# Patient Record
Sex: Male | Born: 1935
Health system: Southern US, Community
[De-identification: ages and names within clinical notes are randomized; demographics above are authoritative.]

## PROBLEM LIST (undated history)

## (undated) DIAGNOSIS — N183 Chronic kidney disease, stage 3 unspecified: Secondary | ICD-10-CM

## (undated) DIAGNOSIS — C20 Malignant neoplasm of rectum: Secondary | ICD-10-CM

## (undated) DIAGNOSIS — N200 Calculus of kidney: Secondary | ICD-10-CM

## (undated) DIAGNOSIS — I219 Acute myocardial infarction, unspecified: Secondary | ICD-10-CM

## (undated) DIAGNOSIS — C649 Malignant neoplasm of unspecified kidney, except renal pelvis: Secondary | ICD-10-CM

## (undated) DIAGNOSIS — M1990 Unspecified osteoarthritis, unspecified site: Secondary | ICD-10-CM

## (undated) DIAGNOSIS — E039 Hypothyroidism, unspecified: Secondary | ICD-10-CM

## (undated) DIAGNOSIS — C61 Malignant neoplasm of prostate: Secondary | ICD-10-CM

## (undated) DIAGNOSIS — I4510 Unspecified right bundle-branch block: Secondary | ICD-10-CM

## (undated) DIAGNOSIS — D649 Anemia, unspecified: Secondary | ICD-10-CM

## (undated) DIAGNOSIS — K222 Esophageal obstruction: Secondary | ICD-10-CM

## (undated) DIAGNOSIS — I350 Nonrheumatic aortic (valve) stenosis: Secondary | ICD-10-CM

## (undated) DIAGNOSIS — I10 Essential (primary) hypertension: Secondary | ICD-10-CM

## (undated) DIAGNOSIS — R0600 Dyspnea, unspecified: Secondary | ICD-10-CM

## (undated) DIAGNOSIS — M810 Age-related osteoporosis without current pathological fracture: Secondary | ICD-10-CM

## (undated) DIAGNOSIS — I340 Nonrheumatic mitral (valve) insufficiency: Secondary | ICD-10-CM

## (undated) DIAGNOSIS — I2510 Atherosclerotic heart disease of native coronary artery without angina pectoris: Secondary | ICD-10-CM

## (undated) HISTORY — DX: Malignant neoplasm of unspecified kidney, except renal pelvis: C64.9

## (undated) HISTORY — PX: HERNIA REPAIR: SHX51

## (undated) HISTORY — DX: Acute myocardial infarction, unspecified: I21.9

## (undated) HISTORY — PX: PROSTATECTOMY: SHX69

## (undated) HISTORY — DX: Unspecified osteoarthritis, unspecified site: M19.90

## (undated) HISTORY — PX: PERCUTANEOUS CORONARY INTERVENTION-BALLOON ONLY: SHX6014

## (undated) HISTORY — DX: Atherosclerotic heart disease of native coronary artery without angina pectoris: I25.10

## (undated) HISTORY — DX: Calculus of kidney: N20.0

## (undated) HISTORY — DX: Esophageal obstruction: K22.2

## (undated) HISTORY — DX: Age-related osteoporosis without current pathological fracture: M81.0

---

## 1967-10-22 DIAGNOSIS — N200 Calculus of kidney: Secondary | ICD-10-CM

## 1967-10-22 HISTORY — DX: Calculus of kidney: N20.0

## 1984-10-21 DIAGNOSIS — C20 Malignant neoplasm of rectum: Secondary | ICD-10-CM

## 1984-10-21 HISTORY — DX: Malignant neoplasm of rectum: C20

## 1984-10-21 HISTORY — PX: COLOSTOMY: SHX63

## 1985-10-21 DIAGNOSIS — C61 Malignant neoplasm of prostate: Secondary | ICD-10-CM

## 1985-10-21 HISTORY — DX: Malignant neoplasm of prostate: C61

## 1998-04-28 ENCOUNTER — Ambulatory Visit (HOSPITAL_COMMUNITY): Admission: RE | Admit: 1998-04-28 | Discharge: 1998-04-28 | Payer: Self-pay | Admitting: Gastroenterology

## 1998-08-02 ENCOUNTER — Encounter: Admission: RE | Admit: 1998-08-02 | Discharge: 1998-10-31 | Payer: Self-pay | Admitting: *Deleted

## 2002-08-18 ENCOUNTER — Encounter: Payer: Self-pay | Admitting: Urology

## 2002-08-25 ENCOUNTER — Observation Stay (HOSPITAL_COMMUNITY): Admission: RE | Admit: 2002-08-25 | Discharge: 2002-08-26 | Payer: Self-pay | Admitting: Urology

## 2002-10-21 HISTORY — PX: KIDNEY SURGERY: SHX687

## 2003-04-04 ENCOUNTER — Encounter: Payer: Self-pay | Admitting: Urology

## 2003-04-04 ENCOUNTER — Observation Stay (HOSPITAL_COMMUNITY): Admission: EM | Admit: 2003-04-04 | Discharge: 2003-04-05 | Payer: Self-pay | Admitting: Emergency Medicine

## 2003-04-04 ENCOUNTER — Encounter: Payer: Self-pay | Admitting: Emergency Medicine

## 2003-04-21 ENCOUNTER — Ambulatory Visit (HOSPITAL_BASED_OUTPATIENT_CLINIC_OR_DEPARTMENT_OTHER): Admission: RE | Admit: 2003-04-21 | Discharge: 2003-04-21 | Payer: Self-pay | Admitting: Urology

## 2003-04-21 ENCOUNTER — Encounter: Payer: Self-pay | Admitting: Urology

## 2003-05-23 ENCOUNTER — Inpatient Hospital Stay (HOSPITAL_COMMUNITY): Admission: RE | Admit: 2003-05-23 | Discharge: 2003-05-27 | Payer: Self-pay | Admitting: Urology

## 2003-05-23 ENCOUNTER — Encounter (INDEPENDENT_AMBULATORY_CARE_PROVIDER_SITE_OTHER): Payer: Self-pay | Admitting: Specialist

## 2003-10-22 DIAGNOSIS — C649 Malignant neoplasm of unspecified kidney, except renal pelvis: Secondary | ICD-10-CM

## 2003-10-22 HISTORY — DX: Malignant neoplasm of unspecified kidney, except renal pelvis: C64.9

## 2004-10-21 HISTORY — PX: RADIOFREQUENCY ABLATION KIDNEY: SHX2292

## 2005-10-21 HISTORY — PX: OTHER SURGICAL HISTORY: SHX169

## 2008-09-14 ENCOUNTER — Ambulatory Visit: Payer: Self-pay | Admitting: Cardiology

## 2008-09-22 ENCOUNTER — Ambulatory Visit: Payer: Self-pay

## 2011-03-05 NOTE — Assessment & Plan Note (Signed)
[facility]                            CARDIOLOGY OFFICE NOTE   Frederick Davis, Frederick Davis                    MRN:          [id number]  DATE:09/14/2008                            DOB:          26-Aug-1923    PRIMARY CARE PHYSICIAN:  Ernestina Penna, MD   REASON FOR PRESENTATION:  Evaluate the patient with cardiovascular risk  factors.   HISTORY OF PRESENT ILLNESS:  The patient is a pleasant 77 year old  gentleman without prior cardiac history.  He does have significant  cardiovascular risk factors.  He is not particularly active.  He does  have some limitations with joint complaints.  However, he does walk when  he golfs.  He will do his chores of daily living and keep up with his  wife when they are out.  With this level of activity, he does not get  any chest pressure, neck or arm discomfort.  He does not have any  palpitations, presyncope, or syncope.  He has no PND or orthopnea.  His  predominant health problems have been cancer.   PAST MEDICAL HISTORY:  Hypertension x10 years, renal cell cancer, rectal  cancer, skin cancer, prostate cancer, nephrolithiasis, renal  insufficiency, and osteoporosis.   PAST SURGICAL HISTORY:  Renal cell cancer status post right nephrectomy,  prostatectomy, rectal cancer status post ostomy, penile implant, hernia  repair.   ALLERGIES:  None.   MEDICATIONS:  1. Norvasc 10 mg daily.  2. Levoxyl 50 mcg daily.  3. Tylenol.  4. Osteo Bi-Flex.  5. Multivitamin.   SOCIAL HISTORY:  The patient is retired.  He is married.  He has four  children.  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   FAMILY HISTORY:  Contributory for his father dying of an aortic aneurysm  at age 83.  There is a long history of hypertension.   REVIEW OF SYSTEMS:  As stated in the HPI, positive for reflux.  Negative  for all other systems.   PHYSICAL EXAMINATION:  GENERAL:  The patient is pleasant and in no  distress.  VITAL SIGNS:  Blood pressure  112/70, heart rate 67 and regular, weight  200 pounds, body mass index 29.  HEENT:  Eyes unremarkable.  Pupils equal, round, and reactive to light.  Fundi within normal limits.  Oral mucosa unremarkable.  NECK:  No  jugular venous distention at 45 degrees.  Carotid upstroke brisk and  symmetrical.  No bruits, no thyromegaly.  LYMPHATICS:  No cervical, axillary, or inguinal adenopathy.  LUNGS:  Clear to auscultation bilaterally.  BACK:  No costovertebral angle tenderness.  CHEST:  Unremarkable.  HEART:  PMI not displaced or sustained.  S1, S2 within normal limits.  No S3, no S4.  No clicks, no rubs, no murmurs.  ABDOMEN:  Flat, positive  bowel sounds normal in frequency and pitch.  No midline pulsatile mass.  Positive midline bruit.  No splenomegaly, no hepatomegaly.  SKIN:  No rashes, no nodules.  EXTREMITIES:  2+ pulses throughout.  No edema, no cyanosis, no clubbing.  NEUROLOGIC:  Oriented to person, place, and time.  Cranial nerves II  through XII grossly intact.  Motor grossly intact.   EKG, sinus rhythm, rate 67, axis within normal limits, intervals within  normal limits, no acute ST-T wave changes.   ASSESSMENT AND PLAN:  1. Hypertension.  The patient's blood pressure is well controlled.  At      this point, no further cardiovascular testing or no further change      to medications is required.  He will continue with the regimen as      listed.  2. Dyslipidemia.  We took a long time to discuss a recent lipid      profile.  I actually gave him an extra copy of his lipids.  He did      have an LDL of 127, though his HDL is 47.  We discussed the risks      and benefits of taking a statin and he will further discuss this      with Dr. Christell Constant.  3. Abdominal bruit.  The patient has this as well as a family history.      He will get an abdominal ultrasound to rule out aneurysm.  4. Hypothyroidism per Dr. Christell Constant.  He recently had his dose adjusted as      his TSH was slightly  elevated.  5. Followup.  I do not think the patient needs any further      cardiovascular testing at this point.  He needs aggressive primary      risk reduction and I will be happy to see him back at any time.     Rollene Rotunda, MD, [facility]  Electronically Signed    JH/MedQ  DD: 09/14/2008  DT: 09/15/2008  Job #: [id number]   cc:   Ernestina Penna, [name]

## 2011-03-08 NOTE — Discharge Summary (Signed)
   NAME:  Frederick Davis, Frederick Davis NO.:  [id number]   MEDICAL RECORD NO.:  [id number]                   PATIENT TYPE:  INP   LOCATION:  0354                                 FACILITY:  [facility]   PHYSICIAN:  Bertram Millard. Dahlstedt, [name]          DATE OF BIRTH:  01/23/1923   DATE OF ADMISSION:  05/23/2003  DATE OF DISCHARGE:  05/27/2003                                 DISCHARGE SUMMARY   PRINCIPAL DIAGNOSES:  1. Renal cell carcinoma.  2. History of rectal carcinoma.  3. History of prostate carcinoma.  4. ED status post IPP placement in 1990.  5. Arthritis.   PRINCIPAL PROCEDURE:  Right nephrectomy, laparoscopic, hand-assisted.   BRIEF HISTORY:  A 77 year old gentleman who is admitted for radical  nephrectomy.  He has a right renal mass which was incidentally found on  evaluation for left-sided pain.  He does have a history of a left renal  calculus which has been treated with lithotripsy and ureteroscopy by Maretta Bees. Vonita Moss, [name]  He presents at this time for laparoscopic hand-assisted  nephrectomy.   HOSPITAL COURSE:  The patient was admitted to the operating suite where he  underwent right radical nephrectomy performed laparoscopically with hand  assist.  He tolerated this procedure well.  He was seen postoperatively for  hypertension and mild renal insufficiency by the hospitalists.  He was  placed on appropriate antihypertensives.  By postoperative day #5 he was  doing well.  Creatinine had stabilized at 1.9 and he was relatively pain-  free.  He was discharged home on Norvasc 2 mg daily, clonidine 0.1 mg b.i.d.  and he will stop this for blood pressure less than 120/60.  He will follow  up for staple removal in approximately one week.  He was discharged on  unrestricted diet except for low salt.  He was also discharged on Vicodin  and Colace.   Pathologic review of the specimen revealed renal cell carcinoma pathologic  stage T1A.  He actually had two  lesions.  The maximum lesion was 3 cm.  He  had a nuclear grade 3/4.                                               Bertram Millard. Dahlstedt, [name]    SMD/MEDQ  D:  07/05/2003  T:  07/05/2003  Job:  [id number]   cc:   Mohammed S. Linna Darner, [name]  [address] [place], [place] [address]

## 2011-03-08 NOTE — Op Note (Signed)
NAME:  Frederick Davis, Frederick Davis NO.:  [id number]   MEDICAL RECORD NO.:  [id number]                   PATIENT TYPE:  AMB   LOCATION:  DAY                                  FACILITY:  [facility]   PHYSICIAN:  Maretta Bees. Vonita Moss, [name]             DATE OF BIRTH:  1923-04-16   DATE OF PROCEDURE:  08/25/2002  DATE OF DISCHARGE:                                 OPERATIVE REPORT   PREOPERATIVE DIAGNOSES:  Impotency and failed penile implant.   POSTOPERATIVE DIAGNOSES:  Impotency and failed penile implant.   PROCEDURE:  Replacement and insertion of new penile prosthesis.   SURGEON:  Maretta Bees. Vonita Moss, [name]   ASSISTANT:  Melvyn Novas, [name]   ANESTHESIA:  General.   INDICATIONS FOR PROCEDURE:  This 77 year old gentleman underwent a radical  retropubic prostatectomy in approximately 1988. He had a penile prosthesis  inserted in 1990. He said it worked for a while but then failed to function  any longer and was disappointed with the loss of length. Preoperative  discussion lead to the conclusion that we would replace this penile  prosthesis with a two piece as opposed to a self contained device.   DESCRIPTION OF PROCEDURE:  The patient was brought to the operating room,  placed in supine position and the external genitalia and lower abdomen given  a 10 minute surgical scrub. A Foley catheter was inserted. A midline  incision was made along the penoscrotal junction and the urethra and both  corpora exposed. The previous self contained implants were removed after  making corporotomies bilaterally and tagging the edges with 2-0 Vicryl. He  then measured 16 cm in length compared to the old prosthesis which was  measured with rear tip extenders at 15 cm. We then implanted an AMS Ambicor  prosthesis with 14 cm in length and 11 mm diameter with a 2 cm rear tip  extended on each side. I should add that the corporal incisions were  irrigated with antibiotic solution. The  prostheses were then placed in with  good position coming all the way out to the glans penis. The tubing  junctions were at the back part of our corporotomies with no pressure or  compression. The corporotomies were closed with interrupted 2-0 Vicryl. A  right scrotal compartment was made to place the pump and it was sutured in  place with 3-0 Chromic. The incision was then closed in two layers with  running 3-0 Chromic catgut and wound cleaned, dressed with dry sterile gauze  dressing after putting on a collodion topical dressing. The Foley catheter  was connected to close drainage. He tolerated the procedure well. Estimated  blood loss was less than 25 cc.  Maretta Bees. Vonita Moss, [name]    LJP/MEDQ  D:  08/25/2002  T:  08/25/2002  Job:  [id number]

## 2011-03-08 NOTE — Consult Note (Signed)
NAME:  Frederick Davis, Frederick Davis NO.:  [id number]   MEDICAL RECORD NO.:  [id number]                   PATIENT TYPE:  OBV   LOCATION:  0344                                 FACILITY:  [facility]   PHYSICIAN:  Maretta Bees. Vonita Moss, [name]             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  Also there was CT and ultrasound evidence of  a 3 cm right renal mass that was either a complex cyst or a solid tumor.  He  has made a couple of other trips to [facility], and now for a fourth  ER visit with this recurrent left flank pain.  He was evaluated, a KUB was  done, and there was suspicion of a stone at the level of the iliac crest.  Because of his severe episodic intermittent pains, he was admitted today for  pain control and cystoscopy and ureteroscopy with the possibility of holmium  laser therapy for stone manipulation.  He and his wife were also told about  double J catheter placement.   PAST HISTORY:  Radical prostatectomy performed at [facility]  several years ago with prostatic carcinoma.  He also has had insertion and  revision of a penile implant for postoperative impotency.  He has also had  AP resection for rectal carcinoma and has a colostomy.   He recently was found to have some hypertension.  He also has a history of  kidney stones in the past.   MEDICATIONS:  1. Tenormin which was recently started, but may be causing him some side     effects.  2. He has also been on some Naprosyn.   ALLERGIES:  Allergies to drugs are denied.   SOCIAL HISTORY:  Does not drink alcohol, and has never used tobacco  products.   PHYSICAL EXAMINATION:  VITAL SIGNS:  Blood pressure 155/77, pulse 56,  respiratory rate 20, temperature 98.3.  GENERAL:  He is alert and oriented.  No respiratory distress noted.  SKIN:  Warm and dry.  ABDOMEN:  Soft and nontender with a colostomy in the right lower quadrant.  GENITALIA:  A penile implant which is deflated at this time.  Testicles are  unremarkable.  Scrotum and urethral meatus are unremarkable.  RECTAL:  Not done because of his AP resection.   IMPRESSION:  1. Left ureteral calculus requiring admission therapy.  2. Worrisome right renal mass.  3. History of prostatic carcinoma.  4.     History of rectal carcinoma.  5. Impotency.  6. Hypertension.  Maretta Bees. Vonita Moss, [name]    LJP/MEDQ  D:  04/04/2003  T:  04/04/2003  Job:  [id number]   cc:   Markham Jordan L. Effie Shy, [name]  [address]  [place] [address]  Fax: [phone]

## 2011-03-08 NOTE — Op Note (Signed)
NAME:  RAMSEY, GUADAMUZ NO.:  [id number]   MEDICAL RECORD NO.:  [id number]                   PATIENT TYPE:  INP   LOCATION:  X004                                 FACILITY:  [facility]   PHYSICIAN:  Bertram Millard. Dahlstedt, [name]          DATE OF BIRTH:  1923/01/16   DATE OF PROCEDURE:  05/23/2003  DATE OF DISCHARGE:                                 OPERATIVE REPORT   PREOPERATIVE DIAGNOSIS:  Right renal mass.   POSTOPERATIVE DIAGNOSIS:  Right renal mass.   PRINCIPAL PROCEDURE:  Hand assisted right laparoscopic radical nephrectomy.   SURGEON:  Bertram Millard. Dahlstedt, [name]   FIRST ASSISTANT:  Susanne Borders, MD   ANESTHESIA:  General endotracheal.   COMPLICATIONS:  None.   BRIEF HISTORY:  This 77 year old gentleman is a patient of Dr. Enos Fling.  He has been treated for left renal calculus over the past few months. He  underwent a lithotripsy of a left ureteral stone over two months ago. This  was followed up with a left ureteroscopy of non-passed fragments. On  evaluation, the patient was found to have an incidental 3 cm right renal  mass. This was solid and enhancing. It was deep within the right renal  parenchyma.   The patient was counselled by Dr. Vonita Moss. It was felt that resection of  this mass with the kidney is the most appropriate treatment. The patient is  interested in minimally invasive surgery. He was referred to me for  consideration of a laparoscopic nephrectomy. The patient does have an  extensive GU history including prostate cancer. Status post radical  prostatectomy and has an implant in place. Additionally, he has had a left  colectomy with colostomy for a rectal cancer. He presents at this time for  his surgery. He is aware of the risks and complications including but not  limited to need to open the procedure because of scarring or bleeding, loss  of blood, infection, injury to surrounding bowel, liver, vessels. He desires  to  proceed.   DESCRIPTION OF PROCEDURE:  The patient was administered preoperative IV  antibiotics and taken to the operating room where general anesthetic was  administered. The patient was placed on a beanbag in the supine position  with a bump under his right flank. His entire abdomen was prepped and  draped. His bladder had been drained with an indwelling Foley catheter.  Ioban draped was placed over top of the patient's abdomen due to the  colostomy presence in the left lower quadrant. An incision was made  approximately 3 cm below and 2 cm above the umbilicus in the midline. The  peritoneum was then entered. There were very few adhesions found within the  abdomen. Three separate ports were placed using 12 mm trocars. One was in  the right upper quadrant, two just to the left of midline in the upper  abdomen. The lap disk was then placed in the lower abdominal incision.  The  right colon was then mobilized, including the hepatic flexure. The  gallbladder was decompressed. The liver was somewhat low in the abdomen. It  was retracted superiorly. Dissection was carried around the superior and  lateral aspect of the right colon, thus reflecting it nicely. The duodenum  was kocherized. There were quite a few adhesions between the peritoneum and  the lower edge of the liver. Dissection was then carried out inferior to the  kidney. The fatty tissue was then transected. The harmonic scalpel was used  for this. The gonadal vein and ureter were identified, doubly clipped and  ligated. Dissection was then carried superiorly along the medial aspect of  the kidney. First the right renal vein and then the renal artery were  encountered. The small anterior segment of the artery was clipped and  divided. There was a small amount of venous bleeding just superior to the  small arterial branch. Because of this, I felt it would be worthwhile to  staple the artery and vein together. The endoGIA stapler was  used for this.  Good hemostasis was achieved. Dissection was then carried medially up to the  inferior edge of the adrenal which was kept in place. I then mobilized the  posterior, lateral and superior aspects of the kidney outside of Gerota's  fascia using the harmonic scalpel. Small bleeders were controlled with the  harmonic scalpel. The kidney was then freed up and removed from the patient.  The bed of the kidney was identified. There was no active bleeding. The  lower edge of the liver was identified, no bleeding was seen. The  gallbladder was intact. The renal hilum where the clips had been placed were  identified and was hemostatic. At this point, the bed was irrigated, a lap  pad was used to dry it up and the colon was replaced in its anatomic  position. All trocars were removed under direct vision. No bleeding was  seen. Trocar sites were closed with skin staples. The midline incision was  closed with a running #1 PDS. The wound in this area was then also closed  with staples. Dry sterile dressings were placed.   The patient tolerated the procedure well. He was taken to PACU in stable  condition. Sponge, needle and instrument counts were correct x2.                                               Bertram Millard. Dahlstedt, [name]    SMD/MEDQ  D:  05/23/2003  T:  05/23/2003  Job:  [id number]   cc:   Dr. _____________________  [address]  [place], [place] [address]

## 2011-03-08 NOTE — Op Note (Signed)
NAME:  Frederick Davis, GOLLA NO.:  [id number]   MEDICAL RECORD NO.:  [id number]                   PATIENT TYPE:  OBV   LOCATION:  0344                                 FACILITY:  [facility]   PHYSICIAN:  Maretta Bees. Vonita Moss, [name]             DATE OF BIRTH:  October 13, 1923   DATE OF PROCEDURE:  04/04/2003  DATE OF DISCHARGE:                                 OPERATIVE REPORT   PREOPERATIVE DIAGNOSIS:  Left ureteral calculus.   POSTOPERATIVE DIAGNOSIS:  Left ureteral calculus.   OPERATION/PROCEDURE:  1. Cystoscopy.  2. Left retrograde pyelogram with interpretation.  3. Left ureteroscopy.  4. Left double J catheter insertion.   SURGEON:  Maretta Bees. Vonita Moss, [name]   ANESTHESIA:  General.   INDICATIONS:  This 77 year old gentleman has had past history of stones and  has been into the emergency room at [place], [place] at [facility] three times in the last couple of weeks and presented to [facility] Emergency Room today.  His outside CT scan was worrisome for a right  renal mass versus a complex cyst but also had hydronephrosis due to a 5 mm  stone in the left ureter.  He also has history of radical prostatectomy for  prostate carcinoma and AP resection for rectal carcinoma.  He has a penile  implant placed.   The patient wished to relief of these recurrent episodes of severe pain.   DESCRIPTION OF PROCEDURE:  The patient was brought to the operating room and  placed in the lithotomy position.  External genitalia were prepped and  draped in the usual fashion.  He was cystoscoped and the anterior urethra  was unremarkable except for the presence of penile implants, but there were  no mucosal lesions.  The prostate was absent.  At first I could not  visualize the ureteral orifices very well and injected some indigo carmine,  but then they were both identified and a guide wire was placed up the left  ureteral orifice.  It curved medially so I put in an  open-ended ureteral  catheter, injected contrast and the distal ureter was sharply angulated  medially which I feel was definitely due to his AP resection.  At this point  the distal ureter was not obstructed appearing, but there was some  pyelocaliectasis.  I then was able to put up a glide wire and advanced the  open-ended ureteral catheter with that and then put up the metal guide wire  and with the metal guide wire in place, I inserted the 6-French rigid  ureteroscope and negotiated up to the point where the ureter angulated  medially.  I felt it was inappropriate at this point, since it still did not  advance very easily, to proceed further and risk any ureteral injury.  Therefore, I removed the ureteroscope and backloaded the guide wire into an  open-ended catheter and inserted a 6-French 28 cm double  J catheter which  cured nicely in the renal pelvis and a full coil in the bladder.  There was  no string attached.  It was felt that this double J catheter in place may  help soften and straighten out the distal ureter and even allow him to  pass the stone.  If not, hopefully typically make a repeat ureteroscopic  procedure easier.  At this point the bladder was emptied and cystoscope  removed and the patient sent to the recovery room in good condition having  tolerated the procedure well.                                                Maretta Bees. Vonita Moss, [name]    LJP/MEDQ  D:  04/04/2003  T:  04/04/2003  Job:  [id number]

## 2011-03-08 NOTE — H&P (Signed)
NAME:  Frederick Davis, Frederick Davis NO.:  [id number]   MEDICAL RECORD NO.:  [id number]                   PATIENT TYPE:  INP   LOCATION:  X004                                 FACILITY:  [facility]   PHYSICIAN:  Bertram Millard. Dahlstedt, [name]          DATE OF BIRTH:  November 08, 1922   DATE OF ADMISSION:  05/23/2003  DATE OF DISCHARGE:                                HISTORY & PHYSICAL   REASON FOR ADMISSION:  Right renal mass.   HISTORY OF PRESENT ILLNESS:  This 77 year old gentleman was admitted for a  right radical nephrectomy. This is for a right renal mass, which was found  incidentally on evaluation for left flank pain. The patient does have a  history of a left renal calculus, which was treated with lithotripsy and  ureteroscopy. He now presents for surgery. He has no evidence of metastatic  disease. He presented to my [facility] for [facility] of a hand-assisted laparoscopic nephrectomy. The  patient prefers minimally invasive surgery. He is aware of surgical risks  and desires to proceed.   PAST MEDICAL HISTORY:  1. Status post AP resection for rectal carcinoma.  2. Status post radical prostatectomy for prostate cancer without evidence of     recurrence.  3. Status post placement of an inflatable penile prosthesis in 1990 for     treatment of erectile dysfunction.  4. Arthritis.   PAST SURGICAL HISTORY:  1. He has had both eyes operated on for cataracts.  2. He has had a stone removal in 1969.   MEDICATIONS:  Include only occasional Tylenol.   SOCIAL HISTORY:  This patient is married and has children. He quit smoking  34 years ago. He rarely drinks alcohol. He is retired.   ALLERGIES:  NO KNOWN DRUG ALLERGIES.   FAMILY HISTORY:  Noncontributory.   REVIEW OF SYSTEMS:  Noncontributory.   PHYSICAL EXAMINATION:  GENERAL: A very pleasant, healthy appearing, elderly  male.  VITAL SIGNS: Blood pressure 178/98, pulse  58, respiratory rate 16,  temperature 97.8.  HEENT: Normal.  NECK: Supple without thyromegaly or adenopathy.  CHEST: Clear bilaterally.  HEART: Regular rate and rhythm.  ABDOMEN: Colostomy present in left lower quadrant. He had a well healed  midline scar without hernia.  GU: External genitalia consistent with penile prosthesis.  RECTAL: Cannot be performed.  EXTREMITIES: Without clubbing, cyanosis, or edema.   LABORATORY DATA:  Admission laboratories were normal.   Chest x-ray was normal.   EKG first degree AV block.   IMPRESSION:  1. Right renal mass for radical nephrectomy.  2. History of rectal cancer, status post abdominal perineal resection. No     evidence of recurrence.  3. Prostate cancer, status post radical prostatectomy. No evidence of     recurrence.   PLAN:  Admit for laparoscopic radical nephrectomy.  Bertram Millard. Dahlstedt, [name]    SMD/MEDQ  D:  05/23/2003  T:  05/23/2003  Job:  [id number]   cc:   Sharlene Dory. Linna Darner, [name]  [address]  [name], [place]. [address]

## 2011-10-08 ENCOUNTER — Encounter (INDEPENDENT_AMBULATORY_CARE_PROVIDER_SITE_OTHER): Payer: Self-pay | Admitting: *Deleted

## 2011-10-23 ENCOUNTER — Other Ambulatory Visit: Payer: Self-pay | Admitting: Dermatology

## 2011-10-23 DIAGNOSIS — C44621 Squamous cell carcinoma of skin of unspecified upper limb, including shoulder: Secondary | ICD-10-CM | POA: Diagnosis not present

## 2011-10-24 DIAGNOSIS — R6889 Other general symptoms and signs: Secondary | ICD-10-CM | POA: Diagnosis not present

## 2011-10-28 DIAGNOSIS — I10 Essential (primary) hypertension: Secondary | ICD-10-CM | POA: Diagnosis not present

## 2011-10-28 DIAGNOSIS — E039 Hypothyroidism, unspecified: Secondary | ICD-10-CM | POA: Diagnosis not present

## 2011-10-28 DIAGNOSIS — N289 Disorder of kidney and ureter, unspecified: Secondary | ICD-10-CM | POA: Diagnosis not present

## 2011-10-30 DIAGNOSIS — N390 Urinary tract infection, site not specified: Secondary | ICD-10-CM | POA: Diagnosis not present

## 2011-11-14 DIAGNOSIS — N390 Urinary tract infection, site not specified: Secondary | ICD-10-CM | POA: Diagnosis not present

## 2011-11-21 ENCOUNTER — Telehealth (INDEPENDENT_AMBULATORY_CARE_PROVIDER_SITE_OTHER): Payer: Self-pay | Admitting: *Deleted

## 2011-11-21 ENCOUNTER — Other Ambulatory Visit (INDEPENDENT_AMBULATORY_CARE_PROVIDER_SITE_OTHER): Payer: Self-pay | Admitting: *Deleted

## 2011-11-21 DIAGNOSIS — Z86010 Personal history of colonic polyps: Secondary | ICD-10-CM

## 2011-11-21 DIAGNOSIS — Z85038 Personal history of other malignant neoplasm of large intestine: Secondary | ICD-10-CM

## 2011-11-21 NOTE — Telephone Encounter (Signed)
Patient need movi prep 

## 2011-11-22 MED ORDER — PEG-KCL-NACL-NASULF-NA ASC-C 100 G PO SOLR: 1.0000 | Freq: Once | ORAL | Status: DC

## 2011-12-18 ENCOUNTER — Telehealth (INDEPENDENT_AMBULATORY_CARE_PROVIDER_SITE_OTHER): Payer: Self-pay | Admitting: *Deleted

## 2011-12-18 NOTE — Telephone Encounter (Signed)
agree

## 2011-12-18 NOTE — Telephone Encounter (Signed)
Requesting MD/PCP:  moore     Name & DOB: Frederick Davis 01/01/1923     Procedure: TCS  Reason/Indication:  Hx colon ca, hx polyps  Has patient had this procedure before?  yes  If so, when, by whom and where?  12/09 (in paper chart)  Is there a family history of colon cancer?  no  Who?  What age when diagnosed?    Is patient diabetic?   no      Does patient have prosthetic heart valve?  no  Do you have a pacemaker?  no  Has patient had joint replacement within last 12 months?  no  Is patient on Coumadin, Plavix and/or Aspirin? no  Medications: norvasc 10 mg daily, levothyroxine 75 mcg daily, vit d 2000 mg daily, calcium, glucosamine, multi vit  Allergies: nkda  Pharmacy: [facility]  Medication Adjustment: none  Procedure date & time: 01/08/12 at 1030

## 2012-01-03 ENCOUNTER — Encounter (HOSPITAL_COMMUNITY): Payer: Self-pay | Admitting: Pharmacy Technician

## 2012-01-06 ENCOUNTER — Telehealth (INDEPENDENT_AMBULATORY_CARE_PROVIDER_SITE_OTHER): Payer: Self-pay | Admitting: *Deleted

## 2012-01-06 MED ORDER — PEG-KCL-NACL-NASULF-NA ASC-C 100 G PO SOLR: 1.0000 | Freq: Once | ORAL | Status: DC

## 2012-01-06 NOTE — Telephone Encounter (Signed)
Patient states pharmacy didn't receive prep kit rx, please resend. His TCS is sch'd 3/20

## 2012-01-07 MED ORDER — SODIUM CHLORIDE 0.45 % IV SOLN
Freq: Once | INTRAVENOUS | Status: AC
  Administered 2012-01-08: 10:00:00 via INTRAVENOUS

## 2012-01-08 ENCOUNTER — Encounter (HOSPITAL_COMMUNITY): Payer: Self-pay

## 2012-01-08 ENCOUNTER — Ambulatory Visit (HOSPITAL_COMMUNITY)
Admission: RE | Admit: 2012-01-08 | Discharge: 2012-01-08 | Disposition: A | Discharge status: Home / Self Care | Payer: Medicare Other | Source: Ambulatory Visit | Attending: Internal Medicine | Admitting: Internal Medicine

## 2012-01-08 ENCOUNTER — Encounter (HOSPITAL_COMMUNITY)
Admission: RE | Disposition: A | Discharge status: Home / Self Care | Payer: Self-pay | Source: Ambulatory Visit | Attending: Internal Medicine

## 2012-01-08 DIAGNOSIS — Z09 Encounter for follow-up examination after completed treatment for conditions other than malignant neoplasm: Secondary | ICD-10-CM | POA: Insufficient documentation

## 2012-01-08 DIAGNOSIS — I10 Essential (primary) hypertension: Secondary | ICD-10-CM | POA: Insufficient documentation

## 2012-01-08 DIAGNOSIS — Z933 Colostomy status: Secondary | ICD-10-CM | POA: Diagnosis not present

## 2012-01-08 DIAGNOSIS — Z8546 Personal history of malignant neoplasm of prostate: Secondary | ICD-10-CM | POA: Diagnosis not present

## 2012-01-08 DIAGNOSIS — D126 Benign neoplasm of colon, unspecified: Secondary | ICD-10-CM | POA: Insufficient documentation

## 2012-01-08 DIAGNOSIS — Z85038 Personal history of other malignant neoplasm of large intestine: Secondary | ICD-10-CM

## 2012-01-08 DIAGNOSIS — K5730 Diverticulosis of large intestine without perforation or abscess without bleeding: Secondary | ICD-10-CM

## 2012-01-08 DIAGNOSIS — Z85048 Personal history of other malignant neoplasm of rectum, rectosigmoid junction, and anus: Secondary | ICD-10-CM | POA: Insufficient documentation

## 2012-01-08 DIAGNOSIS — Z86010 Personal history of colonic polyps: Secondary | ICD-10-CM

## 2012-01-08 HISTORY — DX: Essential (primary) hypertension: I10

## 2012-01-08 HISTORY — PX: COLONOSCOPY: SHX5424

## 2012-01-08 HISTORY — DX: Hypothyroidism, unspecified: E03.9

## 2012-01-08 HISTORY — DX: Malignant neoplasm of rectum: C20

## 2012-01-08 HISTORY — DX: Malignant neoplasm of prostate: C61

## 2012-01-08 SURGERY — COLONOSCOPY
Anesthesia: Moderate Sedation

## 2012-01-08 MED ORDER — MIDAZOLAM HCL 5 MG/5ML IJ SOLN
INTRAMUSCULAR | Status: AC
  Filled 2012-01-08: qty 10

## 2012-01-08 MED ORDER — MEPERIDINE HCL 50 MG/ML IJ SOLN
INTRAMUSCULAR | Status: AC
  Filled 2012-01-08: qty 1

## 2012-01-08 MED ORDER — MEPERIDINE HCL 50 MG/ML IJ SOLN
INTRAMUSCULAR | Status: DC | PRN
  Administered 2012-01-08: 25 mg via INTRAVENOUS

## 2012-01-08 MED ORDER — MIDAZOLAM HCL 5 MG/5ML IJ SOLN
INTRAMUSCULAR | Status: DC | PRN
  Administered 2012-01-08 (×2): 1 mg via INTRAVENOUS

## 2012-01-08 NOTE — Op Note (Signed)
COLONOSCOPY PROCEDURE REPORT  PATIENT:  Frederick Davis  MR#:  [id number] Birthdate:  [birth year], 78 y.o., male Endoscopist:  Dr. Malissa Hippo, MD Referred By:  Dr. Ernestina Penna, [name]  Procedure Date: 01/08/2012  Procedure:   Colonoscopy  Indications:  Patient is an 78 year old Caucasian male who is here for surveillance colonoscopy. He had APR in 1986 and has done well. He is in excellent health. Last exam was in 2009.  Informed Consent:  The procedure and risks were reviewed with the patient and informed consent was obtained.  Medications:  Demerol 25 mg IV Versed 2 mg IV  Description of procedure:  After  digital colostomy exam was performed, that colonoscope was advanced via colostomy colon to the area of the cecum, ileocecal valve and appendiceal orifice. The cecum was deeply intubated. These structures were well-seen and photographed for the record. From the level of the cecum and ileocecal valve, the scope was slowly and cautiously withdrawn. The mucosal surfaces were carefully surveyed utilizing scope tip to flexion to facilitate fold flattening as needed.   Findings:   Prep satisfactory. Two small cecal polyps ablated via cold biopsy and submitted in one container. Few small diverticula appeared within 20 cm of colostomy.   Therapeutic/Diagnostic Maneuvers Performed:  See above  Complications:  None  Cecal Withdrawal Time:  12 minutes  Impression:  Examination performed via colostomy to cecum. Two small left-sided diverticula. Two small cecal polyps ablated via cold biopsy and submitted in one container.  Recommendations:  Standard instructions given. I will be contacting patient with biopsy results.  Ashliegh Parekh U  01/08/2012 10:53 AM  CC: Dr. [name], MD, MD & Dr. Bonnetta Barry ref. provider found

## 2012-01-08 NOTE — H&P (Signed)
Frederick Davis is an 78 y.o. male.   Chief Complaint: Patient is here for colonoscopy. HPI: This is an 78 year old Caucasian male who is here for surveillance colonoscopy. Had APR in 1986 and has done well. He denies abdominal pain melena or bleeding into the colostomy. He has good appetite.  Past Medical History  Diagnosis Date  . Hypothyroidism   . Hypertension   . Rectum cancer 1986  . PC (prostate cancer) 1987    Past Surgical History  Procedure Date  . Kidney surgery 2004  . Radiofrequency ablation kidney 2006  . Hernia repair     2007  . Renal sphincter appliance 2007    History reviewed. No pertinent family history. Social History:  reports that he has never smoked. He does not have any smokeless tobacco history on file. He reports that he does not drink alcohol or use illicit drugs.  Allergies: No Known Allergies  Medications Prior to Admission  Medication Dose Route Frequency Provider Last Rate Last Dose  . 0.45 % sodium chloride infusion   Intravenous Once Malissa Hippo, MD 20 mL/hr at 01/08/12 1001    . meperidine (DEMEROL) 50 MG/ML injection           . midazolam (VERSED) 5 MG/5ML injection            Medications Prior to Admission  Medication Sig Dispense Refill  . amLODipine (NORVASC) 10 MG tablet Take 10 mg by mouth daily.      Marland Kitchen Bioflavonoid Products (BIOFLEX) TABS Take 1 tablet by mouth daily.      . cholecalciferol (VITAMIN D) 1000 UNITS tablet Take 2,000 Units by mouth daily.      Marland Kitchen levothyroxine (SYNTHROID, LEVOTHROID) 75 MCG tablet Take 75 mcg by mouth daily.      . Multiple Vitamin (MULITIVITAMIN WITH MINERALS) TABS Take 1 tablet by mouth daily.        No results found for this or any previous visit (from the past 48 hour(s)). No results found.  Review of Systems  Constitutional: Weight loss: Voluntary weight loss of 10 pounds this year.  Gastrointestinal: Negative for abdominal pain, diarrhea, constipation, blood in stool and melena.     Blood pressure 133/78, pulse 73, temperature 97.7 F (36.5 C), temperature source Oral, resp. rate 17, height 5' 7.5" (1.715 m), weight 172 lb (78.019 kg), SpO2 99.00%. Physical Exam  Constitutional: He appears well-developed and well-nourished.  HENT:  Mouth/Throat: Oropharynx is clear and moist.  Eyes: Conjunctivae are normal. No scleral icterus.  Neck: No thyromegaly present.  GI: Soft. He exhibits no mass. There is no tenderness.       Colostomy at [facility]  Musculoskeletal: He exhibits no edema.  Lymphadenopathy:    He has no cervical adenopathy.  Neurological: He is alert.  Skin: Skin is warm and dry.     Assessment/Plan History of rectal CA. Status post APR in 1986. Surveillance colonoscopy  Frederick Davis 01/08/2012, 10:28 AM

## 2012-01-08 NOTE — Discharge Instructions (Signed)
Resume usual medications and diet. No driving for [id number]. Physician will contact you with biopsy results.   Colonoscopy Care After Read the instructions outlined below and refer to this sheet in the next few weeks. These discharge instructions provide you with general information on caring for yourself after you leave the hospital. Your doctor may also give you specific instructions. While your treatment has been planned according to the most current medical practices available, unavoidable complications occasionally occur. If you have any problems or questions after discharge, call your doctor. HOME CARE INSTRUCTIONS ACTIVITY:  You may resume your regular activity, but move at a slower pace for the next 24 hours.   Take frequent rest periods for the next 24 hours.   Walking will help get rid of the air and reduce the bloated feeling in your belly (abdomen).   No driving for 24 hours (because of the medicine (anesthesia) used during the test).   You may shower.   Do not sign any important legal documents or operate any machinery for 24 hours (because of the anesthesia used during the test).  NUTRITION:  Drink plenty of fluids.   You may resume your normal diet as instructed by your doctor.   Begin with a light meal and progress to your normal diet. Heavy or fried foods are harder to digest and may make you feel sick to your stomach (nauseated).   Avoid alcoholic beverages for 24 hours or as instructed.  MEDICATIONS:  You may resume your normal medications unless your doctor tells you otherwise.  WHAT TO EXPECT TODAY:  Some feelings of bloating in the abdomen.   Passage of more gas than usual.   Spotting of blood in your stool or on the toilet paper.  IF YOU HAD POLYPS REMOVED DURING THE COLONOSCOPY:  No aspirin products for 7 days or as instructed.   No alcohol for 7 days or as instructed.   Eat a soft diet for the next 24 hours.  FINDING OUT THE RESULTS OF YOUR  TEST Not all test results are available during your visit. If your test results are not back during the visit, make an appointment with your caregiver to find out the results. Do not assume everything is normal if you have not heard from your caregiver or the medical facility. It is important for you to follow up on all of your test results.  SEEK IMMEDIATE MEDICAL CARE IF:  You have more than a spotting of blood in your stool.   Your belly is swollen (abdominal distention).   You are nauseated or vomiting.   You have a fever.   You have abdominal pain or discomfort that is severe or gets worse throughout the day.  Document Released: 05/21/2004 Document Revised: 09/26/2011 Document Reviewed: 05/19/2008 [facility] Patient Information 2012 [place], [place].Diverticulosis Diverticulosis is a common condition that develops when small pouches (diverticula) form in the wall of the colon. The risk of diverticulosis increases with age. It happens more often in people who eat a low-fiber diet. Most individuals with diverticulosis have no symptoms. Those individuals with symptoms usually experience abdominal pain, constipation, or loose stools (diarrhea). HOME CARE INSTRUCTIONS   Increase the amount of fiber in your diet as directed by your caregiver or dietician. This may reduce symptoms of diverticulosis.   Your caregiver may recommend taking a dietary fiber supplement.   Drink at least 6 to 8 glasses of water each day to prevent constipation.   Try not to strain when you have  a bowel movement.   Your caregiver may recommend avoiding nuts and seeds to prevent complications, although this is still an uncertain benefit.   Only take over-the-counter or prescription medicines for pain, discomfort, or fever as directed by your caregiver.  FOODS WITH HIGH FIBER CONTENT INCLUDE:  Fruits. Apple, peach, pear, tangerine, raisins, prunes.   Vegetables. Brussels sprouts, asparagus, broccoli, cabbage,  carrot, cauliflower, romaine lettuce, spinach, summer squash, tomato, winter squash, zucchini.   Starchy Vegetables. Baked beans, kidney beans, lima beans, split peas, lentils, potatoes (with skin).   Grains. Whole wheat bread, brown rice, bran flake cereal, plain oatmeal, white rice, shredded wheat, bran muffins.  SEEK IMMEDIATE MEDICAL CARE IF:   You develop increasing pain or severe bloating.   You have an oral temperature above 102 F (38.9 C), not controlled by medicine.   You develop vomiting or bowel movements that are bloody or black.  Document Released: 07/04/2004 Document Revised: 09/26/2011 Document Reviewed: 03/07/2010 [facility] Patient Information 2012 [place], [place].

## 2012-01-10 ENCOUNTER — Encounter (HOSPITAL_COMMUNITY): Payer: Self-pay | Admitting: Internal Medicine

## 2012-01-27 ENCOUNTER — Encounter (INDEPENDENT_AMBULATORY_CARE_PROVIDER_SITE_OTHER): Payer: Self-pay | Admitting: *Deleted

## 2012-02-20 DIAGNOSIS — E785 Hyperlipidemia, unspecified: Secondary | ICD-10-CM | POA: Diagnosis not present

## 2012-02-20 DIAGNOSIS — I10 Essential (primary) hypertension: Secondary | ICD-10-CM | POA: Diagnosis not present

## 2012-02-20 DIAGNOSIS — E559 Vitamin D deficiency, unspecified: Secondary | ICD-10-CM | POA: Diagnosis not present

## 2012-02-25 DIAGNOSIS — L570 Actinic keratosis: Secondary | ICD-10-CM | POA: Diagnosis not present

## 2012-02-25 DIAGNOSIS — Z85828 Personal history of other malignant neoplasm of skin: Secondary | ICD-10-CM | POA: Diagnosis not present

## 2012-02-25 DIAGNOSIS — L821 Other seborrheic keratosis: Secondary | ICD-10-CM | POA: Diagnosis not present

## 2012-02-26 DIAGNOSIS — C649 Malignant neoplasm of unspecified kidney, except renal pelvis: Secondary | ICD-10-CM | POA: Diagnosis not present

## 2012-02-26 DIAGNOSIS — Z1289 Encounter for screening for malignant neoplasm of other sites: Secondary | ICD-10-CM | POA: Diagnosis not present

## 2012-03-02 DIAGNOSIS — D649 Anemia, unspecified: Secondary | ICD-10-CM | POA: Diagnosis not present

## 2012-03-02 DIAGNOSIS — R7989 Other specified abnormal findings of blood chemistry: Secondary | ICD-10-CM | POA: Diagnosis not present

## 2012-03-02 DIAGNOSIS — Z79899 Other long term (current) drug therapy: Secondary | ICD-10-CM | POA: Diagnosis not present

## 2012-03-04 DIAGNOSIS — Z1212 Encounter for screening for malignant neoplasm of rectum: Secondary | ICD-10-CM | POA: Diagnosis not present

## 2012-03-27 ENCOUNTER — Encounter (INDEPENDENT_AMBULATORY_CARE_PROVIDER_SITE_OTHER): Payer: Self-pay

## 2012-04-16 DIAGNOSIS — R7989 Other specified abnormal findings of blood chemistry: Secondary | ICD-10-CM | POA: Diagnosis not present

## 2012-06-16 DIAGNOSIS — E039 Hypothyroidism, unspecified: Secondary | ICD-10-CM | POA: Diagnosis not present

## 2012-06-16 DIAGNOSIS — I10 Essential (primary) hypertension: Secondary | ICD-10-CM | POA: Diagnosis not present

## 2012-06-18 ENCOUNTER — Ambulatory Visit: Payer: Medicare Other | Admitting: Physical Therapy

## 2012-06-18 DIAGNOSIS — I10 Essential (primary) hypertension: Secondary | ICD-10-CM | POA: Diagnosis not present

## 2012-06-18 DIAGNOSIS — E785 Hyperlipidemia, unspecified: Secondary | ICD-10-CM | POA: Diagnosis not present

## 2012-06-18 DIAGNOSIS — E039 Hypothyroidism, unspecified: Secondary | ICD-10-CM | POA: Diagnosis not present

## 2012-06-18 DIAGNOSIS — E559 Vitamin D deficiency, unspecified: Secondary | ICD-10-CM | POA: Diagnosis not present

## 2012-06-18 DIAGNOSIS — D649 Anemia, unspecified: Secondary | ICD-10-CM | POA: Diagnosis not present

## 2012-06-23 ENCOUNTER — Ambulatory Visit: Payer: Medicare Other | Attending: Family Medicine | Admitting: Physical Therapy

## 2012-06-23 DIAGNOSIS — R5381 Other malaise: Secondary | ICD-10-CM | POA: Insufficient documentation

## 2012-06-23 DIAGNOSIS — M25569 Pain in unspecified knee: Secondary | ICD-10-CM | POA: Diagnosis not present

## 2012-06-23 DIAGNOSIS — IMO0001 Reserved for inherently not codable concepts without codable children: Secondary | ICD-10-CM | POA: Diagnosis not present

## 2012-08-11 DIAGNOSIS — S8990XA Unspecified injury of unspecified lower leg, initial encounter: Secondary | ICD-10-CM | POA: Diagnosis not present

## 2012-08-13 DIAGNOSIS — S8990XA Unspecified injury of unspecified lower leg, initial encounter: Secondary | ICD-10-CM | POA: Diagnosis not present

## 2012-08-13 DIAGNOSIS — S99929A Unspecified injury of unspecified foot, initial encounter: Secondary | ICD-10-CM | POA: Diagnosis not present

## 2012-08-13 DIAGNOSIS — Z23 Encounter for immunization: Secondary | ICD-10-CM | POA: Diagnosis not present

## 2012-08-25 DIAGNOSIS — L570 Actinic keratosis: Secondary | ICD-10-CM | POA: Diagnosis not present

## 2012-08-25 DIAGNOSIS — L821 Other seborrheic keratosis: Secondary | ICD-10-CM | POA: Diagnosis not present

## 2012-09-23 DIAGNOSIS — N189 Chronic kidney disease, unspecified: Secondary | ICD-10-CM | POA: Diagnosis not present

## 2012-09-23 DIAGNOSIS — D649 Anemia, unspecified: Secondary | ICD-10-CM | POA: Diagnosis not present

## 2012-10-02 DIAGNOSIS — I10 Essential (primary) hypertension: Secondary | ICD-10-CM | POA: Diagnosis not present

## 2012-10-02 DIAGNOSIS — E785 Hyperlipidemia, unspecified: Secondary | ICD-10-CM | POA: Diagnosis not present

## 2012-10-02 DIAGNOSIS — E559 Vitamin D deficiency, unspecified: Secondary | ICD-10-CM | POA: Diagnosis not present

## 2012-10-02 LAB — CBC AND DIFFERENTIAL
HCT: 36 % — AB (ref 41–53)
Hemoglobin: 12 g/dL — AB (ref 13.5–17.5)

## 2012-10-02 LAB — LIPID PANEL
Cholesterol: 149 mg/dL (ref 0–200)
HDL: 56 mg/dL (ref 35–70)
LDL Cholesterol: 81 mg/dL
Triglycerides: 62 mg/dL (ref 40–160)

## 2012-10-02 LAB — BASIC METABOLIC PANEL: Sodium: 139 mmol/L (ref 137–147)

## 2012-11-25 ENCOUNTER — Other Ambulatory Visit: Payer: Self-pay | Admitting: Dermatology

## 2012-11-25 DIAGNOSIS — C44621 Squamous cell carcinoma of skin of unspecified upper limb, including shoulder: Secondary | ICD-10-CM | POA: Diagnosis not present

## 2012-11-25 DIAGNOSIS — L570 Actinic keratosis: Secondary | ICD-10-CM | POA: Diagnosis not present

## 2012-11-25 DIAGNOSIS — D485 Neoplasm of uncertain behavior of skin: Secondary | ICD-10-CM | POA: Diagnosis not present

## 2012-12-01 ENCOUNTER — Other Ambulatory Visit: Payer: Self-pay | Admitting: Dermatology

## 2012-12-01 DIAGNOSIS — Z85828 Personal history of other malignant neoplasm of skin: Secondary | ICD-10-CM | POA: Diagnosis not present

## 2012-12-01 DIAGNOSIS — D485 Neoplasm of uncertain behavior of skin: Secondary | ICD-10-CM | POA: Diagnosis not present

## 2012-12-01 DIAGNOSIS — C44320 Squamous cell carcinoma of skin of unspecified parts of face: Secondary | ICD-10-CM | POA: Diagnosis not present

## 2013-01-14 ENCOUNTER — Encounter: Payer: Self-pay | Admitting: Family Medicine

## 2013-01-14 ENCOUNTER — Ambulatory Visit (INDEPENDENT_AMBULATORY_CARE_PROVIDER_SITE_OTHER): Payer: Medicare Other | Admitting: Family Medicine

## 2013-01-14 ENCOUNTER — Ambulatory Visit (INDEPENDENT_AMBULATORY_CARE_PROVIDER_SITE_OTHER): Payer: Medicare Other

## 2013-01-14 VITALS — BP 126/77 | HR 58 | Temp 97.0°F | Ht 66.0 in | Wt 186.0 lb

## 2013-01-14 DIAGNOSIS — C649 Malignant neoplasm of unspecified kidney, except renal pelvis: Secondary | ICD-10-CM | POA: Insufficient documentation

## 2013-01-14 DIAGNOSIS — C61 Malignant neoplasm of prostate: Secondary | ICD-10-CM | POA: Diagnosis not present

## 2013-01-14 DIAGNOSIS — G5690 Unspecified mononeuropathy of unspecified upper limb: Secondary | ICD-10-CM

## 2013-01-14 DIAGNOSIS — G5692 Unspecified mononeuropathy of left upper limb: Secondary | ICD-10-CM

## 2013-01-14 DIAGNOSIS — C641 Malignant neoplasm of right kidney, except renal pelvis: Secondary | ICD-10-CM

## 2013-01-14 DIAGNOSIS — E785 Hyperlipidemia, unspecified: Secondary | ICD-10-CM

## 2013-01-14 DIAGNOSIS — N184 Chronic kidney disease, stage 4 (severe): Secondary | ICD-10-CM | POA: Diagnosis not present

## 2013-01-14 DIAGNOSIS — Z933 Colostomy status: Secondary | ICD-10-CM | POA: Insufficient documentation

## 2013-01-14 DIAGNOSIS — R5383 Other fatigue: Secondary | ICD-10-CM | POA: Diagnosis not present

## 2013-01-14 DIAGNOSIS — M810 Age-related osteoporosis without current pathological fracture: Secondary | ICD-10-CM | POA: Insufficient documentation

## 2013-01-14 DIAGNOSIS — R5381 Other malaise: Secondary | ICD-10-CM | POA: Diagnosis not present

## 2013-01-14 DIAGNOSIS — I10 Essential (primary) hypertension: Secondary | ICD-10-CM | POA: Diagnosis not present

## 2013-01-14 DIAGNOSIS — E538 Deficiency of other specified B group vitamins: Secondary | ICD-10-CM | POA: Diagnosis not present

## 2013-01-14 LAB — POCT CBC
Granulocyte percent: 70.6 %G (ref 37–80)
HCT, POC: 39.7 % — AB (ref 43.5–53.7)
Hemoglobin: 12.7 g/dL — AB (ref 14.1–18.1)
POC Granulocyte: 5.6 (ref 2–6.9)
RDW, POC: 14.2 %

## 2013-01-14 LAB — HEPATIC FUNCTION PANEL
AST: 14 U/L (ref 0–37)
Bilirubin, Direct: 0.1 mg/dL (ref 0.0–0.3)
Total Bilirubin: 0.5 mg/dL (ref 0.3–1.2)

## 2013-01-14 LAB — BASIC METABOLIC PANEL WITH GFR
BUN: 32 mg/dL — ABNORMAL HIGH (ref 6–23)
Chloride: 105 mEq/L (ref 96–112)
Creat: 1.63 mg/dL — ABNORMAL HIGH (ref 0.50–1.35)
GFR, Est Non African American: 37 mL/min — ABNORMAL LOW

## 2013-01-14 LAB — LIPID PANEL
Cholesterol: 174 mg/dL (ref 0–200)
Triglycerides: 103 mg/dL (ref <150)
VLDL: 21 mg/dL (ref 0–40)

## 2013-01-14 LAB — THYROID PANEL WITH TSH: TSH: 2.379 u[IU]/mL (ref 0.350–4.500)

## 2013-01-14 LAB — VITAMIN B12: Vitamin B-12: 699 pg/mL (ref 211–911)

## 2013-01-14 NOTE — Progress Notes (Signed)
  Subjective:    Patient ID: Frederick Davis, male    DOB: 03-Oct-1923, 79 y.o.   MRN: [id number]  HPI This patient presents for recheck of multiple medical problems.   Patient Active Problem List  Diagnosis  . Essential hypertension, benign  . Colostomy in place  . Prostate cancer  . Renal cell carcinoma  . Osteoporosis, unspecified    In addition,neuropathy mof left arm  The allergies, current medications, past medical history, surgical history, family and social history are reviewed.  Immunizations reviewed.  Health maintenance reviewed.        Review of Systems  Constitutional: Positive for fatigue.  HENT: Negative.   Eyes: Negative.   Respiratory: Negative.   Cardiovascular: Negative.   Gastrointestinal: Negative.   Genitourinary: Negative.   Musculoskeletal: Positive for back pain (LBP) and arthralgias (L arm, R hip "comes and goes").  Neurological: Positive for numbness (L arm and hand while sleeping).  Psychiatric/Behavioral: Positive for sleep disturbance (due to L arm numbness and tingling).       Objective:   Physical Exam  BP 126/77  Pulse 58  Temp(Src) 97 F (36.1 C) (Oral)  Ht 5\' 6"  (1.676 m)  Wt 186 lb (84.369 kg)  BMI 30.04 kg/m2  The patient appeared well nourished and normally developed, alert and oriented to time and place. Speech, behavior and judgement appear normal. Vital signs as documented.  Head exam is unremarkable. No scleral icterus or pallor noted.  Neck is without jugular venous distension, thyromegally, or carotid bruits. Carotid upstrokes are brisk bilaterally. No cervical adenopathy. Lungs are clear anteriorly and posteriorly to auscultation. Normal respiratory effort. Cardiac exam reveals regular rate and rhythm. First and second heart sounds normal. No murmurs, rubs or gallops.  Abdominal exam reveals normal bowl sounds, no masses, no organomegaly and no aortic enlargement. No inguinal adenopathy. Colostomy in  place. Extremities are nonedematous and both femoral  pulses are normal. Skin without pallor or jaundice.  Warm and dry, without rash. Neurologic exam reveals normal slightly diminished  deep tendon reflexes and normal sensation.  [facility] reading (PRIMARY) by  Dr. [name]   CXR reveals enlarged heart and atherosclerotic descending  aorta                                     Assessment & Plan:  1. Essential hypertension, benign   2. Colostomy in place   3. Prostate cancer  - DG Chest 2 View; Future  4. Renal cell carcinoma, right  - DG Chest 2 View; Future  5. Osteoporosis, unspecified   6. Other malaise and fatigue  - Thyroid Panel With TSH - Vitamin B12 - Vitamin D 25 hydroxy  7. Chronic kidney disease (CKD), stage IV (severe)  - POCT CBC - BASIC METABOLIC PANEL WITH GFR  8. Other and unspecified hyperlipidemia  - Lipid panel - Hepatic function panel  9. Neuropathy, arm, left

## 2013-01-14 NOTE — Patient Instructions (Addendum)
Fall precautions discussed. Continue current meds and therapeutic lifestyle changes Moist heat to the neck as directed for neuropathy When weather warms get out and tried to walk Will evaluate labs and hopefully find a reason for fatigue and weakness

## 2013-01-15 LAB — VITAMIN D 25 HYDROXY (VIT D DEFICIENCY, FRACTURES): Vit D, 25-Hydroxy: 49 ng/mL (ref 30–89)

## 2013-01-22 ENCOUNTER — Other Ambulatory Visit (INDEPENDENT_AMBULATORY_CARE_PROVIDER_SITE_OTHER): Payer: Medicare Other

## 2013-01-22 DIAGNOSIS — Z1212 Encounter for screening for malignant neoplasm of rectum: Secondary | ICD-10-CM

## 2013-01-22 NOTE — Progress Notes (Signed)
Patient dropped off fobt 

## 2013-01-23 LAB — FECAL OCCULT BLOOD, IMMUNOCHEMICAL: Fecal Occult Blood: NEGATIVE

## 2013-01-25 DIAGNOSIS — M531 Cervicobrachial syndrome: Secondary | ICD-10-CM | POA: Diagnosis not present

## 2013-01-25 DIAGNOSIS — M4712 Other spondylosis with myelopathy, cervical region: Secondary | ICD-10-CM | POA: Diagnosis not present

## 2013-01-25 DIAGNOSIS — M9981 Other biomechanical lesions of cervical region: Secondary | ICD-10-CM | POA: Diagnosis not present

## 2013-01-28 DIAGNOSIS — M4712 Other spondylosis with myelopathy, cervical region: Secondary | ICD-10-CM | POA: Diagnosis not present

## 2013-01-28 DIAGNOSIS — M531 Cervicobrachial syndrome: Secondary | ICD-10-CM | POA: Diagnosis not present

## 2013-01-28 DIAGNOSIS — M9981 Other biomechanical lesions of cervical region: Secondary | ICD-10-CM | POA: Diagnosis not present

## 2013-02-15 DIAGNOSIS — M4712 Other spondylosis with myelopathy, cervical region: Secondary | ICD-10-CM | POA: Diagnosis not present

## 2013-02-15 DIAGNOSIS — M531 Cervicobrachial syndrome: Secondary | ICD-10-CM | POA: Diagnosis not present

## 2013-02-15 DIAGNOSIS — M9981 Other biomechanical lesions of cervical region: Secondary | ICD-10-CM | POA: Diagnosis not present

## 2013-02-17 DIAGNOSIS — Z8546 Personal history of malignant neoplasm of prostate: Secondary | ICD-10-CM | POA: Diagnosis not present

## 2013-02-17 DIAGNOSIS — Z905 Acquired absence of kidney: Secondary | ICD-10-CM | POA: Diagnosis not present

## 2013-02-17 DIAGNOSIS — Z85528 Personal history of other malignant neoplasm of kidney: Secondary | ICD-10-CM | POA: Diagnosis not present

## 2013-02-17 DIAGNOSIS — C61 Malignant neoplasm of prostate: Secondary | ICD-10-CM | POA: Diagnosis not present

## 2013-02-17 DIAGNOSIS — N289 Disorder of kidney and ureter, unspecified: Secondary | ICD-10-CM | POA: Diagnosis not present

## 2013-02-22 DIAGNOSIS — M4712 Other spondylosis with myelopathy, cervical region: Secondary | ICD-10-CM | POA: Diagnosis not present

## 2013-02-22 DIAGNOSIS — M531 Cervicobrachial syndrome: Secondary | ICD-10-CM | POA: Diagnosis not present

## 2013-02-22 DIAGNOSIS — M9981 Other biomechanical lesions of cervical region: Secondary | ICD-10-CM | POA: Diagnosis not present

## 2013-02-23 DIAGNOSIS — D239 Other benign neoplasm of skin, unspecified: Secondary | ICD-10-CM | POA: Diagnosis not present

## 2013-02-23 DIAGNOSIS — Z85828 Personal history of other malignant neoplasm of skin: Secondary | ICD-10-CM | POA: Diagnosis not present

## 2013-02-23 DIAGNOSIS — L570 Actinic keratosis: Secondary | ICD-10-CM | POA: Diagnosis not present

## 2013-02-23 DIAGNOSIS — L821 Other seborrheic keratosis: Secondary | ICD-10-CM | POA: Diagnosis not present

## 2013-03-01 DIAGNOSIS — M531 Cervicobrachial syndrome: Secondary | ICD-10-CM | POA: Diagnosis not present

## 2013-03-01 DIAGNOSIS — M9981 Other biomechanical lesions of cervical region: Secondary | ICD-10-CM | POA: Diagnosis not present

## 2013-03-01 DIAGNOSIS — M4712 Other spondylosis with myelopathy, cervical region: Secondary | ICD-10-CM | POA: Diagnosis not present

## 2013-03-12 ENCOUNTER — Inpatient Hospital Stay (HOSPITAL_COMMUNITY): Payer: Medicare Other

## 2013-03-12 ENCOUNTER — Encounter (HOSPITAL_COMMUNITY): Payer: Self-pay | Admitting: Emergency Medicine

## 2013-03-12 ENCOUNTER — Ambulatory Visit (HOSPITAL_COMMUNITY): Admit: 2013-03-12 | Payer: Self-pay | Admitting: Cardiovascular Disease

## 2013-03-12 ENCOUNTER — Inpatient Hospital Stay (HOSPITAL_COMMUNITY)
Admission: EM | Admit: 2013-03-12 | Discharge: 2013-03-18 | DRG: 250 | Disposition: A | Discharge status: Home / Self Care | Payer: Medicare Other | Attending: Cardiovascular Disease | Admitting: Cardiovascular Disease

## 2013-03-12 ENCOUNTER — Encounter (HOSPITAL_COMMUNITY)
Admission: EM | Disposition: A | Discharge status: Home / Self Care | Payer: Self-pay | Source: Home / Self Care | Attending: Cardiovascular Disease

## 2013-03-12 ENCOUNTER — Encounter (HOSPITAL_COMMUNITY): Payer: Self-pay | Admitting: Anesthesiology

## 2013-03-12 ENCOUNTER — Emergency Department (HOSPITAL_COMMUNITY): Payer: Medicare Other | Admitting: Anesthesiology

## 2013-03-12 DIAGNOSIS — Z87442 Personal history of urinary calculi: Secondary | ICD-10-CM | POA: Insufficient documentation

## 2013-03-12 DIAGNOSIS — I2119 ST elevation (STEMI) myocardial infarction involving other coronary artery of inferior wall: Secondary | ICD-10-CM | POA: Diagnosis not present

## 2013-03-12 DIAGNOSIS — R570 Cardiogenic shock: Secondary | ICD-10-CM | POA: Diagnosis not present

## 2013-03-12 DIAGNOSIS — Z823 Family history of stroke: Secondary | ICD-10-CM

## 2013-03-12 DIAGNOSIS — G5690 Unspecified mononeuropathy of unspecified upper limb: Secondary | ICD-10-CM | POA: Diagnosis present

## 2013-03-12 DIAGNOSIS — J9819 Other pulmonary collapse: Secondary | ICD-10-CM | POA: Diagnosis not present

## 2013-03-12 DIAGNOSIS — N183 Chronic kidney disease, stage 3 unspecified: Secondary | ICD-10-CM

## 2013-03-12 DIAGNOSIS — Z7982 Long term (current) use of aspirin: Secondary | ICD-10-CM

## 2013-03-12 DIAGNOSIS — Z9079 Acquired absence of other genital organ(s): Secondary | ICD-10-CM | POA: Insufficient documentation

## 2013-03-12 DIAGNOSIS — E039 Hypothyroidism, unspecified: Secondary | ICD-10-CM | POA: Diagnosis present

## 2013-03-12 DIAGNOSIS — E785 Hyperlipidemia, unspecified: Secondary | ICD-10-CM | POA: Diagnosis present

## 2013-03-12 DIAGNOSIS — Z85828 Personal history of other malignant neoplasm of skin: Secondary | ICD-10-CM | POA: Diagnosis not present

## 2013-03-12 DIAGNOSIS — Z87891 Personal history of nicotine dependence: Secondary | ICD-10-CM

## 2013-03-12 DIAGNOSIS — N2889 Other specified disorders of kidney and ureter: Secondary | ICD-10-CM | POA: Insufficient documentation

## 2013-03-12 DIAGNOSIS — R4182 Altered mental status, unspecified: Secondary | ICD-10-CM | POA: Diagnosis not present

## 2013-03-12 DIAGNOSIS — I498 Other specified cardiac arrhythmias: Secondary | ICD-10-CM | POA: Diagnosis present

## 2013-03-12 DIAGNOSIS — Z9861 Coronary angioplasty status: Secondary | ICD-10-CM

## 2013-03-12 DIAGNOSIS — Z933 Colostomy status: Secondary | ICD-10-CM | POA: Diagnosis not present

## 2013-03-12 DIAGNOSIS — K449 Diaphragmatic hernia without obstruction or gangrene: Secondary | ICD-10-CM | POA: Diagnosis present

## 2013-03-12 DIAGNOSIS — D649 Anemia, unspecified: Secondary | ICD-10-CM | POA: Diagnosis present

## 2013-03-12 DIAGNOSIS — I469 Cardiac arrest, cause unspecified: Secondary | ICD-10-CM | POA: Diagnosis not present

## 2013-03-12 DIAGNOSIS — M810 Age-related osteoporosis without current pathological fracture: Secondary | ICD-10-CM | POA: Diagnosis present

## 2013-03-12 DIAGNOSIS — N390 Urinary tract infection, site not specified: Secondary | ICD-10-CM | POA: Diagnosis present

## 2013-03-12 DIAGNOSIS — I129 Hypertensive chronic kidney disease with stage 1 through stage 4 chronic kidney disease, or unspecified chronic kidney disease: Secondary | ICD-10-CM | POA: Diagnosis present

## 2013-03-12 DIAGNOSIS — R918 Other nonspecific abnormal finding of lung field: Secondary | ICD-10-CM | POA: Diagnosis not present

## 2013-03-12 DIAGNOSIS — I959 Hypotension, unspecified: Secondary | ICD-10-CM | POA: Diagnosis present

## 2013-03-12 DIAGNOSIS — Z85048 Personal history of other malignant neoplasm of rectum, rectosigmoid junction, and anus: Secondary | ICD-10-CM | POA: Diagnosis not present

## 2013-03-12 DIAGNOSIS — Z7902 Long term (current) use of antithrombotics/antiplatelets: Secondary | ICD-10-CM

## 2013-03-12 DIAGNOSIS — J9600 Acute respiratory failure, unspecified whether with hypoxia or hypercapnia: Secondary | ICD-10-CM | POA: Diagnosis present

## 2013-03-12 DIAGNOSIS — Z79899 Other long term (current) drug therapy: Secondary | ICD-10-CM | POA: Diagnosis not present

## 2013-03-12 DIAGNOSIS — R1310 Dysphagia, unspecified: Secondary | ICD-10-CM | POA: Diagnosis not present

## 2013-03-12 DIAGNOSIS — I10 Essential (primary) hypertension: Secondary | ICD-10-CM

## 2013-03-12 DIAGNOSIS — Z8546 Personal history of malignant neoplasm of prostate: Secondary | ICD-10-CM | POA: Diagnosis not present

## 2013-03-12 DIAGNOSIS — G9349 Other encephalopathy: Secondary | ICD-10-CM | POA: Diagnosis not present

## 2013-03-12 DIAGNOSIS — I517 Cardiomegaly: Secondary | ICD-10-CM | POA: Diagnosis not present

## 2013-03-12 DIAGNOSIS — I2510 Atherosclerotic heart disease of native coronary artery without angina pectoris: Secondary | ICD-10-CM

## 2013-03-12 DIAGNOSIS — I219 Acute myocardial infarction, unspecified: Secondary | ICD-10-CM

## 2013-03-12 DIAGNOSIS — Z9889 Other specified postprocedural states: Secondary | ICD-10-CM | POA: Insufficient documentation

## 2013-03-12 DIAGNOSIS — C4492 Squamous cell carcinoma of skin, unspecified: Secondary | ICD-10-CM | POA: Insufficient documentation

## 2013-03-12 DIAGNOSIS — R079 Chest pain, unspecified: Secondary | ICD-10-CM | POA: Diagnosis not present

## 2013-03-12 DIAGNOSIS — N529 Male erectile dysfunction, unspecified: Secondary | ICD-10-CM | POA: Insufficient documentation

## 2013-03-12 DIAGNOSIS — Z85528 Personal history of other malignant neoplasm of kidney: Secondary | ICD-10-CM | POA: Diagnosis not present

## 2013-03-12 DIAGNOSIS — M1990 Unspecified osteoarthritis, unspecified site: Secondary | ICD-10-CM | POA: Insufficient documentation

## 2013-03-12 DIAGNOSIS — Z4682 Encounter for fitting and adjustment of non-vascular catheter: Secondary | ICD-10-CM | POA: Diagnosis not present

## 2013-03-12 DIAGNOSIS — I252 Old myocardial infarction: Secondary | ICD-10-CM | POA: Diagnosis present

## 2013-03-12 DIAGNOSIS — I442 Atrioventricular block, complete: Secondary | ICD-10-CM | POA: Diagnosis present

## 2013-03-12 HISTORY — DX: Anemia, unspecified: D64.9

## 2013-03-12 HISTORY — DX: Acute myocardial infarction, unspecified: I21.9

## 2013-03-12 HISTORY — PX: LEFT HEART CATHETERIZATION WITH CORONARY ANGIOGRAM: SHX5451

## 2013-03-12 LAB — LACTIC ACID, PLASMA: Lactic Acid, Venous: 2.4 mmol/L — ABNORMAL HIGH (ref 0.5–2.2)

## 2013-03-12 LAB — COMPREHENSIVE METABOLIC PANEL
ALT: 190 U/L — ABNORMAL HIGH (ref 0–53)
AST: 204 U/L — ABNORMAL HIGH (ref 0–37)
Albumin: 3.8 g/dL (ref 3.5–5.2)
Alkaline Phosphatase: 110 U/L (ref 39–117)
BUN: 35 mg/dL — ABNORMAL HIGH (ref 6–23)
CO2: 18 mEq/L — ABNORMAL LOW (ref 19–32)
Calcium: 9.3 mg/dL (ref 8.4–10.5)
Chloride: 104 mEq/L (ref 96–112)
Creatinine, Ser: 1.58 mg/dL — ABNORMAL HIGH (ref 0.50–1.35)
GFR calc Af Amer: 43 mL/min — ABNORMAL LOW (ref >90)
GFR calc non Af Amer: 37 mL/min — ABNORMAL LOW (ref >90)
Glucose, Bld: 253 mg/dL — ABNORMAL HIGH (ref 70–99)
Potassium: 4.4 mEq/L (ref 3.5–5.1)
Sodium: 138 mEq/L (ref 135–145)
Total Bilirubin: 0.4 mg/dL (ref 0.3–1.2)
Total Protein: 6.5 g/dL (ref 6.0–8.3)

## 2013-03-12 LAB — CBC WITH DIFFERENTIAL/PLATELET
Basophils Absolute: 0 10*3/uL (ref 0.0–0.1)
Basophils Relative: 0 % (ref 0–1)
Eosinophils Absolute: 0 10*3/uL (ref 0.0–0.7)
Eosinophils Relative: 0 % (ref 0–5)
HCT: 39.7 % (ref 39.0–52.0)
Hemoglobin: 12.8 g/dL — ABNORMAL LOW (ref 13.0–17.0)
Lymphocytes Relative: 6 % — ABNORMAL LOW (ref 12–46)
Lymphs Abs: 1.1 10*3/uL (ref 0.7–4.0)
MCH: 27.3 pg (ref 26.0–34.0)
MCHC: 32.2 g/dL (ref 30.0–36.0)
MCV: 84.6 fL (ref 78.0–100.0)
Monocytes Absolute: 1.2 10*3/uL — ABNORMAL HIGH (ref 0.1–1.0)
Monocytes Relative: 7 % (ref 3–12)
Neutro Abs: 14.9 10*3/uL — ABNORMAL HIGH (ref 1.7–7.7)
Neutrophils Relative %: 87 % — ABNORMAL HIGH (ref 43–77)
Platelets: 272 10*3/uL (ref 150–400)
RBC: 4.69 MIL/uL (ref 4.22–5.81)
RDW: 14.2 % (ref 11.5–15.5)
WBC: 17.3 10*3/uL — ABNORMAL HIGH (ref 4.0–10.5)

## 2013-03-12 LAB — APTT: aPTT: 77 seconds — ABNORMAL HIGH (ref 24–37)

## 2013-03-12 LAB — POCT I-STAT 3, VENOUS BLOOD GAS (G3P V)
Bicarbonate: 20.1 mEq/L (ref 20.0–24.0)
pCO2, Ven: 41.3 mmHg — ABNORMAL LOW (ref 45.0–50.0)
pH, Ven: 7.297 (ref 7.250–7.300)

## 2013-03-12 LAB — URINALYSIS, ROUTINE W REFLEX MICROSCOPIC
Glucose, UA: NEGATIVE mg/dL
Hgb urine dipstick: NEGATIVE
Leukocytes, UA: NEGATIVE
Protein, ur: >300 mg/dL — AB
Specific Gravity, Urine: 1.02 (ref 1.005–1.030)
pH: 5 (ref 5.0–8.0)

## 2013-03-12 LAB — POCT I-STAT, CHEM 8
BUN: 31 mg/dL — ABNORMAL HIGH (ref 6–23)
Calcium, Ion: 1.24 mmol/L (ref 1.13–1.30)
Chloride: 113 mEq/L — ABNORMAL HIGH (ref 96–112)
HCT: 30 % — ABNORMAL LOW (ref 39.0–52.0)
Potassium: 4.2 mEq/L (ref 3.5–5.1)
Sodium: 143 mEq/L (ref 135–145)

## 2013-03-12 LAB — URINE MICROSCOPIC-ADD ON

## 2013-03-12 LAB — GLUCOSE, CAPILLARY

## 2013-03-12 LAB — TROPONIN I: Troponin I: 9.25 ng/mL (ref <0.30)

## 2013-03-12 LAB — CORTISOL: Cortisol, Plasma: 35.4 ug/dL

## 2013-03-12 LAB — MAGNESIUM: Magnesium: 2.1 mg/dL (ref 1.5–2.5)

## 2013-03-12 LAB — MRSA PCR SCREENING: MRSA by PCR: NEGATIVE

## 2013-03-12 SURGERY — LEFT HEART CATHETERIZATION WITH CORONARY ANGIOGRAM
Anesthesia: LOCAL

## 2013-03-12 MED ORDER — ASPIRIN 81 MG PO CHEW
324.0000 mg | CHEWABLE_TABLET | ORAL | Status: AC
  Administered 2013-03-12: 324 mg via ORAL

## 2013-03-12 MED ORDER — SODIUM CHLORIDE 0.9 % IV SOLN
INTRAVENOUS | Status: AC
  Administered 2013-03-12: 1000 mL via INTRAVENOUS

## 2013-03-12 MED ORDER — DOPAMINE-DEXTROSE 3.2-5 MG/ML-% IV SOLN
2.0000 ug/kg/min | INTRAVENOUS | Status: DC
  Administered 2013-03-12: 20 ug/kg/min via INTRAVENOUS
  Administered 2013-03-13: 3 ug/kg/min via INTRAVENOUS
  Filled 2013-03-12: qty 250

## 2013-03-12 MED ORDER — NITROGLYCERIN 1 MG/10 ML FOR IR/CATH LAB
INTRA_ARTERIAL | Status: AC
  Filled 2013-03-12: qty 10

## 2013-03-12 MED ORDER — SODIUM CHLORIDE 0.9 % IV SOLN
25.0000 ug/h | INTRAVENOUS | Status: DC
  Administered 2013-03-12: 75 ug/h via INTRAVENOUS
  Administered 2013-03-14: 50 ug/h via INTRAVENOUS
  Filled 2013-03-12 (×2): qty 50

## 2013-03-12 MED ORDER — BIVALIRUDIN 250 MG IV SOLR
INTRAVENOUS | Status: AC
  Filled 2013-03-12: qty 250

## 2013-03-12 MED ORDER — NOREPINEPHRINE BITARTRATE 1 MG/ML IJ SOLN
INTRAMUSCULAR | Status: AC
  Filled 2013-03-12: qty 8

## 2013-03-12 MED ORDER — ACETAMINOPHEN 325 MG PO TABS: 650.0000 mg | ORAL_TABLET | ORAL | Status: DC | PRN

## 2013-03-12 MED ORDER — NITROGLYCERIN 0.4 MG SL SUBL: 0.4000 mg | SUBLINGUAL_TABLET | SUBLINGUAL | Status: DC | PRN

## 2013-03-12 MED ORDER — TICAGRELOR 90 MG PO TABS
180.0000 mg | ORAL_TABLET | Freq: Once | ORAL | Status: AC
  Administered 2013-03-12: 180 mg via NASOGASTRIC
  Filled 2013-03-12: qty 2

## 2013-03-12 MED ORDER — LIDOCAINE HCL (PF) 1 % IJ SOLN
INTRAMUSCULAR | Status: AC
  Filled 2013-03-12: qty 30

## 2013-03-12 MED ORDER — MIDAZOLAM BOLUS VIA INFUSION
1.0000 mg | INTRAVENOUS | Status: DC | PRN
Start: 2013-03-12 — End: 2013-03-13
  Administered 2013-03-12: 2 mg via INTRAVENOUS
  Filled 2013-03-12: qty 2

## 2013-03-12 MED ORDER — ATORVASTATIN CALCIUM 80 MG PO TABS
80.0000 mg | ORAL_TABLET | Freq: Every day | ORAL | Status: DC
  Administered 2013-03-13 – 2013-03-17 (×2): 80 mg via NASOGASTRIC
  Filled 2013-03-12 (×7): qty 1

## 2013-03-12 MED ORDER — ASPIRIN 300 MG RE SUPP
300.0000 mg | RECTAL | Status: AC
  Filled 2013-03-12: qty 1

## 2013-03-12 MED ORDER — ONDANSETRON HCL 4 MG/2ML IJ SOLN: 4.0000 mg | Freq: Four times a day (QID) | INTRAMUSCULAR | Status: DC | PRN

## 2013-03-12 MED ORDER — NOREPINEPHRINE BITARTRATE 1 MG/ML IJ SOLN
2.0000 ug/min | INTRAVENOUS | Status: DC
  Filled 2013-03-12: qty 4

## 2013-03-12 MED ORDER — SODIUM CHLORIDE 0.9 % IV SOLN
0.2500 mg/kg/h | INTRAVENOUS | Status: DC
  Administered 2013-03-12: 0.25 mg/kg/h via INTRAVENOUS
  Filled 2013-03-12: qty 250

## 2013-03-12 MED ORDER — ONDANSETRON HCL 4 MG/2ML IJ SOLN
4.0000 mg | Freq: Four times a day (QID) | INTRAMUSCULAR | Status: DC | PRN
  Administered 2013-03-14: 4 mg via INTRAVENOUS
  Filled 2013-03-12: qty 2

## 2013-03-12 MED ORDER — ETOMIDATE 2 MG/ML IV SOLN
INTRAVENOUS | Status: AC | PRN
  Administered 2013-03-12: 10 mg via INTRAVENOUS

## 2013-03-12 MED ORDER — HEPARIN (PORCINE) IN NACL 2-0.9 UNIT/ML-% IJ SOLN
INTRAMUSCULAR | Status: AC
  Filled 2013-03-12: qty 1000

## 2013-03-12 MED ORDER — ASPIRIN EC 81 MG PO TBEC: 81.0000 mg | DELAYED_RELEASE_TABLET | Freq: Every day | ORAL | Status: DC

## 2013-03-12 MED ORDER — LEVOTHYROXINE SODIUM 75 MCG PO TABS
75.0000 ug | ORAL_TABLET | Freq: Every day | ORAL | Status: DC
  Administered 2013-03-13 – 2013-03-18 (×6): 75 ug via ORAL
  Filled 2013-03-12 (×7): qty 1

## 2013-03-12 MED ORDER — ASPIRIN 81 MG PO CHEW
81.0000 mg | CHEWABLE_TABLET | Freq: Every day | ORAL | Status: DC
  Administered 2013-03-13 – 2013-03-18 (×6): 81 mg via NASOGASTRIC
  Filled 2013-03-12: qty 4
  Filled 2013-03-12 (×7): qty 1

## 2013-03-12 MED ORDER — DOPAMINE-DEXTROSE 3.2-5 MG/ML-% IV SOLN
INTRAVENOUS | Status: AC
  Filled 2013-03-12: qty 250

## 2013-03-12 MED ORDER — SODIUM CHLORIDE 0.9 % IV SOLN
0.2500 mg/kg/h | INTRAVENOUS | Status: AC
  Administered 2013-03-12: 0.25 mg/kg/h via INTRAVENOUS
  Filled 2013-03-12: qty 250

## 2013-03-12 MED ORDER — SODIUM CHLORIDE 0.9 % IV SOLN
1.0000 mg/h | INTRAVENOUS | Status: DC
  Administered 2013-03-12: 4 mg/h via INTRAVENOUS
  Administered 2013-03-12: 6 mg/h via INTRAVENOUS
  Filled 2013-03-12 (×2): qty 10

## 2013-03-12 MED ORDER — LIDOCAINE HCL (CARDIAC) 20 MG/ML IV SOLN
INTRAVENOUS | Status: AC | PRN
  Administered 2013-03-12: 80 mg via INTRAVENOUS
  Administered 2015-12-08: 50 mg via INTRAVENOUS

## 2013-03-12 MED ORDER — TICAGRELOR 90 MG PO TABS
90.0000 mg | ORAL_TABLET | Freq: Two times a day (BID) | ORAL | Status: DC
  Administered 2013-03-13 – 2013-03-17 (×9): 90 mg via ORAL
  Filled 2013-03-12 (×10): qty 1

## 2013-03-12 MED ORDER — NOREPINEPHRINE BITARTRATE 1 MG/ML IJ SOLN
2.0000 ug/min | INTRAVENOUS | Status: DC
  Administered 2013-03-12 (×2): 30 ug/min via INTRAVENOUS
  Administered 2013-03-13: 4 ug/min via INTRAVENOUS
  Administered 2013-03-13: 19 ug/min via INTRAVENOUS
  Administered 2013-03-13: 11 ug/min via INTRAVENOUS
  Filled 2013-03-12 (×4): qty 8

## 2013-03-12 MED ORDER — FENTANYL BOLUS VIA INFUSION
25.0000 ug | Freq: Four times a day (QID) | INTRAVENOUS | Status: DC | PRN
  Administered 2013-03-12: 50 ug via INTRAVENOUS
  Filled 2013-03-12: qty 100

## 2013-03-12 MED ORDER — SUCCINYLCHOLINE CHLORIDE 20 MG/ML IJ SOLN
INTRAMUSCULAR | Status: AC | PRN
  Administered 2013-03-12: 100 mg via INTRAVENOUS

## 2013-03-12 MED ORDER — PANTOPRAZOLE SODIUM 40 MG IV SOLR
40.0000 mg | INTRAVENOUS | Status: DC
  Administered 2013-03-13: 40 mg via INTRAVENOUS
  Filled 2013-03-12 (×2): qty 40

## 2013-03-12 MED ORDER — EPTIFIBATIDE 75 MG/100ML IV SOLN
1.0000 ug/kg/min | INTRAVENOUS | Status: AC
  Administered 2013-03-12 (×2): 1 ug/kg/min via INTRAVENOUS
  Filled 2013-03-12: qty 100

## 2013-03-12 NOTE — Consult Note (Signed)
PULMONARY  / CRITICAL CARE MEDICINE  Name: Frederick Davis MRN: [id number] DOB: December 19, 1922    ADMISSION DATE:  03/12/2013 CONSULTATION DATE:  03/12/13  REFERRING MD :  Dr. Clifton James PRIMARY SERVICE: Cardiology   CHIEF COMPLAINT:  Cardiac Arrest  BRIEF PATIENT DESCRIPTION: 79 y/o M with PMH of hypothyroidism, HTN, Remote Rectal / Prostate CA, Renal Cell CA, esophageal strictures, CKD 3 admitted 5/23 with complaints of chest pain.  In route became pale, diaphoretic and bradycardic with rate in 40's.  Emergently taken to cath lab with findings of acute inferior STEMI.    SIGNIFICANT EVENTS / STUDIES:  5/23 - Admit with acute inferior STEMI / cardiogenic shock / acute resp fx  LINES / TUBES: OETT 5/23>>> L Fem TLC 5/23>>> R Fem Venous Sheath>>>  CULTURES:   ANTIBIOTICS:   HISTORY OF PRESENT ILLNESS:  79 y/o M with PMH of hypothyroidism, HTN, Remote Rectal / Prostate CA, Renal Cell CA, esophageal strictures, CKD 3 admitted 5/23 with complaints of chest pain.  In route became pale, diaphoretic and bradycardic with rate in 40's.  Emergently taken to cath lab with findings of acute inferior STEMI.  Patient was hemodynamically unstable in cath lab secondary to cardiogenic shock / bradycardia in setting of inferior MI and required levophed / dopamine.  Patient intubated in cath lab per anesthesia.   PCCM consulted for post STEMI respiratory failure.     PAST MEDICAL HISTORY :  Past Medical History  Diagnosis Date  . Hypothyroidism   . Hypertension   . Rectum cancer 1986  . PC (prostate cancer) 1987  . Renal cell cancer   . Osteoporosis   . Esophageal stricture    Past Surgical History  Procedure Laterality Date  . Kidney surgery  2004  . Radiofrequency ablation kidney  2006  . Hernia repair      2007  . Renal sphincter appliance  2007  . Colonoscopy  01/08/2012    Procedure: COLONOSCOPY;  Surgeon: Malissa Hippo, MD;  Location: [facility];  Service: Endoscopy;  Laterality:  N/A;  1030  . Colostomy Left 1986    colon cancer   Prior to Admission medications   Medication Sig Start Date End Date Taking? Authorizing Provider  amLODipine (NORVASC) 10 MG tablet Take 10 mg by mouth daily.    Historical Provider, MD  Bioflavonoid Products (BIOFLEX) TABS Take 1 tablet by mouth daily.    Historical Provider, MD  Calcium Citrate-Vitamin D (CITRACAL + D PO) Take by mouth daily.    Historical Provider, MD  cholecalciferol (VITAMIN D) 1000 UNITS tablet Take 2,000 Units by mouth daily.    Historical Provider, MD  glucosamine-chondroitin 500-400 MG tablet Take 1 tablet by mouth daily.     Historical Provider, MD  levothyroxine (SYNTHROID, LEVOTHROID) 75 MCG tablet Take 75 mcg by mouth daily.    Historical Provider, MD  Multiple Vitamin (MULITIVITAMIN WITH MINERALS) TABS Take 1 tablet by mouth daily.    Historical Provider, MD   No Known Allergies  FAMILY HISTORY:  Family History  Problem Relation Age of Onset  . Stroke Mother 69  . AAA (abdominal aortic aneurysm) Father 58    died from rupture at 69 yo   SOCIAL HISTORY:  reports that he quit smoking about 44 years ago. His smoking use included Cigarettes. He started smoking about 66 years ago. He smoked 0.50 packs per day. He does not have any smokeless tobacco history on file. He reports that he drinks about 0.5 ounces  of alcohol per week. He reports that he does not use illicit drugs.  REVIEW OF SYSTEMS:  Unable to complete.   SUBJECTIVE:   VITAL SIGNS: Pulse Rate:  [24] 24 (05/23 1446) Weight:  [186 lb 1.1 oz (84.4 kg)] 186 lb 1.1 oz (84.4 kg) (05/23 1540)  HEMODYNAMICS:    VENTILATOR SETTINGS:   INTAKE / OUTPUT: Intake/Output   None     PHYSICAL EXAMINATION: General:  Elderly male on vent Neuro:  Sedate, no distress HEENT:  OETT, mm pink/moist Cardiovascular:  s1s2 rrr, 2/6 SEM Lungs:  resp's even/non-labored on vent, lungs bilaterally clear Abdomen:  Round/soft, bsx4 hypoactive, LLQ  colostomy Musculoskeletal:  No acute deformities Skin:  Warm/dry, no edema  LABS: No results found for this basename: HGB, WBC, PLT, NA, K, CL, CO2, GLUCOSE, BUN, CREATININE, CALCIUM, MG, PHOS, AST, ALT, ALKPHOS, BILITOT, PROT, ALBUMIN, APTT, INR, LATICACIDVEN, TROPONINI, PROCALCITON, PROBNP, O2SATVEN, PHART, PCO2ART, PO2ART,  in the last 168 hours No results found for this basename: GLUCAP,  in the last 168 hours  CXR: 5/23 >>>  ASSESSMENT / PLAN:  PULMONARY A: Acute Respiratory Failure - in setting of cardiogenic shock  P:   -full vent support -consider SBT in am 5/24 -f/u abg / CXR now  CARDIOVASCULAR A:  Cardiogenic Shock -  In setting of inferior MI STEMI - occluded proximal RCA s/p thombectomy, PTCA with balloon angioplasty of RCA CAD - severe disease noted on LHC to mid LAD  P:  -recommendations per Cardiology -pressors to maintain MAP >65 & HR >50 -assess lactate, cortisol  RENAL A:   At Risk ATN - in setting of hypotension / cardiogenic shock CKD Stage 3  P:   -f/u labs now  GASTROINTESTINAL A:   SUP  P:   -PPI -NPO -Consider early feeds if not extubated in next 24 hours  HEMATOLOGIC A:   Anemia   P:  -follow H/H -no evidence of acute bleeding, monitor closely while on integrilin etc  INFECTIOUS A:   No evidence of acute infectious process.   P:   -monitor fever curve / leukocytosis  ENDOCRINE A:   Hypothyroidism  P:   -check TSH, synthroid 75 mcg daily (home dose) -monitor glucose, not elevated on admit but may require SSI with stress response  NEUROLOGIC A:   Acute Encephalopathy - in setting of NSTEMI, resp fx  P:   -monitor neuro status -versed / fentanyl for sedation   Canary Brim, NP-C [facility] Pgr: [phone] or [phone]  I have personally obtained a history, examined the patient, evaluated laboratory and imaging results, formulated the assessment and plan and placed orders.  CRITICAL  CARE: The patient is critically ill with multiple organ systems failure and requires high complexity decision making for assessment and support, frequent evaluation and titration of therapies, application of advanced monitoring technologies and extensive interpretation of multiple databases. Critical Care Time devoted to patient care services described in this note is 45 minutes.   Alyson Reedy, [name] Pulmonary and Critical Care Medicine [facility] Pager: [phone]  03/12/2013, 4:51 PM

## 2013-03-12 NOTE — Procedures (Signed)
Arterial Catheter Insertion Procedure Note Frederick Davis [id number] Sep 22, 1923  Procedure: Insertion of Arterial Catheter  Indications: Blood pressure monitoring and Frequent blood sampling  Procedure Details Consent: Unable to obtain consent because of emergent medical necessity. Time Out: Verified patient identification, verified procedure, site/side was marked, verified correct patient position, special equipment/implants available, medications/allergies/relevent history reviewed, required imaging and test results available.  Performed  Maximum sterile technique was used including antiseptics, cap, gloves, gown, hand hygiene, mask and sheet. Skin prep: Chlorhexidine; local anesthetic administered 20 gauge catheter was inserted into right radial artery using the Seldinger technique.  Evaluation Blood flow good; BP tracing good. Complications: No apparent complications.   Frederick Davis 03/12/2013

## 2013-03-12 NOTE — Anesthesia Preprocedure Evaluation (Signed)
Anesthesia Evaluation  Patient identified by MRN, date of birth, ID band Patient awake    Reviewed: Allergy & Precautions, H&P , NPO status , Patient's Chart, lab work & pertinent test results, reviewed documented beta blocker date and time   Airway Mallampati: III TM Distance: >3 FB Neck ROM: Full    Dental  (+) Caps and Teeth Intact   Pulmonary          Cardiovascular hypertension, + Past MI     Neuro/Psych    GI/Hepatic   Endo/Other  Hypothyroidism   Renal/GU      Musculoskeletal   Abdominal   Peds  Hematology   Anesthesia Other Findings   Reproductive/Obstetrics                           Anesthesia Physical Anesthesia Plan  ASA: III and emergent  Anesthesia Plan:    Post-op Pain Management:    Induction: Intravenous and Rapid sequence  Airway Management Planned: Oral ETT  Additional Equipment:   Intra-op Plan:   Post-operative Plan: Post-operative intubation/ventilation  Informed Consent: I have reviewed the patients History and Physical, chart, labs and discussed the procedure including the risks, benefits and alternatives for the proposed anesthesia with the patient or authorized representative who has indicated his/her understanding and acceptance.   Only emergency history available  Plan Discussed with: CRNA, Anesthesiologist and Surgeon  Anesthesia Plan Comments:         Anesthesia Quick Evaluation

## 2013-03-12 NOTE — CV Procedure (Signed)
   Cardiac Catheterization Operative Report  Frederick Davis [id number] 5/23/20144:26 PM [name], MD  Procedure Performed:  1. Left Heart Catheterization 2. Selective Coronary Angiography 3. Left ventricular angiogram 4. Thrombectomy RCA 5. PCI of the RCA with balloon angioplasty only 6. Angioseal RFA  Operator: Verne Carrow, MD  Indication:   79 yo male with history of prostate cancer, colon cancer admitted with inferior STEMI in cardiogenic shock with bradycardia, hypotension.                    Procedure Details: Emergency consent obtained. The patient was brought to the cath lab by EMS. The patient was not sedated secondary to hypotension/bradycardia. The right groin was prepped and draped in the usual manner. Using the modified Seldinger access technique, a 6 French sheath was placed in the right femoral artery. A 6 French sheath was placed in the right femoral vein but I could not pass a wire or temporary pacemaker into the inferior vena cava. Access was obtained in the left groin and a temporary pacemaker was placed in the left femoral vein. A temporary pacemaker was placed into the RV from the left femoral vein. Standard diagnostic catheters were used to perform selective coronary angiography. He was found to have complete occlusion of the proximal RCA.   Complex PCI. BMW wire then Whisper wire. 2.5 x 12 mm balloon in proximal vessel. Flow restored to mid vessel. Same balloon used in distal vessel x 1. Aspiration thrombectomy x 2. Flow restored to distal vessel. Double bolus Integrilin and drip. Angiomax bolus and drip. Pt arrived with BP 45-50/30. Dopamine and Levophed drips started. Temporary pacemaker for backup rate. Pt intubated for respiratory distress. Versed and Fentanyl drips for sedation. No stents placed. At the conclusion of the case, there was brisk flow down the RCA into the distal branches.   A pigtail catheter was used to perform a left ventricular  angiogram.  There were no immediate complications. The patient was taken to the CCU in critical but stable condition.   Hemodynamic Findings: Central aortic pressure: 47/30 (beginning of case), 100/50 (closing) Left ventricular pressure: 100/8/13  Angiographic Findings:  Left main:  No obstructive disease.   Left Anterior Descending Artery:  Large caliber vessel that courses to the apex. 90% mid stenosis.   Circumflex Artery: Moderate caliber vessel with early intermediate branch, patent with mild plaque.   Right Coronary Artery: 100% proximal occlusion. At the conclusion of the case, this was a large dominant vessel.   Left Ventricular Angiogram: LVEF=65%.   Impression: 1. Acute inferior STEMI secondary to occluded proximal RCA complicated by cardiogenic shock/bradycardia. 2. Successful thrombectomy, PTCA with balloon angioplasty only of the RCA 3. Severe disease in the mid LAD 4. Preserved LV systolic function  Recommendations: Will attempt to load with ASA and Brilinta tonight. Will start Brilinta 90 mg per NG tube BID in the am. To CCU, intubated. PCCM on board. Will continue Integrilin drip for 18 hours. Angiomax for two hours then stop. No beta blocker with hypotension. Will start statin.        Complications:  None. The patient tolerated the procedure well.

## 2013-03-12 NOTE — ED Notes (Signed)
Per EMS: pt from home c/o CP; pt pale diaphoretic and bradycardic; pt with colostomy bag; pt alert but lethargic; pt sts pain started after eating lunch IV L AC

## 2013-03-12 NOTE — H&P (Signed)
History and Physical  Patient ID: Frederick Davis MRN: [id number], SOB: [id number] 79 y.o. Date of Encounter: 03/12/2013, 4:15 PM  Primary Physician: [name], MD Primary Cardiologist: Rollene Rotunda. MD  Chief Complaint: chest pain  HPI: Frederick Davis is a 79 y.o. male w/ PMHx significant for hypertension, hyperlipidemia, hypothyroidism, mild cardiomegaly and atherosclerotic descending aorta on prior CXR per PCP notes, chronic kidney disease (Stage 3), h/o esophageal stricture, several remote cancers including rectal carcinoma s/p colostomy (1986),  prostate cancer (1987), renal cell carcinoma (2004) and skin cancer. He presented to [facility] via EMS on 03/12/2013 with complaints of chest pain which started after eating lunch less than an hour prior to presentation at [facility] ED. Per EMS and ED pt was noted to be pale, diaphoretic and bradycardic with EKG 40 bpm and ST segment elevations indicating inferior-anterior infarction.  He presents emergently for cardiac catheterization and temporary pacer insertion.  Of note, he is a former smoker (quit 1970), no prior known coronary disease or interventions. Family hx positive for Abdominal Aortic Aneurysm.   Past Medical History  Diagnosis Date  . Hypothyroidism   . Hypertension   . Rectum cancer 1986  . PC (prostate cancer) 1987  . Renal cell cancer   . Osteoporosis   . Esophageal stricture      Surgical History:  Past Surgical History  Procedure Laterality Date  . Kidney surgery  2004  . Radiofrequency ablation kidney  2006  . Hernia repair      2007  . Renal sphincter appliance  2007  . Colonoscopy  01/08/2012    Procedure: COLONOSCOPY;  Surgeon: Malissa Hippo, MD;  Location: [facility];  Service: Endoscopy;  Laterality: N/A;  1030  . Colostomy Left 1986    colon cancer     Home Meds: Prior to Admission medications   Medication Sig Start Date End Date Taking? Authorizing Provider  amLODipine (NORVASC)  10 MG tablet Take 10 mg by mouth daily.    Historical Provider, MD  Bioflavonoid Products (BIOFLEX) TABS Take 1 tablet by mouth daily.    Historical Provider, MD  Calcium Citrate-Vitamin D (CITRACAL + D PO) Take by mouth daily.    Historical Provider, MD  cholecalciferol (VITAMIN D) 1000 UNITS tablet Take 2,000 Units by mouth daily.    Historical Provider, MD  glucosamine-chondroitin 500-400 MG tablet Take 1 tablet by mouth daily.     Historical Provider, MD  levothyroxine (SYNTHROID, LEVOTHROID) 75 MCG tablet Take 75 mcg by mouth daily.    Historical Provider, MD  Multiple Vitamin (MULITIVITAMIN WITH MINERALS) TABS Take 1 tablet by mouth daily.    Historical Provider, MD    Allergies: No Known Allergies  History   Social History  . Marital Status: Married    Spouse Name: Alvira Philips    Number of Children: N/A  . Years of Education: N/A   Occupational History  . pharmacist-retired   . preacher-retired    Social History Main Topics  . Smoking status: Former Smoker -- 0.50 packs/day    Types: Cigarettes    Start date: 10/21/1946    Quit date: 10/21/1968  . Smokeless tobacco: Not on file  . Alcohol Use: 0.5 oz/week    1 drink(s) per week     Comment: rare, wine  . Drug Use: No  . Sexually Active: Not on file   Other Topics Concern  . Not on file   Social History Narrative  . No narrative on file  Family History  Problem Relation Age of Onset  . Stroke Mother 54  . AAA (abdominal aortic aneurysm) Father 61    died from rupture at 47 yo    Review of Systems: redacted from medical records General: ++ fatigue and malaise ENT: negative rhinorrhea Cardiovascular: negative for chest pain prior to this admission Respiratory: negative for cough or wheezing GI: ++ colostomy,no  melena GU: no hematuria, urgency, or frequency Neurologic: ++ Left arm and hand numbness while sleeping Heme: no easy bruising or bleeding Endo: negative for excessive thirst, thyroid disorder, or  flushing Musculoskeletal: ++ back pain, ++ Left arm and right hip arthralgias Psych: ++ sleep disturbance All other systems reviewed and are otherwise negative except as noted above.  Physical Exam Bp 65/45 mmHg HR 39 bpm  O2 93-100% General: Well developed, well nourished, elderly White male, alert and oriented, in  acute distress before intubation HEENT: Normocephalic, atraumatic  Neck: unable to assess Lungs:unable to assess Heart: unable to assess Abdomen: ruptured colostomy bag Extremities: unable to assess Neuro: moves all extremities spontaneously. Psych:  Responds to questions appropriately with a normal affect.   Labs:   Lab Results  Component Value Date   WBC 7.9 01/14/2013   HGB 12.7* 01/14/2013   HCT 39.7* 01/14/2013   MCV 83.4 01/14/2013   PLT 236 10/02/2012   No results found for this basename: NA, K, CL, CO2, BUN, CREATININE, CALCIUM, LABALBU, PROT, BILITOT, ALKPHOS, ALT, AST, GLUCOSE,  in the last 168 hours No results found for this basename: CKTOTAL, CKMB, TROPONINI,  in the last 72 hours Lab Results  Component Value Date   CHOL 174 01/14/2013   HDL 59 01/14/2013   LDLCALC 94 01/14/2013   TRIG 103 01/14/2013   No results found for this basename: DDIMER    Radiology/Studies:  No results found.   EKG: Rhythm strips from EMS with RAD, RBBB, ST elevations leads II, III, AVF, and V3-6, Q-wave V1  ASSESSMENT AND PLAN:  79 yo male with PMHx significant for hypertension, diet controlled hyperlipidemia, hypothyroidism, numerous cancers but no known coronary artery disease admitted with chest pain,  bradycardia, hypotension and EKG changes indicating inferior STEMI.  # Acute Myocardial Infarction, inferior: emergent cardiac catheterization with temporary pacer insertion. # Cardiogenic Shock: secondary to acute MI and bradycardia # Hypertension: hold home regimen of amlodipine # Chronic Kidney Disease, Stage 3: in setting of previous Renal Cell Carcinoma, baseline  creatine 1.5-1.6 (last GFR 37 mL/min), check creatine level for signs of AKI # Hypothyroidism: stable on levothyroxine # Hyperlipidemia: previously diet controlled, consider initiation of statin if no contraindications # Left Arm Neuropathy: normal work-up per PCP including Vit B12, Vit D check and TSH check # Anemia: Hgb 12-12.7, likely secondary to chronic disease, FOBT negative at PCP office (01/23/2013) # H/o Rectal Cancer: s/p colostomy, last colonoscopy 01/08/2012 demonstrating few diverticula and 2 small cecal polyps     Signed, Kristie Cowman , MD  Internal Medicine PGY2 03/12/2013, 4:15 PM   I have personally seen and examined this patient with Kristie Cowman, MD. I agree with the assessment and plan as outlined above. Pt presenting with acute inferior STEMI with bradycardia and hypotension due to cardiogenic shock. Plan for emergency cath with PCI. Further plans to follow.   MCALHANY,CHRISTOPHER 5:14 PM 03/12/2013

## 2013-03-12 NOTE — Progress Notes (Signed)
ANTICOAGULATION CONSULT NOTE - Initial Consult  Pharmacy Consult for Integrilin Indication: s/p PCI  No Known Allergies  Patient Measurements: Weight: 186 lb 1.1 oz (84.4 kg)   Vital Signs: BP: 89/68 mmHg (05/23 1800) Pulse Rate: 48 (05/23 1800)  Labs: No results found for this basename: HGB, HCT, PLT, APTT, LABPROT, INR, HEPARINUNFRC, CREATININE, CKTOTAL, CKMB, TROPONINI,  in the last 72 hours  The CrCl is unknown because both a height and weight (above a minimum accepted value) are required for this calculation.   Medical History: Past Medical History  Diagnosis Date  . Hypothyroidism   . Hypertension   . Rectum cancer 1986  . PC (prostate cancer) 1987  . Renal cell cancer   . Osteoporosis   . Esophageal stricture     Medications:  Prescriptions prior to admission  Medication Sig Dispense Refill  . amLODipine (NORVASC) 10 MG tablet Take 10 mg by mouth daily.      Marland Kitchen Bioflavonoid Products (BIOFLEX) TABS Take 1 tablet by mouth daily.      . Calcium Citrate-Vitamin D (CITRACAL + D PO) Take by mouth daily.      . cholecalciferol (VITAMIN D) 1000 UNITS tablet Take 2,000 Units by mouth daily.      Marland Kitchen glucosamine-chondroitin 500-400 MG tablet Take 1 tablet by mouth daily.       Marland Kitchen levothyroxine (SYNTHROID, LEVOTHROID) 75 MCG tablet Take 75 mcg by mouth daily.      . Multiple Vitamin (MULITIVITAMIN WITH MINERALS) TABS Take 1 tablet by mouth daily.        Assessment: 79 y.o. male presented earlier today with STEMI. Taken emergently to cath lab where her underwent PTCA with balloon angioplasty of RCA. To continue Integrilin for 18 hours post procedure - started at 1520 per RN.  Noted pt with h/o CKD, stage 3, baseline Creat 1.5-1.6 (GFR ~37 ml/min). SCr pending.   Plan:  1. Continue integrilin at 71mcg/kg/min for 18 hrs. 2. Will f/u platelets 8 hrs post start (~2300) 3. Will also f/u BMET being done right now and adjust dosing if needed  Christoper Fabian, PharmD, BCPS Clinical  pharmacist, pager [phone] 03/12/2013,6:22 PM

## 2013-03-12 NOTE — Procedures (Signed)
Central Venous Catheter Insertion Procedure Note JERELL DEMERY [id number] [birth year]  Procedure: Insertion of Central Venous Catheter Indications: Assessment of intravascular volume, Drug and/or fluid administration and Frequent blood sampling  Procedure Details Consent: Unable to obtain consent because of emergent medical necessity. Time Out: Verified patient identification, verified procedure, site/side was marked, verified correct patient position, special equipment/implants available, medications/allergies/relevent history reviewed, required imaging and test results available.  Performed  Maximum sterile technique was used including antiseptics, cap, gloves, gown, hand hygiene, mask and sheet. Skin prep: Chlorhexidine; local anesthetic administered A antimicrobial bonded/coated triple lumen catheter was placed in the right femoral vein due to emergent situation using the Seldinger technique.  Evaluation Blood flow good Complications: No apparent complications Patient did tolerate procedure well. Chest X-ray ordered to verify placement.  CXR: Not necessary.  YACOUB,WESAM 03/12/2013, 5:24 PM

## 2013-03-12 NOTE — Progress Notes (Signed)
Responded to code stemi; pt was in cath lab when I arrived. I updated family that dr was still working w/pt. A few minutes later dr updated family. I moved family to 2900 waiting area and notified pt's nurse. Had prayer w/pt's wife, daughter, son-in-law and grandson. Family was grateful for visit. Marjory Lies Chaplain  03/12/13 1600  Clinical Encounter Type  Visited With Family

## 2013-03-12 NOTE — ED Provider Notes (Signed)
History     CSN: [id number]  Arrival date & time 03/12/13  1428   None     No chief complaint on file.   (Consider location/radiation/quality/duration/timing/severity/associated sxs/prior treatment) HPI  Patient presents from home with chest pain. Symptoms began approximately 1.5 hours prior to calling EMS. Since onset symptoms have become severe, anterior, nonradiating  without relief from anything, including nitroglycerin en route via EMS. There is minimal associated nausea, and no emesis. Patient denies a cardiac history.  States symptoms began without clear precipitant. Patient states that he does have a history of multiple malignancies, though all are thought to be in remission. Per EMS, in route the patient's blood pressure dropped to 60/palpable, and his heart rate went into the 30s. He remained awake, alert, appropriately interactive during transport. Blood pressure improved marginally with fluids, heart rate remained similar. I discussed the patient's case with our EMS correlating team in transport. Prior to arrival and assess patient is cardiology team, and we evaluated the patient concurrently. On my exam the patient is cool, diaphoretic.   Past Medical History  Diagnosis Date  . Hypothyroidism   . Hypertension   . Rectum cancer 1986  . PC (prostate cancer) 1987  . Renal cell cancer   . Osteoporosis   . Esophageal stricture     Past Surgical History  Procedure Laterality Date  . Kidney surgery  2004  . Radiofrequency ablation kidney  2006  . Hernia repair      2007  . Renal sphincter appliance  2007  . Colonoscopy  01/08/2012    Procedure: COLONOSCOPY;  Surgeon: Malissa Hippo, MD;  Location: [facility];  Service: Endoscopy;  Laterality: N/A;  1030  . Colostomy Left 1986    colon cancer    Family History  Problem Relation Age of Onset  . Stroke Mother 19  . AAA (abdominal aortic aneurysm) Father     History  Substance Use Topics  . Smoking  status: Former Smoker -- 0.50 packs/day    Types: Cigarettes    Start date: 10/21/1946    Quit date: 10/21/1968  . Smokeless tobacco: Not on file  . Alcohol Use: 0.5 oz/week    1 drink(s) per week     Comment: rare, wine      Review of Systems  All other systems reviewed and are negative.    Allergies  Review of patient's allergies indicates no known allergies.  Home Medications   Current Outpatient Rx  Name  Route  Sig  Dispense  Refill  . amLODipine (NORVASC) 10 MG tablet   Oral   Take 10 mg by mouth daily.         Marland Kitchen Bioflavonoid Products (BIOFLEX) TABS   Oral   Take 1 tablet by mouth daily.         . Calcium Citrate-Vitamin D (CITRACAL + D PO)   Oral   Take by mouth daily.         . cholecalciferol (VITAMIN D) 1000 UNITS tablet   Oral   Take 2,000 Units by mouth daily.         Marland Kitchen glucosamine-chondroitin 500-400 MG tablet   Oral   Take 1 tablet by mouth daily.          Marland Kitchen levothyroxine (SYNTHROID, LEVOTHROID) 75 MCG tablet   Oral   Take 75 mcg by mouth daily.         . Multiple Vitamin (MULITIVITAMIN WITH MINERALS) TABS   Oral   Take  1 tablet by mouth daily.           There were no vitals taken for this visit.  Physical Exam  Nursing note and vitals reviewed. Constitutional:  Ill-appearing elderly male, receiving supplemental oxygen via nasal cannula, with colostomy bag in place, leaking.  Patient is diaphoretic  HENT:  Head: Normocephalic and atraumatic.  Cardiovascular: Bradycardia present.   Pulmonary/Chest: He has no decreased breath sounds.  Abdominal:    Psychiatric: He has a normal mood and affect. His speech is normal. Cognition and memory are normal.    ED Course  Procedures (including critical care time)  Labs Reviewed - No data to display No results found.   No diagnosis found. I evaluated the EMS rhythm strip as the patient was brought into our emergency department.  There are diffuse ST elevations inferiorly.   There is a RBBB.    Cardiac 41 sinus bradycardia abnormal Pulse ox 99% nasal cannula abnormal   During the initial evaluation the patient's blood pressure continued to decline.  Blood pressure was 60/40 prior to transfer to the catheterization lab.  MDM  This ill-appearing elderly male presents with new chest pain.  On my exam the patient is diaphoretic, uncomfortable appearing, bradycardic, hypotensive.  He also has a leaking colostomy bag. Given the description of new chest pain, his abnormal vital signs, his diaphoresis, concern for coronary occlusion given his abnormal ECG done in route via EMS, which I examined prior to the patient's evaluation, I worked with our cardiology colleagues for expeditious cath lab treatment. The patient was admitted for further evaluation and management.  CRITICAL CARE Performed by: Gerhard Munch Total critical care time: 35 Critical care time was exclusive of separately billable procedures and treating other patients. Critical care was necessary to treat or prevent imminent or life-threatening deterioration. Critical care was time spent personally by me on the following activities: development of treatment plan with patient and/or surrogate as well as nursing, discussions with consultants, evaluation of patient's response to treatment, examination of patient, obtaining history from patient or surrogate, ordering and performing treatments and interventions, ordering and review of laboratory studies, ordering and review of radiographic studies, pulse oximetry and re-evaluation of patient's condition.       Gerhard Munch, MD 03/12/13 [phone]

## 2013-03-12 NOTE — Transfer of Care (Signed)
Immediate Anesthesia Transfer of Care Note  Patient: [facility]  Procedure(s) Performed: Procedure(s): STEMI (N/A)  Patient Location: PACU and Cath Lab  Anesthesia Type:General  Level of Consciousness: sedated  Airway & Oxygen Therapy: Patient remains intubated per anesthesia plan and Patient placed on Ventilator (see vital sign flow sheet for setting)  Post-op Assessment: Report given to PACU RN and Post -op Vital signs reviewed and stable  Post vital signs: Reviewed  Complications: No apparent anesthesia complications

## 2013-03-13 ENCOUNTER — Inpatient Hospital Stay (HOSPITAL_COMMUNITY): Payer: Medicare Other

## 2013-03-13 DIAGNOSIS — I2119 ST elevation (STEMI) myocardial infarction involving other coronary artery of inferior wall: Secondary | ICD-10-CM | POA: Diagnosis not present

## 2013-03-13 DIAGNOSIS — J9819 Other pulmonary collapse: Secondary | ICD-10-CM | POA: Diagnosis not present

## 2013-03-13 DIAGNOSIS — R570 Cardiogenic shock: Secondary | ICD-10-CM | POA: Diagnosis not present

## 2013-03-13 DIAGNOSIS — G9349 Other encephalopathy: Secondary | ICD-10-CM | POA: Diagnosis not present

## 2013-03-13 DIAGNOSIS — J9600 Acute respiratory failure, unspecified whether with hypoxia or hypercapnia: Secondary | ICD-10-CM | POA: Diagnosis not present

## 2013-03-13 LAB — BASIC METABOLIC PANEL
BUN: 40 mg/dL — ABNORMAL HIGH (ref 6–23)
Calcium: 9.1 mg/dL (ref 8.4–10.5)
Creatinine, Ser: 1.88 mg/dL — ABNORMAL HIGH (ref 0.50–1.35)
GFR calc Af Amer: 35 mL/min — ABNORMAL LOW (ref >90)

## 2013-03-13 LAB — POCT I-STAT 3, ART BLOOD GAS (G3+)
Acid-base deficit: 5 mmol/L — ABNORMAL HIGH (ref 0.0–2.0)
O2 Saturation: 99 %
pCO2 arterial: 38.2 mmHg (ref 35.0–45.0)

## 2013-03-13 LAB — PROTIME-INR
INR: 1.08 (ref 0.00–1.49)
Prothrombin Time: 13.9 seconds (ref 11.6–15.2)

## 2013-03-13 LAB — CBC
HCT: 36.1 % — ABNORMAL LOW (ref 39.0–52.0)
MCH: 27.4 pg (ref 26.0–34.0)
MCHC: 32.7 g/dL (ref 30.0–36.0)
MCV: 83.8 fL (ref 78.0–100.0)
Platelets: 256 10*3/uL (ref 150–400)
RDW: 14.1 % (ref 11.5–15.5)

## 2013-03-13 LAB — LIPID PANEL
Cholesterol: 128 mg/dL (ref 0–200)
HDL: 63 mg/dL (ref >39)
Triglycerides: 101 mg/dL (ref <150)
VLDL: 20 mg/dL (ref 0–40)

## 2013-03-13 LAB — PLATELET COUNT: Platelets: 248 10*3/uL (ref 150–400)

## 2013-03-13 MED ORDER — SODIUM CHLORIDE 0.9 % IV SOLN
INTRAVENOUS | Status: DC
  Administered 2013-03-13 (×2): via INTRAVENOUS

## 2013-03-13 MED ORDER — CHLORHEXIDINE GLUCONATE 0.12 % MT SOLN
15.0000 mL | Freq: Two times a day (BID) | OROMUCOSAL | Status: DC
  Administered 2013-03-13 (×3): 15 mL via OROMUCOSAL
  Filled 2013-03-13 (×3): qty 15

## 2013-03-13 MED ORDER — BIOTENE DRY MOUTH MT LIQD
15.0000 mL | Freq: Four times a day (QID) | OROMUCOSAL | Status: DC
  Administered 2013-03-13 – 2013-03-14 (×5): 15 mL via OROMUCOSAL

## 2013-03-13 NOTE — Progress Notes (Signed)
PULMONARY  / CRITICAL CARE MEDICINE  Name: Frederick Davis MRN: [id number] DOB: 03-Feb-1923    ADMISSION DATE:  03/12/2013 CONSULTATION DATE:  03/12/13  REFERRING MD :  Dr. Clifton James PRIMARY SERVICE: Cardiology   CHIEF COMPLAINT:  Cardiac Arrest  BRIEF PATIENT DESCRIPTION: 79 y/o M with PMH of hypothyroidism, HTN, Remote Rectal / Prostate CA, Renal Cell CA, esophageal strictures, CKD 3 admitted 5/23 with complaints of chest pain.  In route became pale, diaphoretic and bradycardic with rate in 40's.  Emergently taken to cath lab with findings of acute inferior STEMI.    SIGNIFICANT EVENTS / STUDIES:  5/23 - Admit with acute inferior STEMI / cardiogenic shock / acute resp fx  LINES / TUBES: OETT 5/23>>> L Fem TLC 5/23>>> R Fem Venous Sheath>>>  CULTURES:   ANTIBIOTICS:   HISTORY OF PRESENT ILLNESS:  79 y/o M with PMH of hypothyroidism, HTN, Remote Rectal / Prostate CA, Renal Cell CA, esophageal strictures, CKD 3 admitted 5/23 with complaints of chest pain.  In route became pale, diaphoretic and bradycardic with rate in 40's.  Emergently taken to cath lab with findings of acute inferior STEMI.  Patient was hemodynamically unstable in cath lab secondary to cardiogenic shock / bradycardia in setting of inferior MI and required levophed / dopamine.  Patient intubated in cath lab per anesthesia.   PCCM consulted for post STEMI respiratory failure.    SUBJECTIVE: arousable on versed/f ent gtt Afebrile On high dose levo gtt  VITAL SIGNS: Temp:  [94.5 F (34.7 C)-99 F (37.2 C)] 99 F (37.2 C) (05/24 0830) Pulse Rate:  [24-124] 69 (05/24 0830) Resp:  [0-27] 17 (05/24 0830) BP: (77-161)/(49-94) 124/70 mmHg (05/24 0828) SpO2:  [97 %-100 %] 99 % (05/24 0830) Arterial Line BP: (74-162)/(54-79) 127/68 mmHg (05/24 0830) FiO2 (%):  [40 %-70 %] 40 % (05/24 0800) Weight:  [84.4 kg (186 lb 1.1 oz)-87.7 kg (193 lb 5.5 oz)] 87.7 kg (193 lb 5.5 oz) (05/24 0412)  HEMODYNAMICS:    VENTILATOR  SETTINGS: Vent Mode:  [-] PRVC FiO2 (%):  [40 %-70 %] 40 % Set Rate:  [16 bmp] 16 bmp Vt Set:  [520 mL] 520 mL PEEP:  [5 cmH20] 5 cmH20 Plateau Pressure:  [13 cmH20-16 cmH20] 15 cmH20 INTAKE / OUTPUT: Intake/Output     05/23 0701 - 05/24 0700 05/24 0701 - 05/25 0700   P.O.  830   I.V. (mL/kg) 1699.4 (19.4) 53 (0.6)   Total Intake(mL/kg) 1699.4 (19.4) 883 (10.1)   Urine (mL/kg/hr) 400    Total Output 400     Net +1299.4 +883          PHYSICAL EXAMINATION: General:  Elderly male on vent Neuro:  Sedate, no distress, follows commands HEENT:  OETT, mm pink/moist Cardiovascular:  s1s2 rrr, 2/6 SEM Lungs:  resp's even/non-labored on vent, lungs bilaterally clear Abdomen:  Round/soft, bsx4 hypoactive, LLQ colostomy Musculoskeletal:  No acute deformities Skin:  Warm/dry, no edema  LABS:  Recent Labs Lab 03/12/13 1458 03/12/13 1900 03/12/13 2300 03/12/13 2301 03/13/13 0334 03/13/13 0500 03/13/13 0501  HGB 10.2* 12.8*  --   --   --  11.8*  --   WBC  --  17.3*  --   --   --  15.9*  --   PLT  --  272 248  --   --  256  --   NA 143 138  --   --   --  137  --   K 4.2 4.4  --   --   --  4.7  --   CL 113* 104  --   --   --  107  --   CO2  --  18*  --   --   --  16*  --   GLUCOSE 137* 253*  --   --   --  150*  --   BUN 31* 35*  --   --   --  40*  --   CREATININE 1.60* 1.58*  --   --   --  1.88*  --   CALCIUM  --  9.3  --   --   --  9.1  --   MG  --  2.1  --   --   --   --   --   AST  --  204*  --   --   --   --   --   ALT  --  190*  --   --   --   --   --   ALKPHOS  --  110  --   --   --   --   --   BILITOT  --  0.4  --   --   --   --   --   PROT  --  6.5  --   --   --   --   --   ALBUMIN  --  3.8  --   --   --   --   --   APTT  --  77*  --   --   --   --   --   INR  --   --   --   --   --  1.08  --   LATICACIDVEN  --  2.4*  --   --   --   --   --   TROPONINI  --  9.25*  --  >20.00*  --   --  >20.00*  PHART  --   --   --   --  7.330*  --   --   PCO2ART  --   --   --   --   38.2  --   --   PO2ART  --   --   --   --  128.0*  --   --     Recent Labs Lab 03/12/13 1928  GLUCAP 210*    CXR: 5/24 >>>no edema  ASSESSMENT / PLAN:  PULMONARY A: Acute Respiratory Failure - in setting of cardiogenic shock  P:   -full vent support -consider SBT once off pressors -f/u abg / CXR in am  CARDIOVASCULAR A:  Cardiogenic Shock -  In setting of inferior MI, EF 65% ? Component of hypovolemia STEMI - occluded proximal RCA s/p thombectomy, PTCA with balloon angioplasty of RCA CAD - severe disease noted on LHC to mid LAD  P:  -recommendations per Cardiology, has underlying rhythm, poor capture - can dc pacer? Would like HR 70 range -pressors to maintain SBP > 100 & HR >50, NS @ 50/h   RENAL A:   At Risk ATN - in setting of hypotension / cardiogenic shock, contrast CKD Stage 3  P:   Hydrate gently  GASTROINTESTINAL A:   SUP  P:   -PPI -NPO -Consider early feeds if not extubated in next 24 hours  HEMATOLOGIC A:   Anemia   P:  -follow H/H -no evidence of acute bleeding, monitor closely while on integrilin etc  INFECTIOUS A:   No evidence of acute infectious process.   P:   -monitor fever curve / leukocytosis  ENDOCRINE A:   Hypothyroidism  P:   -check TSH, synthroid 75 mcg daily (home dose) -monitor glucose, not elevated on admit , add SSI with stress response  NEUROLOGIC A:   Acute Encephalopathy - in setting of NSTEMI, resp fx  P:   -monitor neuro status Fentanyl gtt for sedation , minimise versed    I have personally obtained a history, examined the patient, evaluated laboratory and imaging results, formulated the assessment and plan and placed orders.  CRITICAL CARE: The patient is critically ill with multiple organ systems failure and requires high complexity decision making for assessment and support, frequent evaluation and titration of therapies, application of advanced monitoring technologies and extensive  interpretation of multiple databases. Critical Care Time devoted to patient care services described in this note is 45 minutes.   Cyril Mourning MD. Tonny Bollman. [facility] Pager [phone] If no response call [phone]    03/13/2013, 9:00 AM

## 2013-03-13 NOTE — Progress Notes (Signed)
Attempted to place og ,  Ng tube.  Unable to place.  Meeting obstruction.  Called and notified cardiology on call dr. Gala Romney,  Pt in need of po meds .  Dr. Gala Romney states he would come and place ng tube.

## 2013-03-13 NOTE — Progress Notes (Signed)
INITIAL NUTRITION ASSESSMENT  DOCUMENTATION CODES Per approved criteria  -Obesity Unspecified   INTERVENTION: If pt remain intubated >48 hours recommend initiating tube feeds: Initiate Jevity 1.2 @ 20 ml/hr via NG tube and increase by 10 ml every 4 hours to goal rate of 20 ml/hr. 30 ml Prostat 6 times daily.  At goal rate, tube feeding regimen will provide 1176 kcal, 117 grams of protein, and 394 ml of H2O.  Provide Liquid Multivitamin daily  NUTRITION DIAGNOSIS: Inadequate oral intake related to inability to eat as evidenced by NPO status.   Goal: Enteral nutrition to provide 60-70% of estimated calorie needs (22-25 kcals/kg ideal body weight) and 100% of estimated protein needs, based on ASPEN guidelines for permissive underfeeding in critically ill obese individuals.   Monitor:  Vent Status TF initiation Weight Labs  Reason for Assessment: New Vent  79 y.o. male  Admitting Dx: Acute MI, inferior wall, initial episode of care  ASSESSMENT: 79 y.o. male w/ PMHx significant for hypertension, hyperlipidemia, hypothyroidism, mild cardiomegaly and atherosclerotic descending aorta on prior CXR per PCP notes, chronic kidney disease (Stage 3), h/o esophageal stricture, several remote cancers including rectal carcinoma s/p colostomy (1986), prostate cancer (1987), renal cell carcinoma (2004) and skin cancer. He presented to [facility] via EMS on 03/12/2013 with complaints of chest pain which started after eating lunch less than an hour prior to presentation at [facility] ED. Pt had respiratory failure s/p STEMI and required intubation. Per RN, MD is hopeful that pt will be weaned off vent tomorrow. Pt's wife present in room reports that pt was eating well PTA with no recent wt loss or decreased appetite. Per wife pt has been trying to eat a lower fat diet to help him loose wt; pt has no problems chewing or swallowing food. Wife reports that broccoli and beans cause pt to have ostomy  problems.   Height: Ht Readings from Last 1 Encounters:  03/12/13 5\' 6"  (1.676 m)    Weight: Wt Readings from Last 1 Encounters:  03/13/13 193 lb 5.5 oz (87.7 kg)    Ideal Body Weight: 142 lbs  % Ideal Body Weight: 136 %  Wt Readings from Last 10 Encounters:  03/13/13 193 lb 5.5 oz (87.7 kg)  03/13/13 193 lb 5.5 oz (87.7 kg)  01/14/13 186 lb (84.369 kg)  09/23/12 184 lb (83.462 kg)  01/08/12 172 lb (78.019 kg)  01/08/12 172 lb (78.019 kg)    Usual Body Weight: 185 lbs  % Usual Body Weight: 104%  BMI:  Body mass index is 31.22 kg/(m^2).  Patient is currently intubated on ventilator support.  MV: 8.4 Temp:Temp (24hrs), Avg:97.6 F (36.4 C), Min:94.5 F (34.7 C), Max:99 F (37.2 C)   Estimated Nutritional Needs: Kcal: 1696 Protein: 129 grams Fluid: 2.5 L  Skin: intact  Nutrition Focused Physical Exam:  Subcutaneous Fat:  Orbital Region: wnl Upper Arm Region: wnl Thoracic and Lumbar Region: NA  Muscle:  Temple Region: wnl Clavicle Bone Region: wnl Clavicle and Acromion Bone Region: wnl Scapular Bone Region: NA Dorsal Hand: wnl Patellar Region: wnl Anterior Thigh Region: wnl Posterior Calf Region: NA  Edema: none   Diet Order: NPO  EDUCATION NEEDS: -No education needs identified at this time   Intake/Output Summary (Last 24 hours) at 03/13/13 0927 Last data filed at 03/13/13 0800  Gross per 24 hour  Intake 2582.37 ml  Output    400 ml  Net 2182.37 ml    Last BM: PTA  Labs:  Recent Labs Lab 03/12/13 1458 03/12/13 1900 03/13/13 0500  NA 143 138 137  K 4.2 4.4 4.7  CL 113* 104 107  CO2  --  18* 16*  BUN 31* 35* 40*  CREATININE 1.60* 1.58* 1.88*  CALCIUM  --  9.3 9.1  MG  --  2.1  --   GLUCOSE 137* 253* 150*    CBG (last 3)   Recent Labs  03/12/13 1928  GLUCAP 210*    Scheduled Meds: . antiseptic oral rinse  15 mL Mouth Rinse QID  . aspirin  81 mg Per NG tube Daily  . atorvastatin  80 mg Per NG tube q1800  .  chlorhexidine  15 mL Mouth Rinse BID  . levothyroxine  75 mcg Oral QAC breakfast  . pantoprazole (PROTONIX) IV  40 mg Intravenous Q24H  . Ticagrelor  90 mg Oral BID    Continuous Infusions: . sodium chloride    . DOPamine 2.97 mcg/kg/min (03/13/13 0600)  . eptifibatide 1.007 mcg/kg/min (03/13/13 0600)  . fentaNYL infusion INTRAVENOUS 50 mcg/hr (03/13/13 0600)  . norepinephrine (LEVOPHED) Adult infusion 24 mcg/min (03/13/13 0800)    Past Medical History  Diagnosis Date  . Hypothyroidism   . Hypertension   . Rectum cancer 1986  . PC (prostate cancer) 1987  . Renal cell cancer   . Osteoporosis   . Esophageal stricture     Past Surgical History  Procedure Laterality Date  . Kidney surgery  2004  . Radiofrequency ablation kidney  2006  . Hernia repair      2007  . Renal sphincter appliance  2007  . Colonoscopy  01/08/2012    Procedure: COLONOSCOPY;  Surgeon: Malissa Hippo, MD;  Location: [facility];  Service: Endoscopy;  Laterality: N/A;  1030  . Colostomy Left 1986    colon cancer    Ian Malkin RD, LDN Inpatient Clinical Dietitian Pager: [phone] After Hours Pager: [phone]

## 2013-03-13 NOTE — Progress Notes (Signed)
SUBJECTIVE:  Intubated.  However, wide awake and following commands.  No distress.  Denies chest pain   PHYSICAL EXAM Filed Vitals:   03/13/13 0700 03/13/13 0800 03/13/13 0828 03/13/13 0830  BP: 103/54 124/69 124/70   Pulse: 67 69 64 69  Temp:    99 F (37.2 C)  TempSrc:    Axillary  Resp: 16 16 16 17   Height:      Weight:      SpO2: 98% 99% 99% 99%   General:  No distress Lungs:  Few basilar crackles Heart:  RRR Abdomen:  .bowel Extremities:  No edema Neuro:  Nonfocal  LABS: Lab Results  Component Value Date   TROPONINI >20.00* 03/13/2013   Results for orders placed during the hospital encounter of 03/12/13 (from the past 24 hour(s))  POCT I-STAT, CHEM 8     Status: Abnormal   Collection Time    03/12/13  2:58 PM      Result Value Range   Sodium 143  135 - 145 mEq/L   Potassium 4.2  3.5 - 5.1 mEq/L   Chloride 113 (*) 96 - 112 mEq/L   BUN 31 (*) 6 - 23 mg/dL   Creatinine, Ser 1.61 (*) 0.50 - 1.35 mg/dL   Glucose, Bld 096 (*) 70 - 99 mg/dL   Calcium, Ion 0.45  4.09 - 1.30 mmol/L   TCO2 22  0 - 100 mmol/L   Hemoglobin 10.2 (*) 13.0 - 17.0 g/dL   HCT 81.1 (*) 91.4 - 78.2 %  POCT I-STAT 3, BLOOD GAS (G3P V)     Status: Abnormal   Collection Time    03/12/13  2:59 PM      Result Value Range   pH, Ven 7.297  7.250 - 7.300   pCO2, Ven 41.3 (*) 45.0 - 50.0 mmHg   pO2, Ven 22.0 (*) 30.0 - 45.0 mmHg   Bicarbonate 20.1  20.0 - 24.0 mEq/L   TCO2 21  0 - 100 mmol/L   O2 Saturation 32.0     Acid-base deficit 6.0 (*) 0.0 - 2.0 mmol/L   Sample type VENOUS     Comment NOTIFIED PHYSICIAN    POCT ACTIVATED CLOTTING TIME     Status: None   Collection Time    03/12/13  3:26 PM      Result Value Range   Activated Clotting Time 442    MRSA PCR SCREENING     Status: None   Collection Time    03/12/13  5:00 PM      Result Value Range   MRSA by PCR NEGATIVE  NEGATIVE  URINALYSIS, ROUTINE W REFLEX MICROSCOPIC     Status: Abnormal   Collection Time    03/12/13  6:12 PM    Result Value Range   Color, Urine YELLOW  YELLOW   APPearance TURBID (*) CLEAR   Specific Gravity, Urine 1.020  1.005 - 1.030   pH 5.0  5.0 - 8.0   Glucose, UA NEGATIVE  NEGATIVE mg/dL   Hgb urine dipstick NEGATIVE  NEGATIVE   Bilirubin Urine SMALL (*) NEGATIVE   Ketones, ur 15 (*) NEGATIVE mg/dL   Protein, ur >956 (*) NEGATIVE mg/dL   Urobilinogen, UA 1.0  0.0 - 1.0 mg/dL   Nitrite NEGATIVE  NEGATIVE   Leukocytes, UA NEGATIVE  NEGATIVE  URINE MICROSCOPIC-ADD ON     Status: None   Collection Time    03/12/13  6:12 PM      Result Value Range  Squamous Epithelial / LPF RARE  RARE   WBC, UA 0-2  <3 WBC/hpf   RBC / HPF 0-2  <3 RBC/hpf   Bacteria, UA RARE  RARE   Urine-Other AMORPHOUS URATES/PHOSPHATES    TROPONIN I     Status: Abnormal   Collection Time    03/12/13  7:00 PM      Result Value Range   Troponin I 9.25 (*) <0.30 ng/mL  APTT     Status: Abnormal   Collection Time    03/12/13  7:00 PM      Result Value Range   aPTT 77 (*) 24 - 37 seconds  CBC WITH DIFFERENTIAL     Status: Abnormal   Collection Time    03/12/13  7:00 PM      Result Value Range   WBC 17.3 (*) 4.0 - 10.5 K/uL   RBC 4.69  4.22 - 5.81 MIL/uL   Hemoglobin 12.8 (*) 13.0 - 17.0 g/dL   HCT 41.3  24.4 - 01.0 %   MCV 84.6  78.0 - 100.0 fL   MCH 27.3  26.0 - 34.0 pg   MCHC 32.2  30.0 - 36.0 g/dL   RDW 27.2  53.6 - 64.4 %   Platelets 272  150 - 400 K/uL   Neutrophils Relative % 87 (*) 43 - 77 %   Neutro Abs 14.9 (*) 1.7 - 7.7 K/uL   Lymphocytes Relative 6 (*) 12 - 46 %   Lymphs Abs 1.1  0.7 - 4.0 K/uL   Monocytes Relative 7  3 - 12 %   Monocytes Absolute 1.2 (*) 0.1 - 1.0 K/uL   Eosinophils Relative 0  0 - 5 %   Eosinophils Absolute 0.0  0.0 - 0.7 K/uL   Basophils Relative 0  0 - 1 %   Basophils Absolute 0.0  0.0 - 0.1 K/uL  COMPREHENSIVE METABOLIC PANEL     Status: Abnormal   Collection Time    03/12/13  7:00 PM      Result Value Range   Sodium 138  135 - 145 mEq/L   Potassium 4.4  3.5 - 5.1  mEq/L   Chloride 104  96 - 112 mEq/L   CO2 18 (*) 19 - 32 mEq/L   Glucose, Bld 253 (*) 70 - 99 mg/dL   BUN 35 (*) 6 - 23 mg/dL   Creatinine, Ser 0.34 (*) 0.50 - 1.35 mg/dL   Calcium 9.3  8.4 - 74.2 mg/dL   Total Protein 6.5  6.0 - 8.3 g/dL   Albumin 3.8  3.5 - 5.2 g/dL   AST 595 (*) 0 - 37 U/L   ALT 190 (*) 0 - 53 U/L   Alkaline Phosphatase 110  39 - 117 U/L   Total Bilirubin 0.4  0.3 - 1.2 mg/dL   GFR calc non Af Amer 37 (*) >90 mL/min   GFR calc Af Amer 43 (*) >90 mL/min  MAGNESIUM     Status: None   Collection Time    03/12/13  7:00 PM      Result Value Range   Magnesium 2.1  1.5 - 2.5 mg/dL  TSH     Status: None   Collection Time    03/12/13  7:00 PM      Result Value Range   TSH 2.235  0.350 - 4.500 uIU/mL  CORTISOL     Status: None   Collection Time    03/12/13  7:00 PM      Result  Value Range   Cortisol, Plasma 35.4    LACTIC ACID, PLASMA     Status: Abnormal   Collection Time    03/12/13  7:00 PM      Result Value Range   Lactic Acid, Venous 2.4 (*) 0.5 - 2.2 mmol/L  GLUCOSE, CAPILLARY     Status: Abnormal   Collection Time    03/12/13  7:28 PM      Result Value Range   Glucose-Capillary 210 (*) 70 - 99 mg/dL   Comment 1 Documented in Chart     Comment 2 Notify RN    PLATELET COUNT     Status: None   Collection Time    03/12/13 11:00 PM      Result Value Range   Platelets 248  150 - 400 K/uL  TROPONIN I     Status: Abnormal   Collection Time    03/12/13 11:01 PM      Result Value Range   Troponin I >20.00 (*) <0.30 ng/mL  POCT I-STAT 3, BLOOD GAS (G3+)     Status: Abnormal   Collection Time    03/13/13  3:34 AM      Result Value Range   pH, Arterial 7.330 (*) 7.350 - 7.450   pCO2 arterial 38.2  35.0 - 45.0 mmHg   pO2, Arterial 128.0 (*) 80.0 - 100.0 mmHg   Bicarbonate 20.1  20.0 - 24.0 mEq/L   TCO2 21  0 - 100 mmol/L   O2 Saturation 99.0     Acid-base deficit 5.0 (*) 0.0 - 2.0 mmol/L   Patient temperature 98.6 F     Collection site RADIAL,  ALLEN'S TEST ACCEPTABLE     Drawn by RT     Sample type ARTERIAL    CBC     Status: Abnormal   Collection Time    03/13/13  5:00 AM      Result Value Range   WBC 15.9 (*) 4.0 - 10.5 K/uL   RBC 4.31  4.22 - 5.81 MIL/uL   Hemoglobin 11.8 (*) 13.0 - 17.0 g/dL   HCT 40.9 (*) 81.1 - 91.4 %   MCV 83.8  78.0 - 100.0 fL   MCH 27.4  26.0 - 34.0 pg   MCHC 32.7  30.0 - 36.0 g/dL   RDW 78.2  95.6 - 21.3 %   Platelets 256  150 - 400 K/uL  BASIC METABOLIC PANEL     Status: Abnormal   Collection Time    03/13/13  5:00 AM      Result Value Range   Sodium 137  135 - 145 mEq/L   Potassium 4.7  3.5 - 5.1 mEq/L   Chloride 107  96 - 112 mEq/L   CO2 16 (*) 19 - 32 mEq/L   Glucose, Bld 150 (*) 70 - 99 mg/dL   BUN 40 (*) 6 - 23 mg/dL   Creatinine, Ser 0.86 (*) 0.50 - 1.35 mg/dL   Calcium 9.1  8.4 - 57.8 mg/dL   GFR calc non Af Amer 30 (*) >90 mL/min   GFR calc Af Amer 35 (*) >90 mL/min  PROTIME-INR     Status: None   Collection Time    03/13/13  5:00 AM      Result Value Range   Prothrombin Time 13.9  11.6 - 15.2 seconds   INR 1.08  0.00 - 1.49  LIPID PANEL     Status: None   Collection Time    03/13/13  5:00 AM  Result Value Range   Cholesterol 128  0 - 200 mg/dL   Triglycerides 161  <096 mg/dL   HDL 63  >04 mg/dL   Total CHOL/HDL Ratio 2.0     VLDL 20  0 - 40 mg/dL   LDL Cholesterol 45  0 - 99 mg/dL  TROPONIN I     Status: Abnormal   Collection Time    03/13/13  5:01 AM      Result Value Range   Troponin I >20.00 (*) <0.30 ng/mL    Intake/Output Summary (Last 24 hours) at 03/13/13 1006 Last data filed at 03/13/13 0800  Gross per 24 hour  Intake 2582.37 ml  Output    400 ml  Net 2182.37 ml    EKG:  No acute ST T wave changes.  NSR.  Rate 77.  03/13/2013  CXR:  Support apparatus satisfactory. Suboptimal inspiration which accounts for atelectasis in the lung bases, left greater than right, though aeration has improved since yesterday. No new abnormalities.   ASSESSMENT  AND PLAN:   Acute MI, inferior wall, initial episode of care:  Status post emergent PCI stent.  Temp wire.  I will pull this today as he has had no significant bradycardia.   Weaning vasopressors.    Colostomy in place/Prostate cancer/Renal cell carcinoma, Right/SCCA (squamous cell carcinoma) of skin  CKD (chronic kidney disease) stage 3, GFR 30-59 ml/min:  Creat up slightly.  Follow.    Respiratory failure:  Plans per CCM.  On vent.    Fayrene Fearing [facility] 03/13/2013 10:06 AM

## 2013-03-14 ENCOUNTER — Encounter (HOSPITAL_COMMUNITY): Payer: Self-pay | Admitting: *Deleted

## 2013-03-14 ENCOUNTER — Inpatient Hospital Stay (HOSPITAL_COMMUNITY): Payer: Medicare Other

## 2013-03-14 DIAGNOSIS — I2119 ST elevation (STEMI) myocardial infarction involving other coronary artery of inferior wall: Secondary | ICD-10-CM | POA: Diagnosis not present

## 2013-03-14 DIAGNOSIS — R918 Other nonspecific abnormal finding of lung field: Secondary | ICD-10-CM | POA: Diagnosis not present

## 2013-03-14 DIAGNOSIS — R570 Cardiogenic shock: Secondary | ICD-10-CM | POA: Diagnosis not present

## 2013-03-14 DIAGNOSIS — G9349 Other encephalopathy: Secondary | ICD-10-CM | POA: Diagnosis not present

## 2013-03-14 DIAGNOSIS — J9600 Acute respiratory failure, unspecified whether with hypoxia or hypercapnia: Secondary | ICD-10-CM | POA: Diagnosis not present

## 2013-03-14 LAB — URINE CULTURE
Colony Count: NO GROWTH
Culture: NO GROWTH

## 2013-03-14 LAB — BLOOD GAS, ARTERIAL
Acid-base deficit: 5.2 mmol/L — ABNORMAL HIGH (ref 0.0–2.0)
FIO2: 0.4 %
MECHVT: 520 mL
TCO2: 18.3 mmol/L (ref 0–100)
pCO2 arterial: 23.1 mmHg — ABNORMAL LOW (ref 35.0–45.0)
pH, Arterial: 7.493 — ABNORMAL HIGH (ref 7.350–7.450)
pO2, Arterial: 131 mmHg — ABNORMAL HIGH (ref 80.0–100.0)

## 2013-03-14 LAB — CBC
MCH: 27.4 pg (ref 26.0–34.0)
MCHC: 32.7 g/dL (ref 30.0–36.0)
MCV: 83.8 fL (ref 78.0–100.0)
Platelets: 182 10*3/uL (ref 150–400)
RDW: 14.4 % (ref 11.5–15.5)

## 2013-03-14 LAB — BASIC METABOLIC PANEL
BUN: 32 mg/dL — ABNORMAL HIGH (ref 6–23)
CO2: 17 mEq/L — ABNORMAL LOW (ref 19–32)
Calcium: 8.9 mg/dL (ref 8.4–10.5)
Creatinine, Ser: 1.62 mg/dL — ABNORMAL HIGH (ref 0.50–1.35)
GFR calc non Af Amer: 36 mL/min — ABNORMAL LOW (ref >90)
Glucose, Bld: 92 mg/dL (ref 70–99)
Sodium: 138 mEq/L (ref 135–145)

## 2013-03-14 LAB — TROPONIN I: Troponin I: 15.01 ng/mL (ref <0.30)

## 2013-03-14 MED ORDER — PANTOPRAZOLE SODIUM 40 MG PO TBEC
40.0000 mg | DELAYED_RELEASE_TABLET | Freq: Every day | ORAL | Status: DC
  Administered 2013-03-14 – 2013-03-18 (×5): 40 mg via ORAL
  Filled 2013-03-14 (×5): qty 1

## 2013-03-14 MED ORDER — MENTHOL 3 MG MT LOZG
1.0000 | LOZENGE | OROMUCOSAL | Status: DC | PRN
  Administered 2013-03-14: 3 mg via ORAL
  Filled 2013-03-14: qty 9

## 2013-03-14 MED ORDER — ALPRAZOLAM 0.25 MG PO TABS
0.2500 mg | ORAL_TABLET | Freq: Once | ORAL | Status: AC
  Administered 2013-03-14: 0.25 mg via ORAL
  Filled 2013-03-14: qty 1

## 2013-03-14 NOTE — Progress Notes (Addendum)
79 yo male with PMHx significant for hypertension, diet controlled hyperlipidemia, hypothyroidism, numerous cancers but no known coronary artery disease admitted with cardiogenic shock secondary to inferior STEMI now s/p cardiac catheterization with PCI of RCA with balloon angioplasty only, thrombectomy of RCA and Angioseal RFA  SUBJECTIVE:  Extubated this morning. No complaints on exam, denies chest pain or sob; no acute events overnight per nursing.   PHYSICAL EXAM Filed Vitals:   03/14/13 0500 03/14/13 0600 03/14/13 0800 03/14/13 0820  BP: 110/66 95/60  123/60  Pulse: 59 61  69  Temp:      TempSrc:   Oral   Resp: 16 15  14   Height:      Weight:      SpO2: 100% 100%  100%   Physical Exam  General: Well developed, well nourished, pleasant, elderly White male, alert and oriented,NAD HEENT: Normocephalic, atraumatic  Neck: no JVD appreciated Lungs: scattered crackles anterior fields Heart: RRR no M/R/G appreciated Abdomen: colostomy bag intact Extremities: warm, no edema, SCDs in place  Neuro: moves all extremities spontaneously.  Psych: Responds to questions appropriately with a normal affect.   LABS: Lab Results  Component Value Date   TROPONINI >20.00* 03/13/2013   Results for orders placed during the hospital encounter of 03/12/13 (from the past 24 hour(s))  BLOOD GAS, ARTERIAL     Status: Abnormal   Collection Time    03/14/13  4:15 AM      Result Value Range   FIO2 0.40     Delivery systems VENTILATOR     Mode PRESSURE REGULATED VOLUME CONTROL     VT 520     Rate 16     Peep/cpap 5.0     pH, Arterial 7.493 (*) 7.350 - 7.450   pCO2 arterial 23.1 (*) 35.0 - 45.0 mmHg   pO2, Arterial 131.0 (*) 80.0 - 100.0 mmHg   Bicarbonate 17.6 (*) 20.0 - 24.0 mEq/L   TCO2 18.3  0 - 100 mmol/L   Acid-base deficit 5.2 (*) 0.0 - 2.0 mmol/L   O2 Saturation 100.0     Patient temperature 98.6     Collection site A-LINE     Drawn by [id number]     Sample type ARTERIAL DRAW    BASIC  METABOLIC PANEL     Status: Abnormal   Collection Time    03/14/13  5:00 AM      Result Value Range   Sodium 138  135 - 145 mEq/L   Potassium 3.7  3.5 - 5.1 mEq/L   Chloride 110  96 - 112 mEq/L   CO2 17 (*) 19 - 32 mEq/L   Glucose, Bld 92  70 - 99 mg/dL   BUN 32 (*) 6 - 23 mg/dL   Creatinine, Ser 0.45 (*) 0.50 - 1.35 mg/dL   Calcium 8.9  8.4 - 40.9 mg/dL   GFR calc non Af Amer 36 (*) >90 mL/min   GFR calc Af Amer 42 (*) >90 mL/min  CBC     Status: Abnormal   Collection Time    03/14/13  5:00 AM      Result Value Range   WBC 10.4  4.0 - 10.5 K/uL   RBC 3.51 (*) 4.22 - 5.81 MIL/uL   Hemoglobin 9.6 (*) 13.0 - 17.0 g/dL   HCT 81.1 (*) 91.4 - 78.2 %   MCV 83.8  78.0 - 100.0 fL   MCH 27.4  26.0 - 34.0 pg   MCHC 32.7  30.0 - 36.0  g/dL   RDW 16.1  09.6 - 04.5 %   Platelets 182  150 - 400 K/uL    Intake/Output Summary (Last 24 hours) at 03/14/13 0900 Last data filed at 03/14/13 0800  Gross per 24 hour  Intake 2281.49 ml  Output   1145 ml  Net 1136.49 ml    EKG:  No acute ST T wave changes.  NSR.  Rate 77.  03/14/2013  CXR:  Support apparatus satisfactory. Suboptimal inspiration which accounts for atelectasis in the lung bases, left greater than right, though aeration has improved since yesterday. No new abnormalities.  03/12/2013 LHC Hemodynamic Findings:  Central aortic pressure: 47/30 (beginning of case), 100/50 (closing)  Left ventricular pressure: 100/8/13  Angiographic Findings:  Left main: No obstructive disease.  Left Anterior Descending Artery: Large caliber vessel that courses to the apex. 90% mid stenosis.  Circumflex Artery: Moderate caliber vessel with early intermediate branch, patent with mild plaque.  Right Coronary Artery: 100% proximal occlusion. At the conclusion of the case, this was a large dominant vessel.  Left Ventricular Angiogram: LVEF=65%.   Impression:  1. Acute inferior STEMI secondary to occluded proximal RCA complicated by cardiogenic  shock/bradycardia.  2. Successful thrombectomy, PTCA with balloon angioplasty only of the RCA  3. Severe disease in the mid LAD  4. Preserved LV systolic function  ASSESSMENT AND PLAN: 79 yo male admitted with cardiogenic shock secondary to inferior STEMI.  # Acute MI, inferior wall, initial episode of care:  Status post emergent PCI stent.  Temp wire pulled yesterday. No significant bradycardia with HR 59-73 bpm.   Off levophed and weaning dopamine. S/p Integrilin and Angiomax. On Statin (atorvastatin 80 mg qd), ASA (81 mg qd)  and Brilinta (90 mg bid). Beta-blockers held secondary to hypotension. Bp this am 123/60 mmHg, pulse 60 bpm. -consider starting low-dose metoprolol as bp permits   Recent Labs Lab 03/12/13 1900 03/12/13 2301 03/13/13 0501  TROPONINI 9.25* >20.00* >20.00*   # Cardiogenic Shock: secondary to acute MI and bradycardia   # Respiratory failure:  Secondary to cardiogenic shock, now extubated  # Hypertension: home regimen of amlodipine held, BB deferred in setting of hypotension now improved Filed Vitals:   03/14/13 0500 03/14/13 0600 03/14/13 0800 03/14/13 0820  BP: 110/66 95/60  123/60  Pulse: 59 61  69  Temp:      TempSrc:   Oral   Resp: 16 15  14   Height:      Weight:      SpO2: 100% 100%  100%    # Chronic Kidney Disease, Stage 3: in setting of previous Renal Cell Carcinoma, baseline creatine 1.5-1.6 (last GFR 37 mL/min), creatine today at baseline  Recent Labs Lab 03/12/13 1458 03/12/13 1900 03/13/13 0500 03/14/13 0500  CREATININE 1.60* 1.58* 1.88* 1.62*   # Hypothyroidism: stable on levothyroxine   # Hyperlipidemia: previously diet controlled, high dose statin initiated  # H/o Rectal Cancer: s/p colostomy, last colonoscopy 01/08/2012 demonstrating few diverticula and 2 small cecal polyps  # H/o Prostate cancer: s/p prostatectomy   SCHOOLER, KAREN 03/14/2013 9:00 AM  History and all data above reviewed.  Patient examined.  I agree with  the findings as above.  He is extubated and feeling OK.  He denies any chest pain.  The patient exam reveals COR:RRR  ,  Lungs: Diffuse coarse crackles  ,  Abd: Positive bowel sounds, no rebound no guarding, Ext No edema  .  All available labs, radiology testing, previous records reviewed. Agree  with documented assessment and plan. He is doing well.  We will remove the arterial line (I think his BP is higher than the art line suggests).  Wean dopamine based on cuff BP.  We will order a swallow study to see if the NG can be removed.  We will hold off on titrating beta blocker but begin if his HR remains OK.  Pacer wire was removed yesterday.  CXR with some vascular congestion.  We will KVO the fluid but not diurese as he had some RV infarct physiology.  We will need to watch the volume closely.  Fayrene Fearing [facility]  10:48 AM  03/14/2013

## 2013-03-14 NOTE — Progress Notes (Signed)
PULMONARY  / CRITICAL CARE MEDICINE  Name: Frederick Davis MRN: [id number] DOB: Sep 15, 1923    ADMISSION DATE:  03/12/2013 CONSULTATION DATE:  03/12/13  REFERRING MD :  Dr. Clifton James PRIMARY SERVICE: Cardiology   CHIEF COMPLAINT:  Cardiac Arrest  BRIEF PATIENT DESCRIPTION: 79 y/o M with PMH of hypothyroidism, HTN, Remote Rectal / Prostate CA, Renal Cell CA, esophageal strictures, CKD 3 admitted 5/23 with complaints of chest pain.  In route became pale, diaphoretic and bradycardic with rate in 40's.  Emergently taken to cath lab with findings of acute inferior STEMI.    SIGNIFICANT EVENTS / STUDIES:  5/23 - Admit with acute inferior STEMI / cardiogenic shock / acute resp fx  LINES / TUBES: OETT 5/23>>>5/25 L Fem TLC 5/23>>> R Fem Venous Sheath>>>  CULTURES:   ANTIBIOTICS:   HISTORY OF PRESENT ILLNESS:  79 y/o M with PMH of hypothyroidism, HTN, Remote Rectal / Prostate CA, Renal Cell CA, esophageal strictures, CKD 3 admitted 5/23 with complaints of chest pain.  In route became pale, diaphoretic and bradycardic with rate in 40's.  Emergently taken to cath lab with findings of acute inferior STEMI.  Patient was hemodynamically unstable in cath lab secondary to cardiogenic shock / bradycardia in setting of inferior MI and required levophed / dopamine.  Patient intubated in cath lab per anesthesia.   PCCM consulted for post STEMI respiratory failure.    SUBJECTIVE: arousable off f ent gtt Afebrile Off levo gtt denis CP, dyspnea Tolerates PS 5/5  VITAL SIGNS: Temp:  [98.9 F (37.2 C)-99.8 F (37.7 C)] 99.4 F (37.4 C) (05/25 0000) Pulse Rate:  [59-73] 69 (05/25 0820) Resp:  [0-25] 14 (05/25 0820) BP: (84-127)/(51-85) 123/60 mmHg (05/25 0820) SpO2:  [98 %-100 %] 100 % (05/25 0820) Arterial Line BP: (91-143)/(48-69) 97/60 mmHg (05/25 0600) FiO2 (%):  [40 %] 40 % (05/25 0820) Weight:  [87.7 kg (193 lb 5.5 oz)] 87.7 kg (193 lb 5.5 oz) (05/25 0400)  HEMODYNAMICS:    VENTILATOR  SETTINGS: Vent Mode:  [-] PSV;CPAP FiO2 (%):  [40 %] 40 % Set Rate:  [16 bmp] 16 bmp Vt Set:  [520 mL] 520 mL PEEP:  [5 cmH20] 5 cmH20 Pressure Support:  [5 cmH20] 5 cmH20 Plateau Pressure:  [13 cmH20-14 cmH20] 14 cmH20 INTAKE / OUTPUT: Intake/Output     05/24 0701 - 05/25 0700 05/25 0701 - 05/26 0700   P.O. 830    I.V. (mL/kg) 2266.7 (25.8) 82.2 (0.9)   Total Intake(mL/kg) 3096.7 (35.3) 82.2 (0.9)   Urine (mL/kg/hr) 1350 (0.6) 75 (0.5)   Total Output 1350 75   Net +1746.7 +7.2          PHYSICAL EXAMINATION: General:  Elderly male on vent Neuro:   no distress, follows commands HEENT:  OETT, mm pink/moist Cardiovascular:  s1s2 rrr, 2/6 SEM Lungs:  resp's even/non-labored on vent, lungs bilaterally clear Abdomen:  Round/soft, bsx4 hypoactive, LLQ colostomy Musculoskeletal:  No acute deformities Skin:  Warm/dry, no edema  LABS:  Recent Labs Lab 03/12/13 1458  03/12/13 1900 03/12/13 2300 03/12/13 2301 03/13/13 0334 03/13/13 0500 03/13/13 0501 03/14/13 0415 03/14/13 0500  HGB 10.2*  --  12.8*  --   --   --  11.8*  --   --  9.6*  WBC  --   --  17.3*  --   --   --  15.9*  --   --  10.4  PLT  --   < > 272 248  --   --  256  --   --  182  NA 143  --  138  --   --   --  137  --   --  138  K 4.2  --  4.4  --   --   --  4.7  --   --  3.7  CL 113*  --  104  --   --   --  107  --   --  110  CO2  --   --  18*  --   --   --  16*  --   --  17*  GLUCOSE 137*  --  253*  --   --   --  150*  --   --  92  BUN 31*  --  35*  --   --   --  40*  --   --  32*  CREATININE 1.60*  --  1.58*  --   --   --  1.88*  --   --  1.62*  CALCIUM  --   --  9.3  --   --   --  9.1  --   --  8.9  MG  --   --  2.1  --   --   --   --   --   --   --   AST  --   --  204*  --   --   --   --   --   --   --   ALT  --   --  190*  --   --   --   --   --   --   --   ALKPHOS  --   --  110  --   --   --   --   --   --   --   BILITOT  --   --  0.4  --   --   --   --   --   --   --   PROT  --   --  6.5  --   --    --   --   --   --   --   ALBUMIN  --   --  3.8  --   --   --   --   --   --   --   APTT  --   --  77*  --   --   --   --   --   --   --   INR  --   --   --   --   --   --  1.08  --   --   --   LATICACIDVEN  --   --  2.4*  --   --   --   --   --   --   --   TROPONINI  --   --  9.25*  --  >20.00*  --   --  >20.00*  --   --   PHART  --   --   --   --   --  7.330*  --   --  7.493*  --   PCO2ART  --   --   --   --   --  38.2  --   --  23.1*  --   PO2ART  --   --   --   --   --  128.0*  --   --  131.0*  --   < > = values in this interval not displayed.  Recent Labs Lab 03/12/13 1928  GLUCAP 210*    CXR: 5/25 >>>no edema  ASSESSMENT / PLAN:  PULMONARY A: Acute Respiratory Failure - in setting of cardiogenic shock  P:   -extubate  CARDIOVASCULAR A:  Cardiogenic Shock -  In setting of inferior MI, EF 65% ? Component of hypovolemia STEMI - occluded proximal RCA s/p thombectomy, PTCA with balloon angioplasty of RCA CAD - severe disease noted on LHC to mid LAD Transient pacer - out P:   - Off pressors, NS @ 50/h   RENAL A:   At Risk ATN - in setting of hypotension / cardiogenic shock, contrast CKD Stage 3  P:   Good UO  GASTROINTESTINAL A:   SUP  P:   -PPI -sips & chips post extubation  HEMATOLOGIC A:   Anemia   P:  -follow H/H -no evidence of acute bleeding, monitor closely while on integrilin etc  INFECTIOUS A:   No evidence of acute infectious process.   P:   -monitor fever curve / leukocytosis  ENDOCRINE A:   Hypothyroidism, TSH ok  P:   - synthroid 75 mcg daily (home dose) - add SSI with stress response  NEUROLOGIC A:   Acute Encephalopathy - in setting of NSTEMI, resp fx, resolved  P:   -dcsedation     I have personally obtained a history, examined the patient, evaluated laboratory and imaging results, formulated the assessment and plan and placed orders.  CRITICAL CARE: The patient is critically ill with multiple organ systems  failure and requires high complexity decision making for assessment and support, frequent evaluation and titration of therapies, application of advanced monitoring technologies and extensive interpretation of multiple databases. Critical Care Time devoted to patient care services described in this note is 35 minutes.   Cyril Mourning MD. Tonny Bollman. [place] [facility] Pager [phone] If no response call [phone]    03/14/2013, 8:42 AM

## 2013-03-14 NOTE — Progress Notes (Signed)
Chaplain visited pt and family in response to a referral by fellow chaplain. Pt was intubated and limited in communicating verbally. Chaplain provided ministry of presence and shared words of hope with son. Chaplain assured them of his support and will follow up as needed. Son thanked chaplain for the visit and presence. Kelle Darting [phone]  03/13/13 1405  Clinical Encounter Type  Visited With Patient and family together  Visit Type Initial;Spiritual support;Critical Care  Referral From Chaplain  Spiritual Encounters  Spiritual Needs Emotional  Stress Factors  Patient Stress Factors Health changes  Family Stress Factors Major life changes

## 2013-03-14 NOTE — Progress Notes (Signed)
Venous sheath d/c'd per protocol from Lt groin site after Dr removed transvenous PM .Site well wnl .gauze drsg applied to site after pressure held x 15 min .

## 2013-03-14 NOTE — Procedures (Signed)
Extubation Procedure Note  Patient Details:   Name: Frederick Davis DOB: 1923-08-21 MRN: [id number]   Airway Documentation:     Evaluation  O2 sats: stable throughout Complications: No apparent complications Patient did tolerate procedure well. Bilateral Breath Sounds: Clear Suctioning: Airway Yes  extuation per MD order, suctioned mouth out throughly, deflated cuff with positive cuff leak placed on 2LNC. Vitals WNL HR-81 100% 142/74, clear bilateral breathsounds, no stridor present. RT will continue to monitor  Adolm Joseph 03/14/2013, 8:57 AM

## 2013-03-15 DIAGNOSIS — R570 Cardiogenic shock: Secondary | ICD-10-CM | POA: Diagnosis not present

## 2013-03-15 DIAGNOSIS — I10 Essential (primary) hypertension: Secondary | ICD-10-CM | POA: Diagnosis not present

## 2013-03-15 DIAGNOSIS — J9600 Acute respiratory failure, unspecified whether with hypoxia or hypercapnia: Secondary | ICD-10-CM | POA: Diagnosis not present

## 2013-03-15 MED ORDER — RESOURCE THICKENUP CLEAR PO POWD
ORAL | Status: DC | PRN
  Filled 2013-03-15 (×3): qty 125

## 2013-03-15 MED ORDER — LORAZEPAM 2 MG/ML IJ SOLN
1.0000 mg | Freq: Once | INTRAMUSCULAR | Status: AC
  Administered 2013-03-16: 1 mg via INTRAVENOUS
  Filled 2013-03-15: qty 1

## 2013-03-15 MED ORDER — STARCH (THICKENING) PO POWD: ORAL | Status: DC | PRN

## 2013-03-15 MED ORDER — STARCH (THICKENING) PO POWD
ORAL | Status: DC | PRN
Start: 2013-03-15 — End: 2013-03-15

## 2013-03-15 NOTE — Progress Notes (Signed)
PULMONARY  / CRITICAL CARE MEDICINE  Name: Frederick Davis MRN: [id number] DOB: [birth year]    ADMISSION DATE:  03/12/2013 CONSULTATION DATE:  03/12/13  REFERRING MD :  Dr. Clifton James PRIMARY SERVICE: Cardiology   CHIEF COMPLAINT:  Cardiac Arrest  BRIEF PATIENT DESCRIPTION: 79 y/o M with PMH of hypothyroidism, HTN, Remote Rectal / Prostate CA, Renal Cell CA, esophageal strictures, CKD 3 admitted 5/23 with complaints of chest pain.  In route became pale, diaphoretic and bradycardic with rate in 40's.  Emergently taken to cath lab with findings of acute inferior STEMI.    SIGNIFICANT EVENTS / STUDIES:  5/23 - Admit with acute inferior STEMI / cardiogenic shock / acute resp fx  LINES / TUBES: OETT 5/23>>>5/25 L Fem TLC 5/23>>> R Fem Venous Sheath>>>  CULTURES:   ANTIBIOTICS:   HISTORY OF PRESENT ILLNESS:  79 y/o M with PMH of hypothyroidism, HTN, Remote Rectal / Prostate CA, Renal Cell CA, esophageal strictures, CKD 3 admitted 5/23 with complaints of chest pain.  In route became pale, diaphoretic and bradycardic with rate in 40's.  Emergently taken to cath lab with findings of acute inferior STEMI.  Patient was hemodynamically unstable in cath lab secondary to cardiogenic shock / bradycardia in setting of inferior MI and required levophed / dopamine.  Patient intubated in cath lab per anesthesia.   PCCM consulted for post STEMI respiratory failure.    SUBJECTIVE:  Afebrile denies CP, dyspnea   VITAL SIGNS: Temp:  [98.6 F (37 C)-99.1 F (37.3 C)] 99.1 F (37.3 C) (05/26 0800) Pulse Rate:  [68-93] 72 (05/26 0800) Resp:  [3-32] 17 (05/26 0800) BP: (117-148)/(65-95) 144/82 mmHg (05/26 0800) SpO2:  [92 %-100 %] 100 % (05/26 0800) Weight:  [87.6 kg (193 lb 2 oz)] 87.6 kg (193 lb 2 oz) (05/26 0500)  HEMODYNAMICS:    VENTILATOR SETTINGS:   INTAKE / OUTPUT: Intake/Output     05/25 0701 - 05/26 0700 05/26 0701 - 05/27 0700   P.O. 50    I.V. (mL/kg) 343.2 (3.9)    NG/GT 60    Total Intake(mL/kg) 453.2 (5.2)    Urine (mL/kg/hr) 1425 (0.7)    Total Output 1425     Net -971.8            PHYSICAL EXAMINATION: General:  Elderly male on vent Neuro:   interactive, non focal HEENT:  OETT, mm pink/moist Cardiovascular:  s1s2 rrr, 2/6 SEM Lungs:  resp's even/non-labored on vent, lungs bilaterally clear Abdomen:  Round/soft, bsx4 hypoactive, LLQ colostomy Musculoskeletal:  No acute deformities Skin:  Warm/dry, no edema  LABS:  Recent Labs Lab 03/12/13 1458  03/12/13 1900 03/12/13 2300 03/12/13 2301 03/13/13 0334 03/13/13 0500 03/13/13 0501 03/14/13 0415 03/14/13 0500 03/14/13 1219  HGB 10.2*  --  12.8*  --   --   --  11.8*  --   --  9.6*  --   WBC  --   --  17.3*  --   --   --  15.9*  --   --  10.4  --   PLT  --   < > 272 248  --   --  256  --   --  182  --   NA 143  --  138  --   --   --  137  --   --  138  --   K 4.2  --  4.4  --   --   --  4.7  --   --  3.7  --   CL 113*  --  104  --   --   --  107  --   --  110  --   CO2  --   --  18*  --   --   --  16*  --   --  17*  --   GLUCOSE 137*  --  253*  --   --   --  150*  --   --  92  --   BUN 31*  --  35*  --   --   --  40*  --   --  32*  --   CREATININE 1.60*  --  1.58*  --   --   --  1.88*  --   --  1.62*  --   CALCIUM  --   --  9.3  --   --   --  9.1  --   --  8.9  --   MG  --   --  2.1  --   --   --   --   --   --   --   --   AST  --   --  204*  --   --   --   --   --   --   --   --   ALT  --   --  190*  --   --   --   --   --   --   --   --   ALKPHOS  --   --  110  --   --   --   --   --   --   --   --   BILITOT  --   --  0.4  --   --   --   --   --   --   --   --   PROT  --   --  6.5  --   --   --   --   --   --   --   --   ALBUMIN  --   --  3.8  --   --   --   --   --   --   --   --   APTT  --   --  77*  --   --   --   --   --   --   --   --   INR  --   --   --   --   --   --  1.08  --   --   --   --   LATICACIDVEN  --   --  2.4*  --   --   --   --   --   --   --   --   TROPONINI  --   < >  9.25*  --  >20.00*  --   --  >20.00*  --   --  15.01*  PHART  --   --   --   --   --  7.330*  --   --  7.493*  --   --   PCO2ART  --   --   --   --   --  38.2  --   --  23.1*  --   --   PO2ART  --   --   --   --   --  128.0*  --   --  131.0*  --   --   < > = values in this interval not displayed.  Recent Labs Lab 03/12/13 1928  GLUCAP 210*    CXR: 5/25 >>>mild edema  ASSESSMENT / PLAN:  PULMONARY A: Acute Respiratory Failure - in setting of cardiogenic shock  P:   -extubated  CARDIOVASCULAR A:  Cardiogenic Shock -  In setting of inferior MI, EF 65% ? Component of hypovolemia STEMI - occluded proximal RCA s/p thombectomy, PTCA with balloon angioplasty of RCA CAD - severe disease noted on LHC to mid LAD Transient pacer - out P:   - Off pressors -per cards   RENAL A:   At Risk ATN - in setting of hypotension / cardiogenic shock, contrast CKD Stage 3  P:   Good UO  GASTROINTESTINAL A:   SUP  P:   -PPI -advance diet  HEMATOLOGIC A:   Anemia   P:  -follow H/H -no evidence of acute bleeding, off integrilin   INFECTIOUS A:   No evidence of acute infectious process.   P:   -monitor fever curve / leukocytosis  ENDOCRINE A:   Hypothyroidism, TSH ok  P:   - synthroid 75 mcg daily (home dose) - add SSI with stress response  NEUROLOGIC A:   Acute Encephalopathy - in setting of NSTEMI, resp fx, resolved  PCCM to sign off , can transfer to SDU & tele in 24h  OK to mobilise with PT    I have personally obtained a history, examined the patient, evaluated laboratory and imaging results, formulated the assessment and plan and placed orders.    Cyril Mourning MD. Tonny Bollman. [place] [facility] Pager [phone] If no response call [phone]    03/15/2013, 9:21 AM

## 2013-03-15 NOTE — Evaluation (Addendum)
Clinical/Bedside Swallow Evaluation Patient Details  Name: Frederick Davis MRN: [id number] Date of Birth: 08-25-1923  Today's Date: 03/15/2013 Time: 0920-0943 SLP Time Calculation (min): 23 min  Past Medical History:  Past Medical History  Diagnosis Date  . Hypothyroidism   . Hypertension   . Rectum cancer 1986  . PC (prostate cancer) 1987  . Renal cell cancer   . Osteoporosis   . Esophageal stricture    Past Surgical History:  Past Surgical History  Procedure Laterality Date  . Kidney surgery  2004  . Radiofrequency ablation kidney  2006  . Hernia repair      2007  . Renal sphincter appliance  2007  . Colonoscopy  01/08/2012    Procedure: COLONOSCOPY;  Surgeon: Malissa Hippo, MD;  Location: [facility];  Service: Endoscopy;  Laterality: N/A;  1030  . Colostomy Left 1986    colon cancer   HPI:  79 y.o. male w/ PMHx significant for hypertension, hperlipidemia, hypothyroidism, mild cardiomegaly and atherosclerotic descending aorta on prior CXR per PCP notes, chronic kidney disease (Stage 3), h/o esophageal stricture, several remote cancers including rectal carcinoma s/p colostomy (1986), prostate cancer (1987), renal cell carcinoma (2004) and skin cancer. He presented to [facility] via EMS on 03/12/2013 with complaints of chest pain which started after eating lunch less than an hour prior to presentation at [facility] ED. Per EMS and ED pt was noted to be pale, diaphoretic and bradycardic with EKG 40 bpm and ST segment elevations indicating inferior-anterior infarction. He presents emergently for cardiac catheterization and temporary pacer insertion.  Found to have Acute inferior STEMI secondary to occluded proximal RCA complicated by cardiogenic shock/bradycardia.  CXR revealed suspected mild worsening of asymmetric pulmonary edema, worse within the right lung.  Grossly unchanged bibasilar opacities, atelectasis versus infiltrate.   Assessment / Plan /  Recommendation Clinical Impression  Pt. with suspected acute reversable dysphagia post extubation yesterday as evidenced by immediate and delayed cough/throat clears that diminished with nectar thick liquids.  Pt. noted to cough prior to po's. Suspect possible edema impacting swallow function. Recommend Dys 2 (conserve energy) and nectar thick liquids.  SLP will follow up tomorrow for possible return to thin and upgrade texture.     Aspiration Risk  Moderate    Diet Recommendation Dysphagia 2 (Fine chop);Nectar-thick liquid   Liquid Administration via: Cup;No straw Medication Administration: Whole meds with puree Supervision: Intermittent supervision to cue for compensatory strategies;Patient able to self feed Compensations: Slow rate;Small sips/bites Postural Changes and/or Swallow Maneuvers: Seated upright 90 degrees;Upright 30-60 min after meal    Other  Recommendations Oral Care Recommendations: Oral care BID   Follow Up Recommendations  None    Frequency and Duration min 2x/week  2 weeks   Pertinent Vitals/Pain none    SLP Swallow Goals Patient will consume recommended diet without observed clinical signs of aspiration with: Modified independent assistance Patient will utilize recommended strategies during swallow to increase swallowing safety with: Modified independent assistance   Swallow Study       Oral/Motor/Sensory Function Overall Oral Motor/Sensory Function: Appears within functional limits for tasks assessed   Ice Chips Ice chips: Not tested   Thin Liquid Thin Liquid: Impaired Presentation: Cup;Straw Pharyngeal  Phase Impairments: Multiple swallows;Throat Clearing - Immediate;Throat Clearing - Delayed    Nectar Thick Nectar Thick Liquid: Impaired Pharyngeal Phase Impairments: Throat Clearing - Delayed (x 1)   Honey Thick Honey Thick Liquid: Not tested   Puree Puree: Within functional limits  Solid   GO    Solid: Within functional limits      Breck Coons [employer].Ed CCC-SLP Pager [phone]  03/15/2013  Royce Macadamia 03/15/2013,9:56 AM

## 2013-03-15 NOTE — Evaluation (Signed)
Physical Therapy Evaluation Patient Details Name: Frederick Davis MRN: [id number] DOB: Aug 18, 1923 Today's Date: 03/15/2013 Time: 7829-5621 PT Time Calculation (min): 22 min  PT Assessment / Plan / Recommendation Clinical Impression  Pt is a very pleasant 79 yo male admitted with cardiac arrest. Pt tolerated OOB mobility for the first time in 4 days very well. Pt with generalized weakness and mild balance impairement. Anticipate pt  to con't to progress to be able to return home with assist of spouse and possibly with RW. Pt very motivated and demo's excellent potential to return home when medically ready.    PT Assessment  Patient needs continued PT services    Follow Up Recommendations  Supervision/Assistance - 24 hour (may need HHPT if supervision level of function not achieved)    Does the patient have the potential to tolerate intense rehabilitation      Barriers to Discharge None      Equipment Recommendations   (may need RW)    Recommendations for Other Services     Frequency Min 3X/week    Precautions / Restrictions Precautions Precautions: Fall Restrictions Weight Bearing Restrictions: No   Pertinent Vitals/Pain Denies pain      Mobility  Bed Mobility Bed Mobility: Supine to Sit;Sitting - Scoot to Edge of Bed Supine to Sit: 4: Min guard;HOB flat Sitting - Scoot to [place] of Bed: 4: Min assist Details for Bed Mobility Assistance: incraesed time, labored effort Transfers Transfers: Sit to Stand;Stand to Sit Sit to Stand: 4: Min assist;With upper extremity assist;From bed Stand to Sit: 4: Min guard;With upper extremity assist;To chair/3-in-1 Details for Transfer Assistance: minA to initiate anterior weight-shift due to low surface height and seat deflate Ambulation/Gait Ambulation/Gait Assistance: 4: Min assist Ambulation Distance (Feet): 150 Feet Assistive device: None Ambulation/Gait Assistance Details: pt with swaying pattern and occasional reaching for rail  in hallways. without RW pt required constant minA via gailt belt to achieve safe amb. Pt would benefit from RW however also anticpate pt to progress well. Gait Pattern: Step-through pattern;Decreased stride length Gait velocity: slow General Gait Details: occasional crossover gait batter, pt with increased RR and mild SOB however SpO2 >95% on room air. Stairs: No    Exercises     PT Diagnosis: Difficulty walking;Generalized weakness  PT Problem List: Decreased strength;Decreased activity tolerance;Decreased balance;Decreased mobility PT Treatment Interventions: DME instruction;Gait training;Stair training;Functional mobility training;Therapeutic activities;Therapeutic exercise   PT Goals Acute Rehab PT Goals PT Goal Formulation: With patient Time For Goal Achievement: 03/22/13 Potential to Achieve Goals: Good Pt will go Supine/Side to Sit: with modified independence;with HOB 0 degrees PT Goal: Supine/Side to Sit - Progress: Goal set today Pt will go Sit to Stand: with modified independence;with upper extremity assist (up to RW) PT Goal: Sit to Stand - Progress: Goal set today Pt will Ambulate: >150 feet;with modified independence;with least restrictive assistive device PT Goal: Ambulate - Progress: Goal set today Pt will Go Up / Down Stairs: 3-5 stairs;with supervision;with rail(s) PT Goal: Up/Down Stairs - Progress: Goal set today  Visit Information  Last PT Received On: 03/15/13 Assistance Needed: +1    Subjective Data  Subjective: Pt received supine in bed agreeable to PT. Patient Stated Goal: home   Prior Functioning  Home Living Lives With: Spouse Available Help at Discharge: Family;Available 24 hours/day Type of Home: House Home Access: Stairs to enter [employer] of Steps: 4 Entrance Stairs-Rails: Left Home Layout: One level Bathroom Shower/Tub: Tub/shower unit;Walk-in shower Bathroom Toilet: Handicapped height Bathroom Accessibility: Yes  How  Accessible: Accessible via walker Home Adaptive Equipment: Walker - rolling;Straight cane Prior Function Level of Independence: Independent Able to Take Stairs?: Yes Driving: Yes Vocation: Retired Musician: No difficulties Dominant Hand: Right    Cognition  Cognition Arousal/Alertness: Awake/alert Behavior During Therapy: WFL for tasks assessed/performed Overall Cognitive Status: Within Functional Limits for tasks assessed    Extremity/Trunk Assessment Right Upper Extremity Assessment RUE ROM/Strength/Tone: WFL for tasks assessed RUE Sensation: WFL - Light Touch Left Upper Extremity Assessment LUE ROM/Strength/Tone: WFL for tasks assessed LUE Sensation: WFL - Light Touch Right Lower Extremity Assessment RLE ROM/Strength/Tone: WFL for tasks assessed RLE Sensation: WFL - Light Touch Left Lower Extremity Assessment LLE ROM/Strength/Tone: WFL for tasks assessed LLE Sensation: WFL - Light Touch Trunk Assessment Trunk Assessment: Kyphotic (forward head)   Balance Balance Balance Assessed: Yes Static Sitting Balance Static Sitting - Balance Support: No upper extremity supported Static Sitting - Level of Assistance: 5: Stand by assistance Static Sitting - Comment/# of Minutes: 5 min Static Standing Balance Static Standing - Balance Support: No upper extremity supported;During functional activity (urinating at commode) Static Standing - Level of Assistance: 5: Stand by assistance Static Standing - Comment/# of Minutes: 2 min  End of Session PT - End of Session Equipment Utilized During Treatment: Gait belt Activity Tolerance: Patient tolerated treatment well Patient left: in chair;with call bell/phone within reach Nurse Communication: Mobility status  GP     Marcene Brawn 03/15/2013, 12:18 PM  Agree with above assessment  Lewis Shock, PT, DPT Pager #: [phone] Office #: [phone]

## 2013-03-15 NOTE — Progress Notes (Signed)
79 yo male with PMHx significant for hypertension, diet controlled hyperlipidemia, hypothyroidism, numerous cancers but no known coronary artery disease admitted with cardiogenic shock secondary to inferior STEMI now s/p cardiac catheterization with PCI of RCA with balloon angioplasty only, thrombectomy of RCA and Angioseal RFA  SUBJECTIVE: Aggressive pulmonary toilet initiated yesterday, Feels short of breath this morning, incentive spirometry performed multiple times, started on 3L [place] O2 with improvement in symptoms. Good UOP w/o Lasix for net negative ~947ml on KVO IVF. Off dopamine. Bp this am 124/71 mmHg pulse 69 bpm. Troponin trending down. Leukocytosis improving.   PHYSICAL EXAM Filed Vitals:   03/15/13 0300 03/15/13 0400 03/15/13 0500 03/15/13 0600  BP: 136/72 148/84  124/71  Pulse: 73 68  69  Temp:  98.6 F (37 C)    TempSrc:  Oral    Resp: 10 21  14   Height:      Weight:   193 lb 2 oz (87.6 kg)   SpO2: 97% 100%  100%   Physical Exam  General: Well developed, well nourished, pleasant, elderly White male, alert and oriented,NAD HEENT: Normocephalic, atraumatic , still has the NG tube in place Neck: no JVD appreciated Lungs: scattered rhonchi bilaterally , some clearing with cough Heart: RRR no M/R/G appreciated Abdomen/GU: colostomy bag intact, Foley in place with clear yellow urine Extremities: warm, no edema, SCDs in place  Neuro: moves all extremities spontaneously.  Psych: Responds to questions appropriately with a normal affect.   LABS: Lab Results  Component Value Date   TROPONINI 15.01* 03/14/2013   Results for orders placed during the hospital encounter of 03/12/13 (from the past 24 hour(s))  TROPONIN I     Status: Abnormal   Collection Time    03/14/13 12:19 PM      Result Value Range   Troponin I 15.01 (*) <0.30 ng/mL    Intake/Output Summary (Last 24 hours) at 03/15/13 0700 Last data filed at 03/15/13 0100  Gross per 24 hour  Intake  453.2 ml  Output    1425 ml  Net -971.8 ml    EKG:  No acute ST T wave changes.  NSR.  Rate 77.  03/15/2013   03/12/2013 [facility] Hemodynamic Findings:  Central aortic pressure: 47/30 (beginning of case), 100/50 (closing)  Left ventricular pressure: 100/8/13  Angiographic Findings:  Left main: No obstructive disease.  Left Anterior Descending Artery: Large caliber vessel that courses to the apex. 90% mid stenosis.  Circumflex Artery: Moderate caliber vessel with early intermediate branch, patent with mild plaque.  Right Coronary Artery: 100% proximal occlusion. At the conclusion of the case, this was a large dominant vessel.  Left Ventricular Angiogram: LVEF=65%.   Impression:  1. Acute inferior STEMI secondary to occluded proximal RCA complicated by cardiogenic shock/bradycardia.  2. Successful thrombectomy, PTCA with balloon angioplasty only of the RCA  3. Severe disease in the mid LAD  4. Preserved LV systolic function  ASSESSMENT AND PLAN: HD# 4 for 79 yo male admitted with cardiogenic shock secondary to inferior STEMI.  # Acute MI, inferior wall, initial episode of care:  Status post emergent PCI stent.  Temp wire pulled. Off pressors. S/p Integrilin and Angiomax. On Statin (atorvastatin 80 mg qd), ASA (81 mg qd)  and Brilinta (90 mg bid). Beta-blockers held secondary to hypotension. Bp this am 124/71 mmHg, pulse 69 bpm. -consider starting low-dose metoprolol as bp permits    Recent Labs Lab 03/12/13 1900 03/12/13 2301 03/13/13 0501 03/14/13 1219  TROPONINI 9.25* >20.00* >20.00* 15.01*   #  Cardiogenic Shock: secondary to acute MI and bradycardia   # Respiratory failure:  Secondary to cardiogenic shock, now extubated  # Hypertension: home regimen of amlodipine held, BB deferred in setting of hypotension now improved -consider starting low-dose beta-blocker tomorrow ie carvedilol 3.125 mg bid   Filed Vitals:   03/15/13 0300 03/15/13 0400 03/15/13 0500 03/15/13 0600  BP: 136/72 148/84  124/71   Pulse: 73 68  69  Temp:  98.6 F (37 C)    TempSrc:  Oral    Resp: 10 21  14   Height:      Weight:   193 lb 2 oz (87.6 kg)   SpO2: 97% 100%  100%    # Chronic Kidney Disease, Stage 3: in setting of previous Renal Cell Carcinoma, baseline creatine 1.5-1.6 (last GFR 37 mL/min), creatine now at baseline  Recent Labs Lab 03/12/13 1458 03/12/13 1900 03/13/13 0500 03/14/13 0500  CREATININE 1.60* 1.58* 1.88* 1.62*   # Hypothyroidism: stable on levothyroxine   # Hyperlipidemia: previously diet controlled, high dose statin initiated  # H/o Rectal Cancer: s/p colostomy, last colonoscopy 01/08/2012 demonstrating few diverticula and 2 small cecal polyps  # H/O prostate cancer and impotence: s/p penile prosthesis -pull Foley and asees for incontinence, consult Urology if indicated  # H/o Prostate cancer: s/p prostatectomy  # Nutrition: d/c NG tube, advance diet  # disposition: transfer to SDU  SCHOOLER, KAREN 03/15/2013 7:00 AM  Attending Note:   The patient was seen and examined.  Agree with assessment and plan as noted above.  Changes made to the above note as needed.  Will DC NG today and start regular diet.  Discussed with Maryelizabeth Kaufmann - she will watch to make sure he does not have difficulty swallowing.   Transfer to step down.     Vesta Mixer, Montez Hageman., MD, [facility] 03/15/2013, 8:47 AM

## 2013-03-15 NOTE — Progress Notes (Signed)
Pt called nurse to report he felt he wasn't getting "good breaths." Incentive Spirometer done with pt x 10. Pt reached 1700 all 10x. Pt continued to feel he wasn't getting a good breath in, O2 [place] restarted on pt at 3L. Pt now states "feels much better, I can actually breath now." will continue to monitor and wean

## 2013-03-15 NOTE — Progress Notes (Signed)
Pt "unsure" re  taking lipitor . Med literature printed out and given to Pt and spouse . Med re-timed for 2100 tonoc .

## 2013-03-16 DIAGNOSIS — I2119 ST elevation (STEMI) myocardial infarction involving other coronary artery of inferior wall: Secondary | ICD-10-CM | POA: Diagnosis not present

## 2013-03-16 LAB — URINALYSIS, ROUTINE W REFLEX MICROSCOPIC
Glucose, UA: NEGATIVE mg/dL
Ketones, ur: NEGATIVE mg/dL
Nitrite: NEGATIVE
Protein, ur: NEGATIVE mg/dL
Urobilinogen, UA: 0.2 mg/dL (ref 0.0–1.0)

## 2013-03-16 LAB — URINE MICROSCOPIC-ADD ON

## 2013-03-16 MED ORDER — PHENAZOPYRIDINE HCL 100 MG PO TABS
100.0000 mg | ORAL_TABLET | Freq: Three times a day (TID) | ORAL | Status: DC
  Administered 2013-03-16 – 2013-03-18 (×7): 100 mg via ORAL
  Filled 2013-03-16 (×11): qty 1

## 2013-03-16 MED ORDER — CIPROFLOXACIN HCL 250 MG PO TABS
250.0000 mg | ORAL_TABLET | Freq: Two times a day (BID) | ORAL | Status: DC
  Administered 2013-03-16 – 2013-03-18 (×5): 250 mg via ORAL
  Filled 2013-03-16 (×7): qty 1

## 2013-03-16 MED ORDER — ENSURE PUDDING PO PUDG
1.0000 | ORAL | Status: DC
  Administered 2013-03-16 – 2013-03-17 (×2): 1 via ORAL

## 2013-03-16 MED ORDER — LORAZEPAM 0.5 MG PO TABS
0.5000 mg | ORAL_TABLET | Freq: Once | ORAL | Status: AC
  Administered 2013-03-16: 0.5 mg via ORAL
  Filled 2013-03-16: qty 1

## 2013-03-16 MED FILL — Sodium Chloride IV Soln 0.9%: INTRAVENOUS | Qty: 50 | Status: AC

## 2013-03-16 NOTE — Progress Notes (Signed)
Physical Therapy Treatment Patient Details Name: Frederick Davis MRN: [id number] DOB: 11-29-1922 Today's Date: 03/16/2013 Time: 4098-1191 PT Time Calculation (min): 17 min  PT Assessment / Plan / Recommendation Comments on Treatment Session  Pt progressing very well. Pt to benefit from use of RW to improve safety and stabiltiy with ambulation. Pt safe to d/c home when medically stable with use of RW and assist from spouse. PT to practice stairs 5/28.    Follow Up Recommendations  Supervision/Assistance - 24 hour     Does the patient have the potential to tolerate intense rehabilitation     Barriers to Discharge        Equipment Recommendations  None recommended by PT (pt with RW avail at home)    Recommendations for Other Services    Frequency Min 3X/week   Plan Discharge plan remains appropriate;Frequency remains appropriate    Precautions / Restrictions Precautions Precautions: Fall Restrictions Weight Bearing Restrictions: No   Pertinent Vitals/Pain Denies pain, HR to 106 during amb    Mobility  Bed Mobility Bed Mobility: Not assessed Transfers Transfers: Sit to Stand;Stand to Sit Sit to Stand: 5: Supervision;With upper extremity assist;From chair/3-in-1 (increased time for low surface height) Stand to Sit: 6: Modified independent (Device/Increase time);With upper extremity assist;To chair/3-in-1 Ambulation/Gait Ambulation/Gait Assistance: 5: Supervision;4: Min guard Ambulation Distance (Feet): 300 Feet Assistive device: Rolling walker;None Ambulation/Gait Assistance Details: initially amb without RW, pt with noted instabily, pt agrees. Trialed RW pt reports he felt more steady and walking was less labored. pt to benefit from RW for longer distance ambulation. pt at decreased falls risk with RW. minimal v/c's for walker management Gait Pattern: Step-through pattern;Decreased stride length Gait velocity: improved with RW General Gait Details: pt with staggering steps  without RW Stairs: No    Exercises     PT Diagnosis:    PT Problem List:   PT Treatment Interventions:     PT Goals Acute Rehab PT Goals PT Goal: Sit to Stand - Progress: Progressing toward goal PT Goal: Ambulate - Progress: Progressing toward goal  Visit Information  Last PT Received On: 03/16/13 Assistance Needed: +1    Subjective Data  Subjective: Pt received standing at bedside with RN tech.   Cognition  Cognition Arousal/Alertness: Awake/alert Behavior During Therapy: WFL for tasks assessed/performed Overall Cognitive Status: Within Functional Limits for tasks assessed    Balance     End of Session PT - End of Session Equipment Utilized During Treatment: Gait belt Activity Tolerance: Patient tolerated treatment well Patient left: in chair;with call bell/phone within reach;with family/visitor present Nurse Communication: Mobility status (ambulate with patient 2-3x/day in prep for d/c)   GP     Marcene Brawn 03/16/2013, 9:19 AM  Lewis Shock, PT, DPT Pager #: [phone] Office #: [phone]

## 2013-03-16 NOTE — Progress Notes (Signed)
   SUBJECTIVE:  Feels stronger.  No acute pain or dyspnea.   PHYSICAL EXAM Filed Vitals:   03/15/13 1938 03/15/13 2338 03/16/13 0020 03/16/13 0400  BP: 143/74 136/103 150/86 167/85  Pulse: 73 73 75 85  Temp: 98.2 F (36.8 C) 98.7 F (37.1 C)  98.4 F (36.9 C)  TempSrc: Oral Oral  Oral  Resp: 24 17 18 23   Height:      Weight:      SpO2: 97% 96% 97% 100%   General:  No distress Lungs:  Few basilar crackles Heart:  RRR, no rub Abdomen:  Positive bowel sounds, no rebound no guarding Extremities:  No edema  LABS:  Results for orders placed during the hospital encounter of 03/12/13 (from the past 24 hour(s))  URINALYSIS, ROUTINE W REFLEX MICROSCOPIC     Status: Abnormal   Collection Time    03/16/13  4:43 AM      Result Value Range   Color, Urine YELLOW  YELLOW   APPearance CLOUDY (*) CLEAR   Specific Gravity, Urine 1.010  1.005 - 1.030   pH 8.0  5.0 - 8.0   Glucose, UA NEGATIVE  NEGATIVE mg/dL   Hgb urine dipstick MODERATE (*) NEGATIVE   Bilirubin Urine NEGATIVE  NEGATIVE   Ketones, ur NEGATIVE  NEGATIVE mg/dL   Protein, ur NEGATIVE  NEGATIVE mg/dL   Urobilinogen, UA 0.2  0.0 - 1.0 mg/dL   Nitrite NEGATIVE  NEGATIVE   Leukocytes, UA LARGE (*) NEGATIVE  URINE MICROSCOPIC-ADD ON     Status: Abnormal   Collection Time    03/16/13  4:43 AM      Result Value Range   WBC, UA TOO NUMEROUS TO COUNT  <3 WBC/hpf   RBC / HPF 7-10  <3 RBC/hpf   Bacteria, UA MANY (*) RARE    Intake/Output Summary (Last 24 hours) at 03/16/13 0732 Last data filed at 03/16/13 0600  Gross per 24 hour  Intake    840 ml  Output   3250 ml  Net  -2410 ml    ASSESSMENT AND PLAN:  UTI:  Start Cipro  CAD:  PCI/STENT RCA.  He has residual proximal LAD disease.  I reviewed the films today.  I will consider out patient stress imaging after he has recovered from this event.   Continue ASA and Brilinta.  Ambulated today.  Possibly home in the AM if ambulating.    03/16/2013 7:32 AM

## 2013-03-16 NOTE — Progress Notes (Signed)
Pt c/o of pain and discomfort with urination. Theodore Demark PA paged.

## 2013-03-16 NOTE — Progress Notes (Signed)
Pt just returned from walking with wife. Looked fairly steady. Began ed with wife present. Will f/u in am with more education. Good reception. [id number] Ethelda Chick CES, ACSM 1:37 PM 03/16/2013

## 2013-03-16 NOTE — Progress Notes (Signed)
Utilization Review Completed.Verenice Westrich T5/27/2014  

## 2013-03-16 NOTE — Progress Notes (Signed)
Transferred to 2030 by wheelchair, stable. Report given to RN. Belongings  with wife.

## 2013-03-16 NOTE — Progress Notes (Addendum)
Speech Language Pathology Dysphagia Treatment Patient Details Name: Frederick Davis MRN: [id number] DOB: 05-Jan-1923 Today's Date: 03/16/2013 Time: 1205-1220 SLP Time Calculation (min): 15 min  Assessment / Plan / Recommendation Clinical Impression  Follow up with pt. today to determine ability to upgrade diet texture and liquids at bedside.  SLP arrived during lunch meal and coughing intermittently.  He and wife were uncertain if coughing has increased while eating/drinking.  Pt. trialed with thin water via cup followed by mildly delayed cough indicative of decreased airway protection.  Oral prep and mastication appeared prolonged however, pt. unaware when questioned.  Recommend continue Dys 2 texture and nectar thick and recommend MBS to further evaluate swallow function due to apparent mild decline since yesterday's initial assessment.  MD, please write order for MBS if agree and can be completed 5/28 in a.m.    Diet Recommendation  Continue with Current Diet: Dysphagia 2 (fine chop);Nectar-thick liquid    SLP Plan MBS   Pertinent Vitals/Pain none   Swallowing Goals  SLP Swallowing Goals Patient will consume recommended diet without observed clinical signs of aspiration with: Modified independent assistance Swallow Study Goal #1 - Progress: Progressing toward goal Patient will utilize recommended strategies during swallow to increase swallowing safety with: Modified independent assistance Swallow Study Goal #2 - Progress: Progressing toward goal  General Temperature Spikes Noted: No Respiratory Status: Room air Behavior/Cognition: Alert;Cooperative;Pleasant mood Oral Cavity - Dentition: Adequate natural dentition (partials) Patient Positioning: Upright in bed  Oral Cavity - Oral Hygiene Does patient have any of the following "at risk" factors?: Diet - patient on thickened liquids;Other - dysphagia Brush patient's teeth BID with toothbrush (using toothpaste with fluoride):  Yes Patient is AT RISK - Oral Care Protocol followed (see row info): Yes   Dysphagia Treatment Treatment focused on: Skilled observation of diet tolerance;Upgraded PO texture trials Treatment Methods/Modalities: Skilled observation;Differential diagnosis Patient observed directly with PO's: Yes Type of PO's observed: Thin liquids;Dysphagia 2 (chopped);Nectar-thick liquids Feeding: Able to feed self Liquids provided via: Cup;No straw Oral Phase Signs & Symptoms: Prolonged bolus formation;Prolonged oral phase;Prolonged mastication Pharyngeal Phase Signs & Symptoms: Immediate cough;Delayed throat clear Type of cueing: Verbal Amount of cueing: Minimal   GO     Royce Macadamia M.Ed [employer] [phone]  03/16/2013

## 2013-03-16 NOTE — Progress Notes (Addendum)
Pt c/o " constant bladder pressure". Pt voided 100 ml of cloudy yellow urine. Bladder scan done post void and resulted 40 ml.  Pt denies pain with urination at this time. Notified card fellow on call. Urine sent for UA.

## 2013-03-16 NOTE — Progress Notes (Signed)
Pt with difficulty urinating s/p dc Frederick Davis yesterday.  Bladder scan shows minimal urine.  Will send UA/cx.

## 2013-03-16 NOTE — Progress Notes (Signed)
NUTRITION FOLLOW UP  Intervention:   1. Ensure Pudding po BID, each supplement provides 170 kcal and 4 grams of protein.    Nutrition Dx:   Inadequate oral intake related to inability to eat as evidenced by NPO status. Resolving   Goal:   EN goal no longer applicable  New Goal: PO intake to meet >/=9)% estimated nutrition needs.  Monitor:   Po intake, weight trends, labs, I/O's  Assessment:   Pt admitted with STEMI, and cardiogenic shock. S/p cardiac catheterization with PCI of RCA with balloon angioplasty only, thrombectomy of RCA and Angioseal RFA Pt extubated on 5/25. OG tube out with extubation. Renal function at baseline.  Seen by SLP for diet, recommending D2 with nectar thick liquids.  Pt states appetite is "a little less then fair" and c/o some pain/irritation from being intubated. Willing to try Ensure Pudding.   Height: Ht Readings from Last 1 Encounters:  03/12/13 5\' 6"  (1.676 m)    Weight Status:   Wt Readings from Last 1 Encounters:  03/15/13 193 lb 2 oz (87.6 kg)    Re-estimated needs:  Kcal: 1800-2000 Protein: 60-70 gm  Fluid: 1.8-2 L   Skin: intact   Diet Order: Dysphagia 2, nectar thick liquids    Intake/Output Summary (Last 24 hours) at 03/16/13 1420 Last data filed at 03/16/13 0900  Gross per 24 hour  Intake    500 ml  Output   2025 ml  Net  -1525 ml    Last BM: none documented, pt with colostomy    Labs:   Recent Labs Lab 03/12/13 1458 03/12/13 1900 03/13/13 0500 03/14/13 0500  NA 143 138 137 138  K 4.2 4.4 4.7 3.7  CL 113* 104 107 110  CO2  --  18* 16* 17*  BUN 31* 35* 40* 32*  CREATININE 1.60* 1.58* 1.88* 1.62*  CALCIUM  --  9.3 9.1 8.9  MG  --  2.1  --   --   GLUCOSE 137* 253* 150* 92    CBG (last 3)  No results found for this basename: GLUCAP,  in the last 72 hours  Scheduled Meds: . aspirin  81 mg Per NG tube Daily  . atorvastatin  80 mg Per NG tube q1800  . ciprofloxacin  250 mg Oral BID  . levothyroxine  75 mcg  Oral QAC breakfast  . pantoprazole  40 mg Oral Daily  . phenazopyridine  100 mg Oral TID WC  . Ticagrelor  90 mg Oral BID    Continuous Infusions: . sodium chloride Stopped (03/14/13 2345)    Clarene Duke RD, LDN Pager [phone] After Hours pager [phone]

## 2013-03-17 ENCOUNTER — Inpatient Hospital Stay (HOSPITAL_COMMUNITY): Payer: Medicare Other

## 2013-03-17 DIAGNOSIS — R570 Cardiogenic shock: Secondary | ICD-10-CM | POA: Diagnosis not present

## 2013-03-17 DIAGNOSIS — I2119 ST elevation (STEMI) myocardial infarction involving other coronary artery of inferior wall: Secondary | ICD-10-CM | POA: Diagnosis not present

## 2013-03-17 LAB — URINE CULTURE: Colony Count: >=100000

## 2013-03-17 MED ORDER — CLOPIDOGREL BISULFATE 75 MG PO TABS
75.0000 mg | ORAL_TABLET | Freq: Every day | ORAL | Status: DC
  Administered 2013-03-18: 75 mg via ORAL
  Filled 2013-03-17 (×2): qty 1

## 2013-03-17 MED ORDER — LORAZEPAM 0.5 MG PO TABS
0.5000 mg | ORAL_TABLET | Freq: Once | ORAL | Status: AC
  Administered 2013-03-17: 0.5 mg via ORAL
  Filled 2013-03-17: qty 1

## 2013-03-17 NOTE — Progress Notes (Signed)
Patient expresses that he feels short of breath and that his respirations and work of breathing increase after he receives his Brilinta at night.  He said he experienced this feeling last night on 2900 and that the nurse gave him ativan and put him on oxygen and he slept well.  I notified Dr. Mayford Knife for a one time dose of Ativan 0.5mg  po and administered it and put him on 2L  this evening. He is resting well. Will continue to monitor.

## 2013-03-17 NOTE — Progress Notes (Signed)
CARDIAC REHAB PHASE I   PRE:  Rate/Rhythm: 79 SR with PVC    BP: sitting 98/66    SaO2:   MODE:  Ambulation: 550 ft   POST:  Rate/Rhythm: 99 with PVCs    BP: sitting 130/80     SaO2:   Tolerated well. Has bad knee with slight limp. Pt stated he felt winded toward end of walk. Pt with seemingly increased PVCs today.  To recliner. Ed completed and wife present. Interested in [facility] and will send referral to [place] [facility].  [id number]  Harriet Masson CES, ACSM 03/17/2013 2:26 PM

## 2013-03-17 NOTE — Progress Notes (Signed)
Speech Language Pathology  Patient Details Name: Frederick Davis MRN: [id number] DOB: [birth year] Today's Date: 03/17/2013 Time:  -    Full documentation will be completed on pt.'s MBS today.  Laryngeal penetration radiologist confirmed small Zenker's diverticulum (MD did not dictate) Recommend: regular diet texture and thin liquid with SMALL sips, avoid straws.    03/17/2013, 11:18 AM

## 2013-03-17 NOTE — Progress Notes (Signed)
Notified that patient had seven beats of VTach.  Went to assess patient and he was walking back from the restroom and alert. Will continue to monitor.

## 2013-03-17 NOTE — Progress Notes (Signed)
Patient and his wife ambulated around the unit.  Patient had a slow, steady gait. Patient's HR remained stable and no notificatons received about VTach. Will continue to monitor.

## 2013-03-17 NOTE — Procedures (Signed)
Objective Swallowing Evaluation: Modified Barium Swallowing Study  Patient Details  Name: Frederick Davis MRN: [id number] Date of Birth: April 29, 1923  Today's Date: 03/17/2013 Time: 0950-1020 SLP Time Calculation (min): 30 min  Past Medical History:  Past Medical History  Diagnosis Date  . Hypothyroidism   . Hypertension   . Rectum cancer 1986  . PC (prostate cancer) 1987  . Renal cell cancer   . Osteoporosis   . Esophageal stricture    Past Surgical History:  Past Surgical History  Procedure Laterality Date  . Kidney surgery  2004  . Radiofrequency ablation kidney  2006  . Hernia repair      2007  . Renal sphincter appliance  2007  . Colonoscopy  01/08/2012    Procedure: COLONOSCOPY;  Surgeon: Malissa Hippo, MD;  Location: [facility];  Service: Endoscopy;  Laterality: N/A;  1030  . Colostomy Left 1986    colon cancer   HPI:  79 y.o. male w/ PMHx significant for hypertension, hperlipidemia, hypothyroidism, mild cardiomegaly and atherosclerotic descending aorta on prior CXR per PCP notes, chronic kidney disease (Stage 3), h/o esophageal stricture, several remote cancers including rectal carcinoma s/p colostomy (1986), prostate cancer (1987), renal cell carcinoma (2004) and skin cancer. He presented to [facility] via EMS on 03/12/2013 with complaints of chest pain which started after eating lunch less than an hour prior to presentation at [facility] ED. Per EMS and ED pt was noted to be pale, diaphoretic and bradycardic with EKG 40 bpm and ST segment elevations indicating inferior-anterior infarction. He presents emergently for cardiac catheterization and temporary pacer insertion.  Found to have Acute inferior STEMI secondary to occluded proximal RCA complicated by cardiogenic shock/bradycardia.  CXR revealed suspected mild worsening of asymmetric pulmonary edema, worse within the right lung.  Grossly unchanged bibasilar opacities, atelectasis versus infiltrate.  Pt. seen  5/27 with continued s/s aspiration with MBS recommended for today.     Assessment / Plan / Recommendation Clinical Impression  Dysphagia Diagnosis: Mild pharyngeal phase dysphagia;Suspected primary esophageal dysphagia Clinical impression: Pt. exhibited suspected esophageal dysphagia that contributed to pharyngeal impairments.  Esophagus was briefly scanned revealing possible abnormalities, therefore radiologist called to room.  He stated pt. appears to have a small Zenker's diverticulum and confirmed a large hiatal hernia (MD did not dictate a report).  Tongue base retraction was decreased leading to maximum vallecular residual after puree.  Thin and nectar consistencies were both penetrated without a significant between the two in regards to amount or depth in laryngeal vestibule.  A chin tuck posture was ineffictive in preventing penetration.  Oral prep, mastication and transit with cracker was functional.  SLP recommends diet texture upgrade to regular and thin liquids.  SLP discussed the importance of consuming small cup sips, multiple swallows, throat clear/cough after every 2-3 sips and general esophageal precautions.  Pt. reports having esophageal dilation 20 yrs ago.  Advised him to discuss with MD if esophageal difficutlies worsen.  SLP will follow while in hospital.       Treatment Recommendation  Therapy as outlined in treatment plan below    Diet Recommendation Regular;Dysphagia 1 (Puree)   Liquid Administration via: Cup;No straw Medication Administration: Whole meds with puree Supervision: Intermittent supervision to cue for compensatory strategies;Patient able to self feed Compensations: Slow rate;Small sips/bites;Multiple dry swallows after each bite/sip;Clear throat intermittently Postural Changes and/or Swallow Maneuvers: Seated upright 90 degrees;Upright 30-60 min after meal    Other  Recommendations Oral Care Recommendations: Oral care  BID   Follow Up Recommendations  None     Frequency and Duration min 2x/week  2 weeks   Pertinent Vitals/Pain none    SLP Swallow Goals Patient will consume recommended diet without observed clinical signs of aspiration with: Modified independent assistance Swallow Study Goal #1 - Progress: Progressing toward goal Patient will utilize recommended strategies during swallow to increase swallowing safety with: Modified independent assistance Swallow Study Goal #2 - Progress: Progressing toward goal      Reason for Referral Objectively evaluate swallowing function   Oral Phase Oral Preparation/Oral Phase Oral Phase: WFL   Pharyngeal Phase Pharyngeal Phase Pharyngeal Phase: Impaired Pharyngeal - Thin Pharyngeal - Thin Teaspoon: Penetration/Aspiration during swallow;Reduced laryngeal elevation;Reduced airway/laryngeal closure;Reduced tongue base retraction;Pharyngeal residue - valleculae (min) Penetration/Aspiration details (thin teaspoon): Material enters airway, remains ABOVE vocal cords then ejected out Pharyngeal - Thin Cup: Penetration/Aspiration during swallow;Reduced laryngeal elevation;Reduced airway/laryngeal closure;Reduced tongue base retraction;Pharyngeal residue - valleculae Penetration/Aspiration details (thin cup): Material enters airway, remains ABOVE vocal cords and not ejected out;Material enters airway, remains ABOVE vocal cords then ejected out Pharyngeal - Thin Straw: Penetration/Aspiration during swallow;Reduced laryngeal elevation;Reduced airway/laryngeal closure;Reduced tongue base retraction;Pharyngeal residue - valleculae Penetration/Aspiration details (thin straw): Material enters airway, remains ABOVE vocal cords and not ejected out Pharyngeal - Solids Pharyngeal - Puree: Pharyngeal residue - valleculae;Reduced tongue base retraction Pharyngeal - Regular: Pharyngeal residue - valleculae;Reduced tongue base retraction  Cervical Esophageal Phase    GO    Cervical Esophageal Phase Cervical  Esophageal Phase: Impaired (Radiologist confirmed small Zenker's diverticulum)         Frederick Davis Frederick Davis M.Ed [employer] [phone]  03/17/2013

## 2013-03-17 NOTE — Progress Notes (Signed)
   SUBJECTIVE:  Feels stronger.  He was going for a swallowing study today to see if we can advance his diet. Of note he did have some dyspnea after taking Brlinita last night.   PHYSICAL EXAM Filed Vitals:   03/16/13 0745 03/16/13 1330 03/16/13 2041 03/17/13 0421  BP: 153/96 136/83 134/79 132/95  Pulse: 96 75 79 83  Temp: 98.4 F (36.9 C) 98 F (36.7 C) 98.5 F (36.9 C) 98.1 F (36.7 C)  TempSrc: Oral Oral Oral Oral  Resp: 18 18 20 20   Height:      Weight:    181 lb 7 oz (82.3 kg)  SpO2: 94% 97% 97% 97%   General:  No distress Lungs:  Few basilar crackles Heart:  RRR, no rub Abdomen:  Positive bowel sounds, no rebound no guarding Extremities:  No edema  LABS:  No results found for this or any previous visit (from the past 24 hour(s)).  Intake/Output Summary (Last 24 hours) at 03/17/13 0836 Last data filed at 03/17/13 0730  Gross per 24 hour  Intake    920 ml  Output   1400 ml  Net   -480 ml    ASSESSMENT AND PLAN:  UTI:  Started Cipro day 2/7  CAD:  PCI/STENT RCA.  He has residual proximal LAD disease.  I reviewed the films today.  I will consider out patient stress imaging after he has recovered from this event.  I discussed this at length with the patient and his wife today.   Continue ASA.  However, given the dyspnea with Brlinita I will switch this to Plavix.   03/17/2013 8:36 AM

## 2013-03-18 ENCOUNTER — Other Ambulatory Visit: Payer: Self-pay | Admitting: Physician Assistant

## 2013-03-18 ENCOUNTER — Encounter (HOSPITAL_COMMUNITY): Payer: Self-pay | Admitting: Physician Assistant

## 2013-03-18 DIAGNOSIS — I959 Hypotension, unspecified: Secondary | ICD-10-CM | POA: Diagnosis present

## 2013-03-18 DIAGNOSIS — I2119 ST elevation (STEMI) myocardial infarction involving other coronary artery of inferior wall: Secondary | ICD-10-CM | POA: Diagnosis not present

## 2013-03-18 DIAGNOSIS — I442 Atrioventricular block, complete: Secondary | ICD-10-CM | POA: Diagnosis present

## 2013-03-18 DIAGNOSIS — D649 Anemia, unspecified: Secondary | ICD-10-CM | POA: Diagnosis present

## 2013-03-18 DIAGNOSIS — I200 Unstable angina: Secondary | ICD-10-CM

## 2013-03-18 LAB — BASIC METABOLIC PANEL
Calcium: 9.4 mg/dL (ref 8.4–10.5)
GFR calc Af Amer: 41 mL/min — ABNORMAL LOW (ref >90)
GFR calc non Af Amer: 35 mL/min — ABNORMAL LOW (ref >90)
Potassium: 3.8 mEq/L (ref 3.5–5.1)
Sodium: 140 mEq/L (ref 135–145)

## 2013-03-18 LAB — MAGNESIUM: Magnesium: 1.8 mg/dL (ref 1.5–2.5)

## 2013-03-18 MED ORDER — ATORVASTATIN CALCIUM 80 MG PO TABS: 80.0000 mg | ORAL_TABLET | Freq: Every day | ORAL | Status: DC

## 2013-03-18 MED ORDER — CIPROFLOXACIN HCL 250 MG PO TABS: 250.0000 mg | ORAL_TABLET | Freq: Two times a day (BID) | ORAL | Status: DC

## 2013-03-18 MED ORDER — ASPIRIN 81 MG PO TABS: 81.0000 mg | ORAL_TABLET | Freq: Every day | ORAL | Status: DC

## 2013-03-18 MED ORDER — NITROGLYCERIN 0.4 MG SL SUBL: 0.4000 mg | SUBLINGUAL_TABLET | SUBLINGUAL | Status: DC | PRN

## 2013-03-18 MED ORDER — CLOPIDOGREL BISULFATE 75 MG PO TABS: 75.0000 mg | ORAL_TABLET | Freq: Every day | ORAL | Status: DC

## 2013-03-18 MED ORDER — ENSURE PUDDING PO PUDG: 1.0000 | ORAL | Status: DC

## 2013-03-18 NOTE — Care Management Note (Signed)
    Page 1 of 1   03/18/2013     4:07:27 PM   CARE MANAGEMENT NOTE 03/18/2013  Patient:  Frederick Davis, Frederick Davis   Account Number:  [id number]  Date Initiated:  03/16/2013  Documentation initiated by:  Ardella Chhim  Subjective/Objective Assessment:   PT ADM WITH STEMI, CP, S/P PCI.  PTA, PT INDEPENDENT, LIVES WITH SPOUSE.     Action/Plan:   WILL FOLLOW FOR HOME NEEDS AS PT PROGRESSES.   Anticipated DC Date:  03/18/2013   Anticipated DC Plan:  HOME/SELF CARE      DC Planning Services  CM consult      Choice offered to / List presented to:             Status of service:  Completed, signed off Medicare Important Message given?   (If response is "NO", the following Medicare IM given date fields will be blank) Date Medicare IM given:   Date Additional Medicare IM given:    Discharge Disposition:  HOME/SELF CARE  Per UR Regulation:    If discussed at Long Length of Stay Meetings, dates discussed:    Comments:  03/17/13 Rosalita Chessman [phone] Checked on copay information:  this is a tier 2 medication, no auth required, co-pay at retail pharmacy will have to pay a 30% co-insurance, approx $214 for 90 days and  approx $77 for 30 days  03/16/13 Murle Hellstrom,RN,BSN [phone] PT GIVEN 30 DAY FREE TRIAL CARD FOR BRILINTA.  AWAITING COPAY INFO.

## 2013-03-18 NOTE — Progress Notes (Signed)
Chaplain Note:  Chaplain visited with pt and pt's wife.  Pt was sitting in bed, awake, and alert.  Pt's wife was at bedside.  Chaplain provided spiritual comfort, support, and prayer for pt and pt's wife.  Both expressed appreciation for chaplain support.  Pt will be discharged today.  No follow up required.  03/18/13 1000  Clinical Encounter Type  Visited With Patient and family together  Visit Type Spiritual support  Referral From Family  Spiritual Encounters  Spiritual Needs Prayer;Emotional  Stress Factors  Patient Stress Factors Health changes;Major life changes  Family Stress Factors Major life changes  Verdie Shire, [place] [phone]

## 2013-03-18 NOTE — Discharge Summary (Signed)
CARDIOLOGY DISCHARGE SUMMARY   Patient ID: Frederick Davis MRN: [id number] DOB/AGE: 1923-02-13 79 y.o.  Admit date: 03/12/2013 Discharge date: 03/18/2013  Primary Discharge Diagnosis:    Acute MI, inferior wall, initial episode of care Secondary Discharge Diagnosis:    Essential hypertension, benign   Colostomy in place   CKD (chronic kidney disease) stage 3, GFR 30-59 ml/min   Hiatal hernia   ST elevation myocardial infarction (STEMI) of inferior wall   Coronary atherosclerosis of native coronary artery   Cardiogenic shock   Acute respiratory failure   Altered mental status   Complete heart block   Hypotension   Anemia   UTI   Dysphagia  Consults: Anesthesia, critical care medicine, nutritional management, speech therapy, spiritual care, physical therapy, cardiac rehabilitation  Procedure Performed:  1. Left Heart Catheterization 2. Selective Coronary Angiography 3. Left ventricular angiogram 4. Thrombectomy RCA 5. PCI of the RCA with balloon angioplasty only 6. Angioseal RFA 7. Endotracheal intubation 8. Swallowing function study 9. Insertion of arterial catheter 10. Insertion of central venous catheter  Hospital Course: Frederick Davis is a 79 y.o. male with no previous history of CAD. He developed chest pain on the day of admission that was severe. EMS was called and he was significantly bradycardic with a heart rate in the 40s. His ECG was consistent with an inferior STEMI. He was taken emergently to the cath lab.    Acute MI, inferior wall, initial episode of care - he had thrombectomy and PTCA of the RCA or proximal total occlusion. It was a dominant vessel. His EF was preserved. The LAD has a 90% mid stenosis for which medical therapy was recommended. He was initially started on Brilinta but had some shortness of breath, so this was changed to Plavix. He is tolerating the Plavix well.  Acute respiratory failure/cardiogenic shock -He required intubation prior to  cardiac catheterization for airway protection. He also required a temporary wire because he was in complete heart block. Critical care medicine was called to manage the ventilator. He had a long history of tobacco use and was managed carefully. Arterial and venous catheters were inserted to help manage his multiple medical problems. He required dopamine for pressure support but this was discontinued by 03/14/2013. On 03/14/2013, he was stable for extubation. After that he did well. Smoking cessation is strongly advised.  Chronic kidney disease stage III - on admission, his BUN was 31 and creatinine 1.6. After the catheterization, it went as high as 40/1.88. By discharge it had improved to 30/1.67 which is baseline.  Colostomy in place - medical ostomy bag in place upon arrival. This was managed carefully by the nursing staff. No problems were noted with it during his stay.  Hiatal hernia -after extubation, he had a swallowing study, results below. His diet was able to progress and at discharge she is tolerating by mouth intake well.  Hypotension - on admission, he was hypotensive and in complete heart block, therefore beta blocker was not initiated. His EF was preserved so he is not on a ACE inhibitor. He received pressors and he improved. He has a history of hypertension and his blood pressure had normalized by discharge.  Complete heart block - see on initial EMS ECGs. The temporary wire was used and he was pacing when necessary. His ECG improved in the temporary wire was discontinued. He had no further problems with heart block during his stay. Because of the heart block and hypotension, he was not started on  a beta blocker.  Anemia - he has a history of anemia prior to admission. His MCV was within normal limits. His hemoglobin by discharge was 9.6 and the decrease is felt secondary to blood draws and procedures. He is to followup with primary care for this.  UTI - Frederick Davis had a urinalysis and  culture done. The urinalysis was consistent with UTI and he was started on Cipro. The culture was positive for Proteus parabola's, sensitive to Cipro. He is to complete a seven-day course as an outpatient.  Dysphagia - after his extubation, he had some problems with dysphagia. He was seen by speech therapy and a swallowing study was performed, results below. He has a history of esophageal dilatation and is to followup with his primary physician for this.  As his condition improved, he was seen by physical therapy, and cardiac rehabilitation. His ambulation improved steadily. He is to follow up with cardiac rehabilitation as an outpatient. By 03/18/2013, Frederick Davis was ambulating without chest pain or shortness of breath. He was evaluated by Dr. Antoine Poche and considered stable for discharge, to follow up as an outpatient.  Labs:   Lab Results  Component Value Date   WBC 10.4 03/14/2013   HGB 9.6* 03/14/2013   HCT 29.4* 03/14/2013   MCV 83.8 03/14/2013   PLT 182 03/14/2013    Recent Labs Lab 03/12/13 1900  03/18/13 0900  NA 138  < > 140  K 4.4  < > 3.8  CL 104  < > 106  CO2 18*  < > 20  BUN 35*  < > 30*  CREATININE 1.58*  < > 1.67*  CALCIUM 9.3  < > 9.4  PROT 6.5  --   --   BILITOT 0.4  --   --   ALKPHOS 110  --   --   ALT 190*  --   --   AST 204*  --   --   GLUCOSE 253*  < > 139*  < > = values in this interval not displayed. Lab Results  Component Value Date   TROPONINI 15.01* 03/14/2013   Lipid Panel     Component Value Date/Time   CHOL 128 03/13/2013 0500   TRIG 101 03/13/2013 0500   HDL 63 03/13/2013 0500   CHOLHDL 2.0 03/13/2013 0500   VLDL 20 03/13/2013 0500   LDLCALC 45 03/13/2013 0500     Radiology: Dg Chest Port 1 View 03/14/2013   *RADIOLOGY REPORT*  Clinical Data: Evaluate pulmonary edema  PORTABLE CHEST - 1 VIEW  Comparison: 03/13/2013; 03/12/2013  Findings: Grossly unchanged enlarged cardiac silhouette and mediastinal contours given persistently reduced lung volumes.  Stable positioning of support apparatus.  No pneumothorax. Pulmonary vasculature is slightly less distinct on the present examination, in particular, within the right lung.  Grossly unchanged perihilar and medial basilar opacities.  Trace bilateral effusions are suspected.  Unchanged bones.  IMPRESSION: 1.  Stable positioning of support apparatus.  No pneumothorax. 2.  Suspected mild worsening of asymmetric pulmonary edema, worse within the right lung. 3.  Grossly unchanged bibasilar opacities, atelectasis versus infiltrate.   Original Report Authenticated By: Tacey Ruiz, MD   Dg Chest Port 1 View 03/13/2013   *RADIOLOGY REPORT*  Clinical Data: Ventilator dependent respiratory failure.  Follow up basilar atelectasis and/or pneumonia.  PORTABLE CHEST - 1 VIEW 03/13/2013 0441 hours:  Comparison: Portable chest x-rays yesterday.  Findings: Endotracheal tube tip in satisfactory position projecting approximately 5 cm above the carina.  Nasogastric tube  tip in the proximal stomach.  Suboptimal inspiration with atelectasis in the lower lobes, left greater than right, though aeration has improved since yesterday.  No new pulmonary parenchymal abnormalities. Stable cardiomegaly without pulmonary edema.  Temporary transvenous pacer lead tip at the expected location of the RV apex.  IMPRESSION: Support apparatus satisfactory.  Suboptimal inspiration which accounts for atelectasis in the lung bases, left greater than right, though aeration has improved since yesterday.  No new abnormalities.   Original Report Authenticated By: Hulan Saas, [name]   Dg Chest Port 1 View 03/12/2013   *RADIOLOGY REPORT*  Clinical Data: Nasogastric tube placement  PORTABLE CHEST - 1 VIEW  Comparison: 03/12/2013  Findings: Nasogastric tube terminates below the level of the diaphragms but the tip is not included on the film.  Endotracheal tube is appropriately positioned.  Cardiomegaly persist with retrocardiac airspace opacity and right  basilar presumed atelectasis.  IMPRESSION: Nasogastric tube terminates below the level of the diaphragms but the tip is not included on the film.   Original Report Authenticated By: Christiana Pellant, [name]   Dg Chest Port 1 View 03/12/2013   *RADIOLOGY REPORT*  Clinical Data: NG tube  PORTABLE CHEST - 1 VIEW  Comparison: 1740 hours  Findings: The tip of the NG tube is in the right lower lobe. Increased bibasilar atelectasis.  Otherwise stable study.  IMPRESSION: NG tube tip is in the right lower lobe.  Bibasilar atelectasis increased.   Original Report Authenticated By: Jolaine Click, [name]   Dg Chest Port 1 View 03/12/2013   *RADIOLOGY REPORT*  Clinical Data: Endotracheal tube placement, post thoracentesis  PORTABLE CHEST - 1 VIEW  Comparison: None.  Findings: Endotracheal tube is appropriately positioned. Lung volumes are low with crowding of the bronchovascular markings.  No pneumothorax.  Left costophrenic angle incompletely imaged. Cardiac leads obscure detail.  Mild enlargement of cardiac silhouette noted.  IMPRESSION: Endotracheal tube appropriately positioned.  No pneumothorax after thoracentesis.   Original Report Authenticated By: Christiana Pellant, [name]   Dg Swallowing Func-speech Pathology 03/17/2013   Royce Macadamia, CCC-SLP   Assessment / Plan / Recommendation Clinical Impression  Dysphagia Diagnosis: Mild pharyngeal phase dysphagia;Suspected  primary esophageal dysphagia Clinical impression: Pt. exhibited suspected esophageal dysphagia  that contributed to pharyngeal impairments.Marland KitchenMarland KitchenRadiologist stated pt. appears to have a  small Zenker's diverticulum and confirmed a large hiatal hernia  Diet Recommendation Regular;Dysphagia 1 (Puree)   Liquid Administration via: Cup;No straw Medication Administration: Whole meds with puree Supervision: Intermittent supervision to cue for compensatory  strategies;Patient able to self feed  Other  Recommendations Oral Care Recommendations: Oral care BID   Breck Coons  [place].Ed CCC-SLP Pager [phone]  03/17/2013   Cardiac Cath: 03/12/2013 Left main: No obstructive disease.  Left Anterior Descending Artery: Large caliber vessel that courses to the apex. 90% mid stenosis.  Circumflex Artery: Moderate caliber vessel with early intermediate branch, patent with mild plaque.  Right Coronary Artery: 100% proximal occlusion. At the conclusion of the case, this was a large dominant vessel.  Left Ventricular Angiogram: LVEF=65%.  Impression:  1. Acute inferior STEMI secondary to occluded proximal RCA complicated by cardiogenic shock/bradycardia.  2. Successful thrombectomy, PTCA with balloon angioplasty only of the RCA  3. Severe disease in the mid LAD  4. Preserved LV systolic function  Recommendations: Will attempt to load with ASA and Brilinta tonight. Will start Brilinta 90 mg per NG tube BID in the am. To CCU, intubated. PCCM on board. Will continue Integrilin drip for 18 hours. Angiomax  for two hours then stop. No beta blocker with hypotension. Will start statin  EKG: 14-Mar-2013 06:57:44   Sinus rhythm with 1st degree A-V block Left axis deviation Low voltage QRS T wave abnormality, consider inferior ischemia Vent. rate 63 BPM PR interval 246 ms QRS duration 96 ms QT/QTc 428/437 ms P-R-T axes 54 -37 -47  FOLLOW UP PLANS AND APPOINTMENTS No Known Allergies   Medication List    STOP taking these medications       amLODipine 10 MG tablet  Commonly known as:  NORVASC      TAKE these medications       aspirin 81 MG tablet  Take 1 tablet (81 mg total) by mouth daily.     atorvastatin 80 MG tablet  Commonly known as:  LIPITOR  Take 1 tablet (80 mg total) by mouth daily.     BIOFLEX Tabs  Take 1 tablet by mouth daily.     CALCIUM + D PO  Take 1 tablet by mouth daily.     ciprofloxacin 250 MG tablet  Commonly known as:  CIPRO  Take 1 tablet (250 mg total) by mouth 2 (two) times daily.     clopidogrel 75 MG tablet  Commonly known as:   PLAVIX  Take 1 tablet (75 mg total) by mouth daily.     feeding supplement Pudg  Take 1 Container by mouth daily.     glucosamine-chondroitin 500-400 MG tablet  Take 1 tablet by mouth daily.     levothyroxine 75 MCG tablet  Commonly known as:  SYNTHROID, LEVOTHROID  Take 75 mcg by mouth daily.     meclizine 12.5 MG tablet  Commonly known as:  ANTIVERT  Take 12.5-25 mg by mouth 3 (three) times daily as needed for dizziness.     multivitamin with minerals Tabs  Take 1 tablet by mouth daily.     nitroGLYCERIN 0.4 MG SL tablet  Commonly known as:  NITROSTAT  Place 1 tablet (0.4 mg total) under the tongue every 5 (five) minutes as needed for chest pain.     Vitamin D3 2000 UNITS Tabs  Take 2,000 Units by mouth daily.        Discharge Orders   Future Appointments Provider Department Dept Phone   05/10/2013 3:00 PM Ernestina Penna, MD [facility] [phone]   07/19/2013 9:00 AM Ernestina Penna, MD [facility] [phone]   Future Orders Complete By Expires     Amb Referral to Cardiac Rehabilitation  As directed     Diet - low sodium heart healthy  As directed     Increase activity slowly  As directed       Follow-up Information   Follow up with  [facility] (The office will call for followup appointment and echocardiogram)    Contact information:   [address] [place] [address]       BRING ALL MEDICATIONS WITH YOU TO FOLLOW UP APPOINTMENTS  Time spent with patient to include physician time: 41 min Signed: Theodore Demark, PA-C 03/18/2013, 2:40 PM Co-Sign MD  Patient seen and examined.  Plan as discussed in my rounding note for today and outlined above. Rollene Rotunda  03/18/2013  9:26 PM

## 2013-03-18 NOTE — Progress Notes (Signed)
   SUBJECTIVE:  Feels better.  Walked yesterday and even better this morning. He is anxious to go home.    PHYSICAL EXAM Filed Vitals:   03/17/13 2038 03/18/13 0300 03/18/13 0421 03/18/13 0502  BP: 126/81   118/80  Pulse: 73   73  Temp: 98.9 F (37.2 C)   98.2 F (36.8 C)  TempSrc: Oral   Oral  Resp: 18   18  Height:      Weight:  181 lb 14.4 oz (82.509 kg) 181 lb 14.4 oz (82.509 kg)   SpO2: 96%   96%   General:  No distress Lungs:  Few basilar crackles Heart:  RRR, no rub Abdomen:  Positive bowel sounds, no rebound no guarding Extremities:  No edema  LABS:  No results found for this or any previous visit (from the past 24 hour(s)).  Intake/Output Summary (Last 24 hours) at 03/18/13 0753 Last data filed at 03/18/13 8469  Gross per 24 hour  Intake    720 ml  Output   1275 ml  Net   -555 ml    ASSESSMENT AND PLAN:  UTI:  Started Cipro day 3/7.  Proteus mirabilis sensitive to Cipro.   CAD:  PCI/STENT RCA.  He has residual proximal LAD disease.  I reviewed the films.   I discussed this at length with the patient and his wife today. I will consider out patient stress imaging after he has recovered from this event.   Continue ASA.   I will switched this to Plavix yesterday because of dyspnea with Brilinta.  I will order an outpatient echochardiogram which can happen in 7 - 10 days when he presents for a transition of care visit.     03/18/2013 7:53 AM

## 2013-03-18 NOTE — Progress Notes (Signed)
Speech Language Pathology Dysphagia Treatment Patient Details Name: Frederick Davis MRN: [id number] DOB: 06-Mar-1923 Today's Date: 03/18/2013 Time: 9147-8295 SLP Time Calculation (min): 12 min  Assessment / Plan / Recommendation Clinical Impression  Pt. seen after MBS  yesterday.  Pt. asked to recall what he remembered of MBS for wife at bedside.  SLP reviewed results with verbal explanations and diaghram (Zenker's). He required mild-moderate cues to state all strategies.  SLP reviewed esophageal precautions and food/liquid consistencies.  Continue regular texture and thin liquids. He is being discharged home today.     Diet Recommendation  Continue with Current Diet: Regular;Thin liquid    SLP Plan     Pertinent Vitals/Pain none   Swallowing Goals  SLP Swallowing Goals Patient will consume recommended diet without observed clinical signs of aspiration with: Modified independent assistance Swallow Study Goal #1 - Progress: Partly Met Patient will utilize recommended strategies during swallow to increase swallowing safety with: Modified independent assistance Swallow Study Goal #2 - Progress: Partly met  General Temperature Spikes Noted: No Respiratory Status: Room air Behavior/Cognition: Alert;Cooperative;Pleasant mood Oral Cavity - Dentition: Adequate natural dentition Patient Positioning: Upright in bed  Oral Cavity - Oral Hygiene Does patient have any of the following "at risk" factors?: None of the above Brush patient's teeth BID with toothbrush (using toothpaste with fluoride): Yes Patient is AT RISK - Oral Care Protocol followed (see row info): Yes   Dysphagia Treatment Treatment focused on: Patient/family/caregiver education Family/Caregiver Educated: wife Treatment Methods/Modalities: Other (comment) (education) Patient observed directly with PO's: No Reason PO's not observed:  (education/ PA-C arrived)   GO    Royce Macadamia M.Ed CCC-SLP Pager  [phone]  03/18/2013  Royce Macadamia 03/18/2013, 1:57 PM

## 2013-03-18 NOTE — Progress Notes (Signed)
CARDIAC REHAB PHASE I   PRE:  Rate/Rhythm: 79 SR    BP: sitting 92/50    SaO2:   MODE:  Ambulation: 540 ft   POST:  Rate/Rhythm: 88 SR with 2 PVC (per strip length)    BP: sitting 142/70     SaO2:   Tolerated well, no c/o. BP low before walk, much increased after. All questions answered and plan to do CRPII in [place] after GXT. [id number]   Harriet Masson CES, ACSM 03/18/2013 9:49 AM

## 2013-03-25 ENCOUNTER — Ambulatory Visit (HOSPITAL_COMMUNITY): Payer: Medicare Other | Attending: Internal Medicine | Admitting: Radiology

## 2013-03-25 DIAGNOSIS — R072 Precordial pain: Secondary | ICD-10-CM | POA: Diagnosis not present

## 2013-03-25 DIAGNOSIS — Z87891 Personal history of nicotine dependence: Secondary | ICD-10-CM | POA: Diagnosis not present

## 2013-03-25 DIAGNOSIS — R079 Chest pain, unspecified: Secondary | ICD-10-CM | POA: Insufficient documentation

## 2013-03-25 DIAGNOSIS — I10 Essential (primary) hypertension: Secondary | ICD-10-CM | POA: Diagnosis not present

## 2013-03-25 DIAGNOSIS — I200 Unstable angina: Secondary | ICD-10-CM

## 2013-03-25 NOTE — Progress Notes (Signed)
Echocardiogram performed.  

## 2013-03-26 ENCOUNTER — Encounter: Payer: Self-pay | Admitting: Cardiology

## 2013-03-26 ENCOUNTER — Ambulatory Visit (INDEPENDENT_AMBULATORY_CARE_PROVIDER_SITE_OTHER): Payer: Medicare Other | Admitting: Cardiology

## 2013-03-26 VITALS — BP 124/76 | HR 64 | Ht 66.0 in | Wt 176.1 lb

## 2013-03-26 DIAGNOSIS — I2119 ST elevation (STEMI) myocardial infarction involving other coronary artery of inferior wall: Secondary | ICD-10-CM | POA: Diagnosis not present

## 2013-03-26 NOTE — Patient Instructions (Addendum)
The current medical regimen is effective;  continue present plan and medications.  Your physician has requested that you have a lexiscan myoview. For further information please visit [email or web address] Please follow instruction sheet, as given.  You have been referred to cardiac rehab at [facility] and will be contacted by them on when to start.  Follow up with Dr Antoine Poche in 3 months.

## 2013-03-26 NOTE — Progress Notes (Signed)
HPI The patient presents for followup after a recent acute inferior wall infarct. He had thrombectomy of a right coronary artery occlusion. He was initially acute respiratory failure and was cooled. However, he responded very well to therapy.  He presents for transition of care. We spoke with him yesterday when he came back for his echocardiogram which demonstrated a well preserved ejection fraction. Since he got out of the hospital he has done remarkably well. He's doing a little bit of activity he denies any chest pressure, neck or arm discomfort. He has no significant shortness of breath, PND or orthopnea. He has had no palpitations, presyncope or syncope. He's had a little bit of a decreased appetite and some fatigue as would be expected.  No Known Allergies  Current Outpatient Prescriptions  Medication Sig Dispense Refill  . aspirin 81 MG tablet Take 1 tablet (81 mg total) by mouth daily.  30 tablet    . atorvastatin (LIPITOR) 80 MG tablet Take 1 tablet (80 mg total) by mouth daily.  30 tablet  11  . Bioflavonoid Products (BIOFLEX) TABS Take 1 tablet by mouth daily.      . Calcium Carbonate-Vitamin D (CALCIUM + D PO) Take 1 tablet by mouth daily.      . Cholecalciferol (VITAMIN D3) 2000 UNITS TABS Take 2,000 Units by mouth daily.      . clopidogrel (PLAVIX) 75 MG tablet Take 1 tablet (75 mg total) by mouth daily.  30 tablet  11  . levothyroxine (SYNTHROID, LEVOTHROID) 75 MCG tablet Take 75 mcg by mouth daily.      . meclizine (ANTIVERT) 12.5 MG tablet Take 12.5-25 mg by mouth 3 (three) times daily as needed for dizziness.      . Multiple Vitamin (MULITIVITAMIN WITH MINERALS) TABS Take 1 tablet by mouth daily.      . nitroGLYCERIN (NITROSTAT) 0.4 MG SL tablet Place 1 tablet (0.4 mg total) under the tongue every 5 (five) minutes as needed for chest pain.  25 tablet  3  . feeding supplement (ENSURE) PUDG Take 1 Container by mouth daily.  30 Container  0   No current facility-administered  medications for this visit.   Facility-Administered Medications Ordered in Other Visits  Medication Dose Route Frequency Provider Last Rate Last Dose  . etomidate (AMIDATE) injection    PRN [name], CRNA   10 mg at 03/12/13 1541  . lidocaine (cardiac) 100 mg/90ml (XYLOCAINE) 20 MG/ML injection 2%    PRN [name], CRNA   80 mg at 03/12/13 1541  . succinylcholine (ANECTINE) injection    PRN [name], CRNA   100 mg at 03/12/13 1541    Past Medical History  Diagnosis Date  . Hypothyroidism   . Hypertension   . Rectum cancer 1986  . PC (prostate cancer) 1987  . Renal cell cancer   . Osteoporosis   . Esophageal stricture   . Anemia   . CAD (coronary artery disease)     Inferior MI.  LAD mid 80% stenosis, circ mild plaque, RCA 100% with thrombectomy PTCA.  EF 65%  . CKD (chronic kidney disease)     Past Surgical History  Procedure Laterality Date  . Kidney surgery  2004  . Radiofrequency ablation kidney  2006  . Hernia repair      2007  . Renal sphincter appliance  2007  . Colonoscopy  01/08/2012    Procedure: COLONOSCOPY;  Surgeon: Malissa Hippo, MD;  Location: [facility];  Service: Endoscopy;  Laterality: N/A;  1030  . Colostomy Left 1986    colon cancer    ROS:  As stated in the HPI and negative for all other systems.  PHYSICAL EXAM BP 124/76  Pulse 64  Ht 5\' 6"  (1.676 m)  Wt 176 lb 1.9 oz (79.888 kg)  BMI 28.44 kg/m2 GENERAL:  Well appearing HEENT:  Pupils equal round and reactive, fundi not visualized, oral mucosa unremarkable NECK:  No jugular venous distention, waveform within normal limits, carotid upstroke brisk and symmetric, no bruits, no thyromegaly LYMPHATICS:  No cervical, inguinal adenopathy LUNGS:  Clear to auscultation bilaterally BACK:  No CVA tenderness, mild lordosis CHEST:  Unremarkable HEART:  PMI not displaced or sustained,S1 and S2 within normal limits, no S3, no S4, no clicks, no rubs, no murmurs ABD:  Flat, positive bowel  sounds normal in frequency in pitch, no bruits, no rebound, no guarding, no midline pulsatile mass, no hepatomegaly, no splenomegaly, , colostomy bag in place EXT:  2 plus pulses throughout, no edema, no cyanosis no clubbing SKIN:  No rashes no nodules NEURO:  Cranial nerves II through XII grossly intact, motor grossly intact throughout PSYCH:  Cognitively intact, oriented to person place and time  ASSESSMENT AND PLAN  CAD:  The patient has no ongoing symptoms. He does have residual LAD disease and the plan will be to perform stress testing. He would not be able to walk on a treadmill so he will have a [employer].  I would like him to participate in cardiac.  CKD:  I reviewed his labs prior to discharge and his creatinine was back to baseline. I will defer to Dr. Christell Constant.  HTN:  He is off of his Norvasc. I might titrate low-dose beta blocker over time. For now his blood pressure is well controlled.  DYSLIPIDEMIA:  He will continue current Lipitor and couldn't have this followed by Dr. Christell Constant.

## 2013-03-26 NOTE — Progress Notes (Deleted)
   SUBJECTIVE:  ***   PHYSICAL EXAM There were no vitals filed for this visit. General:  *** Lungs:  *** Heart:  *** Abdomen:  *** Extremities:  ***  LABS: Lab Results  Component Value Date   TROPONINI 15.01* 03/14/2013   No results found for this or any previous visit (from the past 24 hour(s)). @iobrief @  EKG:    ASSESSMENT AND PLAN:  Active Problems:   * No active hospital problems. Fayrene Fearing [facility] 03/26/2013 8:55 AM

## 2013-04-06 ENCOUNTER — Ambulatory Visit (HOSPITAL_COMMUNITY): Payer: Medicare Other | Attending: Cardiology | Admitting: Radiology

## 2013-04-06 VITALS — BP 146/83 | Ht 66.0 in | Wt 174.0 lb

## 2013-04-06 DIAGNOSIS — I10 Essential (primary) hypertension: Secondary | ICD-10-CM | POA: Insufficient documentation

## 2013-04-06 DIAGNOSIS — I252 Old myocardial infarction: Secondary | ICD-10-CM | POA: Diagnosis not present

## 2013-04-06 DIAGNOSIS — I4510 Unspecified right bundle-branch block: Secondary | ICD-10-CM | POA: Diagnosis not present

## 2013-04-06 DIAGNOSIS — Z87891 Personal history of nicotine dependence: Secondary | ICD-10-CM | POA: Diagnosis not present

## 2013-04-06 DIAGNOSIS — R002 Palpitations: Secondary | ICD-10-CM | POA: Insufficient documentation

## 2013-04-06 DIAGNOSIS — R5381 Other malaise: Secondary | ICD-10-CM | POA: Diagnosis not present

## 2013-04-06 DIAGNOSIS — I2510 Atherosclerotic heart disease of native coronary artery without angina pectoris: Secondary | ICD-10-CM

## 2013-04-06 DIAGNOSIS — R079 Chest pain, unspecified: Secondary | ICD-10-CM

## 2013-04-06 DIAGNOSIS — Z9861 Coronary angioplasty status: Secondary | ICD-10-CM | POA: Diagnosis not present

## 2013-04-06 DIAGNOSIS — I2119 ST elevation (STEMI) myocardial infarction involving other coronary artery of inferior wall: Secondary | ICD-10-CM

## 2013-04-06 DIAGNOSIS — I4949 Other premature depolarization: Secondary | ICD-10-CM | POA: Diagnosis not present

## 2013-04-06 MED ORDER — REGADENOSON 0.4 MG/5ML IV SOLN
0.4000 mg | Freq: Once | INTRAVENOUS | Status: AC
  Administered 2013-04-06: 0.4 mg via INTRAVENOUS

## 2013-04-06 MED ORDER — TECHNETIUM TC 99M SESTAMIBI GENERIC - CARDIOLITE
33.0000 | Freq: Once | INTRAVENOUS | Status: AC | PRN
  Administered 2013-04-06: 33 via INTRAVENOUS

## 2013-04-06 MED ORDER — TECHNETIUM TC 99M SESTAMIBI GENERIC - CARDIOLITE
11.0000 | Freq: Once | INTRAVENOUS | Status: AC | PRN
  Administered 2013-04-06: 11 via INTRAVENOUS

## 2013-04-06 MED ORDER — AMINOPHYLLINE 25 MG/ML IV SOLN
75.0000 mg | Freq: Once | INTRAVENOUS | Status: AC
  Administered 2013-04-06: 75 mg via INTRAVENOUS

## 2013-04-06 NOTE — Progress Notes (Signed)
[facility] SITE 3 NUCLEAR MED [address] [place], [place] [address] [phone]    Cardiology Nuclear Med Study  Frederick Davis is a 79 y.o. male     MRN : [id number]     DOB: October 07, 1923  Procedure Date: 04/06/2013  Nuclear Med Background Indication for Stress Test:  Evaluation for Ischemia and PTCA Patency History:  5/14 MI-STEMI Acute Res.Failure with CHB-Heart Cath: EF: 65% Angioplasty RCA 03/25/13 ECHO: EF:55-60%  Cardiac Risk Factors: History of Smoking, Hypertension and RBBB  Symptoms:  Fatigue and Palpitations   Nuclear Pre-Procedure Caffeine/Decaff Intake:  None NPO After: 7:00pm   Lungs:  clear O2 Sat: 97% on room air. IV 0.9% NS with Angio Cath:  22g  IV Site: R Antecubital  IV Started by:  Stanton Kidney, EMT-P  Chest Size (in):  42 Cup Size: n/a  Height: 5\' 6"  (1.676 m)  Weight:  174 lb (78.926 kg)  BMI:  Body mass index is 28.1 kg/(m^2). Tech Comments:  Meds were taken as directed, per patient. This patient had sob, a strange feeling in his chest, and chest heaviness with the Lexiscan injection. Aminophylline IV was given to reverse all symptoms.    Nuclear Med Study 1 or 2 day study: 1 day  Stress Test Type:  Eugenie Birks  Reading MD: Charlton Haws, MD  Order Authorizing Provider:  J.Hochrein MD  Resting Radionuclide: Technetium 67m Sestamibi  Resting Radionuclide Dose: 11.0 mCi   Stress Radionuclide:  Technetium 79m Sestamibi  Stress Radionuclide Dose: 33.0 mCi           Stress Protocol Rest HR: 64 Stress HR: 90  Rest BP: 146/83 Stress BP: 138/87  Exercise Time (min): n/a METS: n/a   Predicted Max HR: 132 bpm % Max HR: 68.18 bpm Rate Pressure Product: 69629   Dose of Adenosine (mg):  n/a Dose of Lexiscan: 0.4 mg  Dose of Atropine (mg): n/a Dose of Dobutamine: n/a mcg/kg/min (at max HR)  Stress Test Technologist: Milana Na, EMT-P  Nuclear Technologist:  Doyne Keel, CNMT     Rest Procedure:  Myocardial perfusion imaging was performed at  rest 45 minutes following the intravenous administration of Technetium 66m Sestamibi. Rest ECG: NSR - Normal EKG  Stress Procedure:  The patient received IV Lexiscan 0.4 mg over 15-seconds.  Technetium 100m Sestamibi injected at 30-seconds. This patient had sob, a strange feeling in his chest, and chest heaviness with the Lexiscan injection. Quantitative spect images were obtained after a 45 minute delay. Stress ECG: No significant change from baseline ECG  QPS Raw Data Images:  Patient motion noted. Stress Images:  Decreased uptake in the mid and basal inferolateral wall Rest Images:  Inferolateral wall thinning Subtraction (SDS):  Mild mixed scar and ischemia Transient Ischemic Dilatation (Normal <1.22):  1.08 Lung/Heart Ratio (Normal <0.45):  0.31  Quantitative Gated Spect Images QGS EDV:  99 ml QGS ESV:  43 ml  Impression Exercise Capacity:  Lexiscan with no exercise. BP Response:  Normal blood pressure response. Clinical Symptoms:  There is dyspnea. ECG Impression:  No significant ST segment change suggestive of ischemia. Comparison with Prior Nuclear Study: No images to compare  Overall Impression:  Low risk stress nuclear study Small inferolateral wall infarct with mild periinfarct ischemia involving the mid and basal inferolateral wall.  LV Ejection Fraction: 57%.  LV Wall Motion:  NL LV Function; NL Wall Motion or Normal Wall Motion  Charlton Haws

## 2013-04-09 ENCOUNTER — Telehealth: Payer: Self-pay | Admitting: Cardiology

## 2013-04-09 NOTE — Telephone Encounter (Signed)
New Problem:    Patient called in wanting to know the results of his latest Stress Test.  Please call back.

## 2013-04-09 NOTE — Telephone Encounter (Signed)
Pt notified of stress test results.

## 2013-04-29 ENCOUNTER — Encounter (HOSPITAL_COMMUNITY)
Admission: RE | Admit: 2013-04-29 | Discharge: 2013-04-29 | Disposition: A | Discharge status: Home / Self Care | Payer: Medicare Other | Source: Ambulatory Visit | Attending: Cardiology | Admitting: Cardiology

## 2013-04-29 ENCOUNTER — Encounter (HOSPITAL_COMMUNITY): Payer: Self-pay

## 2013-04-29 VITALS — BP 140/88 | HR 54 | Ht 66.0 in | Wt 178.4 lb

## 2013-04-29 DIAGNOSIS — Z9861 Coronary angioplasty status: Secondary | ICD-10-CM

## 2013-04-29 DIAGNOSIS — I219 Acute myocardial infarction, unspecified: Secondary | ICD-10-CM

## 2013-04-29 DIAGNOSIS — I252 Old myocardial infarction: Secondary | ICD-10-CM | POA: Insufficient documentation

## 2013-04-29 DIAGNOSIS — Z5189 Encounter for other specified aftercare: Secondary | ICD-10-CM | POA: Insufficient documentation

## 2013-04-29 NOTE — Patient Instructions (Signed)
Pt has finished orientation and is scheduled to start CR on 04/29/13 at 8:15 am. Pt has been instructed to arrive to class 15 minutes early for scheduled class. Pt has been instructed to wear comfortable clothing and shoes with rubber soles. Pt has been told to take their medications 1 hour prior to coming to class.  If the patient is not going to attend class, he/she has been instructed to call.

## 2013-04-29 NOTE — Progress Notes (Signed)
Patient was referred to Cardiac REhab by Dr. Antoine Poche due to MI and PTCA w/baloon angioplasty. During orientation advised patient on arrival and appointment times what to wear, what to do before, during and after exercise. Reviewed attendance and class policy. Talked about inclement weather and class consultation policy. Pt is scheduled to start Cardiac Rehab on _7/14/14 at 8:15. Pt was advised to come to class 5 minutes before class starts. He was also given instructions on meeting with the dietician and attending the Family Structure classes. Pt is eager to get started.

## 2013-05-03 ENCOUNTER — Encounter (HOSPITAL_COMMUNITY)
Admission: RE | Admit: 2013-05-03 | Discharge: 2013-05-03 | Disposition: A | Discharge status: Home / Self Care | Payer: Medicare Other | Source: Ambulatory Visit | Attending: Cardiology | Admitting: Cardiology

## 2013-05-03 DIAGNOSIS — I252 Old myocardial infarction: Secondary | ICD-10-CM | POA: Diagnosis not present

## 2013-05-03 DIAGNOSIS — Z5189 Encounter for other specified aftercare: Secondary | ICD-10-CM | POA: Diagnosis not present

## 2013-05-05 ENCOUNTER — Encounter (HOSPITAL_COMMUNITY)
Admission: RE | Admit: 2013-05-05 | Discharge: 2013-05-05 | Disposition: A | Discharge status: Home / Self Care | Payer: Medicare Other | Source: Ambulatory Visit | Attending: Cardiology | Admitting: Cardiology

## 2013-05-05 DIAGNOSIS — I252 Old myocardial infarction: Secondary | ICD-10-CM | POA: Diagnosis not present

## 2013-05-05 DIAGNOSIS — Z5189 Encounter for other specified aftercare: Secondary | ICD-10-CM | POA: Diagnosis not present

## 2013-05-06 ENCOUNTER — Encounter (HOSPITAL_COMMUNITY): Payer: Medicare Other

## 2013-05-07 ENCOUNTER — Encounter (HOSPITAL_COMMUNITY)
Admission: RE | Admit: 2013-05-07 | Discharge: 2013-05-07 | Disposition: A | Discharge status: Home / Self Care | Payer: Medicare Other | Source: Ambulatory Visit | Attending: Cardiology | Admitting: Cardiology

## 2013-05-07 DIAGNOSIS — I252 Old myocardial infarction: Secondary | ICD-10-CM | POA: Diagnosis not present

## 2013-05-07 DIAGNOSIS — Z5189 Encounter for other specified aftercare: Secondary | ICD-10-CM | POA: Diagnosis not present

## 2013-05-10 ENCOUNTER — Encounter (HOSPITAL_COMMUNITY)
Admission: RE | Admit: 2013-05-10 | Discharge: 2013-05-10 | Disposition: A | Discharge status: Home / Self Care | Payer: Medicare Other | Source: Ambulatory Visit | Attending: Cardiology | Admitting: Cardiology

## 2013-05-10 ENCOUNTER — Ambulatory Visit (INDEPENDENT_AMBULATORY_CARE_PROVIDER_SITE_OTHER): Payer: Medicare Other | Admitting: Family Medicine

## 2013-05-10 ENCOUNTER — Encounter: Payer: Self-pay | Admitting: Family Medicine

## 2013-05-10 VITALS — BP 117/71 | HR 54 | Temp 97.5°F | Ht 65.5 in | Wt 177.0 lb

## 2013-05-10 DIAGNOSIS — I252 Old myocardial infarction: Secondary | ICD-10-CM | POA: Diagnosis not present

## 2013-05-10 DIAGNOSIS — N289 Disorder of kidney and ureter, unspecified: Secondary | ICD-10-CM

## 2013-05-10 DIAGNOSIS — I10 Essential (primary) hypertension: Secondary | ICD-10-CM | POA: Diagnosis not present

## 2013-05-10 DIAGNOSIS — Z5189 Encounter for other specified aftercare: Secondary | ICD-10-CM | POA: Diagnosis not present

## 2013-05-10 DIAGNOSIS — I2510 Atherosclerotic heart disease of native coronary artery without angina pectoris: Secondary | ICD-10-CM | POA: Diagnosis not present

## 2013-05-10 DIAGNOSIS — N2889 Other specified disorders of kidney and ureter: Secondary | ICD-10-CM

## 2013-05-10 DIAGNOSIS — K449 Diaphragmatic hernia without obstruction or gangrene: Secondary | ICD-10-CM

## 2013-05-10 DIAGNOSIS — I2119 ST elevation (STEMI) myocardial infarction involving other coronary artery of inferior wall: Secondary | ICD-10-CM

## 2013-05-10 DIAGNOSIS — C61 Malignant neoplasm of prostate: Secondary | ICD-10-CM

## 2013-05-10 NOTE — Progress Notes (Signed)
  Subjective:    Patient ID: Frederick Davis, male    DOB: [birth year], 79 y.o.   MRN: [id number]  HPI Patient returns to clinic for followup of chronic medical problems. In May he sustained an inferior wall MI. He apparently arrested on the way to the hospital as his recovery he has done well. He is currently getting rehabilitation therapy at the hospital. In addition he has hypertension, hyperlipidemia, coronary artery disease, and chronic kidney disease. He has a history of rectal cancer prostate cancer and renal cell carcinoma. He has a left renal mass cysts on continued followup from him [facility]. The results of the last ultrasound done sometime since April of 2014 is not available.   Review of Systems  Constitutional: Positive for fatigue.  HENT: Negative.   Eyes: Negative.   Respiratory: Negative.   Cardiovascular: Negative.   Gastrointestinal: Negative.   Endocrine: Negative.   Genitourinary: Negative.   Musculoskeletal: Positive for arthralgias.  Skin: Negative.   Allergic/Immunologic: Negative.   Neurological: Negative.   Hematological: Bruises/bleeds easily (on plavix and asa).  Psychiatric/Behavioral: Negative.        Objective:   Physical Exam BP 117/71  Pulse 54  Temp(Src) 97.5 F (36.4 C) (Oral)  Ht 5' 5.5" (1.664 m)  Wt 177 lb (80.287 kg)  BMI 29 kg/m2  The patient appeared well nourished and normally developed for his age and medical conditions, alert and oriented to time and place. Speech, behavior and judgement appear normal. Vital signs as documented.  Head exam is unremarkable. No scleral icterus or pallor noted. HEENT within normal limits Neck is without jugular venous distension, thyromegally, or carotid bruits. Carotid upstrokes are brisk bilaterally. No cervical adenopathy. Lungs are clear anteriorly and posteriorly to auscultation. Normal respiratory effort. Cardiac exam reveals regular rate and rhythm at 72 per minute. First and second  heart sounds normal.  No murmurs, rubs or gallops.  Abdominal exam reveals normal bowl sounds, no masses, no organomegaly and no aortic enlargement. No inguinal adenopathy. His colostomy bag is in place and appears to be functioning normally Extremities are nonedematous and both femoral  pulses are normal. Skin without pallor or jaundice.  Warm and dry, without rash. There are a few bruises scattered over his body but nothing significant. Neurologic exam reveals normal deep tendon reflexes and normal sensation.          Assessment & Plan:  1. Acute MI, inferior wall, subsequent episodes of care -Continue cardiac rehabilitation as prescribed by cardiologist  2. CKD (chronic kidney disease) stage 3, GFR 30-59 ml/min -Lab work will be ordered. BMP  3. Coronary atherosclerosis of native coronary artery -Continue cholesterol medication and cardiac rehabilitation  4. Essential hypertension, benig  5. Hiatal hernia  6. Prostate cancer  7. Left renal mass -Try to get copy of ultrasound report from [facility] -See note scan in 2 media  Labs are planned  Patient Instructions  Fall prevention discussed at visit Continue cardiac rehabilitation Continue current treatment   Nyra Capes MD

## 2013-05-10 NOTE — Patient Instructions (Addendum)
Fall prevention discussed at visit Continue cardiac rehabilitation Continue current treatment

## 2013-05-12 ENCOUNTER — Encounter (HOSPITAL_COMMUNITY)
Admission: RE | Admit: 2013-05-12 | Discharge: 2013-05-12 | Disposition: A | Discharge status: Home / Self Care | Payer: Medicare Other | Source: Ambulatory Visit | Attending: Cardiology | Admitting: Cardiology

## 2013-05-12 DIAGNOSIS — Z5189 Encounter for other specified aftercare: Secondary | ICD-10-CM | POA: Diagnosis not present

## 2013-05-12 DIAGNOSIS — I252 Old myocardial infarction: Secondary | ICD-10-CM | POA: Diagnosis not present

## 2013-05-14 ENCOUNTER — Encounter (HOSPITAL_COMMUNITY)
Admission: RE | Admit: 2013-05-14 | Discharge: 2013-05-14 | Disposition: A | Discharge status: Home / Self Care | Payer: Medicare Other | Source: Ambulatory Visit | Attending: Cardiology | Admitting: Cardiology

## 2013-05-14 DIAGNOSIS — I252 Old myocardial infarction: Secondary | ICD-10-CM | POA: Diagnosis not present

## 2013-05-14 DIAGNOSIS — Z5189 Encounter for other specified aftercare: Secondary | ICD-10-CM | POA: Diagnosis not present

## 2013-05-17 ENCOUNTER — Encounter (HOSPITAL_COMMUNITY)
Admission: RE | Admit: 2013-05-17 | Discharge: 2013-05-17 | Disposition: A | Discharge status: Home / Self Care | Payer: Medicare Other | Source: Ambulatory Visit | Attending: Cardiology | Admitting: Cardiology

## 2013-05-17 DIAGNOSIS — Z5189 Encounter for other specified aftercare: Secondary | ICD-10-CM | POA: Diagnosis not present

## 2013-05-17 DIAGNOSIS — I252 Old myocardial infarction: Secondary | ICD-10-CM | POA: Diagnosis not present

## 2013-05-19 ENCOUNTER — Encounter (HOSPITAL_COMMUNITY)
Admission: RE | Admit: 2013-05-19 | Discharge: 2013-05-19 | Disposition: A | Discharge status: Home / Self Care | Payer: Medicare Other | Source: Ambulatory Visit | Attending: Cardiology | Admitting: Cardiology

## 2013-05-19 DIAGNOSIS — Z5189 Encounter for other specified aftercare: Secondary | ICD-10-CM | POA: Diagnosis not present

## 2013-05-19 DIAGNOSIS — I252 Old myocardial infarction: Secondary | ICD-10-CM | POA: Diagnosis not present

## 2013-05-21 ENCOUNTER — Encounter (HOSPITAL_COMMUNITY)
Admission: RE | Admit: 2013-05-21 | Discharge: 2013-05-21 | Disposition: A | Discharge status: Home / Self Care | Payer: Medicare Other | Source: Ambulatory Visit | Attending: Cardiology | Admitting: Cardiology

## 2013-05-21 DIAGNOSIS — Z5189 Encounter for other specified aftercare: Secondary | ICD-10-CM | POA: Insufficient documentation

## 2013-05-21 DIAGNOSIS — I252 Old myocardial infarction: Secondary | ICD-10-CM | POA: Insufficient documentation

## 2013-05-24 ENCOUNTER — Encounter (HOSPITAL_COMMUNITY)
Admission: RE | Admit: 2013-05-24 | Discharge: 2013-05-24 | Disposition: A | Discharge status: Home / Self Care | Payer: Medicare Other | Source: Ambulatory Visit | Attending: Cardiology | Admitting: Cardiology

## 2013-05-26 ENCOUNTER — Encounter (HOSPITAL_COMMUNITY)
Admission: RE | Admit: 2013-05-26 | Discharge: 2013-05-26 | Disposition: A | Discharge status: Home / Self Care | Payer: Medicare Other | Source: Ambulatory Visit | Attending: Cardiology | Admitting: Cardiology

## 2013-05-27 ENCOUNTER — Other Ambulatory Visit (INDEPENDENT_AMBULATORY_CARE_PROVIDER_SITE_OTHER): Payer: Medicare Other

## 2013-05-27 DIAGNOSIS — N289 Disorder of kidney and ureter, unspecified: Secondary | ICD-10-CM | POA: Diagnosis not present

## 2013-05-27 DIAGNOSIS — C61 Malignant neoplasm of prostate: Secondary | ICD-10-CM | POA: Diagnosis not present

## 2013-05-27 DIAGNOSIS — I2119 ST elevation (STEMI) myocardial infarction involving other coronary artery of inferior wall: Secondary | ICD-10-CM

## 2013-05-27 DIAGNOSIS — I2510 Atherosclerotic heart disease of native coronary artery without angina pectoris: Secondary | ICD-10-CM

## 2013-05-27 DIAGNOSIS — I10 Essential (primary) hypertension: Secondary | ICD-10-CM

## 2013-05-27 DIAGNOSIS — K449 Diaphragmatic hernia without obstruction or gangrene: Secondary | ICD-10-CM | POA: Diagnosis not present

## 2013-05-27 DIAGNOSIS — N2889 Other specified disorders of kidney and ureter: Secondary | ICD-10-CM

## 2013-05-27 LAB — HEPATIC FUNCTION PANEL
ALT: 34 U/L (ref 0–53)
AST: 26 U/L (ref 0–37)
Albumin: 4.3 g/dL (ref 3.5–5.2)
Alkaline Phosphatase: 91 U/L (ref 39–117)
Bilirubin, Direct: 0.2 mg/dL (ref 0.0–0.3)
Indirect Bilirubin: 0.6 mg/dL (ref 0.0–0.9)
Total Bilirubin: 0.8 mg/dL (ref 0.3–1.2)
Total Protein: 6.8 g/dL (ref 6.0–8.3)

## 2013-05-27 LAB — POCT CBC
Granulocyte percent: 77.8 %G (ref 37–80)
Lymph, poc: 1.3 (ref 0.6–3.4)
MCV: 82.4 fL (ref 80–97)
Platelet Count, POC: 231 10*3/uL (ref 142–424)
RDW, POC: 14.1 %
WBC: 6.7 10*3/uL (ref 4.6–10.2)

## 2013-05-28 ENCOUNTER — Encounter (HOSPITAL_COMMUNITY)
Admission: RE | Admit: 2013-05-28 | Discharge: 2013-05-28 | Disposition: A | Discharge status: Home / Self Care | Payer: Medicare Other | Source: Ambulatory Visit | Attending: Cardiology | Admitting: Cardiology

## 2013-05-28 LAB — NMR LIPOPROFILE WITH LIPIDS
HDL Size: 9.5 nm (ref >=9.2)
Large HDL-P: 10.1 umol/L (ref >=4.8)
Large VLDL-P: 1.1 nmol/L (ref <=2.7)
Small LDL Particle Number: 109 nmol/L (ref <=527)

## 2013-05-28 LAB — BASIC METABOLIC PANEL WITH GFR
BUN: 31 mg/dL — ABNORMAL HIGH (ref 6–23)
Calcium: 10.6 mg/dL — ABNORMAL HIGH (ref 8.4–10.5)
Creat: 1.4 mg/dL — ABNORMAL HIGH (ref 0.50–1.35)
GFR, Est African American: 51 mL/min — ABNORMAL LOW
GFR, Est Non African American: 45 mL/min — ABNORMAL LOW
Potassium: 5.1 mEq/L (ref 3.5–5.3)

## 2013-05-28 NOTE — Progress Notes (Signed)
Patient came in for labs only.

## 2013-05-31 ENCOUNTER — Encounter (HOSPITAL_COMMUNITY)
Admission: RE | Admit: 2013-05-31 | Discharge: 2013-05-31 | Disposition: A | Discharge status: Home / Self Care | Payer: Medicare Other | Source: Ambulatory Visit | Attending: Cardiology | Admitting: Cardiology

## 2013-06-02 ENCOUNTER — Encounter (HOSPITAL_COMMUNITY)
Admission: RE | Admit: 2013-06-02 | Discharge: 2013-06-02 | Disposition: A | Discharge status: Home / Self Care | Payer: Medicare Other | Source: Ambulatory Visit | Attending: Cardiology | Admitting: Cardiology

## 2013-06-04 ENCOUNTER — Encounter (HOSPITAL_COMMUNITY)
Admission: RE | Admit: 2013-06-04 | Discharge: 2013-06-04 | Disposition: A | Discharge status: Home / Self Care | Payer: Medicare Other | Source: Ambulatory Visit | Attending: Cardiology | Admitting: Cardiology

## 2013-06-07 ENCOUNTER — Encounter (HOSPITAL_COMMUNITY)
Admission: RE | Admit: 2013-06-07 | Discharge: 2013-06-07 | Disposition: A | Discharge status: Home / Self Care | Payer: Medicare Other | Source: Ambulatory Visit | Attending: Cardiology | Admitting: Cardiology

## 2013-06-09 ENCOUNTER — Encounter (HOSPITAL_COMMUNITY)
Admission: RE | Admit: 2013-06-09 | Discharge: 2013-06-09 | Disposition: A | Discharge status: Home / Self Care | Payer: Medicare Other | Source: Ambulatory Visit | Attending: Cardiology | Admitting: Cardiology

## 2013-06-11 ENCOUNTER — Encounter (HOSPITAL_COMMUNITY): Payer: Medicare Other

## 2013-06-14 ENCOUNTER — Encounter (HOSPITAL_COMMUNITY)
Admission: RE | Admit: 2013-06-14 | Discharge: 2013-06-14 | Disposition: A | Discharge status: Home / Self Care | Payer: Medicare Other | Source: Ambulatory Visit | Attending: Cardiology | Admitting: Cardiology

## 2013-06-16 ENCOUNTER — Encounter (HOSPITAL_COMMUNITY): Payer: Medicare Other

## 2013-06-16 ENCOUNTER — Telehealth: Payer: Self-pay | Admitting: Cardiology

## 2013-06-18 ENCOUNTER — Encounter (HOSPITAL_COMMUNITY)
Admission: RE | Admit: 2013-06-18 | Discharge: 2013-06-18 | Disposition: A | Discharge status: Home / Self Care | Payer: Medicare Other | Source: Ambulatory Visit | Attending: Cardiology | Admitting: Cardiology

## 2013-06-21 ENCOUNTER — Encounter (HOSPITAL_COMMUNITY): Payer: Medicare Other

## 2013-06-23 ENCOUNTER — Encounter (HOSPITAL_COMMUNITY)
Admission: RE | Admit: 2013-06-23 | Discharge: 2013-06-23 | Disposition: A | Discharge status: Home / Self Care | Payer: Medicare Other | Source: Ambulatory Visit | Attending: Cardiology | Admitting: Cardiology

## 2013-06-23 DIAGNOSIS — I252 Old myocardial infarction: Secondary | ICD-10-CM | POA: Diagnosis not present

## 2013-06-23 DIAGNOSIS — Z5189 Encounter for other specified aftercare: Secondary | ICD-10-CM | POA: Insufficient documentation

## 2013-06-25 ENCOUNTER — Encounter (HOSPITAL_COMMUNITY)
Admission: RE | Admit: 2013-06-25 | Discharge: 2013-06-25 | Disposition: A | Discharge status: Home / Self Care | Payer: Medicare Other | Source: Ambulatory Visit | Attending: Cardiology | Admitting: Cardiology

## 2013-06-28 ENCOUNTER — Encounter (HOSPITAL_COMMUNITY)
Admission: RE | Admit: 2013-06-28 | Discharge: 2013-06-28 | Disposition: A | Discharge status: Home / Self Care | Payer: Medicare Other | Source: Ambulatory Visit | Attending: Cardiology | Admitting: Cardiology

## 2013-06-30 ENCOUNTER — Encounter (HOSPITAL_COMMUNITY): Payer: Medicare Other

## 2013-07-02 ENCOUNTER — Encounter (HOSPITAL_COMMUNITY): Payer: Medicare Other

## 2013-07-05 ENCOUNTER — Encounter (HOSPITAL_COMMUNITY)
Admission: RE | Admit: 2013-07-05 | Discharge: 2013-07-05 | Disposition: A | Discharge status: Home / Self Care | Payer: Medicare Other | Source: Ambulatory Visit | Attending: Cardiology | Admitting: Cardiology

## 2013-07-07 ENCOUNTER — Encounter (HOSPITAL_COMMUNITY)
Admission: RE | Admit: 2013-07-07 | Discharge: 2013-07-07 | Disposition: A | Discharge status: Home / Self Care | Payer: Medicare Other | Source: Ambulatory Visit | Attending: Cardiology | Admitting: Cardiology

## 2013-07-09 ENCOUNTER — Encounter (HOSPITAL_COMMUNITY)
Admission: RE | Admit: 2013-07-09 | Discharge: 2013-07-09 | Disposition: A | Discharge status: Home / Self Care | Payer: Medicare Other | Source: Ambulatory Visit | Attending: Cardiology | Admitting: Cardiology

## 2013-07-12 ENCOUNTER — Encounter (HOSPITAL_COMMUNITY)
Admission: RE | Admit: 2013-07-12 | Discharge: 2013-07-12 | Disposition: A | Discharge status: Home / Self Care | Payer: Medicare Other | Source: Ambulatory Visit | Attending: Cardiology | Admitting: Cardiology

## 2013-07-14 ENCOUNTER — Encounter (HOSPITAL_COMMUNITY): Payer: Medicare Other

## 2013-07-14 ENCOUNTER — Encounter: Payer: Self-pay | Admitting: Cardiology

## 2013-07-14 ENCOUNTER — Ambulatory Visit (INDEPENDENT_AMBULATORY_CARE_PROVIDER_SITE_OTHER): Payer: Medicare Other | Admitting: Cardiology

## 2013-07-14 VITALS — BP 129/77 | HR 62 | Ht 66.0 in | Wt 182.0 lb

## 2013-07-14 DIAGNOSIS — I2510 Atherosclerotic heart disease of native coronary artery without angina pectoris: Secondary | ICD-10-CM

## 2013-07-14 DIAGNOSIS — I10 Essential (primary) hypertension: Secondary | ICD-10-CM | POA: Diagnosis not present

## 2013-07-14 DIAGNOSIS — I2119 ST elevation (STEMI) myocardial infarction involving other coronary artery of inferior wall: Secondary | ICD-10-CM | POA: Diagnosis not present

## 2013-07-14 NOTE — Progress Notes (Signed)
HPI The patient presents for followup after a acute inferior wall infarct in May. He had thrombectomy of a right coronary artery occlusion. He was initially acute respiratory failure and was cooled. However, he responded very well to therapy.  Since I last saw him he has done well. The patient denies any new symptoms such as chest discomfort, neck or arm discomfort. There has been no new shortness of breath, PND or orthopnea. There have been no reported palpitations, presyncope or syncope.  He has been doing cardiac rehabilitation at [facility].  He did have a followup echo which demonstrated some mild/moderate aortic stenosis. Stress perfusion imaging demonstrated no ischemia in the distribution of nonobstructive LAD lesion.   No Known Allergies  Current Outpatient Prescriptions  Medication Sig Dispense Refill  . aspirin 81 MG tablet Take 1 tablet (81 mg total) by mouth daily.  30 tablet    . atorvastatin (LIPITOR) 80 MG tablet Take 1 tablet (80 mg total) by mouth daily.  30 tablet  11  . Bioflavonoid Products (BIOFLEX) TABS Take 1 tablet by mouth daily.      . Calcium Carbonate-Vitamin D (CALCIUM + D PO) Take 1 tablet by mouth daily.      . Cholecalciferol (VITAMIN D3) 2000 UNITS TABS Take 2,000 Units by mouth daily.      . clopidogrel (PLAVIX) 75 MG tablet Take 1 tablet (75 mg total) by mouth daily.  30 tablet  11  . levothyroxine (SYNTHROID, LEVOTHROID) 75 MCG tablet Take 75 mcg by mouth daily.      . meclizine (ANTIVERT) 12.5 MG tablet Take 12.5-25 mg by mouth 3 (three) times daily as needed for dizziness.      . Multiple Vitamin (MULITIVITAMIN WITH MINERALS) TABS Take 1 tablet by mouth daily.      . nitroGLYCERIN (NITROSTAT) 0.4 MG SL tablet Place 1 tablet (0.4 mg total) under the tongue every 5 (five) minutes as needed for chest pain.  25 tablet  3    Past Medical History  Diagnosis Date  . Hypothyroidism   . Hypertension   . Rectum cancer 1986  . PC (prostate cancer) 1987  . Renal  cell cancer   . Osteoporosis   . Esophageal stricture   . Anemia   . CAD (coronary artery disease)     Inferior MI.  LAD mid 80% stenosis, circ mild plaque, RCA 100% with thrombectomy PTCA.  EF 65%  . CKD (chronic kidney disease)   . Myocardial infarction 03/12/13    Danett Palazzo-cardiologist    Past Surgical History  Procedure Laterality Date  . Kidney surgery  2004  . Radiofrequency ablation kidney  2006  . Hernia repair      2007  . Renal sphincter appliance  2007  . Colonoscopy  01/08/2012    Procedure: COLONOSCOPY;  Surgeon: Malissa Hippo, MD;  [facility];  Service: Endoscopy;  Laterality: N/A;  1030  . Colostomy Left 1986    colon cancer  . Percutaneous coronary intervention-balloon only      ROS:  As stated in the HPI and negative for all other systems.  PHYSICAL EXAM BP 129/77  Pulse 62  Ht 5\' 6"  (1.676 m)  Wt 182 lb (82.555 kg)  BMI 29.39 kg/m2 GENERAL:  Well appearing NECK:  No jugular venous distention, waveform within normal limits, carotid upstroke brisk and symmetric, no bruits, no thyromegaly LYMPHATICS:  No cervical, inguinal adenopathy LUNGS:  Clear to auscultation bilaterally BACK:  No CVA tenderness, mild  lordosis CHEST:  Unremarkable HEART:  PMI not displaced or sustained,S1 and S2 within normal limits, no S3, no S4, no clicks, no rubs, no murmurs ABD:  Flat, positive bowel sounds normal in frequency in pitch, no bruits, no rebound, no guarding, no midline pulsatile mass, no hepatomegaly, no splenomegaly, , colostomy bag in place EXT:  2 plus pulses throughout, no edema, no cyanosis no clubbing  EKG:  Sinus rhythm, rate 52, left axis deviation, first degree AV block, no acute ST-T wave changes. 07/14/2013  ASSESSMENT AND PLAN  CAD: The patient has no new sypmtoms.  No further cardiovascular testing is indicated.  We will continue with aggressive risk reduction and meds as listed.  I did review the perfusion study.  HTN:  He is off of his  Norvasc. I might titrate low-dose beta blocker over time. For now his blood pressure is well controlled.  DYSLIPIDEMIA:  He will continue current Lipitor and couldn't have this followed by Dr. Christell Constant.  AORTIC STENOSIS:  This is moderate and he will be followed clinically. I did review the echo.

## 2013-07-14 NOTE — Patient Instructions (Addendum)
The current medical regimen is effective;  continue present plan and medications.  Follow up with Dr Antoine Poche in May 2015.

## 2013-07-16 ENCOUNTER — Encounter (HOSPITAL_COMMUNITY)
Admission: RE | Admit: 2013-07-16 | Discharge: 2013-07-16 | Disposition: A | Discharge status: Home / Self Care | Payer: Medicare Other | Source: Ambulatory Visit | Attending: Cardiology | Admitting: Cardiology

## 2013-07-19 ENCOUNTER — Ambulatory Visit (INDEPENDENT_AMBULATORY_CARE_PROVIDER_SITE_OTHER): Payer: Medicare Other | Admitting: Family Medicine

## 2013-07-19 ENCOUNTER — Encounter (HOSPITAL_COMMUNITY): Payer: Medicare Other

## 2013-07-19 ENCOUNTER — Encounter: Payer: Self-pay | Admitting: Family Medicine

## 2013-07-19 VITALS — BP 128/81 | HR 60 | Temp 97.5°F | Ht 65.75 in | Wt 178.0 lb

## 2013-07-19 DIAGNOSIS — Z87442 Personal history of urinary calculi: Secondary | ICD-10-CM

## 2013-07-19 DIAGNOSIS — C20 Malignant neoplasm of rectum: Secondary | ICD-10-CM

## 2013-07-19 DIAGNOSIS — E785 Hyperlipidemia, unspecified: Secondary | ICD-10-CM

## 2013-07-19 DIAGNOSIS — C649 Malignant neoplasm of unspecified kidney, except renal pelvis: Secondary | ICD-10-CM

## 2013-07-19 DIAGNOSIS — N183 Chronic kidney disease, stage 3 unspecified: Secondary | ICD-10-CM

## 2013-07-19 DIAGNOSIS — C61 Malignant neoplasm of prostate: Secondary | ICD-10-CM

## 2013-07-19 DIAGNOSIS — M129 Arthropathy, unspecified: Secondary | ICD-10-CM | POA: Diagnosis not present

## 2013-07-19 DIAGNOSIS — I10 Essential (primary) hypertension: Secondary | ICD-10-CM

## 2013-07-19 DIAGNOSIS — M1990 Unspecified osteoarthritis, unspecified site: Secondary | ICD-10-CM

## 2013-07-19 DIAGNOSIS — M810 Age-related osteoporosis without current pathological fracture: Secondary | ICD-10-CM | POA: Diagnosis not present

## 2013-07-19 DIAGNOSIS — Z85528 Personal history of other malignant neoplasm of kidney: Secondary | ICD-10-CM | POA: Insufficient documentation

## 2013-07-19 DIAGNOSIS — C641 Malignant neoplasm of right kidney, except renal pelvis: Secondary | ICD-10-CM

## 2013-07-19 NOTE — Progress Notes (Signed)
Subjective:    Patient ID: Frederick Davis, male    DOB: December 19, 1922, 79 y.o.   MRN: [id number]  HPI Pt here for follow up and management of chronic medical problems. Patient returns to clinic today for followup and evaluation. He is doing remarkably well considering the multitude of problems that he has had. Most recently this was thoroughly myocardial infarction. He seems to be back to his old self and comes today with his wife. He is followed by the cardiologist at [facility]  and the kidney specialist at [facility]. Patient Active Problem List   Diagnosis Date Noted  . Hypotension arterial 03/18/2013  . Complete heart block 03/18/2013  . Anemia   . CKD (chronic kidney disease) stage 3, GFR 30-59 ml/min 03/12/2013  . Acute MI, inferior wall, initial episode of care 03/12/2013  . Impotence 03/12/2013  . History of renal stone 03/12/2013  . Hiatal hernia 03/12/2013  . H/O lithotripsy 03/12/2013  . SCCA (squamous cell carcinoma) of skin 03/12/2013  . S/P prostatectomy, radical 03/12/2013  . Left renal mass 03/12/2013  . Arthritis 03/12/2013  . ST elevation myocardial infarction (STEMI) of inferior wall 03/12/2013  . Coronary atherosclerosis of native coronary artery 03/12/2013  . Cardiogenic shock 03/12/2013  . Acute respiratory failure 03/12/2013  . Altered mental status 03/12/2013  . Essential hypertension, benign 01/14/2013  . Colostomy in place 01/14/2013  . Prostate cancer 01/14/2013  . Renal cell carcinoma, Right 01/14/2013  . Osteoporosis, unspecified 01/14/2013   Outpatient Encounter Prescriptions as of 07/19/2013  Medication Sig Dispense Refill  . aspirin 81 MG tablet Take 1 tablet (81 mg total) by mouth daily.  30 tablet    . atorvastatin (LIPITOR) 80 MG tablet Take 1 tablet (80 mg total) by mouth daily.  30 tablet  11  . Bioflavonoid Products (BIOFLEX) TABS Take 1 tablet by mouth daily.      . Calcium Carbonate-Vitamin D (CALCIUM + D PO) Take 1 tablet by mouth  daily.      . Cholecalciferol (VITAMIN D3) 2000 UNITS TABS Take 2,000 Units by mouth daily.      . clopidogrel (PLAVIX) 75 MG tablet Take 1 tablet (75 mg total) by mouth daily.  30 tablet  11  . levothyroxine (SYNTHROID, LEVOTHROID) 75 MCG tablet Take 75 mcg by mouth daily.      . meclizine (ANTIVERT) 12.5 MG tablet Take 12.5-25 mg by mouth 3 (three) times daily as needed for dizziness.      . Multiple Vitamin (MULITIVITAMIN WITH MINERALS) TABS Take 1 tablet by mouth daily.      . nitroGLYCERIN (NITROSTAT) 0.4 MG SL tablet Place 1 tablet (0.4 mg total) under the tongue every 5 (five) minutes as needed for chest pain.  25 tablet  3   Facility-Administered Encounter Medications as of 07/19/2013  Medication Dose Route Frequency Provider Last Rate Last Dose  . etomidate (AMIDATE) injection    PRN [name], CRNA   10 mg at 03/12/13 1541  . lidocaine (cardiac) 100 mg/34ml (XYLOCAINE) 20 MG/ML injection 2%    PRN [name], CRNA   80 mg at 03/12/13 1541  . succinylcholine (ANECTINE) injection    PRN [name], CRNA   100 mg at 03/12/13 1541        Review of Systems  Constitutional: Negative.   HENT: Negative.   Eyes: Negative.   Respiratory: Negative.   Cardiovascular: Negative.   Gastrointestinal: Negative.   Endocrine: Negative.   Genitourinary: Negative.  Musculoskeletal: Negative.   Skin: Negative.   Allergic/Immunologic: Negative.   Neurological: Negative.   Hematological: Negative.   Psychiatric/Behavioral: Negative.        Objective:   Physical Exam  Nursing note and vitals reviewed. Constitutional: He is oriented to person, place, and time. He appears well-developed and well-nourished. No distress.  HENT:  Head: Normocephalic and atraumatic.  Right Ear: External ear normal.  Left Ear: External ear normal.  Nose: Nose normal.  Mouth/Throat: Oropharynx is clear and moist. No oropharyngeal exudate.  Eyes: Conjunctivae and EOM are normal. Pupils are equal,  round, and reactive to light. Right eye exhibits no discharge. Left eye exhibits no discharge. No scleral icterus.  Neck: Normal range of motion. Neck supple. No tracheal deviation present. No thyromegaly present.  Cardiovascular: Normal rate, regular rhythm and intact distal pulses.  Exam reveals no gallop and no friction rub.   Murmur (there is a gradeover 2/ 6 systolic ejection murmur) heard. At 72 per minute  Pulmonary/Chest: Effort normal and breath sounds normal. No respiratory distress. He has no wheezes. He has no rales. He exhibits no tenderness.  Abdominal: Soft. Bowel sounds are normal. He exhibits no mass. There is no tenderness. There is no rebound and no guarding.  He has an ostomy bag, no sign of irritation. Very active bowel sounds, no bruits  Musculoskeletal: Normal range of motion. He exhibits no edema and no tenderness.  Lymphadenopathy:    He has no cervical adenopathy.  Neurological: He is alert and oriented to person, place, and time. He has normal reflexes. No cranial nerve deficit.  Skin: Skin is warm and dry. No rash noted. No erythema. No pallor.  Psychiatric: He has a normal mood and affect. His behavior is normal. Judgment and thought content normal.   BP 128/81  Pulse 60  Temp(Src) 97.5 F (36.4 C) (Oral)  Ht 5' 5.75" (1.67 m)  Wt 178 lb (80.74 kg)  BMI 28.95 kg/m2        Assessment & Plan:   1. Essential hypertension, benign   2. Osteoporosis, unspecified   3. Arthritis   4. Rectal cancer   5. Prostate cancer, and prostatectomy   6. Renal cell carcinoma, right   7. History of renal stone   8. CKD (chronic kidney disease) stage 3, GFR 30-59 ml/min   9. Hyperlipidemia    Patient Instructions  Continue current medications. Continue good therapeutic lifestyle changes.  Fall precautions discussed with patient. Schedule your flu vaccine and additional pneumonia vaccince the first of October. Follow up as planned and earlier as needed.     Nyra Capes MD

## 2013-07-19 NOTE — Addendum Note (Signed)
Addended by: Magdalene River on: 07/19/2013 10:11 AM   Modules accepted: Orders

## 2013-07-19 NOTE — Patient Instructions (Addendum)
Continue current medications. Continue good therapeutic lifestyle changes.  Fall precautions discussed with patient. Schedule your flu vaccine and additional pneumonia vaccince the first of October. Follow up as planned and earlier as needed.

## 2013-07-21 ENCOUNTER — Encounter (HOSPITAL_COMMUNITY)
Admission: RE | Admit: 2013-07-21 | Discharge: 2013-07-21 | Disposition: A | Discharge status: Home / Self Care | Payer: Medicare Other | Source: Ambulatory Visit | Attending: Cardiology | Admitting: Cardiology

## 2013-07-21 DIAGNOSIS — Z5189 Encounter for other specified aftercare: Secondary | ICD-10-CM | POA: Diagnosis not present

## 2013-07-21 DIAGNOSIS — I252 Old myocardial infarction: Secondary | ICD-10-CM | POA: Insufficient documentation

## 2013-07-23 ENCOUNTER — Encounter (HOSPITAL_COMMUNITY)
Admission: RE | Admit: 2013-07-23 | Discharge: 2013-07-23 | Disposition: A | Discharge status: Home / Self Care | Payer: Medicare Other | Source: Ambulatory Visit | Attending: Cardiology | Admitting: Cardiology

## 2013-07-23 DIAGNOSIS — Z5189 Encounter for other specified aftercare: Secondary | ICD-10-CM | POA: Diagnosis not present

## 2013-07-23 DIAGNOSIS — I252 Old myocardial infarction: Secondary | ICD-10-CM | POA: Diagnosis not present

## 2013-07-26 ENCOUNTER — Encounter: Payer: Self-pay | Admitting: Cardiology

## 2013-07-26 ENCOUNTER — Encounter (HOSPITAL_COMMUNITY)
Admission: RE | Admit: 2013-07-26 | Discharge: 2013-07-26 | Disposition: A | Discharge status: Home / Self Care | Payer: Medicare Other | Source: Ambulatory Visit | Attending: Cardiology | Admitting: Cardiology

## 2013-07-26 DIAGNOSIS — I252 Old myocardial infarction: Secondary | ICD-10-CM | POA: Diagnosis not present

## 2013-07-26 DIAGNOSIS — Z5189 Encounter for other specified aftercare: Secondary | ICD-10-CM | POA: Diagnosis not present

## 2013-07-28 ENCOUNTER — Encounter (HOSPITAL_COMMUNITY)
Admission: RE | Admit: 2013-07-28 | Discharge: 2013-07-28 | Disposition: A | Discharge status: Home / Self Care | Payer: Medicare Other | Source: Ambulatory Visit | Attending: Cardiology | Admitting: Cardiology

## 2013-07-28 DIAGNOSIS — Z5189 Encounter for other specified aftercare: Secondary | ICD-10-CM | POA: Diagnosis not present

## 2013-07-28 DIAGNOSIS — I252 Old myocardial infarction: Secondary | ICD-10-CM | POA: Diagnosis not present

## 2013-07-30 ENCOUNTER — Encounter (HOSPITAL_COMMUNITY)
Admission: RE | Admit: 2013-07-30 | Discharge: 2013-07-30 | Disposition: A | Discharge status: Home / Self Care | Payer: Medicare Other | Source: Ambulatory Visit | Attending: Cardiology | Admitting: Cardiology

## 2013-07-30 DIAGNOSIS — I252 Old myocardial infarction: Secondary | ICD-10-CM | POA: Diagnosis not present

## 2013-07-30 DIAGNOSIS — Z5189 Encounter for other specified aftercare: Secondary | ICD-10-CM | POA: Diagnosis not present

## 2013-08-02 ENCOUNTER — Encounter (HOSPITAL_COMMUNITY)
Admission: RE | Admit: 2013-08-02 | Discharge: 2013-08-02 | Disposition: A | Discharge status: Home / Self Care | Payer: Medicare Other | Source: Ambulatory Visit | Attending: Cardiology | Admitting: Cardiology

## 2013-08-02 DIAGNOSIS — Z5189 Encounter for other specified aftercare: Secondary | ICD-10-CM | POA: Diagnosis not present

## 2013-08-02 DIAGNOSIS — I252 Old myocardial infarction: Secondary | ICD-10-CM | POA: Diagnosis not present

## 2013-08-04 ENCOUNTER — Encounter (HOSPITAL_COMMUNITY)
Admission: RE | Admit: 2013-08-04 | Discharge: 2013-08-04 | Disposition: A | Discharge status: Home / Self Care | Payer: Medicare Other | Source: Ambulatory Visit | Attending: Cardiology | Admitting: Cardiology

## 2013-08-04 DIAGNOSIS — Z5189 Encounter for other specified aftercare: Secondary | ICD-10-CM | POA: Diagnosis not present

## 2013-08-04 DIAGNOSIS — I252 Old myocardial infarction: Secondary | ICD-10-CM | POA: Diagnosis not present

## 2013-08-05 DIAGNOSIS — D485 Neoplasm of uncertain behavior of skin: Secondary | ICD-10-CM | POA: Diagnosis not present

## 2013-08-05 DIAGNOSIS — L570 Actinic keratosis: Secondary | ICD-10-CM | POA: Diagnosis not present

## 2013-08-05 DIAGNOSIS — Z85828 Personal history of other malignant neoplasm of skin: Secondary | ICD-10-CM | POA: Diagnosis not present

## 2013-08-06 ENCOUNTER — Encounter (HOSPITAL_COMMUNITY)
Admission: RE | Admit: 2013-08-06 | Discharge: 2013-08-06 | Disposition: A | Discharge status: Home / Self Care | Payer: Medicare Other | Source: Ambulatory Visit | Attending: Cardiology | Admitting: Cardiology

## 2013-08-06 DIAGNOSIS — Z5189 Encounter for other specified aftercare: Secondary | ICD-10-CM | POA: Diagnosis not present

## 2013-08-06 DIAGNOSIS — I252 Old myocardial infarction: Secondary | ICD-10-CM | POA: Diagnosis not present

## 2013-08-09 ENCOUNTER — Encounter (HOSPITAL_COMMUNITY)
Admission: RE | Admit: 2013-08-09 | Discharge: 2013-08-09 | Disposition: A | Discharge status: Home / Self Care | Payer: Medicare Other | Source: Ambulatory Visit | Attending: Cardiology | Admitting: Cardiology

## 2013-08-09 DIAGNOSIS — I252 Old myocardial infarction: Secondary | ICD-10-CM | POA: Diagnosis not present

## 2013-08-09 DIAGNOSIS — Z5189 Encounter for other specified aftercare: Secondary | ICD-10-CM | POA: Diagnosis not present

## 2013-08-11 ENCOUNTER — Encounter (HOSPITAL_COMMUNITY): Payer: Medicare Other

## 2013-08-12 ENCOUNTER — Ambulatory Visit (INDEPENDENT_AMBULATORY_CARE_PROVIDER_SITE_OTHER): Payer: Medicare Other | Admitting: *Deleted

## 2013-08-12 DIAGNOSIS — Z23 Encounter for immunization: Secondary | ICD-10-CM

## 2013-08-13 ENCOUNTER — Encounter (HOSPITAL_COMMUNITY): Payer: Medicare Other

## 2013-08-16 ENCOUNTER — Encounter (HOSPITAL_COMMUNITY): Payer: Medicare Other

## 2013-08-17 ENCOUNTER — Ambulatory Visit: Payer: Medicare Other

## 2013-08-26 DIAGNOSIS — L570 Actinic keratosis: Secondary | ICD-10-CM | POA: Diagnosis not present

## 2013-08-26 DIAGNOSIS — Z85828 Personal history of other malignant neoplasm of skin: Secondary | ICD-10-CM | POA: Diagnosis not present

## 2013-08-26 DIAGNOSIS — D239 Other benign neoplasm of skin, unspecified: Secondary | ICD-10-CM | POA: Diagnosis not present

## 2013-08-26 DIAGNOSIS — L821 Other seborrheic keratosis: Secondary | ICD-10-CM | POA: Diagnosis not present

## 2013-08-31 ENCOUNTER — Other Ambulatory Visit (INDEPENDENT_AMBULATORY_CARE_PROVIDER_SITE_OTHER): Payer: Medicare Other

## 2013-08-31 DIAGNOSIS — C649 Malignant neoplasm of unspecified kidney, except renal pelvis: Secondary | ICD-10-CM

## 2013-08-31 DIAGNOSIS — Z87442 Personal history of urinary calculi: Secondary | ICD-10-CM

## 2013-08-31 DIAGNOSIS — Z1212 Encounter for screening for malignant neoplasm of rectum: Secondary | ICD-10-CM

## 2013-08-31 DIAGNOSIS — C641 Malignant neoplasm of right kidney, except renal pelvis: Secondary | ICD-10-CM

## 2013-08-31 DIAGNOSIS — M810 Age-related osteoporosis without current pathological fracture: Secondary | ICD-10-CM | POA: Diagnosis not present

## 2013-08-31 DIAGNOSIS — I10 Essential (primary) hypertension: Secondary | ICD-10-CM

## 2013-08-31 DIAGNOSIS — C61 Malignant neoplasm of prostate: Secondary | ICD-10-CM

## 2013-08-31 DIAGNOSIS — C20 Malignant neoplasm of rectum: Secondary | ICD-10-CM

## 2013-08-31 DIAGNOSIS — E785 Hyperlipidemia, unspecified: Secondary | ICD-10-CM

## 2013-08-31 DIAGNOSIS — M129 Arthropathy, unspecified: Secondary | ICD-10-CM

## 2013-08-31 DIAGNOSIS — M1990 Unspecified osteoarthritis, unspecified site: Secondary | ICD-10-CM

## 2013-08-31 LAB — POCT CBC
Granulocyte percent: 73.5 %G (ref 37–80)
Hemoglobin: 11.7 g/dL — AB (ref 14.1–18.1)
MCH, POC: 26.7 pg — AB (ref 27–31.2)
MCV: 81.6 fL (ref 80–97)
MPV: 7.2 fL (ref 0–99.8)
RBC: 4.4 M/uL — AB (ref 4.69–6.13)
WBC: 6.9 10*3/uL (ref 4.6–10.2)

## 2013-08-31 NOTE — Progress Notes (Signed)
Pt came in for labs only 

## 2013-09-02 LAB — NMR, LIPOPROFILE
HDL Cholesterol by NMR: 61 mg/dL (ref >=40)
HDL Particle Number: 40.7 umol/L (ref >=30.5)
LDL Size: 21.2 nm (ref >20.5)
Small LDL Particle Number: 221 nmol/L (ref <=527)
Triglycerides by NMR: 67 mg/dL (ref <150)

## 2013-09-02 LAB — BMP8+EGFR
BUN/Creatinine Ratio: 18 (ref 10–22)
Calcium: 9.8 mg/dL (ref 8.6–10.2)
GFR calc non Af Amer: 37 mL/min/{1.73_m2} — ABNORMAL LOW (ref >59)
Potassium: 4.9 mmol/L (ref 3.5–5.2)
Sodium: 142 mmol/L (ref 134–144)

## 2013-09-02 LAB — FECAL OCCULT BLOOD, IMMUNOCHEMICAL: Fecal Occult Bld: NEGATIVE

## 2013-09-02 LAB — HEPATIC FUNCTION PANEL
ALT: 41 IU/L (ref 0–44)
AST: 28 IU/L (ref 0–40)
Albumin: 4.2 g/dL (ref 3.5–4.7)
Bilirubin, Direct: 0.19 mg/dL (ref 0.00–0.40)
Total Bilirubin: 0.6 mg/dL (ref 0.0–1.2)

## 2013-09-08 ENCOUNTER — Encounter: Payer: Self-pay | Admitting: Pharmacist

## 2013-09-08 ENCOUNTER — Ambulatory Visit (INDEPENDENT_AMBULATORY_CARE_PROVIDER_SITE_OTHER): Payer: Medicare Other | Admitting: Pharmacist

## 2013-09-08 ENCOUNTER — Ambulatory Visit (INDEPENDENT_AMBULATORY_CARE_PROVIDER_SITE_OTHER): Payer: Medicare Other

## 2013-09-08 VITALS — Ht 66.0 in | Wt 175.0 lb

## 2013-09-08 DIAGNOSIS — M810 Age-related osteoporosis without current pathological fracture: Secondary | ICD-10-CM

## 2013-09-08 DIAGNOSIS — M1990 Unspecified osteoarthritis, unspecified site: Secondary | ICD-10-CM

## 2013-09-08 DIAGNOSIS — M129 Arthropathy, unspecified: Secondary | ICD-10-CM

## 2013-09-08 NOTE — Patient Instructions (Signed)

## 2013-09-08 NOTE — Progress Notes (Signed)
Patient ID: Frederick Davis, male   DOB: 11/20/1922, 79 y.o.   MRN: [id number]   Osteoporosis Clinic Current Height: Height: 5\' 6"  (167.6 cm)      Max Lifetime Height:  5\' 10"  Current Weight: Weight: 175 lb (79.379 kg)       Ethnicity:Caucasian    HPI: Does pt already have a diagnosis of:  Osteopenia?  No Osteoporosis?  Yes  Back Pain?  Yes       Kyphosis?  Yes Prior fracture? No Med(s) for Osteoporosis/Osteopenia:  none Med(s) previously tried for Osteoporosis/Osteopenia:  none                                                             PMH: Steroid Use?  No Thyroid med?  Yes History of cancer?  Yes - prostate CA, renal CA and rectal CA History of digestive disorders (ie Crohn's)?  Yes Current or previous eating disorders?  No Last Vitamin D Result:  46 (08/31/2013) Last GFR Result:  37 (08/31/2013)   FH/SH: Family history of osteoporosis?  No Parent with history of hip fracture?  No Family history of breast cancer?  No Exercise?  Yes  - exercises 3 times per week Smoking?  No Alcohol?  No    Calcium Assessment Calcium Intake  # of servings/day  Calcium mg  Milk (8 oz) 1  x  300  = 300mg   Yogurt (4 oz) 0 x  200 = 0  Cheese (1 oz) 0 x  200 = 0  Other Calcium sources   250mg   Ca supplement MVI and calcium = 1000mg    Estimated calcium intake per day 1550mg     DEXA Results Date of Test T-Score for AP Spine L1-L4 T-Score for Total Left Hip T-Score for Total Right Hip  09/08/2013 0.7 -2.2 -2.3  09/14/2008 1.2 -1.5 -1.5            TScore for neck of left femur was -2.9 TScore for neck of right femur was -2.8  Assessment: Osteoporosis with history of low testosterone and multiple cancers   Recommendations: 1.  Discussed treatment options - not a candidate for testosterone replacement due to history of prostate cancer, also not a good candidate for bisphosphonate due to decreased renal function and history of renal cancer.  Also prolia and forteo are not ideal for  patients with as extensive a history cancer as patient and he refuses treatment.  2.  continue calcium 1200mg  daily through supplementation or diet.  3.  continue weight bearing exercise - 30 minutes at least 4 days  per week.   4.  Counseled and educated about fall risk and prevention.  Recheck DEXA:  2 years  Time spent counseling patient:  15 minutes

## 2013-09-13 ENCOUNTER — Other Ambulatory Visit: Payer: Self-pay | Admitting: Family Medicine

## 2013-10-19 ENCOUNTER — Encounter: Payer: Self-pay | Admitting: Family Medicine

## 2013-10-19 ENCOUNTER — Ambulatory Visit (INDEPENDENT_AMBULATORY_CARE_PROVIDER_SITE_OTHER): Payer: Medicare Other

## 2013-10-19 ENCOUNTER — Ambulatory Visit (INDEPENDENT_AMBULATORY_CARE_PROVIDER_SITE_OTHER): Payer: Medicare Other | Admitting: Family Medicine

## 2013-10-19 VITALS — BP 130/74 | HR 80 | Temp 99.5°F | Ht 66.0 in | Wt 174.0 lb

## 2013-10-19 DIAGNOSIS — J209 Acute bronchitis, unspecified: Secondary | ICD-10-CM

## 2013-10-19 DIAGNOSIS — R05 Cough: Secondary | ICD-10-CM

## 2013-10-19 DIAGNOSIS — R6889 Other general symptoms and signs: Secondary | ICD-10-CM | POA: Diagnosis not present

## 2013-10-19 MED ORDER — OSELTAMIVIR PHOSPHATE 75 MG PO CAPS: 75.0000 mg | ORAL_CAPSULE | Freq: Two times a day (BID) | ORAL | Status: DC

## 2013-10-19 MED ORDER — AZITHROMYCIN 250 MG PO TABS: ORAL_TABLET | ORAL | Status: DC

## 2013-10-19 NOTE — Patient Instructions (Signed)
Take Mucinex maximum strength blue and white in color over-the-counter one twice daily with a large glass of water Use a cool mist humidifier Drink plenty of fluid  Take Tylenol for aches pains and fever Take antibiotic and flu medication as directed

## 2013-10-19 NOTE — Progress Notes (Signed)
Subjective:    Patient ID: Frederick Davis, male    DOB: 03-30-1923, 79 y.o.   MRN: [id number]  HPI Patient here today for cough and fever that started yesterday. Patient is accompanied today by his wife. Patient comes in today for cough and congestion of 24-hour duration. The sputum has been scant. He has had chills at home. He is a heart patient.     Patient Active Problem List   Diagnosis Date Noted  . Rectal cancer 07/19/2013  . Hyperlipidemia 07/19/2013  . Hypotension arterial 03/18/2013  . Complete heart block 03/18/2013  . Anemia   . CKD (chronic kidney disease) stage 3, GFR 30-59 ml/min 03/12/2013  . Acute MI, inferior wall, initial episode of care 03/12/2013  . Impotence 03/12/2013  . History of renal stone 03/12/2013  . Hiatal hernia 03/12/2013  . H/O lithotripsy 03/12/2013  . SCCA (squamous cell carcinoma) of skin 03/12/2013  . S/P prostatectomy, radical 03/12/2013  . Left renal mass 03/12/2013  . Arthritis 03/12/2013  . ST elevation myocardial infarction (STEMI) of inferior wall 03/12/2013  . Coronary atherosclerosis of native coronary artery 03/12/2013  . Cardiogenic shock 03/12/2013  . Acute respiratory failure 03/12/2013  . Altered mental status 03/12/2013  . Essential hypertension, benign 01/14/2013  . Colostomy in place 01/14/2013  . Prostate cancer 01/14/2013  . Renal cell carcinoma, Right 01/14/2013  . Osteoporosis, unspecified 01/14/2013   Outpatient Encounter Prescriptions as of 10/19/2013  Medication Sig  . aspirin 81 MG tablet Take 1 tablet (81 mg total) by mouth daily.  Marland Kitchen atorvastatin (LIPITOR) 80 MG tablet Take 1 tablet (80 mg total) by mouth daily.  Marland Kitchen Bioflavonoid Products (BIOFLEX) TABS Take 1 tablet by mouth daily.  . Calcium Carbonate-Vitamin D (CALCIUM + D PO) Take 1 tablet by mouth daily.  . Cholecalciferol (VITAMIN D3) 2000 UNITS TABS Take 2,000 Units by mouth daily.  . clopidogrel (PLAVIX) 75 MG tablet Take 1 tablet (75 mg total) by  mouth daily.  Marland Kitchen levothyroxine (SYNTHROID, LEVOTHROID) 75 MCG tablet TAKE 1 TABLET DAILY  . meclizine (ANTIVERT) 12.5 MG tablet Take 12.5-25 mg by mouth 3 (three) times daily as needed for dizziness.  . Multiple Vitamin (MULITIVITAMIN WITH MINERALS) TABS Take 1 tablet by mouth daily.  . nitroGLYCERIN (NITROSTAT) 0.4 MG SL tablet Place 1 tablet (0.4 mg total) under the tongue every 5 (five) minutes as needed for chest pain.    Review of Systems  Constitutional: Positive for fever.  HENT: Positive for congestion.   Eyes: Negative.   Respiratory: Positive for cough.   Cardiovascular: Negative.   Gastrointestinal: Negative.   Endocrine: Negative.   Genitourinary: Negative.   Musculoskeletal: Negative.   Skin: Negative.   Allergic/Immunologic: Negative.   Neurological: Negative.   Hematological: Negative.   Psychiatric/Behavioral: Negative.        Objective:   Physical Exam  Nursing note and vitals reviewed. Constitutional: He is oriented to person, place, and time. He appears well-developed and well-nourished. No distress.  HENT:  Head: Normocephalic and atraumatic.  Right Ear: External ear normal.  Left Ear: External ear normal.  Mouth/Throat: Oropharynx is clear and moist. No oropharyngeal exudate.  Some nasal congestion  Eyes: Conjunctivae and EOM are normal. Pupils are equal, round, and reactive to light. Right eye exhibits no discharge. Left eye exhibits no discharge. No scleral icterus.  Neck: Normal range of motion. Neck supple. No thyromegaly present.  Cardiovascular: Normal rate and normal heart sounds.  Exam reveals no gallop and no friction  rub.   No murmur heard. Slightly irregular at 72 per minute  Pulmonary/Chest: Effort normal and breath sounds normal. No respiratory distress. He has no wheezes. He has no rales.  Dry cough  Abdominal: Soft. Bowel sounds are normal. He exhibits no mass. There is no tenderness. There is no rebound and no guarding.  Musculoskeletal:  Normal range of motion.  Lymphadenopathy:    He has no cervical adenopathy.  Neurological: He is alert and oriented to person, place, and time.  Skin: Skin is warm and dry. No rash noted. No erythema. No pallor.  Psychiatric: He has a normal mood and affect. His behavior is normal. Judgment and thought content normal.   BP 130/74  Pulse 80  Temp(Src) 99.5 F (37.5 C) (Oral)  Ht 5\' 6"  (1.676 m)  Wt 174 lb (78.926 kg)  BMI 28.10 kg/m2  [facility] reading (PRIMARY) by  Dr Christell Constant--- chest x-ray no acute lung findings. Heart slightly enlarged. Large hiatal hernia.                                      Assessment & Plan:  1. Cough - DG Chest 2 View; Future - CBC With differential/Platelet  2. Acute bronchitis  3. Flu-like symptoms  Meds ordered this encounter  Medications  . azithromycin (ZITHROMAX) 250 MG tablet    Sig: As directed    Dispense:  6 tablet    Refill:  0  . oseltamivir (TAMIFLU) 75 MG capsule    Sig: Take 1 capsule (75 mg total) by mouth 2 (two) times daily.    Dispense:  10 capsule    Refill:  0   Patient Instructions  Take Mucinex maximum strength blue and white in color over-the-counter one twice daily with a large glass of water Use a cool mist humidifier Drink plenty of fluid  Take Tylenol for aches pains and fever Take antibiotic and flu medication as directed   Nyra Capes MD

## 2013-10-20 LAB — CBC WITH DIFFERENTIAL
Basophils Absolute: 0.1 10*3/uL (ref 0.0–0.2)
Basos: 1 %
Eosinophils Absolute: 0.1 10*3/uL (ref 0.0–0.4)
HCT: 37 % — ABNORMAL LOW (ref 37.5–51.0)
Hemoglobin: 11.9 g/dL — ABNORMAL LOW (ref 12.6–17.7)
Lymphocytes Absolute: 1.2 10*3/uL (ref 0.7–3.1)
MCH: 26.9 pg (ref 26.6–33.0)
MCHC: 32.2 g/dL (ref 31.5–35.7)
MCV: 84 fL (ref 79–97)
Monocytes Absolute: 0.9 10*3/uL (ref 0.1–0.9)
Neutrophils Absolute: 5.9 10*3/uL (ref 1.4–7.0)
RDW: 15.1 % (ref 12.3–15.4)

## 2013-11-18 DIAGNOSIS — H26499 Other secondary cataract, unspecified eye: Secondary | ICD-10-CM | POA: Diagnosis not present

## 2013-11-18 DIAGNOSIS — H02409 Unspecified ptosis of unspecified eyelid: Secondary | ICD-10-CM | POA: Diagnosis not present

## 2013-11-23 NOTE — Addendum Note (Signed)
Encounter addended by: Norlene Duel, RN on: 11/23/2013 10:31 AM<BR>     Documentation filed: Notes Section

## 2013-11-23 NOTE — Progress Notes (Signed)
Cardiac Rehabilitation Program Outcomes Report   Orientation:  04/29/2013 1st week Report:05/07/2013 Graduate Date:  tbd Discharge Date:  tbd # of sessions completed: 3  Cardiologist: Hochrein Family MD:  Morrie Sheldon Class Time:  08:15  A.  Exercise Program:  Tolerates exercise @ 3.79 METS for 15 minutes and Walk Test Results:  Pre: Pre walk Test: Resting HR 54, BP 140/88, O2 99%, RPE 7 and RPD 7, 6 minuteHR 80, BP 162/100, O2 87% RPE 11 and RPD 8,, Post HR 54, BP 142/82 O2 100% RPE 7 and RPD 8 . Walked 1100 feet at 2.0 mph at 2.6 METS.  B.  Mental Health:  Good mental attitude  C.  Education/Instruction/Skills  Accurately checks own pulse.  Rest: 83  Exercise:  100  Uses Perceived Exertion Scale and/or Dyspnea Scale  D.  Nutrition/Weight Control/Body Composition:  Adherence to prescribed nutrition program: good    E.  Blood Lipids    Lab Results  Component Value Date   CHOL 104 08/31/2013   HDL 63 03/13/2013   LDLCALC 29 05/27/2013   TRIG 76 05/27/2013   CHOLHDL 2.0 03/13/2013    F.  Lifestyle Changes:  Making positive lifestyle changes and Not smoking:  Quit 1970  G.  Symptoms noted with exercise:  Asymptomatic  Report Completed By:  Oletta Lamas. Geran Haithcock RN   Comments:  This is patients 1 st week report. He has done well his first week. He achieved a peak METS of 3.79. His resting HR was 83 and resting BP was 120/68 and peak HR was 100 and peak BP was 136/70. A report will follow upon his 18 th session, his halfway point

## 2013-11-23 NOTE — Progress Notes (Signed)
Cardiac Rehabilitation Program Outcomes Report   Orientation:  04/29/2013 Halfway report: 06/14/2013 Graduate Date:  tbd Discharge Date:  tbd # of sessions completed: 18 DX: Acute MI and balloon Angioplasty  Cardiologist: Percival Spanish Family MD:  Morrie Sheldon Class Time:  08:15  A.  Exercise Program:  Tolerates exercise @ 2.60 METS for 15 minutes  B.  Mental Health:  Good mental attitude  C.  Education/Instruction/Skills  Accurately checks own pulse.  Rest:  65  Exercise: 100  Uses Perceived Exertion Scale and/or Dyspnea Scale  D.  Nutrition/Weight Control/Body Composition:  Adherence to prescribed nutrition program: good    E.  Blood Lipids    Lab Results  Component Value Date   CHOL 104 08/31/2013   HDL 63 03/13/2013   LDLCALC 29 05/27/2013   TRIG 76 05/27/2013   CHOLHDL 2.0 03/13/2013    F.  Lifestyle Changes:  Making positive lifestyle changes and Not smoking:  Quit 1970  G.  Symptoms noted with exercise:  Asymptomatic  Report Completed By:  Oletta Lamas. Korbin Notaro RN   Comments:  This is patients halfway report. He has done well so far in Rehab He achieved a peak METS of 2.60. His resting HR was 65 and his resting BP was 130/70. His peak Hr was 100 and peak BP was 138/80. A graduation report will follow upon the patients 33 th visit.

## 2013-11-23 NOTE — Addendum Note (Signed)
Encounter addended by: Norlene Duel, RN on: 11/23/2013 11:04 AM<BR>     Documentation filed: Clinical Notes

## 2013-11-23 NOTE — Addendum Note (Signed)
Encounter addended by: Norlene Duel, RN on: 11/23/2013 10:40 AM<BR>     Documentation filed: Clinical Notes

## 2013-11-23 NOTE — Addendum Note (Signed)
Encounter addended by: Norlene Duel, RN on: 11/23/2013 10:40 AM<BR>     Documentation filed: Notes Section

## 2013-11-23 NOTE — Addendum Note (Signed)
Encounter addended by: Norlene Duel, RN on: 11/23/2013 10:32 AM<BR>     Documentation filed: Clinical Notes

## 2013-11-23 NOTE — Addendum Note (Signed)
Encounter addended by: Norlene Duel, RN on: 11/23/2013 11:03 AM<BR>     Documentation filed: Notes Section

## 2013-11-23 NOTE — Progress Notes (Signed)
Cardiac Rehabilitation Program Outcomes Report   Orientation: 07/10/ 2014 Graduate Date:  08/09/2013 Discharge Date:  08/09/2013 # of sessions completed: 36 DX: Acute MI and balloon Angioplasty   Cardiologist: Percival Spanish Family MD:  Morrie Sheldon Class Time:  08:15  A.  Exercise Program:  Tolerates exercise @ 3.79 METS for 15 minutes, Walk Test Results:  Post: Post Walk Test: Resting Hr 81, BP 122/78, O2 97% RPE 6 and RPD 6. 6 minute HR 104, BP 170.90, O2 97% RPE 10, and RPD 11, Post HR 71. BP 128/70, O2 98%, RPE6 and RPD 6. Walked 1300 feet at 2.46 MPH at 2.89 METS and Discharged to home exercise program.  Anticipated compliance:  excellent  B.  Mental Health:  Good mental attitude  C.  Education/Instruction/Skills  Accurately checks own pulse.  Rest:  68  Exercise:  121, Knows THR for exercise, Uses Perceived Exertion Scale and/or Dyspnea Scale and Attended 13 education classes  Uses Perceived Exertion Scale and/or Dyspnea Scale  D.  Nutrition/Weight Control/Body Composition:  Adherence to prescribed nutrition program: good    E.  Blood Lipids    Lab Results  Component Value Date   CHOL 104 08/31/2013   HDL 63 03/13/2013   LDLCALC 29 05/27/2013   TRIG 76 05/27/2013   CHOLHDL 2.0 03/13/2013    F.  Lifestyle Changes:  Making positive lifestyle changes and Not smoking:  Quit 1970  G.  Symptoms noted with exercise:  Asymptomatic  Report Completed By:  Oletta Lamas. Makinlee Awwad RN   Comments:  This is patients graduation report. He has done very well while in Rehab. He achieved a peak METS of 3.79. His resting HR was 68 and resting BP was 120/70, His peak Hr was 121 and peak BP was 138/60. He completed all 36 sessions. A F/U call will be made to patient at 1 month, 6 month and 1 year post graduation.

## 2013-12-06 ENCOUNTER — Ambulatory Visit (INDEPENDENT_AMBULATORY_CARE_PROVIDER_SITE_OTHER): Payer: Medicare Other | Admitting: Family Medicine

## 2013-12-06 ENCOUNTER — Encounter: Payer: Self-pay | Admitting: Family Medicine

## 2013-12-06 VITALS — BP 129/78 | HR 75 | Temp 96.3°F | Ht 65.75 in | Wt 171.2 lb

## 2013-12-06 DIAGNOSIS — I10 Essential (primary) hypertension: Secondary | ICD-10-CM | POA: Diagnosis not present

## 2013-12-06 DIAGNOSIS — N1832 Chronic kidney disease, stage 3b: Secondary | ICD-10-CM

## 2013-12-06 DIAGNOSIS — E785 Hyperlipidemia, unspecified: Secondary | ICD-10-CM

## 2013-12-06 DIAGNOSIS — I2510 Atherosclerotic heart disease of native coronary artery without angina pectoris: Secondary | ICD-10-CM

## 2013-12-06 DIAGNOSIS — M1990 Unspecified osteoarthritis, unspecified site: Secondary | ICD-10-CM

## 2013-12-06 DIAGNOSIS — K449 Diaphragmatic hernia without obstruction or gangrene: Secondary | ICD-10-CM

## 2013-12-06 DIAGNOSIS — N183 Chronic kidney disease, stage 3 unspecified: Secondary | ICD-10-CM | POA: Diagnosis not present

## 2013-12-06 DIAGNOSIS — E559 Vitamin D deficiency, unspecified: Secondary | ICD-10-CM

## 2013-12-06 DIAGNOSIS — M129 Arthropathy, unspecified: Secondary | ICD-10-CM

## 2013-12-06 LAB — POCT CBC
Granulocyte percent: 71.2 %G (ref 37–80)
HCT, POC: 12 % — AB (ref 43.5–53.7)
HEMOGLOBIN: 12 g/dL — AB (ref 14.1–18.1)
LYMPH, POC: 1.4 (ref 0.6–3.4)
MCH: 26.3 pg — AB (ref 27–31.2)
MCHC: 31 g/dL — AB (ref 31.8–35.4)
MCV: 84.8 fL (ref 80–97)
MPV: 7.5 fL (ref 0–99.8)
PLATELET COUNT, POC: 223 10*3/uL (ref 142–424)
POC Granulocyte: 4.4 (ref 2–6.9)
POC LYMPH %: 22.2 % (ref 10–50)
RBC: 4.6 M/uL — AB (ref 4.69–6.13)
RDW, POC: 15.3 %
WBC: 6.2 10*3/uL (ref 4.6–10.2)

## 2013-12-06 NOTE — Patient Instructions (Signed)
Continue current medications Do not climb and do not do it yourself at risk for falling Keep appointment with cardiologist in April Continue to keep followup at [facility] Use saline nose spray    A[place]-- Debrox eardrops over-the-counter will help and softening the ear wax. Use these drops in each ear for 2-3 nights in a row, wait one  week and repeat this sequence

## 2013-12-06 NOTE — Progress Notes (Signed)
Subjective:    Patient ID: Frederick Davis, male    DOB: 1923-02-07, 80 y.o.   MRN: [id number]  HPI The patient comes in today for followup and management of chronic medical problems. His main complaints are arthralgias in his hands. He comes to the visit today with his wife. Patient is followed at [facility] for his renal mass and history of prostate cancer. He does have a colostomy in place. He is in good spirits and no particular problems.    Patient Active Problem List   Diagnosis Date Noted  . Rectal cancer 07/19/2013  . Hyperlipidemia 07/19/2013  . Hypotension arterial 03/18/2013  . Complete heart block 03/18/2013  . Anemia   . CKD (chronic kidney disease) stage 3, GFR 30-59 ml/min 03/12/2013  . Acute MI, inferior wall, initial episode of care 03/12/2013  . Impotence 03/12/2013  . History of renal stone 03/12/2013  . Hiatal hernia 03/12/2013  . H/O lithotripsy 03/12/2013  . SCCA (squamous cell carcinoma) of skin 03/12/2013  . S/P prostatectomy, radical 03/12/2013  . Left renal mass 03/12/2013  . Arthritis 03/12/2013  . ST elevation myocardial infarction (STEMI) of inferior wall 03/12/2013  . Coronary atherosclerosis of native coronary artery 03/12/2013  . Cardiogenic shock 03/12/2013  . Acute respiratory failure 03/12/2013  . Altered mental status 03/12/2013  . Essential hypertension, benign 01/14/2013  . Colostomy in place 01/14/2013  . Prostate cancer 01/14/2013  . Renal cell carcinoma, Right 01/14/2013  . Osteoporosis, unspecified 01/14/2013   Outpatient Encounter Prescriptions as of 12/06/2013  Medication Sig  . aspirin 81 MG tablet Take 1 tablet (81 mg total) by mouth daily.  Marland Kitchen atorvastatin (LIPITOR) 80 MG tablet Take 1 tablet (80 mg total) by mouth daily.  Marland Kitchen Bioflavonoid Products (BIOFLEX) TABS Take 1 tablet by mouth daily.  . Calcium Carbonate-Vitamin D (CALCIUM + D PO) Take 1 tablet by mouth daily.  . Cholecalciferol (VITAMIN D3) 2000 UNITS TABS  Take 2,000 Units by mouth daily.  . clopidogrel (PLAVIX) 75 MG tablet Take 1 tablet (75 mg total) by mouth daily.  Marland Kitchen levothyroxine (SYNTHROID, LEVOTHROID) 75 MCG tablet TAKE 1 TABLET DAILY  . Multiple Vitamin (MULITIVITAMIN WITH MINERALS) TABS Take 1 tablet by mouth daily.  . nitroGLYCERIN (NITROSTAT) 0.4 MG SL tablet Place 1 tablet (0.4 mg total) under the tongue every 5 (five) minutes as needed for chest pain.  . [DISCONTINUED] azithromycin (ZITHROMAX) 250 MG tablet As directed  . [DISCONTINUED] meclizine (ANTIVERT) 12.5 MG tablet Take 12.5-25 mg by mouth 3 (three) times daily as needed for dizziness.  . [DISCONTINUED] oseltamivir (TAMIFLU) 75 MG capsule Take 1 capsule (75 mg total) by mouth 2 (two) times daily.     Review of Systems  Constitutional: Negative.  Negative for appetite change and fatigue.  HENT: Negative.  Negative for rhinorrhea, sore throat and tinnitus.   Eyes: Negative.  Negative for redness and itching.  Respiratory: Negative for cough and shortness of breath.   Cardiovascular: Negative.   Gastrointestinal: Negative.  Negative for abdominal pain.  Endocrine: Negative.   Musculoskeletal: Positive for arthralgias (bilateral hands).  Neurological: Negative for dizziness and headaches.  Psychiatric/Behavioral: Negative for sleep disturbance. The patient is not nervous/anxious.        Objective:   Physical Exam  Nursing note and vitals reviewed. Constitutional: He is oriented to person, place, and time. He appears well-developed and well-nourished. No distress.  Pleasant and smiling and appears younger than his stated age  HENT:  Head: Normocephalic and  atraumatic.  Right Ear: External ear normal.  Left Ear: External ear normal.  Nose: Nose normal.  Mouth/Throat: Oropharynx is clear and moist. No oropharyngeal exudate.  Nasal congestion bilaterally  Eyes: Conjunctivae and EOM are normal. Pupils are equal, round, and reactive to light. Right eye exhibits no  discharge. Left eye exhibits no discharge. No scleral icterus.  Neck: Normal range of motion. Neck supple. No tracheal deviation present. No thyromegaly present.  No carotid bruits  Cardiovascular: Normal rate, regular rhythm and intact distal pulses.  Exam reveals no gallop and no friction rub.   Murmur (grade 2/6 systolic ejection murmur) heard. Pulmonary/Chest: Effort normal and breath sounds normal. No respiratory distress. He has no wheezes. He has no rales. He exhibits no tenderness.  No axillary adenopathy  Abdominal: Soft. Bowel sounds are normal. He exhibits no mass. There is no tenderness. There is no rebound and no guarding.  Colostomy bag in place secondary to rectal carcinoma  Musculoskeletal: Normal range of motion. He exhibits no edema.  Lymphadenopathy:    He has no cervical adenopathy.  Neurological: He is alert and oriented to person, place, and time. He has normal reflexes. No cranial nerve deficit.  Skin: Skin is warm and dry. No rash noted. No erythema. No pallor.  Psychiatric: He has a normal mood and affect. His behavior is normal. Judgment and thought content normal.   BP 129/78  Pulse 75  Temp(Src) 96.3 F (35.7 C) (Oral)  Ht 5' 5.75" (1.67 m)  Wt 171 lb 3.2 oz (77.656 kg)  BMI 27.84 kg/m2        Assessment & Plan:  1. Hyperlipemia - NMR, lipoprofile - Hepatic function panel  2. Hypertension - POCT CBC  3. CKD stage G3b/A1, GFR 30 - 44 and albumin creatinine ratio <30 mg/g - BMP8+EGFR  4. Vitamin D deficiency - Vit D  25 hydroxy (rtn osteoporosis monitoring)  5. Essential hypertension, benign  6. CKD (chronic kidney disease) stage 3, GFR 30-59 ml/min  7. Hiatal hernia  8. Coronary atherosclerosis of native coronary artery  9. Arthritis  10. Hyperlipidemia Patient Instructions  Continue current medications Do not climb and do not do it yourself at risk for falling Keep appointment with cardiologist in April Continue to keep followup at  [facility] Use saline nose spray    Ayr-- Debrox eardrops over-the-counter will help and softening the ear wax. Use these drops in each ear for 2-3 nights in a row, wait one  week and repeat this sequence   Arrie Senate MD

## 2013-12-10 LAB — BMP8+EGFR
BUN/Creatinine Ratio: 16 (ref 10–22)
BUN: 27 mg/dL (ref 8–27)
CALCIUM: 10.4 mg/dL — AB (ref 8.6–10.2)
CO2: 22 mmol/L (ref 18–29)
Chloride: 103 mmol/L (ref 97–108)
Creatinine, Ser: 1.67 mg/dL — ABNORMAL HIGH (ref 0.76–1.27)
GFR calc Af Amer: 41 mL/min/{1.73_m2} — ABNORMAL LOW (ref >59)
GFR, EST NON AFRICAN AMERICAN: 36 mL/min/{1.73_m2} — AB (ref >59)
Glucose: 90 mg/dL (ref 65–99)
POTASSIUM: 5.1 mmol/L (ref 3.5–5.2)
SODIUM: 142 mmol/L (ref 134–144)

## 2013-12-10 LAB — NMR, LIPOPROFILE
Cholesterol: 110 mg/dL (ref <200)
HDL Cholesterol by NMR: 61 mg/dL (ref >=40)
HDL PARTICLE NUMBER: 40 umol/L (ref >=30.5)
LDL Particle Number: <300 nmol/L (ref <1000)
LDLC SERPL CALC-MCNC: 35 mg/dL (ref <100)
LP-IR Score: <25 (ref <=45)
TRIGLYCERIDES BY NMR: 72 mg/dL (ref <150)

## 2013-12-10 LAB — HEPATIC FUNCTION PANEL
ALK PHOS: 81 IU/L (ref 39–117)
ALT: 26 IU/L (ref 0–44)
AST: 28 IU/L (ref 0–40)
Albumin: 4.3 g/dL (ref 3.5–4.7)
Bilirubin, Direct: 0.2 mg/dL (ref 0.00–0.40)
TOTAL PROTEIN: 6.5 g/dL (ref 6.0–8.5)
Total Bilirubin: 0.5 mg/dL (ref 0.0–1.2)

## 2013-12-10 LAB — VITAMIN D 25 HYDROXY (VIT D DEFICIENCY, FRACTURES): Vit D, 25-Hydroxy: 49.7 ng/mL (ref 30.0–100.0)

## 2013-12-13 ENCOUNTER — Telehealth: Payer: Self-pay | Admitting: Family Medicine

## 2013-12-13 NOTE — Telephone Encounter (Signed)
Message copied by Waverly Ferrari on Mon Dec 13, 2013 11:36 AM ------      Message from: Chipper Herb      Created: Sat Dec 11, 2013  8:47 AM       The blood sugar is good at 90. The creatinine remains elevated at 1.67 this is consistent with past readings. The electrolytes are good except the calcium level is slightly elevated at 10.4 and this has been elevated in the past.      All cholesterol numbers by advanced lipid testing are excellent and within normal limits----continue current treatment      The vitamin D level is excellent and within normal limits, continue current treatment      All liver function tests are within normal limit ------

## 2013-12-24 ENCOUNTER — Ambulatory Visit: Payer: Medicare Other

## 2013-12-27 ENCOUNTER — Ambulatory Visit: Payer: Medicare Other

## 2013-12-28 ENCOUNTER — Ambulatory Visit (INDEPENDENT_AMBULATORY_CARE_PROVIDER_SITE_OTHER): Payer: Medicare Other | Admitting: *Deleted

## 2013-12-28 DIAGNOSIS — Z23 Encounter for immunization: Secondary | ICD-10-CM

## 2013-12-28 NOTE — Progress Notes (Signed)
Patient tolerated well.

## 2013-12-28 NOTE — Patient Instructions (Signed)
Pneumococcal Conjugate Vaccine  What You Need to Know  Your doctor recommends that you, or your child, get a dose of PCV13 vaccine today.  WHY GET VACCINATED?  Pneumococcal conjugate vaccine (called PCV13 or Prevnar 13) is recommended to protect infants and toddlers, and some older children and adults with certain health conditions, from pneumococcal disease.  Pneumococcal disease is caused by infection with Streptococcus pneumoniae bacteria. These bacteria can spread from person to person through close contact.  Pneumococcal disease can lead to severe health problems, including pneumonia, blood infections, and meningitis.  Meningitis is an infection of the covering of the brain. Pneumococcal meningitis is fairly rare (less than 1 case per 100,000 people each year), but it leads to other health problems, including deafness and brain damage. In children, it is fatal in about 1 case out of 10.  Children younger than two are at higher risk for serious disease than older children.  People with certain medical conditions, people over age 65, and cigarette smokers are also at higher risk.  Before vaccine, pneumococcal infections caused many problems each year in the [place] in children younger than 5, including:  · more than 700 cases of meningitis,  · 13,000 blood infections,  · about 5 million ear infections, and  · about 200 deaths.  About 4,000 adults still die each year because of pneumococcal infections.  Pneumococcal infections can be hard to treat because some strains are resistant to antibiotics. This makes prevention through vaccination even more important.  PCV13 VACCINE  There are more than 90 types of pneumococcal bacteria. PCV13 protects against 13 of them. These 13 strains cause most severe infections in children and about half of infections in adults.   PCV13 is routinely given to children at 2, 4, 6, and 12 15 months of age. Children in this age range are at greatest risk for serious diseases caused  by pneumococcal infection.  PCV13 vaccine may also be recommended for some older children or adults. Your doctor can give you details.  A second type of pneumococcal vaccine, called PPSV23, may also be given to some children and adults, including anyone over age 65. There is a separate Vaccine Information Statement for this vaccine.  PRECAUTIONS   Anyone who has ever had a life-threatening allergic reaction to a dose of this vaccine, to an earlier pneumococcal vaccine called PCV7 (or Prevnar), or to any vaccine containing diphtheria toxoid (for example, DTaP), should not get PCV13.  Anyone with a severe allergy to any component of PCV13 should not get the vaccine. Tell your doctor if the person being vaccinated has any severe allergies.  If the person scheduled for vaccination is sick, your doctor might decide to reschedule the shot on another day.  Your doctor can give you more information about any of these precautions.  RISKS   With any medicine, including vaccines, there is a chance of side effects. These are usually mild and go away on their own, but serious reactions are also possible.  Reported problems associated with PCV13 vary by dose and age, but generally:  · About half of children became drowsy after the shot, had a temporary loss of appetite, or had redness or tenderness where the shot was given.  · About 1 out of 3 had swelling where the shot was given.  · About 1 out of 3 had a mild fever, and about 1 in 20 had a higher fever (over 102.2° F or 39° C).  · Up to about   8 out of 10 became fussy or irritable.  Adults receiving the vaccine have reported redness, pain, and swelling where the shot was given. Mild fever, fatigue, headache, chills, or muscle pain have also been reported.  Life-threatening allergic reactions from any vaccine are very rare.  WHAT IF THERE IS A SERIOUS REACTION?  What should I look for?  Look for anything that concerns you, such as signs of a severe allergic reaction, very high  fever, or behavior changes.  Signs of a severe allergic reaction can include hives, swelling of the face and throat, difficulty breathing, a fast heartbeat, dizziness, and weakness. These would start a few minutes to a few hours after the vaccination.  What should I do?  · If you think it is a severe allergic reaction or other emergency that can't wait, get the person to the nearest hospital or call 9-1-1. Otherwise, call your doctor.  · Afterward, the reaction should be reported to the "Vaccine Adverse Event Reporting System" (VAERS). Your doctor might file this report, or you can do it yourself through the VAERS web site at www.vaers.hhs.gov, or by calling 1-800-822-7967.  VAERS is only for reporting reactions. They do not give medical advice.  THE NATIONAL VACCINE INJURY COMPENSATION PROGRAM  The National Vaccine Injury Compensation Program (VICP) was created in 1986.  Persons who believe they may have been injured by a vaccine can learn about the program and about filing a claim by calling 1-800-338-2382 or visiting the VICP website at www.hrsa.gov/vaccinecompensation.  HOW CAN I LEARN MORE?  · Ask your doctor.  · Call your local or state health department.  · Contact the Centers for Disease Control and Prevention (CDC):  · Call 1-800-232-4636 (1-800-CDC-INFO) or  · Visit CDC's website at www.cdc.gov/vaccines  CDC PCV13 Vaccine VIS (Interim) (12/18/11)  Document Released: 08/04/2006 Document Revised: 02/01/2013 Document Reviewed: 01/27/2013  ExitCare® Patient Information ©2014 [employer].

## 2014-01-14 ENCOUNTER — Ambulatory Visit: Payer: Medicare Other | Admitting: Cardiology

## 2014-02-01 NOTE — Telephone Encounter (Signed)
error 

## 2014-02-02 ENCOUNTER — Ambulatory Visit (INDEPENDENT_AMBULATORY_CARE_PROVIDER_SITE_OTHER): Payer: Medicare Other | Admitting: Cardiology

## 2014-02-02 ENCOUNTER — Encounter: Payer: Self-pay | Admitting: Cardiology

## 2014-02-02 VITALS — BP 154/91 | HR 58 | Ht 66.0 in | Wt 174.0 lb

## 2014-02-02 DIAGNOSIS — I2119 ST elevation (STEMI) myocardial infarction involving other coronary artery of inferior wall: Secondary | ICD-10-CM | POA: Diagnosis not present

## 2014-02-02 DIAGNOSIS — I2510 Atherosclerotic heart disease of native coronary artery without angina pectoris: Secondary | ICD-10-CM | POA: Diagnosis not present

## 2014-02-02 NOTE — Progress Notes (Signed)
HPI The patient presents for followup after a acute inferior wall infarct in May of last year. He had thrombectomy of a right coronary artery occlusion. He was initially acute respiratory failure and was cooled. However, he responded very well to therapy.  Since I last saw him he has done well. The patient denies any new symptoms such as chest discomfort, neck or arm discomfort. There has been no new shortness of breath, PND or orthopnea. There have been no reported palpitations, presyncope or syncope.  He does have fatigue.    No Known Allergies  Current Outpatient Prescriptions  Medication Sig Dispense Refill  . aspirin 81 MG tablet Take 1 tablet (81 mg total) by mouth daily.  30 tablet    . atorvastatin (LIPITOR) 80 MG tablet Take 1 tablet (80 mg total) by mouth daily.  30 tablet  11  . Bioflavonoid Products (BIOFLEX) TABS Take 1 tablet by mouth daily.      . Calcium Carbonate-Vitamin D (CALCIUM + D PO) Take 1 tablet by mouth daily.      . Cholecalciferol (VITAMIN D3) 2000 UNITS TABS Take 2,000 Units by mouth daily.      . clopidogrel (PLAVIX) 75 MG tablet Take 1 tablet (75 mg total) by mouth daily.  30 tablet  11  . levothyroxine (SYNTHROID, LEVOTHROID) 75 MCG tablet Take 75 mcg by mouth daily.      . meclizine (ANTIVERT) 12.5 MG tablet Take 12.5-25 mg by mouth 3 (three) times daily as needed for dizziness.      . Multiple Vitamin (MULITIVITAMIN WITH MINERALS) TABS Take 1 tablet by mouth daily.      . nitroGLYCERIN (NITROSTAT) 0.4 MG SL tablet Place 1 tablet (0.4 mg total) under the tongue every 5 (five) minutes as needed for chest pain.  25 tablet  3    Past Medical History  Diagnosis Date  . Hypothyroidism   . Hypertension   . Rectum cancer 1986  . PC (prostate cancer) 1987  . Renal cell cancer   . Osteoporosis   . Esophageal stricture   . Anemia   . CAD (coronary artery disease)     Inferior MI.  LAD mid 80% stenosis, circ mild plaque, RCA 100% with thrombectomy PTCA.  EF 65%   . CKD (chronic kidney disease)   . Myocardial infarction 03/12/13    Mahnoor Mathisen-cardiologist    Past Surgical History  Procedure Laterality Date  . Kidney surgery  2004  . Radiofrequency ablation kidney  2006  . Hernia repair      2007  . Renal sphincter appliance  2007  . Colonoscopy  01/08/2012    Procedure: COLONOSCOPY;  Surgeon: Rogene Houston, MD;  Location: [facility];  Service: Endoscopy;  Laterality: N/A;  1030  . Colostomy Left 1986    colon cancer  . Percutaneous coronary intervention-balloon only      ROS:  As stated in the HPI and negative for all other systems.  PHYSICAL EXAM BP 154/91  Pulse 58  Ht 5\' 6"  (1.676 m)  Wt 174 lb (78.926 kg)  BMI 28.10 kg/m2 GENERAL:  Well appearing NECK:  No jugular venous distention, waveform within normal limits, carotid upstroke brisk and symmetric, no bruits, no thyromegaly LYMPHATICS:  No cervical, inguinal adenopathy LUNGS:  Clear to auscultation bilaterally BACK:  No CVA tenderness, mild lordosis CHEST:  Unremarkable HEART:  PMI not displaced or sustained,S1 and S2 within normal limits, no S3, no S4, no clicks, no rubs, no murmurs ABD:  Flat, positive bowel sounds normal in frequency in pitch, no bruits, no rebound, no guarding, no midline pulsatile mass, no hepatomegaly, no splenomegaly, , colostomy bag in place EXT:  2 plus pulses throughout, no edema, no cyanosis no clubbing  EKG:  Sinus rhythm, rate 62, left axis deviation, first degree AV block, no acute ST-T wave changes. 02/02/2014  ASSESSMENT AND PLAN  CAD: The patient has no new sypmtoms.  No further cardiovascular testing is indicated.  We will continue with aggressive risk reduction and meds as listed.   HTN:  His BP is slightly elevated today but this is unusual.  I will continue the meds as listed.   DYSLIPIDEMIA:  He had this checked in Feb and he was at target per Dr. Laurance Flatten.   AORTIC STENOSIS:  This is moderate and he will be followed clinically.

## 2014-02-02 NOTE — Patient Instructions (Signed)
The current medical regimen is effective;  continue present plan and medications.  Follow up in 1 year with Dr Hochrein.  You will receive a letter in the mail 2 months before you are due.  Please call us when you receive this letter to schedule your follow up appointment.  

## 2014-02-17 DIAGNOSIS — M1710 Unilateral primary osteoarthritis, unspecified knee: Secondary | ICD-10-CM | POA: Diagnosis not present

## 2014-02-24 ENCOUNTER — Other Ambulatory Visit: Payer: Self-pay | Admitting: Dermatology

## 2014-02-24 DIAGNOSIS — C44621 Squamous cell carcinoma of skin of unspecified upper limb, including shoulder: Secondary | ICD-10-CM | POA: Diagnosis not present

## 2014-02-24 DIAGNOSIS — D485 Neoplasm of uncertain behavior of skin: Secondary | ICD-10-CM | POA: Diagnosis not present

## 2014-02-24 DIAGNOSIS — L570 Actinic keratosis: Secondary | ICD-10-CM | POA: Diagnosis not present

## 2014-02-24 DIAGNOSIS — L821 Other seborrheic keratosis: Secondary | ICD-10-CM | POA: Diagnosis not present

## 2014-02-24 DIAGNOSIS — D239 Other benign neoplasm of skin, unspecified: Secondary | ICD-10-CM | POA: Diagnosis not present

## 2014-02-24 DIAGNOSIS — D1801 Hemangioma of skin and subcutaneous tissue: Secondary | ICD-10-CM | POA: Diagnosis not present

## 2014-02-24 DIAGNOSIS — Z85828 Personal history of other malignant neoplasm of skin: Secondary | ICD-10-CM | POA: Diagnosis not present

## 2014-03-02 DIAGNOSIS — Z8546 Personal history of malignant neoplasm of prostate: Secondary | ICD-10-CM | POA: Diagnosis not present

## 2014-03-02 DIAGNOSIS — N289 Disorder of kidney and ureter, unspecified: Secondary | ICD-10-CM | POA: Diagnosis not present

## 2014-03-02 DIAGNOSIS — Z85528 Personal history of other malignant neoplasm of kidney: Secondary | ICD-10-CM | POA: Diagnosis not present

## 2014-03-10 ENCOUNTER — Other Ambulatory Visit (HOSPITAL_COMMUNITY): Payer: Self-pay | Admitting: Physician Assistant

## 2014-03-10 ENCOUNTER — Other Ambulatory Visit: Payer: Self-pay | Admitting: Family Medicine

## 2014-03-10 NOTE — Telephone Encounter (Signed)
Should this go to pcp, since it looks like they manage his lipids? Please advise. Thanks, MI

## 2014-03-11 NOTE — Telephone Encounter (Signed)
Last seen 12/06/13 DWM  Last lipid 12/06/13  Last thyroid 03/12/13

## 2014-03-13 DIAGNOSIS — Z87891 Personal history of nicotine dependence: Secondary | ICD-10-CM | POA: Diagnosis not present

## 2014-03-13 DIAGNOSIS — S40269A Insect bite (nonvenomous) of unspecified shoulder, initial encounter: Secondary | ICD-10-CM | POA: Diagnosis not present

## 2014-03-13 DIAGNOSIS — W57XXXA Bitten or stung by nonvenomous insect and other nonvenomous arthropods, initial encounter: Secondary | ICD-10-CM | POA: Diagnosis not present

## 2014-03-13 DIAGNOSIS — IMO0001 Reserved for inherently not codable concepts without codable children: Secondary | ICD-10-CM | POA: Diagnosis not present

## 2014-03-15 ENCOUNTER — Ambulatory Visit (INDEPENDENT_AMBULATORY_CARE_PROVIDER_SITE_OTHER): Payer: Medicare Other | Admitting: Nurse Practitioner

## 2014-03-15 ENCOUNTER — Telehealth: Payer: Self-pay | Admitting: Family Medicine

## 2014-03-15 ENCOUNTER — Encounter: Payer: Self-pay | Admitting: Nurse Practitioner

## 2014-03-15 VITALS — BP 115/67 | HR 62 | Temp 99.1°F

## 2014-03-15 DIAGNOSIS — I2510 Atherosclerotic heart disease of native coronary artery without angina pectoris: Secondary | ICD-10-CM

## 2014-03-15 DIAGNOSIS — W57XXXA Bitten or stung by nonvenomous insect and other nonvenomous arthropods, initial encounter: Secondary | ICD-10-CM

## 2014-03-15 DIAGNOSIS — T148 Other injury of unspecified body region: Secondary | ICD-10-CM

## 2014-03-15 DIAGNOSIS — Z09 Encounter for follow-up examination after completed treatment for conditions other than malignant neoplasm: Secondary | ICD-10-CM | POA: Diagnosis not present

## 2014-03-15 MED ORDER — DOXYCYCLINE HYCLATE 100 MG PO TABS: 100.0000 mg | ORAL_TABLET | Freq: Two times a day (BID) | ORAL | Status: DC

## 2014-03-15 NOTE — Progress Notes (Signed)
   Subjective:    Patient ID: Frederick Davis, male    DOB: July 09, 1923, 80 y.o.   MRN: [id number]  HPI Patient went to ER Sunday night with a tick bite- They gave him 2 doses of doxycycline and told him to follow up with his PCP. Wife said that daughter died from a tick bite in 10-Feb-1981 and they are very nervous.    Review of Systems  Constitutional: Negative.   HENT: Negative.   Respiratory: Negative.   Cardiovascular: Negative.   Genitourinary: Negative.   Psychiatric/Behavioral: Negative.   All other systems reviewed and are negative.      Objective:   Physical Exam  Constitutional: He is oriented to person, place, and time. He appears well-developed and well-nourished.  Cardiovascular: Normal rate, regular rhythm and normal heart sounds.   Pulmonary/Chest: Effort normal and breath sounds normal.  Neurological: He is alert and oriented to person, place, and time.  Skin: Skin is warm and dry.  Small 1cm erythematous spot under right axilla where tick was.  Psychiatric: He has a normal mood and affect. His behavior is normal. Judgment and thought content normal.    BP 115/67  Pulse 62  Temp(Src) 99.1 F (37.3 C) (Oral)       Assessment & Plan:   1. Hospital discharge follow-up   2. Tick bite    Meds ordered this encounter  Medications  . doxycycline (VIBRA-TABS) 100 MG tablet    Sig: Take 1 tablet (100 mg total) by mouth 2 (two) times daily.    Dispense:  28 tablet    Refill:  0    Order Specific Question:  Supervising Provider    Answer:  Chipper Herb [facility] records reviewed Even though not sure how long tick was there to cause problems will prophylactically  treat with doxy. RTO prn  Mary-Margaret Hassell Done, FNP

## 2014-03-15 NOTE — Patient Instructions (Signed)
Tick Bite Information Ticks are insects that attach themselves to the skin and draw blood for food. There are various types of ticks. Common types include wood ticks and deer ticks. Most ticks live in shrubs and grassy areas. Ticks can climb onto your body when you make contact with leaves or grass where the tick is waiting. The most common places on the body for ticks to attach themselves are the scalp, neck, armpits, waist, and groin. Most tick bites are harmless, but sometimes ticks carry germs that cause diseases. These germs can be spread to a person during the tick's feeding process. The chance of a disease spreading through a tick bite depends on:   The type of tick.  Time of year.   How long the tick is attached.   Geographic location.  HOW CAN YOU PREVENT TICK BITES? Take these steps to help prevent tick bites when you are outdoors:  Wear protective clothing. Long sleeves and long pants are best.   Wear white clothes so you can see ticks more easily.  Tuck your pant legs into your socks.   If walking on a trail, stay in the middle of the trail to avoid brushing against bushes.  Avoid walking through areas with long grass.  Put insect repellent on all exposed skin and along boot tops, pant legs, and sleeve cuffs.   Check clothing, hair, and skin repeatedly and before going inside.   Brush off any ticks that are not attached.  Take a shower or bath as soon as possible after being outdoors.  WHAT IS THE PROPER WAY TO REMOVE A TICK? Ticks should be removed as soon as possible to help prevent diseases caused by tick bites. 1. If latex gloves are available, put them on before trying to remove a tick.  2. Using fine-point tweezers, grasp the tick as close to the skin as possible. You may also use curved forceps or a tick removal tool. Grasp the tick as close to its head as possible. Avoid grasping the tick on its body. 3. Pull gently with steady upward pressure until  the tick lets go. Do not twist the tick or jerk it suddenly. This may break off the tick's head or mouth parts. 4. Do not squeeze or crush the tick's body. This could force disease-carrying fluids from the tick into your body.  5. After the tick is removed, wash the bite area and your hands with soap and water or other disinfectant such as alcohol. 6. Apply a small amount of antiseptic cream or ointment to the bite site.  7. Wash and disinfect any instruments that were used.  Do not try to remove a tick by applying a hot match, petroleum jelly, or fingernail polish to the tick. These methods do not work and may increase the chances of disease being spread from the tick bite.  WHEN SHOULD YOU SEEK MEDICAL CARE? Contact your health care provider if you are unable to remove a tick from your skin or if a part of the tick breaks off and is stuck in the skin.  After a tick bite, you need to be aware of signs and symptoms that could be related to diseases spread by ticks. Contact your health care provider if you develop any of the following in the days or weeks after the tick bite:  Unexplained fever.  Rash. A circular rash that appears days or weeks after the tick bite may indicate the possibility of Lyme disease. The rash may resemble   a target with a bull's-eye and may occur at a different part of your body than the tick bite.  Redness and swelling in the area of the tick bite.   Tender, swollen lymph glands.   Diarrhea.   Weight loss.   Cough.   Fatigue.   Muscle, joint, or bone pain.   Abdominal pain.   Headache.   Lethargy or a change in your level of consciousness.  Difficulty walking or moving your legs.   Numbness in the legs.   Paralysis.  Shortness of breath.   Confusion.   Repeated vomiting.  Document Released: 10/04/2000 Document Revised: 07/28/2013 Document Reviewed: 03/17/2013 [employer] Patient Information 2014 [employer].  

## 2014-03-15 NOTE — Telephone Encounter (Signed)
appt made

## 2014-03-16 DIAGNOSIS — K449 Diaphragmatic hernia without obstruction or gangrene: Secondary | ICD-10-CM | POA: Diagnosis not present

## 2014-03-16 DIAGNOSIS — I517 Cardiomegaly: Secondary | ICD-10-CM | POA: Diagnosis not present

## 2014-03-16 DIAGNOSIS — Z9079 Acquired absence of other genital organ(s): Secondary | ICD-10-CM | POA: Diagnosis not present

## 2014-03-16 DIAGNOSIS — Z0389 Encounter for observation for other suspected diseases and conditions ruled out: Secondary | ICD-10-CM | POA: Diagnosis not present

## 2014-03-16 DIAGNOSIS — N289 Disorder of kidney and ureter, unspecified: Secondary | ICD-10-CM | POA: Diagnosis not present

## 2014-03-16 DIAGNOSIS — Z8546 Personal history of malignant neoplasm of prostate: Secondary | ICD-10-CM | POA: Diagnosis not present

## 2014-03-16 DIAGNOSIS — Z85528 Personal history of other malignant neoplasm of kidney: Secondary | ICD-10-CM | POA: Diagnosis not present

## 2014-03-16 DIAGNOSIS — Z905 Acquired absence of kidney: Secondary | ICD-10-CM | POA: Diagnosis not present

## 2014-03-16 DIAGNOSIS — N281 Cyst of kidney, acquired: Secondary | ICD-10-CM | POA: Diagnosis not present

## 2014-03-16 DIAGNOSIS — C649 Malignant neoplasm of unspecified kidney, except renal pelvis: Secondary | ICD-10-CM | POA: Diagnosis not present

## 2014-03-22 DIAGNOSIS — M1710 Unilateral primary osteoarthritis, unspecified knee: Secondary | ICD-10-CM | POA: Diagnosis not present

## 2014-03-29 DIAGNOSIS — M1710 Unilateral primary osteoarthritis, unspecified knee: Secondary | ICD-10-CM | POA: Diagnosis not present

## 2014-04-01 ENCOUNTER — Other Ambulatory Visit (HOSPITAL_COMMUNITY): Payer: Self-pay | Admitting: Physician Assistant

## 2014-04-05 ENCOUNTER — Encounter: Payer: Self-pay | Admitting: Family Medicine

## 2014-04-05 ENCOUNTER — Ambulatory Visit (INDEPENDENT_AMBULATORY_CARE_PROVIDER_SITE_OTHER): Payer: Medicare Other | Admitting: Family Medicine

## 2014-04-05 VITALS — BP 117/73 | HR 56 | Temp 97.1°F | Ht 66.0 in | Wt 167.0 lb

## 2014-04-05 DIAGNOSIS — I2510 Atherosclerotic heart disease of native coronary artery without angina pectoris: Secondary | ICD-10-CM | POA: Diagnosis not present

## 2014-04-05 DIAGNOSIS — E559 Vitamin D deficiency, unspecified: Secondary | ICD-10-CM

## 2014-04-05 DIAGNOSIS — E039 Hypothyroidism, unspecified: Secondary | ICD-10-CM

## 2014-04-05 DIAGNOSIS — E785 Hyperlipidemia, unspecified: Secondary | ICD-10-CM | POA: Diagnosis not present

## 2014-04-05 DIAGNOSIS — Z8719 Personal history of other diseases of the digestive system: Secondary | ICD-10-CM

## 2014-04-05 DIAGNOSIS — M1712 Unilateral primary osteoarthritis, left knee: Secondary | ICD-10-CM

## 2014-04-05 DIAGNOSIS — IMO0002 Reserved for concepts with insufficient information to code with codable children: Secondary | ICD-10-CM

## 2014-04-05 DIAGNOSIS — Z85048 Personal history of other malignant neoplasm of rectum, rectosigmoid junction, and anus: Secondary | ICD-10-CM

## 2014-04-05 DIAGNOSIS — Z85528 Personal history of other malignant neoplasm of kidney: Secondary | ICD-10-CM

## 2014-04-05 DIAGNOSIS — Z8546 Personal history of malignant neoplasm of prostate: Secondary | ICD-10-CM

## 2014-04-05 DIAGNOSIS — N183 Chronic kidney disease, stage 3 unspecified: Secondary | ICD-10-CM | POA: Diagnosis not present

## 2014-04-05 DIAGNOSIS — M1710 Unilateral primary osteoarthritis, unspecified knee: Secondary | ICD-10-CM

## 2014-04-05 DIAGNOSIS — I7090 Unspecified atherosclerosis: Secondary | ICD-10-CM

## 2014-04-05 DIAGNOSIS — I10 Essential (primary) hypertension: Secondary | ICD-10-CM | POA: Diagnosis not present

## 2014-04-05 NOTE — Progress Notes (Signed)
Subjective:    Patient ID: Frederick Davis, male    DOB: 29-Oct-1922, 80 y.o.   MRN: [id number]  HPI Pt here for follow up and management of chronic medical problems. The patient is doing well. He has no complaints. He has a history of a major heart attack about 1year ago. And has been doing well with this. He also has a history of rectal cancer with a colostomy followed by Dr. Laural Golden in [place], prostate cancer,and renal cell carcinoma followed by Dr. Vito Berger. in [place]. He sees Dr. Susy Manor for his cardiology followup. He also sees the orthopedic surgeon for his severe left knee osteoarthritis. He is is worse today and is perky as usual.         Patient Active Problem List   Diagnosis Date Noted  . Rectal cancer 07/19/2013  . Hyperlipidemia 07/19/2013  . Hypotension arterial 03/18/2013  . Complete heart block 03/18/2013  . Anemia   . CKD (chronic kidney disease) stage 3, GFR 30-59 ml/min 03/12/2013  . Acute MI, inferior wall, initial episode of care 03/12/2013  . Impotence 03/12/2013  . History of renal stone 03/12/2013  . Hiatal hernia 03/12/2013  . H/O lithotripsy 03/12/2013  . SCCA (squamous cell carcinoma) of skin 03/12/2013  . S/P prostatectomy, radical 03/12/2013  . Left renal mass 03/12/2013  . Arthritis 03/12/2013  . ST elevation myocardial infarction (STEMI) of inferior wall 03/12/2013  . Coronary atherosclerosis of native coronary artery 03/12/2013  . Cardiogenic shock 03/12/2013  . Acute respiratory failure 03/12/2013  . Altered mental status 03/12/2013  . Essential hypertension, benign 01/14/2013  . Colostomy in place 01/14/2013  . Prostate cancer 01/14/2013  . Renal cell carcinoma, Right 01/14/2013  . Osteoporosis, unspecified 01/14/2013   Outpatient Encounter Prescriptions as of 04/05/2014  Medication Sig  . aspirin 81 MG tablet Take 1 tablet (81 mg total) by mouth daily.  Marland Kitchen atorvastatin (LIPITOR) 80 MG tablet TAKE 1 TABLET DAILY  . Bioflavonoid  Products (BIOFLEX) TABS Take 1 tablet by mouth daily.  . Calcium Carbonate-Vitamin D (CALCIUM + D PO) Take 1 tablet by mouth daily.  . Cholecalciferol (VITAMIN D3) 2000 UNITS TABS Take 2,000 Units by mouth daily.  . clopidogrel (PLAVIX) 75 MG tablet TAKE 1 TABLET DAILY  . levothyroxine (SYNTHROID, LEVOTHROID) 75 MCG tablet TAKE 1 TABLET DAILY  . Multiple Vitamin (MULITIVITAMIN WITH MINERALS) TABS Take 1 tablet by mouth daily.  . nitroGLYCERIN (NITROSTAT) 0.4 MG SL tablet Place 1 tablet (0.4 mg total) under the tongue every 5 (five) minutes as needed for chest pain.  . [DISCONTINUED] doxycycline (VIBRA-TABS) 100 MG tablet Take 1 tablet (100 mg total) by mouth 2 (two) times daily.    Review of Systems  Constitutional: Negative.   HENT: Negative.   Eyes: Negative.   Respiratory: Negative.   Cardiovascular: Negative.   Gastrointestinal: Negative.   Endocrine: Negative.   Genitourinary: Negative.   Musculoskeletal: Negative.   Skin: Negative.   Allergic/Immunologic: Negative.   Neurological: Negative.   Hematological: Negative.   Psychiatric/Behavioral: Negative.        Objective:   Physical Exam  Nursing note and vitals reviewed. Constitutional: He is oriented to person, place, and time. He appears well-developed and well-nourished. No distress.  Patient is positive despite all of his medical situations and alert and smiling at usual  HENT:  Head: Normocephalic and atraumatic.  Right Ear: External ear normal.  Left Ear: External ear normal.  Nose: Nose normal.  Mouth/Throat: Oropharynx is clear and moist.  No oropharyngeal exudate.  Eyes: Conjunctivae and EOM are normal. Pupils are equal, round, and reactive to light. Right eye exhibits no discharge. Left eye exhibits no discharge. No scleral icterus.  Neck: Normal range of motion. Neck supple. No thyromegaly present.  Carotid bruits not present  Cardiovascular: Normal rate, regular rhythm, normal heart sounds and intact distal  pulses.  Exam reveals no gallop and no friction rub.   No murmur heard. At 60 per minute  Pulmonary/Chest: Effort normal and breath sounds normal. No respiratory distress. He has no wheezes. He has no rales. He exhibits no tenderness.  Abdominal: Soft. Bowel sounds are normal. He exhibits no mass. There is no tenderness. There is no rebound and no guarding.  Musculoskeletal: Normal range of motion. He exhibits edema and tenderness.  Patient has limited range of motion left knee and this is much larger than the right knee deep to hold the degenerative changes are present.  Lymphadenopathy:    He has no cervical adenopathy.  Neurological: He is alert and oriented to person, place, and time. He has normal reflexes.  Skin: Skin is warm and dry. No rash noted. No erythema. No pallor.  Psychiatric: He has a normal mood and affect. His behavior is normal. Judgment and thought content normal.   BP 117/73  Pulse 56  Temp(Src) 97.1 F (36.2 C) (Oral)  Ht 5' 6" (1.676 m)  Wt 167 lb (75.751 kg)  BMI 26.97 kg/m2        Assessment & Plan:  1. CKD (chronic kidney disease) stage 3, GFR 30-59 ml/min - POCT CBC; Future - BMP8+EGFR; Future  2. Hyperlipidemia - POCT CBC; Future - NMR, lipoprofile; Future  3. Essential hypertension, benign - POCT CBC; Future - BMP8+EGFR; Future - Hepatic function panel; Future  4. Hypothyroid - Thyroid Panel With TSH; Future  5. Vitamin D deficiency - Vit D  25 hydroxy (rtn osteoporosis monitoring); Future  6. History of rectal cancer -Followup with Dr. [name]  7. History of colostomy  8. History of prostate cancer  9. History of renal cell carcinoma -Followup with Dr. Balaji   10. ASCVD (arteriosclerotic cardiovascular disease) -Thyroid with Dr. Hochrein  11. Osteoarthritis of left knee -Followup with orthopedist, Dr. Alucio   Patient Instructions                       Medicare Annual Wellness Visit  [place] and the medical providers  at [facility] strive to bring you the best medical care.  In doing so we not only want to address your current medical conditions and concerns but also to detect new conditions early and prevent illness, disease and health-related problems.    Medicare offers a yearly Wellness Visit which allows our clinical staff to assess your need for preventative services including immunizations, lifestyle education, counseling to decrease risk of preventable diseases and screening for fall risk and other medical concerns.    This visit is provided free of charge (no copay) for all Medicare recipients. The clinical pharmacists at [facility] have begun to conduct these Wellness Visits which will also include a thorough review of all your medications.    As you primary medical provider recommend that you make an appointment for your Annual Wellness Visit if you have not done so already this year.  You may set up this appointment before you leave today or you may call back ([phone]) and schedule an appointment.  Please make sure   when you call that you mention that you are scheduling your Annual Wellness Visit with the clinical pharmacist so that the appointment may be made for the proper length of time.     Continue current medications. Continue good therapeutic lifestyle changes which include good diet and exercise. Fall precautions discussed with patient. If an FOBT was given today- please return it to our front desk. If you are over 50 years old - you may need Prevnar 13 or the adult Pneumonia vaccine.  Continue followup with the orthopedic surgeon, the gastroenterologist, and the nephrologist/urologist in [place]. Return to clinic for fasting lab   Don W. Moore MD  

## 2014-04-05 NOTE — Patient Instructions (Addendum)
Medicare Annual Wellness Visit  [facility] and the medical providers at [facility] strive to bring you the best medical care.  In doing so we not only want to address your current medical conditions and concerns but also to detect new conditions early and prevent illness, disease and health-related problems.    Medicare offers a yearly Wellness Visit which allows our clinical staff to assess your need for preventative services including immunizations, lifestyle education, counseling to decrease risk of preventable diseases and screening for fall risk and other medical concerns.    This visit is provided free of charge (no copay) for all Medicare recipients. The clinical pharmacists at [facility] have begun to conduct these Wellness Visits which will also include a thorough review of all your medications.    As you primary medical provider recommend that you make an appointment for your Annual Wellness Visit if you have not done so already this year.  You may set up this appointment before you leave today or you may call back [phone]) and schedule an appointment.  Please make sure when you call that you mention that you are scheduling your Annual Wellness Visit with the clinical pharmacist so that the appointment may be made for the proper length of time.     Continue current medications. Continue good therapeutic lifestyle changes which include good diet and exercise. Fall precautions discussed with patient. If an FOBT was given today- please return it to our front desk. If you are over 63 years old - you may need Prevnar 110 or the adult Pneumonia vaccine.  Continue followup with the orthopedic surgeon, the gastroenterologist, and the nephrologist/urologist in [place]. Return to clinic for fasting lab

## 2014-04-07 DIAGNOSIS — M1710 Unilateral primary osteoarthritis, unspecified knee: Secondary | ICD-10-CM | POA: Diagnosis not present

## 2014-04-12 ENCOUNTER — Other Ambulatory Visit (INDEPENDENT_AMBULATORY_CARE_PROVIDER_SITE_OTHER): Payer: Medicare Other

## 2014-04-12 DIAGNOSIS — E039 Hypothyroidism, unspecified: Secondary | ICD-10-CM

## 2014-04-12 DIAGNOSIS — E785 Hyperlipidemia, unspecified: Secondary | ICD-10-CM

## 2014-04-12 DIAGNOSIS — E559 Vitamin D deficiency, unspecified: Secondary | ICD-10-CM

## 2014-04-12 DIAGNOSIS — N183 Chronic kidney disease, stage 3 unspecified: Secondary | ICD-10-CM | POA: Diagnosis not present

## 2014-04-12 DIAGNOSIS — I10 Essential (primary) hypertension: Secondary | ICD-10-CM | POA: Diagnosis not present

## 2014-04-12 LAB — POCT CBC
Granulocyte percent: 80.1 %G — AB (ref 37–80)
HCT, POC: 35.6 % — AB (ref 43.5–53.7)
Hemoglobin: 11.5 g/dL — AB (ref 14.1–18.1)
Lymph, poc: 1.4 (ref 0.6–3.4)
MCH: 27 pg (ref 27–31.2)
MCHC: 32.2 g/dL (ref 31.8–35.4)
MCV: 83.7 fL (ref 80–97)
MPV: 7 fL (ref 0–99.8)
POC GRANULOCYTE: 6.7 (ref 2–6.9)
POC LYMPH %: 16.8 % (ref 10–50)
Platelet Count, POC: 233 10*3/uL (ref 142–424)
RBC: 4.3 M/uL — AB (ref 4.69–6.13)
RDW, POC: 14.7 %
WBC: 8.4 10*3/uL (ref 4.6–10.2)

## 2014-04-12 NOTE — Progress Notes (Signed)
Pt came in for lab  only 

## 2014-04-13 LAB — HEPATIC FUNCTION PANEL
ALBUMIN: 4 g/dL (ref 3.5–4.7)
ALT: 28 IU/L (ref 0–44)
AST: 27 IU/L (ref 0–40)
Alkaline Phosphatase: 86 IU/L (ref 39–117)
BILIRUBIN DIRECT: 0.19 mg/dL (ref 0.00–0.40)
TOTAL PROTEIN: 6.1 g/dL (ref 6.0–8.5)
Total Bilirubin: 0.5 mg/dL (ref 0.0–1.2)

## 2014-04-13 LAB — BMP8+EGFR
BUN/Creatinine Ratio: 19 (ref 10–22)
BUN: 30 mg/dL — ABNORMAL HIGH (ref 8–27)
CALCIUM: 10.4 mg/dL — AB (ref 8.6–10.2)
CO2: 23 mmol/L (ref 18–29)
CREATININE: 1.57 mg/dL — AB (ref 0.76–1.27)
Chloride: 105 mmol/L (ref 97–108)
GFR calc Af Amer: 45 mL/min/{1.73_m2} — ABNORMAL LOW (ref >59)
GFR calc non Af Amer: 39 mL/min/{1.73_m2} — ABNORMAL LOW (ref >59)
GLUCOSE: 85 mg/dL (ref 65–99)
Potassium: 5 mmol/L (ref 3.5–5.2)
Sodium: 143 mmol/L (ref 134–144)

## 2014-04-13 LAB — NMR, LIPOPROFILE
CHOLESTEROL: 119 mg/dL (ref 100–199)
HDL Cholesterol by NMR: 68 mg/dL (ref >39)
HDL Particle Number: 44.9 umol/L (ref >=30.5)
LDL Particle Number: 412 nmol/L (ref <1000)
LDL SIZE: 20.1 nm (ref >20.5)
LDLC SERPL CALC-MCNC: 38 mg/dL (ref 0–99)
LP-IR Score: 29 (ref <=45)
Small LDL Particle Number: 295 nmol/L (ref <=527)
Triglycerides by NMR: 67 mg/dL (ref 0–149)

## 2014-04-13 LAB — THYROID PANEL WITH TSH
Free Thyroxine Index: 2 (ref 1.2–4.9)
T3 Uptake Ratio: 30 % (ref 24–39)
T4, Total: 6.5 ug/dL (ref 4.5–12.0)
TSH: 1.87 u[IU]/mL (ref 0.450–4.500)

## 2014-04-13 LAB — VITAMIN D 25 HYDROXY (VIT D DEFICIENCY, FRACTURES): VIT D 25 HYDROXY: 42.5 ng/mL (ref 30.0–100.0)

## 2014-04-21 ENCOUNTER — Telehealth: Payer: Self-pay | Admitting: Family Medicine

## 2014-04-21 NOTE — Telephone Encounter (Signed)
Pt aware of lab results. Pt scheduled 66mo rck of hgb for 05/24/14.  Has 55mo appt October with [facility]

## 2014-04-21 NOTE — Telephone Encounter (Signed)
Message copied by Waverly Ferrari on Thu Apr 21, 2014 12:17 PM ------      Message from: Chevis Pretty      Created: Wed Apr 13, 2014  8:09 AM       hgb low- OTC iron supplements daily- need to rechcek in a month      Kidney and liver function stable- creatine is stable- avoid all NSAIDS      Cholesterol looks great      Vitamin d in normal range      Thyroid panel normal      Continue current meds- low fat diet and exercise and recheck in 3 months       ------

## 2014-05-12 DIAGNOSIS — M1710 Unilateral primary osteoarthritis, unspecified knee: Secondary | ICD-10-CM | POA: Diagnosis not present

## 2014-05-24 ENCOUNTER — Other Ambulatory Visit (INDEPENDENT_AMBULATORY_CARE_PROVIDER_SITE_OTHER): Payer: Medicare Other

## 2014-05-24 DIAGNOSIS — D649 Anemia, unspecified: Secondary | ICD-10-CM

## 2014-05-24 LAB — POCT CBC
Granulocyte percent: 71.4 %G (ref 37–80)
HCT, POC: 35.6 % — AB (ref 43.5–53.7)
Hemoglobin: 11.6 g/dL — AB (ref 14.1–18.1)
LYMPH, POC: 1.7 (ref 0.6–3.4)
MCH, POC: 27.9 pg (ref 27–31.2)
MCHC: 32.7 g/dL (ref 31.8–35.4)
MCV: 85.4 fL (ref 80–97)
MPV: 7.3 fL (ref 0–99.8)
PLATELET COUNT, POC: 235 10*3/uL (ref 142–424)
POC GRANULOCYTE: 5.2 (ref 2–6.9)
POC LYMPH PERCENT: 23.6 %L (ref 10–50)
RBC: 4.2 M/uL — AB (ref 4.69–6.13)
RDW, POC: 14.8 %
WBC: 7.3 10*3/uL (ref 4.6–10.2)

## 2014-05-24 NOTE — Progress Notes (Signed)
Pt came in for labs only 

## 2014-06-08 ENCOUNTER — Other Ambulatory Visit: Payer: Self-pay | Admitting: Family Medicine

## 2014-08-09 ENCOUNTER — Ambulatory Visit: Payer: Medicare Other

## 2014-08-09 ENCOUNTER — Other Ambulatory Visit (INDEPENDENT_AMBULATORY_CARE_PROVIDER_SITE_OTHER): Payer: Medicare Other

## 2014-08-09 DIAGNOSIS — E785 Hyperlipidemia, unspecified: Secondary | ICD-10-CM

## 2014-08-09 DIAGNOSIS — I10 Essential (primary) hypertension: Secondary | ICD-10-CM

## 2014-08-09 DIAGNOSIS — E559 Vitamin D deficiency, unspecified: Secondary | ICD-10-CM

## 2014-08-09 DIAGNOSIS — Z23 Encounter for immunization: Secondary | ICD-10-CM | POA: Diagnosis not present

## 2014-08-09 LAB — POCT CBC
Granulocyte percent: 72.1 %G (ref 37–80)
HCT, POC: 37 % — AB (ref 43.5–53.7)
Hemoglobin: 12.1 g/dL — AB (ref 14.1–18.1)
Lymph, poc: 1.7 (ref 0.6–3.4)
MCH: 27.5 pg (ref 27–31.2)
MCHC: 32.7 g/dL (ref 31.8–35.4)
MCV: 84.2 fL (ref 80–97)
MPV: 6.8 fL (ref 0–99.8)
POC Granulocyte: 5 (ref 2–6.9)
POC LYMPH %: 24.3 % (ref 10–50)
Platelet Count, POC: 233 10*3/uL (ref 142–424)
RBC: 4.4 M/uL — AB (ref 4.69–6.13)
RDW, POC: 13.9 %
WBC: 6.9 10*3/uL (ref 4.6–10.2)

## 2014-08-10 LAB — HEPATIC FUNCTION PANEL
ALT: 19 IU/L (ref 0–44)
AST: 25 IU/L (ref 0–40)
Albumin: 4.3 g/dL (ref 3.5–4.7)
Alkaline Phosphatase: 86 IU/L (ref 39–117)
BILIRUBIN DIRECT: 0.19 mg/dL (ref 0.00–0.40)
Total Bilirubin: 0.6 mg/dL (ref 0.0–1.2)
Total Protein: 6.3 g/dL (ref 6.0–8.5)

## 2014-08-10 LAB — NMR, LIPOPROFILE
Cholesterol: 115 mg/dL (ref 100–199)
HDL Cholesterol by NMR: 73 mg/dL (ref >39)
HDL Particle Number: 49.4 umol/L (ref >=30.5)
LDL PARTICLE NUMBER: 420 nmol/L (ref <1000)
LDL SIZE: 19.7 nm (ref >20.5)
LDLC SERPL CALC-MCNC: 29 mg/dL (ref 0–99)
LP-IR Score: 33 (ref <=45)
Small LDL Particle Number: 328 nmol/L (ref <=527)
Triglycerides by NMR: 67 mg/dL (ref 0–149)

## 2014-08-10 LAB — VITAMIN D 25 HYDROXY (VIT D DEFICIENCY, FRACTURES): Vit D, 25-Hydroxy: 43.4 ng/mL (ref 30.0–100.0)

## 2014-08-10 LAB — BMP8+EGFR
BUN/Creatinine Ratio: 20 (ref 10–22)
BUN: 29 mg/dL — ABNORMAL HIGH (ref 8–27)
CO2: 21 mmol/L (ref 18–29)
Calcium: 10.1 mg/dL (ref 8.6–10.2)
Chloride: 104 mmol/L (ref 97–108)
Creatinine, Ser: 1.43 mg/dL — ABNORMAL HIGH (ref 0.76–1.27)
GFR calc Af Amer: 50 mL/min/{1.73_m2} — ABNORMAL LOW (ref >59)
GFR, EST NON AFRICAN AMERICAN: 43 mL/min/{1.73_m2} — AB (ref >59)
Glucose: 84 mg/dL (ref 65–99)
POTASSIUM: 4.8 mmol/L (ref 3.5–5.2)
SODIUM: 142 mmol/L (ref 134–144)

## 2014-08-16 ENCOUNTER — Encounter: Payer: Self-pay | Admitting: Family Medicine

## 2014-08-16 ENCOUNTER — Ambulatory Visit (INDEPENDENT_AMBULATORY_CARE_PROVIDER_SITE_OTHER): Payer: Medicare Other | Admitting: Family Medicine

## 2014-08-16 VITALS — BP 126/82 | HR 52 | Temp 98.7°F | Ht 66.0 in | Wt 169.0 lb

## 2014-08-16 DIAGNOSIS — N183 Chronic kidney disease, stage 3 unspecified: Secondary | ICD-10-CM

## 2014-08-16 DIAGNOSIS — E785 Hyperlipidemia, unspecified: Secondary | ICD-10-CM | POA: Diagnosis not present

## 2014-08-16 DIAGNOSIS — C61 Malignant neoplasm of prostate: Secondary | ICD-10-CM | POA: Diagnosis not present

## 2014-08-16 DIAGNOSIS — M1712 Unilateral primary osteoarthritis, left knee: Secondary | ICD-10-CM

## 2014-08-16 DIAGNOSIS — I2510 Atherosclerotic heart disease of native coronary artery without angina pectoris: Secondary | ICD-10-CM

## 2014-08-16 DIAGNOSIS — I10 Essential (primary) hypertension: Secondary | ICD-10-CM

## 2014-08-16 DIAGNOSIS — IMO0002 Reserved for concepts with insufficient information to code with codable children: Secondary | ICD-10-CM

## 2014-08-16 DIAGNOSIS — Z85528 Personal history of other malignant neoplasm of kidney: Secondary | ICD-10-CM

## 2014-08-16 DIAGNOSIS — Z8719 Personal history of other diseases of the digestive system: Secondary | ICD-10-CM

## 2014-08-16 DIAGNOSIS — Z8546 Personal history of malignant neoplasm of prostate: Secondary | ICD-10-CM

## 2014-08-16 NOTE — Progress Notes (Signed)
Subjective:    Patient ID: Frederick Davis, male    DOB: 03-17-1923, 80 y.o.   MRN: [id number]  HPI Pt here for follow up and management of chronic medical problems. The patient today complains of left knee pain and memory deficits. His most recent lab work will be reviewed with him during the visit today. The patient continues to have his knee pain and will decide if he wants to see the orthopedist again. He goes to [facility] about his renal mass and he sees the gastroenterologist about every 3 years for his colonoscopy. He does complain of some muscle aches and is concerned that Lipitor may be contributing to this. His cholesterol numbers were excellent today so we will ask him to hold the Lipitor until his next visit he will be given an FOBT to return..        Patient Active Problem List   Diagnosis Date Noted  . Rectal cancer 07/19/2013  . Hyperlipidemia 07/19/2013  . Hypotension arterial 03/18/2013  . Complete heart block 03/18/2013  . Anemia   . CKD (chronic kidney disease) stage 3, GFR 30-59 ml/min 03/12/2013  . Acute MI, inferior wall, initial episode of care 03/12/2013  . Impotence 03/12/2013  . History of renal stone 03/12/2013  . Hiatal hernia 03/12/2013  . H/O lithotripsy 03/12/2013  . SCCA (squamous cell carcinoma) of skin 03/12/2013  . S/P prostatectomy, radical 03/12/2013  . Left renal mass 03/12/2013  . Arthritis 03/12/2013  . ST elevation myocardial infarction (STEMI) of inferior wall 03/12/2013  . Coronary atherosclerosis of native coronary artery 03/12/2013  . Cardiogenic shock 03/12/2013  . Acute respiratory failure 03/12/2013  . Altered mental status 03/12/2013  . Essential hypertension, benign 01/14/2013  . Colostomy in place 01/14/2013  . Prostate cancer 01/14/2013  . Renal cell carcinoma, Right 01/14/2013  . Osteoporosis, unspecified 01/14/2013   Outpatient Encounter Prescriptions as of 08/16/2014  Medication Sig  .  aspirin 81 MG tablet Take 1 tablet (81 mg total) by mouth daily.  Marland Kitchen atorvastatin (LIPITOR) 80 MG tablet TAKE 1 TABLET DAILY  . Bioflavonoid Products (BIOFLEX) TABS Take 1 tablet by mouth daily.  . Calcium Carbonate-Vitamin D (CALCIUM + D PO) Take 1 tablet by mouth daily.  . Cholecalciferol (VITAMIN D3) 2000 UNITS TABS Take 2,000 Units by mouth daily.  . clopidogrel (PLAVIX) 75 MG tablet TAKE 1 TABLET DAILY  . levothyroxine (SYNTHROID, LEVOTHROID) 75 MCG tablet TAKE 1 TABLET DAILY  . Multiple Vitamin (MULITIVITAMIN WITH MINERALS) TABS Take 1 tablet by mouth daily.  . nitroGLYCERIN (NITROSTAT) 0.4 MG SL tablet Place 1 tablet (0.4 mg total) under the tongue every 5 (five) minutes as needed for chest pain.    Review of Systems  Constitutional: Negative.   HENT: Negative.   Eyes: Negative.   Respiratory: Negative.   Cardiovascular: Negative.   Gastrointestinal: Negative.   Endocrine: Negative.   Genitourinary: Negative.   Musculoskeletal: Positive for arthralgias (left knee pain ).  Skin: Negative.   Allergic/Immunologic: Negative.   Neurological: Negative.   Hematological: Negative.   Psychiatric/Behavioral: Negative.        Forgets info easy        Objective:   Physical Exam  Nursing note and vitals reviewed. Constitutional: He is oriented to person, place, and time. He appears well-developed and well-nourished. No distress.  The patient is alert and cooperative especially for being almost being 80 years old.  HENT:  Head: Normocephalic and atraumatic.  Right Ear: External  ear normal.  Left Ear: External ear normal.  Nose: Nose normal.  Mouth/Throat: Oropharynx is clear and moist. No oropharyngeal exudate.  Eyes: Conjunctivae and EOM are normal. Pupils are equal, round, and reactive to light. Right eye exhibits no discharge. Left eye exhibits no discharge. No scleral icterus.  Neck: Normal range of motion. Neck supple. No thyromegaly present.  There are no carotid bruits or  cervical adenopathy  Cardiovascular: Normal rate, regular rhythm, normal heart sounds and intact distal pulses.  Exam reveals no gallop and no friction rub.   No murmur heard. 72/m with a grade 2/6 systolic ejection murmur  Pulmonary/Chest: Effort normal and breath sounds normal. No respiratory distress. He has no wheezes. He has no rales. He exhibits no tenderness.  Abdominal: Soft. Bowel sounds are normal. He exhibits no mass. There is no tenderness. There is no rebound and no guarding.  The patient has no masses or tenderness and his colostomy bag is present.  Musculoskeletal: Normal range of motion. He exhibits no edema and no tenderness.  Lymphadenopathy:    He has no cervical adenopathy.  Neurological: He is alert and oriented to person, place, and time. He has normal reflexes. No cranial nerve deficit.  Skin: Skin is warm and dry. No rash noted. No erythema. No pallor.  Psychiatric: He has a normal mood and affect. His behavior is normal. Judgment and thought content normal.  His memory appears pretty good and I would say that if it is diminished that all is more age-related than anything else.   BP 126/82  Pulse 52  Temp(Src) 98.7 F (37.1 C) (Oral)  Ht 5\' 6"  (1.676 m)  Wt 169 lb (76.658 kg)  BMI 27.29 kg/m2        Assessment & Plan:  1. CKD (chronic kidney disease) stage 3, GFR 30-59 ml/min  2. Essential hypertension, benign  3. Hyperlipidemia  4. Prostate cancer  5. History of colostomy  6. History of prostate cancer  7. History of renal cell carcinoma -See gastroenterology every 3 years  8. ASCVD (arteriosclerotic cardiovascular disease) -See cardiology yearly  9. Primary osteoarthritis of left knee -Orthopedist as needed   Patient Instructions                       Medicare Annual Wellness Visit  [facility] and the medical providers at [facility] strive to bring you the best medical care.  In doing so we not only want to  address your current medical conditions and concerns but also to detect new conditions early and prevent illness, disease and health-related problems.    Medicare offers a yearly Wellness Visit which allows our clinical staff to assess your need for preventative services including immunizations, lifestyle education, counseling to decrease risk of preventable diseases and screening for fall risk and other medical concerns.    This visit is provided free of charge (no copay) for all Medicare recipients. The clinical pharmacists at [facility] have begun to conduct these Wellness Visits which will also include a thorough review of all your medications.    As you primary medical provider recommend that you make an appointment for your Annual Wellness Visit if you have not done so already this year.  You may set up this appointment before you leave today or you may call back [phone]) and schedule an appointment.  Please make sure when you call that you mention that you are scheduling your Annual Wellness Visit with  the clinical pharmacist so that the appointment may be made for the proper length of time.      Continue current medications. Continue good therapeutic lifestyle changes which include good diet and exercise. Fall precautions discussed with patient. If an FOBT was given today- please return it to our front desk. If you are over 31 years old - you may need Prevnar 52 or the adult Pneumonia vaccine.  Flu Shots will be available at our office starting mid- September. Please call and schedule a FLU CLINIC APPOINTMENT.   Keep follow-up appointments with him see [facility], gastroenterology, and cardiology. See the orthopedist on an as needed basis and feel free to call and make an appointment with him when he comes to this office. Return the FOBT. Hold Lipitor until the next visit and see if this makes any difference with your memory or your knee pain.   Arrie Senate MD

## 2014-08-16 NOTE — Patient Instructions (Addendum)
Medicare Annual Wellness Visit  [facility] and the medical providers at [facility] strive to bring you the best medical care.  In doing so we not only want to address your current medical conditions and concerns but also to detect new conditions early and prevent illness, disease and health-related problems.    Medicare offers a yearly Wellness Visit which allows our clinical staff to assess your need for preventative services including immunizations, lifestyle education, counseling to decrease risk of preventable diseases and screening for fall risk and other medical concerns.    This visit is provided free of charge (no copay) for all Medicare recipients. The clinical pharmacists at [facility] have begun to conduct these Wellness Visits which will also include a thorough review of all your medications.    As you primary medical provider recommend that you make an appointment for your Annual Wellness Visit if you have not done so already this year.  You may set up this appointment before you leave today or you may call back [phone]) and schedule an appointment.  Please make sure when you call that you mention that you are scheduling your Annual Wellness Visit with the clinical pharmacist so that the appointment may be made for the proper length of time.      Continue current medications. Continue good therapeutic lifestyle changes which include good diet and exercise. Fall precautions discussed with patient. If an FOBT was given today- please return it to our front desk. If you are over 73 years old - you may need Prevnar 24 or the adult Pneumonia vaccine.  Flu Shots will be available at our office starting mid- September. Please call and schedule a FLU CLINIC APPOINTMENT.   Keep follow-up appointments with him see [facility], gastroenterology, and cardiology. See the orthopedist on an as needed basis and feel free  to call and make an appointment with him when he comes to this office. Return the FOBT. Hold Lipitor until the next visit and see if this makes any difference with your memory or your knee pain.

## 2014-08-17 ENCOUNTER — Encounter: Payer: Self-pay | Admitting: Family Medicine

## 2014-09-01 DIAGNOSIS — D485 Neoplasm of uncertain behavior of skin: Secondary | ICD-10-CM | POA: Diagnosis not present

## 2014-09-01 DIAGNOSIS — D225 Melanocytic nevi of trunk: Secondary | ICD-10-CM | POA: Diagnosis not present

## 2014-09-01 DIAGNOSIS — Z85828 Personal history of other malignant neoplasm of skin: Secondary | ICD-10-CM | POA: Diagnosis not present

## 2014-09-01 DIAGNOSIS — L570 Actinic keratosis: Secondary | ICD-10-CM | POA: Diagnosis not present

## 2014-09-08 ENCOUNTER — Other Ambulatory Visit: Payer: Medicare Other

## 2014-09-08 DIAGNOSIS — Z1212 Encounter for screening for malignant neoplasm of rectum: Secondary | ICD-10-CM

## 2014-09-08 NOTE — Progress Notes (Signed)
Lab only 

## 2014-09-10 LAB — FECAL OCCULT BLOOD, IMMUNOCHEMICAL: Fecal Occult Bld: POSITIVE — AB

## 2014-09-21 DIAGNOSIS — Z85828 Personal history of other malignant neoplasm of skin: Secondary | ICD-10-CM | POA: Diagnosis not present

## 2014-09-21 DIAGNOSIS — D481 Neoplasm of uncertain behavior of connective and other soft tissue: Secondary | ICD-10-CM | POA: Diagnosis not present

## 2014-09-29 ENCOUNTER — Encounter (HOSPITAL_COMMUNITY): Payer: Self-pay | Admitting: Cardiovascular Disease

## 2014-10-01 ENCOUNTER — Other Ambulatory Visit: Payer: Self-pay | Admitting: Cardiology

## 2014-10-04 DIAGNOSIS — M67442 Ganglion, left hand: Secondary | ICD-10-CM | POA: Diagnosis not present

## 2014-12-26 ENCOUNTER — Ambulatory Visit (INDEPENDENT_AMBULATORY_CARE_PROVIDER_SITE_OTHER): Payer: Medicare Other | Admitting: Family Medicine

## 2014-12-26 ENCOUNTER — Encounter: Payer: Self-pay | Admitting: Family Medicine

## 2014-12-26 VITALS — BP 137/80 | HR 50 | Temp 97.7°F | Ht 66.0 in | Wt 171.0 lb

## 2014-12-26 DIAGNOSIS — I2510 Atherosclerotic heart disease of native coronary artery without angina pectoris: Secondary | ICD-10-CM | POA: Diagnosis not present

## 2014-12-26 DIAGNOSIS — N183 Chronic kidney disease, stage 3 unspecified: Secondary | ICD-10-CM

## 2014-12-26 DIAGNOSIS — C61 Malignant neoplasm of prostate: Secondary | ICD-10-CM | POA: Diagnosis not present

## 2014-12-26 DIAGNOSIS — I10 Essential (primary) hypertension: Secondary | ICD-10-CM | POA: Diagnosis not present

## 2014-12-26 DIAGNOSIS — E039 Hypothyroidism, unspecified: Secondary | ICD-10-CM | POA: Diagnosis not present

## 2014-12-26 DIAGNOSIS — I2119 ST elevation (STEMI) myocardial infarction involving other coronary artery of inferior wall: Secondary | ICD-10-CM | POA: Diagnosis not present

## 2014-12-26 DIAGNOSIS — E785 Hyperlipidemia, unspecified: Secondary | ICD-10-CM

## 2014-12-26 DIAGNOSIS — E559 Vitamin D deficiency, unspecified: Secondary | ICD-10-CM | POA: Diagnosis not present

## 2014-12-26 NOTE — Patient Instructions (Addendum)
Medicare Annual Wellness Visit  [facility] and the medical providers at [facility] strive to bring you the best medical care.  In doing so we not only want to address your current medical conditions and concerns but also to detect new conditions early and prevent illness, disease and health-related problems.    Medicare offers a yearly Wellness Visit which allows our clinical staff to assess your need for preventative services including immunizations, lifestyle education, counseling to decrease risk of preventable diseases and screening for fall risk and other medical concerns.    This visit is provided free of charge (no copay) for all Medicare recipients. The clinical pharmacists at [facility] have begun to conduct these Wellness Visits which will also include a thorough review of all your medications.    As you primary medical provider recommend that you make an appointment for your Annual Wellness Visit if you have not done so already this year.  You may set up this appointment before you leave today or you may call back [phone]) and schedule an appointment.  Please make sure when you call that you mention that you are scheduling your Annual Wellness Visit with the clinical pharmacist so that the appointment may be made for the proper length of time.     Continue current medications. Continue good therapeutic lifestyle changes which include good diet and exercise. Fall precautions discussed with patient. If an FOBT was given today- please return it to our front desk. If you are over 84 years old - you may need Prevnar 75 or the adult Pneumonia vaccine.  Flu Shots are still available at our office. If you still haven't had one please call to set up a nurse visit to get one.   After your visit with [place] today you will receive a survey in the mail or online from [employer] regarding your care with [place]. Please take a moment to  fill this out. Your feedback is very important to [place] as you can help [place] better understand your patient needs as well as improve your experience and satisfaction. WE CARE ABOUT YOU!!!   Stay active and avoid falls as much as possible Drink plenty of fluids Follow-up with urology and cardiology appointments We will arrange for you to see the orthopedic surgeon regarding your left knee and see if an injection may help in this knee. Return to the office for fasting lab work

## 2014-12-26 NOTE — Progress Notes (Signed)
Subjective:    Patient ID: Frederick Davis, male    DOB: Feb 20, 1923, 81 y.o.   MRN: [id number]  HPI Pt here for follow up and management of chronic medical problems which includes hyperlipidemia, hypertension, and hypothyroid. He is taking medications regularly. The patient is alert and doing well with no specific complaints. He is being followed for his left knee pain by the orthopedic surgeon. He is due for lab work will have to return fasting. Urology does a rectal exam every 6 months. He also sees a cardiologist regularly. He recently had trouble with his garage door and tried to raise it and it fell back and hit his back and irritated his lower thoracic spine posteriorly. It did not make him fall. He follows up regularly with his urologist regarding masses in his kidney. He also sees a cardiologist regularly. He is still having problems with his left knee and we will arrange for him to see the orthopedic surgeon again when he is here the next time.        Patient Active Problem List   Diagnosis Date Noted  . Rectal cancer 07/19/2013  . Hyperlipidemia 07/19/2013  . Hypotension arterial 03/18/2013  . Complete heart block 03/18/2013  . Anemia   . CKD (chronic kidney disease) stage 3, GFR 30-59 ml/min 03/12/2013  . Acute MI, inferior wall, initial episode of care 03/12/2013  . Impotence 03/12/2013  . History of renal stone 03/12/2013  . Hiatal hernia 03/12/2013  . H/O lithotripsy 03/12/2013  . SCCA (squamous cell carcinoma) of skin 03/12/2013  . S/P prostatectomy, radical 03/12/2013  . Left renal mass 03/12/2013  . Arthritis 03/12/2013  . ST elevation myocardial infarction (STEMI) of inferior wall 03/12/2013  . Coronary atherosclerosis of native coronary artery 03/12/2013  . Cardiogenic shock 03/12/2013  . Acute respiratory failure 03/12/2013  . Altered mental status 03/12/2013  . Essential hypertension, benign 01/14/2013  . Colostomy in place 01/14/2013  . Prostate cancer  01/14/2013  . Renal cell carcinoma, Right 01/14/2013  . Osteoporosis, unspecified 01/14/2013   Outpatient Encounter Prescriptions as of 12/26/2014  Medication Sig  . aspirin 81 MG tablet Take 1 tablet (81 mg total) by mouth daily.  Marland Kitchen Bioflavonoid Products (BIOFLEX) TABS Take 1 tablet by mouth daily.  . Calcium Carbonate-Vitamin D (CALCIUM + D PO) Take 1 tablet by mouth daily.  . Cholecalciferol (VITAMIN D3) 2000 UNITS TABS Take 2,000 Units by mouth daily.  . clopidogrel (PLAVIX) 75 MG tablet TAKE 1 TABLET DAILY  . levothyroxine (SYNTHROID, LEVOTHROID) 75 MCG tablet TAKE 1 TABLET DAILY  . Multiple Vitamin (MULITIVITAMIN WITH MINERALS) TABS Take 1 tablet by mouth daily.  . nitroGLYCERIN (NITROSTAT) 0.4 MG SL tablet Place 1 tablet (0.4 mg total) under the tongue every 5 (five) minutes as needed for chest pain.  . [DISCONTINUED] atorvastatin (LIPITOR) 80 MG tablet TAKE 1 TABLET DAILY    Review of Systems  Constitutional: Negative.   HENT: Negative.   Eyes: Negative.   Respiratory: Negative.   Cardiovascular: Negative.   Gastrointestinal: Negative.   Endocrine: Negative.   Genitourinary: Negative.   Musculoskeletal: Positive for arthralgias (some left knee pain- seen Dr Elmyra Ricks).  Skin: Negative.   Allergic/Immunologic: Negative.   Neurological: Negative.   Hematological: Negative.   Psychiatric/Behavioral: Negative.        Objective:   Physical Exam  Constitutional: He is oriented to person, place, and time. He appears well-developed and well-nourished. No distress.  The patient is pleasant and alert specific for  his age of 81 years.  HENT:  Head: Normocephalic and atraumatic.  Right Ear: External ear normal.  Left Ear: External ear normal.  Nose: Nose normal.  Mouth/Throat: Oropharynx is clear and moist. No oropharyngeal exudate.  Eyes: Conjunctivae and EOM are normal. Pupils are equal, round, and reactive to light. Right eye exhibits no discharge. Left eye exhibits no  discharge. No scleral icterus.  Neck: Normal range of motion. Neck supple. No thyromegaly present.  There are no carotid bruits.  Cardiovascular: Normal rate, regular rhythm, normal heart sounds and intact distal pulses.   No murmur heard. Pulmonary/Chest: Effort normal and breath sounds normal. No respiratory distress. He has no wheezes. He has no rales. He exhibits no tenderness.  Abdominal: Soft. Bowel sounds are normal. He exhibits no mass. There is no tenderness. There is no rebound and no guarding.  Colostomy site appears to be draining well. There is no sign of any redness or irritation.  Musculoskeletal: Normal range of motion. He exhibits no edema or tenderness.  There is stiffness in both knees and swelling. There is a contusion over his lower thoracic spine secondary to the garage door incident and there is bruising on the left super iliac area. Somewhat kyphotic and posture  Lymphadenopathy:    He has no cervical adenopathy.  Neurological: He is alert and oriented to person, place, and time. He has normal reflexes. No cranial nerve deficit.  Skin: Skin is warm and dry. No rash noted.  Psychiatric: He has a normal mood and affect. His behavior is normal. Judgment and thought content normal.  Nursing note and vitals reviewed.  BP 137/80 mmHg  Pulse 50  Temp(Src) 97.7 F (36.5 C) (Oral)  Ht '5\' 6"'  (1.676 m)  Wt 171 lb (77.565 kg)  BMI 27.61 kg/m2        Assessment & Plan:  1. CKD (chronic kidney disease) stage 3, GFR 30-59 ml/min -The patient should continue with his current treatment and is reminded to to exercise regularly, watch his sodium intake and avoid NSAIDs. - POCT CBC; Future - BMP8+EGFR; Future  2. Essential hypertension, benign -Blood pressure is good today, continue current treatment - POCT CBC; Future - BMP8+EGFR; Future - Hepatic function panel; Future  3. Hyperlipidemia -Because of his heart disease, he should continue with his atorvastatin and is  much exercise as possible - POCT CBC; Future - NMR, lipoprofile; Future  4. Prostate cancer -Continue follow-up with urologist - POCT CBC; Future  5. ASCVD (arteriosclerotic cardiovascular disease) -Continue to follow-up with cardiology - POCT CBC; Future - NMR, lipoprofile; Future  6. Hypothyroidism, unspecified hypothyroidism type -Until labs are returned the patient should continue with current treatment and he is having no symptoms with his thyroid. - POCT CBC; Future - Thyroid Panel With TSH; Future  7. Vitamin D deficiency -This is been stable in the past and until lab work is returned he should continue current treatment - POCT CBC; Future - Vit D  25 hydroxy (rtn osteoporosis monitoring); Future  8. ST elevation myocardial infarction (STEMI) of inferior wall -He is having no chest pain and he should keep his regular follow-up appointments with the cardiologist.  Patient Instructions                       Medicare Annual Wellness Visit  [facility] and the medical providers at [facility] strive to bring you the best medical care.  In doing so we not only want to  address your current medical conditions and concerns but also to detect new conditions early and prevent illness, disease and health-related problems.    Medicare offers a yearly Wellness Visit which allows our clinical staff to assess your need for preventative services including immunizations, lifestyle education, counseling to decrease risk of preventable diseases and screening for fall risk and other medical concerns.    This visit is provided free of charge (no copay) for all Medicare recipients. The clinical pharmacists at [facility] have begun to conduct these Wellness Visits which will also include a thorough review of all your medications.    As you primary medical provider recommend that you make an appointment for your Annual Wellness Visit if you have not  done so already this year.  You may set up this appointment before you leave today or you may call back ([phone]) and schedule an appointment.  Please make sure when you call that you mention that you are scheduling your Annual Wellness Visit with the clinical pharmacist so that the appointment may be made for the proper length of time.     Continue current medications. Continue good therapeutic lifestyle changes which include good diet and exercise. Fall precautions discussed with patient. If an FOBT was given today- please return it to our front desk. If you are over 75 years old - you may need Prevnar 72 or the adult Pneumonia vaccine.  Flu Shots are still available at our office. If you still haven't had one please call to set up a nurse visit to get one.   After your visit with [place] today you will receive a survey in the mail or online from [employer] regarding your care with [place]. Please take a moment to fill this out. Your feedback is very important to [place] as you can help [place] better understand your patient needs as well as improve your experience and satisfaction. WE CARE ABOUT YOU!!!   Stay active and avoid falls as much as possible Drink plenty of fluids Follow-up with urology and cardiology appointments We will arrange for you to see the orthopedic surgeon regarding your left knee and see if an injection may help in this knee. Return to the office for fasting lab work   Arrie Senate MD

## 2014-12-29 ENCOUNTER — Other Ambulatory Visit (INDEPENDENT_AMBULATORY_CARE_PROVIDER_SITE_OTHER): Payer: Medicare Other

## 2014-12-29 DIAGNOSIS — N183 Chronic kidney disease, stage 3 unspecified: Secondary | ICD-10-CM

## 2014-12-29 DIAGNOSIS — E785 Hyperlipidemia, unspecified: Secondary | ICD-10-CM

## 2014-12-29 DIAGNOSIS — I2510 Atherosclerotic heart disease of native coronary artery without angina pectoris: Secondary | ICD-10-CM

## 2014-12-29 DIAGNOSIS — C61 Malignant neoplasm of prostate: Secondary | ICD-10-CM | POA: Diagnosis not present

## 2014-12-29 DIAGNOSIS — E559 Vitamin D deficiency, unspecified: Secondary | ICD-10-CM

## 2014-12-29 DIAGNOSIS — I10 Essential (primary) hypertension: Secondary | ICD-10-CM | POA: Diagnosis not present

## 2014-12-29 DIAGNOSIS — E039 Hypothyroidism, unspecified: Secondary | ICD-10-CM

## 2014-12-29 LAB — POCT CBC
GRANULOCYTE PERCENT: 27.5 % — AB (ref 37–80)
HCT, POC: 39 % — AB (ref 43.5–53.7)
Hemoglobin: 11.9 g/dL — AB (ref 14.1–18.1)
LYMPH, POC: 2 (ref 0.6–3.4)
MCH, POC: 26 pg — AB (ref 27–31.2)
MCHC: 30.5 g/dL — AB (ref 31.8–35.4)
MCV: 85.3 fL (ref 80–97)
MPV: 6.4 fL (ref 0–99.8)
PLATELET COUNT, POC: 284 10*3/uL (ref 142–424)
POC GRANULOCYTE: 4.7 (ref 2–6.9)
POC LYMPH %: 27.5 % (ref 10–50)
RBC: 4.57 M/uL — AB (ref 4.69–6.13)
RDW, POC: 14.5 %
WBC: 7.3 10*3/uL (ref 4.6–10.2)

## 2014-12-29 NOTE — Progress Notes (Signed)
Lab only 

## 2014-12-30 LAB — NMR, LIPOPROFILE
CHOLESTEROL: 160 mg/dL (ref 100–199)
HDL Cholesterol by NMR: 72 mg/dL (ref >39)
HDL Particle Number: 43.3 umol/L (ref >=30.5)
LDL Particle Number: 812 nmol/L (ref <1000)
LDL Size: 20.8 nm (ref >20.5)
LDL-C: 73 mg/dL (ref 0–99)
LP-IR Score: <25 (ref <=45)
SMALL LDL PARTICLE NUMBER: 318 nmol/L (ref <=527)
TRIGLYCERIDES BY NMR: 73 mg/dL (ref 0–149)

## 2014-12-30 LAB — HEPATIC FUNCTION PANEL
ALT: 12 IU/L (ref 0–44)
AST: 20 IU/L (ref 0–40)
Albumin: 4.2 g/dL (ref 3.2–4.6)
Alkaline Phosphatase: 93 IU/L (ref 39–117)
Bilirubin Total: 0.7 mg/dL (ref 0.0–1.2)
Bilirubin, Direct: 0.2 mg/dL (ref 0.00–0.40)
Total Protein: 6.3 g/dL (ref 6.0–8.5)

## 2014-12-30 LAB — BMP8+EGFR
BUN/Creatinine Ratio: 21 (ref 10–22)
BUN: 28 mg/dL (ref 10–36)
CO2: 20 mmol/L (ref 18–29)
CREATININE: 1.33 mg/dL — AB (ref 0.76–1.27)
Calcium: 10.2 mg/dL (ref 8.6–10.2)
Chloride: 105 mmol/L (ref 97–108)
GFR calc Af Amer: 54 mL/min/{1.73_m2} — ABNORMAL LOW (ref >59)
GFR, EST NON AFRICAN AMERICAN: 47 mL/min/{1.73_m2} — AB (ref >59)
GLUCOSE: 86 mg/dL (ref 65–99)
Potassium: 4.9 mmol/L (ref 3.5–5.2)
SODIUM: 141 mmol/L (ref 134–144)

## 2014-12-30 LAB — VITAMIN D 25 HYDROXY (VIT D DEFICIENCY, FRACTURES): VIT D 25 HYDROXY: 39.9 ng/mL (ref 30.0–100.0)

## 2014-12-30 LAB — THYROID PANEL WITH TSH
FREE THYROXINE INDEX: 3 (ref 1.2–4.9)
T3 UPTAKE RATIO: 33 % (ref 24–39)
T4 TOTAL: 9.1 ug/dL (ref 4.5–12.0)
TSH: 2.33 u[IU]/mL (ref 0.450–4.500)

## 2015-01-06 NOTE — Progress Notes (Signed)
Lmtcb/ww 3/18

## 2015-01-16 ENCOUNTER — Encounter (INDEPENDENT_AMBULATORY_CARE_PROVIDER_SITE_OTHER): Payer: Self-pay | Admitting: *Deleted

## 2015-01-28 IMAGING — CR DG CHEST 2V
2 series · 2 of 2 positions shown · non-contrast
Comparison: None.

CLINICAL DATA: Personal history of renal cell carcinoma., fatigue

CHEST - 2 VIEW

[view not recorded (1 of 2)]
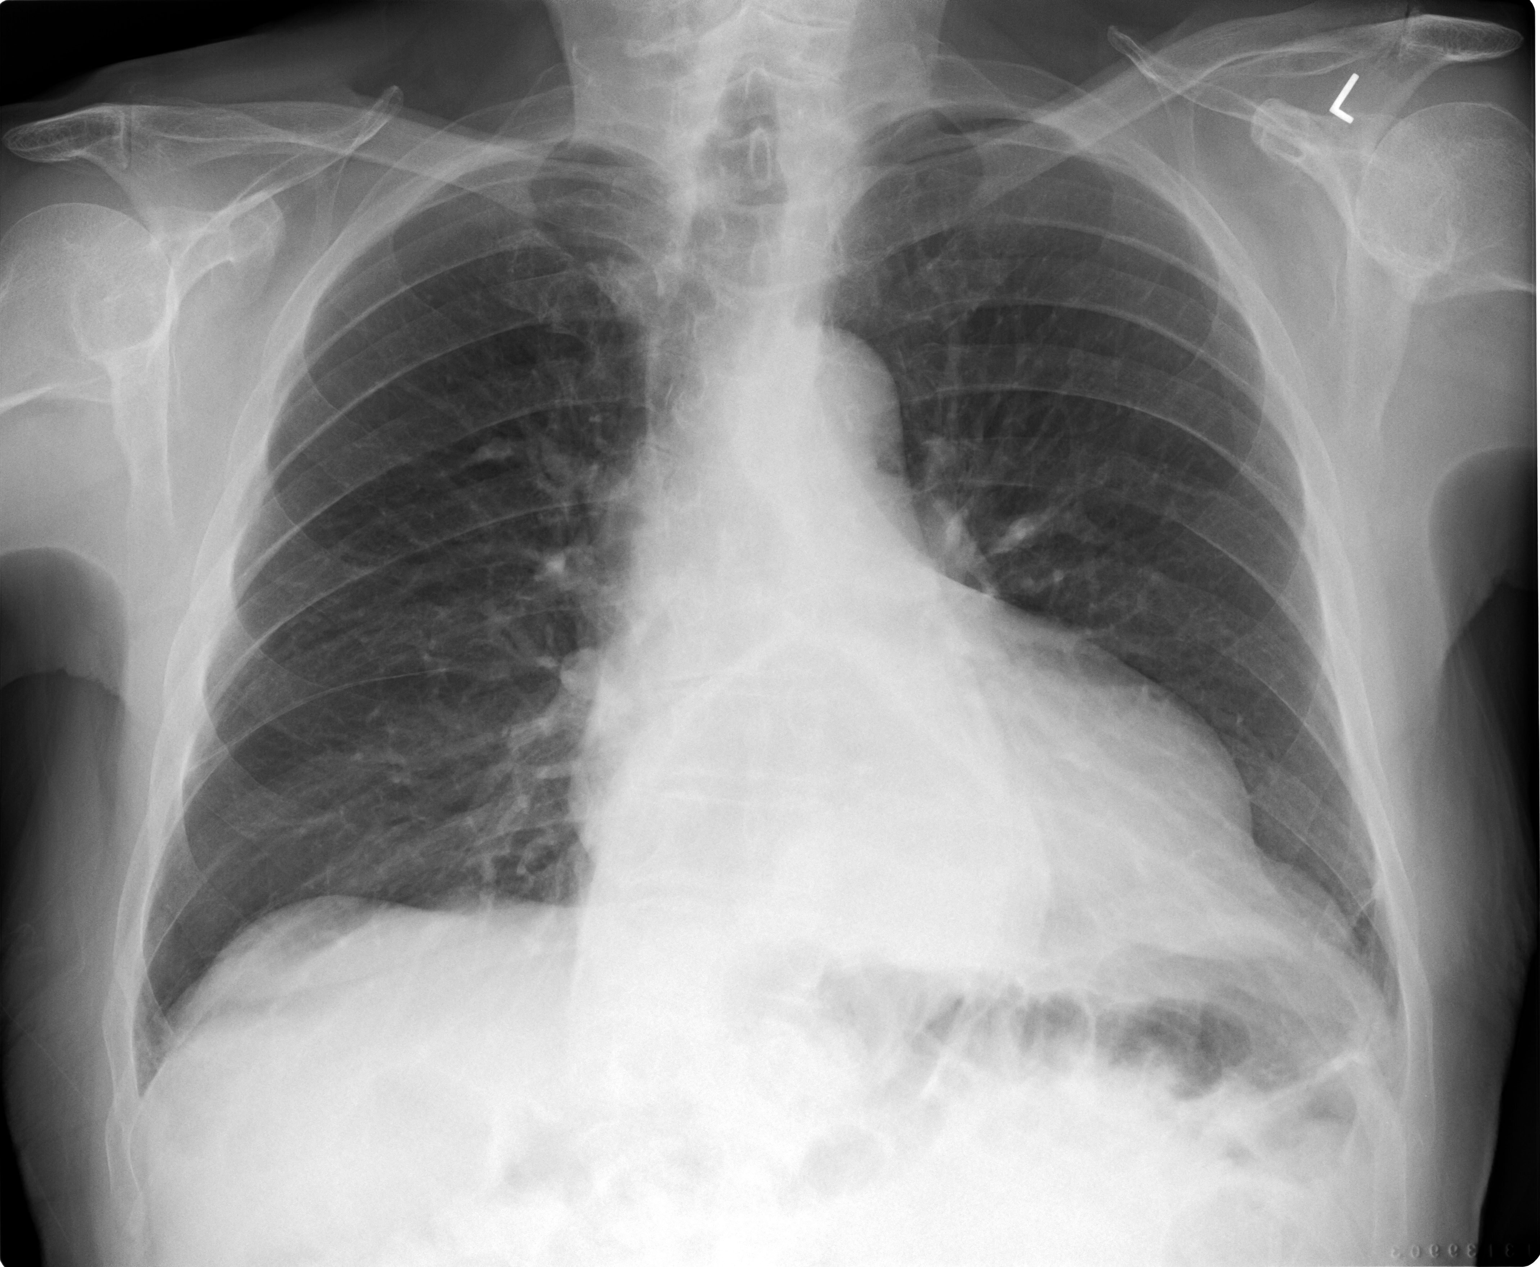

[view not recorded (2 of 2)]
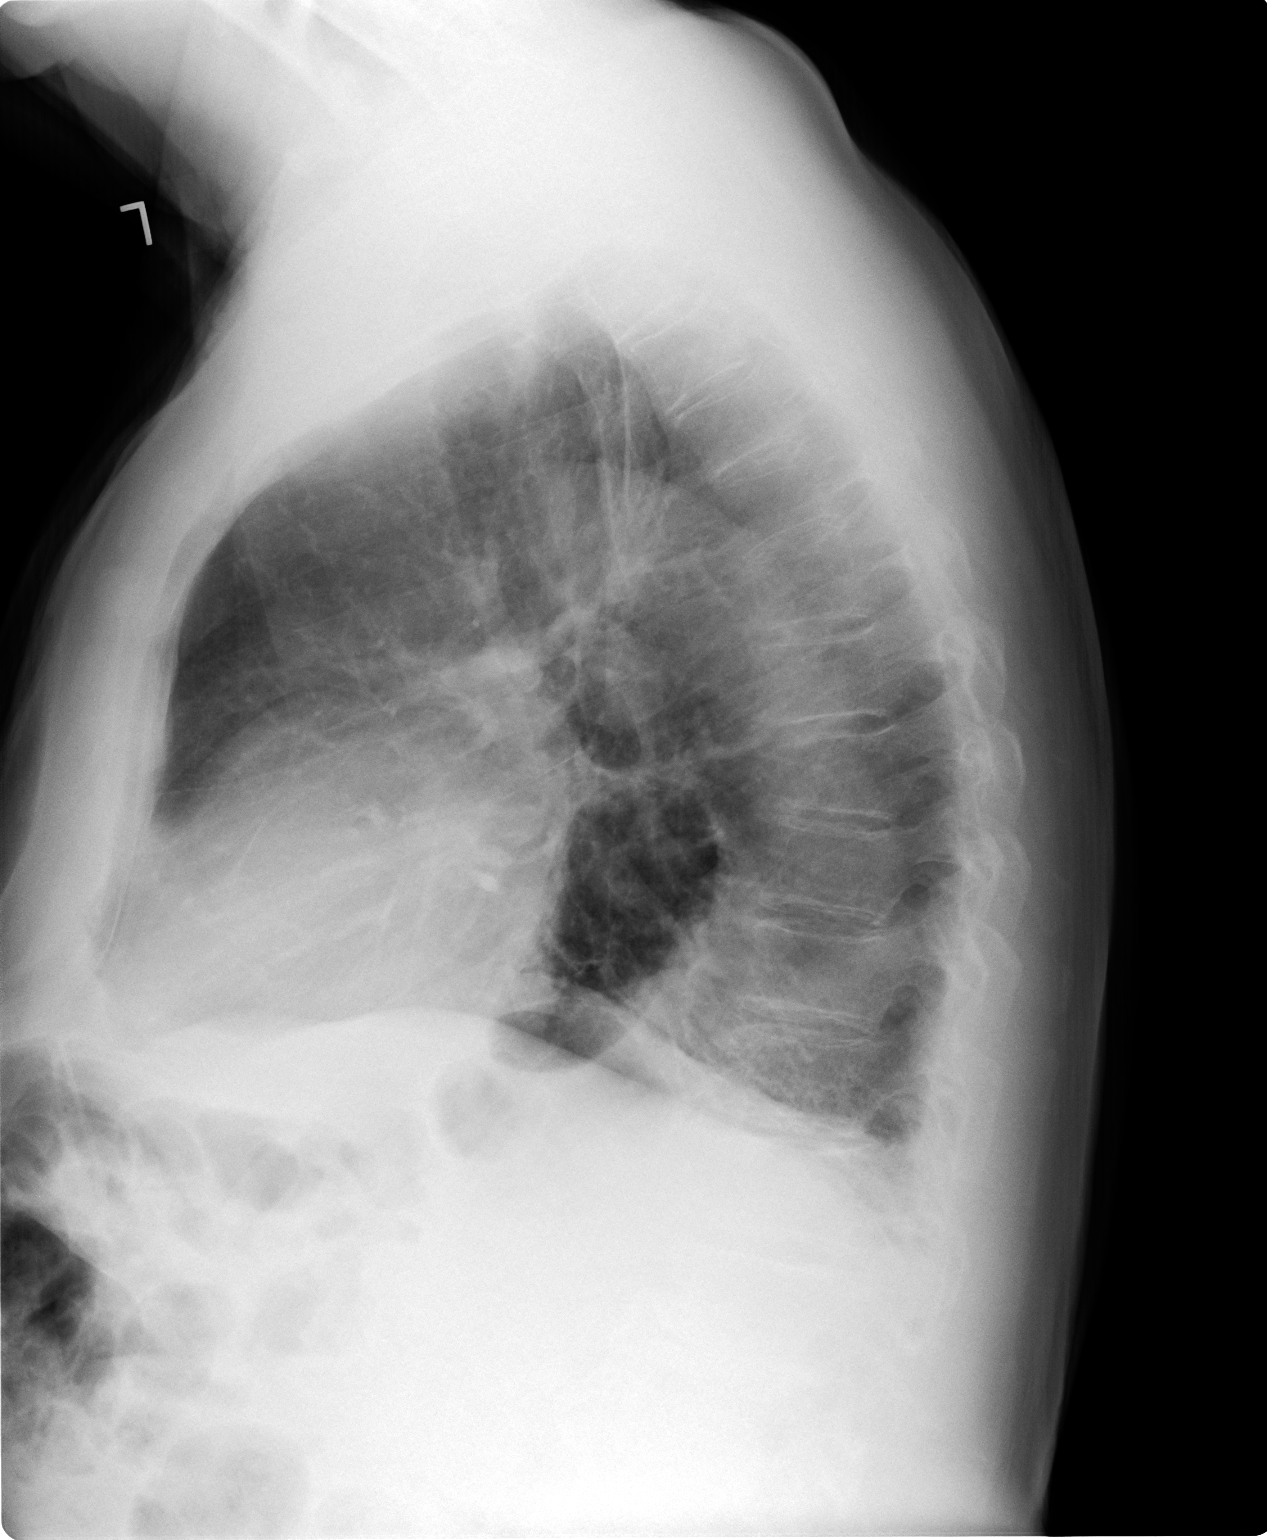

[2 of 2 positions shown; findings below may reference images not displayed]

FINDINGS: The heart is mildly enlarged in size.  A large hiatal
hernia is noted.  Tortuosity of the thoracic aorta is seen although
no definitive aneurysmal dilatation is noted.  The lungs are clear
bilaterally.
IMPRESSION: Large hiatal hernia.

Mild cardiomegaly.

## 2015-02-06 ENCOUNTER — Other Ambulatory Visit: Payer: Self-pay | Admitting: Cardiology

## 2015-02-10 ENCOUNTER — Ambulatory Visit (INDEPENDENT_AMBULATORY_CARE_PROVIDER_SITE_OTHER): Payer: Medicare Other | Admitting: Physician Assistant

## 2015-02-10 ENCOUNTER — Encounter: Payer: Self-pay | Admitting: Physician Assistant

## 2015-02-10 ENCOUNTER — Other Ambulatory Visit: Payer: Self-pay | Admitting: Physician Assistant

## 2015-02-10 VITALS — BP 158/90 | HR 61 | Temp 97.9°F | Ht 66.0 in | Wt 171.0 lb

## 2015-02-10 DIAGNOSIS — I2510 Atherosclerotic heart disease of native coronary artery without angina pectoris: Secondary | ICD-10-CM | POA: Diagnosis not present

## 2015-02-10 DIAGNOSIS — S239XXA Sprain of unspecified parts of thorax, initial encounter: Secondary | ICD-10-CM | POA: Diagnosis not present

## 2015-02-10 MED ORDER — MELOXICAM 7.5 MG PO TABS: 7.5000 mg | ORAL_TABLET | Freq: Every day | ORAL | Status: DC

## 2015-02-10 MED ORDER — CYCLOBENZAPRINE HCL 10 MG PO TABS: 10.0000 mg | ORAL_TABLET | Freq: Three times a day (TID) | ORAL | Status: DC | PRN

## 2015-02-10 NOTE — Patient Instructions (Signed)

## 2015-02-10 NOTE — Progress Notes (Signed)
Subjective:    Patient ID: Frederick Davis, male    DOB: Mar 16, 1923, 81 y.o.   MRN: [id number]  HPI 81 year old male presents with lower back pain x 2 days when he was playing golf. He has tried expired Flexeril with no relief. Pain is constant. Achy. Worse with walking and sitting. Some relief with lying on left side.     Review of Systems  Musculoskeletal: Positive for myalgias, back pain and arthralgias.       Objective:   Physical Exam  Constitutional: He is oriented to person, place, and time. He appears well-developed and well-nourished.  Very active for his age.  Pleasant   Musculoskeletal: He exhibits no edema or tenderness.  TTP on right sacroiliac joint.  Decreased ROM d/t pain Pain when sitting, very uncomfortable on exam table Negative for weakness in plantar flexion or extension    Neurological: He is alert and oriented to person, place, and time.  Psychiatric: He has a normal mood and affect.  Nursing note and vitals reviewed.         Assessment & Plan:  1. Back sprain, initial encounter  - meloxicam (MOBIC) 7.5 MG tablet; Take 1 tablet (7.5 mg total) by mouth daily.  Dispense: 60 tablet; Refill: 1 - cyclobenzaprine (FLEXERIL) 10 MG tablet; Take 1 tablet (10 mg total) by mouth 3 (three) times daily as needed for muscle spasms.  Dispense: 60 tablet; Refill: 1   - Alternate heat and ice 20 min.  - Rest - No golf - F/U in 1 week for recheck and referral to ortho if needed.  - Declined xray d/t patients history of excessive history of radiation  Oneil Behney A. Chauncey Reading PA-C

## 2015-02-13 ENCOUNTER — Ambulatory Visit (INDEPENDENT_AMBULATORY_CARE_PROVIDER_SITE_OTHER): Payer: Medicare Other | Admitting: Family Medicine

## 2015-02-13 ENCOUNTER — Encounter: Payer: Self-pay | Admitting: Family Medicine

## 2015-02-13 ENCOUNTER — Ambulatory Visit (INDEPENDENT_AMBULATORY_CARE_PROVIDER_SITE_OTHER): Payer: Medicare Other

## 2015-02-13 VITALS — BP 145/83 | HR 63 | Temp 96.9°F | Ht 66.0 in | Wt 170.0 lb

## 2015-02-13 DIAGNOSIS — M545 Low back pain: Secondary | ICD-10-CM

## 2015-02-13 DIAGNOSIS — M5136 Other intervertebral disc degeneration, lumbar region: Secondary | ICD-10-CM

## 2015-02-13 DIAGNOSIS — M4310 Spondylolisthesis, site unspecified: Secondary | ICD-10-CM

## 2015-02-13 DIAGNOSIS — M419 Scoliosis, unspecified: Secondary | ICD-10-CM

## 2015-02-13 DIAGNOSIS — M4120 Other idiopathic scoliosis, site unspecified: Secondary | ICD-10-CM | POA: Diagnosis not present

## 2015-02-13 DIAGNOSIS — M810 Age-related osteoporosis without current pathological fracture: Secondary | ICD-10-CM | POA: Diagnosis not present

## 2015-02-13 DIAGNOSIS — M79651 Pain in right thigh: Secondary | ICD-10-CM

## 2015-02-13 DIAGNOSIS — S32000A Wedge compression fracture of unspecified lumbar vertebra, initial encounter for closed fracture: Secondary | ICD-10-CM | POA: Diagnosis not present

## 2015-02-13 DIAGNOSIS — Q762 Congenital spondylolisthesis: Secondary | ICD-10-CM

## 2015-02-13 MED ORDER — TRAMADOL HCL 50 MG PO TABS: ORAL_TABLET | ORAL | Status: DC

## 2015-02-13 MED ORDER — NITROGLYCERIN 0.4 MG SL SUBL: SUBLINGUAL_TABLET | SUBLINGUAL | Status: DC

## 2015-02-13 NOTE — Patient Instructions (Signed)
Use your walker Please be careful and do not put yourself at risk for falling We will call you with the results of the x-ray report Take the pain medicine only if needed Do not take any NSAIDs

## 2015-02-13 NOTE — Progress Notes (Signed)
Subjective:    Patient ID: Frederick Davis, male    DOB: Aug 19, 1923, 81 y.o.   MRN: [id number]  HPI Patient here today for low back pain that started on Wednesday. He is accompanied today by his wife. This started 5 days ago.      Patient Active Problem List   Diagnosis Date Noted  . Rectal cancer 07/19/2013  . Hyperlipidemia 07/19/2013  . Hypotension arterial 03/18/2013  . Complete heart block 03/18/2013  . Anemia   . CKD (chronic kidney disease) stage 3, GFR 30-59 ml/min 03/12/2013  . Acute MI, inferior wall, initial episode of care 03/12/2013  . Impotence 03/12/2013  . History of renal stone 03/12/2013  . Hiatal hernia 03/12/2013  . H/O lithotripsy 03/12/2013  . SCCA (squamous cell carcinoma) of skin 03/12/2013  . S/P prostatectomy, radical 03/12/2013  . Left renal mass 03/12/2013  . Arthritis 03/12/2013  . ST elevation myocardial infarction (STEMI) of inferior wall 03/12/2013  . Coronary atherosclerosis of native coronary artery 03/12/2013  . Cardiogenic shock 03/12/2013  . Acute respiratory failure 03/12/2013  . Altered mental status 03/12/2013  . Essential hypertension, benign 01/14/2013  . Colostomy in place 01/14/2013  . Prostate cancer 01/14/2013  . Renal cell carcinoma, Right 01/14/2013  . Osteoporosis, unspecified 01/14/2013   Outpatient Encounter Prescriptions as of 02/13/2015  Medication Sig  . aspirin 81 MG tablet Take 1 tablet (81 mg total) by mouth daily.  Marland Kitchen Bioflavonoid Products (BIOFLEX) TABS Take 1 tablet by mouth daily.  . Calcium Carbonate-Vitamin D (CALCIUM + D PO) Take 1 tablet by mouth daily.  . Cholecalciferol (VITAMIN D3) 2000 UNITS TABS Take 2,000 Units by mouth daily.  . clopidogrel (PLAVIX) 75 MG tablet TAKE 1 TABLET DAILY  . cyclobenzaprine (FLEXERIL) 10 MG tablet Take 1 tablet (10 mg total) by mouth 3 (three) times daily as needed for muscle spasms.  . ferrous gluconate (FERGON) 324 MG tablet Take 324 mg by mouth daily with breakfast.    . levothyroxine (SYNTHROID, LEVOTHROID) 75 MCG tablet TAKE 1 TABLET DAILY  . Multiple Vitamin (MULITIVITAMIN WITH MINERALS) TABS Take 1 tablet by mouth daily.  Marland Kitchen NITROSTAT 0.4 MG SL tablet PLACE 1 TABLET UNDER THE TONGUE AT ONSET OF CHEST PAIN EVERY 5 MINTUES UP TO 3 TIMES AS NEEDED  . meloxicam (MOBIC) 7.5 MG tablet Take 1 tablet (7.5 mg total) by mouth daily. (Patient not taking: Reported on 02/13/2015)     Review of Systems  Constitutional: Negative.   HENT: Negative.   Eyes: Negative.   Respiratory: Negative.   Cardiovascular: Negative.   Gastrointestinal: Negative.   Endocrine: Negative.   Genitourinary: Negative.   Musculoskeletal: Positive for back pain (right -lower back pain).  Skin: Negative.   Allergic/Immunologic: Negative.   Neurological: Negative.   Hematological: Negative.   Psychiatric/Behavioral: Negative.        Objective:   Physical Exam  Constitutional: He is oriented to person, place, and time. He appears well-developed and well-nourished. No distress.  Kyphotic, alert  HENT:  Head: Normocephalic and atraumatic.  Eyes: Conjunctivae and EOM are normal. Pupils are equal, round, and reactive to light. Right eye exhibits no discharge. Left eye exhibits no discharge. No scleral icterus.  Pulmonary/Chest: Effort normal.  Abdominal: Soft. Bowel sounds are normal. There is no tenderness.  Musculoskeletal: He exhibits no edema or tenderness.  The patient has a hesitant range of motion. The reflexes in the lower extremity with diminished on the right more than the left. Leg raising and  hip abduction were done without problems on this patient. The patient was tender in the right lumbosacral spine area. He had a significant amount of pain not as much with laying down but with sitting up in the same area with some radiation of pain to the right lateral thigh  Neurological: He is alert and oriented to person, place, and time.  Skin: Skin is warm and dry. No rash noted.   Psychiatric: He has a normal mood and affect. His behavior is normal. Judgment and thought content normal.  Nursing note and vitals reviewed.  BP 145/83 mmHg  Pulse 63  Temp(Src) 96.9 F (36.1 C) (Oral)  Ht 5\' 6"  (1.676 m)  Wt 170 lb (77.111 kg)  BMI 27.45 kg/m2   [facility] reading (PRIMARY) by  Dr. Bobbe Medico spine--scoliosis, degenerative disc changes, appearing to be anterolisthesis of L5 on S1                                      Assessment & Plan:  1. Right low back pain, with sciatica presence unspecified - DG Lumbar Spine 2-3 Views; Future  2. Right thigh pain -Patient feels this thigh pain with certain movements. - DG Lumbar Spine 2-3 Views; Future  3. Degenerative disc disease, lumbar -This was apparent from the x-ray  4. Anterolisthesis -This is also apparent from the x-ray  5. Idiopathic scoliosis  6. Osteoporosis -All vertebrae visualize had decreased density  Meds ordered this encounter  Medications  . nitroGLYCERIN (NITROSTAT) 0.4 MG SL tablet    Sig: PLACE 1 TABLET UNDER THE TONGUE AT ONSET OF CHEST PAIN EVERY 5 MINTUES UP TO 3 TIMES AS NEEDED    Dispense:  25 tablet    Refill:  11  . traMADol (ULTRAM) 50 MG tablet    Sig: 1/2 tab QID PRN.    Dispense:  20 tablet    Refill:  0   Patient Instructions  Use your walker Please be careful and do not put yourself at risk for falling We will call you with the results of the x-ray report Take the pain medicine only if needed Do not take any NSAIDs   Arrie Senate MD

## 2015-02-14 NOTE — Addendum Note (Signed)
Addended by: Zannie Cove on: 02/14/2015 04:44 PM   Modules accepted: Orders

## 2015-02-15 ENCOUNTER — Ambulatory Visit (HOSPITAL_COMMUNITY)
Admission: RE | Admit: 2015-02-15 | Discharge: 2015-02-15 | Disposition: A | Discharge status: Home / Self Care | Payer: Medicare Other | Source: Ambulatory Visit | Attending: Family Medicine | Admitting: Family Medicine

## 2015-02-15 ENCOUNTER — Ambulatory Visit (INDEPENDENT_AMBULATORY_CARE_PROVIDER_SITE_OTHER): Payer: Medicare Other | Admitting: Cardiology

## 2015-02-15 ENCOUNTER — Encounter: Payer: Self-pay | Admitting: Cardiology

## 2015-02-15 ENCOUNTER — Telehealth: Payer: Self-pay | Admitting: Family Medicine

## 2015-02-15 VITALS — BP 145/89 | HR 59 | Ht 66.0 in | Wt 174.0 lb

## 2015-02-15 DIAGNOSIS — M79651 Pain in right thigh: Secondary | ICD-10-CM

## 2015-02-15 DIAGNOSIS — M4186 Other forms of scoliosis, lumbar region: Secondary | ICD-10-CM | POA: Diagnosis not present

## 2015-02-15 DIAGNOSIS — S32000A Wedge compression fracture of unspecified lumbar vertebra, initial encounter for closed fracture: Secondary | ICD-10-CM

## 2015-02-15 DIAGNOSIS — M419 Scoliosis, unspecified: Secondary | ICD-10-CM

## 2015-02-15 DIAGNOSIS — I2510 Atherosclerotic heart disease of native coronary artery without angina pectoris: Secondary | ICD-10-CM

## 2015-02-15 DIAGNOSIS — M5136 Other intervertebral disc degeneration, lumbar region: Secondary | ICD-10-CM

## 2015-02-15 DIAGNOSIS — M545 Low back pain: Secondary | ICD-10-CM | POA: Diagnosis not present

## 2015-02-15 DIAGNOSIS — M47896 Other spondylosis, lumbar region: Secondary | ICD-10-CM | POA: Insufficient documentation

## 2015-02-15 DIAGNOSIS — M810 Age-related osteoporosis without current pathological fracture: Secondary | ICD-10-CM

## 2015-02-15 DIAGNOSIS — M5126 Other intervertebral disc displacement, lumbar region: Secondary | ICD-10-CM | POA: Diagnosis not present

## 2015-02-15 NOTE — Progress Notes (Signed)
HPI The patient presents for followup after a acute inferior wall infarct in May of 2014. He had thrombectomy of a right coronary artery occlusion. He was initially acute respiratory failure and was cooled. However, he responded very well to therapy.  Since I last saw him he has done well . The patient denies any new symptoms such as chest discomfort, neck or arm discomfort. There has been no new shortness of breath, PND or orthopnea. There have been no reported palpitations, presyncope or syncope. Unfortunately just recently [place] his back. He is in severe pain now and is to have an MRI today.  Prior to this he had been golfing and actually had his best round.   Allergies  Allergen Reactions  . Lipitor [Atorvastatin] Other (See Comments)    Muscle aches    Current Outpatient Prescriptions  Medication Sig Dispense Refill  . aspirin 81 MG tablet Take 1 tablet (81 mg total) by mouth daily.  30 tablet    . atorvastatin (LIPITOR) 80 MG tablet Take 1 tablet (80 mg total) by mouth daily.  30 tablet  11  . Bioflavonoid Products (BIOFLEX) TABS Take 1 tablet by mouth daily.      . Calcium Carbonate-Vitamin D (CALCIUM + D PO) Take 1 tablet by mouth daily.      . Cholecalciferol (VITAMIN D3) 2000 UNITS TABS Take 2,000 Units by mouth daily.      . clopidogrel (PLAVIX) 75 MG tablet Take 1 tablet (75 mg total) by mouth daily.  30 tablet  11  . levothyroxine (SYNTHROID, LEVOTHROID) 75 MCG tablet Take 75 mcg by mouth daily.      . meclizine (ANTIVERT) 12.5 MG tablet Take 12.5-25 mg by mouth 3 (three) times daily as needed for dizziness.      . Multiple Vitamin (MULITIVITAMIN WITH MINERALS) TABS Take 1 tablet by mouth daily.      . nitroGLYCERIN (NITROSTAT) 0.4 MG SL tablet Place 1 tablet (0.4 mg total) under the tongue every 5 (five) minutes as needed for chest pain.  25 tablet  3    Past Medical History  Diagnosis Date  . Hypothyroidism   . Hypertension   . Rectum cancer 1986  . PC (prostate  cancer) 1987  . Renal cell cancer   . Osteoporosis   . Esophageal stricture   . Anemia   . CAD (coronary artery disease)     Inferior MI.  LAD mid 80% stenosis, circ mild plaque, RCA 100% with thrombectomy PTCA.  EF 65%  . CKD (chronic kidney disease)   . Myocardial infarction 03/12/13    Masoud Nyce-cardiologist    Past Surgical History  Procedure Laterality Date  . Kidney surgery  2004  . Radiofrequency ablation kidney  2006  . Hernia repair      2007  . Renal sphincter appliance  2007  . Colonoscopy  01/08/2012    Procedure: COLONOSCOPY;  Surgeon: Rogene Houston, MD;  Location: [facility];  Service: Endoscopy;  Laterality: N/A;  1030  . Colostomy Left 1986    colon cancer  . Percutaneous coronary intervention-balloon only    . Left heart catheterization with coronary angiogram N/A 03/12/2013    Procedure: STEMI;  Surgeon: Burnell Blanks, MD;  [facility] CATH LAB;  Service: Cardiovascular;  Laterality: N/A;    ROS:  As stated in the HPI and negative for all other systems.  PHYSICAL EXAM BP 145/89 mmHg  Pulse 59  Ht 5\' 6"  (1.676 m)  Wt 174  lb (78.926 kg)  BMI 28.10 kg/m2 GENERAL:  Well appearing, uncomfortable from his back NECK:  No jugular venous distention, waveform within normal limits, carotid upstroke brisk and symmetric, no bruits, no thyromegaly LYMPHATICS:  No cervical, inguinal adenopathy LUNGS:  Clear to auscultation bilaterally BACK:  No CVA tenderness, mild lordosis CHEST:  Unremarkable HEART:  PMI not displaced or sustained,S1 and S2 within normal limits, no S3, no S4, no clicks, no rubs, no murmurs ABD:  Flat, positive bowel sounds normal in frequency in pitch, no bruits, no rebound, no guarding, no midline pulsatile mass, no hepatomegaly, no splenomegaly, , colostomy bag in place EXT:  2 plus pulses throughout, no edema, no cyanosis no clubbing  EKG:  Sinus rhythm, rate 61,, left axis deviation, first degree AV block, no acute ST-T wave changes,  PVCs. 02/15/2015  ASSESSMENT AND PLAN  CAD: The patient has no new sypmtoms.  No further cardiovascular testing is indicated.  He is now 2 years from his acute MI. He is almost definitely going to need to have something done with his back disease in such severe pain. At the least to be taking pain medications possibly to include some nonsteroidals. I'm going to stop Plavix but new aspirin.  HTN:  His BP is slightly elevated today but he is in acute pain.  I will continue the meds as listed.   DYSLIPIDEMIA:  He had this checked in Feb and he was at [place] per Dr. Laurance Flatten.   AORTIC STENOSIS:  This is moderate and he will be followed clinically.   I would not suspect by exam or history that this is worse than previous.

## 2015-02-15 NOTE — Patient Instructions (Signed)
Medication Instructions:  Please stop your Plavix.  Continue all other medications as listed.  Labwork: None  Testing/Procedures: None  Follow-Up: Follow up in 1 year with Dr. Percival Spanish in [place].  You will receive a letter in the mail 2 months before you are due.  Please call us when you receive this letter to schedule your follow up appointment.  Thank you for choosing [facility]!!

## 2015-02-15 NOTE — Telephone Encounter (Signed)
I spoke with susan at [facility].

## 2015-02-16 DIAGNOSIS — M5136 Other intervertebral disc degeneration, lumbar region: Secondary | ICD-10-CM | POA: Diagnosis not present

## 2015-02-16 DIAGNOSIS — M5441 Lumbago with sciatica, right side: Secondary | ICD-10-CM | POA: Diagnosis not present

## 2015-02-24 DIAGNOSIS — M4316 Spondylolisthesis, lumbar region: Secondary | ICD-10-CM | POA: Diagnosis not present

## 2015-02-24 DIAGNOSIS — M5116 Intervertebral disc disorders with radiculopathy, lumbar region: Secondary | ICD-10-CM | POA: Diagnosis not present

## 2015-02-24 DIAGNOSIS — M5136 Other intervertebral disc degeneration, lumbar region: Secondary | ICD-10-CM | POA: Diagnosis not present

## 2015-03-02 DIAGNOSIS — D225 Melanocytic nevi of trunk: Secondary | ICD-10-CM | POA: Diagnosis not present

## 2015-03-02 DIAGNOSIS — L821 Other seborrheic keratosis: Secondary | ICD-10-CM | POA: Diagnosis not present

## 2015-03-02 DIAGNOSIS — Z85828 Personal history of other malignant neoplasm of skin: Secondary | ICD-10-CM | POA: Diagnosis not present

## 2015-03-02 DIAGNOSIS — L570 Actinic keratosis: Secondary | ICD-10-CM | POA: Diagnosis not present

## 2015-03-13 ENCOUNTER — Other Ambulatory Visit: Payer: Self-pay | Admitting: Family Medicine

## 2015-03-13 DIAGNOSIS — M5136 Other intervertebral disc degeneration, lumbar region: Secondary | ICD-10-CM | POA: Diagnosis not present

## 2015-03-15 ENCOUNTER — Other Ambulatory Visit (INDEPENDENT_AMBULATORY_CARE_PROVIDER_SITE_OTHER): Payer: Self-pay | Admitting: *Deleted

## 2015-03-15 DIAGNOSIS — Z860100 Personal history of colon polyps, unspecified: Secondary | ICD-10-CM

## 2015-03-15 DIAGNOSIS — Z85038 Personal history of other malignant neoplasm of large intestine: Secondary | ICD-10-CM

## 2015-04-12 DIAGNOSIS — Z8546 Personal history of malignant neoplasm of prostate: Secondary | ICD-10-CM | POA: Diagnosis not present

## 2015-04-12 DIAGNOSIS — Z87448 Personal history of other diseases of urinary system: Secondary | ICD-10-CM | POA: Diagnosis not present

## 2015-04-12 DIAGNOSIS — N390 Urinary tract infection, site not specified: Secondary | ICD-10-CM | POA: Diagnosis not present

## 2015-04-12 DIAGNOSIS — N289 Disorder of kidney and ureter, unspecified: Secondary | ICD-10-CM | POA: Diagnosis not present

## 2015-04-12 DIAGNOSIS — R319 Hematuria, unspecified: Secondary | ICD-10-CM | POA: Diagnosis not present

## 2015-04-12 DIAGNOSIS — N2889 Other specified disorders of kidney and ureter: Secondary | ICD-10-CM | POA: Diagnosis not present

## 2015-04-12 DIAGNOSIS — Z85528 Personal history of other malignant neoplasm of kidney: Secondary | ICD-10-CM | POA: Diagnosis not present

## 2015-04-18 ENCOUNTER — Telehealth (INDEPENDENT_AMBULATORY_CARE_PROVIDER_SITE_OTHER): Payer: Self-pay | Admitting: *Deleted

## 2015-04-18 MED ORDER — PEG 3350-KCL-NA BICARB-NACL 420 G PO SOLR: 4000.0000 mL | Freq: Once | ORAL | Status: DC

## 2015-04-18 NOTE — Telephone Encounter (Signed)
Patient needs trilyte 

## 2015-05-05 ENCOUNTER — Encounter: Payer: Self-pay | Admitting: Family Medicine

## 2015-05-05 ENCOUNTER — Ambulatory Visit (INDEPENDENT_AMBULATORY_CARE_PROVIDER_SITE_OTHER): Payer: Medicare Other | Admitting: Family Medicine

## 2015-05-05 VITALS — BP 147/89 | HR 57 | Temp 97.8°F | Ht 66.0 in | Wt 175.2 lb

## 2015-05-05 DIAGNOSIS — E785 Hyperlipidemia, unspecified: Secondary | ICD-10-CM | POA: Diagnosis not present

## 2015-05-05 DIAGNOSIS — I10 Essential (primary) hypertension: Secondary | ICD-10-CM

## 2015-05-05 DIAGNOSIS — I442 Atrioventricular block, complete: Secondary | ICD-10-CM

## 2015-05-05 DIAGNOSIS — E559 Vitamin D deficiency, unspecified: Secondary | ICD-10-CM | POA: Diagnosis not present

## 2015-05-05 DIAGNOSIS — N183 Chronic kidney disease, stage 3 unspecified: Secondary | ICD-10-CM

## 2015-05-05 DIAGNOSIS — Z933 Colostomy status: Secondary | ICD-10-CM

## 2015-05-05 DIAGNOSIS — M1990 Unspecified osteoarthritis, unspecified site: Secondary | ICD-10-CM | POA: Diagnosis not present

## 2015-05-05 DIAGNOSIS — I2510 Atherosclerotic heart disease of native coronary artery without angina pectoris: Secondary | ICD-10-CM

## 2015-05-05 DIAGNOSIS — D509 Iron deficiency anemia, unspecified: Secondary | ICD-10-CM | POA: Diagnosis not present

## 2015-05-05 LAB — POCT CBC
Granulocyte percent: 73.7 %G (ref 37–80)
HCT, POC: 38 % — AB (ref 43.5–53.7)
Hemoglobin: 12.2 g/dL — AB (ref 14.1–18.1)
LYMPH, POC: 2 (ref 0.6–3.4)
MCH, POC: 27.1 pg (ref 27–31.2)
MCHC: 32.1 g/dL (ref 31.8–35.4)
MCV: 84.3 fL (ref 80–97)
MPV: 6.8 fL (ref 0–99.8)
POC Granulocyte: 6.2 (ref 2–6.9)
POC LYMPH PERCENT: 24.4 %L (ref 10–50)
Platelet Count, POC: 265 10*3/uL (ref 142–424)
RBC: 4.51 M/uL — AB (ref 4.69–6.13)
RDW, POC: 14.1 %
WBC: 8.4 10*3/uL (ref 4.6–10.2)

## 2015-05-05 NOTE — Progress Notes (Signed)
Subjective:    Patient ID: Frederick Davis, male    DOB: 1922-11-16, 81 y.o.   MRN: [id number]  HPI Patient is here today for a follow up on chronic medical problems. Patient is also complaining with back pain and left knee pain. This patient has a history of prostate cancer rectal cancer and renal cell carcinoma. He is also had a significant heart attack and has recovered well from that. He does complain of arthralgias se his back and knee today. On his last BMP his creatinine was 1.33 and that was improved from the time before.  The patient denies chest pain shortness of breath trouble swallowing blood in the stool or black tarry bowel movements. He is also not having any problems passing his water. Once again his biggest issue is his severe pain in his left knee which is there is severe at times and almost unbearable. He has arrangements to see the orthopedist soon and orthopedist does not want to do any surgery on the knee.    Review of Systems  Constitutional: Negative.   HENT: Negative.   Eyes: Negative.   Respiratory: Negative.   Cardiovascular: Negative.   Gastrointestinal: Negative.   Endocrine: Negative.   Genitourinary: Negative.   Musculoskeletal: Positive for back pain.       Left knee pain   Skin: Negative.   Allergic/Immunologic: Negative.   Neurological: Negative.   Hematological: Negative.   Psychiatric/Behavioral: Negative.              Patient Active Problem List   Diagnosis Date Noted  . Rectal cancer 07/19/2013  . Hyperlipidemia 07/19/2013  . Hypotension arterial 03/18/2013  . Complete heart block 03/18/2013  . Anemia   . CKD (chronic kidney disease) stage 3, GFR 30-59 ml/min 03/12/2013  . Acute MI, inferior wall, initial episode of care 03/12/2013  . Impotence 03/12/2013  . History of renal stone 03/12/2013  . Hiatal hernia 03/12/2013  . H/O lithotripsy 03/12/2013  . SCCA (squamous cell carcinoma) of skin 03/12/2013  . S/P prostatectomy, radical  03/12/2013  . Left renal mass 03/12/2013  . Arthritis 03/12/2013  . ST elevation myocardial infarction (STEMI) of inferior wall 03/12/2013  . Coronary atherosclerosis of native coronary artery 03/12/2013  . Cardiogenic shock 03/12/2013  . Acute respiratory failure 03/12/2013  . Altered mental status 03/12/2013  . Essential hypertension, benign 01/14/2013  . Colostomy in place 01/14/2013  . Prostate cancer 01/14/2013  . Renal cell carcinoma, Right 01/14/2013  . Osteoporosis, unspecified 01/14/2013   Outpatient Encounter Prescriptions as of 05/05/2015  Medication Sig  . aspirin 81 MG tablet Take 1 tablet (81 mg total) by mouth daily.  Marland Kitchen Bioflavonoid Products (BIOFLEX) TABS Take 1 tablet by mouth daily.  . Calcium Carbonate-Vitamin D (CALCIUM + D PO) Take 1 tablet by mouth daily.  . Cholecalciferol (VITAMIN D3) 2000 UNITS TABS Take 2,000 Units by mouth daily.  . cyclobenzaprine (FLEXERIL) 10 MG tablet Take 1 tablet (10 mg total) by mouth 3 (three) times daily as needed for muscle spasms.  . ferrous gluconate (FERGON) 324 MG tablet Take 324 mg by mouth daily with breakfast.  . levothyroxine (SYNTHROID, LEVOTHROID) 75 MCG tablet TAKE 1 TABLET DAILY  . Multiple Vitamin (MULITIVITAMIN WITH MINERALS) TABS Take 1 tablet by mouth daily.  . nitroGLYCERIN (NITROSTAT) 0.4 MG SL tablet PLACE 1 TABLET UNDER THE TONGUE AT ONSET OF CHEST PAIN EVERY 5 MINTUES UP TO 3 TIMES AS NEEDED  . polyethylene glycol-electrolytes (NULYTELY/GOLYTELY) 420 G solution Take  4,000 mLs by mouth once.  . [DISCONTINUED] meloxicam (MOBIC) 7.5 MG tablet Take 1 tablet (7.5 mg total) by mouth daily. (Patient not taking: Reported on 02/15/2015)  . [DISCONTINUED] traMADol (ULTRAM) 50 MG tablet 1/2 tab QID PRN. (Patient not taking: Reported on 05/05/2015)   Facility-Administered Encounter Medications as of 05/05/2015  Medication  . etomidate (AMIDATE) injection  . lidocaine (cardiac) 100 mg/12m (XYLOCAINE) 20 MG/ML injection 2%  .  succinylcholine (ANECTINE) injection    Objective:   Physical Exam  Constitutional: He is oriented to person, place, and time. He appears well-developed and well-nourished.  The patient is doing well and looks much younger than his stated age of 89 His biggest issue is his left knee as the pain is almost incapacitating.  HENT:  Head: Normocephalic and atraumatic.  Right Ear: External ear normal.  Left Ear: External ear normal.  Nose: Nose normal.  Mouth/Throat: Oropharynx is clear and moist. No oropharyngeal exudate.  Eyes: Conjunctivae and EOM are normal. Pupils are equal, round, and reactive to light. Right eye exhibits no discharge. Left eye exhibits no discharge. No scleral icterus.  Neck: Normal range of motion. Neck supple. No thyromegaly present.  No bruits or thyromegaly  Cardiovascular: Normal rate, regular rhythm and intact distal pulses.  Exam reveals no gallop and no friction rub.   Murmur heard. Heart has a regular rate and rhythm at 60/m with a grade 2/6 systolic ejection murmur  Pulmonary/Chest: Effort normal and breath sounds normal. No respiratory distress. He has no wheezes. He has no rales. He exhibits no tenderness.  Clear anteriorly and posteriorly  Abdominal: Soft. Bowel sounds are normal. He exhibits no mass. There is no tenderness. There is no rebound and no guarding.  Colostomy in place. No abdominal tenderness or bruits  Musculoskeletal: Normal range of motion. He exhibits no edema or tenderness.  Lymphadenopathy:    He has no cervical adenopathy.  Neurological: He is alert and oriented to person, place, and time. He has normal reflexes. No cranial nerve deficit.  Skin: Skin is warm and dry. No rash noted. No erythema. No pallor.  Psychiatric: He has a normal mood and affect. His behavior is normal. Judgment and thought content normal.  Nursing note and vitals reviewed.  BP 147/89 mmHg  Pulse 57  Temp(Src) 97.8 F (36.6 C) (Oral)  Ht 5' 6" (1.676 m)  Wt  175 lb 3.2 oz (79.47 kg)  BMI 28.29 kg/m2        Assessment & Plan:  1. Essential hypertension, benign -The blood pressure is good today the patient should continue with current treatment - BMP8+EGFR  2. Hyperlipidemia -The patient is intolerant to statins and should continue with aggressive therapeutic lifestyle changes - NMR, lipoprofile  3. CKD (chronic kidney disease) stage 3, GFR 30-59 ml/min -His last creatinine was good although still elevated. No change in treatment today his blood pressure is stable. - POCT CBC - Hepatic function panel  4. Vitamin D deficiency -Continue with current vitamin D replacement pending results of lab work  5. Iron deficiency anemia -The patient should continue with his current iron replacement pending results of CBC  6. Arthritis -He has an appointment for follow-up with orthopedic surgeon regarding his left knee pain   7. Colostomy in place -The patient has an appointment for colonoscopy in August he should follow through with this.  8. Complete heart block -The patient continues to see the cardiologist regularly.  9. Atherosclerosis of native coronary artery of native heart without  angina pectoris -He is having no chest pain and he will continue seeing the cardiologist regularly.  No orders of the defined types were placed in this encounter.   Patient Instructions  Continue current medications. Continue good therapeutic lifestyle changes which include good diet and exercise. Fall precautions discussed with patient. If an FOBT was given today- please return it to our front desk. If you are over 35 years old - you may need Prevnar 33 or the adult Pneumonia vaccine.  Flu Shots will be available at our office starting mid- September. Please call and schedule a FLU CLINIC APPOINTMENT.                        Medicare Annual Wellness Visit  [facility] and the medical providers at [facility] strive to bring  you the best medical care.  In doing so we not only want to address your current medical conditions and concerns but also to detect new conditions early and prevent illness, disease and health-related problems.    Medicare offers a yearly Wellness Visit which allows our clinical staff to assess your need for preventative services including immunizations, lifestyle education, counseling to decrease risk of preventable diseases and screening for fall risk and other medical concerns.    This visit is provided free of charge (no copay) for all Medicare recipients. The clinical pharmacists at [facility] have begun to conduct these Wellness Visits which will also include a thorough review of all your medications.    As you primary medical provider recommend that you make an appointment for your Annual Wellness Visit if you have not done so already this year.  You may set up this appointment before you leave today or you may call back ([phone]) and schedule an appointment.  Please make sure when you call that you mention that you are scheduling your Annual Wellness Visit with the clinical pharmacist so that the appointment may be made for the proper length of time.     get appointment with orthopedist as soon as possible  Consider using a walker if necessary to keep herself from falling  Move slowly  Keep follow-up appointment for colonoscopy   Arrie Senate MD

## 2015-05-05 NOTE — Patient Instructions (Addendum)
Continue current medications. Continue good therapeutic lifestyle changes which include good diet and exercise. Fall precautions discussed with patient. If an FOBT was given today- please return it to our front desk. If you are over 79 years old - you may need Prevnar 37 or the adult Pneumonia vaccine.  Flu Shots will be available at our office starting mid- September. Please call and schedule a FLU CLINIC APPOINTMENT.                        Medicare Annual Wellness Visit  [facility] and the medical providers at [facility] strive to bring you the best medical care.  In doing so we not only want to address your current medical conditions and concerns but also to detect new conditions early and prevent illness, disease and health-related problems.    Medicare offers a yearly Wellness Visit which allows our clinical staff to assess your need for preventative services including immunizations, lifestyle education, counseling to decrease risk of preventable diseases and screening for fall risk and other medical concerns.    This visit is provided free of charge (no copay) for all Medicare recipients. The clinical pharmacists at [facility] have begun to conduct these Wellness Visits which will also include a thorough review of all your medications.    As you primary medical provider recommend that you make an appointment for your Annual Wellness Visit if you have not done so already this year.  You may set up this appointment before you leave today or you may call back ([phone]) and schedule an appointment.  Please make sure when you call that you mention that you are scheduling your Annual Wellness Visit with the clinical pharmacist so that the appointment may be made for the proper length of time.     get appointment with orthopedist as soon as possible  Consider using a walker if necessary to keep herself from falling  Move slowly  Keep follow-up  appointment for colonoscopy

## 2015-05-05 NOTE — Addendum Note (Signed)
Addended by: Selmer Dominion on: 05/05/2015 09:04 AM   Modules accepted: Orders

## 2015-05-08 ENCOUNTER — Telehealth (INDEPENDENT_AMBULATORY_CARE_PROVIDER_SITE_OTHER): Payer: Self-pay | Admitting: *Deleted

## 2015-05-08 LAB — BMP8+EGFR
BUN/Creatinine Ratio: 20 (ref 10–22)
BUN: 32 mg/dL (ref 10–36)
CALCIUM: 9.7 mg/dL (ref 8.6–10.2)
CHLORIDE: 102 mmol/L (ref 97–108)
CO2: 19 mmol/L (ref 18–29)
CREATININE: 1.58 mg/dL — AB (ref 0.76–1.27)
GFR calc Af Amer: 44 mL/min/{1.73_m2} — ABNORMAL LOW (ref >59)
GFR calc non Af Amer: 38 mL/min/{1.73_m2} — ABNORMAL LOW (ref >59)
Glucose: 85 mg/dL (ref 65–99)
Potassium: 5.1 mmol/L (ref 3.5–5.2)
SODIUM: 137 mmol/L (ref 134–144)

## 2015-05-08 LAB — NMR, LIPOPROFILE

## 2015-05-08 LAB — HEPATIC FUNCTION PANEL
ALT: 14 IU/L (ref 0–44)
AST: 22 IU/L (ref 0–40)
Albumin: 3.9 g/dL (ref 3.2–4.6)
Alkaline Phosphatase: 86 IU/L (ref 39–117)
BILIRUBIN, DIRECT: 0.09 mg/dL (ref 0.00–0.40)
Bilirubin Total: 0.3 mg/dL (ref 0.0–1.2)
TOTAL PROTEIN: 6.1 g/dL (ref 6.0–8.5)

## 2015-05-08 LAB — LIPID PANEL
CHOL/HDL RATIO: 2.5 ratio (ref 0.0–5.0)
CHOLESTEROL TOTAL: 173 mg/dL (ref 100–199)
HDL: 70 mg/dL (ref >39)
LDL Calculated: 80 mg/dL (ref 0–99)
Triglycerides: 114 mg/dL (ref 0–149)
VLDL CHOLESTEROL CAL: 23 mg/dL (ref 5–40)

## 2015-05-08 LAB — SPECIMEN STATUS REPORT

## 2015-05-08 NOTE — Telephone Encounter (Signed)
Referring MD/PCP: moore   Procedure: tcs  Reason/Indication:  Hx polyps, hx colon ca  Has patient had this procedure before?  Yes, 2013 -- epic  If so, when, by whom and where?    Is there a family history of colon cancer?  no  Who?  What age when diagnosed?    Is patient diabetic?   no      Does patient have prosthetic heart valve?  no  Do you have a pacemaker?  no  Has patient ever had endocarditis? no  Has patient had joint replacement within last 12 months?  no  Does patient tend to be constipated or take laxatives? no  Is patient on Coumadin, Plavix and/or Aspirin? yes  Medications: multi vit, osteo bioflex, calcium, iron vit d, levothyroxine 75 mg daily, asa 81 mg daily, tylenol prn, meclizine prn  Allergies: nkda  Medication Adjustment: iron 10 days & asa 2 days  Procedure date & time: 06/07/15 at 930

## 2015-05-09 LAB — LIPID PANEL
CHOL/HDL RATIO: 2.7 ratio (ref 0.0–5.0)
CHOLESTEROL TOTAL: 183 mg/dL (ref 100–199)
HDL: 69 mg/dL (ref >39)
LDL Calculated: 91 mg/dL (ref 0–99)
Triglycerides: 117 mg/dL (ref 0–149)
VLDL Cholesterol Cal: 23 mg/dL (ref 5–40)

## 2015-05-09 LAB — SPECIMEN STATUS REPORT

## 2015-05-10 NOTE — Telephone Encounter (Signed)
agree

## 2015-05-18 ENCOUNTER — Encounter: Payer: Self-pay | Admitting: *Deleted

## 2015-05-31 DIAGNOSIS — M1712 Unilateral primary osteoarthritis, left knee: Secondary | ICD-10-CM | POA: Diagnosis not present

## 2015-06-20 ENCOUNTER — Telehealth (INDEPENDENT_AMBULATORY_CARE_PROVIDER_SITE_OTHER): Payer: Self-pay | Admitting: *Deleted

## 2015-06-20 NOTE — Telephone Encounter (Signed)
Referring MD/PCP: moore   Procedure: tcs  Reason/Indication:  Hx polyps, hx colon ca  Has patient had this procedure before?  Yes, 2013 -- epic  If so, when, by whom and where?    Is there a family history of colon cancer?  no  Who?  What age when diagnosed?    Is patient diabetic?   no      Does patient have prosthetic heart valve?  no  Do you have a pacemaker?  no  Has patient ever had endocarditis? no  Has patient had joint replacement within last 12 months?  no  Does patient tend to be constipated or take laxatives? no  Does patient have a history of alcohol/drug use?   Is patient on Coumadin, Plavix and/or Aspirin? yes  Medications: asa 81 mg daily,, multi vit, osteo bioflex, calcium, iron, vit d, levothyroxine 75 mg daily, tylenol prn, meclizine prn  Allergies: nkda  Medication Adjustment: asa 2 days, iron 10 days  Procedure date & time: 07/20/15 at 200

## 2015-06-22 NOTE — Telephone Encounter (Signed)
agree

## 2015-07-20 ENCOUNTER — Encounter (HOSPITAL_COMMUNITY): Payer: Self-pay | Admitting: *Deleted

## 2015-07-20 ENCOUNTER — Encounter (HOSPITAL_COMMUNITY)
Admission: RE | Disposition: A | Discharge status: Home / Self Care | Payer: Self-pay | Source: Ambulatory Visit | Attending: Internal Medicine

## 2015-07-20 ENCOUNTER — Ambulatory Visit (HOSPITAL_COMMUNITY)
Admission: RE | Admit: 2015-07-20 | Discharge: 2015-07-20 | Disposition: A | Discharge status: Home / Self Care | Payer: Medicare Other | Source: Ambulatory Visit | Attending: Internal Medicine | Admitting: Internal Medicine

## 2015-07-20 DIAGNOSIS — Z933 Colostomy status: Secondary | ICD-10-CM | POA: Diagnosis not present

## 2015-07-20 DIAGNOSIS — Z1211 Encounter for screening for malignant neoplasm of colon: Secondary | ICD-10-CM | POA: Diagnosis not present

## 2015-07-20 DIAGNOSIS — K5730 Diverticulosis of large intestine without perforation or abscess without bleeding: Secondary | ICD-10-CM | POA: Insufficient documentation

## 2015-07-20 DIAGNOSIS — I2510 Atherosclerotic heart disease of native coronary artery without angina pectoris: Secondary | ICD-10-CM | POA: Diagnosis not present

## 2015-07-20 DIAGNOSIS — D120 Benign neoplasm of cecum: Secondary | ICD-10-CM | POA: Diagnosis not present

## 2015-07-20 DIAGNOSIS — Z85048 Personal history of other malignant neoplasm of rectum, rectosigmoid junction, and anus: Secondary | ICD-10-CM | POA: Diagnosis not present

## 2015-07-20 DIAGNOSIS — Z87891 Personal history of nicotine dependence: Secondary | ICD-10-CM | POA: Insufficient documentation

## 2015-07-20 DIAGNOSIS — Z8546 Personal history of malignant neoplasm of prostate: Secondary | ICD-10-CM | POA: Diagnosis not present

## 2015-07-20 DIAGNOSIS — N189 Chronic kidney disease, unspecified: Secondary | ICD-10-CM | POA: Diagnosis not present

## 2015-07-20 DIAGNOSIS — Z85528 Personal history of other malignant neoplasm of kidney: Secondary | ICD-10-CM | POA: Insufficient documentation

## 2015-07-20 DIAGNOSIS — Z9049 Acquired absence of other specified parts of digestive tract: Secondary | ICD-10-CM | POA: Diagnosis not present

## 2015-07-20 DIAGNOSIS — Z85038 Personal history of other malignant neoplasm of large intestine: Secondary | ICD-10-CM | POA: Insufficient documentation

## 2015-07-20 DIAGNOSIS — E039 Hypothyroidism, unspecified: Secondary | ICD-10-CM | POA: Insufficient documentation

## 2015-07-20 DIAGNOSIS — Z86010 Personal history of colonic polyps: Secondary | ICD-10-CM | POA: Diagnosis not present

## 2015-07-20 DIAGNOSIS — I129 Hypertensive chronic kidney disease with stage 1 through stage 4 chronic kidney disease, or unspecified chronic kidney disease: Secondary | ICD-10-CM | POA: Diagnosis not present

## 2015-07-20 HISTORY — PX: COLONOSCOPY: SHX5424

## 2015-07-20 SURGERY — COLONOSCOPY
Anesthesia: Moderate Sedation

## 2015-07-20 MED ORDER — MIDAZOLAM HCL 5 MG/5ML IJ SOLN
INTRAMUSCULAR | Status: DC
Start: 2015-07-20 — End: 2015-07-20
  Filled 2015-07-20: qty 10

## 2015-07-20 MED ORDER — MIDAZOLAM HCL 5 MG/5ML IJ SOLN
INTRAMUSCULAR | Status: DC | PRN
Start: 2015-07-20 — End: 2015-07-20
  Administered 2015-07-20 (×2): 1 mg via INTRAVENOUS

## 2015-07-20 MED ORDER — SODIUM CHLORIDE 0.9 % IV SOLN
INTRAVENOUS | Status: DC
  Administered 2015-07-20: 13:00:00 via INTRAVENOUS

## 2015-07-20 MED ORDER — SIMETHICONE 40 MG/0.6ML PO SUSP
ORAL | Status: DC | PRN
  Administered 2015-07-20: 14:00:00

## 2015-07-20 MED ORDER — MEPERIDINE HCL 50 MG/ML IJ SOLN
INTRAMUSCULAR | Status: AC
  Filled 2015-07-20: qty 1

## 2015-07-20 NOTE — Op Note (Signed)
COLONOSCOPY PROCEDURE REPORT  PATIENT:  Frederick Davis  MR#:  [id number] Birthdate:  16-Nov-1922, 81 y.o., male Endoscopist:  Dr. Rogene [place], MD Referred By:  Dr. Redge Gainer, MD Procedure Date: 07/20/2015  Procedure:   Colonoscopy  Indications: Patient is 80 year old Caucasian male who has history of colorectal carcinoma and underwent a when necessary 63. He continues to enjoy good health and is here for surveillance colonoscopy. His last exam was in March 2013 with removal of 2 small polyps. One was tubular adenoma and the other one was sessile serrated polyp.  Informed Consent:  The procedure and risks were reviewed with the patient and informed consent was obtained.  Medications:  Versed 2 mg IV  Description of procedure:  Patient was placed in supine position and digital examination of colostomy was performed. Scope was placed into colostomy advanced through: Into cecum.. The cecum was deeply intubated. These structures were well-seen and photographed for the record. From the level of the cecum and ileocecal valve, the scope was slowly and cautiously withdrawn. The mucosal surfaces were carefully surveyed utilizing scope tip to flexion to facilitate fold flattening as needed. The scope was was removed and colostomy connected to colostomy bag.  Findings:   Prep satisfactory. Few small diverticula at cecum and also scattered at descending and sigmoid colon. Three small polyps were removed from cecum. One was cold snared and the other two later via cold biopsy. One of these polyps was lost.   Therapeutic/Diagnostic Maneuvers Performed:  See above  Complications:  None  EBL: Minimal  Cecal Withdrawal Time:  15 minutes  Impression:  Examination completed to cecum via sigmoid colostomy. Three small cecal polyps removed as above. One was lost and the other two were submitted for histology. Cecal and left-sided colonic diverticulosis.  Recommendations:  Standard  instructions given. I will contact patient with biopsy results and further recommendations.  [name],[name]  07/20/2015 2:55 PM  CC: Dr. Redge Gainer, MD & Dr. Rayne Du ref. provider found

## 2015-07-20 NOTE — Discharge Instructions (Signed)
Resume usual medications including aspirin. High fiber diet. No driving for 24 hours. Physician will call with biopsy results.  Colonoscopy, Care After These instructions give you information on caring for yourself after your procedure. Your doctor may also give you more specific instructions. Call your doctor if you have any problems or questions after your procedure. HOME CARE  Do not drive for 24 hours.  Do not sign important papers or use machinery for 24 hours.  You may shower.  You may go back to your usual activities, but go slower for the first 24 hours.  Take rest breaks often during the first 24 hours.  Walk around or use warm packs on your belly (abdomen) if you have belly cramping or gas.  Drink enough fluids to keep your pee (urine) clear or pale yellow.  Resume your normal diet. Avoid heavy or fried foods.  Avoid drinking alcohol for 24 hours or as told by your doctor.  Only take medicines as told by your doctor. If a tissue sample (biopsy) was taken during the procedure:   Do not take aspirin or blood thinners for 7 days, or as told by your doctor.  Do not drink alcohol for 7 days, or as told by your doctor.  Eat soft foods for the first 24 hours. GET HELP IF: You still have a small amount of blood in your poop (stool) 2-3 days after the procedure. GET HELP RIGHT AWAY IF:  You have more than a small amount of blood in your poop.  You see clumps of tissue (blood clots) in your poop.  Your belly is puffy (swollen).  You feel sick to your stomach (nauseous) or throw up (vomit).  You have a fever.  You have belly pain that gets worse and medicine does not help. MAKE SURE YOU:  Understand these instructions.  Will watch your condition.  Will get help right away if you are not doing well or get worse. Document Released: 11/09/2010 Document Revised: 10/12/2013 Document Reviewed: 06/14/2013 [facility] Patient Information 2015 [place], [place]. This  information is not intended to replace advice given to you by your health care provider. Make sure you discuss any questions you have with your health care provider.  Colon Polyps Polyps are lumps of extra tissue growing inside the body. Polyps can grow in the large intestine (colon). Most colon polyps are noncancerous (benign). However, some colon polyps can become cancerous over time. Polyps that are larger than a pea may be harmful. To be safe, caregivers remove and test all polyps. CAUSES  Polyps form when mutations in the genes cause your cells to grow and divide even though no more tissue is needed. RISK FACTORS There are a number of risk factors that can increase your chances of getting colon polyps. They include:  Being older than 50 years.  Family history of colon polyps or colon cancer.  Long-term colon diseases, such as colitis or Crohn disease.  Being overweight.  Smoking.  Being inactive.  Drinking too much alcohol. SYMPTOMS  Most small polyps do not cause symptoms. If symptoms are present, they may include:  Blood in the stool. The stool may look dark red or black.  Constipation or diarrhea that lasts longer than 1 week. DIAGNOSIS People often do not know they have polyps until their caregiver finds them during a regular checkup. Your caregiver can use 4 tests to check for polyps:  Digital rectal exam. The caregiver wears gloves and feels inside the rectum. This test would find  polyps only in the rectum.  Barium enema. The caregiver puts a liquid called barium into your rectum before taking X-rays of your colon. Barium makes your colon look white. Polyps are dark, so they are easy to see in the X-ray pictures.  Sigmoidoscopy. A thin, flexible tube (sigmoidoscope) is placed into your rectum. The sigmoidoscope has a light and tiny camera in it. The caregiver uses the sigmoidoscope to look at the last third of your colon.  Colonoscopy. This test is like sigmoidoscopy,  but the caregiver looks at the entire colon. This is the most common method for finding and removing polyps. TREATMENT  Any polyps will be removed during a sigmoidoscopy or colonoscopy. The polyps are then tested for cancer. PREVENTION  To help lower your risk of getting more colon polyps:  Eat plenty of fruits and vegetables. Avoid eating fatty foods.  Do not smoke.  Avoid drinking alcohol.  Exercise every day.  Lose weight if recommended by your caregiver.  Eat plenty of calcium and folate. Foods that are rich in calcium include milk, cheese, and broccoli. Foods that are rich in folate include chickpeas, kidney beans, and spinach. HOME CARE INSTRUCTIONS Keep all follow-up appointments as directed by your caregiver. You may need periodic exams to check for polyps. SEEK MEDICAL CARE IF: You notice bleeding during a bowel movement. Document Released: 07/03/2004 Document Revised: 12/30/2011 Document Reviewed: 12/17/2011 [facility] Patient Information 2015 [place], [place]. This information is not intended to replace advice given to you by your health care provider. Make sure you discuss any questions you have with your health care provider.   High-Fiber Diet Fiber is found in fruits, vegetables, and grains. A high-fiber diet encourages the addition of more whole grains, legumes, fruits, and vegetables in your diet. The recommended amount of fiber for adult males is 38 g per day. For adult females, it is 25 g per day. Pregnant and lactating women should get 28 g of fiber per day. If you have a digestive or bowel problem, ask your caregiver for advice before adding high-fiber foods to your diet. Eat a variety of high-fiber foods instead of only a select few type of foods.  PURPOSE  To increase stool bulk.  To make bowel movements more regular to prevent constipation.  To lower cholesterol.  To prevent overeating. WHEN IS THIS DIET USED?  It may be used if you have constipation and  hemorrhoids.  It may be used if you have uncomplicated diverticulosis (intestine condition) and irritable bowel syndrome.  It may be used if you need help with weight management.  It may be used if you want to add it to your diet as a protective measure against atherosclerosis, diabetes, and cancer. SOURCES OF FIBER  Whole-grain breads and cereals.  Fruits, such as apples, oranges, bananas, berries, prunes, and pears.  Vegetables, such as green peas, carrots, sweet potatoes, beets, broccoli, cabbage, spinach, and artichokes.  Legumes, such split peas, soy, lentils.  Almonds. FIBER CONTENT IN FOODS Starches and Grains / Dietary Fiber (g)  Cheerios, 1 cup / 3 g  Corn Flakes cereal, 1 cup / 0.7 g  Rice crispy treat cereal, 1 cup / 0.3 g  Instant oatmeal (cooked),  cup / 2 g  Frosted wheat cereal, 1 cup / 5.1 g  Brown, long-grain rice (cooked), 1 cup / 3.5 g  White, long-grain rice (cooked), 1 cup / 0.6 g  Enriched macaroni (cooked), 1 cup / 2.5 g Legumes / Dietary Fiber (g)  Baked beans (  canned, plain, or vegetarian),  cup / 5.2 g  Kidney beans (canned),  cup / 6.8 g  Pinto beans (cooked),  cup / 5.5 g Breads and Crackers / Dietary Fiber (g)  Plain or honey graham crackers, 2 squares / 0.7 g  Saltine crackers, 3 squares / 0.3 g  Plain, salted pretzels, 10 pieces / 1.8 g  Whole-wheat bread, 1 slice / 1.9 g  White bread, 1 slice / 0.7 g  Raisin bread, 1 slice / 1.2 g  Plain bagel, 3 oz / 2 g  Flour tortilla, 1 oz / 0.9 g  Corn tortilla, 1 small / 1.5 g  Hamburger or hotdog bun, 1 small / 0.9 g Fruits / Dietary Fiber (g)  Apple with skin, 1 medium / 4.4 g  Sweetened applesauce,  cup / 1.5 g  Banana,  medium / 1.5 g  Grapes, 10 grapes / 0.4 g  Orange, 1 small / 2.3 g  Raisin, 1.5 oz / 1.6 g  Melon, 1 cup / 1.4 g Vegetables / Dietary Fiber (g)  Green beans (canned),  cup / 1.3 g  Carrots (cooked),  cup / 2.3 g  Broccoli (cooked),   cup / 2.8 g  Peas (cooked),  cup / 4.4 g  Mashed potatoes,  cup / 1.6 g  Lettuce, 1 cup / 0.5 g  Corn (canned),  cup / 1.6 g  Tomato,  cup / 1.1 g Document Released: 10/07/2005 Document Revised: 04/07/2012 Document Reviewed: 01/09/2012 [employer] Patient Information 2015 [place], [place]. This information is not intended to replace advice given to you by your health care provider. Make sure you discuss any questions you have with your health care provider.

## 2015-07-20 NOTE — H&P (Signed)
Frederick Davis is an 81 y.o. male.   Chief Complaint: Patient is here for colonoscopy. HPI: Patient is 81 year-old Caucasian male who has history of colon as well as prostate and renal cell carcinoma was here for surveillance colonoscopy. Last colonoscopy was in March 2013 with removal of 2 cecal polyps. One was tubular adenoma and the other one was sessile serrated polyp. Patient denies abdominal pain melena or bleeding into colostomy. Family history is negative for CRC.  Past Medical History  Diagnosis Date  . Hypothyroidism   . Hypertension   . Rectum cancer 1986  . PC (prostate cancer) 1987  . Renal cell cancer   . Osteoporosis   . Esophageal stricture   . Anemia   . CAD (coronary artery disease)     Inferior MI.  LAD mid 80% stenosis, circ mild plaque, RCA 100% with thrombectomy PTCA.  EF 65%  . CKD (chronic kidney disease)   . Myocardial infarction 03/12/13    hochrein-cardiologist    Past Surgical History  Procedure Laterality Date  . Kidney surgery  2004  . Radiofrequency ablation kidney  2006  . Hernia repair      2007  . Renal sphincter appliance  2007  . Colonoscopy  01/08/2012    Procedure: COLONOSCOPY;  Surgeon: Rogene Houston, MD;  Location: [facility];  Service: Endoscopy;  Laterality: N/A;  1030  . Colostomy Left 1986    colon cancer  . Percutaneous coronary intervention-balloon only    . Left heart catheterization with coronary angiogram N/A 03/12/2013    Procedure: STEMI;  Surgeon: Burnell Blanks, MD;  Location: [facility] CATH LAB;  Service: Cardiovascular;  Laterality: N/A;  . Prostatectomy    . Right inguinal hernia repair      Family History  Problem Relation Age of Onset  . Stroke Mother 64  . AAA (abdominal aortic aneurysm) Father 62    died from rupture at 79 yo   Social History:  reports that he quit smoking about 46 years ago. His smoking use included Cigarettes. He started smoking about 68 years ago. He smoked 0.50 packs per day. He does  not have any smokeless tobacco history on file. He reports that he drinks about 0.5 oz of alcohol per week. He reports that he does not use illicit drugs.  Allergies:  Allergies  Allergen Reactions  . Lipitor [Atorvastatin] Other (See Comments)    Muscle aches    Medications Prior to Admission  Medication Sig Dispense Refill  . aspirin 81 MG tablet Take 1 tablet (81 mg total) by mouth daily. 30 tablet   . Bioflavonoid Products (BIOFLEX) TABS Take 1 tablet by mouth daily.    . Calcium Carbonate-Vitamin D (CALCIUM + D PO) Take 1 tablet by mouth daily.    . Cholecalciferol (VITAMIN D3) 2000 UNITS TABS Take 2,000 Units by mouth daily.    . ferrous gluconate (FERGON) 324 MG tablet Take 324 mg by mouth daily with breakfast.    . levothyroxine (SYNTHROID, LEVOTHROID) 75 MCG tablet TAKE 1 TABLET DAILY 90 tablet 2  . Multiple Vitamin (MULITIVITAMIN WITH MINERALS) TABS Take 1 tablet by mouth daily.    . polyethylene glycol-electrolytes (NULYTELY/GOLYTELY) 420 G solution Take 4,000 mLs by mouth once. 4000 mL 0  . cyclobenzaprine (FLEXERIL) 10 MG tablet Take 1 tablet (10 mg total) by mouth 3 (three) times daily as needed for muscle spasms. 60 tablet 1  . nitroGLYCERIN (NITROSTAT) 0.4 MG SL tablet PLACE 1 TABLET UNDER  THE TONGUE AT ONSET OF CHEST PAIN EVERY 5 MINTUES UP TO 3 TIMES AS NEEDED 25 tablet 11    No results found for this or any previous visit (from the past 48 hour(s)). No results found.  ROS  Blood pressure 152/97, pulse 81, temperature 98 F (36.7 C), temperature source Oral, resp. rate 10, height 5' 6.5" (1.689 m), weight 170 lb (77.111 kg), SpO2 98 %. Physical Exam  Constitutional: He appears well-developed and well-nourished.  HENT:  Mouth/Throat: Oropharynx is clear and moist.  Eyes: Conjunctivae are normal. No scleral icterus.  Neck: No thyromegaly present.  Cardiovascular: Normal rate, regular rhythm and normal heart sounds.   No murmur heard. Respiratory: Effort normal  and breath sounds normal.  GI:  Patient has colostomy in left lower quadrant. His abdomen. Abdomen is soft and nontender without organomegaly or masses.  Musculoskeletal: He exhibits no edema.  Lymphadenopathy:    He has no cervical adenopathy.  Neurological: He is alert.  Skin: Skin is warm and dry.     Assessment/Plan History of colorectal carcinoma and colonic polyps. Surveillance colonoscopy.  [name],[name] 07/20/2015, 2:17 PM

## 2015-07-24 ENCOUNTER — Encounter (HOSPITAL_COMMUNITY): Payer: Self-pay | Admitting: Internal Medicine

## 2015-08-30 DIAGNOSIS — L570 Actinic keratosis: Secondary | ICD-10-CM | POA: Diagnosis not present

## 2015-08-30 DIAGNOSIS — L821 Other seborrheic keratosis: Secondary | ICD-10-CM | POA: Diagnosis not present

## 2015-08-30 DIAGNOSIS — B0089 Other herpesviral infection: Secondary | ICD-10-CM | POA: Diagnosis not present

## 2015-08-30 DIAGNOSIS — D225 Melanocytic nevi of trunk: Secondary | ICD-10-CM | POA: Diagnosis not present

## 2015-08-30 DIAGNOSIS — Z85828 Personal history of other malignant neoplasm of skin: Secondary | ICD-10-CM | POA: Diagnosis not present

## 2015-09-01 DIAGNOSIS — M1712 Unilateral primary osteoarthritis, left knee: Secondary | ICD-10-CM | POA: Diagnosis not present

## 2015-09-20 ENCOUNTER — Other Ambulatory Visit (INDEPENDENT_AMBULATORY_CARE_PROVIDER_SITE_OTHER): Payer: Medicare Other

## 2015-09-20 DIAGNOSIS — E559 Vitamin D deficiency, unspecified: Secondary | ICD-10-CM

## 2015-09-20 DIAGNOSIS — I10 Essential (primary) hypertension: Secondary | ICD-10-CM | POA: Diagnosis not present

## 2015-09-20 DIAGNOSIS — E785 Hyperlipidemia, unspecified: Secondary | ICD-10-CM

## 2015-09-20 DIAGNOSIS — D509 Iron deficiency anemia, unspecified: Secondary | ICD-10-CM | POA: Diagnosis not present

## 2015-09-21 LAB — BMP8+EGFR
BUN / CREAT RATIO: 19 (ref 10–22)
BUN: 28 mg/dL (ref 10–36)
CO2: 22 mmol/L (ref 18–29)
CREATININE: 1.5 mg/dL — AB (ref 0.76–1.27)
Calcium: 10 mg/dL (ref 8.6–10.2)
Chloride: 107 mmol/L — ABNORMAL HIGH (ref 97–106)
GFR calc Af Amer: 47 mL/min/{1.73_m2} — ABNORMAL LOW (ref >59)
GFR, EST NON AFRICAN AMERICAN: 40 mL/min/{1.73_m2} — AB (ref >59)
GLUCOSE: 88 mg/dL (ref 65–99)
POTASSIUM: 4.9 mmol/L (ref 3.5–5.2)
SODIUM: 143 mmol/L (ref 136–144)

## 2015-09-21 LAB — CBC WITH DIFFERENTIAL/PLATELET
BASOS: 1 %
Basophils Absolute: 0.1 10*3/uL (ref 0.0–0.2)
EOS (ABSOLUTE): 0.4 10*3/uL (ref 0.0–0.4)
EOS: 5 %
HEMATOCRIT: 36.8 % — AB (ref 37.5–51.0)
Hemoglobin: 11.8 g/dL — ABNORMAL LOW (ref 12.6–17.7)
Immature Grans (Abs): 0 10*3/uL (ref 0.0–0.1)
Immature Granulocytes: 0 %
LYMPHS ABS: 1.7 10*3/uL (ref 0.7–3.1)
Lymphs: 22 %
MCH: 27.4 pg (ref 26.6–33.0)
MCHC: 32.1 g/dL (ref 31.5–35.7)
MCV: 86 fL (ref 79–97)
MONOS ABS: 0.6 10*3/uL (ref 0.1–0.9)
Monocytes: 8 %
Neutrophils Absolute: 4.7 10*3/uL (ref 1.4–7.0)
Neutrophils: 64 %
Platelets: 249 10*3/uL (ref 150–379)
RBC: 4.3 x10E6/uL (ref 4.14–5.80)
RDW: 14.4 % (ref 12.3–15.4)
WBC: 7.4 10*3/uL (ref 3.4–10.8)

## 2015-09-21 LAB — LIPID PANEL
CHOL/HDL RATIO: 2.2 ratio (ref 0.0–5.0)
CHOLESTEROL TOTAL: 149 mg/dL (ref 100–199)
HDL: 67 mg/dL (ref >39)
LDL Calculated: 64 mg/dL (ref 0–99)
TRIGLYCERIDES: 88 mg/dL (ref 0–149)
VLDL Cholesterol Cal: 18 mg/dL (ref 5–40)

## 2015-09-21 LAB — HEPATIC FUNCTION PANEL
ALT: 14 IU/L (ref 0–44)
AST: 18 IU/L (ref 0–40)
Albumin: 4 g/dL (ref 3.2–4.6)
Alkaline Phosphatase: 78 IU/L (ref 39–117)
Bilirubin Total: 0.4 mg/dL (ref 0.0–1.2)
Bilirubin, Direct: 0.14 mg/dL (ref 0.00–0.40)
Total Protein: 6 g/dL (ref 6.0–8.5)

## 2015-09-21 LAB — VITAMIN D 25 HYDROXY (VIT D DEFICIENCY, FRACTURES): Vit D, 25-Hydroxy: 47.7 ng/mL (ref 30.0–100.0)

## 2015-09-27 ENCOUNTER — Ambulatory Visit (INDEPENDENT_AMBULATORY_CARE_PROVIDER_SITE_OTHER): Payer: Medicare Other

## 2015-09-27 ENCOUNTER — Ambulatory Visit (INDEPENDENT_AMBULATORY_CARE_PROVIDER_SITE_OTHER): Payer: Medicare Other | Admitting: Family Medicine

## 2015-09-27 ENCOUNTER — Encounter: Payer: Self-pay | Admitting: Family Medicine

## 2015-09-27 VITALS — BP 163/91 | HR 55 | Temp 96.9°F | Ht 66.0 in | Wt 176.0 lb

## 2015-09-27 DIAGNOSIS — Z23 Encounter for immunization: Secondary | ICD-10-CM | POA: Diagnosis not present

## 2015-09-27 DIAGNOSIS — C61 Malignant neoplasm of prostate: Secondary | ICD-10-CM | POA: Diagnosis not present

## 2015-09-27 DIAGNOSIS — I2510 Atherosclerotic heart disease of native coronary artery without angina pectoris: Secondary | ICD-10-CM

## 2015-09-27 DIAGNOSIS — I10 Essential (primary) hypertension: Secondary | ICD-10-CM

## 2015-09-27 DIAGNOSIS — E559 Vitamin D deficiency, unspecified: Secondary | ICD-10-CM

## 2015-09-27 DIAGNOSIS — D509 Iron deficiency anemia, unspecified: Secondary | ICD-10-CM | POA: Diagnosis not present

## 2015-09-27 DIAGNOSIS — I2119 ST elevation (STEMI) myocardial infarction involving other coronary artery of inferior wall: Secondary | ICD-10-CM | POA: Diagnosis not present

## 2015-09-27 DIAGNOSIS — N183 Chronic kidney disease, stage 3 unspecified: Secondary | ICD-10-CM

## 2015-09-27 DIAGNOSIS — C641 Malignant neoplasm of right kidney, except renal pelvis: Secondary | ICD-10-CM

## 2015-09-27 DIAGNOSIS — E785 Hyperlipidemia, unspecified: Secondary | ICD-10-CM | POA: Diagnosis not present

## 2015-09-27 DIAGNOSIS — N2889 Other specified disorders of kidney and ureter: Secondary | ICD-10-CM

## 2015-09-27 NOTE — Patient Instructions (Addendum)
Medicare Annual Wellness Visit  [facility] and the medical providers at [facility] strive to bring you the best medical care.  In doing so we not only want to address your current medical conditions and concerns but also to detect new conditions early and prevent illness, disease and health-related problems.    Medicare offers a yearly Wellness Visit which allows our clinical staff to assess your need for preventative services including immunizations, lifestyle education, counseling to decrease risk of preventable diseases and screening for fall risk and other medical concerns.    This visit is provided free of charge (no copay) for all Medicare recipients. The clinical pharmacists at [facility] have begun to conduct these Wellness Visits which will also include a thorough review of all your medications.    As you primary medical provider recommend that you make an appointment for your Annual Wellness Visit if you have not done so already this year.  You may set up this appointment before you leave today or you may call back [phone] and schedule an appointment.  Please make sure when you call that you mention that you are scheduling your Annual Wellness Visit with the clinical pharmacist so that the appointment may be made for the proper length of time.     Continue current medications. Continue good therapeutic lifestyle changes which include good diet and exercise. Fall precautions discussed with patient. If an FOBT was given today- please return it to our front desk. If you are over 64 years old - you may need Prevnar 60 or the adult Pneumonia vaccine.  **Flu shots are available--- please call and schedule a FLU-CLINIC appointment**  After your visit with [place] today you will receive a survey in the mail or online from [employer] regarding your care with [place]. Please take a moment to fill this out. Your feedback is very  important to [place] as you can help [place] better understand your patient needs as well as improve your experience and satisfaction. WE CARE ABOUT YOU!!!   This winter drink plenty of fluids and stay well hydrated Use the Debrox eardrops 3 or 4 drops to each ear canal nightly for 3 nights wait 1 week and repeat and return to clinic after that weren't ear irrigation Try Zantac 150 twice daily before breakfast and supper for one month to see if this helps the swallowing issues. If the swallowing issues get worse we will need to make an appointment with the gastroenterologist for further evaluation Return the FOBT Continue to follow up with him see [facility] regarding the renal mass and your renal cell carcinoma Continue to follow up with the cardiologist because of your heart disease

## 2015-09-27 NOTE — Progress Notes (Signed)
Subjective:    Patient ID: Frederick Davis, male    DOB: 02/12/1923, 81 y.o.   MRN: [id number]  HPI Pt here for follow up and management of chronic medical problems which includes hypertension and hyperlipidemia. He is taking medications regularly. The patient has had multiple health problems but is doing extremely well considering all of these. These include a heart disease renal cell carcinoma prostate cancer and chronic kidney disease. He also has a history of hypertension. The patient is doing well overall. His recent lab work was reviewed with him and everything was good other than his creatinine being elevated but was not as high as it was the last check. He goes to [facility] routinely for his left renal mass and history of right renal cell carcinoma. He sees the cardiologist yearly because of his heart disease. He denies any chest pain shortness of breath heartburn and indigestion nausea vomiting diarrhea blood in the stool or black tarry bowel movements. He is passing his water without problems. He does note trouble swallowing and says this is only on occasion and he thinks is mostly with solid food. He does have some trouble hearing. He is also actively involved with watching his diet and plans to enroll in a local exercise therapy program with a patient assisted therapist. He will get his flu shot today.      Patient Active Problem List   Diagnosis Date Noted  . Rectal cancer ([place]) 07/19/2013  . Hyperlipidemia 07/19/2013  . Hypotension arterial 03/18/2013  . Complete heart block ([place]) 03/18/2013  . Anemia   . CKD (chronic kidney disease) stage 3, GFR 30-59 ml/min 03/12/2013  . Acute MI, inferior wall, initial episode of care ([place]) 03/12/2013  . Impotence 03/12/2013  . History of renal stone 03/12/2013  . Hiatal hernia 03/12/2013  . H/O lithotripsy 03/12/2013  . SCCA (squamous cell carcinoma) of skin 03/12/2013  . S/P prostatectomy, radical 03/12/2013  .  Left renal mass 03/12/2013  . Arthritis 03/12/2013  . ST elevation myocardial infarction (STEMI) of inferior wall ([place]) 03/12/2013  . Coronary atherosclerosis of native coronary artery 03/12/2013  . Cardiogenic shock ([place]) 03/12/2013  . Acute respiratory failure ([place]) 03/12/2013  . Altered mental status 03/12/2013  . Essential hypertension, benign 01/14/2013  . Colostomy in place [facility]) 01/14/2013  . Prostate cancer ([place]) 01/14/2013  . Renal cell carcinoma, Right 01/14/2013  . Osteoporosis, unspecified 01/14/2013   Outpatient Encounter Prescriptions as of 09/27/2015  Medication Sig  . aspirin 81 MG tablet Take 1 tablet (81 mg total) by mouth daily.  [place] Bioflavonoid Products (BIOFLEX) TABS Take 1 tablet by mouth daily.  . Cholecalciferol (VITAMIN D3) 2000 UNITS TABS Take 2,000 Units by mouth daily.  . ferrous gluconate (FERGON) 324 MG tablet Take 324 mg by mouth daily with breakfast.  . levothyroxine (SYNTHROID, LEVOTHROID) 75 MCG tablet TAKE 1 TABLET DAILY  . Multiple Vitamin (MULITIVITAMIN WITH MINERALS) TABS Take 1 tablet by mouth daily.  . [DISCONTINUED] Calcium Carbonate-Vitamin D (CALCIUM + D PO) Take 1 tablet by mouth daily.  . cyclobenzaprine (FLEXERIL) 10 MG tablet Take 1 tablet (10 mg total) by mouth 3 (three) times daily as needed for muscle spasms. (Patient not taking: Reported on 09/27/2015)  . nitroGLYCERIN (NITROSTAT) 0.4 MG SL tablet PLACE 1 TABLET UNDER THE TONGUE AT ONSET OF CHEST PAIN EVERY 5 MINTUES UP TO 3 TIMES AS NEEDED (Patient not taking: Reported on 09/27/2015)   Facility-Administered Encounter Medications as of 09/27/2015  Medication  .  etomidate (AMIDATE) injection  . lidocaine (cardiac) 100 mg/10ml (XYLOCAINE) 20 MG/ML injection 2%  . succinylcholine (ANECTINE) injection      Review of Systems  Constitutional: Negative.   HENT: Negative.   Eyes: Negative.   Respiratory: Negative.   Cardiovascular: Negative.   Gastrointestinal: Negative.        Trouble  swallowing at times  Endocrine: Negative.   Genitourinary: Negative.   Musculoskeletal: Negative.   Skin: Negative.   Allergic/Immunologic: Negative.   Neurological: Negative.   Hematological: Negative.   Psychiatric/Behavioral: Negative.        Objective:   Physical Exam  Constitutional: He is oriented to person, place, and time. He appears well-developed and well-nourished. No distress.  The patien is alert and looks good for his age of 81 years  HENT:  Head: Normocephalic and atraumatic.  Right Ear: External ear normal.  Mouth/Throat: Oropharynx is clear and moist. No oropharyngeal exudate.  He has some mild nasal congestion left greater than right. He has bilateral ears cerumen left greater than right.  Eyes: Conjunctivae and EOM are normal. Pupils are equal, round, and reactive to light. Right eye exhibits no discharge. Left eye exhibits no discharge. No scleral icterus.  Neck: Normal range of motion. Neck supple. No thyromegaly present.  No thyromegaly or anterior cervical adenopathy  Cardiovascular: Normal rate, regular rhythm and intact distal pulses.  Exam reveals no gallop and no friction rub.   Murmur heard. The heart is regular at 60/m with a grade 3/6 systolic ejection murmur  Pulmonary/Chest: Effort normal and breath sounds normal. No respiratory distress. He has no wheezes. He has no rales. He exhibits no tenderness.  Clear anteriorly and posteriorly  Abdominal: Soft. Bowel sounds are normal. He exhibits no mass. There is no tenderness. There is no rebound and no guarding.  The patient has his colostomy in place and this is doing well there is no liver or spleen enlargement and no inguinal adenopathy  Musculoskeletal: Normal range of motion. He exhibits tenderness. He exhibits no edema.  Patient has continued stiffness because of severe arthritis in both knees and understands he needs to be very careful to prevent falls and future fractures.  Lymphadenopathy:    He  has no cervical adenopathy.  Neurological: He is alert and oriented to person, place, and time. He has normal reflexes. No cranial nerve deficit.  Skin: Skin is warm and dry. No rash noted.  Psychiatric: He has a normal mood and affect. His behavior is normal. Judgment and thought content normal.  Nursing note and vitals reviewed.  BP 153/98 mmHg  Pulse 55  Temp(Src) 96.9 F (36.1 C) (Oral)  Ht 5\' 6"  (1.676 m)  Wt 176 lb (79.833 kg)  BMI 28.42 kg/m2  The blood pressure was rechecked and lower numbers were obtained but not numbers that were at goal and he will bring readings back from home in a couple weeks when he gets his ears irrigated and we will recheck the blood pressure at that time      Assessment & Plan:  1. Essential hypertension, benign -The blood pressure is elevated today and on recheck it was somewhat better. Because of his age we will not change the treatment but he will do some more readings at home after watching his sodium intake and will return to the office with these home readings in 2 weeks and the nurse will recheck the blood pressure will review the readings at that time. - DG Chest 2 View; Future  2. Hyperlipidemia -All his cholesterol numbers on his lab work were good and he will continue with aggressive therapeutic lifestyle changes  3. CKD (chronic kidney disease) stage 3, GFR 30-59 ml/min -His renal function is stable he knows to continue to avoid NSAIDs and watch sodium intake  4. Vitamin D deficiency -The vitamin D level was good he will continue with current treatment  5. Iron deficiency anemia -His hemoglobin was still slightly decreased but stable and consistent with past readings.  6. ST elevation myocardial infarction (STEMI) of inferior wall ([place]) -He has had no problems with chest pain or tightness and we'll continue to follow with the cardiologist on a yearly basis  7. Renal cell carcinoma, right [facility]) -He will continue to follow-up with  his oncologist/nephrologist at [facility]  8. Prostate cancer [facility]) -Continue to follow-up with urology  9. Left renal mass -Continue to follow-up with [facility]  Patient Instructions                       Medicare Annual Wellness Visit  [facility] and the medical providers at [facility] strive to bring you the best medical care.  In doing so we not only want to address your current medical conditions and concerns but also to detect new conditions early and prevent illness, disease and health-related problems.    Medicare offers a yearly Wellness Visit which allows our clinical staff to assess your need for preventative services including immunizations, lifestyle education, counseling to decrease risk of preventable diseases and screening for fall risk and other medical concerns.    This visit is provided free of charge (no copay) for all Medicare recipients. The clinical pharmacists at [facility] have begun to conduct these Wellness Visits which will also include a thorough review of all your medications.    As you primary medical provider recommend that you make an appointment for your Annual Wellness Visit if you have not done so already this year.  You may set up this appointment before you leave today or you may call back [phone]) and schedule an appointment.  Please make sure when you call that you mention that you are scheduling your Annual Wellness Visit with the clinical pharmacist so that the appointment may be made for the proper length of time.     Continue current medications. Continue good therapeutic lifestyle changes which include good diet and exercise. Fall precautions discussed with patient. If an FOBT was given today- please return it to our front desk. If you are over 58 years old - you may need Prevnar 39 or the adult Pneumonia vaccine.  **Flu shots are available--- please call and schedule  a FLU-CLINIC appointment**  After your visit with [place] today you will receive a survey in the mail or online from [employer] regarding your care with [place]. Please take a moment to fill this out. Your feedback is very important to [place] as you can help [place] better understand your patient needs as well as improve your experience and satisfaction. WE CARE ABOUT YOU!!!   This winter drink plenty of fluids and stay well hydrated Use the Debrox eardrops 3 or 4 drops to each ear canal nightly for 3 nights wait 1 week and repeat and return to clinic after that weren't ear irrigation Try Zantac 150 twice daily before breakfast and supper for one month to see if this helps the swallowing issues. If the swallowing issues get  worse we will need to make an appointment with the gastroenterologist for further evaluation Return the FOBT Continue to follow up with him see [facility] regarding the renal mass and your renal cell carcinoma Continue to follow up with the cardiologist because of your heart disease   Arrie Senate MD

## 2015-09-28 ENCOUNTER — Ambulatory Visit: Payer: Medicare Other | Admitting: Family Medicine

## 2015-10-17 ENCOUNTER — Ambulatory Visit (INDEPENDENT_AMBULATORY_CARE_PROVIDER_SITE_OTHER): Payer: Medicare Other | Admitting: Family Medicine

## 2015-10-17 ENCOUNTER — Observation Stay (HOSPITAL_COMMUNITY)
Admission: EM | Admit: 2015-10-17 | Discharge: 2015-10-18 | Disposition: A | Discharge status: Home / Self Care | Payer: Medicare Other | Attending: Cardiology | Admitting: Cardiology

## 2015-10-17 ENCOUNTER — Emergency Department (HOSPITAL_COMMUNITY): Payer: Medicare Other

## 2015-10-17 ENCOUNTER — Encounter (HOSPITAL_COMMUNITY): Payer: Self-pay | Admitting: Emergency Medicine

## 2015-10-17 VITALS — BP 126/74 | HR 73 | Temp 96.6°F | Ht 66.0 in | Wt 176.0 lb

## 2015-10-17 DIAGNOSIS — Z9861 Coronary angioplasty status: Secondary | ICD-10-CM | POA: Insufficient documentation

## 2015-10-17 DIAGNOSIS — N189 Chronic kidney disease, unspecified: Secondary | ICD-10-CM | POA: Diagnosis not present

## 2015-10-17 DIAGNOSIS — R079 Chest pain, unspecified: Principal | ICD-10-CM | POA: Insufficient documentation

## 2015-10-17 DIAGNOSIS — Z85048 Personal history of other malignant neoplasm of rectum, rectosigmoid junction, and anus: Secondary | ICD-10-CM | POA: Diagnosis not present

## 2015-10-17 DIAGNOSIS — C61 Malignant neoplasm of prostate: Secondary | ICD-10-CM | POA: Diagnosis not present

## 2015-10-17 DIAGNOSIS — K222 Esophageal obstruction: Secondary | ICD-10-CM | POA: Diagnosis not present

## 2015-10-17 DIAGNOSIS — E875 Hyperkalemia: Secondary | ICD-10-CM | POA: Diagnosis not present

## 2015-10-17 DIAGNOSIS — I2510 Atherosclerotic heart disease of native coronary artery without angina pectoris: Secondary | ICD-10-CM

## 2015-10-17 DIAGNOSIS — Z8546 Personal history of malignant neoplasm of prostate: Secondary | ICD-10-CM | POA: Insufficient documentation

## 2015-10-17 DIAGNOSIS — D649 Anemia, unspecified: Secondary | ICD-10-CM | POA: Insufficient documentation

## 2015-10-17 DIAGNOSIS — M810 Age-related osteoporosis without current pathological fracture: Secondary | ICD-10-CM | POA: Diagnosis not present

## 2015-10-17 DIAGNOSIS — E039 Hypothyroidism, unspecified: Secondary | ICD-10-CM | POA: Insufficient documentation

## 2015-10-17 DIAGNOSIS — I440 Atrioventricular block, first degree: Secondary | ICD-10-CM | POA: Diagnosis not present

## 2015-10-17 DIAGNOSIS — I252 Old myocardial infarction: Secondary | ICD-10-CM | POA: Insufficient documentation

## 2015-10-17 DIAGNOSIS — IMO0002 Reserved for concepts with insufficient information to code with codable children: Secondary | ICD-10-CM

## 2015-10-17 DIAGNOSIS — Z9889 Other specified postprocedural states: Secondary | ICD-10-CM | POA: Diagnosis not present

## 2015-10-17 DIAGNOSIS — I129 Hypertensive chronic kidney disease with stage 1 through stage 4 chronic kidney disease, or unspecified chronic kidney disease: Secondary | ICD-10-CM | POA: Diagnosis not present

## 2015-10-17 DIAGNOSIS — I209 Angina pectoris, unspecified: Secondary | ICD-10-CM | POA: Diagnosis present

## 2015-10-17 DIAGNOSIS — R0789 Other chest pain: Secondary | ICD-10-CM | POA: Diagnosis not present

## 2015-10-17 DIAGNOSIS — R0602 Shortness of breath: Secondary | ICD-10-CM | POA: Diagnosis not present

## 2015-10-17 LAB — CBC
HCT: 34 % — ABNORMAL LOW (ref 39.0–52.0)
HEMATOCRIT: 36.4 % — AB (ref 39.0–52.0)
HEMOGLOBIN: 10.8 g/dL — AB (ref 13.0–17.0)
HEMOGLOBIN: 11.7 g/dL — AB (ref 13.0–17.0)
MCH: 27.9 pg (ref 26.0–34.0)
MCH: 28.1 pg (ref 26.0–34.0)
MCHC: 31.8 g/dL (ref 30.0–36.0)
MCHC: 32.1 g/dL (ref 30.0–36.0)
MCV: 87.9 fL (ref 78.0–100.0)
MCV: 87.3 fL (ref 78.0–100.0)
Platelets: 221 10*3/uL (ref 150–400)
Platelets: 203 10*3/uL (ref 150–400)
RBC: 3.87 MIL/uL — ABNORMAL LOW (ref 4.22–5.81)
RBC: 4.17 MIL/uL — ABNORMAL LOW (ref 4.22–5.81)
RDW: 14.1 % (ref 11.5–15.5)
RDW: 13.9 % (ref 11.5–15.5)
WBC: 6.7 10*3/uL (ref 4.0–10.5)
WBC: 7.1 10*3/uL (ref 4.0–10.5)

## 2015-10-17 LAB — TROPONIN I

## 2015-10-17 LAB — BASIC METABOLIC PANEL
ANION GAP: 7 (ref 5–15)
BUN: 34 mg/dL — ABNORMAL HIGH (ref 6–20)
CALCIUM: 9.4 mg/dL (ref 8.9–10.3)
CO2: 23 mmol/L (ref 22–32)
Chloride: 110 mmol/L (ref 101–111)
Creatinine, Ser: 1.58 mg/dL — ABNORMAL HIGH (ref 0.61–1.24)
GFR, EST AFRICAN AMERICAN: 43 mL/min — AB (ref >60)
GFR, EST NON AFRICAN AMERICAN: 37 mL/min — AB (ref >60)
GLUCOSE: 98 mg/dL (ref 65–99)
Potassium: 5.4 mmol/L — ABNORMAL HIGH (ref 3.5–5.1)
SODIUM: 140 mmol/L (ref 135–145)

## 2015-10-17 LAB — I-STAT TROPONIN, ED: TROPONIN I, POC: 0.02 ng/mL (ref 0.00–0.08)

## 2015-10-17 LAB — CREATININE, SERUM
Creatinine, Ser: 1.5 mg/dL — ABNORMAL HIGH (ref 0.61–1.24)
GFR calc Af Amer: 45 mL/min — ABNORMAL LOW (ref >60)
GFR, EST NON AFRICAN AMERICAN: 39 mL/min — AB (ref >60)

## 2015-10-17 LAB — TSH: TSH: 1.844 u[IU]/mL (ref 0.350–4.500)

## 2015-10-17 MED ORDER — HEPARIN SODIUM (PORCINE) 5000 UNIT/ML IJ SOLN
5000.0000 [IU] | Freq: Three times a day (TID) | INTRAMUSCULAR | Status: DC
Start: 2015-10-17 — End: 2015-10-18
  Filled 2015-10-17: qty 1

## 2015-10-17 MED ORDER — NITROGLYCERIN 0.4 MG SL SUBL: 0.4000 mg | SUBLINGUAL_TABLET | SUBLINGUAL | Status: DC | PRN

## 2015-10-17 MED ORDER — ACETAMINOPHEN 325 MG PO TABS
650.0000 mg | ORAL_TABLET | ORAL | Status: DC | PRN
  Administered 2015-10-18: 650 mg via ORAL
  Filled 2015-10-17: qty 2

## 2015-10-17 MED ORDER — ONDANSETRON HCL 4 MG/2ML IJ SOLN: 4.0000 mg | Freq: Four times a day (QID) | INTRAMUSCULAR | Status: DC | PRN

## 2015-10-17 MED ORDER — SODIUM CHLORIDE 0.9 % IV SOLN
INTRAVENOUS | Status: DC
  Administered 2015-10-17: 50 mL/h via INTRAVENOUS

## 2015-10-17 MED ORDER — ISOSORBIDE MONONITRATE ER 30 MG PO TB24
30.0000 mg | ORAL_TABLET | Freq: Every day | ORAL | Status: DC
  Administered 2015-10-17 – 2015-10-18 (×2): 30 mg via ORAL
  Filled 2015-10-17 (×2): qty 1

## 2015-10-17 MED ORDER — ASPIRIN EC 81 MG PO TBEC
81.0000 mg | DELAYED_RELEASE_TABLET | Freq: Every day | ORAL | Status: DC
  Administered 2015-10-18: 81 mg via ORAL
  Filled 2015-10-17: qty 1

## 2015-10-17 MED ORDER — FERROUS GLUCONATE 324 (38 FE) MG PO TABS
324.0000 mg | ORAL_TABLET | Freq: Every day | ORAL | Status: DC
  Administered 2015-10-18: 324 mg via ORAL
  Filled 2015-10-17 (×2): qty 1

## 2015-10-17 MED ORDER — LEVOTHYROXINE SODIUM 75 MCG PO TABS
75.0000 ug | ORAL_TABLET | Freq: Every day | ORAL | Status: DC
  Administered 2015-10-18: 75 ug via ORAL
  Filled 2015-10-17 (×2): qty 1

## 2015-10-17 MED ORDER — ADULT MULTIVITAMIN W/MINERALS CH
1.0000 | ORAL_TABLET | Freq: Every day | ORAL | Status: DC
  Administered 2015-10-18: 1 via ORAL
  Filled 2015-10-17: qty 1

## 2015-10-17 NOTE — Patient Instructions (Signed)
The cardiologist will be called The patient will be transported to the hospital by ambulance Oxygen has been started and adult aspirin was given

## 2015-10-17 NOTE — ED Provider Notes (Signed)
CSN: [id number]     Arrival date & time 10/17/15  1123 History   First MD Initiated Contact with Patient 10/17/15 1126     Chief Complaint  Patient presents with  . Chest Pain     (Consider location/radiation/quality/duration/timing/severity/associated sxs/prior Treatment) HPI   81 year old male with history of hypertension, CAD, prior MI, renal cell cancer and prostate cancer, brought here via EMS for evaluation of chest pain. Patient report early this morning before breakfast he developed left-sided chest pressure. He continues eating his breakfast but states that the pain persist. Pressure sensation is located in the left chest, nonradiating without associated nausea, lightheadedness, dizziness or shortness of breath. He did not break out in sweats. Pain has been intermittent for the past 4 hours. He took 1 SL nitro without relief.  EMS arrived and gave pt 3 nitro and 325mg  ASA.  Pain has since resolved. Patient is back to his normal baseline. He denies having similar pain like this in the past. Had a heart attack 3 years ago, was cared by cardiologist Dr. Percival Spanish.  Patient denies having fever, chills, headache, vision changes, URI symptoms, back pain, abdominal pain, focal numbness or weakness. EMS mentioned a new first-degree heart block on initial EKG  Past Medical History  Diagnosis Date  . Hypothyroidism   . Hypertension   . Rectum cancer ([place]) 1986  . PC (prostate cancer) ([place]) 1987  . Renal cell cancer ([place])   . Osteoporosis   . Esophageal stricture   . Anemia   . CAD (coronary artery disease)     Inferior MI.  LAD mid 80% stenosis, circ mild plaque, RCA 100% with thrombectomy PTCA.  EF 65%  . CKD (chronic kidney disease)   . Myocardial infarction [facility]) 03/12/13    hochrein-cardiologist   Past Surgical History  Procedure Laterality Date  . Kidney surgery  2004  . Radiofrequency ablation kidney  2006  . Hernia repair      2007  . Renal sphincter appliance  2007  .  Colonoscopy  01/08/2012    Procedure: COLONOSCOPY;  Surgeon: Rogene Houston, MD;  [facility];  Service: Endoscopy;  Laterality: N/A;  1030  . Colostomy Left 1986    colon cancer  . Percutaneous coronary intervention-balloon only    . Left heart catheterization with coronary angiogram N/A 03/12/2013    Procedure: STEMI;  Surgeon: Burnell Blanks, MD;  Location: [facility] CATH LAB;  Service: Cardiovascular;  Laterality: N/A;  . Prostatectomy    . Right inguinal hernia repair    . Colonoscopy N/A 07/20/2015    Procedure: COLONOSCOPY;  Surgeon: Rogene Houston, MD;  Location: [facility];  Service: Endoscopy;  Laterality: N/A;  930 - moved to 9/29 @ 2:00   Family History  Problem Relation Age of Onset  . Stroke Mother 75  . AAA (abdominal aortic aneurysm) Father 26    died from rupture at 57 yo   Social History  Substance Use Topics  . Smoking status: Former Smoker -- 0.50 packs/day    Types: Cigarettes    Start date: 10/21/1946    Quit date: 10/21/1968  . Smokeless tobacco: None  . Alcohol Use: 0.5 oz/week    1 Standard drinks or equivalent per week     Comment: rare, wine    Review of Systems  All other systems reviewed and are negative.     Allergies  Lipitor  Home Medications   Prior to Admission medications   Medication Sig  Start Date End Date Taking? Authorizing Provider  aspirin 81 MG tablet Take 1 tablet (81 mg total) by mouth daily. 03/18/13   Rhonda G Barrett, PA-C  Bioflavonoid Products (BIOFLEX) TABS Take 1 tablet by mouth daily.    Historical Provider, MD  Cholecalciferol (VITAMIN D3) 2000 UNITS TABS Take 2,000 Units by mouth daily.    Historical Provider, MD  ferrous gluconate (FERGON) 324 MG tablet Take 324 mg by mouth daily with breakfast.    Historical Provider, MD  levothyroxine (SYNTHROID, LEVOTHROID) 75 MCG tablet TAKE 1 TABLET DAILY 03/13/15   Chipper Herb, MD  Multiple Vitamin (MULITIVITAMIN WITH MINERALS) TABS Take 1 tablet by mouth  daily.    Historical Provider, MD  nitroGLYCERIN (NITROSTAT) 0.4 MG SL tablet PLACE 1 TABLET UNDER THE TONGUE AT ONSET OF CHEST PAIN EVERY 5 MINTUES UP TO 3 TIMES AS NEEDED 02/13/15   Chipper Herb, MD   There were no vitals taken for this visit. Physical Exam  Constitutional: He appears well-developed and well-nourished. No distress.  HENT:  Head: Atraumatic.  Eyes: Conjunctivae are normal.  Neck: Neck supple.  Cardiovascular: Normal rate and regular rhythm.   Pulmonary/Chest: Effort normal and breath sounds normal.  Abdominal: Soft. There is no tenderness.  Musculoskeletal: He exhibits no edema.  Neurological: He is alert.  Skin: No rash noted.  Psychiatric: He has a normal mood and affect.  Nursing note and vitals reviewed.   ED Course  Procedures (including critical care time) Labs Review Labs Reviewed  BASIC METABOLIC PANEL - Abnormal; Notable for the following:    Potassium 5.4 (*)    BUN 34 (*)    Creatinine, Ser 1.58 (*)    GFR calc non Af Amer 37 (*)    GFR calc Af Amer 43 (*)    All other components within normal limits  CBC - Abnormal; Notable for the following:    RBC 3.87 (*)    Hemoglobin 10.8 (*)    HCT 34.0 (*)    All other components within normal limits  I-STAT TROPOININ, ED    Imaging Review Dg Chest 2 View  10/17/2015  CLINICAL DATA:  Chest pressure and shortness of breath for 1 day, history hypertension, coronary artery disease post MI, rectal cancer, prostate cancer, renal cell cancer, former smoker EXAM: CHEST  2 VIEW COMPARISON:  09/27/2015 FINDINGS: Enlargement of cardiac silhouette with LEFT ventricular prominence. Large hiatal hernia. Mild elongation of aorta. Mediastinal contours and pulmonary vascularity otherwise normal. Minimal bibasilar atelectasis. Lungs otherwise clear. No pleural effusion or pneumothorax. Bones demineralized. IMPRESSION: Enlargement of cardiac silhouette with LEFT ventricular prominence. Large hiatal hernia. Minimal  bibasilar atelectasis. Electronically Signed   By: Lavonia Dana [name]   On: 10/17/2015 12:33   I have personally reviewed and evaluated these images and lab results as part of my medical decision-making.   EKG Interpretation   Date/Time:  Tuesday October 17 2015 11:43:18 EST Ventricular Rate:  83 PR Interval:  227 QRS Duration: 100 QT Interval:  413 QTC Calculation: 485 R Axis:   -36 Text Interpretation:  Sinus rhythm Ventricular bigeminy , new since last  tracing Prolonged PR interval Incomplete RBBB and LAFB Low voltage,  precordial leads Confirmed by KNAPP  MD-J, JON ([id number]) on 10/17/2015  11:45:46 AM      MDM   Final diagnoses:  Left sided chest pain    BP 147/84 mmHg  Pulse 60  Resp 14  SpO2 98%   12:38 PM Elderly male with  significant history of cardiac disease here with complaints of left-sided chest pressure which has since resolved. EKG showed no acute ischemic changes but does show ventricular bigeminy which is new. Workup has been unremarkable. Care discussed with Dr. Tomi Bamberger  1:19 PM  Appreciate consultation from cardiology (spoke with Abigail Butts).  Cardiologist will see pt in the ER and will determine disposition. Pt is aware of plan.    4:01 PM Cardiology has seen and evaluated pt and felt he will need to be admitted for further management.    Domenic Moras, PA-C 10/17/15 1601  Dorie Rank, MD 10/18/15 [phone]

## 2015-10-17 NOTE — H&P (Signed)
Patient ID: SEVAG DIEBEL MRN: [id number], DOB/AGE: 79-Jun-1924   Admit date: 10/17/2015   Primary Physician: Redge Gainer, MD Primary Cardiologist: Dr. Percival Spanish  Pt. Profile:  Frederick Davis is a 81 y.o. male with a history of CAD, HTN, HL, AS, hiatal hernia, CKD stage III, AS, hypothyroidism, prostate cancer, renal cell cancer, colon cancer and anemia who presented 10/17/2015  to [facility] ED for evaluation of chest pain from his PCP office.   HPI: As above. The patient inferior STEMI 02/2013  in cardiogenic shock with bradycardia, hypotension. S/p thrombectomy and PTCA of the RCA or proximal total occlusion. It was a dominant vessel. His EF was preserved. The LAD has a 90% mid stenosis for which medical therapy was recommended. He was initially started on Brilinta but had some shortness of breath, so this was changed to Plavix. At that time he was also noted to have  Complete heart block  on initial EMS ECGs. The temporary wire was used and he was pacing when necessary. His ECG improved in the temporary wire was discontinued. He had no further problems with heart block.  The patient was in usual state of health up until this morning developed a substernal chest pressure after break past. He rates the pain as 3/10 which persisted for one to 2 hours. No improvement after sublingual nitroglycerin x 1. Patient denies associated shortness of breath, nausea, vomiting, radiation of pain. The patient went to see his primary care provider where EKG shows new changes - first degree AV block and sent to ED for further evaluation. He said was received sublingual nitroglycerin x 3 and aspirin 324 mg by EMS with resolution of his chest pain.   He is fairly active at home and plays golf 2-3 hours per week. He watches his diet and also enrolled in  local exercise program.The patient denies nausea, vomiting, fever, palpitations, shortness of breath, orthopnea, PND, dizziness, syncope, cough, congestion,  abdominal pain, hematochezia, melena, lower extremity edema.  In ED, POC troponin 0.02. K of 5.4. BUN of 34. Creatinine of 1.58. Hemoglobin of 10.8. EKG shows normal sinus rhythm with prolonged PR interval, ventricular bigeminy, incomplete right bundle branch block.  Problem List  Past Medical History  Diagnosis Date  . Hypothyroidism   . Hypertension   . Rectum cancer ([place]) 1986  . PC (prostate cancer) ([place]) 1987  . Renal cell cancer ([place])   . Osteoporosis   . Esophageal stricture   . Anemia   . CAD (coronary artery disease)     Inferior MI.  LAD mid 80% stenosis, circ mild plaque, RCA 100% with thrombectomy PTCA.  EF 65%  . CKD (chronic kidney disease)   . Myocardial infarction [facility]) 03/12/13    hochrein-cardiologist    Past Surgical History  Procedure Laterality Date  . Kidney surgery  2004  . Radiofrequency ablation kidney  2006  . Hernia repair      2007  . Renal sphincter appliance  2007  . Colonoscopy  01/08/2012    Procedure: COLONOSCOPY;  Surgeon: Rogene Houston, MD;  [facility];  Service: Endoscopy;  Laterality: N/A;  1030  . Colostomy Left 1986    colon cancer  . Percutaneous coronary intervention-balloon only    . Left heart catheterization with coronary angiogram N/A 03/12/2013    Procedure: STEMI;  Surgeon: Burnell Blanks, MD;  Location[facility] CATH LAB;  Service: Cardiovascular;  Laterality: N/A;  . Prostatectomy    . Right inguinal  hernia repair    . Colonoscopy N/A 07/20/2015    Procedure: COLONOSCOPY;  Surgeon: Rogene Houston, MD;  Location: [facility];  Service: Endoscopy;  Laterality: N/A;  930 - moved to 9/29 @ 2:00     Allergies  Allergies  Allergen Reactions  . Lipitor [Atorvastatin] Other (See Comments)    Muscle aches     Home Medications  Prior to Admission medications   Medication Sig Start Date End Date Taking? Authorizing Provider  aspirin 81 MG tablet Take 1 tablet (81 mg total) by mouth daily. 03/18/13   Rhonda G  Barrett, PA-C  Bioflavonoid Products (BIOFLEX) TABS Take 1 tablet by mouth daily.    Historical Provider, MD  Cholecalciferol (VITAMIN D3) 2000 UNITS TABS Take 2,000 Units by mouth daily.    Historical Provider, MD  ferrous gluconate (FERGON) 324 MG tablet Take 324 mg by mouth daily with breakfast.    Historical Provider, MD  levothyroxine (SYNTHROID, LEVOTHROID) 75 MCG tablet TAKE 1 TABLET DAILY 03/13/15   Chipper Herb, MD  Multiple Vitamin (MULITIVITAMIN WITH MINERALS) TABS Take 1 tablet by mouth daily.    Historical Provider, MD  nitroGLYCERIN (NITROSTAT) 0.4 MG SL tablet PLACE 1 TABLET UNDER THE TONGUE AT ONSET OF CHEST PAIN EVERY 5 MINTUES UP TO 3 TIMES AS NEEDED 02/13/15   Chipper Herb, MD    Family History  Family History  Problem Relation Age of Onset  . Stroke Mother 79  . AAA (abdominal aortic aneurysm) Father 58    died from rupture at 68 yo   Family Status  Relation Status Death Age  . Mother Deceased   . Father Deceased     Social History  Social History   Social History  . Marital Status: Married    Spouse Name: Mikle Bosworth  . Number of Children: N/A  . Years of Education: N/A   Occupational History  . pharmacist-retired   . preacher-retired    Social History Main Topics  . Smoking status: Former Smoker -- 0.50 packs/day    Types: Cigarettes    Start date: 10/21/1946    Quit date: 10/21/1968  . Smokeless tobacco: Not on file  . Alcohol Use: 0.5 oz/week    1 Standard drinks or equivalent per week     Comment: rare, wine  . Drug Use: No  . Sexual Activity: Not on file   Other Topics Concern  . Not on file   Social History Narrative     Review of Systems General:  No chills, fever, night sweats or weight changes.  Cardiovascular:  No  dyspnea on exertion, edema, orthopnea, palpitations, paroxysmal nocturnal dyspnea. Dermatological: No rash, lesions/masses Respiratory: No cough, dyspnea Urologic: No hematuria, dysuria Abdominal:   No nausea,  vomiting, diarrhea, bright red blood per rectum, melena, or hematemesis Neurologic:  No visual changes, wkns, changes in mental status. All other systems reviewed and are otherwise negative except as noted above.  Physical Exam  Blood pressure 147/84, pulse 60, resp. rate 14, SpO2 98 %.  General: Pleasant, NAD Psych: Normal affect. Neuro: Alert and oriented X 3. Moves all extremities spontaneously. HEENT: Normal  Neck: Supple without bruits or JVD. Lungs:  Resp regular and unlabored, CTA. Heart: RRR no s3, s4, 3/6 systolic murmurs. Abdomen: Soft, non-tender, non-distended, BS + x 4.  Extremities: No clubbing, cyanosis or edema. DP/PT/Radials 2+ and equal bilaterally.  Labs  No results for input(s): CKTOTAL, CKMB, TROPONINI in the last 72 hours. Lab Results  Component  Value Date   WBC 6.7 10/17/2015   HGB 10.8* 10/17/2015   HCT 34.0* 10/17/2015   MCV 87.9 10/17/2015   PLT 221 10/17/2015    Recent Labs Lab 10/17/15 1200  NA 140  K 5.4*  CL 110  CO2 23  BUN 34*  CREATININE 1.58*  CALCIUM 9.4  GLUCOSE 98   Lab Results  Component Value Date   CHOL 149 09/20/2015   HDL 67 09/20/2015   LDLCALC 64 09/20/2015   TRIG 88 09/20/2015    Radiology/Studies  Dg Chest 2 View  10/17/2015  CLINICAL DATA:  Chest pressure and shortness of breath for 1 day, history hypertension, coronary artery disease post MI, rectal cancer, prostate cancer, renal cell cancer, former smoker EXAM: CHEST  2 VIEW COMPARISON:  09/27/2015 FINDINGS: Enlargement of cardiac silhouette with LEFT ventricular prominence. Large hiatal hernia. Mild elongation of aorta. Mediastinal contours and pulmonary vascularity otherwise normal. Minimal bibasilar atelectasis. Lungs otherwise clear. No pleural effusion or pneumothorax. Bones demineralized. IMPRESSION: Enlargement of cardiac silhouette with LEFT ventricular prominence. Large hiatal hernia. Minimal bibasilar atelectasis. Electronically Signed   By: Lavonia Dana [name]    On: 10/17/2015 12:33   Dg Chest 2 View  09/27/2015  CLINICAL DATA:  Hypertension. EXAM: CHEST  2 VIEW COMPARISON:  10/19/2013. FINDINGS: Mediastinum and hilar structures normal. Left base subsegmental atelectasis and/or scar. Sliding hiatal hernia. Degenerative change thoracic spine. IMPRESSION: 1. Left base subsegmental atelectasis and/or scarring. 2. Sliding hiatal hernia.  Chest is stable prior exam. Electronically Signed   By: Marcello Moores  Register   On: 09/27/2015 10:22   CATH: 03/12/2013 Hemodynamic Findings: Central aortic pressure: 47/30 (beginning of case), 100/50 (closing) Left ventricular pressure: 100/8/13  Angiographic Findings:  Left main: No obstructive disease.   Left Anterior Descending Artery: Large caliber vessel that courses to the apex. 90% mid stenosis.   Circumflex Artery: Moderate caliber vessel with early intermediate branch, patent with mild plaque.   Right Coronary Artery: 100% proximal occlusion. At the conclusion of the case, this was a large dominant vessel.   Left Ventricular Angiogram: LVEF=65%.   Impression: 1. Acute inferior STEMI secondary to occluded proximal RCA complicated by cardiogenic shock/bradycardia. 2. Successful thrombectomy, PTCA with balloon angioplasty only of the RCA 3. Severe disease in the mid LAD 4. Preserved LV systolic function  Echo [id number] LV EF: 55% -  60%  ------------------------------------------------------------ Indications:   Chest pain 786.51.  ------------------------------------------------------------ History:  PMH: Acquired from the patient and from the patient's chart. PMH: Complete Heart Block. STEMI. Cardiogenic Shock. Risk factors: Former tobacco use. Hypertension.  ------------------------------------------------------------ Study Conclusions  - Left ventricle: The cavity size was normal. Wall thickness was increased in a pattern of mild LVH. Systolic function was normal. The  estimated ejection fraction was in the range of 55% to 60%. Doppler parameters are consistent with abnormal left ventricular relaxation (grade 1 diastolic dysfunction). - Aortic valve: AV is thickened, calcified with mildly restricted motion. Peak and mean gradients through the valve are 23 and 14 mm Hg respectively.  Myocardial Perfusion Imaging 04/06/2013  No evidence of ischemia. NL LV function. No further work up. Call [facility] with the results and send results to Redge Gainer, MD   ECG  Vent. rate 83 BPM PR interval 227 ms QRS duration 100 ms QT/QTc 413/485 ms P-R-T axes 31 -36 21  ASSESSMENT AND PLAN  1. Chest puressure - His pain is concerning for cardiac etiology. Was similar to prior STEMI. EKG with new changes. Troponin x  1 negative. Currently chest pain-free. - Will admit. Start Imdur 30mg . Cycle enzyme and serial EKG. Differential could include GI etiology of pain given large hiatal hernia on cxr.   2. CAD - STEMI 03/12/2013 s/p thrombectomy and PTCA of the RCA or proximal total occlusion. It was a dominant vessel. His EF was preserved. The LAD has a 90% mid stenosis for which medical therapy was recommended. - Normal myoview post cath as above 04/06/2013.  3. AS - mild on echo 03/2013. No syncope.   4. HTN - Stable and well controlled.   5. 1st AV block - New from prior ekg. Hx oc CHF in setting of STEMI 2014. He is not on BB.  - Serial EKG  6. CDK, stage III - Baseline creatinine 1.3-1.5. Today 1.58.   7. Anemia - Baseline hemoglobin of 11-12.   8. Hyperkalemia - K of 5.4. Avoid medicine that can increase K. Will recheck in AM.   Signed, Bhagat,Bhavinkumar, PA-C 10/17/2015, 1:57 PM Pager [phone]  Personally seen and examined. Agree with above. Chest discomfort, relieved with 3 NTG. We'll observe overnight. Gentle hydration with chronic  Mild elevation in potassium. Avoid beta blockers because of prior first-degree heart block. Very  pleasant.  Candee Furbish, MD

## 2015-10-17 NOTE — ED Notes (Signed)
EKG not completed with 68min due to no portable available room needed to be bleach

## 2015-10-17 NOTE — ED Notes (Signed)
Patient comes PCP with complaints of Chest pain. Patent states pain was substernal non-radiating. Per EMS EKG done at PCP showed 1st degree Heart block which is new for the patient. Patient give a total of 4 nitro and 324ASA with EMS and PCP.

## 2015-10-17 NOTE — Progress Notes (Addendum)
Subjective:    Patient ID: Frederick Davis, male    DOB: 11/09/1922, 81 y.o.   MRN: [id number]  HPI Patient is here today with chest discomfort. Patient states the chest discomfort started around 7am. He states that he did take a nitroglycerin around 9:15am. He states that he did not notice any change in the discomfort with the Nitroglycerin. Patient states he has not experienced any SOB. He has not had any nausea or vomiting. He is taking 1 nitroglycerin and he has not had any aspirin this morning. The initial EKG did not show any acute changes. He indicates that when he had his first heart attack that he felt similar to this.    Review of Systems  Constitutional: Negative.   HENT: Negative.   Respiratory: Negative.   Cardiovascular: Positive for chest pain.  Gastrointestinal: Negative.   Endocrine: Negative.   Genitourinary: Negative.   Musculoskeletal: Negative.   Skin: Negative.   Allergic/Immunologic: Negative.   Neurological: Negative.   Hematological: Negative.   Psychiatric/Behavioral: Negative.      Patient Active Problem List   Diagnosis Date Noted  . Rectal cancer ([place]) 07/19/2013  . Hyperlipidemia 07/19/2013  . Hypotension arterial 03/18/2013  . Complete heart block ([place]) 03/18/2013  . Anemia   . CKD (chronic kidney disease) stage 3, GFR 30-59 ml/min 03/12/2013  . Acute MI, inferior wall, initial episode of care ([place]) 03/12/2013  . Impotence 03/12/2013  . History of renal stone 03/12/2013  . Hiatal hernia 03/12/2013  . H/O lithotripsy 03/12/2013  . SCCA (squamous cell carcinoma) of skin 03/12/2013  . S/P prostatectomy, radical 03/12/2013  . Left renal mass 03/12/2013  . Arthritis 03/12/2013  . ST elevation myocardial infarction (STEMI) of inferior wall ([place]) 03/12/2013  . Coronary atherosclerosis of native coronary artery 03/12/2013  . Cardiogenic shock ([place]) 03/12/2013  . Acute respiratory failure ([place]) 03/12/2013  . Altered mental status 03/12/2013  .  Essential hypertension, benign 01/14/2013  . Colostomy in place [facility]) 01/14/2013  . Prostate cancer ([place]) 01/14/2013  . Renal cell carcinoma, Right 01/14/2013  . Osteoporosis, unspecified 01/14/2013   Outpatient Encounter Prescriptions as of 10/17/2015  Medication Sig  . aspirin 81 MG tablet Take 1 tablet (81 mg total) by mouth daily.  Marland Kitchen Bioflavonoid Products (BIOFLEX) TABS Take 1 tablet by mouth daily.  . Cholecalciferol (VITAMIN D3) 2000 UNITS TABS Take 2,000 Units by mouth daily.  . ferrous gluconate (FERGON) 324 MG tablet Take 324 mg by mouth daily with breakfast.  . levothyroxine (SYNTHROID, LEVOTHROID) 75 MCG tablet TAKE 1 TABLET DAILY  . Multiple Vitamin (MULITIVITAMIN WITH MINERALS) TABS Take 1 tablet by mouth daily.  . nitroGLYCERIN (NITROSTAT) 0.4 MG SL tablet PLACE 1 TABLET UNDER THE TONGUE AT ONSET OF CHEST PAIN EVERY 5 MINTUES UP TO 3 TIMES AS NEEDED  . [DISCONTINUED] cyclobenzaprine (FLEXERIL) 10 MG tablet Take 1 tablet (10 mg total) by mouth 3 (three) times daily as needed for muscle spasms. (Patient not taking: Reported on 09/27/2015)   Facility-Administered Encounter Medications as of 10/17/2015  Medication  . etomidate (AMIDATE) injection  . lidocaine (cardiac) 100 mg/30ml (XYLOCAINE) 20 MG/ML injection 2%  . succinylcholine (ANECTINE) injection       Objective:   Physical Exam  Constitutional: He is oriented to person, place, and time. He appears well-developed and well-nourished. No distress.  The patient is calm and alert. He is not noticed any difference with the dull chest discomfort since he took the nitroglycerin and he recently belts and  the pressure is still there.  HENT:  Head: Normocephalic and atraumatic.  Eyes: Conjunctivae and EOM are normal. Pupils are equal, round, and reactive to light. Right eye exhibits no discharge. Left eye exhibits no discharge. No scleral icterus.  Neck: Normal range of motion. Neck supple. No thyromegaly present.    Cardiovascular: Normal rate and regular rhythm.   Murmur heard. The heart has a regular rate and rhythm at 60/m. There is a grade 2/6 systolic ejection murmur.  Pulmonary/Chest: Effort normal and breath sounds normal. No respiratory distress. He has no wheezes. He has no rales.  Abdominal: Soft. Bowel sounds are normal. He exhibits no mass. There is no tenderness. There is no rebound and no guarding.  Genitourinary: Prostate normal.  Musculoskeletal: Normal range of motion. He exhibits no edema.  Lymphadenopathy:    He has no cervical adenopathy.  Neurological: He is alert and oriented to person, place, and time. No cranial nerve deficit.  Skin: Skin is warm and dry. No rash noted.  Psychiatric: He has a normal mood and affect. His behavior is normal. Judgment and thought content normal.  Nursing note and vitals reviewed.  BP 128/74 mmHg  Pulse 75  Temp(Src) 96.6 F (35.9 C) (Oral)  Ht 5\' 6"  (1.676 m)  Wt 176 lb (79.833 kg)  BMI 28.42 kg/m2  The wife and stepson were present and everything was explained to them and transportation is being arranged for the patient to be carried to the hospital.  EKG: No acute changes. First degree AV block  O2 was started on the patient at 3 L/m and he was given an adult aspirin  He remained alert and calm during this time and indicated that the pressure sensation was continuing. I spoke with Dr. Stanford Breed, an office-based cardiologist and he said the patient should be transported and he would notify the hospital cardiologist that the patient would be coming.  All of these results were reviewed with the EMS attendants when they arrived and the patient was safely transported from the office to the EMS vehicle to be taken in [facility] for further evaluation     Assessment & Plan:  1. Chest pain, unspecified chest pain type -The cardiologist has been contacted and he is recommended that EMS bring the patient to the hospital for further evaluation for  possible MI - EKG 12-Lead  2. ASCVD (arteriosclerotic cardiovascular disease) -Transported to Hospital for further evaluation for chest pain  3. First degree AV block -This is new when compared to an EKG that was done back in the spring.  4. History of colostomy -He's had no problems with his colostomy  5. Prostate cancer [facility]) -He is being followed regularly for this by the urologist.  Patient Instructions  The cardiologist will be called The patient will be transported to the hospital by ambulance Oxygen has been started and adult aspirin was given   Arrie Senate MD

## 2015-10-17 NOTE — Progress Notes (Deleted)
   Subjective:    Patient ID: Frederick Davis, male    DOB: 07/23/1923, 81 y.o.   MRN: [id number]  HPI Patient here today for chest discomfort that started about 2-3 hours ago.     Patient Active Problem List   Diagnosis Date Noted  . Rectal cancer ([place]) 07/19/2013  . Hyperlipidemia 07/19/2013  . Hypotension arterial 03/18/2013  . Complete heart block ([place]) 03/18/2013  . Anemia   . CKD (chronic kidney disease) stage 3, GFR 30-59 ml/min 03/12/2013  . Acute MI, inferior wall, initial episode of care ([place]) 03/12/2013  . Impotence 03/12/2013  . History of renal stone 03/12/2013  . Hiatal hernia 03/12/2013  . H/O lithotripsy 03/12/2013  . SCCA (squamous cell carcinoma) of skin 03/12/2013  . S/P prostatectomy, radical 03/12/2013  . Left renal mass 03/12/2013  . Arthritis 03/12/2013  . ST elevation myocardial infarction (STEMI) of inferior wall ([place]) 03/12/2013  . Coronary atherosclerosis of native coronary artery 03/12/2013  . Cardiogenic shock ([place]) 03/12/2013  . Acute respiratory failure ([place]) 03/12/2013  . Altered mental status 03/12/2013  . Essential hypertension, benign 01/14/2013  . Colostomy in place [facility]) 01/14/2013  . Prostate cancer ([place]) 01/14/2013  . Renal cell carcinoma, Right 01/14/2013  . Osteoporosis, unspecified 01/14/2013   Outpatient Encounter Prescriptions as of 10/17/2015  Medication Sig  . aspirin 81 MG tablet Take 1 tablet (81 mg total) by mouth daily.  Marland Kitchen Bioflavonoid Products (BIOFLEX) TABS Take 1 tablet by mouth daily.  . Cholecalciferol (VITAMIN D3) 2000 UNITS TABS Take 2,000 Units by mouth daily.  . ferrous gluconate (FERGON) 324 MG tablet Take 324 mg by mouth daily with breakfast.  . levothyroxine (SYNTHROID, LEVOTHROID) 75 MCG tablet TAKE 1 TABLET DAILY  . Multiple Vitamin (MULITIVITAMIN WITH MINERALS) TABS Take 1 tablet by mouth daily.  . nitroGLYCERIN (NITROSTAT) 0.4 MG SL tablet PLACE 1 TABLET UNDER THE TONGUE AT ONSET OF CHEST PAIN EVERY 5  MINTUES UP TO 3 TIMES AS NEEDED  . [DISCONTINUED] cyclobenzaprine (FLEXERIL) 10 MG tablet Take 1 tablet (10 mg total) by mouth 3 (three) times daily as needed for muscle spasms. (Patient not taking: Reported on 09/27/2015)   Facility-Administered Encounter Medications as of 10/17/2015  Medication  . etomidate (AMIDATE) injection  . lidocaine (cardiac) 100 mg/51ml (XYLOCAINE) 20 MG/ML injection 2%  . succinylcholine (ANECTINE) injection       Review of Systems  Constitutional: Negative.   HENT: Negative.   Eyes: Negative.   Respiratory: Negative.   Cardiovascular: Positive for chest pain (discomfort in chest x 2-3 hours).  Gastrointestinal: Negative.   Endocrine: Negative.   Genitourinary: Negative.   Musculoskeletal: Negative.   Skin: Negative.   Allergic/Immunologic: Negative.   Neurological: Negative.   Hematological: Negative.   Psychiatric/Behavioral: Negative.        Objective:   Physical Exam BP 128/74 mmHg  Pulse 75  Temp(Src) 96.6 F (35.9 C) (Oral)  Ht 5\' 6"  (1.676 m)  Wt 176 lb (79.833 kg)  BMI 28.42 kg/m2        Assessment & Plan:

## 2015-10-17 NOTE — ED Notes (Signed)
Patient wife Orlando Penner) contact Cell number given to RN [phone]

## 2015-10-18 ENCOUNTER — Ambulatory Visit (HOSPITAL_COMMUNITY): Payer: Medicare Other

## 2015-10-18 DIAGNOSIS — I209 Angina pectoris, unspecified: Secondary | ICD-10-CM

## 2015-10-18 LAB — BASIC METABOLIC PANEL
ANION GAP: 8 (ref 5–15)
BUN: 29 mg/dL — ABNORMAL HIGH (ref 6–20)
CALCIUM: 9.4 mg/dL (ref 8.9–10.3)
CO2: 22 mmol/L (ref 22–32)
Chloride: 112 mmol/L — ABNORMAL HIGH (ref 101–111)
Creatinine, Ser: 1.47 mg/dL — ABNORMAL HIGH (ref 0.61–1.24)
GFR, EST AFRICAN AMERICAN: 47 mL/min — AB (ref >60)
GFR, EST NON AFRICAN AMERICAN: 40 mL/min — AB (ref >60)
Glucose, Bld: 88 mg/dL (ref 65–99)
Potassium: 4.3 mmol/L (ref 3.5–5.1)
SODIUM: 142 mmol/L (ref 135–145)

## 2015-10-18 LAB — LIPID PANEL
CHOL/HDL RATIO: 2.8 ratio
CHOLESTEROL: 153 mg/dL (ref 0–200)
HDL: 55 mg/dL (ref >40)
LDL Cholesterol: 79 mg/dL (ref 0–99)
TRIGLYCERIDES: 96 mg/dL (ref <150)
VLDL: 19 mg/dL (ref 0–40)

## 2015-10-18 LAB — CBC
HCT: 33.5 % — ABNORMAL LOW (ref 39.0–52.0)
HEMOGLOBIN: 10.5 g/dL — AB (ref 13.0–17.0)
MCH: 27.6 pg (ref 26.0–34.0)
MCHC: 31.3 g/dL (ref 30.0–36.0)
MCV: 87.9 fL (ref 78.0–100.0)
Platelets: 202 10*3/uL (ref 150–400)
RBC: 3.81 MIL/uL — AB (ref 4.22–5.81)
RDW: 14.1 % (ref 11.5–15.5)
WBC: 6.5 10*3/uL (ref 4.0–10.5)

## 2015-10-18 LAB — TROPONIN I: Troponin I: <0.03 ng/mL (ref <0.031)

## 2015-10-18 MED ORDER — PANTOPRAZOLE SODIUM 40 MG PO TBEC: 40.0000 mg | DELAYED_RELEASE_TABLET | Freq: Every day | ORAL | Status: DC

## 2015-10-18 MED ORDER — ISOSORBIDE MONONITRATE ER 30 MG PO TB24: 30.0000 mg | ORAL_TABLET | Freq: Every day | ORAL | Status: DC

## 2015-10-18 NOTE — Plan of Care (Signed)
Problem: Pain Managment: Goal: General experience of comfort will improve Outcome: Completed/Met Date Met:  10/18/15 Pt educated on pain scale and interventions. Pt verbalized understanding.

## 2015-10-18 NOTE — Discharge Instructions (Signed)
Angina Pectoris °Angina pectoris is a very bad feeling in the chest, neck, or arm. Your doctor may call it angina. There are four types of angina. Angina is caused by a lack of blood in the middle and thickest layer of the heart wall (myocardium). Angina may feel like a crushing or squeezing pain in the chest. It may feel like tightness or heavy pressure in the chest. Some people say it feels like gas, heartburn, or indigestion. Some people have symptoms other than pain. These include: °· Shortness of breath. °· Cold sweats. °· Feeling sick to your stomach (nausea). °· Feeling light-headed. °Many women have chest discomfort and some of the other symptoms. However, women often have different symptoms, such as: °· Feeling tired (fatigue). °· Feeling nervous for no reason. °· Feeling weak for no reason. °· Dizziness or fainting. °Women may have angina without any symptoms. °HOME CARE °· Take medicines only as told by your doctor. °· Take care of other health issues as told by your doctor. These include: °¨ High blood pressure (hypertension). °¨ Diabetes. °· Follow a heart-healthy diet. Your doctor can help you to choose healthy food options and make changes. °· Talk to your doctor to learn more about healthy cooking methods and use them. These include: °¨ Roasting. °¨ Grilling. °¨ Broiling. °¨ Baking. °¨ Poaching. °¨ Steaming. °¨ Stir-frying. °· Follow an exercise program approved by your doctor. °· Keep a healthy weight. Lose weight as told by your doctor. °· Rest when you are tired. °· Learn to manage stress. °· Do not use any tobacco, such as cigarettes, chewing tobacco, or electronic cigarettes. If you need help quitting, ask your doctor. °· If you drink alcohol, and your doctor says it is okay, limit yourself to no more than 1 drink per day. One drink equals 12 ounces of beer, 5 ounces of wine, or 1½ ounces of hard liquor. °· Stop illegal drug use. °· Keep all follow-up visits as told by your doctor. This is  important. °Do not take these medicines unless your doctor says that you can: °· Nonsteroidal anti-inflammatory drugs (NSAIDs). These include: °¨ Ibuprofen. °¨ Naproxen. °¨ Celecoxib. °· Vitamin supplements that have vitamin A, vitamin E, or both. °· Hormone therapy that contains estrogen with or without progestin. °GET HELP RIGHT AWAY IF: °· You have pain in your chest, neck, arm, jaw, stomach, or back that: °¨ Lasts more than a few minutes. °¨ Comes back. °¨ Does not get better after you take medicine under your tongue (sublingual nitroglycerin). °· You have any of these symptoms for no reason: °¨ Gas, heartburn, or indigestion. °¨ Sweating a lot. °¨ Shortness of breath or trouble breathing. °¨ Feeling sick to your stomach or throwing up. °¨ Feeling more tired than usual. °¨ Feeling nervous or worrying more than usual. °¨ Feeling weak. °¨ Diarrhea. °· You are suddenly dizzy or light-headed. °· You faint or pass out. °These symptoms may be an emergency. Do not wait to see if the symptoms will go away. Get medical help right away. Call your local emergency services (911 in the [place] Do not drive yourself to the hospital. °  °This information is not intended to replace advice given to you by your health care provider. Make sure you discuss any questions you have with your health care provider. °  °Document Released: 03/25/2008 Document Revised: 02/21/2015 Document Reviewed: 02/08/2014 °Elsevier Interactive Patient Education ©2016 [employer] ° °

## 2015-10-18 NOTE — Progress Notes (Signed)
Patient Name: Frederick Davis Date of Encounter: 10/18/2015  Primary Physician: Redge Gainer, MD Primary Cardiologist: Dr. Percival Spanish  Pt. Profile:  Frederick Davis is a 81 y.o. male with a history of CAD, HTN, HL, AS, hiatal hernia, CKD stage III, AS, hypothyroidism, prostate cancer, renal cell cancer, colon cancer and anemia who presented 10/17/2015 to [facility] ED for evaluation of chest pain from his PCP office.   SUBJECTIVE  Feeling well. No chest pain, sob or palpitations.   CURRENT MEDS . aspirin EC  81 mg Oral Daily  . ferrous gluconate  324 mg Oral Q breakfast  . heparin  5,000 Units Subcutaneous 3 times per day  . isosorbide mononitrate  30 mg Oral Daily  . levothyroxine  75 mcg Oral QAC breakfast  . multivitamin with minerals  1 tablet Oral Daily    OBJECTIVE  Filed Vitals:   10/17/15 1645 10/17/15 1733 10/17/15 2032 10/18/15 0540  BP: 165/108 175/95 114/63 134/73  Pulse: 70 55 68 58  Temp:  98 F (36.7 C) 97.7 F (36.5 C) 98.1 F (36.7 C)  TempSrc:  Oral Oral Oral  Resp: 17 16 18    Height:  5\' 6"  (1.676 m)    Weight:  177 lb 9.6 oz (80.559 kg)  174 lb 12.8 oz (79.289 kg)  SpO2: 98% 99% 96% 95%    Intake/Output Summary (Last 24 hours) at 10/18/15 0933 Last data filed at 10/18/15 0835  Gross per 24 hour  Intake    240 ml  Output    300 ml  Net    -60 ml   Filed Weights   10/17/15 1733 10/18/15 0540  Weight: 177 lb 9.6 oz (80.559 kg) 174 lb 12.8 oz (79.289 kg)    PHYSICAL EXAM  General: Pleasant, NAD. Neuro: Alert and oriented X 3. Moves all extremities spontaneously. Psych: Normal affect. HEENT:  Normal  Neck: Supple without bruits or JVD. Lungs:  Resp regular and unlabored, CTA. Heart: RR with bradycardia no s3, s4, Systolic  murmurs. Abdomen: Soft, non-tender, non-distended, BS + x 4.  Extremities: No clubbing, cyanosis or edema. DP/PT/Radials 2+ and equal bilaterally.  Accessory Clinical Findings  CBC  Recent Labs  10/17/15 1928  10/18/15 0530  WBC 7.1 6.5  HGB 11.7* 10.5*  HCT 36.4* 33.5*  MCV 87.3 87.9  PLT 203 [id number]   Basic Metabolic Panel  Recent Labs  10/17/15 1200 10/17/15 1928 10/18/15 0530  NA 140  --  142  K 5.4*  --  4.3  CL 110  --  112*  CO2 23  --  22  GLUCOSE 98  --  88  BUN 34*  --  29*  CREATININE 1.58* 1.50* 1.47*  CALCIUM 9.4  --  9.4   Cardiac Enzymes  Recent Labs  10/17/15 1928 10/18/15 0006 10/18/15 0530  TROPONINI <0.03 <0.03 <0.03   Fasting Lipid Panel  Recent Labs  10/18/15 0530  CHOL 153  HDL 55  LDLCALC 79  TRIG 96  CHOLHDL 2.8   Thyroid Function Tests  Recent Labs  10/17/15 1928  TSH 1.844    TELE  Sinus rhythm with rate in mid 50s.   Radiology/Studies  Dg Chest 2 View  10/17/2015  CLINICAL DATA:  Chest pressure and shortness of breath for 1 day, history hypertension, coronary artery disease post MI, rectal cancer, prostate cancer, renal cell cancer, former smoker EXAM: CHEST  2 VIEW COMPARISON:  09/27/2015 FINDINGS: Enlargement of cardiac silhouette with LEFT ventricular prominence. Large hiatal  hernia. Mild elongation of aorta. Mediastinal contours and pulmonary vascularity otherwise normal. Minimal bibasilar atelectasis. Lungs otherwise clear. No pleural effusion or pneumothorax. Bones demineralized. IMPRESSION: Enlargement of cardiac silhouette with LEFT ventricular prominence. Large hiatal hernia. Minimal bibasilar atelectasis. Electronically Signed   By: Lavonia Dana [name]   On: 10/17/2015 12:33   Dg Chest 2 View  09/27/2015  CLINICAL DATA:  Hypertension. EXAM: CHEST  2 VIEW COMPARISON:  10/19/2013. FINDINGS: Mediastinum and hilar structures normal. Left base subsegmental atelectasis and/or scar. Sliding hiatal hernia. Degenerative change thoracic spine. IMPRESSION: 1. Left base subsegmental atelectasis and/or scarring. 2. Sliding hiatal hernia.  Chest is stable prior exam. Electronically Signed   By: Marcello Moores  Register   On: 09/27/2015 10:22     ASSESSMENT AND PLAN 1. Chest puressure - Was similar to prior STEMI. Troponin x 3 negative. No further episode.  -  Started Imdur 30mg . Cycle enzyme and serial EKG. Differential could include GI etiology of pain given large hiatal hernia on cxr. Will add protonix.   2. CAD - STEMI 03/12/2013 s/p thrombectomy and PTCA of the RCA or proximal total occlusion. It was a dominant vessel. His EF was preserved. The LAD has a 90% mid stenosis for which medical therapy was recommended. - Normal myoview post cath as above 04/06/2013.  3. AS - mild on echo 03/2013. No syncope.  - Pending echo  4. HTN - Stable and well controlled.   5. 1st AV block - New from prior ekg. Hx of CHF in setting of STEMI 2014. Avoid BB.     6. CDK, stage III - Baseline creatinine 1.3-1.5.  - Improved with hydration. Creatinine of 1.47 today.   7. Anemia - Baseline hemoglobin of 11-12.  - Hgb of 10.5.   8. Hyperkalemia - Resolved with gentle hydration.   Signed, Bhagat,Bhavinkumar PA-C Pager [phone]  History and all data above reviewed.  Patient examined.  I agree with the findings as above.  Long discussion with the patient.  He had chest pain yesterday but this has not been a recurrent pattern and he has had none since admission.  No objective evidence of ischemia.  Enzymes negative.  EKG OK.  The patient exam reveals COR:RRR  ,  Lungs: Clear  ,  Abd: Positive bowel sounds, no rebound no guarding, Ext No edema  .  All available labs, radiology testing, previous records reviewed. Agree with documented assessment and plan. Chest pain:  OK for discharge.  Add Imdur 30 mg daily.  Further evaluation if he continues to have discomfort.  Follow up with me the next time I am in [place].    [name]  10:09 AM  10/18/2015

## 2015-10-18 NOTE — Plan of Care (Signed)
Problem: Safety: Goal: Ability to remain free from injury will improve Outcome: Completed/Met Date Met:  10/18/15 Pt educated on safety measures put in to place. Pt verbalized understanding. Call bell within reach.

## 2015-10-18 NOTE — Discharge Summary (Signed)
Discharge Summary   Patient ID: Frederick Davis,  MRN: [id number], DOB/AGE: 06/13/1923 81 y.o.  Admit date: 10/17/2015 Discharge date: 10/18/2015  Primary Care Provider: [name] Primary Cardiologist: Dr. Antoine Poche @ [facility]  Discharge Diagnoses Active Problems:   Anginal pain (HCC)  CAD  Hyperkalemia  HTN  HL  AS  hiatal hernia  CKD stage III  hypothyroidism,  prostate cancer  renal cell cancer  colon cancer    anemia   Allergies Allergies  Allergen Reactions  . Lipitor [Atorvastatin] Other (See Comments)    Muscle aches    Consultant: None  Procedures None  History of Present Illness  Frederick Davis is a 81 y.o. male with a history of CAD, HTN, HL, AS, hiatal hernia, CKD stage III, AS, hypothyroidism, prostate cancer, renal cell cancer, colon cancer and anemia who presented 10/17/2015 to [facility] ED for evaluation of chest pain from his PCP office.   The patient inferior STEMI 02/2013 in cardiogenic shock with bradycardia, hypotension. S/p thrombectomy and PTCA of the RCA or proximal total occlusion. It was a dominant vessel. His EF was preserved. The LAD has a 90% mid stenosis for which medical therapy was recommended. He was initially started on Brilinta but had some shortness of breath, so this was changed to Plavix. At that time he was also noted to have Complete heart block on initial EMS ECGs. The temporary wire was used and he was pacing when necessary. His ECG improved in the temporary wire was discontinued. He had no further problems with heart block.  The patient was in usual state of health up until this morning developed a substernal chest pressure after break past. He rates the pain as 3/10 which persisted for one to 2 hours. No improvement after sublingual nitroglycerin x 1. Patient denies associated shortness of breath, nausea, vomiting, radiation of pain. The patient went to see his primary care provider where EKG shows new changes - first  degree AV block and sent to ED for further evaluation. He said was received sublingual nitroglycerin x 3 and aspirin 324 mg by EMS with resolution of his chest pain.   He is fairly active at home and plays golf 2-3 hours per week. He watches his diet and also enrolled in local exercise program.The patient denies nausea, vomiting, fever, palpitations, shortness of breath, orthopnea, PND, dizziness, syncope, cough, congestion, abdominal pain, hematochezia, melena, lower extremity edema.  In ED, POC troponin 0.02. K of 5.4. BUN of 34. Creatinine of 1.58. Hemoglobin of 10.8. EKG shows normal sinus rhythm with prolonged PR interval, ventricular bigeminy, incomplete right bundle branch block.  Hospital Course  The patient was admitted for observation and started imdur 30mg . Troponin x 3 negative. No further episode. TSH normal. Hyperkalemia and elevated creatinine improved on gentle hydration overnight.   She has been seen by Dr. Antoine Poche today and deemed ready for discharge home. All follow-up appointments have been scheduled. Discharge medications are listed below.   He will discharged on Imdur 30mg  daily and Protonix added for possible Gi etiology and large hiatal hernia.   Discharge Vitals Blood pressure 134/73, pulse 58, temperature 98.1 F (36.7 C), temperature source Oral, resp. rate 18, height 5\' 6"  (1.676 m), weight 174 lb 12.8 oz (79.289 kg), SpO2 95 %.  Filed Weights   10/17/15 1733 10/18/15 0540  Weight: 177 lb 9.6 oz (80.559 kg) 174 lb 12.8 oz (79.289 kg)    Labs  CBC  Recent Labs  10/17/15 1928 10/18/15 0530  WBC 7.1 6.5  HGB 11.7* 10.5*  HCT 36.4* 33.5*  MCV 87.3 87.9  PLT 203 202   Basic Metabolic Panel  Recent Labs  10/17/15 1200 10/17/15 1928 10/18/15 0530  NA 140  --  142  K 5.4*  --  4.3  CL 110  --  112*  CO2 23  --  22  GLUCOSE 98  --  88  BUN 34*  --  29*  CREATININE 1.58* 1.50* 1.47*  CALCIUM 9.4  --  9.4   Liver Function Tests No results for  input(s): AST, ALT, ALKPHOS, BILITOT, PROT, ALBUMIN in the last 72 hours. No results for input(s): LIPASE, AMYLASE in the last 72 hours. Cardiac Enzymes  Recent Labs  10/17/15 1928 10/18/15 0006 10/18/15 0530  TROPONINI <0.03 <0.03 <0.03  Fasting Lipid Panel  Recent Labs  10/18/15 0530  CHOL 153  HDL 55  LDLCALC 79  TRIG 96  CHOLHDL 2.8   Thyroid Function Tests  Recent Labs  10/17/15 1928  TSH 1.844    Disposition  Pt is being discharged home today in good condition.  Follow-up Plans & Appointments  Follow-up Information    Follow up with Rollene Rotunda, MD. Go on 11/29/2015.   Specialty:  Cardiology   Why:  @3 :45 post hospital   Contact information:   [address] [place] [address] [phone]           Discharge Instructions    Diet - low sodium heart healthy    Complete by:  As directed      Increase activity slowly    Complete by:  As directed            F/u Labs/Studies: None  Discharge Medications    Medication List    TAKE these medications        aspirin 81 MG tablet  Take 1 tablet (81 mg total) by mouth daily.     BIOFLEX Tabs  Take 1 tablet by mouth daily.     ferrous gluconate 324 MG tablet  Commonly known as:  FERGON  Take 324 mg by mouth daily with breakfast.     isosorbide mononitrate 30 MG 24 hr tablet  Commonly known as:  IMDUR  Take 1 tablet (30 mg total) by mouth daily.     levothyroxine 75 MCG tablet  Commonly known as:  SYNTHROID, LEVOTHROID  TAKE 1 TABLET DAILY     multivitamin with minerals Tabs tablet  Take 1 tablet by mouth daily.     nitroGLYCERIN 0.4 MG SL tablet  Commonly known as:  NITROSTAT  PLACE 1 TABLET UNDER THE TONGUE AT ONSET OF CHEST PAIN EVERY 5 MINTUES UP TO 3 TIMES AS NEEDED     pantoprazole 40 MG tablet  Commonly known as:  PROTONIX  Take 1 tablet (40 mg total) by mouth daily.     Vitamin D3 2000 units Tabs  Take 2,000 Units by mouth daily.        Duration of Discharge  Encounter   Greater than 30 minutes including physician time.  Signed, Estee Yohe PA-C 10/18/2015, 10:36 AM

## 2015-10-18 NOTE — Plan of Care (Signed)
Problem: Health Behavior/Discharge Planning: Goal: Ability to manage health-related needs will improve Outcome: Completed/Met Date Met:  10/18/15 Pt educated on compliance of treatment plan and medications. Pt verbalized understanding.

## 2015-10-18 NOTE — Care Management Obs Status (Signed)
[facility] [facility] NOTIFICATION   Patient Details  Name: Frederick Davis MRN: [id number] Date of Birth: 05/28/1923   Medicare Observation Status Notification Given:  Yes    Bethena Roys, RN 10/18/2015, 11:22 AM

## 2015-10-20 ENCOUNTER — Telehealth: Payer: Self-pay | Admitting: *Deleted

## 2015-10-20 NOTE — Telephone Encounter (Signed)
Call Completed and Appointment Scheduled: Yes, Date: 10/26/15 Dr Anabel Halon INFORMATION Date of Discharge:10/18/15  Discharge Facility: [facility]  Principal Discharge Diagnosis: Chest Pain  Patient and/or caregiver is knowledgeable of his/her condition(s) and treatment: Yes   MEDICATION RECONCILIATION Medication list reviewed with patient: Yes  Patient is able to obtain needed medications: Yes   ACTIVITIES OF DAILY LIVING  Is the patient able to perform his/her own ADLs: Yes  Patient is receiving home health services: no   PATIENT EDUCATION Questions/Concerns Discussed: No questions or concerns at this time. Wife states that he is doing well since discharge. They will follow up sooner if necessary.

## 2015-10-22 DIAGNOSIS — I219 Acute myocardial infarction, unspecified: Secondary | ICD-10-CM

## 2015-10-22 HISTORY — DX: Acute myocardial infarction, unspecified: I21.9

## 2015-10-26 ENCOUNTER — Encounter: Payer: Self-pay | Admitting: Family Medicine

## 2015-10-26 ENCOUNTER — Ambulatory Visit (INDEPENDENT_AMBULATORY_CARE_PROVIDER_SITE_OTHER): Payer: Medicare Other | Admitting: Family Medicine

## 2015-10-26 VITALS — BP 128/80 | HR 71 | Temp 96.8°F | Ht 66.0 in | Wt 177.0 lb

## 2015-10-26 DIAGNOSIS — I440 Atrioventricular block, first degree: Secondary | ICD-10-CM

## 2015-10-26 DIAGNOSIS — I10 Essential (primary) hypertension: Secondary | ICD-10-CM | POA: Diagnosis not present

## 2015-10-26 DIAGNOSIS — I2510 Atherosclerotic heart disease of native coronary artery without angina pectoris: Secondary | ICD-10-CM

## 2015-10-26 DIAGNOSIS — N183 Chronic kidney disease, stage 3 unspecified: Secondary | ICD-10-CM

## 2015-10-26 NOTE — Progress Notes (Signed)
Subjective:    Patient ID: Frederick Davis, male    DOB: 20-Feb-1923, 82 y.o.   MRN: [id number]  HPI Patient here today for hospital follow up/ transitional care. He was admitted to [facility] for one night, he was discharged on 10/18/15. He has been doing well since his discharge from the hospital and his episode of angina. He is taking his into her and his proton ask. He has had no chest pain or no further episodes of shortness of breath. He's had no chest tightness which is what he was experiencing the day he was sent to the hospital. His blood pressure readings at home have been slightly increased according to the patient and his wife but they have not been checking them regularly. His initial blood pressure here was high at 148/90 but a repeat by me was 128/80 in the left arm sitting with a regular cuff. He has a follow-up appointment with the cardiologist in February. He is aware that he was in the hospital and saw him in the hospital. On review of systems the patient is having no problems with his GI tract or passing his water.    Patient Active Problem List   Diagnosis Date Noted  . Anginal pain ([place]) 10/17/2015  . Rectal cancer ([place]) 07/19/2013  . Hyperlipidemia 07/19/2013  . Hypotension arterial 03/18/2013  . Complete heart block ([place]) 03/18/2013  . Anemia   . CKD (chronic kidney disease) stage 3, GFR 30-59 ml/min 03/12/2013  . Acute MI, inferior wall, initial episode of care ([place]) 03/12/2013  . Impotence 03/12/2013  . History of renal stone 03/12/2013  . Hiatal hernia 03/12/2013  . H/O lithotripsy 03/12/2013  . SCCA (squamous cell carcinoma) of skin 03/12/2013  . S/P prostatectomy, radical 03/12/2013  . Left renal mass 03/12/2013  . Arthritis 03/12/2013  . ST elevation myocardial infarction (STEMI) of inferior wall ([place]) 03/12/2013  . Coronary atherosclerosis of native coronary artery 03/12/2013  . Cardiogenic shock ([place]) 03/12/2013  . Acute respiratory failure ([place])  03/12/2013  . Altered mental status 03/12/2013  . Essential hypertension, benign 01/14/2013  . Colostomy in place [facility]) 01/14/2013  . Prostate cancer ([place]) 01/14/2013  . Renal cell carcinoma, Right 01/14/2013  . Osteoporosis, unspecified 01/14/2013   Outpatient Encounter Prescriptions as of 10/26/2015  Medication Sig  . aspirin 81 MG tablet Take 1 tablet (81 mg total) by mouth daily.  Marland Kitchen Bioflavonoid Products (BIOFLEX) TABS Take 1 tablet by mouth daily.  . Cholecalciferol (VITAMIN D3) 2000 UNITS TABS Take 2,000 Units by mouth daily.  . ferrous gluconate (FERGON) 324 MG tablet Take 324 mg by mouth daily with breakfast.  . isosorbide mononitrate (IMDUR) 30 MG 24 hr tablet Take 1 tablet (30 mg total) by mouth daily.  Marland Kitchen levothyroxine (SYNTHROID, LEVOTHROID) 75 MCG tablet TAKE 1 TABLET DAILY  . Multiple Vitamin (MULITIVITAMIN WITH MINERALS) TABS Take 1 tablet by mouth daily.  . nitroGLYCERIN (NITROSTAT) 0.4 MG SL tablet PLACE 1 TABLET UNDER THE TONGUE AT ONSET OF CHEST PAIN EVERY 5 MINTUES UP TO 3 TIMES AS NEEDED  . pantoprazole (PROTONIX) 40 MG tablet Take 1 tablet (40 mg total) by mouth daily.   Facility-Administered Encounter Medications as of 10/26/2015  Medication  . etomidate (AMIDATE) injection  . lidocaine (cardiac) 100 mg/19ml (XYLOCAINE) 20 MG/ML injection 2%  . succinylcholine (ANECTINE) injection      Review of Systems  Constitutional: Negative.   HENT: Negative.   Eyes: Negative.   Respiratory: Negative.   Cardiovascular: Negative.  Gastrointestinal: Negative.   Endocrine: Negative.   Genitourinary: Negative.   Musculoskeletal: Negative.   Skin: Negative.   Allergic/Immunologic: Negative.   Neurological: Negative.   Hematological: Negative.   Psychiatric/Behavioral: Negative.        Objective:   Physical Exam  Constitutional: He is oriented to person, place, and time. He appears well-developed and well-nourished. No distress.  Alert and in good spirits  HENT:    Head: Normocephalic and atraumatic.  Eyes: Conjunctivae and EOM are normal. Pupils are equal, round, and reactive to light. Right eye exhibits no discharge. Left eye exhibits no discharge. No scleral icterus.  Neck: Normal range of motion. Neck supple. No thyromegaly present.  Cardiovascular: Normal rate, regular rhythm and intact distal pulses.   Murmur heard. The heart was only slightly irregular at 72/m. There was one short pause. There is a grade 2/6 systolic ejection murmur.  Pulmonary/Chest: Effort normal and breath sounds normal. No respiratory distress. He has no wheezes. He has no rales. He exhibits no tenderness.  Abdominal: Soft. Bowel sounds are normal. There is no tenderness. There is no rebound and no guarding.  Musculoskeletal: Normal range of motion. He exhibits no edema.  Lymphadenopathy:    He has no cervical adenopathy.  Neurological: He is alert and oriented to person, place, and time.  Skin: Skin is warm and dry. No rash noted.  Psychiatric: He has a normal mood and affect. His behavior is normal. Judgment and thought content normal.  Nursing note and vitals reviewed.  BP 148/90 mmHg  Pulse 71  Temp(Src) 96.8 F (36 C) (Oral)  Ht 5\' 6"  (1.676 m)  Wt 177 lb (80.287 kg)  BMI 28.58 kg/m2        Assessment & Plan:  1. ASCVD (arteriosclerotic cardiovascular disease) -No further episodes of chest tightness with the addition of imdur -Follow-up with cardiology as planned  2. First degree AV block  3. Essential hypertension, benign -Repeat blood pressure was good at 128/80 and we will have the patient monitor blood pressures more closely at home and bring these readings in for review since the initial blood pressure was elevated.  4. CKD (chronic kidney disease) stage 3, GFR 30-59 ml/min -Continue to watch sodium intake and monitor blood pressure  Patient Instructions  The patient should continue with his current medications He should monitor his blood  pressure at home and bring readings back for review and 7-10 days He should keep his appointment with the cardiologist and also take blood pressure readings to see him He should in creases activity gradually and continue to watch his sodium intake   Arrie Senate MD

## 2015-10-26 NOTE — Patient Instructions (Signed)
The patient should continue with his current medications He should monitor his blood pressure at home and bring readings back for review and 7-10 days He should keep his appointment with the cardiologist and also take blood pressure readings to see him He should in creases activity gradually and continue to watch his sodium intake

## 2015-11-01 ENCOUNTER — Encounter: Payer: Self-pay | Admitting: Family Medicine

## 2015-11-01 ENCOUNTER — Ambulatory Visit (INDEPENDENT_AMBULATORY_CARE_PROVIDER_SITE_OTHER): Payer: Medicare Other | Admitting: Family Medicine

## 2015-11-01 VITALS — BP 131/78 | HR 94 | Temp 98.4°F | Ht 66.0 in | Wt 178.6 lb

## 2015-11-01 DIAGNOSIS — I2510 Atherosclerotic heart disease of native coronary artery without angina pectoris: Secondary | ICD-10-CM

## 2015-11-01 DIAGNOSIS — J209 Acute bronchitis, unspecified: Secondary | ICD-10-CM | POA: Diagnosis not present

## 2015-11-01 MED ORDER — AZITHROMYCIN 250 MG PO TABS: ORAL_TABLET | ORAL | Status: DC

## 2015-11-01 MED ORDER — ALBUTEROL SULFATE HFA 108 (90 BASE) MCG/ACT IN AERS: 2.0000 | INHALATION_SPRAY | Freq: Four times a day (QID) | RESPIRATORY_TRACT | Status: DC | PRN

## 2015-11-01 MED ORDER — BENZONATATE 100 MG PO CAPS: 100.0000 mg | ORAL_CAPSULE | Freq: Two times a day (BID) | ORAL | Status: DC | PRN

## 2015-11-01 NOTE — Progress Notes (Signed)
BP 131/78 mmHg  Pulse 94  Temp(Src) 98.4 F (36.9 C) (Oral)  Ht 5\' 6"  (1.676 m)  Wt 178 lb 9.6 oz (81.012 kg)  BMI 28.84 kg/m2   Subjective:    Patient ID: Frederick Davis, male    DOB: 05-09-1923, 82 y.o.   MRN: [id number]  HPI: Frederick Davis is a 82 y.o. male presenting on 11/01/2015 for Bronchitis; Cough; and Sinusitis   HPI Cough and chest congestion  Patient has been having cough and chest congestion and sinus drainage and postnasal drainage for the past 8 or 9 days. He denies any fevers or chills. His cough is nonproductive at this point. He has had continuous nasal drainage which is clear to white. He denies any shortness of breath or wheezing but does have congestion that starting to go down into his chest and causing him to have more coughing spells.  Relevant past medical, surgical, family and social history reviewed and updated as indicated. Interim medical history since our last visit reviewed. Allergies and medications reviewed and updated.  Review of Systems  Constitutional: Negative for fever and chills.  HENT: Positive for congestion, postnasal drip, rhinorrhea, sinus pressure and sore throat. Negative for ear discharge, ear pain, sneezing and voice change.   Eyes: Negative for pain, discharge, redness and visual disturbance.  Respiratory: Positive for cough. Negative for chest tightness, shortness of breath and wheezing.   Cardiovascular: Negative for chest pain, palpitations and leg swelling.  Gastrointestinal: Negative for abdominal pain, diarrhea and constipation.  Genitourinary: Negative for difficulty urinating.  Musculoskeletal: Negative for back pain and gait problem.  Skin: Negative for rash.  Neurological: Negative for syncope, light-headedness and headaches.  All other systems reviewed and are negative.   Per HPI unless specifically indicated above     Medication List       This list is accurate as of: 11/01/15  8:57 AM.  Always use your  most recent med list.               albuterol 108 (90 Base) MCG/ACT inhaler  Commonly known as:  PROVENTIL HFA;VENTOLIN HFA  Inhale 2 puffs into the lungs every 6 (six) hours as needed for wheezing or shortness of breath.     aspirin 81 MG tablet  Take 1 tablet (81 mg total) by mouth daily.     azithromycin 250 MG tablet  Commonly known as:  ZITHROMAX  Take 2 the first day and then one each day after.     BIOFLEX Tabs  Take 1 tablet by mouth daily.     ferrous gluconate 324 MG tablet  Commonly known as:  FERGON  Take 324 mg by mouth daily with breakfast.     isosorbide mononitrate 30 MG 24 hr tablet  Commonly known as:  IMDUR  Take 1 tablet (30 mg total) by mouth daily.     levothyroxine 75 MCG tablet  Commonly known as:  SYNTHROID, LEVOTHROID  TAKE 1 TABLET DAILY     multivitamin with minerals Tabs tablet  Take 1 tablet by mouth daily.     nitroGLYCERIN 0.4 MG SL tablet  Commonly known as:  NITROSTAT  PLACE 1 TABLET UNDER THE TONGUE AT ONSET OF CHEST PAIN EVERY 5 MINTUES UP TO 3 TIMES AS NEEDED     pantoprazole 40 MG tablet  Commonly known as:  PROTONIX  Take 1 tablet (40 mg total) by mouth daily.     Vitamin D3 2000 units Tabs  Take 2,000 Units  by mouth daily.           Objective:    BP 131/78 mmHg  Pulse 94  Temp(Src) 98.4 F (36.9 C) (Oral)  Ht 5\' 6"  (1.676 m)  Wt 178 lb 9.6 oz (81.012 kg)  BMI 28.84 kg/m2  Wt Readings from Last 3 Encounters:  11/01/15 178 lb 9.6 oz (81.012 kg)  10/26/15 177 lb (80.287 kg)  10/18/15 174 lb 12.8 oz (79.289 kg)    Physical Exam  Constitutional: He is oriented to person, place, and time. He appears well-developed and well-nourished. No distress.  HENT:  Right Ear: Tympanic membrane, external ear and ear canal normal.  Left Ear: Tympanic membrane, external ear and ear canal normal.  Nose: Mucosal edema and rhinorrhea present. No sinus tenderness. No epistaxis. Right sinus exhibits no maxillary sinus tenderness and  no frontal sinus tenderness. Left sinus exhibits no maxillary sinus tenderness and no frontal sinus tenderness.  Mouth/Throat: Uvula is midline and mucous membranes are normal. Posterior oropharyngeal edema and posterior oropharyngeal erythema present. No oropharyngeal exudate or tonsillar abscesses.  Eyes: Conjunctivae and EOM are normal. Pupils are equal, round, and reactive to light. Right eye exhibits no discharge. No scleral icterus.  Neck: Neck supple. No thyromegaly present.  Cardiovascular: Normal rate, regular rhythm, normal heart sounds and intact distal pulses.   No murmur heard. Pulmonary/Chest: Effort normal. No respiratory distress. He has no decreased breath sounds. He has no wheezes. He has rhonchi in the right upper field and the left upper field.  Musculoskeletal: Normal range of motion. He exhibits no edema.  Lymphadenopathy:    He has no cervical adenopathy.  Neurological: He is alert and oriented to person, place, and time. Coordination normal.  Skin: Skin is warm and dry. No rash noted. He is not diaphoretic.  Psychiatric: He has a normal mood and affect. His behavior is normal.  Nursing note and vitals reviewed.      Assessment & Plan:   Problem List Items Addressed This Visit    None    Visit Diagnoses    Acute bronchitis, unspecified organism    -  Primary    Relevant Medications    albuterol (PROVENTIL HFA;VENTOLIN HFA) 108 (90 Base) MCG/ACT inhaler    azithromycin (ZITHROMAX) 250 MG tablet    benzonatate (TESSALON) 100 MG capsule        Follow up plan: Return if symptoms worsen or fail to improve.  Counseling provided for all of the vaccine components No orders of the defined types were placed in this encounter.    Caryl Pina, MD [facility] 11/01/2015, 8:57 AM

## 2015-11-07 ENCOUNTER — Encounter: Payer: Self-pay | Admitting: Family Medicine

## 2015-11-07 ENCOUNTER — Ambulatory Visit (INDEPENDENT_AMBULATORY_CARE_PROVIDER_SITE_OTHER): Payer: Medicare Other | Admitting: Family Medicine

## 2015-11-07 ENCOUNTER — Ambulatory Visit (INDEPENDENT_AMBULATORY_CARE_PROVIDER_SITE_OTHER): Payer: Medicare Other

## 2015-11-07 VITALS — BP 119/76 | HR 69 | Temp 97.5°F | Ht 66.0 in | Wt 173.0 lb

## 2015-11-07 DIAGNOSIS — R059 Cough, unspecified: Secondary | ICD-10-CM

## 2015-11-07 DIAGNOSIS — J069 Acute upper respiratory infection, unspecified: Secondary | ICD-10-CM

## 2015-11-07 DIAGNOSIS — R05 Cough: Secondary | ICD-10-CM | POA: Diagnosis not present

## 2015-11-07 DIAGNOSIS — R053 Chronic cough: Secondary | ICD-10-CM

## 2015-11-07 DIAGNOSIS — I2510 Atherosclerotic heart disease of native coronary artery without angina pectoris: Secondary | ICD-10-CM | POA: Diagnosis not present

## 2015-11-07 NOTE — Progress Notes (Signed)
Subjective:    Patient ID: Frederick Davis, male    DOB: [birth year], 82 y.o.   MRN: [id number]  HPI Patient here today for lingering symptoms of URI. He is still having cough and congestion. This patient continues to have cough and congestion. He is finished his Z-Pak. He was recently discharged from the hospital because of a severe episode of angina. The patient says he may be doing a little better you know he is not 10 days out from his Z-Pak. He has not been running a fever or coughing up any purulent sputum. He says the congestion seems to be back up in his head more than in his chest currently.     Patient Active Problem List   Diagnosis Date Noted  . Anginal pain ([place]) 10/17/2015  . Rectal cancer ([place]) 07/19/2013  . Hyperlipidemia 07/19/2013  . Hypotension arterial 03/18/2013  . Complete heart block ([place]) 03/18/2013  . Anemia   . CKD (chronic kidney disease) stage 3, GFR 30-59 ml/min 03/12/2013  . Acute MI, inferior wall, initial episode of care ([place]) 03/12/2013  . Impotence 03/12/2013  . History of renal stone 03/12/2013  . Hiatal hernia 03/12/2013  . H/O lithotripsy 03/12/2013  . SCCA (squamous cell carcinoma) of skin 03/12/2013  . S/P prostatectomy, radical 03/12/2013  . Left renal mass 03/12/2013  . Arthritis 03/12/2013  . ST elevation myocardial infarction (STEMI) of inferior wall ([place]) 03/12/2013  . Coronary atherosclerosis of native coronary artery 03/12/2013  . Cardiogenic shock ([place]) 03/12/2013  . Acute respiratory failure ([place]) 03/12/2013  . Altered mental status 03/12/2013  . Essential hypertension, benign 01/14/2013  . Colostomy in place [facility]) 01/14/2013  . Prostate cancer ([place]) 01/14/2013  . Renal cell carcinoma, Right 01/14/2013  . Osteoporosis, unspecified 01/14/2013   Outpatient Encounter Prescriptions as of 11/07/2015  Medication Sig  . albuterol (PROVENTIL HFA;VENTOLIN HFA) 108 (90 Base) MCG/ACT inhaler Inhale 2 puffs into the lungs every 6 (six)  hours as needed for wheezing or shortness of breath.  Marland Kitchen aspirin 81 MG tablet Take 1 tablet (81 mg total) by mouth daily.  . benzonatate (TESSALON) 100 MG capsule Take 1 capsule (100 mg total) by mouth 2 (two) times daily as needed for cough.  Marland Kitchen Bioflavonoid Products (BIOFLEX) TABS Take 1 tablet by mouth daily.  . Cholecalciferol (VITAMIN D3) 2000 UNITS TABS Take 2,000 Units by mouth daily.  . ferrous gluconate (FERGON) 324 MG tablet Take 324 mg by mouth daily with breakfast.  . isosorbide mononitrate (IMDUR) 30 MG 24 hr tablet Take 1 tablet (30 mg total) by mouth daily.  Marland Kitchen levothyroxine (SYNTHROID, LEVOTHROID) 75 MCG tablet TAKE 1 TABLET DAILY  . Multiple Vitamin (MULITIVITAMIN WITH MINERALS) TABS Take 1 tablet by mouth daily.  . nitroGLYCERIN (NITROSTAT) 0.4 MG SL tablet PLACE 1 TABLET UNDER THE TONGUE AT ONSET OF CHEST PAIN EVERY 5 MINTUES UP TO 3 TIMES AS NEEDED  . pantoprazole (PROTONIX) 40 MG tablet Take 1 tablet (40 mg total) by mouth daily.  . [DISCONTINUED] azithromycin (ZITHROMAX) 250 MG tablet Take 2 the first day and then one each day after.   Facility-Administered Encounter Medications as of 11/07/2015  Medication  . etomidate (AMIDATE) injection  . lidocaine (cardiac) 100 mg/66m (XYLOCAINE) 20 MG/ML injection 2%  . succinylcholine (ANECTINE) injection      Review of Systems  Constitutional: Negative.   HENT: Positive for congestion.   Eyes: Negative.   Respiratory: Positive for cough.   Cardiovascular: Negative.   Gastrointestinal: Negative.  Endocrine: Negative.   Genitourinary: Negative.   Musculoskeletal: Negative.   Skin: Negative.   Allergic/Immunologic: Negative.   Neurological: Negative.   Hematological: Negative.   Psychiatric/Behavioral: Negative.        Objective:   Physical Exam  Constitutional: He is oriented to person, place, and time. He appears well-developed and well-nourished. No distress.  HENT:  Head: Normocephalic and atraumatic.    Mouth/Throat: Oropharynx is clear and moist. No oropharyngeal exudate.  Bilateral ears cerumen  Eyes: Conjunctivae and EOM are normal. Pupils are equal, round, and reactive to light. Right eye exhibits no discharge. Left eye exhibits no discharge. No scleral icterus.  Neck: Normal range of motion. Neck supple. No thyromegaly present.  Cardiovascular: Normal rate, regular rhythm and normal heart sounds.   No murmur heard. Pulmonary/Chest: Effort normal and breath sounds normal. No respiratory distress. He has no wheezes. He has no rales. He exhibits no tenderness.  Mild upper airway congestion with coughing No rales or rhonchi  Abdominal: He exhibits no mass.  Musculoskeletal: Normal range of motion. He exhibits no edema.  Lymphadenopathy:    He has no cervical adenopathy.  Neurological: He is alert and oriented to person, place, and time.  Skin: Skin is warm and dry. No rash noted. No pallor.  Psychiatric: He has a normal mood and affect. His behavior is normal. Judgment and thought content normal.  Nursing note and vitals reviewed.  BP 119/76 mmHg  Pulse 69  Temp(Src) 97.5 F (36.4 C) (Oral)  Ht '5\' 6"'  (1.676 m)  Wt 173 lb (78.472 kg)  BMI 27.94 kg/m2        Assessment & Plan:  1. Cough -Continue with Mucinex maximum strength and use nasal saline frequently - BMP8+EGFR - CBC with Differential/Platelet - DG Chest 2 View; Future  2. Persistent cough -Await results of lab work and chest x-ray  3. URI (upper respiratory infection) -If CBC has an elevated white count or any congestion on the chest x-ray different from the past consider Augmentin  Patient Instructions  Continue to drink plenty of fluids Use a cool mist humidifier in the bedroom at nighttime Take Mucinex, maximum strength, blue and white in color, 1 twice daily with a large glass of water Use nasal saline frequently through the day in each nostril and use nasal saline gel at nighttime If condition worsens  get back in touch with [place] immediately We will call you the results of the chest x-ray and CBC results as it becomes available   Arrie Senate MD

## 2015-11-07 NOTE — Patient Instructions (Signed)
Continue to drink plenty of fluids Use a cool mist humidifier in the bedroom at nighttime Take Mucinex, maximum strength, blue and white in color, 1 twice daily with a large glass of water Use nasal saline frequently through the day in each nostril and use nasal saline gel at nighttime If condition worsens get back in touch with [place] immediately We will call you the results of the chest x-ray and CBC results as it becomes available

## 2015-11-08 LAB — BMP8+EGFR
BUN/Creatinine Ratio: 21 (ref 10–22)
BUN: 30 mg/dL (ref 10–36)
CALCIUM: 9.2 mg/dL (ref 8.6–10.2)
CHLORIDE: 105 mmol/L (ref 96–106)
CO2: 17 mmol/L — AB (ref 18–29)
Creatinine, Ser: 1.44 mg/dL — ABNORMAL HIGH (ref 0.76–1.27)
GFR calc Af Amer: 49 mL/min/{1.73_m2} — ABNORMAL LOW (ref >59)
GFR calc non Af Amer: 42 mL/min/{1.73_m2} — ABNORMAL LOW (ref >59)
GLUCOSE: 118 mg/dL — AB (ref 65–99)
POTASSIUM: 4.8 mmol/L (ref 3.5–5.2)
Sodium: 137 mmol/L (ref 134–144)

## 2015-11-08 LAB — CBC WITH DIFFERENTIAL/PLATELET
BASOS: 0 %
Basophils Absolute: 0 10*3/uL (ref 0.0–0.2)
EOS (ABSOLUTE): 0.1 10*3/uL (ref 0.0–0.4)
EOS: 2 %
HEMATOCRIT: 33.8 % — AB (ref 37.5–51.0)
HEMOGLOBIN: 11.4 g/dL — AB (ref 12.6–17.7)
IMMATURE GRANULOCYTES: 0 %
Immature Grans (Abs): 0 10*3/uL (ref 0.0–0.1)
Lymphocytes Absolute: 1.4 10*3/uL (ref 0.7–3.1)
Lymphs: 19 %
MCH: 28 pg (ref 26.6–33.0)
MCHC: 33.7 g/dL (ref 31.5–35.7)
MCV: 83 fL (ref 79–97)
MONOS ABS: 0.5 10*3/uL (ref 0.1–0.9)
Monocytes: 7 %
NEUTROS PCT: 72 %
Neutrophils Absolute: 5.4 10*3/uL (ref 1.4–7.0)
Platelets: 241 10*3/uL (ref 150–379)
RBC: 4.07 x10E6/uL — ABNORMAL LOW (ref 4.14–5.80)
RDW: 13.8 % (ref 12.3–15.4)
WBC: 7.5 10*3/uL (ref 3.4–10.8)

## 2015-11-29 ENCOUNTER — Encounter: Payer: Self-pay | Admitting: Cardiology

## 2015-11-29 ENCOUNTER — Ambulatory Visit (INDEPENDENT_AMBULATORY_CARE_PROVIDER_SITE_OTHER): Payer: Medicare Other | Admitting: Cardiology

## 2015-11-29 VITALS — BP 129/86 | HR 73 | Ht 66.0 in | Wt 177.0 lb

## 2015-11-29 DIAGNOSIS — I2510 Atherosclerotic heart disease of native coronary artery without angina pectoris: Secondary | ICD-10-CM

## 2015-11-29 DIAGNOSIS — I200 Unstable angina: Secondary | ICD-10-CM | POA: Diagnosis not present

## 2015-11-29 NOTE — Progress Notes (Signed)
HPI The patient presents for followup after a acute inferior wall infarct in May of 2014. He had thrombectomy of a right coronary artery occlusion. He was initially acute respiratory failure and was cooled. However, he responded very well to therapy.   He was in the hospital in January with chest discomfort. Cardiac enzymes were negative. He was managed medically. He returns for follow-up. He says he did have chest pain again severely on the morning of January 28. This was at rest. This lasted for a few hours. He did not seek transport to the emergency room. He did take one nitroglycerin and his symptoms resolved spontaneously. He's been able to be out doing some activities such as a little golfing. However, he said he said more fatigue than he did previously. He's not having any PND or orthopnea. He's not having any palpitations, presyncope or syncope.   Allergies  Allergen Reactions  . Lipitor [Atorvastatin] Other (See Comments)    Muscle aches    Prior to Admission medications   Medication Sig Start Date End Date Taking? Authorizing Provider  aspirin 81 MG tablet Take 1 tablet (81 mg total) by mouth daily. 03/18/13  Yes Rhonda G Barrett, PA-C  Bioflavonoid Products (BIOFLEX) TABS Take 1 tablet by mouth daily.   Yes Historical Provider, MD  Cholecalciferol (VITAMIN D3) 2000 UNITS TABS Take 2,000 Units by mouth daily.   Yes Historical Provider, MD  ferrous gluconate (FERGON) 324 MG tablet Take 324 mg by mouth daily with breakfast.   Yes Historical Provider, MD  isosorbide mononitrate (IMDUR) 30 MG 24 hr tablet Take 1 tablet (30 mg total) by mouth daily. 10/18/15  Yes Bhavinkumar Bhagat, PA  levothyroxine (SYNTHROID, LEVOTHROID) 75 MCG tablet TAKE 1 TABLET DAILY 03/13/15  Yes Chipper Herb, MD  Multiple Vitamin (MULITIVITAMIN WITH MINERALS) TABS Take 1 tablet by mouth daily.   Yes Historical Provider, MD  nitroGLYCERIN (NITROSTAT) 0.4 MG SL tablet PLACE 1 TABLET UNDER THE TONGUE AT ONSET OF  CHEST PAIN EVERY 5 MINTUES UP TO 3 TIMES AS NEEDED 02/13/15  Yes Chipper Herb, MD  pantoprazole (PROTONIX) 40 MG tablet Take 1 tablet (40 mg total) by mouth daily. 10/18/15  Yes Leanor Kail, PA     Past Medical History  Diagnosis Date  . Hypothyroidism   . Hypertension   . Rectum cancer ([place]) 1986  . PC (prostate cancer) ([place]) 1987  . Renal cell cancer ([place])   . Osteoporosis   . Esophageal stricture   . Anemia   . CAD (coronary artery disease)     Inferior MI.  LAD mid 80% stenosis, circ mild plaque, RCA 100% with thrombectomy PTCA.  EF 65%  . CKD (chronic kidney disease)   . Myocardial infarction [facility]) 03/12/13    Itzia Cunliffe-cardiologist    Past Surgical History  Procedure Laterality Date  . Kidney surgery  2004  . Radiofrequency ablation kidney  2006  . Hernia repair      2007  . Renal sphincter appliance  2007  . Colonoscopy  01/08/2012    Procedure: COLONOSCOPY;  Surgeon: Rogene Houston, MD;  Location[facility] ENDO [facility];  Service: Endoscopy;  Laterality: N/A;  1030  . Colostomy Left 1986    colon cancer  . Percutaneous coronary intervention-balloon only    . Left heart catheterization with coronary angiogram N/A 03/12/2013    Procedure: STEMI;  Surgeon: Burnell Blanks, MD;  Location: [facility] CATH LAB;  Service: Cardiovascular;  Laterality: N/A;  . Prostatectomy    .  Right inguinal hernia repair    . Colonoscopy N/A 07/20/2015    Procedure: COLONOSCOPY;  Surgeon: Rogene Houston, MD;  Location: [facility] ENDO SU[facility];  Service: Endoscopy;  Laterality: N/A;  930 - moved to 9/29 @ 2:00    ROS:  As stated in the HPI and negative for all other systems.  PHYSICAL EXAM BP 129/86 mmHg  Pulse 73  Ht 5\' 6"  (1.676 m)  Wt 177 lb (80.287 kg)  BMI 28.58 kg/m2  SpO2 96% GENERAL:  Well appearing, acts younger than his stated age. NECK:  No jugular venous distention, waveform within normal limits, carotid upstroke brisk and symmetric, no bruits, no thyromegaly LYMPHATICS:  No  cervical, inguinal adenopathy LUNGS:  Clear to auscultation bilaterally BACK:  No CVA tenderness, mild lordosis CHEST:  Unremarkable HEART:  PMI not displaced or sustained,S1 and S2 within normal limits, no S3, no S4, no clicks, no rubs, no murmurs ABD:  Flat, positive bowel sounds normal in frequency in pitch, no bruits, no rebound, no guarding, no midline pulsatile mass, no hepatomegaly, no splenomegaly, , colostomy bag in place EXT:  2 plus pulses throughout, no edema, no cyanosis no clubbing NEURO:  Non focal   ASSESSMENT AND PLAN  Chest puressure:   Today I reviewed his films. He's had another episode of significant chest discomfort at rest. This clearly could be related to the severe LAD stenosis that was seen in 2014. I think cardiac catheterization is indicated. The patient understands that risks included but are not limited to stroke (1 in 1000), death (1 in 27), kidney failure [usually temporary] (1 in 500), bleeding (1 in 200), allergic reaction [possibly serious] (1 in 200).  The patient understands and agrees to proceed.   He does have a mildly elevated creatinine and a single kidney. I would like to admit him for hydration the night before the procedure.  CAD As above.  AS This was mild to moderate on echo in 2014. I like to repeat this on the day of admission prior to his catheterization. If there's been any significant progression with consider right heart catheterization.  HTN Stable and well controlled.  Continue current meds.  CDK, stage III This will be followed closely.  Anemia He has had chronic anemia with no recent history of active bleeding.  Cath films reviewed today personally.

## 2015-11-29 NOTE — Patient Instructions (Signed)
Medications: The current medical regimen is effective;  continue present plan and medications.  Testing/Procedures: Your physician has requested that you have a cardiac catheterization. Cardiac catheterization is used to diagnose and/or treat various heart conditions. Doctors may recommend this procedure for a number of different reasons. The most common reason is to evaluate chest pain. Chest pain can be a symptom of coronary artery disease (CAD), and cardiac catheterization can show whether plaque is narrowing or blocking your heart's arteries. This procedure is also used to evaluate the valves, as well as measure the blood flow and oxygen levels in different parts of your heart. For further information please visit [email or web address]. Please follow instruction sheet, as given. You will be admitted 12/05/15 for the procedure on 12/06/15.    Follow-Up: Follow up after your cardiac cath.  If you need a refill on your cardiac medications before your next appointment, please call your pharmacy.  Thank you for choosing [facility]!!

## 2015-11-30 ENCOUNTER — Encounter: Payer: Self-pay | Admitting: Cardiology

## 2015-12-05 ENCOUNTER — Inpatient Hospital Stay (HOSPITAL_COMMUNITY)
Admission: RE | Admit: 2015-12-05 | Discharge: 2015-12-14 | DRG: 234 | Disposition: A | Discharge status: Home Health | Payer: Medicare Other | Source: Ambulatory Visit | Attending: Thoracic Surgery (Cardiothoracic Vascular Surgery) | Admitting: Thoracic Surgery (Cardiothoracic Vascular Surgery)

## 2015-12-05 ENCOUNTER — Encounter (HOSPITAL_COMMUNITY): Payer: Self-pay | Admitting: Cardiology

## 2015-12-05 DIAGNOSIS — Z933 Colostomy status: Secondary | ICD-10-CM

## 2015-12-05 DIAGNOSIS — D696 Thrombocytopenia, unspecified: Secondary | ICD-10-CM | POA: Diagnosis not present

## 2015-12-05 DIAGNOSIS — Z7982 Long term (current) use of aspirin: Secondary | ICD-10-CM

## 2015-12-05 DIAGNOSIS — E039 Hypothyroidism, unspecified: Secondary | ICD-10-CM | POA: Diagnosis present

## 2015-12-05 DIAGNOSIS — J9811 Atelectasis: Secondary | ICD-10-CM | POA: Diagnosis not present

## 2015-12-05 DIAGNOSIS — Z951 Presence of aortocoronary bypass graft: Secondary | ICD-10-CM

## 2015-12-05 DIAGNOSIS — Z85528 Personal history of other malignant neoplasm of kidney: Secondary | ICD-10-CM

## 2015-12-05 DIAGNOSIS — Z8546 Personal history of malignant neoplasm of prostate: Secondary | ICD-10-CM | POA: Diagnosis not present

## 2015-12-05 DIAGNOSIS — D62 Acute posthemorrhagic anemia: Secondary | ICD-10-CM | POA: Diagnosis not present

## 2015-12-05 DIAGNOSIS — E785 Hyperlipidemia, unspecified: Secondary | ICD-10-CM | POA: Diagnosis present

## 2015-12-05 DIAGNOSIS — R0602 Shortness of breath: Secondary | ICD-10-CM

## 2015-12-05 DIAGNOSIS — Z9861 Coronary angioplasty status: Secondary | ICD-10-CM

## 2015-12-05 DIAGNOSIS — I440 Atrioventricular block, first degree: Secondary | ICD-10-CM | POA: Diagnosis not present

## 2015-12-05 DIAGNOSIS — K222 Esophageal obstruction: Secondary | ICD-10-CM | POA: Diagnosis present

## 2015-12-05 DIAGNOSIS — N183 Chronic kidney disease, stage 3 unspecified: Secondary | ICD-10-CM | POA: Diagnosis present

## 2015-12-05 DIAGNOSIS — J90 Pleural effusion, not elsewhere classified: Secondary | ICD-10-CM | POA: Diagnosis not present

## 2015-12-05 DIAGNOSIS — Z79899 Other long term (current) drug therapy: Secondary | ICD-10-CM | POA: Diagnosis not present

## 2015-12-05 DIAGNOSIS — I350 Nonrheumatic aortic (valve) stenosis: Secondary | ICD-10-CM | POA: Diagnosis present

## 2015-12-05 DIAGNOSIS — M810 Age-related osteoporosis without current pathological fracture: Secondary | ICD-10-CM | POA: Diagnosis present

## 2015-12-05 DIAGNOSIS — R079 Chest pain, unspecified: Secondary | ICD-10-CM | POA: Diagnosis present

## 2015-12-05 DIAGNOSIS — D649 Anemia, unspecified: Secondary | ICD-10-CM | POA: Diagnosis not present

## 2015-12-05 DIAGNOSIS — I200 Unstable angina: Secondary | ICD-10-CM | POA: Insufficient documentation

## 2015-12-05 DIAGNOSIS — I252 Old myocardial infarction: Secondary | ICD-10-CM | POA: Diagnosis not present

## 2015-12-05 DIAGNOSIS — Z905 Acquired absence of kidney: Secondary | ICD-10-CM

## 2015-12-05 DIAGNOSIS — R0789 Other chest pain: Secondary | ICD-10-CM | POA: Diagnosis not present

## 2015-12-05 DIAGNOSIS — Z888 Allergy status to other drugs, medicaments and biological substances status: Secondary | ICD-10-CM | POA: Diagnosis not present

## 2015-12-05 DIAGNOSIS — I10 Essential (primary) hypertension: Secondary | ICD-10-CM | POA: Diagnosis present

## 2015-12-05 DIAGNOSIS — I080 Rheumatic disorders of both mitral and aortic valves: Secondary | ICD-10-CM | POA: Diagnosis not present

## 2015-12-05 DIAGNOSIS — E8770 Fluid overload, unspecified: Secondary | ICD-10-CM | POA: Diagnosis not present

## 2015-12-05 DIAGNOSIS — I129 Hypertensive chronic kidney disease with stage 1 through stage 4 chronic kidney disease, or unspecified chronic kidney disease: Secondary | ICD-10-CM | POA: Diagnosis present

## 2015-12-05 DIAGNOSIS — Z85048 Personal history of other malignant neoplasm of rectum, rectosigmoid junction, and anus: Secondary | ICD-10-CM

## 2015-12-05 DIAGNOSIS — I2510 Atherosclerotic heart disease of native coronary artery without angina pectoris: Secondary | ICD-10-CM | POA: Diagnosis not present

## 2015-12-05 DIAGNOSIS — Z87891 Personal history of nicotine dependence: Secondary | ICD-10-CM | POA: Diagnosis not present

## 2015-12-05 DIAGNOSIS — I25110 Atherosclerotic heart disease of native coronary artery with unstable angina pectoris: Secondary | ICD-10-CM | POA: Diagnosis not present

## 2015-12-05 DIAGNOSIS — R918 Other nonspecific abnormal finding of lung field: Secondary | ICD-10-CM | POA: Diagnosis not present

## 2015-12-05 HISTORY — DX: Chronic kidney disease, stage 3 (moderate): N18.3

## 2015-12-05 HISTORY — DX: Chronic kidney disease, stage 3 unspecified: N18.30

## 2015-12-05 LAB — CBC
HCT: 37 % — ABNORMAL LOW (ref 39.0–52.0)
Hemoglobin: 11.9 g/dL — ABNORMAL LOW (ref 13.0–17.0)
MCH: 28.2 pg (ref 26.0–34.0)
MCHC: 32.2 g/dL (ref 30.0–36.0)
MCV: 87.7 fL (ref 78.0–100.0)
Platelets: 204 10*3/uL (ref 150–400)
RBC: 4.22 MIL/uL (ref 4.22–5.81)
RDW: 14.5 % (ref 11.5–15.5)
WBC: 8.8 10*3/uL (ref 4.0–10.5)

## 2015-12-05 LAB — CREATININE, SERUM
Creatinine, Ser: 1.66 mg/dL — ABNORMAL HIGH (ref 0.61–1.24)
GFR calc Af Amer: 40 mL/min — ABNORMAL LOW (ref >60)
GFR calc non Af Amer: 34 mL/min — ABNORMAL LOW (ref >60)

## 2015-12-05 MED ORDER — ACETAMINOPHEN 325 MG PO TABS
650.0000 mg | ORAL_TABLET | ORAL | Status: DC | PRN
Start: 2015-12-05 — End: 2015-12-06

## 2015-12-05 MED ORDER — SODIUM CHLORIDE 0.9% FLUSH: 3.0000 mL | Freq: Two times a day (BID) | INTRAVENOUS | Status: DC

## 2015-12-05 MED ORDER — ISOSORBIDE MONONITRATE ER 30 MG PO TB24
30.0000 mg | ORAL_TABLET | Freq: Every day | ORAL | Status: DC
  Administered 2015-12-06 – 2015-12-07 (×2): 30 mg via ORAL
  Filled 2015-12-05 (×2): qty 1

## 2015-12-05 MED ORDER — SODIUM CHLORIDE 0.9% FLUSH: 3.0000 mL | INTRAVENOUS | Status: DC | PRN

## 2015-12-05 MED ORDER — NITROGLYCERIN 0.4 MG SL SUBL: 0.4000 mg | SUBLINGUAL_TABLET | SUBLINGUAL | Status: DC | PRN

## 2015-12-05 MED ORDER — LEVOTHYROXINE SODIUM 75 MCG PO TABS
75.0000 ug | ORAL_TABLET | Freq: Every day | ORAL | Status: DC
  Administered 2015-12-06 – 2015-12-14 (×8): 75 ug via ORAL
  Filled 2015-12-05 (×9): qty 1

## 2015-12-05 MED ORDER — PANTOPRAZOLE SODIUM 40 MG PO TBEC
40.0000 mg | DELAYED_RELEASE_TABLET | Freq: Every day | ORAL | Status: DC
  Administered 2015-12-06 – 2015-12-07 (×2): 40 mg via ORAL
  Filled 2015-12-05 (×2): qty 1

## 2015-12-05 MED ORDER — SODIUM CHLORIDE 0.9 % IV SOLN
INTRAVENOUS | Status: DC
  Administered 2015-12-05 – 2015-12-06 (×2): via INTRAVENOUS

## 2015-12-05 MED ORDER — ASPIRIN EC 81 MG PO TBEC
81.0000 mg | DELAYED_RELEASE_TABLET | Freq: Every day | ORAL | Status: DC
  Administered 2015-12-06 – 2015-12-07 (×2): 81 mg via ORAL
  Filled 2015-12-05 (×2): qty 1

## 2015-12-05 MED ORDER — ONDANSETRON HCL 4 MG/2ML IJ SOLN: 4.0000 mg | Freq: Four times a day (QID) | INTRAMUSCULAR | Status: DC | PRN

## 2015-12-05 MED ORDER — ASPIRIN 81 MG PO TABS: 81.0000 mg | ORAL_TABLET | Freq: Every day | ORAL | Status: DC

## 2015-12-05 MED ORDER — FERROUS GLUCONATE 324 (38 FE) MG PO TABS
324.0000 mg | ORAL_TABLET | Freq: Every day | ORAL | Status: DC
  Filled 2015-12-05: qty 1

## 2015-12-05 MED ORDER — SODIUM CHLORIDE 0.9 % IV SOLN: 250.0000 mL | INTRAVENOUS | Status: DC | PRN

## 2015-12-05 MED ORDER — ADULT MULTIVITAMIN W/MINERALS CH
1.0000 | ORAL_TABLET | Freq: Every day | ORAL | Status: DC
  Administered 2015-12-07: 1 via ORAL
  Filled 2015-12-05: qty 1

## 2015-12-05 MED ORDER — HEPARIN SODIUM (PORCINE) 5000 UNIT/ML IJ SOLN
5000.0000 [IU] | Freq: Three times a day (TID) | INTRAMUSCULAR | Status: DC
  Administered 2015-12-05 – 2015-12-06 (×2): 5000 [IU] via SUBCUTANEOUS
  Filled 2015-12-05 (×2): qty 1

## 2015-12-05 NOTE — Progress Notes (Signed)
Dr. Percival Spanish listed as attending but not on today. Dr. Oval Linsey paged, she is not covering but stated that she would pass on to MD on call today to see pt for preadmit for tomorrow's cardiac cath.

## 2015-12-05 NOTE — H&P (Signed)
Patient ID: Frederick Davis MRN: [id number], DOB/AGE: 03/12/1923   Admit date: 12/05/2015   Primary Physician: Redge Gainer, MD Primary Cardiologist: Dr Percival Spanish  HPI:   The patient was seen by Dr Percival Spanish in the office 11/29/15  for followup after a acute inferior wall infarct in May of 2014. He had thrombectomy of a right coronary artery occlusion. He was initially acute respiratory failure and was cooled. However, he responded very well to therapy. He was in the hospital in January 2017 with chest discomfort. Cardiac enzymes were negative. He was managed medically. He returned 11/29/15 for follow-up. He says he did have chest pain again severely on the morning of January 28. This was at rest. This lasted for a few hours. He did not seek transport to the emergency room. He did take one nitroglycerin and his symptoms resolved spontaneously. He's been able to be out doing some activities such as a little golfing. However, he said he said more fatigue than he did previously. He's not having any PND or orthopnea. He's not having any palpitations, presyncope or syncope.   Problem List: Past Medical History  Diagnosis Date  . Hypothyroidism   . Hypertension   . Rectum cancer ([place]) 1986  . PC (prostate cancer) ([place]) 1987  . Renal cell cancer ([place])   . Osteoporosis   . Esophageal stricture   . Anemia   . CAD (coronary artery disease)     RCA throbectomy 2014  . Chronic renal insufficiency, stage III (moderate)   . Myocardial infarction ([place]) 03/12/13    Inferior MI.  LAD mid 80% stenosis, circ mild plaque, RCA 100% with thrombectomy PTCA.  EF 65%    Past Surgical History  Procedure Laterality Date  . Kidney surgery  2004  . Radiofrequency ablation kidney  2006  . Hernia repair      2007  . Renal sphincter appliance  2007  . Colonoscopy  01/08/2012    Procedure: COLONOSCOPY;  Surgeon: Rogene Houston, MD;  [facility];  Service: Endoscopy;  Laterality: N/A;  1030  .  Colostomy Left 1986    colon cancer  . Percutaneous coronary intervention-balloon only    . Left heart catheterization with coronary angiogram N/A 03/12/2013    Procedure: STEMI;  Surgeon: Burnell Blanks, MD;  Location: [facility] CATH LAB;  Service: Cardiovascular;  Laterality: N/A;  . Prostatectomy    . Right inguinal hernia repair    . Colonoscopy N/A 07/20/2015    Procedure: COLONOSCOPY;  Surgeon: Rogene Houston, MD;  [facility];  Service: Endoscopy;  Laterality: N/A;  930 - moved to 9/29 @ 2:00     Allergies:  Allergies  Allergen Reactions  . Lipitor [Atorvastatin] Other (See Comments)    Muscle aches     Home Medications Prior to Admission medications   Medication Sig Start Date End Date Taking? Authorizing Provider  aspirin 81 MG tablet Take 1 tablet (81 mg total) by mouth daily. 03/18/13  Yes Rhonda G Barrett, PA-C  Bioflavonoid Products (BIOFLEX) TABS Take 1 tablet by mouth daily.   Yes Historical Provider, MD  Cholecalciferol (VITAMIN D3) 2000 UNITS TABS Take 2,000 Units by mouth daily.   Yes Historical Provider, MD  ferrous gluconate (FERGON) 324 MG tablet Take 324 mg by mouth daily with breakfast.   Yes Historical Provider, MD  isosorbide mononitrate (IMDUR) 30 MG 24 hr tablet Take 1 tablet (30 mg total) by mouth daily. 10/18/15  Yes Bhavinkumar  Bhagat, PA  levothyroxine (SYNTHROID, LEVOTHROID) 75 MCG tablet TAKE 1 TABLET DAILY 03/13/15  Yes Chipper Herb, MD  Multiple Vitamin (MULITIVITAMIN WITH MINERALS) TABS Take 1 tablet by mouth daily.   Yes Historical Provider, MD  nitroGLYCERIN (NITROSTAT) 0.4 MG SL tablet PLACE 1 TABLET UNDER THE TONGUE AT ONSET OF CHEST PAIN EVERY 5 MINTUES UP TO 3 TIMES AS NEEDED 02/13/15  Yes Chipper Herb, MD  pantoprazole (PROTONIX) 40 MG tablet Take 1 tablet (40 mg total) by mouth daily. 10/18/15  Yes Leanor Kail, PA     Family History  Problem Relation Age of Onset  . Stroke Mother 80  . AAA (abdominal aortic aneurysm)  Father 22    died from rupture at 27 yo     Social History   Social History  . Marital Status: Married    Spouse Name: Mikle Bosworth  . Number of Children: N/A  . Years of Education: N/A   Occupational History  . pharmacist-retired   . preacher-retired    Social History Main Topics  . Smoking status: Former Smoker -- 0.50 packs/day    Types: Cigarettes    Start date: 10/21/1946    Quit date: 10/21/1968  . Smokeless tobacco: Not on file  . Alcohol Use: 0.5 oz/week    1 Standard drinks or equivalent per week     Comment: rare, wine  . Drug Use: No  . Sexual Activity: Not on file   Other Topics Concern  . Not on file   Social History Narrative     Review of Systems: General: negative for chills, fever, night sweats or weight changes.  Cardiovascular: negative for chest pain, dyspnea on exertion, edema, orthopnea, palpitations, paroxysmal nocturnal dyspnea or shortness of breath HEENT: negative for any visual disturbances, blindness, glaucoma Dermatological: negative for rash Respiratory: negative for cough, hemoptysis, or wheezing Urologic: negative for hematuria or dysuria Abdominal: negative for nausea, vomiting, diarrhea, bright red blood per rectum, melena, or hematemesis Neurologic: negative for visual changes, syncope, or dizziness Musculoskeletal: negative for back pain, joint pain, or swelling Psych: cooperative and appropriate All other systems reviewed and are otherwise negative except as noted above.  Physical Exam: Blood pressure 156/89, pulse 69, temperature 97.7 F (36.5 C), temperature source Oral, resp. rate 18, SpO2 97 %.  General appearance: alert, cooperative and no distress Neck: no carotid bruit and no JVD Lungs: clear to auscultation bilaterally Heart: regular rate and rhythm and 2/6 systolic murmur AOV, LSB Abdomen: soft, non-tender; bowel sounds normal; no masses,  no organomegaly Extremities: extremities normal, atraumatic, no cyanosis or  edema Pulses: 2+ and symmetric Skin: Skin color, texture, turgor normal. No rashes or lesions Neurologic: Grossly normal    Labs:  No results found for this or any previous visit (from the past 24 hour(s)).   Radiology/Studies: Dg Chest 2 View  11/07/2015  CLINICAL DATA:  Cough and congestion EXAM: CHEST  2 VIEW COMPARISON:  October 17, 2015 FINDINGS: The heart size and mediastinal contours are stable. The heart size is enlarged. There is a large hiatal hernia. There is no focal infiltrate, pulmonary edema, or pleural effusion. There is minimal atelectasis of lateral left lung base. The visualized skeletal structures are stable. IMPRESSION: Cardiomegaly unchanged.  No focal pneumonia or pulmonary edema. Electronically Signed   By: Abelardo Diesel [name]   On: 11/07/2015 15:03     ASSESSMENT AND PLAN:  Chest pain with a high risk for acute coronary syndrome  Dr Percival Spanish reviewed his films. He's  had another episode of significant chest discomfort at rest. This clearly could be related to the severe LAD stenosis that was seen in 2014. I think cardiac catheterization is indicated. The patient understands that risks included but are not limited to stroke (1 in 1000), death (1 in 62), kidney failure [usually temporary] (1 in 500), bleeding (1 in 200), allergic reaction [possibly serious] (1 in 200). The patient understands and agrees to proceed. He does have a mildly elevated creatinine and a single kidney. I would like to admit him for hydration the night before the procedure.  CAD As above.  AS This was mild to moderate on echo in 2014. I like to repeat this on the day of admission prior to his catheterization. If there's been any significant progression with consider right heart catheterization.  HTN Stable and well controlled. Continue current meds.  CDK, stage III This will be followed closely.  Anemia He has had chronic anemia with no recent history of active bleeding.  PLAN:   Admit for hydration and cath tomorrow.    SignedErlene Quan, PA-C 12/05/2015, 5:10 PM [phone]  I have seen and examined the patient along with [facility] K, PA-C.  I have reviewed the chart, notes and new data.  I agree with PA's note.  Key new complaints: asymptomatic at this time Key examination changes: 2-3/6 aortic ejection murmur is early peaking Key new findings / data: creat 1.44 on 11/07/15  PLAN: Overnight hydration due to surgical single kidney prior to elective cardiac cath and possible PCI in AM. This procedure has been fully reviewed with the patient and written informed consent has been obtained.   Sanda Klein, MD, [place] [phone] 12/05/2015, 5:38 PM

## 2015-12-06 ENCOUNTER — Other Ambulatory Visit: Payer: Self-pay | Admitting: *Deleted

## 2015-12-06 ENCOUNTER — Encounter (HOSPITAL_COMMUNITY): Payer: Self-pay | Admitting: General Practice

## 2015-12-06 ENCOUNTER — Ambulatory Visit (HOSPITAL_COMMUNITY): Payer: Medicare Other

## 2015-12-06 ENCOUNTER — Encounter (HOSPITAL_COMMUNITY)
Admission: RE | Disposition: A | Discharge status: Home Health | Payer: Self-pay | Source: Ambulatory Visit | Attending: Thoracic Surgery (Cardiothoracic Vascular Surgery)

## 2015-12-06 DIAGNOSIS — I350 Nonrheumatic aortic (valve) stenosis: Secondary | ICD-10-CM

## 2015-12-06 DIAGNOSIS — I200 Unstable angina: Secondary | ICD-10-CM | POA: Insufficient documentation

## 2015-12-06 DIAGNOSIS — I2510 Atherosclerotic heart disease of native coronary artery without angina pectoris: Secondary | ICD-10-CM

## 2015-12-06 DIAGNOSIS — I25110 Atherosclerotic heart disease of native coronary artery with unstable angina pectoris: Secondary | ICD-10-CM

## 2015-12-06 HISTORY — PX: CARDIAC CATHETERIZATION: SHX172

## 2015-12-06 LAB — CBC WITH DIFFERENTIAL/PLATELET
Basophils Absolute: 0 10*3/uL (ref 0.0–0.1)
Basophils Relative: 1 %
Eosinophils Absolute: 0.5 10*3/uL (ref 0.0–0.7)
Eosinophils Relative: 7 %
HCT: 34.2 % — ABNORMAL LOW (ref 39.0–52.0)
Hemoglobin: 10.7 g/dL — ABNORMAL LOW (ref 13.0–17.0)
Lymphocytes Relative: 22 %
Lymphs Abs: 1.4 10*3/uL (ref 0.7–4.0)
MCH: 27.2 pg (ref 26.0–34.0)
MCHC: 31.3 g/dL (ref 30.0–36.0)
MCV: 86.8 fL (ref 78.0–100.0)
Monocytes Absolute: 0.5 10*3/uL (ref 0.1–1.0)
Monocytes Relative: 7 %
Neutro Abs: 4.1 10*3/uL (ref 1.7–7.7)
Neutrophils Relative %: 63 %
Platelets: 177 10*3/uL (ref 150–400)
RBC: 3.94 MIL/uL — ABNORMAL LOW (ref 4.22–5.81)
RDW: 14.3 % (ref 11.5–15.5)
WBC: 6.4 10*3/uL (ref 4.0–10.5)

## 2015-12-06 LAB — POCT ACTIVATED CLOTTING TIME: Activated Clotting Time: 178 seconds

## 2015-12-06 LAB — BASIC METABOLIC PANEL
Anion gap: 9 (ref 5–15)
BUN: 29 mg/dL — ABNORMAL HIGH (ref 6–20)
CO2: 21 mmol/L — ABNORMAL LOW (ref 22–32)
Calcium: 9.2 mg/dL (ref 8.9–10.3)
Chloride: 109 mmol/L (ref 101–111)
Creatinine, Ser: 1.49 mg/dL — ABNORMAL HIGH (ref 0.61–1.24)
GFR calc Af Amer: 45 mL/min — ABNORMAL LOW (ref >60)
GFR calc non Af Amer: 39 mL/min — ABNORMAL LOW (ref >60)
Glucose, Bld: 86 mg/dL (ref 65–99)
Potassium: 4.2 mmol/L (ref 3.5–5.1)
Sodium: 139 mmol/L (ref 135–145)

## 2015-12-06 LAB — PROTIME-INR
INR: 1.12 (ref 0.00–1.49)
Prothrombin Time: 14.6 seconds (ref 11.6–15.2)

## 2015-12-06 SURGERY — LEFT HEART CATH AND CORONARY ANGIOGRAPHY
Anesthesia: LOCAL

## 2015-12-06 MED ORDER — HEPARIN (PORCINE) IN NACL 2-0.9 UNIT/ML-% IJ SOLN
INTRAMUSCULAR | Status: DC | PRN
  Administered 2015-12-06: 12:00:00 via INTRA_ARTERIAL

## 2015-12-06 MED ORDER — HEPARIN SODIUM (PORCINE) 1000 UNIT/ML IJ SOLN
INTRAMUSCULAR | Status: AC
  Filled 2015-12-06: qty 1

## 2015-12-06 MED ORDER — VERAPAMIL HCL 2.5 MG/ML IV SOLN
INTRAVENOUS | Status: AC
  Filled 2015-12-06: qty 2

## 2015-12-06 MED ORDER — FENTANYL CITRATE (PF) 100 MCG/2ML IJ SOLN
INTRAMUSCULAR | Status: DC | PRN
  Administered 2015-12-06: 25 ug via INTRAVENOUS

## 2015-12-06 MED ORDER — ACETAMINOPHEN 325 MG PO TABS: 650.0000 mg | ORAL_TABLET | ORAL | Status: DC | PRN

## 2015-12-06 MED ORDER — HEPARIN (PORCINE) IN NACL 2-0.9 UNIT/ML-% IJ SOLN
INTRAMUSCULAR | Status: DC | PRN
  Administered 2015-12-06: 1000 mL

## 2015-12-06 MED ORDER — HEPARIN SODIUM (PORCINE) 1000 UNIT/ML IJ SOLN
INTRAMUSCULAR | Status: DC | PRN
Start: 2015-12-06 — End: 2015-12-06
  Administered 2015-12-06: 4000 [IU] via INTRAVENOUS

## 2015-12-06 MED ORDER — ONDANSETRON HCL 4 MG/2ML IJ SOLN: 4.0000 mg | Freq: Four times a day (QID) | INTRAMUSCULAR | Status: DC | PRN

## 2015-12-06 MED ORDER — IOHEXOL 350 MG/ML SOLN
INTRAVENOUS | Status: DC | PRN
Start: 2015-12-06 — End: 2015-12-06
  Administered 2015-12-06: 60 mL via INTRA_ARTERIAL

## 2015-12-06 MED ORDER — SODIUM CHLORIDE 0.9 % IV SOLN: INTRAVENOUS | Status: AC

## 2015-12-06 MED ORDER — LIDOCAINE HCL (PF) 1 % IJ SOLN
INTRAMUSCULAR | Status: AC
  Filled 2015-12-06: qty 30

## 2015-12-06 MED ORDER — ALUM & MAG HYDROXIDE-SIMETH 200-200-20 MG/5ML PO SUSP
15.0000 mL | ORAL | Status: DC | PRN
  Administered 2015-12-06: 15 mL via ORAL
  Filled 2015-12-06: qty 30

## 2015-12-06 MED ORDER — SODIUM CHLORIDE 0.9% FLUSH
3.0000 mL | Freq: Two times a day (BID) | INTRAVENOUS | Status: DC
  Administered 2015-12-07 (×2): 3 mL via INTRAVENOUS

## 2015-12-06 MED ORDER — HEPARIN (PORCINE) IN NACL 2-0.9 UNIT/ML-% IJ SOLN
INTRAMUSCULAR | Status: AC
  Filled 2015-12-06: qty 1000

## 2015-12-06 MED ORDER — SODIUM CHLORIDE 0.9% FLUSH: 3.0000 mL | INTRAVENOUS | Status: DC | PRN

## 2015-12-06 MED ORDER — SODIUM CHLORIDE 0.9 % IV SOLN
250.0000 mL | INTRAVENOUS | Status: DC | PRN
Start: 2015-12-06 — End: 2015-12-08
  Administered 2015-12-08: 13:00:00 via INTRAVENOUS

## 2015-12-06 MED ORDER — MIDAZOLAM HCL 2 MG/2ML IJ SOLN
INTRAMUSCULAR | Status: AC
  Filled 2015-12-06: qty 2

## 2015-12-06 MED ORDER — LIDOCAINE HCL (PF) 1 % IJ SOLN
INTRAMUSCULAR | Status: DC | PRN
  Administered 2015-12-06: 20 mL via SUBCUTANEOUS

## 2015-12-06 MED ORDER — MIDAZOLAM HCL 2 MG/2ML IJ SOLN
INTRAMUSCULAR | Status: DC | PRN
  Administered 2015-12-06: 1 mg via INTRAVENOUS

## 2015-12-06 MED ORDER — FENTANYL CITRATE (PF) 100 MCG/2ML IJ SOLN
INTRAMUSCULAR | Status: AC
  Filled 2015-12-06: qty 2

## 2015-12-06 SURGICAL SUPPLY — 16 items
CATH INFINITI 5FR ANG PIGTAIL (CATHETERS) ×2 IMPLANT
CATH INFINITI 5FR JL4 (CATHETERS) ×1 IMPLANT
CATH INFINITI 5FR JL5 (CATHETERS) ×1 IMPLANT
CATH INFINITI 5FR MULTPACK ANG (CATHETERS) IMPLANT
CATH INFINITI JR4 5F (CATHETERS) ×2 IMPLANT
DEVICE RAD COMP TR BAND LRG (VASCULAR PRODUCTS) ×2 IMPLANT
GLIDESHEATH SLEND SS 6F .021 (SHEATH) ×2 IMPLANT
KIT HEART LEFT (KITS) ×2 IMPLANT
PACK CARDIAC CATHETERIZATION (CUSTOM PROCEDURE TRAY) ×2 IMPLANT
SHEATH PINNACLE 5F 10CM (SHEATH) ×1 IMPLANT
SYR MEDRAD MARK V 150ML (SYRINGE) ×2 IMPLANT
TRANSDUCER W/STOPCOCK (MISCELLANEOUS) ×2 IMPLANT
TUBING CIL FLEX 10 FLL-RA (TUBING) ×2 IMPLANT
WIRE EMERALD 3MM-J .035X150CM (WIRE) ×1 IMPLANT
WIRE HI TORQ VERSACORE-J 145CM (WIRE) ×1 IMPLANT
WIRE SAFE-T 1.5MM-J .035X260CM (WIRE) ×2 IMPLANT

## 2015-12-06 NOTE — Progress Notes (Signed)
Site area: right groin  A 5 french arterial sheath was removed Site Prior to Removal:  Level 0  Pressure Applied For 20 MINUTES    Minutes Beginning at 1255p  Manual:   Yes.    Patient Status During Pull:  stable  Post Pull Groin Site:  Level 0  Post Pull Instructions Given:  Yes.    Post Pull Pulses Present:  Yes.    Dressing Applied:  Yes.    Comments:  VS remain stable during sheath pull

## 2015-12-06 NOTE — Consult Note (Signed)
Reason for Consult:severe 2 vessel CAD with unstable angina Referring Physician: Dr. Angelena Form Hochrein  Frederick Davis is an 82 y.o. male.  HPI: Frederick Davis is a 82 yo man who presents with a cc/o CP.  He has an extensive medical history dating back many years. He has had rectal cancer and has a colostomy, prostate cancer, and renal cell cancer. He has a single kidney with stage III CKD. He had an MI in May 2014 and underwent a RCA thrombectomy. The MI was complicated by respiratory failure and he underwent systemic cooling. He had residual 80% mid LAD stenosis.   He has been having some chest pain off and on since his MI. Recently it has gotten worse. He was hospitalized in January of this year with CP. He ruled out for MI and was treated medically. He had a severe episode of CP in the early morning hours on Jan 28th. It awakened him from sleep. He finally took a NTG and the pain eased off. He did not seek further medical attention. He saw Dr. Percival Spanish on the 11/29/15. After hearing of this episode he recommended cardiac catheterization.   At cath today he had severe 2 vessel CAD involving the LAD, a diagonal branch and the RCA. The circumflex had some mild plaque but no flow limiting stenosis. EF 50-55% by echo. Echo also shows mild AS.  He lives with his wife. He remains active playing golf about 3 times a week. He has to take a cart because his left knee "gives out on me." He and his wife take care of all of their own needs.    Past Medical History  Diagnosis Date  . Hypothyroidism   . Hypertension   . Rectum cancer ([place]) 1986  . PC (prostate cancer) ([place]) 1987  . Renal cell cancer ([place])   . Osteoporosis   . Esophageal stricture   . Anemia   . CAD (coronary artery disease)     RCA throbectomy 2014  . Chronic renal insufficiency, stage III (moderate)   . Myocardial infarction ([place]) 03/12/13    Inferior MI.  LAD mid 80% stenosis, circ mild plaque, RCA 100% with thrombectomy PTCA.  EF  65%    Past Surgical History  Procedure Laterality Date  . Kidney surgery  2004  . Radiofrequency ablation kidney  2006  . Hernia repair      2007  . Renal sphincter appliance  2007  . Colonoscopy  01/08/2012    Procedure: COLONOSCOPY;  Surgeon: Rogene Houston, MD;  Location: [facility];  Service: Endoscopy;  Laterality: N/A;  1030  . Colostomy Left 1986    colon cancer  . Percutaneous coronary intervention-balloon only    . Left heart catheterization with coronary angiogram N/A 03/12/2013    Procedure: STEMI;  Surgeon: Burnell Blanks, MD;  Location: [facility] CATH LAB;  Service: Cardiovascular;  Laterality: N/A;  . Prostatectomy    . Right inguinal hernia repair    . Colonoscopy N/A 07/20/2015    Procedure: COLONOSCOPY;  Surgeon: Rogene Houston, MD;  Location: [facility];  Service: Endoscopy;  Laterality: N/A;  930 - moved to 9/29 @ 2:00  . Cardiac catheterization N/A 12/06/2015    Procedure: Left Heart Cath and Coronary Angiography;  Surgeon: Burnell Blanks, MD;  Location: [facility] CV LAB;  Service: Cardiovascular;  Laterality: N/A;    Family History  Problem Relation Age of Onset  . Stroke Mother 77  . AAA (abdominal aortic  aneurysm) Father 32    died from rupture at 55 yo    Social History:  reports that he quit smoking about 47 years ago. His smoking use included Cigarettes. He started smoking about 69 years ago. He smoked 0.50 packs per day. He has never used smokeless tobacco. He reports that he drinks about 0.5 oz of alcohol per week. He reports that he does not use illicit drugs.  Allergies:  Allergies  Allergen Reactions  . Lipitor [Atorvastatin] Other (See Comments)    Muscle aches    Medications:  Prior to Admission:  Prescriptions prior to admission  Medication Sig Dispense Refill Last Dose  . aspirin 81 MG tablet Take 1 tablet (81 mg total) by mouth daily. 30 tablet  Taking  . Bioflavonoid Products (BIOFLEX) TABS Take 1 tablet by mouth  daily.   Taking  . Cholecalciferol (VITAMIN D3) 2000 UNITS TABS Take 2,000 Units by mouth daily.   Taking  . ferrous gluconate (FERGON) 324 MG tablet Take 324 mg by mouth daily with breakfast.   Taking  . isosorbide mononitrate (IMDUR) 30 MG 24 hr tablet Take 1 tablet (30 mg total) by mouth daily. 30 tablet 11 Taking  . levothyroxine (SYNTHROID, LEVOTHROID) 75 MCG tablet TAKE 1 TABLET DAILY 90 tablet 2 Taking  . Multiple Vitamin (MULITIVITAMIN WITH MINERALS) TABS Take 1 tablet by mouth daily.   Taking  . nitroGLYCERIN (NITROSTAT) 0.4 MG SL tablet PLACE 1 TABLET UNDER THE TONGUE AT ONSET OF CHEST PAIN EVERY 5 MINTUES UP TO 3 TIMES AS NEEDED 25 tablet 11 Taking  . pantoprazole (PROTONIX) 40 MG tablet Take 1 tablet (40 mg total) by mouth daily. 30 tablet 11 Taking    Results for orders placed or performed during the hospital encounter of 12/05/15 (from the past 48 hour(s))  CBC     Status: Abnormal   Collection Time: 12/05/15  6:00 PM  Result Value Ref Range   WBC 8.8 4.0 - 10.5 K/uL   RBC 4.22 4.22 - 5.81 MIL/uL   Hemoglobin 11.9 (L) 13.0 - 17.0 g/dL   HCT 37.0 (L) 39.0 - 52.0 %   MCV 87.7 78.0 - 100.0 fL   MCH 28.2 26.0 - 34.0 pg   MCHC 32.2 30.0 - 36.0 g/dL   RDW 14.5 11.5 - 15.5 %   Platelets 204 150 - 400 K/uL  Creatinine, serum     Status: Abnormal   Collection Time: 12/05/15  6:00 PM  Result Value Ref Range   Creatinine, Ser 1.66 (H) 0.61 - 1.24 mg/dL   GFR calc non Af Amer 34 (L) >60 mL/min   GFR calc Af Amer 40 (L) >60 mL/min    Comment: (NOTE) The eGFR has been calculated using the CKD EPI equation. This calculation has not been validated in all clinical situations. eGFR's persistently <60 mL/min signify possible Chronic Kidney Disease.   Basic metabolic panel     Status: Abnormal   Collection Time: 12/06/15  2:30 AM  Result Value Ref Range   Sodium 139 135 - 145 mmol/L   Potassium 4.2 3.5 - 5.1 mmol/L   Chloride 109 101 - 111 mmol/L   CO2 21 (L) 22 - 32 mmol/L    Glucose, Bld 86 65 - 99 mg/dL   BUN 29 (H) 6 - 20 mg/dL   Creatinine, Ser 1.49 (H) 0.61 - 1.24 mg/dL   Calcium 9.2 8.9 - 10.3 mg/dL   GFR calc non Af Amer 39 (L) >60 mL/min   GFR  calc Af Amer 45 (L) >60 mL/min    Comment: (NOTE) The eGFR has been calculated using the CKD EPI equation. This calculation has not been validated in all clinical situations. eGFR's persistently <60 mL/min signify possible Chronic Kidney Disease.    Anion gap 9 5 - 15  Protime-INR     Status: None   Collection Time: 12/06/15  2:30 AM  Result Value Ref Range   Prothrombin Time 14.6 11.6 - 15.2 seconds   INR 1.12 0.00 - 1.49  CBC WITH DIFFERENTIAL     Status: Abnormal   Collection Time: 12/06/15  2:30 AM  Result Value Ref Range   WBC 6.4 4.0 - 10.5 K/uL   RBC 3.94 (L) 4.22 - 5.81 MIL/uL   Hemoglobin 10.7 (L) 13.0 - 17.0 g/dL   HCT 34.2 (L) 39.0 - 52.0 %   MCV 86.8 78.0 - 100.0 fL   MCH 27.2 26.0 - 34.0 pg   MCHC 31.3 30.0 - 36.0 g/dL   RDW 14.3 11.5 - 15.5 %   Platelets 177 150 - 400 K/uL   Neutrophils Relative % 63 %   Neutro Abs 4.1 1.7 - 7.7 K/uL   Lymphocytes Relative 22 %   Lymphs Abs 1.4 0.7 - 4.0 K/uL   Monocytes Relative 7 %   Monocytes Absolute 0.5 0.1 - 1.0 K/uL   Eosinophils Relative 7 %   Eosinophils Absolute 0.5 0.0 - 0.7 K/uL   Basophils Relative 1 %   Basophils Absolute 0.0 0.0 - 0.1 K/uL  POCT Activated clotting time     Status: None   Collection Time: 12/06/15 12:15 PM  Result Value Ref Range   Activated Clotting Time 178 seconds    No results found.  Review of Systems  Constitutional: Positive for malaise/fatigue. Negative for fever and chills.  Respiratory: Positive for shortness of breath. Negative for wheezing.   Cardiovascular: Positive for chest pain. Negative for orthopnea, claudication and leg swelling.  Gastrointestinal: Negative for nausea, vomiting and blood in stool.       Colostomy  Genitourinary: Positive for frequency. Negative for dysuria and hematuria.   Neurological: Negative for dizziness, speech change, focal weakness, loss of consciousness and weakness.  All other systems reviewed and are negative.  Blood pressure 152/98, pulse 78, temperature 98.1 F (36.7 C), temperature source Oral, resp. rate 9, weight 170 lb 3.1 oz (77.2 kg), SpO2 98 %. Physical Exam  Vitals reviewed. Constitutional: He is oriented to person, place, and time. He appears well-developed and well-nourished. No distress.  HENT:  Head: Normocephalic and atraumatic.  Eyes: Conjunctivae and EOM are normal. No scleral icterus.  Neck: Neck supple. No thyromegaly present.  No bruits  Cardiovascular: Normal rate, regular rhythm and intact distal pulses.   Murmur (2/6 systolic) heard. Respiratory: Effort normal and breath sounds normal. He has no wheezes. He has no rales.  GI: Soft. He exhibits no distension. There is no tenderness.  Ostomy bag in place  Musculoskeletal: He exhibits no edema.  Lymphadenopathy:    He has no cervical adenopathy.  Neurological: He is alert and oriented to person, place, and time. No cranial nerve deficit.  No focal motor deficit  Skin: Skin is warm and dry.   ECHOCARDIOGRAM Study Conclusions  - Left ventricle: EF hard to judge due to freqent ventricular ectopy. Wall thickness was increased in a pattern of mild LVH. Systolic function was normal. The estimated ejection fraction was in the range of 50% to 55%. - Aortic valve: There was  mild stenosis. Valve area (VTI): 2.01 cm^2. Valve area (Vmax): 1.77 cm^2. Valve area (Vmean): 1.57 cm^2. - Mitral valve: Calcified annulus. There was mild regurgitation. - Left atrium: The atrium was mildly dilated. - Right atrium: The atrium was mildly dilated. - Atrial septum: No defect or patent foramen ovale was identified.  CARDIAC CATHETERIZATION Conclusion    1. Severe triple vessel CAD 2. The entire proximal and mid LAD is severely calcified. The proximal LAD has a 95% stenosis  just before the takeoff of the large caliber diagonal branch. The Diagonal branch has proximal serial 99% stenoses.  3. The Circumflex has mild non-obstructive disease.  4. The RCA is a large dominant vessel with heavy calcification in the proximal and mid vessel. The proximal vessel has a discreet 95% stenosis.   Recommendations: He is a very functional 82 yo male. He is active and plays golf, shops and drives. He has severe three vessel CAD with heavy calcification in all vessels making PCI high risk. Echo is pending today but he is known to have normal LV systolic function. I will ask CT surgery to see him today to discuss CABG. Despite his advanced age I think he could be a CABG candidate. If he is not felt to be a candidate for CABG, will have to plan complex PCI of the LAD and RCA with rotablator atherectomy. I would also attempt PCI of the Diagonal branch. Continue ASA, Imdur. Consider beta blocker.    I personally reviewed the echo and cath films and concur with the findings as noted above  Assessment/Plan: 82 yo man who presents with unstable angina and has been found to have severe 2 vessel CAD with preserved LV function. His anatomy is not favorable for PCI due to heavily calcified vessels. He has good targets for bypass grafting.  He is a high risk candidate for CABG given advanced age and renal issues, but his risk is not prohibitive.  I have discussed the possibility of CABG with Frederick and Mrs Woolbright. I explained the general nature of the procedure, the need for general anesthesia, the use of cardiopulmonary bypass, and the incisions to be used. We discussed the expected hospital stay, overall recovery and short and long term outcomes. I reviewed the indications, risks, benefits and alternatives. They understand the risks include, but are not limited to death, stroke, MI, DVT/PE, bleeding, possible need for transfusion, infections, cardiac arrhythmias, and other organ system dysfunction  including respiratory, renal, or GI complications. He is at high risk for renal failure due to pre-existing condition and for stroke given advance age.  He accepts the risks and agrees to proceed  He needs carotid duplex.  Tentatively plan CABG Friday 2/17.  Melrose Nakayama 12/06/2015, 5:09 PM

## 2015-12-06 NOTE — Interval H&P Note (Signed)
History and Physical Interval Note:  12/06/2015 11:24 AM  Frederick Davis  has presented today for cardiac cath with the diagnosis of CAD, unstable angina.  The various methods of treatment have been discussed with the patient and family. After consideration of risks, benefits and other options for treatment, the patient has consented to  Procedure(s): Left Heart Cath and Coronary Angiography (N/A) as a surgical intervention .  The patient's history has been reviewed, patient examined, no change in status, stable for surgery.  I have reviewed the patient's chart and labs.  Questions were answered to the patient's satisfaction.    Cath Lab Visit (complete for each Cath Lab visit)  Clinical Evaluation Leading to the Procedure:   ACS: No.  Non-ACS:    Anginal Classification: CCS III  Anti-ischemic medical therapy: Minimal Therapy (1 class of medications)  Non-Invasive Test Results: No non-invasive testing performed  Prior CABG: No previous CABG         Frederick Davis

## 2015-12-06 NOTE — Progress Notes (Signed)
Utilization review completed.  

## 2015-12-06 NOTE — Progress Notes (Signed)
  Echocardiogram 2D Echocardiogram has been performed.  Jennette Dubin 12/06/2015, 10:56 AM

## 2015-12-07 ENCOUNTER — Inpatient Hospital Stay (HOSPITAL_COMMUNITY): Payer: Medicare Other

## 2015-12-07 ENCOUNTER — Ambulatory Visit (HOSPITAL_COMMUNITY): Payer: Medicare Other

## 2015-12-07 DIAGNOSIS — I2510 Atherosclerotic heart disease of native coronary artery without angina pectoris: Secondary | ICD-10-CM

## 2015-12-07 LAB — PULMONARY FUNCTION TEST
DL/VA % pred: 79 %
DL/VA: 3.29 ml/min/mmHg/L
DLCO COR: 17.95 ml/min/mmHg
DLCO UNC % PRED: 65 %
DLCO UNC: 15.82 ml/min/mmHg
DLCO cor % pred: 74 %
FEF 25-75 PRE: 2.14 L/s
FEF 25-75 Post: 2.26 L/sec
FEF2575-%Change-Post: 6 %
FEF2575-%PRED-PRE: 242 %
FEF2575-%Pred-Post: 257 %
FEV1-%Change-Post: 1 %
FEV1-%PRED-POST: 161 %
FEV1-%PRED-PRE: 159 %
FEV1-POST: 2.68 L
FEV1-PRE: 2.65 L
FEV1FVC-%Change-Post: 5 %
FEV1FVC-%Pred-Pre: 108 %
FEV6-%CHANGE-POST: -2 %
FEV6-%PRED-POST: 147 %
FEV6-%PRED-PRE: 151 %
FEV6-POST: 3.4 L
FEV6-Pre: 3.49 L
FEV6FVC-%Change-Post: 1 %
FEV6FVC-%PRED-POST: 111 %
FEV6FVC-%Pred-Pre: 109 %
FVC-%Change-Post: -3 %
FVC-%Pred-Post: 132 %
FVC-%Pred-Pre: 137 %
FVC-POST: 3.4 L
FVC-Pre: 3.53 L
POST FEV6/FVC RATIO: 100 %
PRE FEV1/FVC RATIO: 75 %
Post FEV1/FVC ratio: 79 %
Pre FEV6/FVC Ratio: 99 %
RV % PRED: 85 %
RV: 2.19 L
TLC % pred: 101 %
TLC: 5.98 L

## 2015-12-07 LAB — URINALYSIS, ROUTINE W REFLEX MICROSCOPIC
Bilirubin Urine: NEGATIVE
GLUCOSE, UA: NEGATIVE mg/dL
Ketones, ur: NEGATIVE mg/dL
Nitrite: POSITIVE — AB
PROTEIN: NEGATIVE mg/dL
SPECIFIC GRAVITY, URINE: 1.021 (ref 1.005–1.030)
pH: 5.5 (ref 5.0–8.0)

## 2015-12-07 LAB — COMPREHENSIVE METABOLIC PANEL
ALK PHOS: 67 U/L (ref 38–126)
ALT: 14 U/L — AB (ref 17–63)
AST: 20 U/L (ref 15–41)
Albumin: 3.3 g/dL — ABNORMAL LOW (ref 3.5–5.0)
Anion gap: 10 (ref 5–15)
BUN: 24 mg/dL — ABNORMAL HIGH (ref 6–20)
CALCIUM: 9.7 mg/dL (ref 8.9–10.3)
CO2: 23 mmol/L (ref 22–32)
CREATININE: 1.57 mg/dL — AB (ref 0.61–1.24)
Chloride: 108 mmol/L (ref 101–111)
GFR, EST AFRICAN AMERICAN: 43 mL/min — AB (ref >60)
GFR, EST NON AFRICAN AMERICAN: 37 mL/min — AB (ref >60)
Glucose, Bld: 95 mg/dL (ref 65–99)
Potassium: 4.5 mmol/L (ref 3.5–5.1)
Sodium: 141 mmol/L (ref 135–145)
Total Bilirubin: 0.7 mg/dL (ref 0.3–1.2)
Total Protein: 5.7 g/dL — ABNORMAL LOW (ref 6.5–8.1)

## 2015-12-07 LAB — PROTIME-INR
INR: 1.08 (ref 0.00–1.49)
PROTHROMBIN TIME: 14.2 s (ref 11.6–15.2)

## 2015-12-07 LAB — BLOOD GAS, ARTERIAL
ACID-BASE DEFICIT: 2.2 mmol/L — AB (ref 0.0–2.0)
Bicarbonate: 22 mEq/L (ref 20.0–24.0)
DRAWN BY: 441351
FIO2: 0.21
O2 SAT: 94.9 %
PATIENT TEMPERATURE: 98.6
TCO2: 23.1 mmol/L (ref 0–100)
pCO2 arterial: 37.3 mmHg (ref 35.0–45.0)
pH, Arterial: 7.388 (ref 7.350–7.450)
pO2, Arterial: 74.9 mmHg — ABNORMAL LOW (ref 80.0–100.0)

## 2015-12-07 LAB — CBC
HEMATOCRIT: 35 % — AB (ref 39.0–52.0)
HEMOGLOBIN: 11 g/dL — AB (ref 13.0–17.0)
MCH: 27 pg (ref 26.0–34.0)
MCHC: 31.4 g/dL (ref 30.0–36.0)
MCV: 85.8 fL (ref 78.0–100.0)
Platelets: 194 10*3/uL (ref 150–400)
RBC: 4.08 MIL/uL — AB (ref 4.22–5.81)
RDW: 14.2 % (ref 11.5–15.5)
WBC: 8.2 10*3/uL (ref 4.0–10.5)

## 2015-12-07 LAB — APTT: aPTT: 29 seconds (ref 24–37)

## 2015-12-07 LAB — URINE MICROSCOPIC-ADD ON

## 2015-12-07 LAB — MRSA PCR SCREENING: MRSA BY PCR: NEGATIVE

## 2015-12-07 LAB — ABO/RH: ABO/RH(D): O POS

## 2015-12-07 LAB — POCT ACTIVATED CLOTTING TIME: Activated Clotting Time: 167 seconds

## 2015-12-07 MED ORDER — PLASMA-LYTE 148 IV SOLN
INTRAVENOUS | Status: DC
  Filled 2015-12-07: qty 2.5

## 2015-12-07 MED ORDER — POTASSIUM CHLORIDE 2 MEQ/ML IV SOLN
80.0000 meq | INTRAVENOUS | Status: DC
  Filled 2015-12-07: qty 40

## 2015-12-07 MED ORDER — ALBUTEROL SULFATE (2.5 MG/3ML) 0.083% IN NEBU
2.5000 mg | INHALATION_SOLUTION | Freq: Once | RESPIRATORY_TRACT | Status: AC
  Administered 2015-12-07: 2.5 mg via RESPIRATORY_TRACT

## 2015-12-07 MED ORDER — DOPAMINE-DEXTROSE 3.2-5 MG/ML-% IV SOLN
0.0000 ug/kg/min | INTRAVENOUS | Status: DC
  Filled 2015-12-07: qty 250

## 2015-12-07 MED ORDER — CHLORHEXIDINE GLUCONATE 4 % EX LIQD
60.0000 mL | Freq: Once | CUTANEOUS | Status: AC
  Administered 2015-12-07: 4 via TOPICAL
  Filled 2015-12-07: qty 60

## 2015-12-07 MED ORDER — DEXTROSE 5 % IV SOLN
750.0000 mg | INTRAVENOUS | Status: DC
  Filled 2015-12-07: qty 750

## 2015-12-07 MED ORDER — METOPROLOL TARTRATE 12.5 MG HALF TABLET
12.5000 mg | ORAL_TABLET | Freq: Once | ORAL | Status: AC
  Administered 2015-12-08: 12.5 mg via ORAL
  Filled 2015-12-07: qty 1

## 2015-12-07 MED ORDER — VANCOMYCIN HCL 10 G IV SOLR
1250.0000 mg | INTRAVENOUS | Status: AC
  Administered 2015-12-08: 1250 mg via INTRAVENOUS
  Filled 2015-12-07: qty 1250

## 2015-12-07 MED ORDER — BISACODYL 5 MG PO TBEC: 5.0000 mg | DELAYED_RELEASE_TABLET | Freq: Once | ORAL | Status: DC

## 2015-12-07 MED ORDER — EPINEPHRINE HCL 1 MG/ML IJ SOLN
0.0000 ug/min | INTRAVENOUS | Status: DC
  Filled 2015-12-07: qty 4

## 2015-12-07 MED ORDER — MAGNESIUM SULFATE 50 % IJ SOLN
40.0000 meq | INTRAMUSCULAR | Status: DC
  Filled 2015-12-07: qty 10

## 2015-12-07 MED ORDER — SODIUM CHLORIDE 0.9 % IV SOLN
INTRAVENOUS | Status: AC
  Administered 2015-12-08: 1 [IU]/h via INTRAVENOUS
  Filled 2015-12-07: qty 2.5

## 2015-12-07 MED ORDER — DIAZEPAM 2 MG PO TABS
2.0000 mg | ORAL_TABLET | Freq: Once | ORAL | Status: AC
  Administered 2015-12-08: 2 mg via ORAL
  Filled 2015-12-07: qty 1

## 2015-12-07 MED ORDER — SODIUM CHLORIDE 0.9 % IV SOLN
INTRAVENOUS | Status: DC
  Filled 2015-12-07: qty 30

## 2015-12-07 MED ORDER — SODIUM CHLORIDE 0.9 % IV SOLN
INTRAVENOUS | Status: AC
  Administered 2015-12-08: 69.8 mL/h via INTRAVENOUS
  Filled 2015-12-07: qty 40

## 2015-12-07 MED ORDER — CHLORHEXIDINE GLUCONATE 0.12 % MT SOLN: 15.0000 mL | Freq: Once | OROMUCOSAL | Status: DC

## 2015-12-07 MED ORDER — CHLORHEXIDINE GLUCONATE 4 % EX LIQD
60.0000 mL | Freq: Once | CUTANEOUS | Status: AC
  Administered 2015-12-08: 4 via TOPICAL
  Filled 2015-12-07: qty 60

## 2015-12-07 MED ORDER — CEFUROXIME SODIUM 1.5 G IJ SOLR
1.5000 g | INTRAMUSCULAR | Status: AC
  Administered 2015-12-08: .75 g via INTRAVENOUS
  Administered 2015-12-08: 1.5 g via INTRAVENOUS
  Filled 2015-12-07: qty 1.5

## 2015-12-07 MED ORDER — DEXMEDETOMIDINE HCL IN NACL 400 MCG/100ML IV SOLN
0.1000 ug/kg/h | INTRAVENOUS | Status: AC
  Administered 2015-12-08: .3 ug/kg/h via INTRAVENOUS
  Filled 2015-12-07: qty 100

## 2015-12-07 MED ORDER — PHENYLEPHRINE HCL 10 MG/ML IJ SOLN
30.0000 ug/min | INTRAMUSCULAR | Status: DC
  Filled 2015-12-07: qty 2

## 2015-12-07 MED ORDER — TEMAZEPAM 15 MG PO CAPS
15.0000 mg | ORAL_CAPSULE | Freq: Once | ORAL | Status: AC | PRN
  Administered 2015-12-07: 15 mg via ORAL
  Filled 2015-12-07: qty 1

## 2015-12-07 MED ORDER — NITROGLYCERIN IN D5W 200-5 MCG/ML-% IV SOLN
2.0000 ug/min | INTRAVENOUS | Status: DC
  Administered 2015-12-08: 20 ug/min via INTRAVENOUS
  Filled 2015-12-07: qty 250

## 2015-12-07 NOTE — Progress Notes (Signed)
Patient ambulated 300ft and tolerated it well. 

## 2015-12-07 NOTE — Progress Notes (Signed)
CARDIAC REHAB PHASE I   PRE:  Rate/Rhythm: 26 SR c/ PVCs  BP:  Sitting: 105/72        SaO2: 96 RA  MODE:  Ambulation: 350 ft   POST:  Rate/Rhythm: 103 ST c/ PVCs  BP:  Sitting: 145/82         SaO2: 97 RA  Pt ambulated 350 ft on RA, hand held assist, mildly unsteady gait, tolerated well. Pt c/o mild DOE, denies any other complaints. Pre-op education completed with pt and wife at bedside. Reviewed IS, sternal precautions, activity progression, cardiac surgery booklet and cardiac surgery guidelines. Provided instructions to view cardiac surgery videos. Pt and wife verbalized understanding, appreciative of information. Pt to chair after walk, call bell within reach. Will follow post-op.   [place], RN, BSN 12/07/2015 12:18 PM

## 2015-12-07 NOTE — Progress Notes (Signed)
Pre-op Cardiac Surgery  Carotid Findings:  Findings consistent with 1-39 percent stenosis involving the right internal carotid artery and the left internal carotid artery. Bilateral vertebral artery antegrade.  Upper Extremity Right Left  Brachial Pressures 134 Triphasic 143 Triphasic  Radial Waveforms Triphasic Triphasic  Ulnar Waveforms Triphasic Triphasic  Palmar Arch (Allen's Test) Normal Decreased >50% in left ulnar artery with compressions.   Findings:   Palmer arch evaluation-Doppler waveforms remained normal limits with compression in the right arm and the left radial artery. Suggesting left Ulnar artery decrease >50% with compressions at rest.   Lower  Extremity Right Left  Dorsalis Pedis 117 Triphasic 183 Triphasic  Posterior Tibial 178 Triphasic 189 Triphasic  Ankle/Brachial Indices 1.24 1.32    Findings:  ABI's and Doppler waveforms bilaterally are with normal limits at rest.

## 2015-12-07 NOTE — Progress Notes (Signed)
Patient Name: Frederick Davis Date of Encounter: 12/07/2015  Principal Problem:   Chest pain with moderate risk of acute coronary syndrome Active Problems:   Essential hypertension, benign   CKD (chronic kidney disease) stage 3, GFR 30-59 ml/min   History of STEMI- May 2014   CAD S/P RCA thrombectomy May 2014- residual LAD disease then   History of renal cell cancer-s/p nephrectomy   Hyperlipidemia   Aortic stenosis, mild-2014   Unstable angina ([place])   Length of Stay: 2  SUBJECTIVE  Asymptomatic at rest and ambulating in hall. Severe stenoses in proximal LAD, major diagonal branch and RC at cath. Normal LVEDP at cath, normal LVEF by echo. Minimal change in creatinine today. Carotid Dopplers and venous mapping not yet performed. Has had PFTs.  CURRENT MEDS . [START ON 12/08/2015] aminocaproic acid (AMICAR) for OHS   Intravenous To OR  . aspirin EC  81 mg Oral Daily  . bisacodyl  5 mg Oral Once  . [START ON 12/08/2015] cefUROXime (ZINACEF)  IV  1.5 g Intravenous To OR  . [START ON 12/08/2015] cefUROXime (ZINACEF)  IV  750 mg Intravenous To OR  . chlorhexidine  60 mL Topical Once   And  . [START ON 12/08/2015] chlorhexidine  60 mL Topical Once  . [START ON 12/08/2015] chlorhexidine  15 mL Mouth/Throat Once  . [START ON 12/08/2015] dexmedetomidine  0.1-0.7 mcg/kg/hr Intravenous To OR  . [START ON 12/08/2015] diazepam  2 mg Oral Once  . [START ON 12/08/2015] DOPamine  0-10 mcg/kg/min Intravenous To OR  . [START ON 12/08/2015] epinephrine  0-10 mcg/min Intravenous To OR  . ferrous gluconate  324 mg Oral Q breakfast  . [START ON 12/08/2015] heparin-papaverine-plasmalyte irrigation   Irrigation To OR  . [START ON 12/08/2015] heparin 30,000 units/NS 1000 mL solution for CELLSAVER   Other To OR  . [START ON 12/08/2015] insulin (NOVOLIN-R) infusion   Intravenous To OR  . isosorbide mononitrate  30 mg Oral Daily  . levothyroxine  75 mcg Oral QAC breakfast  . [START ON 12/08/2015] magnesium  sulfate  40 mEq Other To OR  . [START ON 12/08/2015] metoprolol tartrate  12.5 mg Oral Once  . multivitamin with minerals  1 tablet Oral Daily  . [START ON 12/08/2015] nitroGLYCERIN  2-200 mcg/min Intravenous To OR  . pantoprazole  40 mg Oral Daily  . [START ON 12/08/2015] phenylephrine (NEO-SYNEPHRINE) Adult infusion  30-200 mcg/min Intravenous To OR  . [START ON 12/08/2015] potassium chloride  80 mEq Other To OR  . sodium chloride flush  3 mL Intravenous Q12H  . [START ON 12/08/2015] vancomycin  1,250 mg Intravenous To OR    OBJECTIVE   Intake/Output Summary (Last 24 hours) at 12/07/15 1359 Last data filed at 12/07/15 1230  Gross per 24 hour  Intake    720 ml  Output   1351 ml  Net   -631 ml   Filed Weights   12/05/15 1611 12/06/15 0601 12/07/15 0559  Weight: 77.9 kg (171 lb 11.8 oz) 77.2 kg (170 lb 3.1 oz) 75.9 kg (167 lb 5.3 oz)    PHYSICAL EXAM Filed Vitals:   12/06/15 1926 12/07/15 0332 12/07/15 0559 12/07/15 1341  BP: 149/94 139/89  114/70  Pulse: 60 73  73  Temp: 97.5 F (36.4 C) 98.3 F (36.8 C)  97.5 F (36.4 C)  TempSrc: Oral Oral  Oral  Resp: 18 18  19   Height:   5\' 6"  (1.676 m)   Weight:   75.9  kg (167 lb 5.3 oz)   SpO2: 97% 94%  98%   General appearance: alert, cooperative and no distress Neck: no carotid bruit and no JVD Lungs: clear to auscultation bilaterally Heart: regular rate and rhythm and 2/6 systolic murmur AOV, LSB Abdomen: soft, non-tender; bowel sounds normal; no masses, no organomegaly Extremities: extremities normal, atraumatic, no cyanosis or edema Pulses: 2+ and symmetric Skin: Skin color, texture, turgor normal. No rashes or lesions Neurologic: Grossly normal  LABS  CBC  Recent Labs  12/06/15 0230 12/07/15 0300  WBC 6.4 8.2  NEUTROABS 4.1  --   HGB 10.7* 11.0*  HCT 34.2* 35.0*  MCV 86.8 85.8  PLT 177 [id number]   Basic Metabolic Panel  Recent Labs  12/06/15 0230 12/07/15 0259  NA 139 141  K 4.2 4.5  CL 109 108  CO2 21* 23   GLUCOSE 86 95  BUN 29* 24*  CREATININE 1.49* 1.57*  CALCIUM 9.2 9.7   Liver Function Tests  Recent Labs  12/07/15 0259  AST 20  ALT 14*  ALKPHOS 67  BILITOT 0.7  PROT 5.7*  ALBUMIN 3.3*    Radiology Studies Imaging results have been reviewed and No results found.  TELE NSR  ASSESSMENT AND PLAN Plan for CABG tomorrow with Dr. Roxan Hockey. Discussed typical postop course, possible complications. Likely at higher risk for postop atrial fibrillation due to age. Answered questions. Will follow postop.  Sanda Klein, MD, [facility] [phone] office [phone] pager 12/07/2015 1:59 PM

## 2015-12-07 NOTE — Anesthesia Preprocedure Evaluation (Signed)
Anesthesia Evaluation  Patient identified by MRN, date of birth, ID band Patient awake    Reviewed: Allergy & Precautions, H&P , NPO status , Patient's Chart, lab work & pertinent test results, reviewed documented beta blocker date and time   Airway Mallampati: III  TM Distance: >3 FB Neck ROM: Full    Dental  (+) Caps, Teeth Intact   Pulmonary former smoker,     + decreased breath sounds      Cardiovascular hypertension, + angina + CAD and + Past MI  + dysrhythmias  Rhythm:Regular Rate:Normal     Neuro/Psych    GI/Hepatic hiatal hernia,   Endo/Other  Hypothyroidism   Renal/GU Renal disease     Musculoskeletal  (+) Arthritis ,   Abdominal   Peds  Hematology   Anesthesia Other Findings   Reproductive/Obstetrics                             Anesthesia Physical  Anesthesia Plan  ASA: IV  Anesthesia Plan: General   Post-op Pain Management:    Induction: Intravenous  Airway Management Planned: Oral ETT  Additional Equipment: Arterial line, TEE, Ultrasound Guidance Line Placement and CVP  Intra-op Plan:   Post-operative Plan: Post-operative intubation/ventilation  Informed Consent: I have reviewed the patients History and Physical, chart, labs and discussed the procedure including the risks, benefits and alternatives for the proposed anesthesia with the patient or authorized representative who has indicated his/her understanding and acceptance.   Dental advisory given  Plan Discussed with: CRNA  Anesthesia Plan Comments:         Anesthesia Quick Evaluation

## 2015-12-08 ENCOUNTER — Inpatient Hospital Stay (HOSPITAL_COMMUNITY): Payer: Medicare Other | Admitting: Certified Registered Nurse Anesthetist

## 2015-12-08 ENCOUNTER — Inpatient Hospital Stay (HOSPITAL_COMMUNITY): Payer: Medicare Other

## 2015-12-08 ENCOUNTER — Encounter (HOSPITAL_COMMUNITY)
Admission: RE | Disposition: A | Discharge status: Home Health | Payer: Self-pay | Source: Ambulatory Visit | Attending: Thoracic Surgery (Cardiothoracic Vascular Surgery)

## 2015-12-08 DIAGNOSIS — I25110 Atherosclerotic heart disease of native coronary artery with unstable angina pectoris: Secondary | ICD-10-CM

## 2015-12-08 DIAGNOSIS — Z951 Presence of aortocoronary bypass graft: Secondary | ICD-10-CM

## 2015-12-08 HISTORY — PX: TEE WITHOUT CARDIOVERSION: SHX5443

## 2015-12-08 HISTORY — PX: CORONARY ARTERY BYPASS GRAFT: SHX141

## 2015-12-08 LAB — POCT I-STAT, CHEM 8
BUN: 21 mg/dL — AB (ref 6–20)
BUN: 18 mg/dL (ref 6–20)
BUN: 20 mg/dL (ref 6–20)
BUN: 19 mg/dL (ref 6–20)
BUN: 19 mg/dL (ref 6–20)
CALCIUM ION: 1.16 mmol/L (ref 1.13–1.30)
CALCIUM ION: 1.21 mmol/L (ref 1.13–1.30)
CALCIUM ION: 1.25 mmol/L (ref 1.13–1.30)
CHLORIDE: 107 mmol/L (ref 101–111)
CHLORIDE: 102 mmol/L (ref 101–111)
CHLORIDE: 106 mmol/L (ref 101–111)
CREATININE: 1.1 mg/dL (ref 0.61–1.24)
CREATININE: 1.1 mg/dL (ref 0.61–1.24)
CREATININE: 1 mg/dL (ref 0.61–1.24)
Calcium, Ion: 1.36 mmol/L — ABNORMAL HIGH (ref 1.13–1.30)
Calcium, Ion: 1.21 mmol/L (ref 1.13–1.30)
Chloride: 104 mmol/L (ref 101–111)
Chloride: 105 mmol/L (ref 101–111)
Creatinine, Ser: 1.2 mg/dL (ref 0.61–1.24)
Creatinine, Ser: 1.2 mg/dL (ref 0.61–1.24)
GLUCOSE: 89 mg/dL (ref 65–99)
GLUCOSE: 136 mg/dL — AB (ref 65–99)
Glucose, Bld: 95 mg/dL (ref 65–99)
Glucose, Bld: 99 mg/dL (ref 65–99)
Glucose, Bld: 137 mg/dL — ABNORMAL HIGH (ref 65–99)
HCT: 24 % — ABNORMAL LOW (ref 39.0–52.0)
HCT: 25 % — ABNORMAL LOW (ref 39.0–52.0)
HCT: 27 % — ABNORMAL LOW (ref 39.0–52.0)
HEMATOCRIT: 28 % — AB (ref 39.0–52.0)
HEMATOCRIT: 24 % — AB (ref 39.0–52.0)
HEMOGLOBIN: 8.5 g/dL — AB (ref 13.0–17.0)
Hemoglobin: 9.5 g/dL — ABNORMAL LOW (ref 13.0–17.0)
Hemoglobin: 8.2 g/dL — ABNORMAL LOW (ref 13.0–17.0)
Hemoglobin: 8.2 g/dL — ABNORMAL LOW (ref 13.0–17.0)
Hemoglobin: 9.2 g/dL — ABNORMAL LOW (ref 13.0–17.0)
POTASSIUM: 4.3 mmol/L (ref 3.5–5.1)
POTASSIUM: 4.6 mmol/L (ref 3.5–5.1)
Potassium: 4.2 mmol/L (ref 3.5–5.1)
Potassium: 4.3 mmol/L (ref 3.5–5.1)
Potassium: 5.1 mmol/L (ref 3.5–5.1)
SODIUM: 139 mmol/L (ref 135–145)
SODIUM: 140 mmol/L (ref 135–145)
Sodium: 136 mmol/L (ref 135–145)
Sodium: 138 mmol/L (ref 135–145)
Sodium: 137 mmol/L (ref 135–145)
TCO2: 29 mmol/L (ref 0–100)
TCO2: 25 mmol/L (ref 0–100)
TCO2: 26 mmol/L (ref 0–100)
TCO2: 25 mmol/L (ref 0–100)
TCO2: 22 mmol/L (ref 0–100)

## 2015-12-08 LAB — POCT I-STAT 4, (NA,K, GLUC, HGB,HCT)
GLUCOSE: 99 mg/dL (ref 65–99)
HEMATOCRIT: 30 % — AB (ref 39.0–52.0)
Hemoglobin: 10.2 g/dL — ABNORMAL LOW (ref 13.0–17.0)
POTASSIUM: 4.2 mmol/L (ref 3.5–5.1)
Sodium: 139 mmol/L (ref 135–145)

## 2015-12-08 LAB — POCT I-STAT 3, ART BLOOD GAS (G3+)
Acid-base deficit: 2 mmol/L (ref 0.0–2.0)
Acid-base deficit: 4 mmol/L — ABNORMAL HIGH (ref 0.0–2.0)
Acid-base deficit: 6 mmol/L — ABNORMAL HIGH (ref 0.0–2.0)
Acid-base deficit: 5 mmol/L — ABNORMAL HIGH (ref 0.0–2.0)
Bicarbonate: 24.3 mEq/L — ABNORMAL HIGH (ref 20.0–24.0)
Bicarbonate: 21.6 mEq/L (ref 20.0–24.0)
Bicarbonate: 17.4 mEq/L — ABNORMAL LOW (ref 20.0–24.0)
Bicarbonate: 21.6 mEq/L (ref 20.0–24.0)
O2 SAT: 100 %
O2 SAT: 99 %
O2 Saturation: 93 %
O2 Saturation: 99 %
PCO2 ART: 45.9 mmHg — AB (ref 35.0–45.0)
PCO2 ART: 40.5 mmHg (ref 35.0–45.0)
PCO2 ART: 25.4 mmHg — AB (ref 35.0–45.0)
PCO2 ART: 45 mmHg (ref 35.0–45.0)
PH ART: 7.287 — AB (ref 7.350–7.450)
PO2 ART: 343 mmHg — AB (ref 80.0–100.0)
PO2 ART: 125 mmHg — AB (ref 80.0–100.0)
Patient temperature: 37.3
Patient temperature: 37
Patient temperature: 36.8
TCO2: 26 mmol/L (ref 0–100)
TCO2: 23 mmol/L (ref 0–100)
TCO2: 18 mmol/L (ref 0–100)
TCO2: 23 mmol/L (ref 0–100)
pH, Arterial: 7.332 — ABNORMAL LOW (ref 7.350–7.450)
pH, Arterial: 7.337 — ABNORMAL LOW (ref 7.350–7.450)
pH, Arterial: 7.444 (ref 7.350–7.450)
pO2, Arterial: 71 mmHg — ABNORMAL LOW (ref 80.0–100.0)
pO2, Arterial: 130 mmHg — ABNORMAL HIGH (ref 80.0–100.0)

## 2015-12-08 LAB — CBC
HEMATOCRIT: 30.5 % — AB (ref 39.0–52.0)
HEMATOCRIT: 27.6 % — AB (ref 39.0–52.0)
HEMOGLOBIN: 8.7 g/dL — AB (ref 13.0–17.0)
Hemoglobin: 9.6 g/dL — ABNORMAL LOW (ref 13.0–17.0)
MCH: 27.3 pg (ref 26.0–34.0)
MCH: 27.4 pg (ref 26.0–34.0)
MCHC: 31.5 g/dL (ref 30.0–36.0)
MCHC: 31.5 g/dL (ref 30.0–36.0)
MCV: 86.6 fL (ref 78.0–100.0)
MCV: 87.1 fL (ref 78.0–100.0)
PLATELETS: 122 10*3/uL — AB (ref 150–400)
Platelets: 122 10*3/uL — ABNORMAL LOW (ref 150–400)
RBC: 3.52 MIL/uL — ABNORMAL LOW (ref 4.22–5.81)
RBC: 3.17 MIL/uL — AB (ref 4.22–5.81)
RDW: 14.3 % (ref 11.5–15.5)
RDW: 14.4 % (ref 11.5–15.5)
WBC: 9.2 10*3/uL (ref 4.0–10.5)
WBC: 12.4 10*3/uL — ABNORMAL HIGH (ref 4.0–10.5)

## 2015-12-08 LAB — PROTIME-INR
INR: 1.44 (ref 0.00–1.49)
Prothrombin Time: 17.6 seconds — ABNORMAL HIGH (ref 11.6–15.2)

## 2015-12-08 LAB — GLUCOSE, CAPILLARY
Glucose-Capillary: 104 mg/dL — ABNORMAL HIGH (ref 65–99)
Glucose-Capillary: 106 mg/dL — ABNORMAL HIGH (ref 65–99)

## 2015-12-08 LAB — PLATELET COUNT: PLATELETS: 142 10*3/uL — AB (ref 150–400)

## 2015-12-08 LAB — CREATININE, SERUM
CREATININE: 1.43 mg/dL — AB (ref 0.61–1.24)
GFR calc Af Amer: 48 mL/min — ABNORMAL LOW (ref >60)
GFR calc non Af Amer: 41 mL/min — ABNORMAL LOW (ref >60)

## 2015-12-08 LAB — HEMOGLOBIN A1C
HEMOGLOBIN A1C: 5.7 % — AB (ref 4.8–5.6)
MEAN PLASMA GLUCOSE: 117 mg/dL

## 2015-12-08 LAB — PREPARE RBC (CROSSMATCH)

## 2015-12-08 LAB — HEMOGLOBIN AND HEMATOCRIT, BLOOD
HEMATOCRIT: 23.4 % — AB (ref 39.0–52.0)
HEMOGLOBIN: 7.7 g/dL — AB (ref 13.0–17.0)

## 2015-12-08 LAB — MAGNESIUM: Magnesium: 1.9 mg/dL (ref 1.7–2.4)

## 2015-12-08 LAB — APTT: APTT: 28 s (ref 24–37)

## 2015-12-08 SURGERY — CORONARY ARTERY BYPASS GRAFTING (CABG)
Anesthesia: General | Site: Chest

## 2015-12-08 MED ORDER — METOPROLOL TARTRATE 12.5 MG HALF TABLET: 12.5000 mg | ORAL_TABLET | Freq: Two times a day (BID) | ORAL | Status: DC

## 2015-12-08 MED ORDER — SODIUM CHLORIDE 0.9 % IJ SOLN
OROMUCOSAL | Status: DC | PRN
  Administered 2015-12-08 (×3): 1 mL via TOPICAL

## 2015-12-08 MED ORDER — ASPIRIN EC 325 MG PO TBEC
325.0000 mg | DELAYED_RELEASE_TABLET | Freq: Every day | ORAL | Status: DC
  Administered 2015-12-09 – 2015-12-11 (×3): 325 mg via ORAL
  Filled 2015-12-08 (×4): qty 1

## 2015-12-08 MED ORDER — MIDAZOLAM HCL 2 MG/2ML IJ SOLN: 2.0000 mg | INTRAMUSCULAR | Status: AC | PRN

## 2015-12-08 MED ORDER — NITROGLYCERIN IN D5W 200-5 MCG/ML-% IV SOLN: 0.0000 ug/min | INTRAVENOUS | Status: DC

## 2015-12-08 MED ORDER — PROTAMINE SULFATE 10 MG/ML IV SOLN
INTRAVENOUS | Status: AC
  Filled 2015-12-08: qty 25

## 2015-12-08 MED ORDER — LACTATED RINGERS IV SOLN: INTRAVENOUS | Status: DC

## 2015-12-08 MED ORDER — EPHEDRINE SULFATE 50 MG/ML IJ SOLN
INTRAMUSCULAR | Status: AC
  Filled 2015-12-08: qty 2

## 2015-12-08 MED ORDER — ONDANSETRON HCL 4 MG/2ML IJ SOLN
4.0000 mg | Freq: Four times a day (QID) | INTRAMUSCULAR | Status: DC | PRN
  Administered 2015-12-08 – 2015-12-11 (×4): 4 mg via INTRAVENOUS
  Filled 2015-12-08 (×4): qty 2

## 2015-12-08 MED ORDER — FENTANYL CITRATE (PF) 250 MCG/5ML IJ SOLN
INTRAMUSCULAR | Status: AC
  Filled 2015-12-08: qty 10

## 2015-12-08 MED ORDER — DEXMEDETOMIDINE HCL IN NACL 200 MCG/50ML IV SOLN
0.0000 ug/kg/h | INTRAVENOUS | Status: DC
  Filled 2015-12-08: qty 50

## 2015-12-08 MED ORDER — LACTATED RINGERS IV SOLN
INTRAVENOUS | Status: DC | PRN
  Administered 2015-12-08: 08:00:00 via INTRAVENOUS

## 2015-12-08 MED ORDER — BISACODYL 10 MG RE SUPP: 10.0000 mg | Freq: Every day | RECTAL | Status: DC

## 2015-12-08 MED ORDER — MIDAZOLAM HCL 10 MG/2ML IJ SOLN
INTRAMUSCULAR | Status: AC
  Filled 2015-12-08: qty 2

## 2015-12-08 MED ORDER — CHLORHEXIDINE GLUCONATE 0.12 % MT SOLN
15.0000 mL | OROMUCOSAL | Status: AC
  Administered 2015-12-08: 15 mL via OROMUCOSAL

## 2015-12-08 MED ORDER — METOCLOPRAMIDE HCL 5 MG/ML IJ SOLN
10.0000 mg | Freq: Four times a day (QID) | INTRAMUSCULAR | Status: AC
  Administered 2015-12-08 – 2015-12-09 (×3): 10 mg via INTRAVENOUS
  Filled 2015-12-08 (×3): qty 2

## 2015-12-08 MED ORDER — DEXTROSE 5 % IV SOLN: 0.0000 ug/min | INTRAVENOUS | Status: DC

## 2015-12-08 MED ORDER — SODIUM BICARBONATE 8.4 % IV SOLN
50.0000 meq | Freq: Once | INTRAVENOUS | Status: AC
  Administered 2015-12-08: 50 meq via INTRAVENOUS

## 2015-12-08 MED ORDER — FENTANYL CITRATE (PF) 100 MCG/2ML IJ SOLN
INTRAMUSCULAR | Status: DC | PRN
  Administered 2015-12-08 (×6): 250 ug via INTRAVENOUS

## 2015-12-08 MED ORDER — ACETAMINOPHEN 160 MG/5ML PO SOLN: 1000.0000 mg | Freq: Four times a day (QID) | ORAL | Status: DC

## 2015-12-08 MED ORDER — FENTANYL CITRATE (PF) 250 MCG/5ML IJ SOLN
INTRAMUSCULAR | Status: AC
  Filled 2015-12-08: qty 20

## 2015-12-08 MED ORDER — SODIUM CHLORIDE 0.9% FLUSH
3.0000 mL | Freq: Two times a day (BID) | INTRAVENOUS | Status: DC
  Administered 2015-12-09 – 2015-12-10 (×3): 3 mL via INTRAVENOUS
  Administered 2015-12-11: 10 mL via INTRAVENOUS
  Administered 2015-12-11: 3 mL via INTRAVENOUS

## 2015-12-08 MED ORDER — ALBUMIN HUMAN 5 % IV SOLN
250.0000 mL | INTRAVENOUS | Status: AC | PRN
  Administered 2015-12-08 (×4): 250 mL via INTRAVENOUS
  Filled 2015-12-08 (×2): qty 250

## 2015-12-08 MED ORDER — SODIUM CHLORIDE 0.9 % IV SOLN
INTRAVENOUS | Status: DC
  Filled 2015-12-08: qty 2.5

## 2015-12-08 MED ORDER — MORPHINE SULFATE (PF) 2 MG/ML IV SOLN
1.0000 mg | INTRAVENOUS | Status: AC | PRN
  Administered 2015-12-08: 2 mg via INTRAVENOUS

## 2015-12-08 MED ORDER — METOPROLOL TARTRATE 1 MG/ML IV SOLN: 2.5000 mg | INTRAVENOUS | Status: DC | PRN

## 2015-12-08 MED ORDER — ARTIFICIAL TEARS OP OINT
TOPICAL_OINTMENT | OPHTHALMIC | Status: DC | PRN
Start: 2015-12-08 — End: 2015-12-08
  Administered 2015-12-08: 1 via OPHTHALMIC

## 2015-12-08 MED ORDER — SODIUM CHLORIDE 0.9 % IV SOLN: 250.0000 mL | INTRAVENOUS | Status: DC

## 2015-12-08 MED ORDER — ROCURONIUM BROMIDE 100 MG/10ML IV SOLN
INTRAVENOUS | Status: DC | PRN
  Administered 2015-12-08: 100 mg via INTRAVENOUS
  Administered 2015-12-08: 50 mg via INTRAVENOUS

## 2015-12-08 MED ORDER — DOCUSATE SODIUM 100 MG PO CAPS
200.0000 mg | ORAL_CAPSULE | Freq: Every day | ORAL | Status: DC
  Administered 2015-12-09 – 2015-12-10 (×2): 200 mg via ORAL
  Filled 2015-12-08 (×4): qty 2

## 2015-12-08 MED ORDER — ACETAMINOPHEN 500 MG PO TABS
1000.0000 mg | ORAL_TABLET | Freq: Four times a day (QID) | ORAL | Status: DC
  Administered 2015-12-09 – 2015-12-11 (×4): 1000 mg via ORAL
  Filled 2015-12-08 (×4): qty 2

## 2015-12-08 MED ORDER — ACETAMINOPHEN 650 MG RE SUPP: 650.0000 mg | Freq: Once | RECTAL | Status: AC

## 2015-12-08 MED ORDER — TRAMADOL HCL 50 MG PO TABS
50.0000 mg | ORAL_TABLET | ORAL | Status: DC | PRN
Start: 2015-12-08 — End: 2015-12-14

## 2015-12-08 MED ORDER — PHENYLEPHRINE HCL 10 MG/ML IJ SOLN
10.0000 mg | INTRAVENOUS | Status: DC | PRN
  Administered 2015-12-08: 20 ug/min via INTRAVENOUS

## 2015-12-08 MED ORDER — ASPIRIN 81 MG PO CHEW
324.0000 mg | CHEWABLE_TABLET | Freq: Every day | ORAL | Status: DC
Start: 2015-12-09 — End: 2015-12-12

## 2015-12-08 MED ORDER — LACTATED RINGERS IV SOLN: 500.0000 mL | Freq: Once | INTRAVENOUS | Status: DC | PRN

## 2015-12-08 MED ORDER — DEXTROSE 5 % IV SOLN
1.5000 g | Freq: Two times a day (BID) | INTRAVENOUS | Status: AC
  Administered 2015-12-08 – 2015-12-10 (×4): 1.5 g via INTRAVENOUS
  Filled 2015-12-08 (×4): qty 1.5

## 2015-12-08 MED ORDER — PHENYLEPHRINE HCL 10 MG/ML IJ SOLN
0.0000 ug/min | INTRAMUSCULAR | Status: DC
  Filled 2015-12-08 (×2): qty 2

## 2015-12-08 MED ORDER — ACETAMINOPHEN 160 MG/5ML PO SOLN
650.0000 mg | Freq: Once | ORAL | Status: AC
Start: 2015-12-08 — End: 2015-12-08
  Administered 2015-12-08: 650 mg

## 2015-12-08 MED ORDER — BISACODYL 5 MG PO TBEC
10.0000 mg | DELAYED_RELEASE_TABLET | Freq: Every day | ORAL | Status: DC
  Administered 2015-12-09 – 2015-12-10 (×2): 10 mg via ORAL
  Filled 2015-12-08 (×4): qty 2

## 2015-12-08 MED ORDER — MIDAZOLAM HCL 2 MG/2ML IJ SOLN
INTRAMUSCULAR | Status: AC
  Filled 2015-12-08: qty 6

## 2015-12-08 MED ORDER — SODIUM CHLORIDE 0.9% FLUSH: 3.0000 mL | INTRAVENOUS | Status: DC | PRN

## 2015-12-08 MED ORDER — OXYCODONE HCL 5 MG PO TABS
5.0000 mg | ORAL_TABLET | ORAL | Status: DC | PRN
Start: 2015-12-08 — End: 2015-12-12
  Administered 2015-12-09 – 2015-12-10 (×5): 10 mg via ORAL
  Filled 2015-12-08 (×5): qty 2

## 2015-12-08 MED ORDER — PLASMA-LYTE 148 IV SOLN
INTRAVENOUS | Status: DC | PRN
  Administered 2015-12-08: 500 mL via INTRAVASCULAR

## 2015-12-08 MED ORDER — SODIUM CHLORIDE 0.9 % IJ SOLN
INTRAMUSCULAR | Status: AC
  Filled 2015-12-08: qty 10

## 2015-12-08 MED ORDER — PHENYLEPHRINE 40 MCG/ML (10ML) SYRINGE FOR IV PUSH (FOR BLOOD PRESSURE SUPPORT)
PREFILLED_SYRINGE | INTRAVENOUS | Status: AC
  Filled 2015-12-08: qty 10

## 2015-12-08 MED ORDER — POTASSIUM CHLORIDE 10 MEQ/50ML IV SOLN: 10.0000 meq | INTRAVENOUS | Status: AC

## 2015-12-08 MED ORDER — HEPARIN SODIUM (PORCINE) 1000 UNIT/ML IJ SOLN
INTRAMUSCULAR | Status: AC
  Filled 2015-12-08: qty 1

## 2015-12-08 MED ORDER — PROPOFOL 10 MG/ML IV BOLUS
INTRAVENOUS | Status: DC | PRN
  Administered 2015-12-08: 60 mg via INTRAVENOUS

## 2015-12-08 MED ORDER — MORPHINE SULFATE (PF) 2 MG/ML IV SOLN
2.0000 mg | INTRAVENOUS | Status: DC | PRN
  Administered 2015-12-08: 4 mg via INTRAVENOUS
  Administered 2015-12-08 – 2015-12-09 (×4): 2 mg via INTRAVENOUS
  Filled 2015-12-08 (×2): qty 1
  Filled 2015-12-08: qty 2
  Filled 2015-12-08 (×2): qty 1

## 2015-12-08 MED ORDER — FAMOTIDINE IN NACL 20-0.9 MG/50ML-% IV SOLN
20.0000 mg | Freq: Two times a day (BID) | INTRAVENOUS | Status: AC
  Administered 2015-12-08: 20 mg via INTRAVENOUS

## 2015-12-08 MED ORDER — INSULIN ASPART 100 UNIT/ML ~~LOC~~ SOLN
0.0000 [IU] | SUBCUTANEOUS | Status: DC
  Administered 2015-12-08 – 2015-12-09 (×2): 2 [IU] via SUBCUTANEOUS

## 2015-12-08 MED ORDER — SODIUM CHLORIDE 0.45 % IV SOLN
INTRAVENOUS | Status: DC | PRN
  Administered 2015-12-08: 14:00:00 via INTRAVENOUS

## 2015-12-08 MED ORDER — NOREPINEPHRINE BITARTRATE 1 MG/ML IV SOLN
0.0000 ug/min | INTRAVENOUS | Status: DC
  Filled 2015-12-08: qty 4

## 2015-12-08 MED ORDER — ALBUMIN HUMAN 5 % IV SOLN
INTRAVENOUS | Status: DC | PRN
  Administered 2015-12-08: 13:00:00 via INTRAVENOUS

## 2015-12-08 MED ORDER — HEMOSTATIC AGENTS (NO CHARGE) OPTIME
TOPICAL | Status: DC | PRN
  Administered 2015-12-08: 1 via TOPICAL

## 2015-12-08 MED ORDER — MIDAZOLAM HCL 5 MG/5ML IJ SOLN
INTRAMUSCULAR | Status: DC | PRN
  Administered 2015-12-08: 2 mg via INTRAVENOUS

## 2015-12-08 MED ORDER — PROTAMINE SULFATE 10 MG/ML IV SOLN
INTRAVENOUS | Status: DC | PRN
  Administered 2015-12-08: 300 mg via INTRAVENOUS

## 2015-12-08 MED ORDER — PANTOPRAZOLE SODIUM 40 MG PO TBEC
40.0000 mg | DELAYED_RELEASE_TABLET | Freq: Every day | ORAL | Status: DC
  Administered 2015-12-10 – 2015-12-13 (×4): 40 mg via ORAL
  Filled 2015-12-08 (×5): qty 1

## 2015-12-08 MED ORDER — SODIUM CHLORIDE 0.9 % IV SOLN
INTRAVENOUS | Status: DC
  Administered 2015-12-08: 15:00:00 via INTRAVENOUS

## 2015-12-08 MED ORDER — MAGNESIUM SULFATE 4 GM/100ML IV SOLN
4.0000 g | Freq: Once | INTRAVENOUS | Status: DC
  Filled 2015-12-08: qty 100

## 2015-12-08 MED ORDER — METOPROLOL TARTRATE 25 MG/10 ML ORAL SUSPENSION
12.5000 mg | Freq: Two times a day (BID) | ORAL | Status: DC
Start: 2015-12-08 — End: 2015-12-11

## 2015-12-08 MED ORDER — 0.9 % SODIUM CHLORIDE (POUR BTL) OPTIME
TOPICAL | Status: DC | PRN
Start: 2015-12-08 — End: 2015-12-08
  Administered 2015-12-08: 1000 mL

## 2015-12-08 MED ORDER — HEPARIN SODIUM (PORCINE) 1000 UNIT/ML IJ SOLN
INTRAMUSCULAR | Status: DC | PRN
  Administered 2015-12-08: 2000 [IU] via INTRAVENOUS
  Administered 2015-12-08: 38000 [IU] via INTRAVENOUS

## 2015-12-08 MED ORDER — INSULIN REGULAR BOLUS VIA INFUSION
0.0000 [IU] | Freq: Three times a day (TID) | INTRAVENOUS | Status: DC
  Filled 2015-12-08: qty 10

## 2015-12-08 MED ORDER — VANCOMYCIN HCL IN DEXTROSE 1-5 GM/200ML-% IV SOLN
1000.0000 mg | Freq: Once | INTRAVENOUS | Status: AC
  Administered 2015-12-08: 1000 mg via INTRAVENOUS
  Filled 2015-12-08: qty 200

## 2015-12-08 MED FILL — Magnesium Sulfate Inj 50%: INTRAMUSCULAR | Qty: 10 | Status: AC

## 2015-12-08 MED FILL — Heparin Sodium (Porcine) Inj 1000 Unit/ML: INTRAMUSCULAR | Qty: 20 | Status: AC

## 2015-12-08 MED FILL — Mannitol IV Soln 20%: INTRAVENOUS | Qty: 500 | Status: AC

## 2015-12-08 MED FILL — Sodium Bicarbonate IV Soln 8.4%: INTRAVENOUS | Qty: 50 | Status: AC

## 2015-12-08 MED FILL — Electrolyte-R (PH 7.4) Solution: INTRAVENOUS | Qty: 4000 | Status: AC

## 2015-12-08 MED FILL — Lidocaine HCl IV Inj 20 MG/ML: INTRAVENOUS | Qty: 5 | Status: AC

## 2015-12-08 MED FILL — Sodium Chloride IV Soln 0.9%: INTRAVENOUS | Qty: 2000 | Status: AC

## 2015-12-08 MED FILL — Heparin Sodium (Porcine) Inj 1000 Unit/ML: INTRAMUSCULAR | Qty: 30 | Status: AC

## 2015-12-08 MED FILL — Potassium Chloride Inj 2 mEq/ML: INTRAVENOUS | Qty: 40 | Status: AC

## 2015-12-08 SURGICAL SUPPLY — 88 items
BAG DECANTER FOR FLEXI CONT (MISCELLANEOUS) ×4 IMPLANT
BANDAGE ELASTIC 4 VELCRO ST LF (GAUZE/BANDAGES/DRESSINGS) ×6 IMPLANT
BANDAGE ELASTIC 6 VELCRO ST LF (GAUZE/BANDAGES/DRESSINGS) ×6 IMPLANT
BASKET HEART  (ORDER IN 25'S) (MISCELLANEOUS) ×1
BASKET HEART (ORDER IN 25'S) (MISCELLANEOUS) ×1
BASKET HEART (ORDER IN 25S) (MISCELLANEOUS) ×2 IMPLANT
BLADE STERNUM SYSTEM 6 (BLADE) ×4 IMPLANT
BLADE SURG 11 STRL SS (BLADE) ×2 IMPLANT
BNDG GAUZE ELAST 4 BULKY (GAUZE/BANDAGES/DRESSINGS) ×6 IMPLANT
CANISTER SUCTION 2500CC (MISCELLANEOUS) ×4 IMPLANT
CANNULA EZ GLIDE AORTIC 21FR (CANNULA) ×4 IMPLANT
CATH CPB KIT HENDRICKSON (MISCELLANEOUS) ×4 IMPLANT
CATH ROBINSON RED A/P 18FR (CATHETERS) ×4 IMPLANT
CATH THORACIC 36FR (CATHETERS) ×4 IMPLANT
CATH THORACIC 36FR RT ANG (CATHETERS) ×4 IMPLANT
CLIP TI MEDIUM 24 (CLIP) IMPLANT
CLIP TI WIDE RED SMALL 24 (CLIP) ×4 IMPLANT
CRADLE DONUT ADULT HEAD (MISCELLANEOUS) ×4 IMPLANT
DRAPE CARDIOVASCULAR INCISE (DRAPES) ×4
DRAPE SLUSH/WARMER DISC (DRAPES) ×4 IMPLANT
DRAPE SRG 135X102X78XABS (DRAPES) ×2 IMPLANT
DRSG COVADERM 4X14 (GAUZE/BANDAGES/DRESSINGS) ×4 IMPLANT
ELECT REM PT RETURN 9FT ADLT (ELECTROSURGICAL) ×8
ELECTRODE REM PT RTRN 9FT ADLT (ELECTROSURGICAL) ×4 IMPLANT
GAUZE SPONGE 4X4 12PLY STRL (GAUZE/BANDAGES/DRESSINGS) ×8 IMPLANT
GLOVE BIOGEL M 6.5 STRL (GLOVE) ×10 IMPLANT
GLOVE BIOGEL PI IND STRL 6 (GLOVE) IMPLANT
GLOVE BIOGEL PI IND STRL 7.0 (GLOVE) IMPLANT
GLOVE BIOGEL PI INDICATOR 6 (GLOVE) ×8
GLOVE BIOGEL PI INDICATOR 7.0 (GLOVE) ×8
GLOVE SURG SIGNA 7.5 PF LTX (GLOVE) ×12 IMPLANT
GOWN STRL REUS W/ TWL LRG LVL3 (GOWN DISPOSABLE) ×8 IMPLANT
GOWN STRL REUS W/ TWL XL LVL3 (GOWN DISPOSABLE) ×4 IMPLANT
GOWN STRL REUS W/TWL LRG LVL3 (GOWN DISPOSABLE) ×28
GOWN STRL REUS W/TWL XL LVL3 (GOWN DISPOSABLE) ×8
HEMOSTAT POWDER SURGIFOAM 1G (HEMOSTASIS) ×12 IMPLANT
HEMOSTAT SURGICEL 2X14 (HEMOSTASIS) ×4 IMPLANT
INSERT FOGARTY XLG (MISCELLANEOUS) IMPLANT
KIT BASIN OR (CUSTOM PROCEDURE TRAY) ×4 IMPLANT
KIT ROOM TURNOVER OR (KITS) ×4 IMPLANT
KIT SUCTION CATH 14FR (SUCTIONS) ×8 IMPLANT
KIT VASOVIEW W/TROCAR VH 2000 (KITS) ×4 IMPLANT
MARKER GRAFT CORONARY BYPASS (MISCELLANEOUS) ×12 IMPLANT
NS IRRIG 1000ML POUR BTL (IV SOLUTION) ×20 IMPLANT
PACK OPEN HEART (CUSTOM PROCEDURE TRAY) ×4 IMPLANT
PAD ARMBOARD 7.5X6 YLW CONV (MISCELLANEOUS) ×8 IMPLANT
PAD ELECT DEFIB RADIOL ZOLL (MISCELLANEOUS) ×4 IMPLANT
PENCIL BUTTON HOLSTER BLD 10FT (ELECTRODE) ×4 IMPLANT
PUNCH AORTIC ROTATE  4.5MM 8IN (MISCELLANEOUS) ×2 IMPLANT
PUNCH AORTIC ROTATE 4.0MM (MISCELLANEOUS) IMPLANT
PUNCH AORTIC ROTATE 4.5MM 8IN (MISCELLANEOUS) IMPLANT
PUNCH AORTIC ROTATE 5MM 8IN (MISCELLANEOUS) IMPLANT
SEALANT SURG COSEAL 8ML (VASCULAR PRODUCTS) ×2 IMPLANT
SET CARDIOPLEGIA MPS 5001102 (MISCELLANEOUS) ×2 IMPLANT
SOLUTION ANTI FOG 6CC (MISCELLANEOUS) ×2 IMPLANT
SPONGE GAUZE 4X4 12PLY STER LF (GAUZE/BANDAGES/DRESSINGS) ×6 IMPLANT
SUT BONE WAX W31G (SUTURE) ×4 IMPLANT
SUT MNCRL AB 4-0 PS2 18 (SUTURE) ×4 IMPLANT
SUT PROLENE 3 0 SH DA (SUTURE) ×4 IMPLANT
SUT PROLENE 4 0 RB 1 (SUTURE)
SUT PROLENE 4 0 SH DA (SUTURE) IMPLANT
SUT PROLENE 4-0 RB1 .5 CRCL 36 (SUTURE) IMPLANT
SUT PROLENE 6 0 C 1 30 (SUTURE) ×16 IMPLANT
SUT PROLENE 6 0 CC (SUTURE) ×4 IMPLANT
SUT PROLENE 7 0 BV1 MDA (SUTURE) ×6 IMPLANT
SUT PROLENE 8 0 BV175 6 (SUTURE) IMPLANT
SUT STEEL 6MS V (SUTURE) ×4 IMPLANT
SUT STEEL STERNAL CCS#1 18IN (SUTURE) IMPLANT
SUT STEEL SZ 6 DBL 3X14 BALL (SUTURE) ×4 IMPLANT
SUT VIC AB 1 CTX 36 (SUTURE) ×8
SUT VIC AB 1 CTX36XBRD ANBCTR (SUTURE) ×4 IMPLANT
SUT VIC AB 2-0 CT1 27 (SUTURE) ×8
SUT VIC AB 2-0 CT1 TAPERPNT 27 (SUTURE) IMPLANT
SUT VIC AB 2-0 CTX 27 (SUTURE) IMPLANT
SUT VIC AB 3-0 SH 27 (SUTURE)
SUT VIC AB 3-0 SH 27X BRD (SUTURE) IMPLANT
SUT VIC AB 3-0 X1 27 (SUTURE) IMPLANT
SUT VICRYL 4-0 PS2 18IN ABS (SUTURE) IMPLANT
SUTURE E-PAK OPEN HEART (SUTURE) ×4 IMPLANT
SYSTEM SAHARA CHEST DRAIN ATS (WOUND CARE) ×4 IMPLANT
TAPE CLOTH SURG 4X10 WHT LF (GAUZE/BANDAGES/DRESSINGS) ×6 IMPLANT
TOWEL OR 17X24 6PK STRL BLUE (TOWEL DISPOSABLE) ×8 IMPLANT
TOWEL OR 17X26 10 PK STRL BLUE (TOWEL DISPOSABLE) ×8 IMPLANT
TRAY FOLEY IC TEMP SENS 16FR (CATHETERS) ×4 IMPLANT
TUBE FEEDING 8FR 16IN STR KANG (MISCELLANEOUS) ×4 IMPLANT
TUBING INSUFFLATION (TUBING) ×4 IMPLANT
UNDERPAD 30X30 INCONTINENT (UNDERPADS AND DIAPERS) ×4 IMPLANT
WATER STERILE IRR 1000ML POUR (IV SOLUTION) ×8 IMPLANT

## 2015-12-08 NOTE — Anesthesia Postprocedure Evaluation (Signed)
Anesthesia Post Note  Patient: Frederick Davis [facility]  Procedure(s) Performed: Procedure(s) (LRB): CORONARY ARTERY BYPASS GRAFTING (CABG) (N/A) TRANSESOPHAGEAL ECHOCARDIOGRAM (TEE) (N/A)  Patient location during evaluation: PACU Anesthesia Type: General Level of consciousness: sedated and patient cooperative Pain management: pain level controlled Vital Signs Assessment: post-procedure vital signs reviewed and stable Respiratory status: spontaneous breathing Cardiovascular status: stable Anesthetic complications: no    Last Vitals:  Filed Vitals:   12/08/15 1530 12/08/15 1545  BP: 96/71 91/67  Pulse: 81 70  Temp: 37.1 C 37.1 C  Resp: 12 12    Last Pain:  Filed Vitals:   12/08/15 1549  PainSc: Asleep                 Nolon Nations

## 2015-12-08 NOTE — Progress Notes (Signed)
  Echocardiogram Echocardiogram Transesophageal has been performed.  Frederick Davis 12/08/2015, 9:20 AM

## 2015-12-08 NOTE — Progress Notes (Signed)
UR Completed. Gavyn Zoss, RN, BSN.  [phone] 

## 2015-12-08 NOTE — Anesthesia Procedure Notes (Addendum)
Central Venous Catheter Insertion Performed by: anesthesiologist Patient location: Pre-op. Preanesthetic checklist: patient identified, IV checked, site marked, risks and benefits discussed, surgical consent, monitors and equipment checked, pre-op evaluation, timeout performed and anesthesia consent Position: Trendelenburg Lidocaine 1% used for infiltration Landmarks identified and Seldinger technique used Catheter size: 8.5 Fr Central line was placed.Sheath introducer Swan type and PA catheter depth:thermodilation and 50PA Cath depth:50 Procedure performed using ultrasound guided technique. Attempts: 1 Following insertion, line sutured and dressing applied. Post procedure assessment: blood return through all ports, free fluid flow and no air. Patient tolerated the procedure well with no immediate complications.    Central Venous Catheter Insertion Performed by: anesthesiologist Patient location: Pre-op. Preanesthetic checklist: patient identified, IV checked, site marked, risks and benefits discussed, surgical consent, monitors and equipment checked, pre-op evaluation, timeout performed and anesthesia consent Landmarks identified PA cath was placed.Swan type and PA catheter depth:thermodilation and 48PA Cath depth:48 Procedure performed using ultrasound guided technique. Attempts: 1 Patient tolerated the procedure well with no immediate complications.    Procedure Name: Intubation Date/Time: 12/08/2015 8:55 AM Performed by: Lance Coon Pre-anesthesia Checklist: Patient identified, Timeout performed, Emergency Drugs available, Suction available and Patient being monitored Patient Re-evaluated:Patient Re-evaluated prior to inductionOxygen Delivery Method: Circle system utilized Preoxygenation: Pre-oxygenation with 100% oxygen Intubation Type: IV induction Ventilation: Mask ventilation without difficulty and Oral airway inserted - appropriate to patient size Laryngoscope Size:  Sabra Heck and 2 Grade View: Grade I Tube type: Oral Tube size: 8.5 mm Number of attempts: 1 Airway Equipment and Method: Stylet Placement Confirmation: ETT inserted through vocal cords under direct vision,  positive ETCO2 and breath sounds checked- equal and bilateral Secured at: 22 cm Tube secured with: Tape Dental Injury: Teeth and Oropharynx as per pre-operative assessment

## 2015-12-08 NOTE — Brief Op Note (Signed)
12/05/2015 - 12/08/2015  12:29 PM  PATIENT:  Frederick Davis  82 y.o. male  PRE-OPERATIVE DIAGNOSIS:  CAD  POST-OPERATIVE DIAGNOSIS:  CAD  PROCEDURE:  Procedure(s) with comments:  CORONARY ARTERY BYPASS GRAFTING x 3 -LIMA to LAD -SVG to DIAGONAL -SVG to DISTAL RCA  ENDOSCOPIC HARVEST GREATER SAPHENOUS VEIN -Right and Left Thigh  TRANSESOPHAGEAL ECHOCARDIOGRAM (TEE) (N/A)  SURGEON:  Surgeon(s) and Role:    * Melrose Nakayama, MD - Primary  PHYSICIAN ASSISTANT: Ellwood Handler PA-C  ANESTHESIA:   general  EBL:  Total I/O In: -  Out: 500 [Urine:500]  BLOOD ADMINISTERED: CELLSAVER  DRAINS: Left Pleural Chest Tube, Mediastinal Chest Drains   LOCAL MEDICATIONS USED:  NONE  SPECIMEN:  No Specimen  DISPOSITION OF SPECIMEN:  N/A  COUNTS:  YES  TOURNIQUET:  * No tourniquets in log *  DICTATION: .Dragon Dictation  PLAN OF CARE: Admit to inpatient   PATIENT DISPOSITION:  ICU - intubated and hemodynamically stable.   Delay start of Pharmacological VTE agent (>24hrs) due to surgical blood loss or risk of bleeding: yes

## 2015-12-08 NOTE — H&P (View-Only) (Signed)
Reason for Consult:severe 2 vessel CAD with unstable angina Referring Physician: Dr. Angelena Form Hochrein  Frederick Davis is an 82 y.o. male.  HPI: Frederick Davis is a 82 yo man who presents with a cc/o CP.  He has an extensive medical history dating back many years. He has had rectal cancer and has a colostomy, prostate cancer, and renal cell cancer. He has a single kidney with stage III CKD. He had an MI in May 2014 and underwent a RCA thrombectomy. The MI was complicated by respiratory failure and he underwent systemic cooling. He had residual 80% mid LAD stenosis.   He has been having some chest pain off and on since his MI. Recently it has gotten worse. He was hospitalized in January of this year with CP. He ruled out for MI and was treated medically. He had a severe episode of CP in the early morning hours on Jan 28th. It awakened him from sleep. He finally took a NTG and the pain eased off. He did not seek further medical attention. He saw Dr. Percival Spanish on the 11/29/15. After hearing of this episode he recommended cardiac catheterization.   At cath today he had severe 2 vessel CAD involving the LAD, a diagonal branch and the RCA. The circumflex had some mild plaque but no flow limiting stenosis. EF 50-55% by echo. Echo also shows mild AS.  He lives with his wife. He remains active playing golf about 3 times a week. He has to take a cart because his left knee "gives out on me." He and his wife take care of all of their own needs.    Past Medical History  Diagnosis Date  . Hypothyroidism   . Hypertension   . Rectum cancer ([place]) 1986  . PC (prostate cancer) ([place]) 1987  . Renal cell cancer ([place])   . Osteoporosis   . Esophageal stricture   . Anemia   . CAD (coronary artery disease)     RCA throbectomy 2014  . Chronic renal insufficiency, stage III (moderate)   . Myocardial infarction ([place]) 03/12/13    Inferior MI.  LAD mid 80% stenosis, circ mild plaque, RCA 100% with thrombectomy PTCA.  EF  65%    Past Surgical History  Procedure Laterality Date  . Kidney surgery  2004  . Radiofrequency ablation kidney  2006  . Hernia repair      2007  . Renal sphincter appliance  2007  . Colonoscopy  01/08/2012    Procedure: COLONOSCOPY;  Surgeon: Rogene Houston, MD;  Location: [facility];  Service: Endoscopy;  Laterality: N/A;  1030  . Colostomy Left 1986    colon cancer  . Percutaneous coronary intervention-balloon only    . Left heart catheterization with coronary angiogram N/A 03/12/2013    Procedure: STEMI;  Surgeon: Burnell Blanks, MD;  Location: [facility] CATH LAB;  Service: Cardiovascular;  Laterality: N/A;  . Prostatectomy    . Right inguinal hernia repair    . Colonoscopy N/A 07/20/2015    Procedure: COLONOSCOPY;  Surgeon: Rogene Houston, MD;  Location: [facility];  Service: Endoscopy;  Laterality: N/A;  930 - moved to 9/29 @ 2:00  . Cardiac catheterization N/A 12/06/2015    Procedure: Left Heart Cath and Coronary Angiography;  Surgeon: Burnell Blanks, MD;  Location: [facility] CV LAB;  Service: Cardiovascular;  Laterality: N/A;    Family History  Problem Relation Age of Onset  . Stroke Mother 77  . AAA (abdominal aortic  aneurysm) Father 32    died from rupture at 55 yo    Social History:  reports that he quit smoking about 47 years ago. His smoking use included Cigarettes. He started smoking about 69 years ago. He smoked 0.50 packs per day. He has never used smokeless tobacco. He reports that he drinks about 0.5 oz of alcohol per week. He reports that he does not use illicit drugs.  Allergies:  Allergies  Allergen Reactions  . Lipitor [Atorvastatin] Other (See Comments)    Muscle aches    Medications:  Prior to Admission:  Prescriptions prior to admission  Medication Sig Dispense Refill Last Dose  . aspirin 81 MG tablet Take 1 tablet (81 mg total) by mouth daily. 30 tablet  Taking  . Bioflavonoid Products (BIOFLEX) TABS Take 1 tablet by mouth  daily.   Taking  . Cholecalciferol (VITAMIN D3) 2000 UNITS TABS Take 2,000 Units by mouth daily.   Taking  . ferrous gluconate (FERGON) 324 MG tablet Take 324 mg by mouth daily with breakfast.   Taking  . isosorbide mononitrate (IMDUR) 30 MG 24 hr tablet Take 1 tablet (30 mg total) by mouth daily. 30 tablet 11 Taking  . levothyroxine (SYNTHROID, LEVOTHROID) 75 MCG tablet TAKE 1 TABLET DAILY 90 tablet 2 Taking  . Multiple Vitamin (MULITIVITAMIN WITH MINERALS) TABS Take 1 tablet by mouth daily.   Taking  . nitroGLYCERIN (NITROSTAT) 0.4 MG SL tablet PLACE 1 TABLET UNDER THE TONGUE AT ONSET OF CHEST PAIN EVERY 5 MINTUES UP TO 3 TIMES AS NEEDED 25 tablet 11 Taking  . pantoprazole (PROTONIX) 40 MG tablet Take 1 tablet (40 mg total) by mouth daily. 30 tablet 11 Taking    Results for orders placed or performed during the hospital encounter of 12/05/15 (from the past 48 hour(s))  CBC     Status: Abnormal   Collection Time: 12/05/15  6:00 PM  Result Value Ref Range   WBC 8.8 4.0 - 10.5 K/uL   RBC 4.22 4.22 - 5.81 MIL/uL   Hemoglobin 11.9 (L) 13.0 - 17.0 g/dL   HCT 37.0 (L) 39.0 - 52.0 %   MCV 87.7 78.0 - 100.0 fL   MCH 28.2 26.0 - 34.0 pg   MCHC 32.2 30.0 - 36.0 g/dL   RDW 14.5 11.5 - 15.5 %   Platelets 204 150 - 400 K/uL  Creatinine, serum     Status: Abnormal   Collection Time: 12/05/15  6:00 PM  Result Value Ref Range   Creatinine, Ser 1.66 (H) 0.61 - 1.24 mg/dL   GFR calc non Af Amer 34 (L) >60 mL/min   GFR calc Af Amer 40 (L) >60 mL/min    Comment: (NOTE) The eGFR has been calculated using the CKD EPI equation. This calculation has not been validated in all clinical situations. eGFR's persistently <60 mL/min signify possible Chronic Kidney Disease.   Basic metabolic panel     Status: Abnormal   Collection Time: 12/06/15  2:30 AM  Result Value Ref Range   Sodium 139 135 - 145 mmol/L   Potassium 4.2 3.5 - 5.1 mmol/L   Chloride 109 101 - 111 mmol/L   CO2 21 (L) 22 - 32 mmol/L    Glucose, Bld 86 65 - 99 mg/dL   BUN 29 (H) 6 - 20 mg/dL   Creatinine, Ser 1.49 (H) 0.61 - 1.24 mg/dL   Calcium 9.2 8.9 - 10.3 mg/dL   GFR calc non Af Amer 39 (L) >60 mL/min   GFR  calc Af Amer 45 (L) >60 mL/min    Comment: (NOTE) The eGFR has been calculated using the CKD EPI equation. This calculation has not been validated in all clinical situations. eGFR's persistently <60 mL/min signify possible Chronic Kidney Disease.    Anion gap 9 5 - 15  Protime-INR     Status: None   Collection Time: 12/06/15  2:30 AM  Result Value Ref Range   Prothrombin Time 14.6 11.6 - 15.2 seconds   INR 1.12 0.00 - 1.49  CBC WITH DIFFERENTIAL     Status: Abnormal   Collection Time: 12/06/15  2:30 AM  Result Value Ref Range   WBC 6.4 4.0 - 10.5 K/uL   RBC 3.94 (L) 4.22 - 5.81 MIL/uL   Hemoglobin 10.7 (L) 13.0 - 17.0 g/dL   HCT 34.2 (L) 39.0 - 52.0 %   MCV 86.8 78.0 - 100.0 fL   MCH 27.2 26.0 - 34.0 pg   MCHC 31.3 30.0 - 36.0 g/dL   RDW 14.3 11.5 - 15.5 %   Platelets 177 150 - 400 K/uL   Neutrophils Relative % 63 %   Neutro Abs 4.1 1.7 - 7.7 K/uL   Lymphocytes Relative 22 %   Lymphs Abs 1.4 0.7 - 4.0 K/uL   Monocytes Relative 7 %   Monocytes Absolute 0.5 0.1 - 1.0 K/uL   Eosinophils Relative 7 %   Eosinophils Absolute 0.5 0.0 - 0.7 K/uL   Basophils Relative 1 %   Basophils Absolute 0.0 0.0 - 0.1 K/uL  POCT Activated clotting time     Status: None   Collection Time: 12/06/15 12:15 PM  Result Value Ref Range   Activated Clotting Time 178 seconds    No results found.  Review of Systems  Constitutional: Positive for malaise/fatigue. Negative for fever and chills.  Respiratory: Positive for shortness of breath. Negative for wheezing.   Cardiovascular: Positive for chest pain. Negative for orthopnea, claudication and leg swelling.  Gastrointestinal: Negative for nausea, vomiting and blood in stool.       Colostomy  Genitourinary: Positive for frequency. Negative for dysuria and hematuria.   Neurological: Negative for dizziness, speech change, focal weakness, loss of consciousness and weakness.  All other systems reviewed and are negative.  Blood pressure 152/98, pulse 78, temperature 98.1 F (36.7 C), temperature source Oral, resp. rate 9, weight 170 lb 3.1 oz (77.2 kg), SpO2 98 %. Physical Exam  Vitals reviewed. Constitutional: He is oriented to person, place, and time. He appears well-developed and well-nourished. No distress.  HENT:  Head: Normocephalic and atraumatic.  Eyes: Conjunctivae and EOM are normal. No scleral icterus.  Neck: Neck supple. No thyromegaly present.  No bruits  Cardiovascular: Normal rate, regular rhythm and intact distal pulses.   Murmur (2/6 systolic) heard. Respiratory: Effort normal and breath sounds normal. He has no wheezes. He has no rales.  GI: Soft. He exhibits no distension. There is no tenderness.  Ostomy bag in place  Musculoskeletal: He exhibits no edema.  Lymphadenopathy:    He has no cervical adenopathy.  Neurological: He is alert and oriented to person, place, and time. No cranial nerve deficit.  No focal motor deficit  Skin: Skin is warm and dry.   ECHOCARDIOGRAM Study Conclusions  - Left ventricle: EF hard to judge due to freqent ventricular ectopy. Wall thickness was increased in a pattern of mild LVH. Systolic function was normal. The estimated ejection fraction was in the range of 50% to 55%. - Aortic valve: There was  mild stenosis. Valve area (VTI): 2.01 cm^2. Valve area (Vmax): 1.77 cm^2. Valve area (Vmean): 1.57 cm^2. - Mitral valve: Calcified annulus. There was mild regurgitation. - Left atrium: The atrium was mildly dilated. - Right atrium: The atrium was mildly dilated. - Atrial septum: No defect or patent foramen ovale was identified.  CARDIAC CATHETERIZATION Conclusion    1. Severe triple vessel CAD 2. The entire proximal and mid LAD is severely calcified. The proximal LAD has a 95% stenosis  just before the takeoff of the large caliber diagonal branch. The Diagonal branch has proximal serial 99% stenoses.  3. The Circumflex has mild non-obstructive disease.  4. The RCA is a large dominant vessel with heavy calcification in the proximal and mid vessel. The proximal vessel has a discreet 95% stenosis.   Recommendations: He is a very functional 82 yo male. He is active and plays golf, shops and drives. He has severe three vessel CAD with heavy calcification in all vessels making PCI high risk. Echo is pending today but he is known to have normal LV systolic function. I will ask CT surgery to see him today to discuss CABG. Despite his advanced age I think he could be a CABG candidate. If he is not felt to be a candidate for CABG, will have to plan complex PCI of the LAD and RCA with rotablator atherectomy. I would also attempt PCI of the Diagonal branch. Continue ASA, Imdur. Consider beta blocker.    I personally reviewed the echo and cath films and concur with the findings as noted above  Assessment/Plan: 82 yo man who presents with unstable angina and has been found to have severe 2 vessel CAD with preserved LV function. His anatomy is not favorable for PCI due to heavily calcified vessels. He has good targets for bypass grafting.  He is a high risk candidate for CABG given advanced age and renal issues, but his risk is not prohibitive.  I have discussed the possibility of CABG with Frederick and Mrs Woolbright. I explained the general nature of the procedure, the need for general anesthesia, the use of cardiopulmonary bypass, and the incisions to be used. We discussed the expected hospital stay, overall recovery and short and long term outcomes. I reviewed the indications, risks, benefits and alternatives. They understand the risks include, but are not limited to death, stroke, MI, DVT/PE, bleeding, possible need for transfusion, infections, cardiac arrhythmias, and other organ system dysfunction  including respiratory, renal, or GI complications. He is at high risk for renal failure due to pre-existing condition and for stroke given advance age.  He accepts the risks and agrees to proceed  He needs carotid duplex.  Tentatively plan CABG Friday 2/17.  Melrose Nakayama 12/06/2015, 5:09 PM

## 2015-12-08 NOTE — Care Management Note (Signed)
Case Management Note  Patient Details  Name: Frederick Davis MRN: [id number] Date of Birth: 01-25-1923  Subjective/Objective:         Pt is s/p CABG           Action/Plan:  Pt has wife listed on demographic sheet, however family had left hospital and was unavailable via phone.  CM will continue to follow   Expected Discharge Date:                  Expected Discharge Plan:  [place]  In-House Referral:     Discharge planning Services  CM Consult  Post Acute Care Choice:    Choice offered to:     DME Arranged:    DME Agency:     HH Arranged:    [place] Agency:     Status of Service:  In process, will continue to follow  Medicare Important Message Given:    Date Medicare IM Given:    Medicare IM give by:    Date Additional Medicare IM Given:    Additional Medicare Important Message give by:     If discussed at [place] of Stay Meetings, dates discussed:    Additional Comments:  Maryclare Labrador, RN 12/08/2015, 3:59 PM

## 2015-12-08 NOTE — Transfer of Care (Signed)
Immediate Anesthesia Transfer of Care Note  Patient: Brodee Mcgillivary [facility]  Procedure(s) Performed: Procedure(s) with comments: CORONARY ARTERY BYPASS GRAFTING (CABG) (N/A) - Times 3 using left internal mammary artery and endoscopycally harvested bilateral saphenous vein. TRANSESOPHAGEAL ECHOCARDIOGRAM (TEE) (N/A)  Patient Location: PACU and SICU  Anesthesia Type:General  Level of Consciousness: sedated and unresponsive  Airway & Oxygen Therapy: Patient remains intubated per anesthesia plan and Patient placed on Ventilator (see vital sign flow sheet for setting)  Post-op Assessment: Report given to RN and Post -op Vital signs reviewed and stable  Post vital signs: Reviewed and stable  Last Vitals:  Filed Vitals:   12/07/15 2038 12/08/15 0426  BP: 118/78 146/87  Pulse: 63 73  Temp: 36.6 C 36.6 C  Resp: 18 18    Complications: No apparent anesthesia complications

## 2015-12-08 NOTE — Interval H&P Note (Signed)
History and Physical Interval Note:  12/08/2015 8:01 AM  Frederick Davis  has presented today for surgery, with the diagnosis of CAD  The various methods of treatment have been discussed with the patient and family. After consideration of risks, benefits and other options for treatment, the patient has consented to  Procedure(s): CORONARY ARTERY BYPASS GRAFTING (CABG) (N/A) TRANSESOPHAGEAL ECHOCARDIOGRAM (TEE) (N/A) as a surgical intervention .  The patient's history has been reviewed, patient examined, no change in status, stable for surgery.  I have reviewed the patient's chart and labs.  Questions were answered to the patient's satisfaction.     Melrose Nakayama

## 2015-12-08 NOTE — Progress Notes (Signed)
CT surgery p.m. Rounds  Multivessel CABG earlier today Stable hemodynamics Minimal chest tube drainage Patient recently extubated doing well

## 2015-12-08 NOTE — Op Note (Signed)
NAME:  Frederick Davis, Frederick Davis NO.:  [id number]  MEDICAL RECORD NO.:  [id number]  LOCATION:  2S08C                        FACILITY:  [facility]  PHYSICIAN:  Revonda Standard. Roxan Hockey, M.D.DATE OF BIRTH:  1922-12-14  DATE OF PROCEDURE:  12/08/2015 DATE OF DISCHARGE:                              OPERATIVE REPORT   PREOPERATIVE DIAGNOSIS:  Severe two-vessel coronary artery disease with unstable angina.  POSTOPERATIVE DIAGNOSIS:  Severe two-vessel coronary artery disease with unstable angina.  PROCEDURE:   Median sternotomy, extracorporeal circulation Coronary artery bypass grafting x 3  Left internal mammary artery to left anterior descending  Saphenous vein graft to first diagonal  Saphenous vein graft to distal right coronary Endoscopic vein harvest both thighs.  SURGEON:  Revonda Standard. Roxan Hockey, [name]  ASSISTANT:  Ellwood Handler, PA.  ANESTHESIA:  General.  FINDINGS:  Lower thigh vein in both legs unusable.   Upper thigh vein fair quality.   Mammary good quality.   LAD intramyocardial at the site of anastomosis.   Diagonal and distal right- good quality targets.  CLINICAL NOTE:  Mr. Faye is a 82 year old man who presented with unstable angina.  At catheterization, he had severe two-vessel disease with preserved left ventricular function.  His anatomy was not favorable for percutaneous intervention and he was referred for coronary artery bypass grafting.  The indications, risks, benefits, and alternatives were discussed in detail with the patient.  He understood and accepted the risks and agreed to proceed.  He did understand that this was a high- risk procedure given his advanced age and renal issues.  OPERATIVE NOTE:  Mr. Lineback was brought to the preoperative holding area on December 08, 2015.  Anesthesia placed a Swan-Ganz catheter and an arterial blood pressure monitoring line.  He was taken to the operating room, anesthetized, and intubated.  A Foley  catheter was placed. Intravenous antibiotics were administered.  Transesophageal echocardiography was performed.  It showed preserved left ventricular function.  There was mild aortic stenosis.  The chest, abdomen, and legs were prepped and draped in the usual sterile fashion.  An incision was made in the medial aspect of the right leg at the level of the knee.  The greater saphenous vein was harvested from the right thigh endoscopically.  Simultaneously, a median sternotomy was performed and the left internal mammary artery was harvested using standard technique.  2000 units of heparin was administered during the vessel harvest.  After removing the vein from the right thigh, it was apparent that the lower thigh portion was not usable.  The upper thigh did appear usable with a length sufficient to graft to the diagonal.  Additional vein was harvested from the left thigh endoscopically and there was again a very small vein in the lower thigh but the upper thigh was large caliber, fair quality, and suitable in length for the distal right coronary.  After harvesting the conduits, a sternal retractor was placed.  The pericardium was opened.  The remainder of the full heparin dose was given.  The ascending aorta was inspected.  There was no evidence of atherosclerotic disease, consistent with the findings on the transesophageal echocardiogram. After confirming adequate anticoagulation with ACT measurement, the aorta was cannulated  via concentric 2-0 Ethibond pledgeted pursestring sutures.  A dual-stage venous cannula was placed via a pursestring suture in the right atrial appendage.  Cardiopulmonary bypass was instituted.  Flows were maintained per protocol.  The patient was cooled to 34 degrees Celsius. The coronary arteries were inspected and anastomotic sites were chosen. The conduits were inspected and prepared for grafting.  The left mammary was not of sufficient length to reach the  epicardial portion of the LAD distally and the decision was made to graft the LAD more proximally where it was intramyocardial.  The LAD was not dissected out at this point.  A foam pad was placed in the pericardium to insulate the heart. A temperature probe was placed in myocardial septum and a cardioplegia cannula was placed in the ascending aorta.  The aorta was crossclamped.  The left ventricle was emptied via aortic root vent.  Cardiac arrest then was achieved with a combination of cold antegrade blood cardioplegia and topical iced saline.  1 L of cardioplegia was administered.  There was septal cooling to 9 degrees Celsius and a good diastolic arrest.  The following distal anastomoses were performed.  A reversed saphenous vein graft was placed end-to-side to the distal right coronary.  This was a 2-mm good quality target.  There was some plaque at the origin of the posterior descending.  This was calcified but a 1.5-mm probe did pass easily into the posterior descending as well as into the right coronary beyond its takeoff.  The vein was beveled and was anastomosed end-to-side with a running 7-0 Prolene suture.  All anastomoses were probed proximally and distally at their completion. Cardioplegia was administered down the vein graft, and there was good flow and good hemostasis.  A reversed saphenous vein graft was placed end-to-side to the first diagonal branch of the LAD.  This was a 1.5 mm good quality target vessel.  The vein to the diagonal was smaller than the vein that was used to the distal right. It was a fair quality and was anastomosed end- to-side with a running 7-0 Prolene suture.  Again, a probe passed easily proximally and distally and there was good flow with cardioplegia administration.  Additional cardioplegia then was administered down the aortic root.  The left internal mammary artery was brought through a window in the pericardium.  The distal end was  beveled.  It was anastomosed end-to- side to the LAD after dissecting out the LAD in its intramyocardial location.  The LAD was a 1.5-mm good quality target. The mammary was a 1.5-mm good quality conduit.  The anastomosis was performed with a running 8-0 Prolene suture.  At the completion of the mammary to LAD anastomosis, the bulldog clamp was briefly removed to inspect for hemostasis.  Rapid septal rewarming was noted.  The bulldog clamp was replaced and the mammary pedicle was tacked to the epicardial surface of the heart with 6-0 Prolene sutures.  Additional cardioplegia was administered.  The cardioplegia cannula then was removed in the ascending aorta.  The vein grafts were cut to length. The proximal vein graft anastomoses were performed to 4.5-mm punch aortotomies with running 6-0 Prolene sutures.  At the completion of the final proximal anastomosis, the patient was placed in Trendelenburg position.  Lidocaine was administered.  The bulldog clamp was again removed from the mammary artery.  The heart and aortic root were de- aired and the aortic crossclamp was removed.  Total crossclamp time was 75 minutes.  The patient spontaneously resumed a  sinus bradycardia.  He did not require defibrillation.  While rewarming was completed, all proximal and distal anastomoses were inspected for hemostasis.  Epicardial pacing wires were placed on the right ventricle and right atrium and DDD pacing was initiated for rate. When the core temperature reached 37 degrees Celsius, the patient was weaned from cardiopulmonary bypass on the first attempt without difficulty.  The total bypass time was 106 minutes. Postbypass transesophageal echocardiography was essentially unchanged from the prebypass study.  A test dose of protamine was administered and was well tolerated.  The atrial and aortic cannulae were removed.  The remainder of the protamine was administered without incident.  The chest  was irrigated with warm saline.  Hemostasis was achieved.  Left pleural and mediastinal chest tubes were placed through separate subcostal incisions, the pericardium was not closed.  The sternum was closed with a combination of single and double heavy gauge stainless steel wires. The pectoralis fascia, subcutaneous tissue, and skin were closed in standard fashion.  All sponge, needle, and instrument counts were correct at the end of the procedure.  The patient was taken from the operating room to the Surgical Intensive Care Unit intubated and in fair condition.     Revonda Standard Roxan Hockey, [name]     SCH/MEDQ  D:  12/08/2015  T:  12/08/2015  Job:  [id number]

## 2015-12-08 NOTE — Progress Notes (Signed)
Pt extubated to 4 L [place] per rapid wean protocol. NIF -19, VC 1.2L. Positive cuff leak. Incentive Instructed and pt pulled 1254ml x's 5. No distress noted. Will continue to monitor   12/08/15 1800  Oxygen Therapy/Pulse Ox  O2 Device Nasal Cannula  O2 Therapy Oxygen  O2 Flow Rate (L/min) 4 L/min  SpO2 100 %

## 2015-12-08 NOTE — OR Nursing (Signed)
Forty-five minute call to SICU charge nurse at 1316.

## 2015-12-09 ENCOUNTER — Inpatient Hospital Stay (HOSPITAL_COMMUNITY): Payer: Medicare Other

## 2015-12-09 LAB — POCT I-STAT, CHEM 8
BUN: 23 mg/dL — ABNORMAL HIGH (ref 6–20)
BUN: 23 mg/dL — ABNORMAL HIGH (ref 6–20)
CALCIUM ION: 0.71 mmol/L — AB (ref 1.13–1.30)
CALCIUM ION: 1.27 mmol/L (ref 1.13–1.30)
CHLORIDE: 104 mmol/L (ref 101–111)
Chloride: 105 mmol/L (ref 101–111)
Creatinine, Ser: 1.5 mg/dL — ABNORMAL HIGH (ref 0.61–1.24)
Creatinine, Ser: 1.5 mg/dL — ABNORMAL HIGH (ref 0.61–1.24)
GLUCOSE: 118 mg/dL — AB (ref 65–99)
Glucose, Bld: 116 mg/dL — ABNORMAL HIGH (ref 65–99)
HEMATOCRIT: 31 % — AB (ref 39.0–52.0)
HEMATOCRIT: 28 % — AB (ref 39.0–52.0)
HEMOGLOBIN: 10.5 g/dL — AB (ref 13.0–17.0)
HEMOGLOBIN: 9.5 g/dL — AB (ref 13.0–17.0)
Potassium: 6.6 mmol/L (ref 3.5–5.1)
Potassium: 4.2 mmol/L (ref 3.5–5.1)
SODIUM: 140 mmol/L (ref 135–145)
SODIUM: 140 mmol/L (ref 135–145)
TCO2: 22 mmol/L (ref 0–100)
TCO2: 23 mmol/L (ref 0–100)

## 2015-12-09 LAB — CBC
HCT: 25.9 % — ABNORMAL LOW (ref 39.0–52.0)
HCT: 28.9 % — ABNORMAL LOW (ref 39.0–52.0)
HEMOGLOBIN: 8.9 g/dL — AB (ref 13.0–17.0)
Hemoglobin: 8.2 g/dL — ABNORMAL LOW (ref 13.0–17.0)
MCH: 27.5 pg (ref 26.0–34.0)
MCH: 27.2 pg (ref 26.0–34.0)
MCHC: 31.7 g/dL (ref 30.0–36.0)
MCHC: 30.8 g/dL (ref 30.0–36.0)
MCV: 86.9 fL (ref 78.0–100.0)
MCV: 88.4 fL (ref 78.0–100.0)
PLATELETS: 139 10*3/uL — AB (ref 150–400)
PLATELETS: 141 10*3/uL — AB (ref 150–400)
RBC: 2.98 MIL/uL — ABNORMAL LOW (ref 4.22–5.81)
RBC: 3.27 MIL/uL — AB (ref 4.22–5.81)
RDW: 14.5 % (ref 11.5–15.5)
RDW: 14.8 % (ref 11.5–15.5)
WBC: 11.5 10*3/uL — ABNORMAL HIGH (ref 4.0–10.5)
WBC: 12.5 10*3/uL — AB (ref 4.0–10.5)

## 2015-12-09 LAB — BASIC METABOLIC PANEL
Anion gap: 7 (ref 5–15)
BUN: 19 mg/dL (ref 6–20)
CO2: 23 mmol/L (ref 22–32)
Calcium: 8.8 mg/dL — ABNORMAL LOW (ref 8.9–10.3)
Chloride: 110 mmol/L (ref 101–111)
Creatinine, Ser: 1.47 mg/dL — ABNORMAL HIGH (ref 0.61–1.24)
GFR calc Af Amer: 46 mL/min — ABNORMAL LOW (ref >60)
GFR calc non Af Amer: 40 mL/min — ABNORMAL LOW (ref >60)
GLUCOSE: 134 mg/dL — AB (ref 65–99)
POTASSIUM: 4.4 mmol/L (ref 3.5–5.1)
SODIUM: 140 mmol/L (ref 135–145)

## 2015-12-09 LAB — MAGNESIUM
MAGNESIUM: 2 mg/dL (ref 1.7–2.4)
MAGNESIUM: 0.8 mg/dL — AB (ref 1.7–2.4)

## 2015-12-09 LAB — GLUCOSE, CAPILLARY
GLUCOSE-CAPILLARY: 101 mg/dL — AB (ref 65–99)
GLUCOSE-CAPILLARY: 105 mg/dL — AB (ref 65–99)
GLUCOSE-CAPILLARY: 136 mg/dL — AB (ref 65–99)
Glucose-Capillary: 110 mg/dL — ABNORMAL HIGH (ref 65–99)
Glucose-Capillary: 109 mg/dL — ABNORMAL HIGH (ref 65–99)
Glucose-Capillary: 116 mg/dL — ABNORMAL HIGH (ref 65–99)

## 2015-12-09 LAB — CREATININE, SERUM
CREATININE: 1.65 mg/dL — AB (ref 0.61–1.24)
GFR, EST AFRICAN AMERICAN: 40 mL/min — AB (ref >60)
GFR, EST NON AFRICAN AMERICAN: 35 mL/min — AB (ref >60)

## 2015-12-09 LAB — HEMOGLOBIN A1C
Hgb A1c MFr Bld: 5.7 % — ABNORMAL HIGH (ref 4.8–5.6)
Mean Plasma Glucose: 117 mg/dL

## 2015-12-09 MED ORDER — INSULIN ASPART 100 UNIT/ML ~~LOC~~ SOLN
0.0000 [IU] | SUBCUTANEOUS | Status: DC
  Administered 2015-12-10: 4 [IU] via SUBCUTANEOUS

## 2015-12-09 MED ORDER — MAGNESIUM SULFATE 4 GM/100ML IV SOLN
4.0000 g | Freq: Once | INTRAVENOUS | Status: AC
Start: 2015-12-09 — End: 2015-12-09
  Administered 2015-12-09: 4 g via INTRAVENOUS

## 2015-12-09 MED ORDER — MAGNESIUM SULFATE 4 GM/100ML IV SOLN
INTRAVENOUS | Status: AC
  Filled 2015-12-09: qty 100

## 2015-12-09 MED ORDER — FUROSEMIDE 10 MG/ML IJ SOLN
20.0000 mg | Freq: Once | INTRAMUSCULAR | Status: AC
  Administered 2015-12-09: 20 mg via INTRAVENOUS
  Filled 2015-12-09: qty 2

## 2015-12-09 NOTE — Progress Notes (Addendum)
CRITICAL VALUE ALERT  Critical value received:  Magnesium 0.8  Date of notification:  12/09/2015  Time of notification:  1841  Critical value read back:Yes.    Nurse who received alert:  Achille Rich, RN  MD notified (1st page):  Prescott Gum  Time of first page:  1852  MD notified (2nd page):  Time of second page:  Responding MD:  Prescott Gum  Time MD responded:  [phone]

## 2015-12-09 NOTE — Progress Notes (Addendum)
1 Day Post-Op Procedure(s) (LRB): CORONARY ARTERY BYPASS GRAFTING (CABG) (N/A) TRANSESOPHAGEAL ECHOCARDIOGRAM (TEE) (N/A) Subjective: The patient is done well after multivessel CABG He is off pressors and out of bed to chair His creatinine has increased mildly but urine output is satisfactory P.m. labs are reviewed and are satisfactory Maintaining a paced rhythm Chest x-ray clear  Objective: Vital signs in last 24 hours: Temp:  [97.3 F (36.3 C)-98.4 F (36.9 C)] 97.3 F (36.3 C) (02/18 1604) Pulse Rate:  [88-95] 95 (02/18 1900) Cardiac Rhythm:  [-] A-V Sequential paced (02/18 1600) Resp:  [8-26] 15 (02/18 1900) BP: (82-129)/(53-85) 100/69 mmHg (02/18 1900) SpO2:  [93 %-100 %] 98 % (02/18 1900) Arterial Line BP: (80-138)/(48-74) 99/52 mmHg (02/18 0900) Weight:  [173 lb 11.6 oz (78.8 kg)] 173 lb 11.6 oz (78.8 kg) (02/18 0430)  Hemodynamic parameters for last 24 hours: PAP: (18-34)/(4-18) 18/4 mmHg CO:  [4.6 L/min-6.7 L/min] 4.6 L/min CI:  [2.5 L/min/m2-3.6 L/min/m2] 2.5 L/min/m2  Intake/Output from previous day: 02/17 0701 - 02/18 0700 In: 5285.2 [I.V.:3280.2; Blood:425; NG/GT:30; IV Piggyback:1550] Out: [id number] [Urine:1860; Blood:1200; Chest Tube:430] Intake/Output this shift:    Alert and appropriate Extremities warm Abdomen soft  Lab Results:  Recent Labs  12/09/15 0321 12/09/15 1700 12/09/15 1709 12/09/15 1731  WBC 11.5* 12.5*  --   --   HGB 8.2* 8.9* 10.5* 9.5*  HCT 25.9* 28.9* 31.0* 28.0*  PLT 139* 141*  --   --    BMET:  Recent Labs  12/07/15 0259  12/09/15 0321  12/09/15 1709 12/09/15 1731  NA 141  < > 140  --  140 140  K 4.5  < > 4.4  --  6.6* 4.2  CL 108  < > 110  --  104 105  CO2 23  --  23  --   --   --   GLUCOSE 95  < > 134*  --  118* 116*  BUN 24*  < > 19  --  23* 23*  CREATININE 1.57*  < > 1.47*  < > 1.50* 1.50*  CALCIUM 9.7  --  8.8*  --   --   --   < > = values in this interval not displayed.  PT/INR:  Recent Labs  12/08/15 1435   LABPROT 17.6*  INR 1.44   ABG    Component Value Date/Time   PHART 7.287* 12/08/2015 1907   HCO3 21.6 12/08/2015 1907   TCO2 23 12/09/2015 1731   ACIDBASEDEF 5.0* 12/08/2015 1907   O2SAT 99.0 12/08/2015 1907   CBG (last 3)   Recent Labs  12/09/15 0332 12/09/15 0835 12/09/15 1603  GLUCAP 110* 109* 105*    Assessment/Plan: S/P Procedure(s) (LRB): CORONARY ARTERY BYPASS GRAFTING (CABG) (N/A) TRANSESOPHAGEAL ECHOCARDIOGRAM (TEE) (N/A) Mobilize Diuresis Diabetes control d/c tubes/lines   LOS: 4 days    Frederick Davis 12/09/2015

## 2015-12-10 ENCOUNTER — Inpatient Hospital Stay (HOSPITAL_COMMUNITY): Payer: Medicare Other

## 2015-12-10 LAB — GLUCOSE, CAPILLARY
GLUCOSE-CAPILLARY: 105 mg/dL — AB (ref 65–99)
GLUCOSE-CAPILLARY: 90 mg/dL (ref 65–99)
GLUCOSE-CAPILLARY: 90 mg/dL (ref 65–99)
Glucose-Capillary: 90 mg/dL (ref 65–99)
Glucose-Capillary: 135 mg/dL — ABNORMAL HIGH (ref 65–99)
Glucose-Capillary: 162 mg/dL — ABNORMAL HIGH (ref 65–99)

## 2015-12-10 LAB — CBC
HCT: 27.2 % — ABNORMAL LOW (ref 39.0–52.0)
Hemoglobin: 8.7 g/dL — ABNORMAL LOW (ref 13.0–17.0)
MCH: 28.6 pg (ref 26.0–34.0)
MCHC: 32 g/dL (ref 30.0–36.0)
MCV: 89.5 fL (ref 78.0–100.0)
Platelets: 126 10*3/uL — ABNORMAL LOW (ref 150–400)
RBC: 3.04 MIL/uL — ABNORMAL LOW (ref 4.22–5.81)
RDW: 14.9 % (ref 11.5–15.5)
WBC: 10.1 10*3/uL (ref 4.0–10.5)

## 2015-12-10 LAB — BASIC METABOLIC PANEL
Anion gap: 9 (ref 5–15)
BUN: 22 mg/dL — ABNORMAL HIGH (ref 6–20)
CO2: 26 mmol/L (ref 22–32)
Calcium: 9.1 mg/dL (ref 8.9–10.3)
Chloride: 107 mmol/L (ref 101–111)
Creatinine, Ser: 1.68 mg/dL — ABNORMAL HIGH (ref 0.61–1.24)
GFR calc Af Amer: 39 mL/min — ABNORMAL LOW (ref >60)
GFR calc non Af Amer: 34 mL/min — ABNORMAL LOW (ref >60)
Glucose, Bld: 97 mg/dL (ref 65–99)
Potassium: 4.3 mmol/L (ref 3.5–5.1)
Sodium: 142 mmol/L (ref 135–145)

## 2015-12-10 MED ORDER — FUROSEMIDE 10 MG/ML IJ SOLN
20.0000 mg | Freq: Two times a day (BID) | INTRAMUSCULAR | Status: DC
  Administered 2015-12-10 (×2): 20 mg via INTRAVENOUS
  Filled 2015-12-10: qty 2

## 2015-12-10 NOTE — Progress Notes (Signed)
2 Days Post-Op Procedure(s) (LRB): CORONARY ARTERY BYPASS GRAFTING (CABG) (N/A) TRANSESOPHAGEAL ECHOCARDIOGRAM (TEE) (N/A) Subjective: Native HR 84 sinus Cx clear Will DC PT today Will start walking in hall PT ordered Creat plateau ed 1.7- cont iv lasix 20 bid  Objective: Vital signs in last 24 hours: Temp:  [97.3 F (36.3 C)-98.1 F (36.7 C)] 97.7 F (36.5 C) (02/19 0855) Pulse Rate:  [88-95] 89 (02/19 0800) Cardiac Rhythm:  [-] A-V Sequential paced (02/19 0800) Resp:  [10-26] 13 (02/19 0800) BP: (82-129)/(59-101) 86/59 mmHg (02/19 0800) SpO2:  [94 %-100 %] 98 % (02/19 0800) Weight:  [177 lb 4 oz (80.4 kg)] 177 lb 4 oz (80.4 kg) (02/19 0500)  Hemodynamic parameters for last 24 hours:    Intake/Output from previous day: 02/18 0701 - 02/19 0700 In: 933.8 [P.O.:240; I.V.:493.8; IV Piggyback:200] Out: [id number] [Urine:1395; Chest Tube:140] Intake/Output this shift: Total I/O In: 20 [I.V.:20] Out: 70 [Urine:30; Chest Tube:40]       Exam    General- alert and comfortable   Lungs- clear without rales, wheezes   Cor- regular rate and rhythm, no murmur , gallop   Abdomen- soft, non-tender   Extremities - warm, non-tender, minimal edema   Neuro- oriented, appropriate, no focal weakness \  Lab Results:  Recent Labs  12/09/15 1700  12/09/15 1731 12/10/15 0406  WBC 12.5*  --   --  10.1  HGB 8.9*  < > 9.5* 8.7*  HCT 28.9*  < > 28.0* 27.2*  PLT 141*  --   --  126*  < > = values in this interval not displayed. BMET:  Recent Labs  12/09/15 0321  12/09/15 1731 12/10/15 0406  NA 140  < > 140 142  K 4.4  < > 4.2 4.3  CL 110  < > 105 107  CO2 23  --   --  26  GLUCOSE 134*  < > 116* 97  BUN 19  < > 23* 22*  CREATININE 1.47*  < > 1.50* 1.68*  CALCIUM 8.8*  --   --  9.1  < > = values in this interval not displayed.  PT/INR:  Recent Labs  12/08/15 1435  LABPROT 17.6*  INR 1.44   ABG    Component Value Date/Time   PHART 7.287* 12/08/2015 1907   HCO3 21.6  12/08/2015 1907   TCO2 23 12/09/2015 1731   ACIDBASEDEF 5.0* 12/08/2015 1907   O2SAT 99.0 12/08/2015 1907   CBG (last 3)   Recent Labs  12/09/15 2011 12/10/15 0008 12/10/15 0314  GLUCAP 116* 105* 90    Assessment/Plan: S/P Procedure(s) (LRB): CORONARY ARTERY BYPASS GRAFTING (CABG) (N/A) TRANSESOPHAGEAL ECHOCARDIOGRAM (TEE) (N/A) Mobilize Diuresis d/c tubes/lines PT consult   LOS: 5 days    Tharon Aquas Trigt III 12/10/2015

## 2015-12-10 NOTE — Progress Notes (Signed)
CT surgery p.m. Rounds  Patient name related hallway Maintaining sinus rhythm off temporary pacemaker today Adequate urine output Incisional pain slowly improving P.m. labs reviewed and are satisfactory DC chest tubes

## 2015-12-11 ENCOUNTER — Inpatient Hospital Stay (HOSPITAL_COMMUNITY): Payer: Medicare Other

## 2015-12-11 ENCOUNTER — Encounter (HOSPITAL_COMMUNITY): Payer: Self-pay | Admitting: Thoracic Surgery (Cardiothoracic Vascular Surgery)

## 2015-12-11 LAB — GLUCOSE, CAPILLARY
GLUCOSE-CAPILLARY: 95 mg/dL (ref 65–99)
GLUCOSE-CAPILLARY: 83 mg/dL (ref 65–99)
Glucose-Capillary: 118 mg/dL — ABNORMAL HIGH (ref 65–99)
Glucose-Capillary: 82 mg/dL (ref 65–99)
Glucose-Capillary: 87 mg/dL (ref 65–99)
Glucose-Capillary: 94 mg/dL (ref 65–99)

## 2015-12-11 LAB — TYPE AND SCREEN
ABO/RH(D): O POS
Antibody Screen: NEGATIVE
UNIT DIVISION: 0
UNIT DIVISION: 0
Unit division: 0
Unit division: 0

## 2015-12-11 LAB — BASIC METABOLIC PANEL
Anion gap: 10 (ref 5–15)
BUN: 28 mg/dL — ABNORMAL HIGH (ref 6–20)
CO2: 25 mmol/L (ref 22–32)
Calcium: 8.8 mg/dL — ABNORMAL LOW (ref 8.9–10.3)
Chloride: 103 mmol/L (ref 101–111)
Creatinine, Ser: 1.89 mg/dL — ABNORMAL HIGH (ref 0.61–1.24)
GFR calc Af Amer: 34 mL/min — ABNORMAL LOW (ref >60)
GFR calc non Af Amer: 29 mL/min — ABNORMAL LOW (ref >60)
Glucose, Bld: 97 mg/dL (ref 65–99)
Potassium: 3.5 mmol/L (ref 3.5–5.1)
Sodium: 138 mmol/L (ref 135–145)

## 2015-12-11 LAB — CBC
HCT: 26 % — ABNORMAL LOW (ref 39.0–52.0)
Hemoglobin: 8 g/dL — ABNORMAL LOW (ref 13.0–17.0)
MCH: 26.9 pg (ref 26.0–34.0)
MCHC: 30.8 g/dL (ref 30.0–36.0)
MCV: 87.5 fL (ref 78.0–100.0)
Platelets: 130 10*3/uL — ABNORMAL LOW (ref 150–400)
RBC: 2.97 MIL/uL — ABNORMAL LOW (ref 4.22–5.81)
RDW: 14.7 % (ref 11.5–15.5)
WBC: 6.9 10*3/uL (ref 4.0–10.5)

## 2015-12-11 MED ORDER — POTASSIUM CHLORIDE 10 MEQ/50ML IV SOLN
10.0000 meq | INTRAVENOUS | Status: AC
  Administered 2015-12-11 (×2): 10 meq via INTRAVENOUS
  Filled 2015-12-11 (×2): qty 50

## 2015-12-11 MED ORDER — SODIUM CHLORIDE 0.9 % IV SOLN
INTRAVENOUS | Status: DC
  Administered 2015-12-11: 250 mL via INTRAVENOUS

## 2015-12-11 MED ORDER — ENOXAPARIN SODIUM 30 MG/0.3ML ~~LOC~~ SOLN
30.0000 mg | SUBCUTANEOUS | Status: DC
  Administered 2015-12-11 – 2015-12-14 (×4): 30 mg via SUBCUTANEOUS
  Filled 2015-12-11 (×4): qty 0.3

## 2015-12-11 MED ORDER — INSULIN ASPART 100 UNIT/ML ~~LOC~~ SOLN: 0.0000 [IU] | Freq: Three times a day (TID) | SUBCUTANEOUS | Status: DC

## 2015-12-11 NOTE — Progress Notes (Signed)
      [address]       [place] [address]             [phone]      Up in chair  BP 109/70 mmHg  Pulse 98  Temp(Src) 98.1 F (36.7 C) (Oral)  Resp 20  Ht 5\' 6"  (1.676 m)  Wt 174 lb 13.2 oz (79.3 kg)  BMI 28.23 kg/m2  SpO2 95%   Intake/Output Summary (Last 24 hours) at 12/11/15 1806 Last data filed at 12/11/15 1600  Gross per 24 hour  Intake    535 ml  Output   1685 ml  Net  -1150 ml    Had a good day, ambulated 300'  Rontrell Moquin C. Roxan Hockey, MD [facility] [phone]

## 2015-12-11 NOTE — Care Management Important Message (Signed)
Important Message  Patient Details  Name: Frederick Davis MRN: [id number] Date of Birth: [birth year]   Medicare Important Message Given:  Yes    Loann Quill 12/11/2015, 11:00 AM

## 2015-12-11 NOTE — Progress Notes (Signed)
3 Days Post-Op Procedure(s) (LRB): CORONARY ARTERY BYPASS GRAFTING (CABG) (N/A) TRANSESOPHAGEAL ECHOCARDIOGRAM (TEE) (N/A) Subjective: Some nausea, " a little dizzy" when he got OOB  Objective: Vital signs in last 24 hours: Temp:  [97.5 F (36.4 C)-98.6 F (37 C)] 98.5 F (36.9 C) (02/20 0400) Pulse Rate:  [79-95] 91 (02/20 0700) Cardiac Rhythm:  [-] A-V Sequential paced (02/19 2000) Resp:  [12-24] 24 (02/20 0700) BP: (85-125)/(57-88) 85/64 mmHg (02/20 0700) SpO2:  [92 %-100 %] 100 % (02/20 0700) Weight:  [174 lb 13.2 oz (79.3 kg)] 174 lb 13.2 oz (79.3 kg) (02/20 0500)  Hemodynamic parameters for last 24 hours:    Intake/Output from previous day: 02/19 0701 - 02/20 0700 In: 605 [P.O.:125; I.V.:480] Out: 1985 [Urine:1915; Chest Tube:70] Intake/Output this shift:    General appearance: alert, cooperative and no distress Neurologic: intact Heart: regular rate and rhythm Lungs: diminished breath sounds left base Abdomen: normal findings: soft, non-tender  Lab Results:  Recent Labs  12/10/15 0406 12/11/15 0338  WBC 10.1 6.9  HGB 8.7* 8.0*  HCT 27.2* 26.0*  PLT 126* 130*   BMET:  Recent Labs  12/10/15 0406 12/11/15 0338  NA 142 138  K 4.3 3.5  CL 107 103  CO2 26 25  GLUCOSE 97 97  BUN 22* 28*  CREATININE 1.68* 1.89*  CALCIUM 9.1 8.8*    PT/INR:  Recent Labs  12/08/15 1435  LABPROT 17.6*  INR 1.44   ABG    Component Value Date/Time   PHART 7.287* 12/08/2015 1907   HCO3 21.6 12/08/2015 1907   TCO2 23 12/09/2015 1731   ACIDBASEDEF 5.0* 12/08/2015 1907   O2SAT 99.0 12/08/2015 1907   CBG (last 3)   Recent Labs  12/10/15 2004 12/11/15 0003 12/11/15 0353  GLUCAP 162* 82 94    Assessment/Plan: S/P Procedure(s) (LRB): CORONARY ARTERY BYPASS GRAFTING (CABG) (N/A) TRANSESOPHAGEAL ECHOCARDIOGRAM (TEE) (N/A) -  CV- in SR. BP low- has diuresed well over the last 24 hours, may be intravascularly dry  Will give 500 ml NS,   Stop beta blocker for  now due to hypotension  RESP_ LLL atelectasis, may have some left hemidiaphragm dysfunction  RENAL- creatinine up this Am- hold diuretics and give saline  ENDO_ CBG OK change to AC/HS  Deconditioning=- PT consult  Thrombocytopenia- stable   LOS: 6 days    Melrose Nakayama 12/11/2015

## 2015-12-11 NOTE — Evaluation (Signed)
Physical Therapy Evaluation Patient Details Name: Frederick Davis MRN: [id number] DOB: 10-23-1922 Today's Date: 12/11/2015   History of Present Illness  Patient is a 82 y/o male with hx of HTN, prostate ca, CAD, MI and CKD stage III, rectum ca presents s/p CABG x3.  Clinical Impression  Patient presents with functional limitations due to deficits listed in PT problem list (see below). Pt with pain, decreased strength, decreased balance, endurance and mobility s/p above surgery and sternal precautions. Tolerated ambulating 150' with Min A for balance/safety. Fatigues. Education re: sternal precautions, bracing with pillow during coughing, sneezing etc. Will follow acutely to maximize independence and mobility prior to return home.    Follow Up Recommendations Home health PT;Supervision/Assistance - 24 hour    Equipment Recommendations  None recommended by PT    Recommendations for Other Services OT consult     Precautions / Restrictions Precautions Precautions: Fall;Sternal Precaution Comments: soft BP Restrictions Weight Bearing Restrictions: No      Mobility  Bed Mobility Overal bed mobility: Needs Assistance;+ 2 for safety/equipment;+2 for physical assistance Bed Mobility: Sit to Sidelying         Sit to sidelying: Mod assist;+2 for physical assistance;+2 for safety/equipment General bed mobility comments: Cues for technique, assist with trunk and bringing BLEs into bed. Cues to hold pillow.  Transfers Overall transfer level: Needs assistance Equipment used: None Transfers: Sit to/from Stand Sit to Stand: Min assist;+2 physical assistance         General transfer comment: Cues for anterior weight shift and body momentum to stand from surface with assist to boost up. Stood from Landscape architect.   Ambulation/Gait Ambulation/Gait assistance: Min assist Ambulation Distance (Feet): 150 Feet Assistive device:  (pushed w/c) Gait Pattern/deviations: Step-through  pattern;Decreased stride length;Decreased stance time - left;Decreased step length - right;Trunk flexed Gait velocity: decreased   General Gait Details: Slow, unsteady gait at times. Pain through left knee initially with stiff-like gait pattern which improved with increased distance. HR 92-107 bpm.  Stairs            Wheelchair Mobility    Modified Rankin (Stroke Patients Only)       Balance Overall balance assessment: Needs assistance Sitting-balance support: Feet supported;No upper extremity supported Sitting balance-Leahy Scale: Fair     Standing balance support: During functional activity Standing balance-Leahy Scale: Poor Standing balance comment: Reliant on UE support for balance.                              Pertinent Vitals/Pain Pain Assessment: Faces Faces Pain Scale: Hurts a little bit Pain Location: incision Pain Descriptors / Indicators: Sore;Grimacing Pain Intervention(s): Monitored during session;Repositioned    Home Living Family/patient expects to be discharged to:: Private residence Living Arrangements: Spouse/significant other Available Help at Discharge: Family;Available 24 hours/day Type of Home: House Home Access: Stairs to enter Entrance Stairs-Rails: Left Entrance Stairs-Number of Steps: 4 Home Layout: One level;Laundry or work area in [employer] Equipment: Environmental consultant - 2 wheels;Cane - single point      Prior Function Level of Independence: Independent with assistive device(s)         Comments: Uses SPC as needed due to knee pain-chronic. Golfs 2-3 times/week.     Hand Dominance   Dominant Hand: Right    Extremity/Trunk Assessment   Upper Extremity Assessment: Defer to OT evaluation           Lower Extremity Assessment: Generalized weakness  Communication   Communication: No difficulties  Cognition Arousal/Alertness: Awake/alert Behavior During Therapy: WFL for tasks assessed/performed Overall  Cognitive Status: Within Functional Limits for tasks assessed                      General Comments General comments (skin integrity, edema, etc.): Wife present during session.Sitting BP 91/80, BP post ambulation 52/41 (not sure of accuracy) RN notified and will retake. Pt asymptomatic.    Exercises        Assessment/Plan    PT Assessment Patient needs continued PT services  PT Diagnosis Difficulty walking;Generalized weakness;Acute pain   PT Problem List Decreased strength;Pain;Cardiopulmonary status limiting activity;Decreased activity tolerance;Decreased balance;Decreased mobility;Decreased knowledge of precautions  PT Treatment Interventions Balance training;Functional mobility training;Therapeutic activities;Therapeutic exercise;Patient/family education;Stair training;Gait training;DME instruction   PT Goals (Current goals can be found in the Care Plan section) Acute Rehab PT Goals Patient Stated Goal: to return to golf, independence PT Goal Formulation: With patient Time For Goal Achievement: 12/25/15 Potential to Achieve Goals: Good    Frequency Min 3X/week   Barriers to discharge Inaccessible home environment 4 steps to enter home    Co-evaluation               End of Session Equipment Utilized During Treatment: Oxygen Activity Tolerance: Patient tolerated treatment well;Patient limited by fatigue Patient left: in bed;with call bell/phone within reach;with bed alarm set;with family/visitor present Nurse Communication: Mobility status         Time: 6213-0865 PT Time Calculation (min) (ACUTE ONLY): 33 min   Charges:   PT Evaluation $PT Eval Moderate Complexity: 1 Procedure PT Treatments $Gait Training: 8-22 mins   PT G Codes:        Coriana Angello A Anab Vivar 12/11/2015, 12:06 PM Mylo Red, PT, DPT [phone]

## 2015-12-12 ENCOUNTER — Inpatient Hospital Stay (HOSPITAL_COMMUNITY): Payer: Medicare Other

## 2015-12-12 LAB — CBC
HEMATOCRIT: 26.6 % — AB (ref 39.0–52.0)
Hemoglobin: 8.3 g/dL — ABNORMAL LOW (ref 13.0–17.0)
MCH: 27.4 pg (ref 26.0–34.0)
MCHC: 31.2 g/dL (ref 30.0–36.0)
MCV: 87.8 fL (ref 78.0–100.0)
PLATELETS: 126 10*3/uL — AB (ref 150–400)
RBC: 3.03 MIL/uL — ABNORMAL LOW (ref 4.22–5.81)
RDW: 14.3 % (ref 11.5–15.5)
WBC: 4.6 10*3/uL (ref 4.0–10.5)

## 2015-12-12 LAB — BASIC METABOLIC PANEL
Anion gap: 6 (ref 5–15)
BUN: 30 mg/dL — AB (ref 6–20)
CALCIUM: 9 mg/dL (ref 8.9–10.3)
CO2: 26 mmol/L (ref 22–32)
CREATININE: 1.76 mg/dL — AB (ref 0.61–1.24)
Chloride: 108 mmol/L (ref 101–111)
GFR calc Af Amer: 37 mL/min — ABNORMAL LOW (ref >60)
GFR, EST NON AFRICAN AMERICAN: 32 mL/min — AB (ref >60)
Glucose, Bld: 97 mg/dL (ref 65–99)
POTASSIUM: 4 mmol/L (ref 3.5–5.1)
SODIUM: 140 mmol/L (ref 135–145)

## 2015-12-12 LAB — GLUCOSE, CAPILLARY: GLUCOSE-CAPILLARY: 86 mg/dL (ref 65–99)

## 2015-12-12 MED ORDER — ACETAMINOPHEN 160 MG/5ML PO SOLN: 1000.0000 mg | Freq: Four times a day (QID) | ORAL | Status: DC | PRN

## 2015-12-12 MED ORDER — ACETAMINOPHEN 500 MG PO TABS
1000.0000 mg | ORAL_TABLET | Freq: Four times a day (QID) | ORAL | Status: DC | PRN
Start: 2015-12-12 — End: 2015-12-14

## 2015-12-12 MED ORDER — SODIUM CHLORIDE 0.9 % IV SOLN: 250.0000 mL | INTRAVENOUS | Status: DC | PRN

## 2015-12-12 MED ORDER — SODIUM CHLORIDE 0.9% FLUSH
3.0000 mL | Freq: Two times a day (BID) | INTRAVENOUS | Status: DC
  Administered 2015-12-12 – 2015-12-13 (×3): 3 mL via INTRAVENOUS

## 2015-12-12 MED ORDER — MOVING RIGHT ALONG BOOK
Freq: Once | Status: AC
  Administered 2015-12-12: 1
  Filled 2015-12-12: qty 1

## 2015-12-12 MED ORDER — ASPIRIN EC 81 MG PO TBEC
81.0000 mg | DELAYED_RELEASE_TABLET | Freq: Every day | ORAL | Status: DC
  Administered 2015-12-12 – 2015-12-14 (×3): 81 mg via ORAL
  Filled 2015-12-12 (×3): qty 1

## 2015-12-12 MED ORDER — ALUM & MAG HYDROXIDE-SIMETH 200-200-20 MG/5ML PO SUSP: 15.0000 mL | ORAL | Status: DC | PRN

## 2015-12-12 MED ORDER — ASPIRIN 81 MG PO CHEW: 81.0000 mg | CHEWABLE_TABLET | Freq: Every day | ORAL | Status: DC

## 2015-12-12 MED ORDER — SODIUM CHLORIDE 0.9% FLUSH: 3.0000 mL | INTRAVENOUS | Status: DC | PRN

## 2015-12-12 MED ORDER — MAGNESIUM HYDROXIDE 400 MG/5ML PO SUSP: 30.0000 mL | Freq: Every day | ORAL | Status: DC | PRN

## 2015-12-12 NOTE — Care Management Note (Signed)
Case Management Note  Patient Details  Name: Frederick Davis MRN: [id number] Date of Birth: [birth year]  Subjective/Objective:    Pt lives with spouse, has cane and walker that belonged to his sister.  PT recommends home therapy and pt agrees.  Provided list of [facility] home health agencies to spouse and made a referral to [facility] per choice.                            Expected Discharge Plan:  [place]  Discharge planning Services  CM Consult  Post Acute Care Choice:  Home Health Choice offered to:  Spouse  HH Arranged:  PT HH Agency:  [facility]  Status of Service:  Completed, signed off  Medicare Important Message Given:  Yes  Girard Cooter, RN 12/12/2015, 1:10 PM

## 2015-12-12 NOTE — Progress Notes (Signed)
4 Days Post-Op Procedure(s) (LRB): CORONARY ARTERY BYPASS GRAFTING (CABG) (N/A) TRANSESOPHAGEAL ECHOCARDIOGRAM (TEE) (N/A) Subjective: Feels a little better this AM Had some nausea last night. Refusing tylenol as he thinks it is the cause  Objective: Vital signs in last 24 hours: Temp:  [97.3 F (36.3 C)-98.9 F (37.2 C)] 97.3 F (36.3 C) (02/21 0401) Pulse Rate:  [82-99] 82 (02/21 0700) Cardiac Rhythm:  [-] Normal sinus rhythm (02/21 0430) Resp:  [10-30] 10 (02/21 0700) BP: (91-141)/(60-101) 102/76 mmHg (02/21 0700) SpO2:  [90 %-100 %] 99 % (02/21 0700) Weight:  [177 lb 0.5 oz (80.3 kg)] 177 lb 0.5 oz (80.3 kg) (02/21 0500)  Hemodynamic parameters for last 24 hours:    Intake/Output from previous day: 02/20 0701 - 02/21 0700 In: 610 [P.O.:460; I.V.:100; IV Piggyback:50] Out: 1105 [Urine:1105] Intake/Output this shift:    General appearance: alert, cooperative and no distress Neurologic: intact Heart: regular rate and rhythm Lungs: diminished breath sounds left base Abdomen: normal findings: soft, non-tender  Lab Results:  Recent Labs  12/11/15 0338 12/12/15 0359  WBC 6.9 4.6  HGB 8.0* 8.3*  HCT 26.0* 26.6*  PLT 130* 126*   BMET:  Recent Labs  12/11/15 0338 12/12/15 0359  NA 138 140  K 3.5 4.0  CL 103 108  CO2 25 26  GLUCOSE 97 97  BUN 28* 30*  CREATININE 1.89* 1.76*  CALCIUM 8.8* 9.0    PT/INR: No results for input(s): LABPROT, INR in the last 72 hours. ABG    Component Value Date/Time   PHART 7.287* 12/08/2015 1907   HCO3 21.6 12/08/2015 1907   TCO2 23 12/09/2015 1731   ACIDBASEDEF 5.0* 12/08/2015 1907   O2SAT 99.0 12/08/2015 1907   CBG (last 3)   Recent Labs  12/11/15 1213 12/11/15 1630 12/11/15 2159  GLUCAP 95 118* 83    Assessment/Plan: S/P Procedure(s) (LRB): CORONARY ARTERY BYPASS GRAFTING (CABG) (N/A) TRANSESOPHAGEAL ECHOCARDIOGRAM (TEE) (N/A) -  Cv- stable, maintaining SR  RESP- left lower lobe atelectasis, left  hemidiaphragm dysfunction + small left effusion  RENAL- creatinine improved this AM, will dc Foley  ENDO- CBG well controlled  Deconditioning- continue ambulation  transfer to 2 west   LOS: 7 days    Melrose Nakayama 12/12/2015

## 2015-12-12 NOTE — Progress Notes (Signed)
CARDIAC REHAB PHASE I   PRE:  Rate/Rhythm: 89 SR  BP:  Sitting: 107/73        SaO2: 97 2L  MODE:  Ambulation: 350 ft   POST:  Rate/Rhythm: 102 ST c/ occ PVC  BP:  Sitting: 124/72         SaO2: 98 2L  Pt up in recliner, required significant assistance to stand (assist x2), required verbal cues for sternal precautions. Once standing, pt fairly steady. Pt ambulated 350 ft on 2L O2, rolling walker, gait belt, assist x1, slow, fairly steady gait, tolerated well, pt denies any complaints, however, pt did c/o mild fatigue upon returning to room. Pt to recliner after walk, call bell within reach. Encouraged ambulation, IS. Will follow.     [place], RN, BSN 12/12/2015 2:20 PM

## 2015-12-12 NOTE — Progress Notes (Signed)
12/12/2015 1250 Received transfer into room 2W31 from 2S.  Pt is A&O.  No c/o voiced.  Tele monitor applied and CCMD notified.  Oriented pt to room, call light and bed.  Call bell in reach.  Family at bedside. [place]

## 2015-12-12 NOTE — Progress Notes (Signed)
Utilization review completed.  

## 2015-12-13 ENCOUNTER — Inpatient Hospital Stay (HOSPITAL_COMMUNITY): Payer: Medicare Other

## 2015-12-13 LAB — BASIC METABOLIC PANEL
ANION GAP: 6 (ref 5–15)
BUN: 30 mg/dL — ABNORMAL HIGH (ref 6–20)
CALCIUM: 9.2 mg/dL (ref 8.9–10.3)
CO2: 26 mmol/L (ref 22–32)
Chloride: 108 mmol/L (ref 101–111)
Creatinine, Ser: 1.77 mg/dL — ABNORMAL HIGH (ref 0.61–1.24)
GFR, EST AFRICAN AMERICAN: 37 mL/min — AB (ref >60)
GFR, EST NON AFRICAN AMERICAN: 32 mL/min — AB (ref >60)
GLUCOSE: 99 mg/dL (ref 65–99)
POTASSIUM: 4.1 mmol/L (ref 3.5–5.1)
Sodium: 140 mmol/L (ref 135–145)

## 2015-12-13 LAB — CBC
HEMATOCRIT: 27.5 % — AB (ref 39.0–52.0)
Hemoglobin: 8.5 g/dL — ABNORMAL LOW (ref 13.0–17.0)
MCH: 27 pg (ref 26.0–34.0)
MCHC: 30.9 g/dL (ref 30.0–36.0)
MCV: 87.3 fL (ref 78.0–100.0)
PLATELETS: 170 10*3/uL (ref 150–400)
RBC: 3.15 MIL/uL — AB (ref 4.22–5.81)
RDW: 14.5 % (ref 11.5–15.5)
WBC: 5.4 10*3/uL (ref 4.0–10.5)

## 2015-12-13 MED ORDER — FUROSEMIDE 40 MG PO TABS
40.0000 mg | ORAL_TABLET | Freq: Every day | ORAL | Status: DC
  Administered 2015-12-13 – 2015-12-14 (×2): 40 mg via ORAL
  Filled 2015-12-13 (×2): qty 1

## 2015-12-13 MED ORDER — DIPHENHYDRAMINE HCL 25 MG PO CAPS
25.0000 mg | ORAL_CAPSULE | Freq: Once | ORAL | Status: AC
  Administered 2015-12-14: 25 mg via ORAL
  Filled 2015-12-13: qty 1

## 2015-12-13 MED ORDER — POTASSIUM CHLORIDE CRYS ER 20 MEQ PO TBCR
20.0000 meq | EXTENDED_RELEASE_TABLET | Freq: Every day | ORAL | Status: DC
  Administered 2015-12-13 – 2015-12-14 (×2): 20 meq via ORAL
  Filled 2015-12-13 (×2): qty 1

## 2015-12-13 NOTE — Progress Notes (Signed)
Physical Therapy Treatment Patient Details Name: Frederick Davis MRN: [id number] DOB: 06/20/1923 Today's Date: 12/13/2015    History of Present Illness Patient is a 82 y/o male with hx of HTN, prostate ca, CAD, MI and CKD stage III, rectum ca presents s/p CABG x3.    PT Comments    Patient progressing well towards PT goals. Continues to require assist for bed mobility due to inability to use UEs. Adheres to sternal precautions throughout mobility. Able to stand without assist today. Improved ambulation distance. Will plan for stair training tomorrow to prepare pt for home. Will follow acutely.   Follow Up Recommendations  Home health PT;Supervision/Assistance - 24 hour     Equipment Recommendations  None recommended by PT    Recommendations for Other Services       Precautions / Restrictions Precautions Precautions: Fall;Sternal Restrictions Weight Bearing Restrictions: No    Mobility  Bed Mobility Overal bed mobility: Needs Assistance Bed Mobility: Supine to Sit;Sit to Sidelying     Supine to sit: Mod assist;HOB elevated   Sit to sidelying: Min guard General bed mobility comments: Cues for technique, assist with elevating trunk. ABle to get into bed without assist and increased time. Cues to hold pillow.  Transfers Overall transfer level: Needs assistance Equipment used: Rolling walker (2 wheeled) Transfers: Sit to/from Stand Sit to Stand: Min guard;From elevated surface         General transfer comment: Min guard for safety. Cues for anterior weight shift and momentum to stand. Using bed for support on LEs upon standing when surface is low.  Ambulation/Gait Ambulation/Gait assistance: Supervision Ambulation Distance (Feet): 350 Feet Assistive device: Rolling walker (2 wheeled) Gait Pattern/deviations: Step-through pattern;Decreased stride length;Decreased stance time - left;Decreased step length - right Gait velocity: decreased   General Gait Details:  Slow, mostly steady gait. 2/4 DOE. HR up to 114 bpm. No knee buckling.    Stairs            Wheelchair Mobility    Modified Rankin (Stroke Patients Only)       Balance Overall balance assessment: Needs assistance Sitting-balance support: Feet supported;No upper extremity supported Sitting balance-Leahy Scale: Good     Standing balance support: During functional activity Standing balance-Leahy Scale: Fair Standing balance comment: Able to stand for short periods unsupported without UEs.                    Cognition Arousal/Alertness: Awake/alert Behavior During Therapy: WFL for tasks assessed/performed Overall Cognitive Status: Within Functional Limits for tasks assessed                      Exercises Other Exercises Other Exercises: sit to stand x5 from different bed heights for strengthening    General Comments General comments (skin integrity, edema, etc.): Wife present during session.      Pertinent Vitals/Pain Pain Assessment: No/denies pain    Home Living                      Prior Function            PT Goals (current goals can now be found in the care plan section) Progress towards PT goals: Progressing toward goals    Frequency  Min 3X/week    PT Plan Current plan remains appropriate    Co-evaluation             End of Session Equipment Utilized During Treatment: Gait belt;Oxygen Activity Tolerance:  Patient tolerated treatment well Patient left: in bed;with call bell/phone within reach;with family/visitor present     Time: 0933-1009 PT Time Calculation (min) (ACUTE ONLY): 36 min  Charges:  $Gait Training: 8-22 mins $Therapeutic Activity: 8-22 mins                    G Codes:      Franca Stakes A Kimble Hitchens 12/13/2015, 10:17 AM Mylo Red, PT, DPT [phone]

## 2015-12-13 NOTE — Discharge Summary (Signed)
Physician Discharge Summary       [address]       [place] [address]             [phone]    Patient ID: SAVVA ADINOLFI MRN: [id number] DOB/AGE: 80-07-1923 82 y.o.  Admit date: 12/05/2015 Discharge date: 12/14/2015  Admission Diagnoses: 1. Unstable angina 2. Coronary artery disease  Active Diagnoses:  1. Essential hypertension, benign 2. CKD (chronic kidney disease) stage 3, GFR 30-59 ml/min 3. History of STEMI- May 2014 ( S/P RCA thrombectomy May 2014) 4. History of renal cell cancer-s/p nephrectomy ([place]) 5. Hyperlipidemia 6. Aortic stenosis, mild-2014 7. Hypothyroidism 8. Prostate cancer ([place]) 9. Remote tobacco abuse 10. Rectal cancer 11. ABL anemia  Procedure (s):  Left Heart Cath and Coronary Angiography by Dr. Angelena Form on 12/06/2015:    Conclusion    1. Severe triple vessel CAD 2. The entire proximal and mid LAD is severely calcified. The proximal LAD has a 95% stenosis just before the takeoff of the large caliber diagonal branch. The Diagonal branch has proximal serial 99% stenoses.  3. The Circumflex has mild non-obstructive disease.  4. The RCA is a large dominant vessel with heavy calcification in the proximal and mid vessel. The proximal vessel has a discreet 95% stenosis.   Recommendations: He is a very functional 82 yo male. He is active and plays golf, shops and drives. He has severe three vessel CAD with heavy calcification in all vessels making PCI high risk. Echo is pending today but he is known to have normal LV systolic function. I will ask CT surgery to see him today to discuss CABG. Despite his advanced age I think he could be a CABG candidate. If he is not felt to be a candidate for CABG, will have to plan complex PCI of the LAD and RCA with rotablator atherectomy. I would also attempt PCI of the Diagonal branch. Continue ASA, Imdur. Consider beta blocker.     Median sternotomy, extracorporeal circulation, coronary artery  bypass grafting x3 (left internal mammary artery to left anterior descending, saphenous vein graft to first diagonal, saphenous vein graft to distal right coronary), endoscopic vein harvest, both thighs by Dr. Roxan Hockey on 12/08/2015.  History of Presenting Illness: This is a 82 year old Caucasian male who has an extensive medical history dating back many years. He has had rectal cancer and has a colostomy, prostate cancer, and renal cell cancer. He has a single kidney with stage III CKD. He had an MI in May 2014 and underwent a RCA thrombectomy. The MI was complicated by respiratory failure and he underwent systemic cooling. He had residual 80% mid LAD stenosis.   He has been having some chest pain off and on since his MI. Recently, it has gotten worse. He was hospitalized in January of this year with chest pain. He ruled out for MI and was treated medically. He had a severe episode of chest pain in the early morning hours on Jan 28th. It awakened him from sleep. He finally took a NTG and the pain eased off. He did not seek further medical attention. He saw Dr. Percival Spanish on the 11/29/15. After hearing of this episode, he recommended cardiac catheterization.   At cath, he had severe 2 vessel CAD involving the LAD, a diagonal branch and the RCA. The circumflex had some mild plaque but no flow limiting stenosis. EF 50-55% by echo. Echo also shows mild AS.  He lives with his wife. He remains  active playing golf about 3 times a week. He has to take a cart because his left knee "gives out on me." He and his wife take care of all of their own needs.   Dr. Roxan Hockey discussed the need for coronary artery bypass grafting surgery. Potential risks, benefits, and complications were discussed with the patient and his wife. Pre operative duplex carotid US showed no significant internal carotid artery stenosis bilaterally. He agreed to proceed with surgery and underwent a CABG x 3 on 12/08/2015.   Brief Hospital  Course:  The patient was extubated the evening of surgery without difficulty. He remained afebrile and hemodynamically stable. He was initially AV paced. Gordy Councilman, a line, chest tubes, and foley were removed early in the post operative course. His blood pressure was somewhat labile initially so Lopressor was held.  He was volume over loaded and diuresed. He has CKD with a baseline creatinine around 1.6. Kidney function was monitored closely while being diuresed. He had ABL anemia. He did not require a post op transfusion. He was weaned off the insulin drip.  The patient's HGA1C pre op was 5.7 and he is likely pre diabetic. The patient was felt surgically stable for transfer from the ICU to PCTU for further convalescence on 12/12/2015. He continues to progress with cardiac rehab. He was requiring 2 liters of oxygen via [place]. We will attempt to wean him to room air. He has been tolerating a diet and has had a bowel movement. Epicardial pacing wires were removed on 02/22. Chest tube sutures will be removed prior to discharge. The patient is felt surgically stable for discharge today.   Latest Vital Signs: Blood pressure 118/68, pulse 92, temperature 98.4 F (36.9 C), temperature source Oral, resp. rate 18, height 5\' 6"  (1.676 m), weight 176 lb 12.9 oz (80.2 kg), SpO2 97 %.  Physical Exam: Cardiovascular: RRR Pulmonary: Diminished at bases L>R Abdomen: Soft, non tender, bowel sounds present. Extremities: Mild bilateral lower extremity edema. Wounds: Clean and dry. No erythema or signs of infection.  Discharge Condition:Stable and discharged to home  Recent laboratory studies:  Lab Results  Component Value Date   WBC 5.4 12/13/2015   HGB 8.5* 12/13/2015   HCT 27.5* 12/13/2015   MCV 87.3 12/13/2015   PLT 170 12/13/2015   Lab Results  Component Value Date   NA 142 12/14/2015   K 4.0 12/14/2015   CL 106 12/14/2015   CO2 25 12/14/2015   CREATININE 1.71* 12/14/2015   GLUCOSE 103* 12/14/2015     Diagnostic Studies: Dg Chest 2 View  12/13/2015  CLINICAL DATA:  Status post cardiac surgery EXAM: CHEST  2 VIEW COMPARISON:  12/12/2015 FINDINGS: Enlarged cardiac silhouette stable. Left lower lobe consolidation and small to moderate left effusion similar to prior study. Small right pleural effusion and mild atelectasis right base stable. No evidence of pulmonary edema. Right internal jugular central line has been removed. No pneumothorax identified. IMPRESSION: Bilateral lower lobe opacities suggesting atelectasis with small right and small to moderate left pleural effusions, unchanged Electronically Signed   By: Skipper Cliche [name]   On: 12/13/2015 08:04      Discharge Instructions    Amb Referral to Cardiac Rehabilitation    Complete by:  As directed   Diagnosis:  CABG          Discharge Medications:   Medication List    STOP taking these medications        isosorbide mononitrate 30 MG 24 hr tablet  Commonly known as:  IMDUR     nitroGLYCERIN 0.4 MG SL tablet  Commonly known as:  NITROSTAT      TAKE these medications        aspirin 81 MG tablet  Take 1 tablet (81 mg total) by mouth daily.     BIOFLEX Tabs  Take 1 tablet by mouth daily.     carvedilol 3.125 MG tablet  Commonly known as:  COREG  Take 1 tablet (3.125 mg total) by mouth 2 (two) times daily with a meal.     ferrous gluconate 324 MG tablet  Commonly known as:  FERGON  Take 324 mg by mouth daily with breakfast.     furosemide 40 MG tablet  Commonly known as:  LASIX  Take 1 tablet (40 mg total) by mouth daily. For 5 days     levothyroxine 75 MCG tablet  Commonly known as:  SYNTHROID, LEVOTHROID  TAKE 1 TABLET DAILY     multivitamin with minerals Tabs tablet  Take 1 tablet by mouth daily.     pantoprazole 40 MG tablet  Commonly known as:  PROTONIX  Take 1 tablet (40 mg total) by mouth daily.     potassium chloride SA 20 MEQ tablet  Commonly known as:  K-DUR,KLOR-CON  Take 1 tablet (20 mEq total)  by mouth daily. 5 Days     traMADol 50 MG tablet  Commonly known as:  ULTRAM  Take 1-2 tablets (50-100 mg total) by mouth every 4 (four) hours as needed for moderate pain.     Vitamin D3 2000 units Tabs  Take 2,000 Units by mouth daily.       The patient has been discharged on:   1.Beta Blocker:  Yes [   ]                              No   [ x  ]                              If No, reason: labile bp  2.Ace Inhibitor/ARB: Yes [   ]                                     No  [  x  ]                                     If No, reason:Labile blood pressure  3.Statin:   Yes [   ]                  No  [  x ]                  If No, reason:Allergy  4.Shela [place]:  Yes  [  x ]                  No   [   ]                  If No, reason: Follow Up Appointments: Follow-up Information    Follow up with Minus Breeding, MD On 01/10/2016.   Specialty:  Cardiology   Why:  Appointment time is at 10:15 am  Contact information:   [place] [address] [phone]       Follow up with Melrose Nakayama, MD On 01/16/2016.   Specialty:  Cardiothoracic Surgery   Why:  PA/LAT CXR to be taken (at [facility] which is in the same building as Dr. Leonarda Salon office) on 01/16/2016 at 10:45 am;Appointment time is at 11:30 am   Contact information:   [place] [address] [phone]       Follow up with Redge Gainer, MD.   Specialty:  Family Medicine   Why:  Call for a follow up appointment regarding further surveillance of HGA1C 5.7 (pre diabetes)    Contact information:   [place] [address] [phone]       Signed: Cinda Quest 12/14/2015, 2:07 PM

## 2015-12-13 NOTE — Progress Notes (Signed)
Patient ambulated 563ft with a walker. O2 was 94% on room air. He tolerated it well.

## 2015-12-13 NOTE — Progress Notes (Signed)
CARDIAC REHAB PHASE I   PRE:  Rate/Rhythm: 101 ST  BP:  Supine:   Sitting: 112/78  Standing:    SaO2: 99% 1L  MODE:  Ambulation: 450 ft   POST:  Rate/Rhythm: 114 ST PVCs  BP:  Supine: 122/70  Sitting:   Standing:    SaO2: 94%RA 1315-1345 Pt stood by himself by rocking and holding to pillow. Walked 450 ft on RA with rolling walker and minimal asst. Stated he has walker at home. Tolerated well on RA. To bed for pacing wire removal.   Graylon Good, RN BSN  12/13/2015 1:41 PM

## 2015-12-13 NOTE — Progress Notes (Addendum)
      [address]       [place] [address]             [phone]        5 Days Post-Op Procedure(s) (LRB): CORONARY ARTERY BYPASS GRAFTING (CABG) (N/A) TRANSESOPHAGEAL ECHOCARDIOGRAM (TEE) (N/A)  Subjective: Patient without specific complaints this am  Objective: Vital signs in last 24 hours: Temp:  [97.7 F (36.5 C)-98.5 F (36.9 C)] 98.1 F (36.7 C) (02/22 0444) Pulse Rate:  [80-101] 92 (02/22 0444) Cardiac Rhythm:  [-] Heart block (02/21 2024) Resp:  [13-23] 18 (02/22 0444) BP: (103-129)/(69-83) 116/74 mmHg (02/22 0444) SpO2:  [98 %-100 %] 98 % (02/22 0444) Weight:  [177 lb (80.287 kg)] 177 lb (80.287 kg) (02/22 0444)  Pre op weight 75 kg Current Weight  12/13/15 177 lb (80.287 kg)      Intake/Output from previous day: 02/21 0701 - 02/22 0700 In: 270 [P.O.:270] Out: 265 [Urine:265]   Physical Exam:  Cardiovascular: RRR Pulmonary: Diminished at bases L>R Abdomen: Soft, non tender, bowel sounds present. Extremities: Mild bilateral lower extremity edema. Wounds: Clean and dry.  No erythema or signs of infection.  Lab Results: CBC: Recent Labs  12/12/15 0359 12/13/15 0400  WBC 4.6 5.4  HGB 8.3* 8.5*  HCT 26.6* 27.5*  PLT 126* 170   BMET:  Recent Labs  12/12/15 0359 12/13/15 0400  NA 140 140  K 4.0 4.1  CL 108 108  CO2 26 26  GLUCOSE 97 99  BUN 30* 30*  CREATININE 1.76* 1.77*  CALCIUM 9.0 9.2    PT/INR:  Lab Results  Component Value Date   INR 1.44 12/08/2015   INR 1.08 12/07/2015   INR 1.12 12/06/2015   ABG:  INR: Will add last result for INR, ABG once components are confirmed Will add last 4 CBG results once components are confirmed  Assessment/Plan:  1. CV - First degree heart block. BP no longer labile. Will consider low dose BB. 2.  Pulmonary - On 2 liters of oxygen via [place]. Wean as tolerates. CXR appears to show no pneumothorax, left pleural effusion, bibasilar atelectasis. Left hemidiaphragm dysfunction.  Questionable need for left thoracentesis. Encourage incentive spirometer 3. Volume Overload - Give Lasix 40 mg daily 4.  Acute blood loss anemia - H and H stable at 8.5 and 27.5 5. CKD-Creatinine remains 1.77. Appears baseline around 1.6 prior to surgery 6. Remove EPW  ZIMMERMAN,DONIELLE MPA-C 12/13/2015,7:54 AM  Patient seen and examined, agree with above He is feeling better Walked 250' and 350' already today Likely home in AM  [place] C. Roxan Hockey, MD [facility] [phone]

## 2015-12-13 NOTE — Progress Notes (Signed)
Removed epicardial wires per order. 4 intact.  Pt tolerated procedure well.  Pt instructed to remain on bedrest for one hour.  Frequent vitals will be taken and documented. Pt resting with call bell within reach. Payton Emerald, RN

## 2015-12-13 NOTE — Discharge Instructions (Signed)
Activity: 1.May walk up steps °               2.No lifting more than ten pounds for four weeks.  °               3.No driving for four weeks. °               4.Stop any activity that causes chest pain, shortness of breath, dizziness, sweating or excessive weakness. °               5.Avoid straining. °               6.Continue with your breathing exercises daily. ° °Diet: Diabetic diet and Low fat, Low salt diet ° °Wound Care: May shower.  Clean wounds with mild soap and water daily. Contact the office at [phone] if any problems arise. ° °Coronary Artery Bypass Grafting, Care After °Refer to this sheet in the next few weeks. These instructions provide you with information on caring for yourself after your procedure. Your health care provider may also give you more specific instructions. Your treatment has been planned according to current medical practices, but problems sometimes occur. Call your health care provider if you have any problems or questions after your procedure. °WHAT TO EXPECT AFTER THE PROCEDURE °Recovery from surgery will be different for everyone. Some people feel well after 3 or 4 weeks, while for others it takes longer. After your procedure, it is typical to have the following: °· Nausea and a lack of appetite.   °· Constipation. °· Weakness and fatigue.   °· Depression or irritability.   °· Pain or discomfort at your incision site. °HOME CARE INSTRUCTIONS °· Take medicines only as directed by your health care provider. Do not stop taking medicines or start any new medicines without first checking with your health care provider. °· Take your pulse as directed by your health care provider. °· Perform deep breathing as directed by your health care provider. If you were given a device called an incentive spirometer, use it to practice deep breathing several times a day. Support your chest with a pillow or your arms when you take deep breaths or cough. °· Keep incision areas clean, dry, and  protected. Remove or change any bandages (dressings) only as directed by your health care provider. You may have skin adhesive strips over the incision areas. Do not take the strips off. They will fall off on their own. °· Check incision areas daily for any swelling, redness, or drainage. °· If incisions were made in your legs, do the following: °¨ Avoid crossing your legs.   °¨ Avoid sitting for long periods of time. Change positions every 30 minutes.   °¨ Elevate your legs when you are sitting. °· Wear compression stockings as directed by your health care provider. These stockings help keep blood clots from forming in your legs. °· Take showers once your health care provider approves. Until then, only take sponge baths. Pat incisions dry. Do not rub incisions with a washcloth or towel. Do not take baths, swim, or use a hot tub until your health care provider approves. °· Eat foods that are high in fiber, such as raw fruits and vegetables, whole grains, beans, and nuts. Meats should be lean cut. Avoid canned, processed, and fried foods. °· Drink enough fluid to keep your urine clear or pale yellow. °· Weigh yourself every day. This helps identify if you are retaining fluid that may make your heart and lungs   and lungs work harder. °· Rest and limit activity as directed by your health care provider. You may be instructed to: °¨ Stop any activity at once if you have chest pain, shortness of breath, irregular heartbeats, or dizziness. Get help right away if you have any of these symptoms. °¨ Move around frequently for short periods or take short walks as directed by your health care provider. Increase your activities gradually. You may need physical therapy or cardiac rehabilitation to help strengthen your muscles and build your endurance. °¨ Avoid lifting, pushing, or pulling anything heavier than 10 lb (4.5 kg) for at least 6 weeks after surgery. °· Do not drive until your health care provider approves.  °· Ask your health  care provider when you may return to work. °· Ask your health care provider when you may resume sexual activity. °· Keep all follow-up visits as directed by your health care provider. This is important. °SEEK MEDICAL CARE IF: °· You have swelling, redness, increasing pain, or drainage at the site of an incision. °· You have a fever. °· You have swelling in your ankles or legs. °· You have pain in your legs.   °· You gain 2 or more pounds (0.9 kg) a day. °· You are nauseous or vomit. °· You have diarrhea.  °SEEK IMMEDIATE MEDICAL CARE IF: °· You have chest pain that goes to your jaw or arms. °· You have shortness of breath.   °· You have a fast or irregular heartbeat.   °· You notice a "clicking" in your breastbone (sternum) when you move.   °· You have numbness or weakness in your arms or legs. °· You feel dizzy or light-headed.   °MAKE SURE YOU: °· Understand these instructions. °· Will watch your condition. °· Will get help right away if you are not doing well or get worse. °  °This information is not intended to replace advice given to you by your health care provider. Make sure you discuss any questions you have with your health care provider. °  °Document Released: 04/26/2005 Document Revised: 10/28/2014 Document Reviewed: 03/16/2013 °Elsevier Interactive Patient Education ©2016 [employer] Inc. ° °

## 2015-12-14 LAB — BASIC METABOLIC PANEL
Anion gap: 11 (ref 5–15)
BUN: 28 mg/dL — AB (ref 6–20)
CALCIUM: 9.4 mg/dL (ref 8.9–10.3)
CO2: 25 mmol/L (ref 22–32)
CREATININE: 1.71 mg/dL — AB (ref 0.61–1.24)
Chloride: 106 mmol/L (ref 101–111)
GFR calc Af Amer: 39 mL/min — ABNORMAL LOW (ref >60)
GFR, EST NON AFRICAN AMERICAN: 33 mL/min — AB (ref >60)
GLUCOSE: 103 mg/dL — AB (ref 65–99)
POTASSIUM: 4 mmol/L (ref 3.5–5.1)
SODIUM: 142 mmol/L (ref 135–145)

## 2015-12-14 MED ORDER — FUROSEMIDE 40 MG PO TABS: 40.0000 mg | ORAL_TABLET | Freq: Every day | ORAL | Status: DC

## 2015-12-14 MED ORDER — POTASSIUM CHLORIDE CRYS ER 20 MEQ PO TBCR: 20.0000 meq | EXTENDED_RELEASE_TABLET | Freq: Every day | ORAL | Status: DC

## 2015-12-14 MED ORDER — CARVEDILOL 3.125 MG PO TABS: 3.1250 mg | ORAL_TABLET | Freq: Two times a day (BID) | ORAL | Status: DC

## 2015-12-14 MED ORDER — TRAMADOL HCL 50 MG PO TABS: 50.0000 mg | ORAL_TABLET | ORAL | Status: DC | PRN

## 2015-12-14 NOTE — Care Management Note (Signed)
Case Management Note Previous CM note initiated by [facility] RN, CM  Patient Details  Name: [facility] MRN: [id number] Date of Birth: 1923/01/09  Subjective/Objective:    Pt lives with spouse, has cane and walker that belonged to his sister.  PT recommends home therapy and pt agrees.  Provided list of [facility] home health agencies to spouse and made a referral to [facility] per choice.                             Action/Plan: S/p CABG plan to return home with wife- on 12/14/15- pt and wife no longer feel pt needs HH- pt ambulating independently- no HH order written- pt will return home with wife and no HH.   Expected Discharge Date:    12/14/15              Expected Discharge Plan:  [place]  In-House Referral:     Discharge planning Services  CM Consult  Post Acute Care Choice:  Home Health Choice offered to:  Spouse  DME Arranged:    DME Agency:     HH Arranged:  PT [place]:  [place]  Status of Service:  Completed, signed off  Medicare Important Message Given:  Yes Date Medicare IM Given:    Medicare IM give by:    Date Additional Medicare IM Given:    Additional Medicare Important Message give by:     If discussed at [place] of Stay Meetings, dates discussed:    Discharge Disposition: Home/self care   Additional Comments:  Dawayne Patricia, RN 12/14/2015, 11:13 AM

## 2015-12-14 NOTE — Progress Notes (Signed)
      [address]       [place] [address]             [phone]      6 Days Post-Op Procedure(s) (LRB): CORONARY ARTERY BYPASS GRAFTING (CABG) (N/A) TRANSESOPHAGEAL ECHOCARDIOGRAM (TEE) (N/A)   Subjective:  Frederick [facility] has no complaints this morning.  He states he is ready to go home.  He is ambulating without difficulty.  Objective: Vital signs in last 24 hours: Temp:  [98.3 F (36.8 C)-98.4 F (36.9 C)] 98.4 F (36.9 C) (02/23 0642) Pulse Rate:  [86-92] 92 (02/23 0642) Cardiac Rhythm:  [-] Heart block (02/22 1939) Resp:  [18] 18 (02/23 0642) BP: (107-127)/(61-79) 118/68 mmHg (02/23 0642) SpO2:  [94 %-97 %] 97 % (02/23 0642) Weight:  [176 lb 12.9 oz (80.2 kg)] 176 lb 12.9 oz (80.2 kg) (02/23 0642)  Intake/Output from previous day: 02/22 0701 - 02/23 0700 In: 240 [P.O.:240] Out: 350 [Urine:350]  General appearance: alert, cooperative and no distress Heart: regular rate and rhythm Lungs: clear to auscultation bilaterally Abdomen: soft, non-tender; bowel sounds normal; no masses,  no organomegaly Extremities: edema trace Wound: clean and dry  Lab Results:  Recent Labs  12/12/15 0359 12/13/15 0400  WBC 4.6 5.4  HGB 8.3* 8.5*  HCT 26.6* 27.5*  PLT 126* 170   BMET:  Recent Labs  12/13/15 0400 12/14/15 0345  NA 140 142  K 4.1 4.0  CL 108 106  CO2 26 25  GLUCOSE 99 103*  BUN 30* 28*  CREATININE 1.77* 1.71*  CALCIUM 9.2 9.4    PT/INR: No results for input(s): LABPROT, INR in the last 72 hours. ABG    Component Value Date/Time   PHART 7.287* 12/08/2015 1907   HCO3 21.6 12/08/2015 1907   TCO2 23 12/09/2015 1731   ACIDBASEDEF 5.0* 12/08/2015 1907   O2SAT 99.0 12/08/2015 1907   CBG (last 3)   Recent Labs  12/11/15 1630 12/11/15 2159 12/12/15 0811  GLUCAP 118* 83 86    Assessment/Plan: S/P Procedure(s) (LRB): CORONARY ARTERY BYPASS GRAFTING (CABG) (N/A) TRANSESOPHAGEAL ECHOCARDIOGRAM (TEE) (N/A)  1. CV- 1st degree  heart block, mild tachy, BP remains labile- may be able to start BB as an outpatient 2. Pulm- no acute issues, off oxygen, continue IS 3. Renal- creatinine stable at 1.71, will taper Lasix  4. Dispo- patient is stable, ambulating independently, will plan to d/c home today   LOS: 9 days    Frederick Davis, Frederick Davis 12/14/2015

## 2015-12-14 NOTE — Progress Notes (Signed)
Physical Therapy Treatment Patient Details Name: Frederick [facility] MRN: [id number] DOB: 1923-06-05 Today's Date: 12/14/2015    History of Present Illness Patient is a 82 y/o male with hx of HTN, prostate ca, CAD, MI and CKD stage III, rectum ca presents s/p CABG x3.    PT Comments    Patient more tired this AM and reporting left knee pain/stiffness (chronic condition). Tolerated standing balance at sink performing ADLs without UE support for ~8 minutes. Performed stair training with Min A for balance/safety. Some difficulty ascending steps due to left knee instability. Reviewed sternal precautions and need for sternal pillow during activities. Pt verbalized understanding. Will follow acutely.   Follow Up Recommendations  Home health PT;Supervision/Assistance - 24 hour     Equipment Recommendations  None recommended by PT    Recommendations for Other Services       Precautions / Restrictions Precautions Precautions: Fall;Sternal Restrictions Weight Bearing Restrictions: No    Mobility  Bed Mobility               General bed mobility comments: Sitting EOB upon PT arrival.   Transfers Overall transfer level: Needs assistance Equipment used: Rolling walker (2 wheeled) Transfers: Sit to/from Stand Sit to Stand: Min guard;Min assist         General transfer comment: Min guard for safety, use of momentum and anterior weight shift to stand. Min A to boost from low chair due to knee pain and weakness. Able to stand without assist from other surfaces. Cues to hold heart pillow.  Ambulation/Gait Ambulation/Gait assistance: Supervision Ambulation Distance (Feet): 15 Feet (x2 bouts) Assistive device: Rolling walker (2 wheeled) Gait Pattern/deviations: Decreased stance time - left;Decreased step length - right;Trunk flexed Gait velocity: decreased   General Gait Details: Able to do within room ambulation today with left knee pain/stiffness. Fatigued. Walked earlier in AM  with RN.    Stairs Stairs: Yes Stairs assistance: Min assist Stair Management: Forwards;One rail Left;Step to pattern Number of Stairs: 4 General stair comments: Cues for technique and Min A for ascend due to left knee weakness. No buckling but instability noted LLE during ascension. HR up to 125 bpm.  Wheelchair Mobility    Modified Rankin (Stroke Patients Only)       Balance Overall balance assessment: Needs assistance Sitting-balance support: Feet supported;No upper extremity supported Sitting balance-Leahy Scale: Good     Standing balance support: During functional activity Standing balance-Leahy Scale: Fair Standing balance comment: Able to perform ADLs standing at sink - brushing teeth, combing hair and washing face without UE support. No LOB. Fatigues needing to sit and rest.                     Cognition Arousal/Alertness: Awake/alert Behavior During Therapy: WFL for tasks assessed/performed Overall Cognitive Status: Within Functional Limits for tasks assessed                      Exercises      General Comments General comments (skin integrity, edema, etc.): Wife present during session.      Pertinent Vitals/Pain Pain Assessment: Faces Faces Pain Scale: Hurts a little bit Pain Location: left knee Pain Descriptors / Indicators: Sore Pain Intervention(s): Monitored during session;Repositioned    Home Living                      Prior Function            PT Goals (current goals  can now be found in the care plan section) Progress towards PT goals: Progressing toward goals    Frequency  Min 3X/week    PT Plan Current plan remains appropriate    Co-evaluation             End of Session Equipment Utilized During Treatment: Gait belt Activity Tolerance: Patient limited by fatigue Patient left: in chair;with call bell/phone within reach;with family/visitor present     Time: 6213-0865 PT Time Calculation (min) (ACUTE  ONLY): 34 min  Charges:  $Gait Training: 8-22 mins $Therapeutic Activity: 8-22 mins                    G Codes:      Kaelin Holford A Raywood Wailes 12/14/2015, 10:06 AM Mylo Red, PT, DPT [phone]

## 2015-12-14 NOTE — Progress Notes (Signed)
Patient discharged to home with wife. They stated they understood the discharge instructions and medications. 2  IV's was dc'd and was intact.

## 2015-12-14 NOTE — Progress Notes (Signed)
CARDIAC REHAB PHASE I   Pt up in recliner, states he and his wife walked earlier this morning and he just finished working with PT. Cardiac surgery discharge education completed with pt and wife at bedside. Reviewed IS, sternal precautions, activity progression, exercise, heart healthy diet, sodium restrictions, and phase 2 cardiac rehab. Pt and wife verbalized understanding. Pt agrees to phase 2 cardiac rehab referral, will send to [place] per pt request. Pt in recliner, call bell within reach.  BB:[id number] Lenna Sciara, RN, BSN 12/14/2015 9:47 AM

## 2015-12-27 ENCOUNTER — Encounter: Payer: Self-pay | Admitting: Cardiology

## 2015-12-27 ENCOUNTER — Ambulatory Visit (INDEPENDENT_AMBULATORY_CARE_PROVIDER_SITE_OTHER): Payer: Medicare Other | Admitting: Cardiology

## 2015-12-27 ENCOUNTER — Telehealth: Payer: Self-pay | Admitting: Cardiology

## 2015-12-27 VITALS — BP 132/70 | HR 56 | Ht 66.0 in | Wt 171.0 lb

## 2015-12-27 DIAGNOSIS — R2681 Unsteadiness on feet: Secondary | ICD-10-CM

## 2015-12-27 DIAGNOSIS — I2510 Atherosclerotic heart disease of native coronary artery without angina pectoris: Secondary | ICD-10-CM

## 2015-12-27 NOTE — Telephone Encounter (Signed)
Pt is calling in wanting to get checked out by Dr. Percival Spanish today in [place] if possible.He says that he lives about a 1/4 mile away.  He is feeling very anxious since his triple bypass and would like to get some assurance. He seems to be very worried. Please f/u with him as soon as possible.

## 2015-12-27 NOTE — Patient Instructions (Signed)
Medication Instructions:  The current medical regimen is effective;  continue present plan and medications.  Follow-Up: Follow up as scheduled with Dr Percival Spanish.  If you need a refill on your cardiac medications before your next appointment, please call your pharmacy.  Thank you for choosing [facility]!!

## 2015-12-27 NOTE — Telephone Encounter (Signed)
Pt given an add on appt for today at 11:15 am.  He is aware he may have to wait as he is a work in.

## 2015-12-27 NOTE — Telephone Encounter (Signed)
Spoke with pt, he has anxiety that he reports he can not control. He is scared and feels down. He is wanting to see dr hochrein just to get reassurance. He has an appt on 01-08-16 but feels he can not wait to be seen. He has not had a home health nurse come out either. Will call and talk to pam and dr hochrein

## 2015-12-27 NOTE — Progress Notes (Signed)
HPI The patient presents for followup after a acute inferior wall infarct in May of 2014. He had thrombectomy of a right coronary artery occlusion. He was initially acute respiratory failure and was cooled. However, he responded very well to therapy.   I last saw him he was having some chest discomfort. The patient was sent for cardiac catheterization. This demonstrated severe LAD stenosis and RCA stenosis. He subsequently underwent a CABG 3 on February 17. His postoperative course was relatively unremarkable given his advanced age. He called today because he was anxious and wanted his appointment. He's had some chest discomfort when he lies on one side or the other in bed. This is not like previous angina. He's not had any acute shortness of breath, PND or orthopnea. He's had no fevers or chills. He's had no palpitations, presyncope or syncope. He's had some mild lower externally swelling. His appetite is reduced he's fatigued. His wife has been taking into the church where he walks with his walker a little bit.   Allergies  Allergen Reactions  . Lipitor [Atorvastatin] Other (See Comments)    Muscle aches    Prior to Admission medications   Medication Sig Start Date End Date Taking? Authorizing Provider  aspirin 81 MG tablet Take 1 tablet (81 mg total) by mouth daily. 03/18/13  Yes Rhonda G Barrett, PA-C  Bioflavonoid Products (BIOFLEX) TABS Take 1 tablet by mouth daily.   Yes Historical Provider, MD  carvedilol (COREG) 3.125 MG tablet Take 1 tablet (3.125 mg total) by mouth 2 (two) times daily with a meal. 12/14/15  Yes Erin R Barrett, PA-C  ferrous gluconate (FERGON) 324 MG tablet Take 324 mg by mouth daily with breakfast.   Yes Historical Provider, MD  levothyroxine (SYNTHROID, LEVOTHROID) 75 MCG tablet TAKE 1 TABLET DAILY 03/13/15  Yes Chipper Herb, MD  Multiple Vitamin (MULITIVITAMIN WITH MINERALS) TABS Take 1 tablet by mouth daily.   Yes Historical Provider, MD  pantoprazole (PROTONIX)  40 MG tablet Take 1 tablet (40 mg total) by mouth daily. Patient not taking: Reported on 12/27/2015 10/18/15   Leanor Kail, PA     Past Medical History  Diagnosis Date  . Hypothyroidism   . Hypertension   . Rectum cancer ([place]) 1986  . PC (prostate cancer) ([place]) 1987  . Renal cell cancer ([place])   . Osteoporosis   . Esophageal stricture   . Anemia   . CAD (coronary artery disease)     RCA throbectomy 2014  . Chronic renal insufficiency, stage III (moderate)   . Myocardial infarction ([place]) 03/12/13    Inferior MI.  LAD mid 80% stenosis, circ mild plaque, RCA 100% with thrombectomy PTCA.  EF 65%    Past Surgical History  Procedure Laterality Date  . Kidney surgery  2004  . Radiofrequency ablation kidney  2006  . Hernia repair      2007  . Renal sphincter appliance  2007  . Colonoscopy  01/08/2012    Procedure: COLONOSCOPY;  Surgeon: Rogene Houston, MD;  [facility];  Service: Endoscopy;  Laterality: N/A;  1030  . Colostomy Left 1986    colon cancer  . Percutaneous coronary intervention-balloon only    . Left heart catheterization with coronary angiogram N/A 03/12/2013    Procedure: STEMI;  Surgeon: Burnell Blanks, MD;  Location: [facility] CATH LAB;  Service: Cardiovascular;  Laterality: N/A;  . Prostatectomy    . Right inguinal hernia repair    . Colonoscopy N/A 07/20/2015  Procedure: COLONOSCOPY;  Surgeon: Rogene Houston, MD;  Location: [facility];  Service: Endoscopy;  Laterality: N/A;  930 - moved to 9/29 @ 2:00  . Cardiac catheterization N/A 12/06/2015    Procedure: Left Heart Cath and Coronary Angiography;  Surgeon: Burnell Blanks, MD;  Location: [facility] CV LAB;  Service: Cardiovascular;  Laterality: N/A;  . Coronary artery bypass graft N/A 12/08/2015    Procedure: CORONARY ARTERY BYPASS GRAFTING (CABG);  Surgeon: Melrose Nakayama, MD;  Location: [facility];  Service: Open Heart Surgery;  Laterality: N/A;  Times 3 using left internal mammary  artery and endoscopycally harvested bilateral saphenous vein.  Marland Kitchen Tee without cardioversion N/A 12/08/2015    Procedure: TRANSESOPHAGEAL ECHOCARDIOGRAM (TEE);  Surgeon: Melrose Nakayama, MD;  Location: [facility];  Service: Open Heart Surgery;  Laterality: N/A;    ROS:  As stated in the HPI and negative for all other systems.  PHYSICAL EXAM BP 132/70 mmHg  Pulse 56  Ht 5\' 6"  (1.676 m)  Wt 171 lb (77.565 kg)  BMI 27.61 kg/m2 GENERAL:  Well appearing, uncomfortable from his back NECK:  No jugular venous distention, waveform within normal limits, carotid upstroke brisk and symmetric, no bruits, no thyromegaly LYMPHATICS:  No cervical, inguinal adenopathy CHEST: Healing sternotomy scar. LUNGS:  Clear to auscultation bilaterally BACK:  No CVA tenderness, mild lordosis CHEST:  Unremarkable HEART:  PMI not displaced or sustained,S1 and S2 within normal limits, no S3, no S4, no clicks, no rubs, no murmurs ABD:  Flat, positive bowel sounds normal in frequency in pitch, no bruits, no rebound, no guarding, no midline pulsatile mass, no hepatomegaly, no splenomegaly, , colostomy bag in place EXT:  2 plus pulses throughout, no edema, no cyanosis no clubbing  EKG:  Sinus rhythm, rate 86 ,, left axis deviation, first degree AV block, no acute ST-T wave changes, PVCs. 12/27/2015  ASSESSMENT AND PLAN  CAD/CABG:  He actually is doing remarkably well given his age. I gave him reassurance today. He does need home physical therapy and this was recommended in the hospital by the physical therapy note. I will placed this order. He is not yet ready for cardiac rehabilitation.  HTN:  His blood pressure is at the upper limits. I will not titrate meds at this point.  DYSLIPIDEMIA:  He has been intolerant of 1 statin in the future. I will consider trying a different one in the future.   AORTIC STENOSIS:  This is moderate and he will be followed clinically.

## 2015-12-28 ENCOUNTER — Telehealth: Payer: Self-pay | Admitting: *Deleted

## 2015-12-28 DIAGNOSIS — R2681 Unsteadiness on feet: Secondary | ICD-10-CM

## 2015-12-28 DIAGNOSIS — M6281 Muscle weakness (generalized): Secondary | ICD-10-CM

## 2015-12-28 NOTE — Telephone Encounter (Signed)
-----   Message from Fidel Levy, RN sent at 12/28/2015  2:29 PM EST ----- Regarding: FW: HH PT order Please assist with this [facility] PT order placed yesterday   Thanks   ----- Message -----    From: Darlina Guys    Sent: 12/28/2015   2:11 PM      To: Fidel Levy, RN Subject: RE: [facility] PT order                                There's a referral in Epic from yesterday 3/8.  It's just the wrong referral template ----- Message -----    From: Fidel Levy, RN    Sent: 12/28/2015   1:55 PM      To: Darlina Guys Subject: [facility] PT order                                    Hi Melissa!   I was trying to look back in EPIC to see when this order was placed and I was unable to locate that information. When did it come thru to y'all?   ----- Message -----    From: Darlina Guys    Sent: 12/28/2015   1:40 PM      To: Cristopher Estimable, RN, Fidel Levy, RN  Good afternoon! We received Dr. Rosezella [place] faxed referral for home health PT for this patient.  The referral was entered into Epic using the wrong template. Could you please have the MD enter a "Referral to [facility]" please?  This order has all the Medicare required documentation on it such as Face to Face info and homebound info.  Please message me back when this is ready.  Once we receive this, we can get the patient scheduled.  Thank you! [place] [phone]

## 2015-12-28 NOTE — Telephone Encounter (Signed)
Completed order as requested

## 2015-12-30 DIAGNOSIS — N183 Chronic kidney disease, stage 3 (moderate): Secondary | ICD-10-CM | POA: Diagnosis not present

## 2015-12-30 DIAGNOSIS — R269 Unspecified abnormalities of gait and mobility: Secondary | ICD-10-CM | POA: Diagnosis not present

## 2015-12-30 DIAGNOSIS — Z8546 Personal history of malignant neoplasm of prostate: Secondary | ICD-10-CM | POA: Diagnosis not present

## 2015-12-30 DIAGNOSIS — Z8500 Personal history of malignant neoplasm of unspecified digestive organ: Secondary | ICD-10-CM | POA: Diagnosis not present

## 2015-12-30 DIAGNOSIS — I129 Hypertensive chronic kidney disease with stage 1 through stage 4 chronic kidney disease, or unspecified chronic kidney disease: Secondary | ICD-10-CM | POA: Diagnosis not present

## 2015-12-30 DIAGNOSIS — M810 Age-related osteoporosis without current pathological fracture: Secondary | ICD-10-CM | POA: Diagnosis not present

## 2015-12-30 DIAGNOSIS — E039 Hypothyroidism, unspecified: Secondary | ICD-10-CM | POA: Diagnosis not present

## 2015-12-30 DIAGNOSIS — I252 Old myocardial infarction: Secondary | ICD-10-CM | POA: Diagnosis not present

## 2015-12-30 DIAGNOSIS — I25110 Atherosclerotic heart disease of native coronary artery with unstable angina pectoris: Secondary | ICD-10-CM | POA: Diagnosis not present

## 2015-12-30 DIAGNOSIS — Z85048 Personal history of other malignant neoplasm of rectum, rectosigmoid junction, and anus: Secondary | ICD-10-CM | POA: Diagnosis not present

## 2016-01-01 ENCOUNTER — Other Ambulatory Visit: Payer: Self-pay | Admitting: Family Medicine

## 2016-01-01 DIAGNOSIS — I129 Hypertensive chronic kidney disease with stage 1 through stage 4 chronic kidney disease, or unspecified chronic kidney disease: Secondary | ICD-10-CM | POA: Diagnosis not present

## 2016-01-01 DIAGNOSIS — N183 Chronic kidney disease, stage 3 (moderate): Secondary | ICD-10-CM | POA: Diagnosis not present

## 2016-01-01 DIAGNOSIS — I25110 Atherosclerotic heart disease of native coronary artery with unstable angina pectoris: Secondary | ICD-10-CM | POA: Diagnosis not present

## 2016-01-01 DIAGNOSIS — E039 Hypothyroidism, unspecified: Secondary | ICD-10-CM | POA: Diagnosis not present

## 2016-01-01 DIAGNOSIS — R269 Unspecified abnormalities of gait and mobility: Secondary | ICD-10-CM | POA: Diagnosis not present

## 2016-01-01 DIAGNOSIS — M810 Age-related osteoporosis without current pathological fracture: Secondary | ICD-10-CM | POA: Diagnosis not present

## 2016-01-03 DIAGNOSIS — N183 Chronic kidney disease, stage 3 (moderate): Secondary | ICD-10-CM | POA: Diagnosis not present

## 2016-01-03 DIAGNOSIS — E039 Hypothyroidism, unspecified: Secondary | ICD-10-CM | POA: Diagnosis not present

## 2016-01-03 DIAGNOSIS — M810 Age-related osteoporosis without current pathological fracture: Secondary | ICD-10-CM | POA: Diagnosis not present

## 2016-01-03 DIAGNOSIS — I129 Hypertensive chronic kidney disease with stage 1 through stage 4 chronic kidney disease, or unspecified chronic kidney disease: Secondary | ICD-10-CM | POA: Diagnosis not present

## 2016-01-03 DIAGNOSIS — R269 Unspecified abnormalities of gait and mobility: Secondary | ICD-10-CM | POA: Diagnosis not present

## 2016-01-03 DIAGNOSIS — I25110 Atherosclerotic heart disease of native coronary artery with unstable angina pectoris: Secondary | ICD-10-CM | POA: Diagnosis not present

## 2016-01-04 NOTE — Addendum Note (Signed)
Addended by: Freada Bergeron on: 01/04/2016 03:55 PM   Modules accepted: Orders

## 2016-01-05 DIAGNOSIS — I129 Hypertensive chronic kidney disease with stage 1 through stage 4 chronic kidney disease, or unspecified chronic kidney disease: Secondary | ICD-10-CM | POA: Diagnosis not present

## 2016-01-05 DIAGNOSIS — M810 Age-related osteoporosis without current pathological fracture: Secondary | ICD-10-CM | POA: Diagnosis not present

## 2016-01-05 DIAGNOSIS — N183 Chronic kidney disease, stage 3 (moderate): Secondary | ICD-10-CM | POA: Diagnosis not present

## 2016-01-05 DIAGNOSIS — E039 Hypothyroidism, unspecified: Secondary | ICD-10-CM | POA: Diagnosis not present

## 2016-01-05 DIAGNOSIS — R269 Unspecified abnormalities of gait and mobility: Secondary | ICD-10-CM | POA: Diagnosis not present

## 2016-01-05 DIAGNOSIS — I25110 Atherosclerotic heart disease of native coronary artery with unstable angina pectoris: Secondary | ICD-10-CM | POA: Diagnosis not present

## 2016-01-10 ENCOUNTER — Encounter: Payer: Self-pay | Admitting: Cardiology

## 2016-01-10 ENCOUNTER — Ambulatory Visit (INDEPENDENT_AMBULATORY_CARE_PROVIDER_SITE_OTHER): Payer: Medicare Other | Admitting: Cardiology

## 2016-01-10 VITALS — BP 118/82 | HR 56 | Ht 66.0 in | Wt 170.0 lb

## 2016-01-10 DIAGNOSIS — I10 Essential (primary) hypertension: Secondary | ICD-10-CM

## 2016-01-10 DIAGNOSIS — I2510 Atherosclerotic heart disease of native coronary artery without angina pectoris: Secondary | ICD-10-CM

## 2016-01-10 NOTE — Patient Instructions (Signed)
Medication Instructions:  The current medical regimen is effective;  continue present plan and medications.  Follow-Up: Follow up in 2 months with Dr Hochrein in [name].  If you need a refill on your cardiac medications before your next appointment, please call your pharmacy.  Thank you for choosing [facility]!!     

## 2016-01-10 NOTE — Progress Notes (Signed)
HPI The patient presents for followup after a acute inferior wall infarct in May of 2014. He had thrombectomy of a right coronary artery occlusion. He was initially acute respiratory failure and was cooled. However, he responded very well to therapy.   Earlier this year the patient was having chest pain.  He was sent for cardiac catheterization. This demonstrated severe LAD stenosis and RCA stenosis. He subsequently underwent a CABG 3 on February 17. His postoperative course was relatively unremarkable given his advanced age.  This is his second appt post OP.  At the last visit I ordered home PT.  He has worked with them.  He is doing relatively well.  He did have a fall while going to sit on the couch.  Otherwise he has had no acute complaints.  He still has some dyspnea but not severe and no PND or orthopnea.   He denies any fever or chills.  He has had no cough.    Allergies  Allergen Reactions  . Lipitor [Atorvastatin] Other (See Comments)    Muscle aches    Prior to Admission medications   Medication Sig Start Date End Date Taking? Authorizing Provider  aspirin 81 MG tablet Take 1 tablet (81 mg total) by mouth daily. 03/18/13  Yes Rhonda G Barrett, PA-C  Bioflavonoid Products (BIOFLEX) TABS Take 1 tablet by mouth daily.   Yes Historical Provider, MD  carvedilol (COREG) 3.125 MG tablet Take 1 tablet (3.125 mg total) by mouth 2 (two) times daily with a meal. 12/14/15  Yes Erin R Barrett, PA-C  ferrous gluconate (FERGON) 324 MG tablet Take 324 mg by mouth daily with breakfast.   Yes Historical Provider, MD  levothyroxine (SYNTHROID, LEVOTHROID) 75 MCG tablet TAKE 1 TABLET DAILY 03/13/15  Yes Chipper Herb, MD  Multiple Vitamin (MULITIVITAMIN WITH MINERALS) TABS Take 1 tablet by mouth daily.   Yes Historical Provider, MD  pantoprazole (PROTONIX) 40 MG tablet Take 1 tablet (40 mg total) by mouth daily. Patient not taking: Reported on 12/27/2015 10/18/15   Leanor Kail, PA     Past  Medical History  Diagnosis Date  . Hypothyroidism   . Hypertension   . Rectum cancer ([place]) 1986  . PC (prostate cancer) ([place]) 1987  . Renal cell cancer ([place])   . Osteoporosis   . Esophageal stricture   . Anemia   . CAD (coronary artery disease)     RCA throbectomy 2014  . Chronic renal insufficiency, stage III (moderate)   . Myocardial infarction ([place]) 03/12/13    Inferior MI.  LAD mid 80% stenosis, circ mild plaque, RCA 100% with thrombectomy PTCA.  EF 65%    Past Surgical History  Procedure Laterality Date  . Kidney surgery  2004  . Radiofrequency ablation kidney  2006  . Hernia repair      2007  . Renal sphincter appliance  2007  . Colonoscopy  01/08/2012    Procedure: COLONOSCOPY;  Surgeon: Rogene Houston, MD;  [facility];  Service: Endoscopy;  Laterality: N/A;  1030  . Colostomy Left 1986    colon cancer  . Percutaneous coronary intervention-balloon only    . Left heart catheterization with coronary angiogram N/A 03/12/2013    Procedure: STEMI;  Surgeon: Burnell Blanks, MD;  Location: [facility] CATH LAB;  Service: Cardiovascular;  Laterality: N/A;  . Prostatectomy    . Right inguinal hernia repair    . Colonoscopy N/A 07/20/2015    Procedure: COLONOSCOPY;  Surgeon: Mechele Dawley  Laural Golden, MD;  [facility]: [facility];  Service: Endoscopy;  Laterality: N/A;  930 - moved to 9/29 @ 2:00  . Cardiac catheterization N/A 12/06/2015    Procedure: Left Heart Cath and Coronary Angiography;  Surgeon: Burnell Blanks, MD;  Location: [facility] CV LAB;  Service: Cardiovascular;  Laterality: N/A;  . Coronary artery bypass graft N/A 12/08/2015    Procedure: CORONARY ARTERY BYPASS GRAFTING (CABG);  Surgeon: Melrose Nakayama, MD;  Location: [facility];  Service: Open Heart Surgery;  Laterality: N/A;  Times 3 using left internal mammary artery and endoscopycally harvested bilateral saphenous vein.  Marland Kitchen Tee without cardioversion N/A 12/08/2015    Procedure: TRANSESOPHAGEAL  ECHOCARDIOGRAM (TEE);  Surgeon: Melrose Nakayama, MD;  Location: [facility];  Service: Open Heart Surgery;  Laterality: N/A;    ROS:  As stated in the HPI and negative for all other systems.  PHYSICAL EXAM BP 118/82 mmHg  Pulse 56  Ht 5\' 6"  (1.676 m)  Wt 170 lb (77.111 kg)  BMI 27.45 kg/m2 GENERAL:  Well appearing, uncomfortable from his back NECK:  No jugular venous distention, waveform within normal limits, carotid upstroke brisk and symmetric, no bruits, no thyromegaly LYMPHATICS:  No cervical, inguinal adenopathy CHEST: Healing sternotomy scar. LUNGS:  Clear to auscultation bilaterally BACK:  No CVA tenderness, mild lordosis CHEST:  Unremarkable HEART:  PMI not displaced or sustained,S1 and S2 within normal limits, no S3, no S4, no clicks, no rubs, no murmurs ABD:  Flat, positive bowel sounds normal in frequency in pitch, no bruits, no rebound, no guarding, no midline pulsatile mass, no hepatomegaly, no splenomegaly, , colostomy bag in place EXT:  2 plus pulses throughout, no edema, no cyanosis no clubbing  EKG:  Sinus rhythm, rate 87, left axis deviation, first degree AV block, no acute ST-T wave changes, PVCs. 01/10/2016  ASSESSMENT AND PLAN  CAD/CABG:  He continues to recover.  He does not want cardiac rehab yet.  We will continue to discuss this.    HTN:  The blood pressure is at target. No change in medications is indicated. We will continue with therapeutic lifestyle changes (TLC).  DYSLIPIDEMIA:  He has been intolerant of 1 statin in the past. I will consider trying a different one in the future.   AORTIC STENOSIS:  This is moderate and he will be followed clinically.

## 2016-01-11 DIAGNOSIS — E039 Hypothyroidism, unspecified: Secondary | ICD-10-CM | POA: Diagnosis not present

## 2016-01-11 DIAGNOSIS — I129 Hypertensive chronic kidney disease with stage 1 through stage 4 chronic kidney disease, or unspecified chronic kidney disease: Secondary | ICD-10-CM | POA: Diagnosis not present

## 2016-01-11 DIAGNOSIS — R269 Unspecified abnormalities of gait and mobility: Secondary | ICD-10-CM | POA: Diagnosis not present

## 2016-01-11 DIAGNOSIS — N183 Chronic kidney disease, stage 3 (moderate): Secondary | ICD-10-CM | POA: Diagnosis not present

## 2016-01-11 DIAGNOSIS — I25110 Atherosclerotic heart disease of native coronary artery with unstable angina pectoris: Secondary | ICD-10-CM | POA: Diagnosis not present

## 2016-01-11 DIAGNOSIS — M810 Age-related osteoporosis without current pathological fracture: Secondary | ICD-10-CM | POA: Diagnosis not present

## 2016-01-15 ENCOUNTER — Other Ambulatory Visit: Payer: Self-pay | Admitting: Thoracic Surgery (Cardiothoracic Vascular Surgery)

## 2016-01-15 DIAGNOSIS — Z951 Presence of aortocoronary bypass graft: Secondary | ICD-10-CM

## 2016-01-15 DIAGNOSIS — I25110 Atherosclerotic heart disease of native coronary artery with unstable angina pectoris: Secondary | ICD-10-CM | POA: Diagnosis not present

## 2016-01-15 DIAGNOSIS — R269 Unspecified abnormalities of gait and mobility: Secondary | ICD-10-CM | POA: Diagnosis not present

## 2016-01-15 DIAGNOSIS — M810 Age-related osteoporosis without current pathological fracture: Secondary | ICD-10-CM | POA: Diagnosis not present

## 2016-01-15 DIAGNOSIS — I129 Hypertensive chronic kidney disease with stage 1 through stage 4 chronic kidney disease, or unspecified chronic kidney disease: Secondary | ICD-10-CM | POA: Diagnosis not present

## 2016-01-15 DIAGNOSIS — N183 Chronic kidney disease, stage 3 (moderate): Secondary | ICD-10-CM | POA: Diagnosis not present

## 2016-01-15 DIAGNOSIS — E039 Hypothyroidism, unspecified: Secondary | ICD-10-CM | POA: Diagnosis not present

## 2016-01-16 ENCOUNTER — Ambulatory Visit
Admission: RE | Admit: 2016-01-16 | Discharge: 2016-01-16 | Disposition: A | Discharge status: Home / Self Care | Payer: Medicare Other | Source: Ambulatory Visit | Attending: Thoracic Surgery (Cardiothoracic Vascular Surgery) | Admitting: Thoracic Surgery (Cardiothoracic Vascular Surgery)

## 2016-01-16 ENCOUNTER — Encounter: Payer: Self-pay | Admitting: Thoracic Surgery (Cardiothoracic Vascular Surgery)

## 2016-01-16 ENCOUNTER — Ambulatory Visit (INDEPENDENT_AMBULATORY_CARE_PROVIDER_SITE_OTHER): Payer: Self-pay | Admitting: Thoracic Surgery (Cardiothoracic Vascular Surgery)

## 2016-01-16 VITALS — BP 140/80 | HR 46 | Resp 16 | Ht 66.0 in | Wt 170.0 lb

## 2016-01-16 DIAGNOSIS — Z951 Presence of aortocoronary bypass graft: Secondary | ICD-10-CM

## 2016-01-16 DIAGNOSIS — J90 Pleural effusion, not elsewhere classified: Secondary | ICD-10-CM | POA: Diagnosis not present

## 2016-01-16 DIAGNOSIS — I509 Heart failure, unspecified: Secondary | ICD-10-CM | POA: Diagnosis not present

## 2016-01-16 MED ORDER — FUROSEMIDE 20 MG PO TABS: 20.0000 mg | ORAL_TABLET | Freq: Every day | ORAL | Status: DC

## 2016-01-16 NOTE — Progress Notes (Signed)
[address]       [place] [address]             [phone]       HPI: Mr. Frederick Davis returns today for scheduled postoperative follow-up visit.  He is a 82 year old man with a complicated medical history including multiple malignancies and chronic kidney disease. Despite that he actually is active and was doing reasonably well. He presented with unstable angina. Catheterization showed severe two-vessel disease not amenable to percutaneous intervention. He underwent coronary bypass grafting on 12/08/2015. His postoperative course was uncomplicated and he went home on postoperative day #6.  He feels well. He is not taking any pain medication and never had to fill his prescription after discharge. He denies any angina. He feels like his exercise tolerance is slowly improving. He has noticed some swelling in his legs.  Past Medical History  Diagnosis Date  . Hypothyroidism   . Hypertension   . Rectum cancer ([place]) 1986  . PC (prostate cancer) ([place]) 1987  . Renal cell cancer ([place])   . Osteoporosis   . Esophageal stricture   . Anemia   . CAD (coronary artery disease)     RCA throbectomy 2014  . Chronic renal insufficiency, stage III (moderate)   . Myocardial infarction ([place]) 03/12/13    Inferior MI.  LAD mid 80% stenosis, circ mild plaque, RCA 100% with thrombectomy PTCA.  EF 65%      Current Outpatient Prescriptions  Medication Sig Dispense Refill  . aspirin 81 MG tablet Take 1 tablet (81 mg total) by mouth daily. 30 tablet   . Bioflavonoid Products (BIOFLEX) TABS Take 1 tablet by mouth daily.    . carvedilol (COREG) 3.125 MG tablet Take 1 tablet (3.125 mg total) by mouth 2 (two) times daily with a meal. 60 tablet 3  . ferrous gluconate (FERGON) 324 MG tablet Take 324 mg by mouth daily with breakfast.    . levothyroxine (SYNTHROID, LEVOTHROID) 75 MCG tablet Take 75 mcg by mouth daily before breakfast.    . Multiple Vitamin (MULITIVITAMIN WITH MINERALS) TABS  Take 1 tablet by mouth daily.    . furosemide (LASIX) 20 MG tablet Take 1 tablet (20 mg total) by mouth daily. 30 tablet 0   No current facility-administered medications for this visit.   Facility-Administered Medications Ordered in Other Visits  Medication Dose Route Frequency Provider Last Rate Last Dose  . etomidate (AMIDATE) injection    PRN Terrill Mohr, CRNA   10 mg at 03/12/13 1541  . lidocaine (cardiac) 100 mg/55ml (XYLOCAINE) 20 MG/ML injection 2%    PRN Terrill Mohr, CRNA   50 mg at 12/08/15 0853  . succinylcholine (ANECTINE) injection    PRN Terrill Mohr, CRNA   100 mg at 03/12/13 1541    Physical Exam BP 140/80 mmHg  Pulse 46  Resp 16  Ht 5\' 6"  (1.676 m)  Wt 170 lb (77.111 kg)  BMI 27.45 kg/m2  SpO31 54% 82 year old man in no acute distress Alert and oriented 3 with no focal neurologic deficits Cardiac regular rate and rhythm normal S1 and S2 Lungs slightly diminished at left base, otherwise clear Sternum stable, incision healing well Leg incision well-healed, 2+ pitting edema bilaterally  Diagnostic Tests: CHEST 2 VIEW  COMPARISON: 12/13/2015.  FINDINGS: Prior CABG. Cardiomegaly with normal pulmonary vascularity. Persistent bilateral pulmonary infiltrates and pleural effusions. Congestive heart failure could present this fashion. Bibasilar pneumonia cannot be excluded. No pneumothorax.  IMPRESSION: Prior CABG. Persistent cardiomegaly with bilateral pulmonary infiltrates and pleural effusions. Congestive heart failure present this fashion. Bibasilar pneumonia cannot be excluded . Similar findings noted on prior exam.   Electronically Signed  By: Marcello Moores Register  On: 01/16/2016 11:16 I personally reviewed the chest x-ray concurrent with findings noted above. He does appear to have some mild CHF, there is slight elevation of the left hemidiaphragm, improved from his previous film.  Impression: 82 year old man who is now 5 weeks out from  coronary bypass grafting. All things considered I think he is doing extremely well. He has had a notable improvement over the past week or so and his energy level and excess tolerance.  He does appear to be volume overloaded both by physical exam and his chest x-ray. I'm going to start Lasix 20 mg daily and will plan to see him back in a month. I am not convinced start potassium due to his baseline creatinine 1.7 but I did encourage him to eat a banana daily.  He will not drive until after see him back in a month.   Plan: Lasix 20 mg daily.  Return in one month  Melrose Nakayama, MD [facility] [phone]

## 2016-01-18 DIAGNOSIS — I129 Hypertensive chronic kidney disease with stage 1 through stage 4 chronic kidney disease, or unspecified chronic kidney disease: Secondary | ICD-10-CM | POA: Diagnosis not present

## 2016-01-18 DIAGNOSIS — I25110 Atherosclerotic heart disease of native coronary artery with unstable angina pectoris: Secondary | ICD-10-CM | POA: Diagnosis not present

## 2016-01-18 DIAGNOSIS — N183 Chronic kidney disease, stage 3 (moderate): Secondary | ICD-10-CM | POA: Diagnosis not present

## 2016-01-18 DIAGNOSIS — M810 Age-related osteoporosis without current pathological fracture: Secondary | ICD-10-CM | POA: Diagnosis not present

## 2016-01-18 DIAGNOSIS — R269 Unspecified abnormalities of gait and mobility: Secondary | ICD-10-CM | POA: Diagnosis not present

## 2016-01-18 DIAGNOSIS — E039 Hypothyroidism, unspecified: Secondary | ICD-10-CM | POA: Diagnosis not present

## 2016-02-09 ENCOUNTER — Other Ambulatory Visit: Payer: Medicare Other

## 2016-02-09 DIAGNOSIS — N183 Chronic kidney disease, stage 3 unspecified: Secondary | ICD-10-CM

## 2016-02-09 DIAGNOSIS — I2510 Atherosclerotic heart disease of native coronary artery without angina pectoris: Secondary | ICD-10-CM | POA: Diagnosis not present

## 2016-02-09 DIAGNOSIS — D509 Iron deficiency anemia, unspecified: Secondary | ICD-10-CM | POA: Diagnosis not present

## 2016-02-09 DIAGNOSIS — E559 Vitamin D deficiency, unspecified: Secondary | ICD-10-CM

## 2016-02-09 DIAGNOSIS — E785 Hyperlipidemia, unspecified: Secondary | ICD-10-CM | POA: Diagnosis not present

## 2016-02-09 DIAGNOSIS — I10 Essential (primary) hypertension: Secondary | ICD-10-CM | POA: Diagnosis not present

## 2016-02-10 LAB — CBC WITH DIFFERENTIAL/PLATELET
BASOS ABS: 0.1 10*3/uL (ref 0.0–0.2)
Basos: 1 %
EOS (ABSOLUTE): 0.3 10*3/uL (ref 0.0–0.4)
EOS: 4 %
HEMATOCRIT: 38.2 % (ref 37.5–51.0)
HEMOGLOBIN: 12.1 g/dL — AB (ref 12.6–17.7)
IMMATURE GRANS (ABS): 0 10*3/uL (ref 0.0–0.1)
IMMATURE GRANULOCYTES: 0 %
LYMPHS: 24 %
Lymphocytes Absolute: 1.8 10*3/uL (ref 0.7–3.1)
MCH: 27.2 pg (ref 26.6–33.0)
MCHC: 31.7 g/dL (ref 31.5–35.7)
MCV: 86 fL (ref 79–97)
MONOCYTES: 7 %
Monocytes Absolute: 0.5 10*3/uL (ref 0.1–0.9)
NEUTROS PCT: 64 %
Neutrophils Absolute: 4.8 10*3/uL (ref 1.4–7.0)
Platelets: 208 10*3/uL (ref 150–379)
RBC: 4.45 x10E6/uL (ref 4.14–5.80)
RDW: 14.3 % (ref 12.3–15.4)
WBC: 7.4 10*3/uL (ref 3.4–10.8)

## 2016-02-10 LAB — BMP8+EGFR
BUN / CREAT RATIO: 19 (ref 10–24)
BUN: 29 mg/dL (ref 10–36)
CALCIUM: 9.9 mg/dL (ref 8.6–10.2)
CHLORIDE: 102 mmol/L (ref 96–106)
CO2: 22 mmol/L (ref 18–29)
CREATININE: 1.52 mg/dL — AB (ref 0.76–1.27)
GFR calc Af Amer: 46 mL/min/{1.73_m2} — ABNORMAL LOW (ref >59)
GFR calc non Af Amer: 39 mL/min/{1.73_m2} — ABNORMAL LOW (ref >59)
GLUCOSE: 80 mg/dL (ref 65–99)
Potassium: 4.6 mmol/L (ref 3.5–5.2)
Sodium: 141 mmol/L (ref 134–144)

## 2016-02-10 LAB — HEPATIC FUNCTION PANEL
ALBUMIN: 4.1 g/dL (ref 3.2–4.6)
ALK PHOS: 98 IU/L (ref 39–117)
ALT: 12 IU/L (ref 0–44)
AST: 20 IU/L (ref 0–40)
BILIRUBIN TOTAL: 0.4 mg/dL (ref 0.0–1.2)
Bilirubin, Direct: 0.14 mg/dL (ref 0.00–0.40)
TOTAL PROTEIN: 6.4 g/dL (ref 6.0–8.5)

## 2016-02-10 LAB — NMR, LIPOPROFILE
Cholesterol: 142 mg/dL (ref 100–199)
HDL Cholesterol by NMR: 66 mg/dL (ref >39)
HDL PARTICLE NUMBER: 38.3 umol/L (ref >=30.5)
LDL Particle Number: 815 nmol/L (ref <1000)
LDL SIZE: 20.8 nm (ref >20.5)
LDL-C: 59 mg/dL (ref 0–99)
SMALL LDL PARTICLE NUMBER: 377 nmol/L (ref <=527)
Triglycerides by NMR: 87 mg/dL (ref 0–149)

## 2016-02-10 LAB — VITAMIN D 25 HYDROXY (VIT D DEFICIENCY, FRACTURES): VIT D 25 HYDROXY: 36.5 ng/mL (ref 30.0–100.0)

## 2016-02-13 ENCOUNTER — Encounter: Payer: Self-pay | Admitting: Family Medicine

## 2016-02-13 ENCOUNTER — Ambulatory Visit (INDEPENDENT_AMBULATORY_CARE_PROVIDER_SITE_OTHER): Payer: Medicare Other | Admitting: Family Medicine

## 2016-02-13 VITALS — BP 113/77 | HR 58 | Temp 96.8°F | Ht 66.0 in | Wt 165.0 lb

## 2016-02-13 DIAGNOSIS — C61 Malignant neoplasm of prostate: Secondary | ICD-10-CM | POA: Diagnosis not present

## 2016-02-13 DIAGNOSIS — I499 Cardiac arrhythmia, unspecified: Secondary | ICD-10-CM

## 2016-02-13 DIAGNOSIS — D509 Iron deficiency anemia, unspecified: Secondary | ICD-10-CM | POA: Diagnosis not present

## 2016-02-13 DIAGNOSIS — N183 Chronic kidney disease, stage 3 unspecified: Secondary | ICD-10-CM

## 2016-02-13 DIAGNOSIS — E785 Hyperlipidemia, unspecified: Secondary | ICD-10-CM | POA: Diagnosis not present

## 2016-02-13 DIAGNOSIS — Z933 Colostomy status: Secondary | ICD-10-CM | POA: Diagnosis not present

## 2016-02-13 DIAGNOSIS — M25562 Pain in left knee: Secondary | ICD-10-CM | POA: Diagnosis not present

## 2016-02-13 DIAGNOSIS — E559 Vitamin D deficiency, unspecified: Secondary | ICD-10-CM

## 2016-02-13 DIAGNOSIS — I2510 Atherosclerotic heart disease of native coronary artery without angina pectoris: Secondary | ICD-10-CM

## 2016-02-13 NOTE — Addendum Note (Signed)
Addended by: Zannie Cove on: 02/13/2016 01:55 PM   Modules accepted: Orders, SmartSet

## 2016-02-13 NOTE — Patient Instructions (Addendum)
.  pgharm   Continue current medications. Continue good therapeutic lifestyle changes which include good diet and exercise. Fall precautions discussed with patient. If an FOBT was given today- please return it to our front desk. If you are over 80 years old - you may need Prevnar 23 or the adult Pneumonia vaccine.  **Flu shots are available--- please call and schedule a FLU-CLINIC appointment**  After your visit with [place] today you will receive a survey in the mail or online from [employer] regarding your care with [place]. Please take a moment to fill this out. Your feedback is very important to [place] as you can help [place] better understand your patient needs as well as improve your experience and satisfaction. WE CARE ABOUT YOU!!!   Continue follow-up with cardiology and vascular surgery as planned Monitor weights regularly Increase physical activity gradually Continue to be careful and do not put yourself at risk for falling Make an appointment with orthopedic surgery for knee pain and injection

## 2016-02-13 NOTE — Addendum Note (Signed)
Addended by: Zannie Cove on: 02/13/2016 02:04 PM   Modules accepted: Miquel Dunn

## 2016-02-13 NOTE — Progress Notes (Signed)
Subjective:    Patient ID: Frederick Davis, male    DOB: May 19, 1923, 82 y.o.   MRN: [id number]  HPI Pt here for follow up and management of chronic medical problems which includes hyperlipidemia. He is taking medications regularly.The patient has been doing extremely well considering his age and having had the bypass surgery. He has been taking some Lasix from his vascular surgeon and will finish the last dose today. He's been monitoring his weights at home and they have been stable according to the patient. He is alert and positive and his demeanor. He denies any chest pain. He does have some shortness of breath but he says this is getting better with time. This is related to exertion. He has no GI symptoms no heartburn indigestion nausea vomiting or diarrhea. He does have some problems with leakage and he has a prosthesis for controlling his bladder. He has a urologic visit planned to follow-up on this. He also has a visit scheduled with the vascular surgeon for follow-up. He plans to make an appointment with orthopedic surgeon for another left knee injection. He is alert and calm in his demeanor.     Patient Active Problem List   Diagnosis Date Noted  . S/P CABG x 3 12/08/2015  . Unstable angina ([place])   . Chest pain with moderate risk of acute coronary syndrome 12/05/2015  . Aortic stenosis, mild-2014 12/05/2015  . Anginal pain ([place]) 10/17/2015  . History of renal cell cancer-s/p nephrectomy 07/19/2013  . Hyperlipidemia 07/19/2013  . Hypotension arterial 03/18/2013  . Complete heart block ([place]) 03/18/2013  . Anemia   . CKD (chronic kidney disease) stage 3, GFR 30-59 ml/min 03/12/2013  . Acute MI, inferior wall, initial episode of care ([place]) 03/12/2013  . Impotence 03/12/2013  . History of renal stone 03/12/2013  . Hiatal hernia 03/12/2013  . H/O lithotripsy 03/12/2013  . SCCA (squamous cell carcinoma) of skin 03/12/2013  . S/P prostatectomy, radical 03/12/2013  . Left renal mass  03/12/2013  . Arthritis 03/12/2013  . History of STEMI- May 2014 03/12/2013  . CAD S/P RCA thrombectomy May 2014- residual LAD disease then 03/12/2013  . Cardiogenic shock ([place]) 03/12/2013  . Acute respiratory failure ([place]) 03/12/2013  . Altered mental status 03/12/2013  . Essential hypertension, benign 01/14/2013  . Colostomy in place [facility]) 01/14/2013  . Prostate cancer ([place]) 01/14/2013  . Renal cell carcinoma, Right 01/14/2013  . Osteoporosis, unspecified 01/14/2013   Outpatient Encounter Prescriptions as of 02/13/2016  Medication Sig  . aspirin 81 MG tablet Take 1 tablet (81 mg total) by mouth daily.  Marland Kitchen Bioflavonoid Products (BIOFLEX) TABS Take 1 tablet by mouth daily.  . carvedilol (COREG) 3.125 MG tablet Take 1 tablet (3.125 mg total) by mouth 2 (two) times daily with a meal.  . ferrous gluconate (FERGON) 324 MG tablet Take 324 mg by mouth daily with breakfast.  . furosemide (LASIX) 20 MG tablet Take 1 tablet (20 mg total) by mouth daily.  Marland Kitchen levothyroxine (SYNTHROID, LEVOTHROID) 75 MCG tablet Take 75 mcg by mouth daily before breakfast.  . Multiple Vitamin (MULITIVITAMIN WITH MINERALS) TABS Take 1 tablet by mouth daily.   Facility-Administered Encounter Medications as of 02/13/2016  Medication  . etomidate (AMIDATE) injection  . lidocaine (cardiac) 100 mg/87ml (XYLOCAINE) 20 MG/ML injection 2%  . succinylcholine (ANECTINE) injection      Review of Systems  Constitutional: Negative.   HENT: Negative.   Eyes: Negative.   Respiratory: Negative.   Cardiovascular: Negative.  Gastrointestinal: Negative.   Endocrine: Negative.   Genitourinary: Negative.   Musculoskeletal: Positive for arthralgias (left knee pain - on-going).  Skin: Negative.   Allergic/Immunologic: Negative.   Neurological: Negative.   Hematological: Negative.   Psychiatric/Behavioral: Negative.        Objective:   Physical Exam  Constitutional: He is oriented to person, place, and time. He appears  well-developed and well-nourished. No distress.  The patient looks incredibly good for his age of 70 years ago considering all that he is been through recently.  HENT:  Head: Normocephalic and atraumatic.  Right Ear: External ear normal.  Left Ear: External ear normal.  Nose: Nose normal.  Mouth/Throat: Oropharynx is clear and moist. No oropharyngeal exudate.  Eyes: Conjunctivae and EOM are normal. Pupils are equal, round, and reactive to light. Right eye exhibits no discharge. Left eye exhibits no discharge. No scleral icterus.  Neck: Normal range of motion. Neck supple. No thyromegaly present.  Cardiovascular: Normal rate, normal heart sounds and intact distal pulses.   No murmur heard. The heart had a bigeminy rhythm today    Pulmonary/Chest: Effort normal and breath sounds normal. No respiratory distress. He has no wheezes. He has no rales. He exhibits no tenderness.  Clear anteriorly and posteriorly  Abdominal: Soft. Bowel sounds are normal. He exhibits no mass. There is no tenderness. There is no rebound and no guarding.  Colostomy in place  Genitourinary:  Penile prosthesis with dribbling from the penis but no sign of any redness or inflammation.  Musculoskeletal: Normal range of motion. He exhibits edema. He exhibits no tenderness.  Slight edema left leg compared to the right  Lymphadenopathy:    He has no cervical adenopathy.  Neurological: He is alert and oriented to person, place, and time. He has normal reflexes. No cranial nerve deficit.  Skin: Skin is warm and dry. No rash noted.  Psychiatric: He has a normal mood and affect. His behavior is normal. Judgment and thought content normal.  Nursing note and vitals reviewed.  BP 113/77 mmHg  Pulse 58  Temp(Src) 96.8 F (36 C) (Oral)  Ht 5\' 6"  (1.676 m)  Wt 165 lb (74.844 kg)  BMI 26.64 kg/m2        Assessment & Plan:  1. ASCVD (arteriosclerotic cardiovascular disease) -Continue to follow-up with cardiology and  vascular surgery  2. CKD (chronic kidney disease) stage 3, GFR 30-59 ml/min -Recent creatinine had actually improved to the previous one that was checked. It is still elevated.  3. Prostate cancer ([place]) -Continue to follow-up with urology  4. Hyperlipidemia -All cholesterol numbers were excellent  5. Vitamin D deficiency -Currently the patient is not taking any vitamin D and we will leave him off the vitamin D as his vitamin D level is within normal limits.  6. Iron deficiency anemia -His hemoglobin is greatly improved and he will continue to take the iron medicine at least until the next visit.  7. Colostomy in place [facility]) -He is having no problems with the colostomy.  8. Left knee pain -He will make an appointment with orthopedic surgeon for possible knee injection.  Patient Instructions  .pgharm   Continue current medications. Continue good therapeutic lifestyle changes which include good diet and exercise. Fall precautions discussed with patient. If an FOBT was given today- please return it to our front desk. If you are over 76 years old - you may need Prevnar 47 or the adult Pneumonia vaccine.  **Flu shots are available--- please call and schedule  a FLU-CLINIC appointment**  After your visit with [place] today you will receive a survey in the mail or online from [employer] regarding your care with [place]. Please take a moment to fill this out. Your feedback is very important to [place] as you can help [place] better understand your patient needs as well as improve your experience and satisfaction. WE CARE ABOUT YOU!!!   Continue follow-up with cardiology and vascular surgery as planned Monitor weights regularly Increase physical activity gradually Continue to be careful and do not put yourself at risk for falling Make an appointment with orthopedic surgery for knee pain and injection   Arrie Senate MD

## 2016-02-15 ENCOUNTER — Other Ambulatory Visit: Payer: Self-pay | Admitting: Thoracic Surgery (Cardiothoracic Vascular Surgery)

## 2016-02-15 ENCOUNTER — Encounter (HOSPITAL_COMMUNITY): Payer: Medicare Other

## 2016-02-19 ENCOUNTER — Other Ambulatory Visit: Payer: Self-pay | Admitting: Thoracic Surgery (Cardiothoracic Vascular Surgery)

## 2016-02-19 DIAGNOSIS — Z951 Presence of aortocoronary bypass graft: Secondary | ICD-10-CM

## 2016-02-20 ENCOUNTER — Ambulatory Visit
Admission: RE | Admit: 2016-02-20 | Discharge: 2016-02-20 | Disposition: A | Discharge status: Home / Self Care | Payer: Medicare Other | Source: Ambulatory Visit | Attending: Thoracic Surgery (Cardiothoracic Vascular Surgery) | Admitting: Thoracic Surgery (Cardiothoracic Vascular Surgery)

## 2016-02-20 ENCOUNTER — Encounter: Payer: Self-pay | Admitting: Thoracic Surgery (Cardiothoracic Vascular Surgery)

## 2016-02-20 ENCOUNTER — Ambulatory Visit (INDEPENDENT_AMBULATORY_CARE_PROVIDER_SITE_OTHER): Payer: Self-pay | Admitting: Thoracic Surgery (Cardiothoracic Vascular Surgery)

## 2016-02-20 VITALS — BP 130/83 | HR 45 | Resp 20 | Ht 66.0 in | Wt 164.0 lb

## 2016-02-20 DIAGNOSIS — E8770 Fluid overload, unspecified: Secondary | ICD-10-CM

## 2016-02-20 DIAGNOSIS — Z951 Presence of aortocoronary bypass graft: Secondary | ICD-10-CM

## 2016-02-20 DIAGNOSIS — J9811 Atelectasis: Secondary | ICD-10-CM | POA: Diagnosis not present

## 2016-02-20 NOTE — Progress Notes (Signed)
[address]       [place], [address]             [phone]       HPI: Frederick Davis returns for scheduled follow-up visit.  He is a 82 year old man with a complicated medical history including multiple malignancies and chronic kidney disease. Despite those issues, he is active and was doing reasonably well. He presented with unstable angina. Catheterization showed severe two-vessel disease not amenable to percutaneous intervention. He underwent coronary bypass grafting on 12/08/2015. His postoperative course was uncomplicated and he went home on postoperative day #6.  I saw him in the office about a month postoperatively. He was doing reasonably well at that time, but was a little volume overloaded. He has some baseline chronic kidney disease with a creatinine of 1.7. I put him on 20 mg of Lasix daily.  He saw Dr. Laurance Flatten last week. His creatinine was down to 1.52.  He feels well. He is going to start cardiac rehabilitation next week. He has not had any recurrent angina. He says he has "a little bit of swelling" in his legs. He just had his prescription for Lasix refilled and has been taking it the last couple of days.  Past Medical History  Diagnosis Date  . Hypothyroidism   . Hypertension   . Rectum cancer ([facility]) 1986  . PC (prostate cancer) ([facility]) 1987  . Renal cell cancer ([facility])   . Osteoporosis   . Esophageal stricture   . Anemia   . CAD (coronary artery disease)     RCA throbectomy 2014  . Chronic renal insufficiency, stage III (moderate)   . Myocardial infarction ([facility]) 03/12/13    Inferior MI.  LAD mid 80% stenosis, circ mild plaque, RCA 100% with thrombectomy PTCA.  EF 65%      Current Outpatient Prescriptions  Medication Sig Dispense Refill  . aspirin 81 MG tablet Take 1 tablet (81 mg total) by mouth daily. 30 tablet   . Bioflavonoid Products (BIOFLEX) TABS Take 1 tablet by mouth daily. Reported on 02/13/2016    . carvedilol (COREG) 3.125 MG tablet  Take 1 tablet (3.125 mg total) by mouth 2 (two) times daily with a meal. 60 tablet 3  . furosemide (LASIX) 20 MG tablet TAKE 1 TABLET DAILY 30 tablet 0  . levothyroxine (SYNTHROID, LEVOTHROID) 75 MCG tablet Take 75 mcg by mouth daily before breakfast.    . Multiple Vitamin (MULITIVITAMIN WITH MINERALS) TABS Take 1 tablet by mouth daily. Reported on 02/13/2016     No current facility-administered medications for this visit.   Facility-Administered Medications Ordered in Other Visits  Medication Dose Route Frequency Provider Last Rate Last Dose  . etomidate (AMIDATE) injection    PRN Terrill Mohr, CRNA   10 mg at 03/12/13 1541  . lidocaine (cardiac) 100 mg/2ml (XYLOCAINE) 20 MG/ML injection 2%    PRN Terrill Mohr, CRNA   50 mg at 12/08/15 0853  . succinylcholine (ANECTINE) injection    PRN Terrill Mohr, CRNA   100 mg at 03/12/13 1541    Physical Exam BP 130/83 mmHg  Pulse 45  Resp 20  Ht 5\' 6"  (1.676 m)  Wt 164 lb (74.39 kg)  BMI 26.48 kg/m2  SpO56 51% 82 year old man in no acute distress Alert and oriented 3 with no focal deficits Cardiac Regularly irregular Lungs clear with equal breath sounds bilaterally Sternum stable, incision well-healed Leg incisions well healed, trace edema  Diagnostic Tests: CHEST 2 VIEW  COMPARISON: 01/16/2016  FINDINGS: Cardiac silhouette is normal in size and configuration. Stable changes from the recent CABG surgery. No mediastinal or hilar masses or adenopathy. Moderate size hiatal hernia, stable.  There is opacity at the left lung base consistent with atelectasis, without significant change from the prior study. Remainder of the lungs is clear. No convincing pleural effusion. No pneumothorax.  Bony thorax is demineralized. There is mild anterior wedging of a mid thoracic vertebra, stable.  IMPRESSION: 1. No acute cardiopulmonary disease. 2. Mild persistent left lung base atelectasis without change the prior  study.   Electronically Signed  By: Lajean Manes [name]  On: 02/20/2016 10:38 I personally reviewed the chest x-ray and concur with findings as noted above.  Impression: 82 year old man who is now 2-1/2 months out from coronary bypass grafting. He is doing extremely well. He has not had any recurrent angina. He is anxious to start cardiac rehabilitation.  His heart rate was relatively slow today. The blood pressure machine measured 45. With time and also was 60. He is on low-dose Coreg. He's asymptomatic so I did not change that today.  His creatinine is down from 1.7-1.5. I recommended that he not take the Lasix and follow his weight. If he notices his weight increasing then he can start taking 20 mg daily again. If his weight remains stable and he doesn't have any issues with edema he can just stop that medication altogether.  Plan: He will follow-up with Dr. Laurance Flatten and Dr. Percival Spanish.   I will be happy to see him back any time in the future if I can be of any further assistance with his care.  Frederick Nakayama, MD [facility] [phone]

## 2016-02-22 ENCOUNTER — Encounter (HOSPITAL_COMMUNITY): Payer: Medicare Other

## 2016-02-23 ENCOUNTER — Encounter (HOSPITAL_COMMUNITY)
Admission: RE | Admit: 2016-02-23 | Discharge: 2016-02-23 | Disposition: A | Discharge status: Home / Self Care | Payer: Medicare Other | Source: Ambulatory Visit | Attending: Cardiology | Admitting: Cardiology

## 2016-02-23 ENCOUNTER — Encounter (HOSPITAL_COMMUNITY): Payer: Self-pay

## 2016-02-23 VITALS — Ht 66.0 in | Wt 163.7 lb

## 2016-02-23 DIAGNOSIS — Z951 Presence of aortocoronary bypass graft: Secondary | ICD-10-CM | POA: Insufficient documentation

## 2016-02-23 NOTE — Progress Notes (Signed)
Cardiac Individual Treatment Plan  Patient Details  Name: Frederick Davis MRN: [id number] Date of Birth: 1977/01/09 Referring Provider:        [facility] from 02/23/2016 in [facility]   Referring Provider  Dr. Percival Spanish      Initial Encounter Date:       CARDIAC REHAB PHASE II ORIENTATION from 02/23/2016 in [facility]   Date  02/23/16   Referring Provider  Dr. Percival Spanish      Visit Diagnosis: S/P CABG x 3  Patient's Home Medications on Admission:  Current outpatient prescriptions:  .  acetaminophen (TYLENOL) 500 MG tablet, Take 500 mg by mouth at bedtime., Disp: , Rfl:  .  aspirin 81 MG tablet, Take 1 tablet (81 mg total) by mouth daily., Disp: 30 tablet, Rfl:  .  carvedilol (COREG) 3.125 MG tablet, Take 1 tablet (3.125 mg total) by mouth 2 (two) times daily with a meal., Disp: 60 tablet, Rfl: 3 .  furosemide (LASIX) 20 MG tablet, TAKE 1 TABLET DAILY (Patient taking differently: daily prn), Disp: 30 tablet, Rfl: 0 .  levothyroxine (SYNTHROID, LEVOTHROID) 75 MCG tablet, Take 75 mcg by mouth daily before breakfast., Disp: , Rfl:  No current facility-administered medications for this encounter.  Facility-Administered Medications Ordered in Other Encounters:  .  etomidate (AMIDATE) injection, , , PRN, Terrill Mohr, CRNA, 10 mg at 03/12/13 1541 .  lidocaine (cardiac) 100 mg/85ml (XYLOCAINE) 20 MG/ML injection 2%, , , PRN, Terrill Mohr, CRNA, 50 mg at 12/08/15 0853 .  succinylcholine (ANECTINE) injection, , , PRN, Terrill Mohr, CRNA, 100 mg at 03/12/13 1541  Past Medical History: Past Medical History  Diagnosis Date  . Hypothyroidism   . Hypertension   . Rectum cancer ([facility]) 1986  . PC (prostate cancer) ([facility]) 1987  . Renal cell cancer ([facility])   . Osteoporosis   . Esophageal stricture   . Anemia   . CAD (coronary artery disease)     RCA throbectomy 2014  . Chronic renal insufficiency, stage III (moderate)   .  Myocardial infarction ([facility]) 03/12/13    Inferior MI.  LAD mid 80% stenosis, circ mild plaque, RCA 100% with thrombectomy PTCA.  EF 65%    Tobacco Use: History  Smoking status  . Former Smoker -- 0.50 packs/day for 10 years  . Types: Cigarettes  . Start date: 10/21/1946  . Quit date: 10/21/1968  Smokeless tobacco  . Never Used    Labs:     Recent Review Flowsheet Data    Labs for ITP Cardiac and Pulmonary Rehab Latest Ref Rng 12/08/2015 12/08/2015 12/09/2015 12/09/2015 02/09/2016   Cholestrol 100 - 199 mg/dL - - - - 142   HDL >39 mg/dL - - - - 66   Trlycerides 0 - 149 mg/dL - - - - 87   PHART 7.350 - 7.450 7.287(L) - - - -   PCO2ART 35.0 - 45.0 mmHg 45.0 - - - -   HCO3 20.0 - 24.0 mEq/L 21.6 - - - -   TCO2 0 - 100 mmol/L 23 22 22 23  -   ACIDBASEDEF 0.0 - 2.0 mmol/L 5.0(H) - - - -   O2SAT - 99.0 - - - -      Capillary Blood Glucose: Lab Results  Component Value Date   GLUCAP 86 12/12/2015   GLUCAP 83 12/11/2015   GLUCAP 118* 12/11/2015   GLUCAP 95 12/11/2015   GLUCAP 87 12/11/2015  Exercise Target Goals: Date: 02/23/16  Exercise Program Goal: Individual exercise prescription set with THRR, safety & activity barriers. Participant demonstrates ability to understand and report RPE using BORG scale, to self-measure pulse accurately, and to acknowledge the importance of the exercise prescription.  Exercise Prescription Goal: Starting with aerobic activity 30 plus minutes a day, 3 days per week for initial exercise prescription. Provide home exercise prescription and guidelines that participant acknowledges understanding prior to discharge.  Activity Barriers & Risk Stratification:     Activity Barriers & Cardiac Risk Stratification - 02/23/16 1412    Activity Barriers & Cardiac Risk Stratification   Activity Barriers Deconditioning;Balance Concerns;Left Knee Replacement   Cardiac Risk Stratification High      6 Minute Walk:     6 Minute Walk      02/23/16 1818        6 Minute Walk   Phase Initial     Distance 900 feet     Walk Time 6 minutes     # of Rest Breaks 0     MPH 1.7     METS 2.3     RPE 12     Perceived Dyspnea  13     VO2 Peak 5.59     Symptoms No     Resting HR 56 bpm     Resting BP 112/80 mmHg     Max Ex. HR 70 bpm     Max Ex. BP 148/90 mmHg     2 Minute Post BP 138/86 mmHg        Initial Exercise Prescription:     Initial Exercise Prescription - 02/23/16 1800    Date of Initial Exercise RX and Referring Provider   Date 02/23/16   Referring Provider Dr. Percival Spanish   NuStep   Level 2   Watts 25   Minutes 15   METs 1.9   Arm Ergometer   Level 2.5   Watts 16   Minutes 15   METs 2.5   Prescription Details   Frequency (times per week) 3   Duration Progress to 30 minutes of continuous aerobic without signs/symptoms of physical distress   Intensity   THRR REST +  30   THRR 40-80% of Max Heartrate 85-100-114   Ratings of Perceived Exertion 11-13   Perceived Dyspnea 0-4   Progression   Progression Continue to progress workloads to maintain intensity without signs/symptoms of physical distress.   Resistance Training   Training Prescription Yes   Weight 1   Reps 10-12      Perform Capillary Blood Glucose checks as needed.  Exercise Prescription Changes:   Exercise Comments:    Discharge Exercise Prescription (Final Exercise Prescription Changes):   Nutrition:  Target Goals: Understanding of nutrition guidelines, daily intake of sodium 1500mg , cholesterol 200mg , calories 30% from fat and 7% or less from saturated fats, daily to have 5 or more servings of fruits and vegetables.  Biometrics:     Pre Biometrics - 02/23/16 1829    Pre Biometrics   Height 5\' 6"  (1.676 m)   Weight 163 lb 11.2 oz (74.254 kg)   Waist Circumference 36 inches   Hip Circumference 38 inches   Waist to Hip Ratio 0.95 %   BMI (Calculated) 26.5   Triceps Skinfold 5 mm   % Body Fat 21.8 %   Grip Strength 60.6 kg    Flexibility 0 in   Single Leg Stand 5 seconds       Nutrition Therapy Plan  and Nutrition Goals:   Nutrition Discharge: Rate Your Plate Scores:     Nutrition Assessments - 02/23/16 1516    MEDFICTS Scores   Pre Score 67      Nutrition Goals Re-Evaluation:   Psychosocial: Target Goals: Acknowledge presence or absence of depression, maximize coping skills, provide positive support system. Participant is able to verbalize types and ability to use techniques and skills needed for reducing stress and depression.  Initial Review & Psychosocial Screening:     Initial Psych Review & Screening - 02/23/16 [place]? Yes   Barriers   Psychosocial barriers to participate in program There are no identifiable barriers or psychosocial needs.   Screening Interventions   Interventions Encouraged to exercise      Quality of Life Scores:     Quality of Life - 02/23/16 1831    Quality of Life Scores   Health/Function Pre 23.63 %   Socioeconomic Pre 29.17 %   Psych/Spiritual Pre 28.93 %   Family Pre 30 %   GLOBAL Pre 27.03 %      PHQ-9:     Recent Review Flowsheet Data    Depression screen [facility] 2/9 02/23/2016 02/13/2016 11/07/2015 11/01/2015 10/26/2015   Decreased Interest 0 0 0 0 0   Down, Depressed, Hopeless 0 0 0 0 0   PHQ - 2 Score 0 0 0 0 0   Altered sleeping 0 - - - -   Tired, decreased energy 0 - - - -   Change in appetite 1 - - - -   Feeling bad or failure about yourself  0 - - - -   Trouble concentrating 1 - - - -   Moving slowly or fidgety/restless 0 - - - -   Suicidal thoughts 0 - - - -   PHQ-9 Score 2 - - - -   Difficult doing work/chores Not difficult at all - - - -      Psychosocial Evaluation and Intervention:     Psychosocial Evaluation - 02/23/16 1517    Psychosocial Evaluation & Interventions   Interventions Relaxation education;Stress management education;Encouraged to exercise with the program and follow exercise  prescription   Continued Psychosocial Services Needed No      Psychosocial Re-Evaluation:   Vocational Rehabilitation: Provide vocational rehab assistance to qualifying candidates.   Vocational Rehab Evaluation & Intervention:     Vocational Rehab - 02/23/16 1514    Initial Vocational Rehab Evaluation & Intervention   Assessment shows need for Vocational Rehabilitation No      Education: Education Goals: Education classes will be provided on a weekly basis, covering required topics. Participant will state understanding/return demonstration of topics presented.  Learning Barriers/Preferences:     Learning Barriers/Preferences - 02/23/16 1513    Learning Barriers/Preferences   Learning Barriers None   Learning Preferences Written Material      Education Topics: Hypertension, Hypertension Reduction -Define heart disease and high blood pressure. Discus how high blood pressure affects the body and ways to reduce high blood pressure.   Exercise and Your Heart -Discuss why it is important to exercise, the FITT principles of exercise, normal and abnormal responses to exercise, and how to exercise safely.   Angina -Discuss definition of angina, causes of angina, treatment of angina, and how to decrease risk of having angina.   Cardiac Medications -Review what the following cardiac medications are used for, how they affect the body, and side effects that  may occur when taking the medications.  Medications include Aspirin, Beta blockers, calcium channel blockers, ACE Inhibitors, angiotensin receptor blockers, diuretics, digoxin, and antihyperlipidemics.   Congestive Heart Failure -Discuss the definition of CHF, how to live with CHF, the signs and symptoms of CHF, and how keep track of weight and sodium intake.   Heart Disease and Intimacy -Discus the effect sexual activity has on the heart, how changes occur during intimacy as we age, and safety during sexual  activity.   Smoking Cessation / COPD -Discuss different methods to quit smoking, the health benefits of quitting smoking, and the definition of COPD.   Nutrition I: Fats -Discuss the types of cholesterol, what cholesterol does to the heart, and how cholesterol levels can be controlled.   Nutrition II: Labels -Discuss the different components of food labels and how to read food label   Heart Parts and Heart Disease -Discuss the anatomy of the heart, the pathway of blood circulation through the heart, and these are affected by heart disease.   Stress I: Signs and Symptoms -Discuss the causes of stress, how stress may lead to anxiety and depression, and ways to limit stress.   Stress II: Relaxation -Discuss different types of relaxation techniques to limit stress.   Warning Signs of Stroke / TIA -Discuss definition of a stroke, what the signs and symptoms are of a stroke, and how to identify when someone is having stroke.   Knowledge Questionnaire Score:     Knowledge Questionnaire Score - 02/23/16 1514    Knowledge Questionnaire Score   Pre Score 20/24      Core Components/Risk Factors/Patient Goals at Admission:     Personal Goals and Risk Factors at Admission - 02/23/16 1516    Core Components/Risk Factors/Patient Goals on Admission   Improve shortness of breath with ADL's Yes   Intervention Provide education, individualized exercise plan and daily activity instruction to help decrease symptoms of SOB with activities of daily living.   Expected Outcomes Short Term: Achieves a reduction of symptoms when performing activities of daily living.      Core Components/Risk Factors/Patient Goals Review:      Goals and Risk Factor Review      02/23/16 1516           Core Components/Risk Factors/Patient Goals Review   Personal Goals Review Improve shortness of breath with ADL's          Core Components/Risk Factors/Patient Goals at Discharge (Final Review):       Goals and Risk Factor Review - 02/23/16 1516    Core Components/Risk Factors/Patient Goals Review   Personal Goals Review Improve shortness of breath with ADL's      ITP Comments:     ITP Comments      02/23/16 1514           ITP Comments Pt is a 82 year old male living with spouse and enjoys playing golf.  Patient does not exhibit any s/s of depression at this time.           Comments: Patient arrived for 1st visit/orientation/education at 1300. Patient was referred to CR by Dr. Percival Spanish due to CABG (Z95.1). During orientation advised patient on arrival and appointment times what to wear, what to do before, during and after exercise. Reviewed attendance and class policy. Talked about inclement weather and class consultation policy. Pt is scheduled to return Cardiac Rehab on 03/01/16 at [place]. Pt was advised to come to class 15 minutes before class  starts. He was also given instructions on meeting with the dietician and attending the Family Structure classes. Pt is eager to get started. Entrance PHQ score is 0, PHQ 9 score is 2.  Patient was able to complete 6 minute walk test. Patient was measured for the equipment. Discussed equipment safety with patient. Took patient pre-anthropometric measurements. Patient finished visit at 1500.

## 2016-02-23 NOTE — Progress Notes (Signed)
Cardiac/Pulmonary Rehab Medication Review by a Pharmacist  Does the patient  feel that his/her medications are working for him/her?  yes  Has the patient been experiencing any side effects to the medications prescribed?  no  Does the patient measure his/her own blood pressure or blood glucose at home?  no   Does the patient have any problems obtaining medications due to transportation or finances?   no  Understanding of regimen: excellent Understanding of indications: excellent Potential of compliance: excellent  Questions asked to Determine Patient Understanding of Medication Regimen:  1. What is the name of the medication?  2. What is the medication used for?  3. When should it be taken?  4. How much should be taken?  5. How will you take it?  6. What side effects should you report?  Understanding Defined as: Excellent: All questions above are correct Good: Questions 1-4 are correct Fair: Questions 1-2 are correct  Poor: 1 or none of the above questions are correct   Pharmacist comments: Pt is not having any side effects.  Pt takes very few medications.  No issues noted.  Pt will check BP at home every couple weeks but it is typically fine.  Hart Robinsons A 02/23/2016 1:15 PM

## 2016-02-26 DIAGNOSIS — D485 Neoplasm of uncertain behavior of skin: Secondary | ICD-10-CM | POA: Diagnosis not present

## 2016-02-26 DIAGNOSIS — L821 Other seborrheic keratosis: Secondary | ICD-10-CM | POA: Diagnosis not present

## 2016-02-26 DIAGNOSIS — Z85828 Personal history of other malignant neoplasm of skin: Secondary | ICD-10-CM | POA: Diagnosis not present

## 2016-02-26 DIAGNOSIS — L570 Actinic keratosis: Secondary | ICD-10-CM | POA: Diagnosis not present

## 2016-03-01 ENCOUNTER — Encounter (HOSPITAL_COMMUNITY)
Admission: RE | Admit: 2016-03-01 | Discharge: 2016-03-01 | Disposition: A | Discharge status: Home / Self Care | Payer: Medicare Other | Source: Ambulatory Visit | Attending: Cardiology | Admitting: Cardiology

## 2016-03-01 DIAGNOSIS — Z951 Presence of aortocoronary bypass graft: Secondary | ICD-10-CM | POA: Diagnosis not present

## 2016-03-04 ENCOUNTER — Encounter (HOSPITAL_COMMUNITY)
Admission: RE | Admit: 2016-03-04 | Discharge: 2016-03-04 | Disposition: A | Discharge status: Home / Self Care | Payer: Medicare Other | Source: Ambulatory Visit | Attending: Cardiology | Admitting: Cardiology

## 2016-03-04 DIAGNOSIS — Z951 Presence of aortocoronary bypass graft: Secondary | ICD-10-CM | POA: Diagnosis not present

## 2016-03-06 ENCOUNTER — Encounter (HOSPITAL_COMMUNITY)
Admission: RE | Admit: 2016-03-06 | Discharge: 2016-03-06 | Disposition: A | Discharge status: Home / Self Care | Payer: Medicare Other | Source: Ambulatory Visit | Attending: Cardiology | Admitting: Cardiology

## 2016-03-06 DIAGNOSIS — Z951 Presence of aortocoronary bypass graft: Secondary | ICD-10-CM | POA: Diagnosis not present

## 2016-03-08 ENCOUNTER — Encounter (HOSPITAL_COMMUNITY)
Admission: RE | Admit: 2016-03-08 | Discharge: 2016-03-08 | Disposition: A | Discharge status: Home / Self Care | Payer: Medicare Other | Source: Ambulatory Visit | Attending: Cardiology | Admitting: Cardiology

## 2016-03-08 DIAGNOSIS — Z951 Presence of aortocoronary bypass graft: Secondary | ICD-10-CM | POA: Diagnosis not present

## 2016-03-11 ENCOUNTER — Encounter (HOSPITAL_COMMUNITY)
Admission: RE | Admit: 2016-03-11 | Discharge: 2016-03-11 | Disposition: A | Discharge status: Home / Self Care | Payer: Medicare Other | Source: Ambulatory Visit | Attending: Cardiology | Admitting: Cardiology

## 2016-03-11 DIAGNOSIS — Z951 Presence of aortocoronary bypass graft: Secondary | ICD-10-CM | POA: Diagnosis not present

## 2016-03-12 ENCOUNTER — Other Ambulatory Visit: Payer: Self-pay | Admitting: Family Medicine

## 2016-03-13 ENCOUNTER — Encounter (HOSPITAL_COMMUNITY): Payer: Medicare Other

## 2016-03-13 ENCOUNTER — Ambulatory Visit (INDEPENDENT_AMBULATORY_CARE_PROVIDER_SITE_OTHER): Payer: Medicare Other | Admitting: Cardiology

## 2016-03-13 ENCOUNTER — Encounter: Payer: Self-pay | Admitting: Cardiology

## 2016-03-13 ENCOUNTER — Telehealth: Payer: Self-pay | Admitting: Cardiology

## 2016-03-13 VITALS — BP 150/80 | HR 38 | Ht 66.0 in | Wt 168.0 lb

## 2016-03-13 DIAGNOSIS — I2510 Atherosclerotic heart disease of native coronary artery without angina pectoris: Secondary | ICD-10-CM

## 2016-03-13 NOTE — Telephone Encounter (Signed)
New message   Pt is calling because he was told at visit 12/15/15 that he is to come in in 6 months for follow up  But after arriving home he states his papers from office stated that he is to return in 1 year pt  Wants rn to call to clarify

## 2016-03-13 NOTE — Telephone Encounter (Signed)
Dr Percival Spanish had given verbal orders for pt to f/u in 1 yr.  Pt would prefer 6 months.  Will change to 6 months at the pt's request.  He is very anxious about waiting 1 yr.

## 2016-03-13 NOTE — Telephone Encounter (Signed)
Message routed to P. Raul Del, RN as patient was just seen in [place]

## 2016-03-13 NOTE — Progress Notes (Signed)
HPI The patient presents for followup after a acute inferior wall infarct in May of 2014. He had thrombectomy of a right coronary artery occlusion. He was initially acute respiratory failure and was cooled. However, he responded very well to therapy.   Earlier this year the patient was having chest pain.  He was sent for cardiac catheterization. This demonstrated severe LAD stenosis and RCA stenosis. He subsequently underwent a CABG 3 on February 17. His postoperative course was relatively unremarkable given his advanced age.  This is his third appt post OP. Since I saw him he started rehab.  He is doing relatively well.  He has had no acute complaints.  He still has some dyspnea but not severe and no PND or orthopnea.   He denies any fever or chills.  He has had no cough.    Allergies  Allergen Reactions  . Lipitor [Atorvastatin] Other (See Comments)    Muscle aches    Prior to Admission medications   Medication Sig Start Date End Date Taking? Authorizing Provider  aspirin 81 MG tablet Take 1 tablet (81 mg total) by mouth daily. 03/18/13  Yes Rhonda G Barrett, PA-C  Bioflavonoid Products (BIOFLEX) TABS Take 1 tablet by mouth daily.   Yes Historical Provider, MD  carvedilol (COREG) 3.125 MG tablet Take 1 tablet (3.125 mg total) by mouth 2 (two) times daily with a meal. 12/14/15  Yes Erin R Barrett, PA-C  ferrous gluconate (FERGON) 324 MG tablet Take 324 mg by mouth daily with breakfast.   Yes Historical Provider, MD  levothyroxine (SYNTHROID, LEVOTHROID) 75 MCG tablet TAKE 1 TABLET DAILY 03/13/15  Yes Chipper Herb, MD  Multiple Vitamin (MULITIVITAMIN WITH MINERALS) TABS Take 1 tablet by mouth daily.   Yes Historical Provider, MD  pantoprazole (PROTONIX) 40 MG tablet Take 1 tablet (40 mg total) by mouth daily. Patient not taking: Reported on 12/27/2015 10/18/15   Leanor Kail, PA     Past Medical History  Diagnosis Date  . Hypothyroidism   . Hypertension   . Rectum cancer ([place]) 1986    . PC (prostate cancer) ([place]) 1987  . Renal cell cancer ([place])   . Osteoporosis   . Esophageal stricture   . Anemia   . CAD (coronary artery disease)     RCA throbectomy 2014  . Chronic renal insufficiency, stage III (moderate)   . Myocardial infarction ([place]) 03/12/13    Inferior MI.  LAD mid 80% stenosis, circ mild plaque, RCA 100% with thrombectomy PTCA.  EF 65%    Past Surgical History  Procedure Laterality Date  . Kidney surgery  2004  . Radiofrequency ablation kidney  2006  . Hernia repair      2007  . Renal sphincter appliance  2007  . Colonoscopy  01/08/2012    Procedure: COLONOSCOPY;  Surgeon: Rogene Houston, MD;  [facility];  Service: Endoscopy;  Laterality: N/A;  1030  . Colostomy Left 1986    colon cancer  . Percutaneous coronary intervention-balloon only    . Left heart catheterization with coronary angiogram N/A 03/12/2013    Procedure: STEMI;  Surgeon: Burnell Blanks, MD;  Location: [facility] CATH LAB;  Service: Cardiovascular;  Laterality: N/A;  . Prostatectomy    . Right inguinal hernia repair    . Colonoscopy N/A 07/20/2015    Procedure: COLONOSCOPY;  Surgeon: Rogene Houston, MD;  [facility];  Service: Endoscopy;  Laterality: N/A;  930 - moved to 9/29 @ 2:00  .  Cardiac catheterization N/A 12/06/2015    Procedure: Left Heart Cath and Coronary Angiography;  Surgeon: Burnell Blanks, MD;  Location: [facility] CV LAB;  Service: Cardiovascular;  Laterality: N/A;  . Coronary artery bypass graft N/A 12/08/2015    Procedure: CORONARY ARTERY BYPASS GRAFTING (CABG);  Surgeon: Melrose Nakayama, MD;  Location: [facility];  Service: Open Heart Surgery;  Laterality: N/A;  Times 3 using left internal mammary artery and endoscopycally harvested bilateral saphenous vein.  Marland Kitchen Tee without cardioversion N/A 12/08/2015    Procedure: TRANSESOPHAGEAL ECHOCARDIOGRAM (TEE);  Surgeon: Melrose Nakayama, MD;  Location: [facility];  Service: Open Heart Surgery;   Laterality: N/A;    ROS:  As stated in the HPI and negative for all other systems.  PHYSICAL EXAM BP 150/80 mmHg  Pulse 38  Ht 5\' 6"  (1.676 m)  Wt 168 lb (76.204 kg)  BMI 27.13 kg/m2 GENERAL:  Well appearing, uncomfortable from his back NECK:  No jugular venous distention, waveform within normal limits, carotid upstroke brisk and symmetric, no bruits, no thyromegaly CHEST: Healed sternotomy scar. LUNGS:  Clear to auscultation bilaterally BACK:  No CVA tenderness, mild lordosis CHEST:  Unremarkable HEART:  PMI not displaced or sustained,S1 and S2 within normal limits, no S3, no S4, no clicks, no rubs, 3/6 apical systolic murmur radiating out the outflow tract, no diastolic murmurs ABD:  Flat, positive bowel sounds normal in frequency in pitch, no bruits, no rebound, no guarding, no midline pulsatile mass, no hepatomegaly, no splenomegaly, , colostomy bag in place EXT:  2 plus pulses throughout, moderate bilateral leg edema, no cyanosis no clubbing   ASSESSMENT AND PLAN  CAD/CABG:  He continues to recover.   He is doing well and will continue rehab.  No change in therapy.  HTN:  The blood pressure is mildly elevated but this is unusual.  We can watch this at rehab. No change in medications is indicated. We will continue with therapeutic lifestyle changes (TLC).  DYSLIPIDEMIA:  He has been intolerant of 1 statin in the past. I will consider trying a different one in the future.   However, given the slow recovery I will not start this yet.   AORTIC STENOSIS:  This is moderate and he will be followed clinically.    EDEMA:  He is instructed to take his Lasix for the next three days and keep his legs up.

## 2016-03-13 NOTE — Patient Instructions (Signed)
Medication Instructions:  The current medical regimen is effective;  continue present plan and medications.  Follow-Up: Follow up in 1 year with Dr. Hochrein in [name].  You will receive a letter in the mail 2 months before you are due.  Please call us when you receive this letter to schedule your follow up appointment.  If you need a refill on your cardiac medications before your next appointment, please call your pharmacy.  Thank you for choosing [facility]!!     

## 2016-03-15 ENCOUNTER — Encounter (HOSPITAL_COMMUNITY)
Admission: RE | Admit: 2016-03-15 | Discharge: 2016-03-15 | Disposition: A | Discharge status: Home / Self Care | Payer: Medicare Other | Source: Ambulatory Visit | Attending: Cardiology | Admitting: Cardiology

## 2016-03-15 DIAGNOSIS — Z951 Presence of aortocoronary bypass graft: Secondary | ICD-10-CM | POA: Diagnosis not present

## 2016-03-15 NOTE — Progress Notes (Signed)
Daily Session Note  Patient Details  Name: DIONTRE HARPS MRN: [id number] Date of Birth: 19-Jun-1923 Referring Provider:        [facility] [facility] from 02/23/2016 in [place]   Referring Provider  Dr. Percival Spanish      Encounter Date: 03/15/2016  Check In:     Session Check In - 03/15/16 0815    Check-In   Location [facility]   Staff Present Russella Dar, MS, EP, [facility], Exercise Physiologist;Christy Oletta Lamas, RN, BSN;Other  Nils Flack, EP   Supervising physician immediately available to respond to emergencies See telemetry face sheet for immediately available MD   Medication changes reported     No   Fall or balance concerns reported    No   Warm-up and Cool-down Performed as group-led instruction   Resistance Training Performed Yes   VAD Patient? No   Pain Assessment   Currently in Pain? No/denies   Multiple Pain Sites No      Capillary Blood Glucose: No results found for this or any previous visit (from the past 24 hour(s)).   Goals Met:  Independence with exercise equipment Exercise tolerated well No report of cardiac concerns or symptoms Strength training completed today  Goals Unmet:  RPE BP HR  Comments: Patient is progressing appropriately. Check out: 0930   Dr. Kate Sable is Medical Director for [place] and Pulmonary Rehab.

## 2016-03-16 NOTE — Progress Notes (Signed)
Cardiac Individual Treatment Plan  Patient Details  Name: Frederick Davis MRN: [id number] Date of Birth: [birth year] Referring Provider:        CARDIAC REHAB PHASE II ORIENTATION from 02/23/2016 in [facility] CARDIAC REHABILITATION   Referring Provider  Dr. Antoine Poche      Initial Encounter Date:       CARDIAC REHAB PHASE II ORIENTATION from 02/23/2016 in [place] CARDIAC REHABILITATION   Date  02/23/16   Referring Provider  Dr. Antoine Poche      Visit Diagnosis: S/P CABG x 3  Patient's Home Medications on Admission:  Current outpatient prescriptions:  .  acetaminophen (TYLENOL) 500 MG tablet, Take 500 mg by mouth at bedtime., Disp: , Rfl:  .  aspirin 81 MG tablet, Take 1 tablet (81 mg total) by mouth daily., Disp: 30 tablet, Rfl:  .  carvedilol (COREG) 3.125 MG tablet, Take 1 tablet (3.125 mg total) by mouth 2 (two) times daily with a meal., Disp: 60 tablet, Rfl: 3 .  furosemide (LASIX) 20 MG tablet, TAKE 1 TABLET DAILY (Patient taking differently: daily prn), Disp: 30 tablet, Rfl: 0 .  levothyroxine (SYNTHROID, LEVOTHROID) 75 MCG tablet, Take 75 mcg by mouth daily before breakfast., Disp: , Rfl:  .  nitroGLYCERIN (NITROSTAT) 0.4 MG SL tablet, PLACE 1 TABLET UNDER THE TONGUE AT ONSET OF CHEST PAIN EVERY 5 MINTUES UP TO 3 TIMES AS NEEDED, Disp: 25 tablet, Rfl: 0 No current facility-administered medications for this encounter.  Facility-Administered Medications Ordered in Other Encounters:  .  etomidate (AMIDATE) injection, , , PRN, [name], CRNA, 10 mg at 03/12/13 1541 .  lidocaine (cardiac) 100 mg/31ml (XYLOCAINE) 20 MG/ML injection 2%, , , PRN, [name], CRNA, 50 mg at 12/08/15 0853 .  succinylcholine (ANECTINE) injection, , , PRN, [name], CRNA, 100 mg at 03/12/13 1541  Past Medical History: Past Medical History  Diagnosis Date  . Hypothyroidism   . Hypertension   . Rectum cancer ([facility]) 1986  . PC (prostate cancer) ([facility]) 1987  . Renal cell cancer ([facility])   .  Osteoporosis   . Esophageal stricture   . Anemia   . CAD (coronary artery disease)     RCA throbectomy 2014  . Chronic renal insufficiency, stage III (moderate)   . Myocardial infarction ([facility]) 03/12/13    Inferior MI.  LAD mid 80% stenosis, circ mild plaque, RCA 100% with thrombectomy PTCA.  EF 65%    Tobacco Use: History  Smoking status  . Former Smoker -- 0.50 packs/day for 10 years  . Types: Cigarettes  . Start date: 10/21/1946  . Quit date: 10/21/1968  Smokeless tobacco  . Never Used    Labs:     Recent Review Flowsheet Data    Labs for ITP Cardiac and Pulmonary Rehab Latest Ref Rng 12/08/2015 12/08/2015 12/09/2015 12/09/2015 02/09/2016   Cholestrol 100 - 199 mg/dL - - - - 528   HDL >41 mg/dL - - - - 66   Trlycerides 0 - 149 mg/dL - - - - 87   PHART 3.244 - 7.450 7.287(L) - - - -   PCO2ART 35.0 - 45.0 mmHg 45.0 - - - -   HCO3 20.0 - 24.0 mEq/L 21.6 - - - -   TCO2 0 - 100 mmol/L 23 22 22 23  -   ACIDBASEDEF 0.0 - 2.0 mmol/L 5.0(H) - - - -   O2SAT - 99.0 - - - -      Capillary Blood Glucose: Lab Results  Component Value Date   GLUCAP 86 12/12/2015   GLUCAP 83 12/11/2015   GLUCAP 118* 12/11/2015   GLUCAP 95 12/11/2015   GLUCAP 87 12/11/2015     Exercise Target Goals:    Exercise Program Goal: Individual exercise prescription set with THRR, safety & activity barriers. Participant demonstrates ability to understand and report RPE using BORG scale, to self-measure pulse accurately, and to acknowledge the importance of the exercise prescription.  Exercise Prescription Goal: Starting with aerobic activity 30 plus minutes a day, 3 days per week for initial exercise prescription. Provide home exercise prescription and guidelines that participant acknowledges understanding prior to discharge.  Activity Barriers & Risk Stratification:     Activity Barriers & Cardiac Risk Stratification - 02/23/16 1412    Activity Barriers & Cardiac Risk Stratification   Activity  Barriers Deconditioning;Balance Concerns;Left Knee Replacement   Cardiac Risk Stratification High      6 Minute Walk:     6 Minute Walk      02/23/16 1818       6 Minute Walk   Phase Initial     Distance 900 feet     Walk Time 6 minutes     # of Rest Breaks 0     MPH 1.7     METS 2.3     RPE 12     Perceived Dyspnea  13     VO2 Peak 5.59     Symptoms No     Resting HR 56 bpm     Resting BP 112/80 mmHg     Max Ex. HR 70 bpm     Max Ex. BP 148/90 mmHg     2 Minute Post BP 138/86 mmHg        Initial Exercise Prescription:     Initial Exercise Prescription - 02/23/16 1800    Date of Initial Exercise RX and Referring Provider   Date 02/23/16   Referring Provider Dr. Antoine Poche   NuStep   Level 2   Watts 25   Minutes 15   METs 1.9   Arm Ergometer   Level 2.5   Watts 16   Minutes 15   METs 2.5   Prescription Details   Frequency (times per week) 3   Duration Progress to 30 minutes of continuous aerobic without signs/symptoms of physical distress   Intensity   THRR REST +  30   THRR 40-80% of Max Heartrate 85-100-114   Ratings of Perceived Exertion 11-13   Perceived Dyspnea 0-4   Progression   Progression Continue to progress workloads to maintain intensity without signs/symptoms of physical distress.   Resistance Training   Training Prescription Yes   Weight 1   Reps 10-12      Perform Capillary Blood Glucose checks as needed.  Exercise Prescription Changes:      Exercise Prescription Changes      03/16/16 1200           Exercise Review   Progression Yes       Response to Exercise   Blood Pressure (Admit) 138/70 mmHg       Blood Pressure (Exercise) 162/90 mmHg       Blood Pressure (Exit) 118/62 mmHg       Heart Rate (Admit) 70 bpm       Heart Rate (Exercise) 107 bpm       Heart Rate (Exit) 79 bpm       Rating of Perceived Exertion (Exercise) 13  Symptoms No       Duration Progress to 30 minutes of continuous aerobic without  signs/symptoms of physical distress       Intensity Rest + 30       Progression   Progression Continue to progress workloads to maintain intensity without signs/symptoms of physical distress.       Resistance Training   Training Prescription Yes       Weight 1       Reps 10-12       NuStep   Level 2       Watts 20       Minutes 15       METs 2       Arm Ergometer   Level 2.6       Watts 23       Minutes 15       METs 2.7       Home Exercise Plan   Plans to continue exercise at Home       Frequency Add 2 additional days to program exercise sessions.          Exercise Comments:    Discharge Exercise Prescription (Final Exercise Prescription Changes):     Exercise Prescription Changes - 03/16/16 1200    Exercise Review   Progression Yes   Response to Exercise   Blood Pressure (Admit) 138/70 mmHg   Blood Pressure (Exercise) 162/90 mmHg   Blood Pressure (Exit) 118/62 mmHg   Heart Rate (Admit) 70 bpm   Heart Rate (Exercise) 107 bpm   Heart Rate (Exit) 79 bpm   Rating of Perceived Exertion (Exercise) 13   Symptoms No   Duration Progress to 30 minutes of continuous aerobic without signs/symptoms of physical distress   Intensity Rest + 30   Progression   Progression Continue to progress workloads to maintain intensity without signs/symptoms of physical distress.   Resistance Training   Training Prescription Yes   Weight 1   Reps 10-12   NuStep   Level 2   Watts 20   Minutes 15   METs 2   Arm Ergometer   Level 2.6   Watts 23   Minutes 15   METs 2.7   Home Exercise Plan   Plans to continue exercise at Home   Frequency Add 2 additional days to program exercise sessions.      Nutrition:  Target Goals: Understanding of nutrition guidelines, daily intake of sodium 1500mg , cholesterol 200mg , calories 30% from fat and 7% or less from saturated fats, daily to have 5 or more servings of fruits and vegetables.  Biometrics:     Pre Biometrics - 02/23/16 1829     Pre Biometrics   Height 5\' 6"  (1.676 m)   Weight 163 lb 11.2 oz (74.254 kg)   Waist Circumference 36 inches   Hip Circumference 38 inches   Waist to Hip Ratio 0.95 %   BMI (Calculated) 26.5   Triceps Skinfold 5 mm   % Body Fat 21.8 %   Grip Strength 60.6 kg   Flexibility 0 in   Single Leg Stand 5 seconds       Nutrition Therapy Plan and Nutrition Goals:   Nutrition Discharge: Rate Your Plate Scores:     Nutrition Assessments - 02/23/16 1516    MEDFICTS Scores   Pre Score 67      Nutrition Goals Re-Evaluation:   Psychosocial: Target Goals: Acknowledge presence or absence of depression, maximize coping skills, provide positive support system.  Participant is able to verbalize types and ability to use techniques and skills needed for reducing stress and depression.  Initial Review & Psychosocial Screening:     Initial Psych Review & Screening - 02/23/16 1516    Family Dynamics   Good Support System? Yes   Barriers   Psychosocial barriers to participate in program There are no identifiable barriers or psychosocial needs.   Screening Interventions   Interventions Encouraged to exercise      Quality of Life Scores:     Quality of Life - 02/23/16 1831    Quality of Life Scores   Health/Function Pre 23.63 %   Socioeconomic Pre 29.17 %   Psych/Spiritual Pre 28.93 %   Family Pre 30 %   GLOBAL Pre 27.03 %      PHQ-9:     Recent Review Flowsheet Data    Depression screen [facility] 2/9 02/23/2016 02/13/2016 11/07/2015 11/01/2015 10/26/2015   Decreased Interest 0 0 0 0 0   Down, Depressed, Hopeless 0 0 0 0 0   PHQ - 2 Score 0 0 0 0 0   Altered sleeping 0 - - - -   Tired, decreased energy 0 - - - -   Change in appetite 1 - - - -   Feeling bad or failure about yourself  0 - - - -   Trouble concentrating 1 - - - -   Moving slowly or fidgety/restless 0 - - - -   Suicidal thoughts 0 - - - -   PHQ-9 Score 2 - - - -   Difficult doing work/chores Not difficult at all - - - -       Psychosocial Evaluation and Intervention:     Psychosocial Evaluation - 02/23/16 1517    Psychosocial Evaluation & Interventions   Interventions Relaxation education;Stress management education;Encouraged to exercise with the program and follow exercise prescription   Continued Psychosocial Services Needed No      Psychosocial Re-Evaluation:     Psychosocial Re-Evaluation      03/11/16 1423           Psychosocial Re-Evaluation   Interventions Encouraged to attend Cardiac Rehabilitation for the exercise       Continued Psychosocial Services Needed No          Vocational Rehabilitation: Provide vocational rehab assistance to qualifying candidates.   Vocational Rehab Evaluation & Intervention:     Vocational Rehab - 02/23/16 1514    Initial Vocational Rehab Evaluation & Intervention   Assessment shows need for Vocational Rehabilitation No      Education: Education Goals: Education classes will be provided on a weekly basis, covering required topics. Participant will state understanding/return demonstration of topics presented.  Learning Barriers/Preferences:     Learning Barriers/Preferences - 02/23/16 1513    Learning Barriers/Preferences   Learning Barriers None   Learning Preferences Written Material      Education Topics: Hypertension, Hypertension Reduction -Define heart disease and high blood pressure. Discus how high blood pressure affects the body and ways to reduce high blood pressure.   Exercise and Your Heart -Discuss why it is important to exercise, the FITT principles of exercise, normal and abnormal responses to exercise, and how to exercise safely.   Angina -Discuss definition of angina, causes of angina, treatment of angina, and how to decrease risk of having angina.          CARDIAC REHAB PHASE II EXERCISE from 03/06/2016 in [facility] CARDIAC REHABILITATION   Date  03/06/16   Educator  -- [D Adrionna Delcid]   Instruction Review Code  2-  meets goals/outcomes      Cardiac Medications -Review what the following cardiac medications are used for, how they affect the body, and side effects that may occur when taking the medications.  Medications include Aspirin, Beta blockers, calcium channel blockers, ACE Inhibitors, angiotensin receptor blockers, diuretics, digoxin, and antihyperlipidemics.   Congestive Heart Failure -Discuss the definition of CHF, how to live with CHF, the signs and symptoms of CHF, and how keep track of weight and sodium intake.   Heart Disease and Intimacy -Discus the effect sexual activity has on the heart, how changes occur during intimacy as we age, and safety during sexual activity.   Smoking Cessation / COPD -Discuss different methods to quit smoking, the health benefits of quitting smoking, and the definition of COPD.   Nutrition I: Fats -Discuss the types of cholesterol, what cholesterol does to the heart, and how cholesterol levels can be controlled.   Nutrition II: Labels -Discuss the different components of food labels and how to read food label   Heart Parts and Heart Disease -Discuss the anatomy of the heart, the pathway of blood circulation through the heart, and these are affected by heart disease.   Stress I: Signs and Symptoms -Discuss the causes of stress, how stress may lead to anxiety and depression, and ways to limit stress.   Stress II: Relaxation -Discuss different types of relaxation techniques to limit stress.   Warning Signs of Stroke / TIA -Discuss definition of a stroke, what the signs and symptoms are of a stroke, and how to identify when someone is having stroke.   Knowledge Questionnaire Score:     Knowledge Questionnaire Score - 02/23/16 1514    Knowledge Questionnaire Score   Pre Score 20/24      Core Components/Risk Factors/Patient Goals at Admission:     Personal Goals and Risk Factors at Admission - 02/23/16 1516    Core Components/Risk  Factors/Patient Goals on Admission   Improve shortness of breath with ADL's Yes   Intervention Provide education, individualized exercise plan and daily activity instruction to help decrease symptoms of SOB with activities of daily living.   Expected Outcomes Short Term: Achieves a reduction of symptoms when performing activities of daily living.      Core Components/Risk Factors/Patient Goals Review:      Goals and Risk Factor Review      02/23/16 1516 03/11/16 1422         Core Components/Risk Factors/Patient Goals Review   Personal Goals Review Improve shortness of breath with ADL's Improve shortness of breath with ADL's      Review  Patient is progressing well in the program.  No obvious SOB noted with exercise.       Expected Outcomes  decrease SOB with ADL's         Core Components/Risk Factors/Patient Goals at Discharge (Final Review):      Goals and Risk Factor Review - 03/11/16 1422    Core Components/Risk Factors/Patient Goals Review   Personal Goals Review Improve shortness of breath with ADL's   Review Patient is progressing well in the program.  No obvious SOB noted with exercise.    Expected Outcomes decrease SOB with ADL's      ITP Comments:     ITP Comments      02/23/16 1514           ITP Comments Pt is a 82  year old male living with spouse and enjoys playing golf.  Patient does not exhibit any s/s of depression at this time.           Comments: Patient is doing well with program. Will continue to monitor for progress.

## 2016-03-18 ENCOUNTER — Encounter (HOSPITAL_COMMUNITY): Payer: Medicare Other

## 2016-03-20 ENCOUNTER — Encounter (HOSPITAL_COMMUNITY)
Admission: RE | Admit: 2016-03-20 | Discharge: 2016-03-20 | Disposition: A | Discharge status: Home / Self Care | Payer: Medicare Other | Source: Ambulatory Visit | Attending: Cardiology | Admitting: Cardiology

## 2016-03-20 DIAGNOSIS — Z951 Presence of aortocoronary bypass graft: Secondary | ICD-10-CM | POA: Diagnosis not present

## 2016-03-20 NOTE — Progress Notes (Signed)
Daily Session Note  Patient Details  Name: Frederick Davis MRN: [id number] Date of Birth: 11/10/1922 Referring Provider:        CARDIAC REHAB PHASE II ORIENTATION from 02/23/2016 in [place]   Referring Provider  Dr. Percival Spanish      Encounter Date: 03/20/2016  Check In:     Session Check In - 03/20/16 0816    Check-In   Location [facility]   Staff Present Russella Dar, MS, EP, [facility], Exercise Physiologist;Lowanda Cashaw Oletta Lamas, RN, BSN;Other  [place] physician immediately available to respond to emergencies See telemetry face sheet for immediately available MD   Medication changes reported     No   Fall or balance concerns reported    No   Warm-up and Cool-down Performed as group-led instruction   Resistance Training Performed Yes   VAD Patient? No   Pain Assessment   Currently in Pain? No/denies   Multiple Pain Sites No      Capillary Blood Glucose: No results found for this or any previous visit (from the past 24 hour(s)).   Goals Met:  Independence with exercise equipment Exercise tolerated well No report of cardiac concerns or symptoms Strength training completed today  Goals Unmet:  Not Applicable  Comments: check out 0915   Dr. Kate Sable is Medical Director for [place] and Pulmonary Rehab.

## 2016-03-22 ENCOUNTER — Encounter (HOSPITAL_COMMUNITY)
Admission: RE | Admit: 2016-03-22 | Discharge: 2016-03-22 | Disposition: A | Discharge status: Home / Self Care | Payer: Medicare Other | Source: Ambulatory Visit | Attending: Cardiology | Admitting: Cardiology

## 2016-03-22 DIAGNOSIS — Z951 Presence of aortocoronary bypass graft: Secondary | ICD-10-CM | POA: Insufficient documentation

## 2016-03-22 NOTE — Progress Notes (Signed)
Daily Session Note  Patient Details  Name: Frederick Davis MRN: [id number] Date of Birth: 08/12/1923 Referring Provider:        CARDIAC REHAB PHASE II ORIENTATION from 02/23/2016 in [place]   Referring Provider  Dr. Percival Spanish      Encounter Date: 03/22/2016  Check In:     Session Check In - 03/22/16 0951    Check-In   Location [facility]   Staff Present Russella Dar, MS, EP, [facility], Exercise Physiologist;Christy Oletta Lamas, RN, BSN;Other   Supervising physician immediately available to respond to emergencies See telemetry face sheet for immediately available MD   Medication changes reported     No   Fall or balance concerns reported    No   Warm-up and Cool-down Performed as group-led instruction   Resistance Training Performed Yes   VAD Patient? No   Pain Assessment   Currently in Pain? No/denies   Multiple Pain Sites No      Capillary Blood Glucose: No results found for this or any previous visit (from the past 24 hour(s)).   Goals Met:  Independence with exercise equipment Exercise tolerated well No report of cardiac concerns or symptoms Strength training completed today  Goals Unmet:  Not Applicable  Comments: Check out: 9:15   Dr. Kate Sable is Medical Director for [place] and Pulmonary Rehab.

## 2016-03-25 ENCOUNTER — Encounter (HOSPITAL_COMMUNITY)
Admission: RE | Admit: 2016-03-25 | Discharge: 2016-03-25 | Disposition: A | Discharge status: Home / Self Care | Payer: Medicare Other | Source: Ambulatory Visit | Attending: Cardiology | Admitting: Cardiology

## 2016-03-25 DIAGNOSIS — Z951 Presence of aortocoronary bypass graft: Secondary | ICD-10-CM | POA: Diagnosis not present

## 2016-03-25 NOTE — Progress Notes (Signed)
Daily Session Note  Patient Details  Name: Frederick Davis MRN: [id number] Date of Birth: Jan 04, 1923 Referring Provider:        [place] from 02/23/2016 in [place]   Referring Provider  Dr. Percival Spanish      Encounter Date: 03/25/2016  Check In:     Session Check In - 03/25/16 0815    Check-In   Location [facility]   Staff Present Russella Dar, MS, EP, [facility], Exercise Physiologist;Christy Oletta Lamas, RN, BSN;Other  Nils Flack, EP   Supervising physician immediately available to respond to emergencies See telemetry face sheet for immediately available MD   Medication changes reported     No   Fall or balance concerns reported    No   Warm-up and Cool-down Performed as group-led instruction   Resistance Training Performed Yes   VAD Patient? No   Pain Assessment   Currently in Pain? No/denies   Multiple Pain Sites No      Capillary Blood Glucose: No results found for this or any previous visit (from the past 24 hour(s)).   Goals Met:  Independence with exercise equipment Exercise tolerated well No report of cardiac concerns or symptoms Strength training completed today  Goals Unmet:  Not Applicable  Comments: Check out: 9:15   Dr. Kate Sable is Medical Director for [place] and Pulmonary Rehab.

## 2016-03-27 ENCOUNTER — Encounter (HOSPITAL_COMMUNITY)
Admission: RE | Admit: 2016-03-27 | Discharge: 2016-03-27 | Disposition: A | Discharge status: Home / Self Care | Payer: Medicare Other | Source: Ambulatory Visit | Attending: Cardiology | Admitting: Cardiology

## 2016-03-27 DIAGNOSIS — Z951 Presence of aortocoronary bypass graft: Secondary | ICD-10-CM | POA: Diagnosis not present

## 2016-03-27 NOTE — Progress Notes (Signed)
Daily Session Note  Patient Details  Name: Frederick Davis MRN: [id number] Date of Birth: [birth year] Referring Provider:        [place] from 02/23/2016 in [place]   Referring Provider  Dr. Percival Spanish      Encounter Date: 03/27/2016  Check In:     Session Check In - 03/27/16 0842    Check-In   Location [facility]   Staff Present Russella Dar, MS, EP, [facility], Exercise Physiologist;Christy Oletta Lamas, RN, BSN;Other  [facility] physician immediately available to respond to emergencies See telemetry face sheet for immediately available MD   Medication changes reported     No   Fall or balance concerns reported    No   Warm-up and Cool-down Performed as group-led instruction   Resistance Training Performed Yes   VAD Patient? No   Pain Assessment   Currently in Pain? No/denies   Multiple Pain Sites No      Capillary Blood Glucose: No results found for this or any previous visit (from the past 24 hour(s)).   Goals Met:  Independence with exercise equipment Exercise tolerated well No report of cardiac concerns or symptoms Strength training completed today  Goals Unmet:  Not Applicable  Comments: Check out 0915   Dr. Kate Sable is Medical Director for [place] and Pulmonary Rehab.

## 2016-03-29 ENCOUNTER — Encounter (HOSPITAL_COMMUNITY)
Admission: RE | Admit: 2016-03-29 | Discharge: 2016-03-29 | Disposition: A | Discharge status: Home / Self Care | Payer: Medicare Other | Source: Ambulatory Visit | Attending: Cardiology | Admitting: Cardiology

## 2016-03-29 DIAGNOSIS — Z951 Presence of aortocoronary bypass graft: Secondary | ICD-10-CM | POA: Diagnosis not present

## 2016-03-29 NOTE — Progress Notes (Signed)
Daily Session Note  Patient Details  Name: Frederick Davis MRN: [id number] Date of Birth: Jun 04, 1923 Referring Provider:        CARDIAC REHAB PHASE II ORIENTATION from 02/23/2016 in [place]   Referring Provider  Dr. Percival Spanish      Encounter Date: 03/29/2016  Check In:     Session Check In - 03/29/16 0815    Check-In   Location [facility]   Staff Present Russella Dar, MS, EP, [facility], Exercise Physiologist;Christy Oletta Lamas, RN, BSN;Gregory Cowan, BS, EP, Exercise Physiologist   Supervising physician immediately available to respond to emergencies See telemetry face sheet for immediately available MD   Medication changes reported     No   Fall or balance concerns reported    No   Warm-up and Cool-down Performed as group-led instruction   Resistance Training Performed Yes   VAD Patient? No   Pain Assessment   Currently in Pain? No/denies   Multiple Pain Sites No      Capillary Blood Glucose: No results found for this or any previous visit (from the past 24 hour(s)).   Goals Met:  Independence with exercise equipment Exercise tolerated well No report of cardiac concerns or symptoms Strength training completed today  Goals Unmet:  Not Applicable  Comments: Check out: 915   Dr. Kate Sable is Medical Director for [place] and Pulmonary Rehab.

## 2016-04-01 ENCOUNTER — Encounter (HOSPITAL_COMMUNITY)
Admission: RE | Admit: 2016-04-01 | Discharge: 2016-04-01 | Disposition: A | Discharge status: Home / Self Care | Payer: Medicare Other | Source: Ambulatory Visit | Attending: Cardiology | Admitting: Cardiology

## 2016-04-01 DIAGNOSIS — Z951 Presence of aortocoronary bypass graft: Secondary | ICD-10-CM | POA: Diagnosis not present

## 2016-04-01 NOTE — Progress Notes (Signed)
Daily Session Note  Patient Details  Name: Frederick Davis MRN: [id number] Date of Birth: 1923-05-04 Referring Provider:        CARDIAC REHAB PHASE II ORIENTATION from 02/23/2016 in [place]   Referring Provider  Dr. Percival Spanish      Encounter Date: 04/01/2016  Check In:     Session Check In - 04/01/16 0828    Check-In   Location [facility]   Staff Present Russella Dar, MS, EP, [facility], Exercise Physiologist;Jehan Ranganathan Oletta Lamas, RN, BSN;Gregory Cowan, BS, EP, Exercise Physiologist   Supervising physician immediately available to respond to emergencies See telemetry face sheet for immediately available MD   Medication changes reported     No   Fall or balance concerns reported    No   Warm-up and Cool-down Performed as group-led instruction   Resistance Training Performed Yes   VAD Patient? No   Pain Assessment   Currently in Pain? No/denies      Capillary Blood Glucose: No results found for this or any previous visit (from the past 24 hour(s)).   Goals Met:  Independence with exercise equipment Exercise tolerated well No report of cardiac concerns or symptoms Strength training completed today  Goals Unmet:  Not Applicable  Comments: check out 0915   Dr. Kate Sable is Medical Director for [place] and Pulmonary Rehab.

## 2016-04-02 ENCOUNTER — Other Ambulatory Visit: Payer: Self-pay | Admitting: Thoracic Surgery (Cardiothoracic Vascular Surgery)

## 2016-04-03 ENCOUNTER — Encounter (HOSPITAL_COMMUNITY)
Admission: RE | Admit: 2016-04-03 | Discharge: 2016-04-03 | Disposition: A | Discharge status: Home / Self Care | Payer: Medicare Other | Source: Ambulatory Visit | Attending: Cardiology | Admitting: Cardiology

## 2016-04-03 ENCOUNTER — Other Ambulatory Visit: Payer: Self-pay | Admitting: Cardiology

## 2016-04-03 DIAGNOSIS — Z951 Presence of aortocoronary bypass graft: Secondary | ICD-10-CM | POA: Diagnosis not present

## 2016-04-03 MED ORDER — FUROSEMIDE 20 MG PO TABS: ORAL_TABLET | ORAL | Status: DC

## 2016-04-03 NOTE — Telephone Encounter (Signed)
Rx request sent to pharmacy.  

## 2016-04-03 NOTE — Progress Notes (Signed)
Daily Session Note  Patient Details  Name: ALIE HARDGROVE MRN: [id number] Date of Birth: 1923-03-12 Referring Provider:        CARDIAC REHAB PHASE II ORIENTATION from 02/23/2016 in [place]   Referring Provider  Dr. Percival Spanish      Encounter Date: 04/03/2016  Check In:     Session Check In - 04/03/16 0815    Check-In   Location [facility]   Staff Present Diane Angelina Pih, MS, EP, [facility], Exercise Physiologist;Shahmeer Bunn Luther Parody, BS, EP, Exercise Physiologist;Christy Oletta Lamas, RN, BSN   Supervising physician immediately available to respond to emergencies See telemetry face sheet for immediately available MD   Medication changes reported     No   Fall or balance concerns reported    No   Warm-up and Cool-down Performed as group-led instruction   Resistance Training Performed Yes   VAD Patient? No      Capillary Blood Glucose: No results found for this or any previous visit (from the past 24 hour(s)).   Goals Met:  Independence with exercise equipment Exercise tolerated well No report of cardiac concerns or symptoms Strength training completed today  Goals Unmet:  Not Applicable  Comments: Check out 915   Dr. Kate Sable is Medical Director for [place] and Pulmonary Rehab.

## 2016-04-05 ENCOUNTER — Encounter (HOSPITAL_COMMUNITY)
Admission: RE | Admit: 2016-04-05 | Discharge: 2016-04-05 | Disposition: A | Discharge status: Home / Self Care | Payer: Medicare Other | Source: Ambulatory Visit | Attending: Cardiology | Admitting: Cardiology

## 2016-04-05 DIAGNOSIS — Z951 Presence of aortocoronary bypass graft: Secondary | ICD-10-CM | POA: Diagnosis not present

## 2016-04-05 NOTE — Progress Notes (Signed)
Daily Session Note  Patient Details  Name: Frederick Davis MRN: [id number] Date of Birth: 1922/12/20 Referring Provider:        CARDIAC REHAB PHASE II ORIENTATION from 02/23/2016 in [place]   Referring Provider  Dr. Percival Spanish      Encounter Date: 04/05/2016  Check In:     Session Check In - 04/05/16 0815    Check-In   Location [facility]   Staff Present Diane Angelina Pih, MS, EP, [facility], Exercise Physiologist;Edmundo Tedesco Luther Parody, BS, EP, Exercise Physiologist;Christy Oletta Lamas, RN, BSN   Supervising physician immediately available to respond to emergencies See telemetry face sheet for immediately available MD   Medication changes reported     No   Fall or balance concerns reported    No   Warm-up and Cool-down Performed as group-led instruction   Resistance Training Performed Yes   VAD Patient? No   Pain Assessment   Currently in Pain? No/denies   Multiple Pain Sites No      Capillary Blood Glucose: No results found for this or any previous visit (from the past 24 hour(s)).   Goals Met:  Independence with exercise equipment Exercise tolerated well No report of cardiac concerns or symptoms Strength training completed today  Goals Unmet:  Not Applicable  Comments: Check out 0915   Dr. Kate Sable is Medical Director for [place] and Pulmonary Rehab.

## 2016-04-08 ENCOUNTER — Encounter (HOSPITAL_COMMUNITY)
Admission: RE | Admit: 2016-04-08 | Discharge: 2016-04-08 | Disposition: A | Discharge status: Home / Self Care | Payer: Medicare Other | Source: Ambulatory Visit | Attending: Cardiology | Admitting: Cardiology

## 2016-04-08 DIAGNOSIS — Z951 Presence of aortocoronary bypass graft: Secondary | ICD-10-CM | POA: Diagnosis not present

## 2016-04-08 NOTE — Progress Notes (Signed)
Daily Session Note  Patient Details  Name: ZARON ZWIEFELHOFER MRN: [id number] Date of Birth: April 25, 1923 Referring Provider:        CARDIAC REHAB PHASE II ORIENTATION from 02/23/2016 in [place]   Referring Provider  Dr. Percival Spanish      Encounter Date: 04/08/2016  Check In:     Session Check In - 04/08/16 0815    Check-In   Location [facility]   Staff Present Diane Angelina Pih, MS, EP, [facility], Exercise Physiologist;Preslei Blakley Luther Parody, BS, EP, Exercise Physiologist;Christy Oletta Lamas, RN, BSN   Supervising physician immediately available to respond to emergencies See telemetry face sheet for immediately available MD   Medication changes reported     No   Fall or balance concerns reported    No   Warm-up and Cool-down Performed as group-led instruction   Resistance Training Performed Yes   VAD Patient? No   Pain Assessment   Currently in Pain? No/denies   Multiple Pain Sites No      Capillary Blood Glucose: No results found for this or any previous visit (from the past 24 hour(s)).   Goals Met:  Independence with exercise equipment Exercise tolerated well No report of cardiac concerns or symptoms Strength training completed today  Goals Unmet:  Not Applicable  Comments: Check out 0915   Dr. Kate Sable is Medical Director for [place] and Pulmonary Rehab.

## 2016-04-10 ENCOUNTER — Encounter (HOSPITAL_COMMUNITY): Admission: RE | Admit: 2016-04-10 | Payer: Medicare Other | Source: Ambulatory Visit

## 2016-04-10 DIAGNOSIS — C649 Malignant neoplasm of unspecified kidney, except renal pelvis: Secondary | ICD-10-CM | POA: Diagnosis not present

## 2016-04-10 DIAGNOSIS — N281 Cyst of kidney, acquired: Secondary | ICD-10-CM | POA: Diagnosis not present

## 2016-04-10 DIAGNOSIS — K449 Diaphragmatic hernia without obstruction or gangrene: Secondary | ICD-10-CM | POA: Diagnosis not present

## 2016-04-10 DIAGNOSIS — Z905 Acquired absence of kidney: Secondary | ICD-10-CM | POA: Diagnosis not present

## 2016-04-10 DIAGNOSIS — Z9889 Other specified postprocedural states: Secondary | ICD-10-CM | POA: Diagnosis not present

## 2016-04-10 DIAGNOSIS — Z8546 Personal history of malignant neoplasm of prostate: Secondary | ICD-10-CM | POA: Diagnosis not present

## 2016-04-10 DIAGNOSIS — Z85528 Personal history of other malignant neoplasm of kidney: Secondary | ICD-10-CM | POA: Diagnosis not present

## 2016-04-10 DIAGNOSIS — K8020 Calculus of gallbladder without cholecystitis without obstruction: Secondary | ICD-10-CM | POA: Diagnosis not present

## 2016-04-10 DIAGNOSIS — N289 Disorder of kidney and ureter, unspecified: Secondary | ICD-10-CM | POA: Diagnosis not present

## 2016-04-10 DIAGNOSIS — K862 Cyst of pancreas: Secondary | ICD-10-CM | POA: Diagnosis not present

## 2016-04-10 DIAGNOSIS — Z923 Personal history of irradiation: Secondary | ICD-10-CM | POA: Diagnosis not present

## 2016-04-12 ENCOUNTER — Encounter (HOSPITAL_COMMUNITY): Payer: Medicare Other

## 2016-04-15 ENCOUNTER — Encounter (HOSPITAL_COMMUNITY)
Admission: RE | Admit: 2016-04-15 | Discharge: 2016-04-15 | Disposition: A | Discharge status: Home / Self Care | Payer: Medicare Other | Source: Ambulatory Visit | Attending: Cardiology | Admitting: Cardiology

## 2016-04-15 DIAGNOSIS — Z951 Presence of aortocoronary bypass graft: Secondary | ICD-10-CM | POA: Diagnosis not present

## 2016-04-15 NOTE — Progress Notes (Signed)
Daily Session Note  Patient Details  Name: Frederick Davis MRN: [id number] Date of Birth: 11-24-1922 Referring Provider:        [place] from 02/23/2016 in [place]   Referring Provider  Dr. Percival Spanish      Encounter Date: 04/15/2016  Check In:     Session Check In - 04/15/16 0815    Check-In   Location [facility]   Staff Present Suzanne Boron, BS, EP, Exercise Physiologist;Christy Oletta Lamas, RN, BSN   Supervising physician immediately available to respond to emergencies See telemetry face sheet for immediately available MD   Medication changes reported     No   Fall or balance concerns reported    No   Warm-up and Cool-down Performed as group-led instruction   Resistance Training Performed Yes   VAD Patient? No   Pain Assessment   Currently in Pain? No/denies   Multiple Pain Sites No      Capillary Blood Glucose: No results found for this or any previous visit (from the past 24 hour(s)).   Goals Met:  Independence with exercise equipment Exercise tolerated well No report of cardiac concerns or symptoms Strength training completed today  Goals Unmet:  Not Applicable  Comments: Check out 915   Dr. Kate Sable is Medical Director for [place] and Pulmonary Rehab.

## 2016-04-15 NOTE — Progress Notes (Signed)
Cardiac Individual Treatment Plan  Patient Details  Name: Frederick Davis MRN: [id number] Date of Birth: [birth year] Referring Provider:        [place] from 02/23/2016 in [place]   Referring Provider  Dr. Percival Spanish      Initial Encounter Date:       CARDIAC REHAB PHASE II ORIENTATION from 02/23/2016 in [place]   Date  02/23/16   Referring Provider  Dr. Percival Spanish      Visit Diagnosis: No diagnosis found.  Patient's Home Medications on Admission:  Current outpatient prescriptions:  .  acetaminophen (TYLENOL) 500 MG tablet, Take 500 mg by mouth at bedtime., Disp: , Rfl:  .  aspirin 81 MG tablet, Take 1 tablet (81 mg total) by mouth daily., Disp: 30 tablet, Rfl:  .  carvedilol (COREG) 3.125 MG tablet, Take 1 tablet (3.125 mg total) by mouth 2 (two) times daily with a meal., Disp: 60 tablet, Rfl: 3 .  furosemide (LASIX) 20 MG tablet, Take 1 tablet daily as needed., Disp: 30 tablet, Rfl: 6 .  levothyroxine (SYNTHROID, LEVOTHROID) 75 MCG tablet, Take 75 mcg by mouth daily before breakfast., Disp: , Rfl:  .  nitroGLYCERIN (NITROSTAT) 0.4 MG SL tablet, PLACE 1 TABLET UNDER THE TONGUE AT ONSET OF CHEST PAIN EVERY 5 MINTUES UP TO 3 TIMES AS NEEDED, Disp: 25 tablet, Rfl: 0 No current facility-administered medications for this encounter.  Facility-Administered Medications Ordered in Other Encounters:  .  etomidate (AMIDATE) injection, , , PRN, Terrill Mohr, CRNA, 10 mg at 03/12/13 1541 .  lidocaine (cardiac) 100 mg/58ml (XYLOCAINE) 20 MG/ML injection 2%, , , PRN, Terrill Mohr, CRNA, 50 mg at 12/08/15 0853 .  succinylcholine (ANECTINE) injection, , , PRN, Terrill Mohr, CRNA, 100 mg at 03/12/13 1541  Past Medical History: Past Medical History  Diagnosis Date  . Hypothyroidism   . Hypertension   . Rectum cancer ([place]) 1986  . PC (prostate cancer) ([place]) 1987  . Renal cell cancer ([place])   . Osteoporosis   . Esophageal  stricture   . Anemia   . CAD (coronary artery disease)     RCA throbectomy 2014  . Chronic renal insufficiency, stage III (moderate)   . Myocardial infarction ([place]) 03/12/13    Inferior MI.  LAD mid 80% stenosis, circ mild plaque, RCA 100% with thrombectomy PTCA.  EF 65%    Tobacco Use: History  Smoking status  . Former Smoker -- 0.50 packs/day for 10 years  . Types: Cigarettes  . Start date: 10/21/1946  . Quit date: 10/21/1968  Smokeless tobacco  . Never Used    Labs: Recent Review Flowsheet Data    Labs for ITP Cardiac and Pulmonary Rehab Latest Ref Rng 12/08/2015 12/08/2015 12/09/2015 12/09/2015 02/09/2016   Cholestrol 100 - 199 mg/dL - - - - 142   HDL >39 mg/dL - - - - 66   Trlycerides 0 - 149 mg/dL - - - - 87   PHART 7.350 - 7.450 7.287(L) - - - -   PCO2ART 35.0 - 45.0 mmHg 45.0 - - - -   HCO3 20.0 - 24.0 mEq/L 21.6 - - - -   TCO2 0 - 100 mmol/L 23 22 22 23  -   ACIDBASEDEF 0.0 - 2.0 mmol/L 5.0(H) - - - -   O2SAT - 99.0 - - - -      Capillary Blood Glucose: Lab Results  Component Value Date   GLUCAP 22  12/12/2015   GLUCAP 83 12/11/2015   GLUCAP 118* 12/11/2015   GLUCAP 95 12/11/2015   GLUCAP 87 12/11/2015     Exercise Target Goals:    Exercise Program Goal: Individual exercise prescription set with THRR, safety & activity barriers. Participant demonstrates ability to understand and report RPE using BORG scale, to self-measure pulse accurately, and to acknowledge the importance of the exercise prescription.  Exercise Prescription Goal: Starting with aerobic activity 30 plus minutes a day, 3 days per week for initial exercise prescription. Provide home exercise prescription and guidelines that participant acknowledges understanding prior to discharge.  Activity Barriers & Risk Stratification:     Activity Barriers & Cardiac Risk Stratification - 02/23/16 1412    Activity Barriers & Cardiac Risk Stratification   Activity Barriers Deconditioning;Balance  Concerns;Left Knee Replacement   Cardiac Risk Stratification High      6 Minute Walk:     6 Minute Walk      02/23/16 1818       6 Minute Walk   Phase Initial     Distance 900 feet     Walk Time 6 minutes     # of Rest Breaks 0     MPH 1.7     METS 2.3     RPE 12     Perceived Dyspnea  13     VO2 Peak 5.59     Symptoms No     Resting HR 56 bpm     Resting BP 112/80 mmHg     Max Ex. HR 70 bpm     Max Ex. BP 148/90 mmHg     2 Minute Post BP 138/86 mmHg        Initial Exercise Prescription:     Initial Exercise Prescription - 02/23/16 1800    Date of Initial Exercise RX and Referring Provider   Date 02/23/16   Referring Provider Dr. Percival Spanish   NuStep   Level 2   Watts 25   Minutes 15   METs 1.9   Arm Ergometer   Level 2.5   Watts 16   Minutes 15   METs 2.5   Prescription Details   Frequency (times per week) 3   Duration Progress to 30 minutes of continuous aerobic without signs/symptoms of physical distress   Intensity   THRR REST +  30   THRR 40-80% of Max Heartrate 85-100-114   Ratings of Perceived Exertion 11-13   Perceived Dyspnea 0-4   Progression   Progression Continue to progress workloads to maintain intensity without signs/symptoms of physical distress.   Resistance Training   Training Prescription Yes   Weight 1   Reps 10-12      Perform Capillary Blood Glucose checks as needed.  Exercise Prescription Changes:     Exercise Prescription Changes      03/16/16 1200 03/27/16 1400 04/03/16 1300 04/10/16 1500     Exercise Review   Progression Yes Yes Yes Yes    Response to Exercise   Blood Pressure (Admit) 138/70 mmHg 122/82 mmHg 122/82 mmHg 150/88 mmHg    Blood Pressure (Exercise) 162/90 mmHg 146/82 mmHg 146/82 mmHg 158/90 mmHg    Blood Pressure (Exit) 118/62 mmHg 120/80 mmHg 120/80 mmHg 142/64 mmHg    Heart Rate (Admit) 70 bpm 56 bpm 56 bpm 51 bpm    Heart Rate (Exercise) 107 bpm 93 bpm 93 bpm 107 bpm    Heart Rate (Exit) 79 bpm 65  bpm 65 bpm 90 bpm  Rating of Perceived Exertion (Exercise) 13 13 13 13     Symptoms No No No No    Duration Progress to 30 minutes of continuous aerobic without signs/symptoms of physical distress Progress to 30 minutes of continuous aerobic without signs/symptoms of physical distress Progress to 30 minutes of continuous aerobic without signs/symptoms of physical distress Progress to 30 minutes of continuous aerobic without signs/symptoms of physical distress    Intensity Rest + 30 Rest + 30 Rest + 30 Rest + 30    Progression   Progression Continue to progress workloads to maintain intensity without signs/symptoms of physical distress. Continue to progress workloads to maintain intensity without signs/symptoms of physical distress. Continue to progress workloads to maintain intensity without signs/symptoms of physical distress. Continue to progress workloads to maintain intensity without signs/symptoms of physical distress.    Resistance Training   Training Prescription Yes Yes Yes Yes    Weight 1 1 1 1     Reps 10-12 10-12 10-12 10-12    NuStep   Level 2 2 2 2     Watts 20 20 20 20     Minutes 15 15 15 15     METs 2 2 2 2     Arm Ergometer   Level 2.6 2.7 2.8 2.8    Watts 23 23 23 23     Minutes 15 15 15 15     METs 2.7 2.7 2.7 2.7    Home Exercise Plan   Plans to continue exercise at [facility]    Frequency Add 2 additional days to program exercise sessions. Add 2 additional days to program exercise sessions. Add 2 additional days to program exercise sessions. Add 2 additional days to program exercise sessions.       Exercise Comments:    Discharge Exercise Prescription (Final Exercise Prescription Changes):     Exercise Prescription Changes - 04/10/16 1500    Exercise Review   Progression Yes   Response to Exercise   Blood Pressure (Admit) 150/88 mmHg   Blood Pressure (Exercise) 158/90 mmHg   Blood Pressure (Exit) 142/64 mmHg   Heart Rate (Admit) 51 bpm   Heart Rate  (Exercise) 107 bpm   Heart Rate (Exit) 90 bpm   Rating of Perceived Exertion (Exercise) 13   Symptoms No   Duration Progress to 30 minutes of continuous aerobic without signs/symptoms of physical distress   Intensity Rest + 30   Progression   Progression Continue to progress workloads to maintain intensity without signs/symptoms of physical distress.   Resistance Training   Training Prescription Yes   Weight 1   Reps 10-12   NuStep   Level 2   Watts 20   Minutes 15   METs 2   Arm Ergometer   Level 2.8   Watts 23   Minutes 15   METs 2.7   Home Exercise Plan   Plans to continue exercise at Home   Frequency Add 2 additional days to program exercise sessions.      Nutrition:  Target Goals: Understanding of nutrition guidelines, daily intake of sodium 1500mg , cholesterol 200mg , calories 30% from fat and 7% or less from saturated fats, daily to have 5 or more servings of fruits and vegetables.  Biometrics:     Pre Biometrics - 02/23/16 1829    Pre Biometrics   Height 5\' 6"  (1.676 m)   Weight 163 lb 11.2 oz (74.254 kg)   Waist Circumference 36 inches   Hip Circumference 38 inches   Waist to Hip  Ratio 0.95 %   BMI (Calculated) 26.5   Triceps Skinfold 5 mm   % Body Fat 21.8 %   Grip Strength 60.6 kg   Flexibility 0 in   Single Leg Stand 5 seconds       Nutrition Therapy Plan and Nutrition Goals:   Nutrition Discharge: Rate Your Plate Scores:     Nutrition Assessments - 02/23/16 1516    MEDFICTS Scores   Pre Score 67      Nutrition Goals Re-Evaluation:   Psychosocial: Target Goals: Acknowledge presence or absence of depression, maximize coping skills, provide positive support system. Participant is able to verbalize types and ability to use techniques and skills needed for reducing stress and depression.  Initial Review & Psychosocial Screening:     Initial Psych Review & Screening - 02/23/16 [place]? Yes    Barriers   Psychosocial barriers to participate in program There are no identifiable barriers or psychosocial needs.   Screening Interventions   Interventions Encouraged to exercise      Quality of Life Scores:     Quality of Life - 02/23/16 1831    Quality of Life Scores   Health/Function Pre 23.63 %   Socioeconomic Pre 29.17 %   Psych/Spiritual Pre 28.93 %   Family Pre 30 %   GLOBAL Pre 27.03 %      PHQ-9:     Recent Review Flowsheet Data    Depression screen [facility] 2/9 02/23/2016 02/13/2016 11/07/2015 11/01/2015 10/26/2015   Decreased Interest 0 0 0 0 0   Down, Depressed, Hopeless 0 0 0 0 0   PHQ - 2 Score 0 0 0 0 0   Altered sleeping 0 - - - -   Tired, decreased energy 0 - - - -   Change in appetite 1 - - - -   Feeling bad or failure about yourself  0 - - - -   Trouble concentrating 1 - - - -   Moving slowly or fidgety/restless 0 - - - -   Suicidal thoughts 0 - - - -   PHQ-9 Score 2 - - - -   Difficult doing work/chores Not difficult at all - - - -      Psychosocial Evaluation and Intervention:     Psychosocial Evaluation - 02/23/16 1517    Psychosocial Evaluation & Interventions   Interventions Relaxation education;Stress management education;Encouraged to exercise with the program and follow exercise prescription   Continued Psychosocial Services Needed No      Psychosocial Re-Evaluation:     Psychosocial Re-Evaluation      03/11/16 1423 04/15/16 1158         Psychosocial Re-Evaluation   Interventions Encouraged to attend Cardiac Rehabilitation for the exercise Relaxation education;Encouraged to attend Cardiac Rehabilitation for the exercise;Stress management education      Continued Psychosocial Services Needed No No         Vocational Rehabilitation: Provide vocational rehab assistance to qualifying candidates.   Vocational Rehab Evaluation & Intervention:     Vocational Rehab - 02/23/16 1514    Initial Vocational Rehab Evaluation & Intervention    Assessment shows need for Vocational Rehabilitation No      Education: Education Goals: Education classes will be provided on a weekly basis, covering required topics. Participant will state understanding/return demonstration of topics presented.  Learning Barriers/Preferences:     Learning Barriers/Preferences - 02/23/16 1513    Learning Barriers/Preferences  Learning Barriers None   Learning Preferences Written Material      Education Topics: Hypertension, Hypertension Reduction -Define heart disease and high blood pressure. Discus how high blood pressure affects the body and ways to reduce high blood pressure.   Exercise and Your Heart -Discuss why it is important to exercise, the FITT principles of exercise, normal and abnormal responses to exercise, and how to exercise safely.   Angina -Discuss definition of angina, causes of angina, treatment of angina, and how to decrease risk of having angina.          CARDIAC REHAB PHASE II EXERCISE from 04/03/2016 in [place]   Date  03/06/16   Educator  -- [D Coad]   Instruction Review Code  2- meets goals/outcomes      Cardiac Medications -Review what the following cardiac medications are used for, how they affect the body, and side effects that may occur when taking the medications.  Medications include Aspirin, Beta blockers, calcium channel blockers, ACE Inhibitors, angiotensin receptor blockers, diuretics, digoxin, and antihyperlipidemics.   Congestive Heart Failure -Discuss the definition of CHF, how to live with CHF, the signs and symptoms of CHF, and how keep track of weight and sodium intake.      CARDIAC REHAB PHASE II EXERCISE from 04/03/2016 in [place]   Date  03/20/16   Educator  DC   Instruction Review Code  2- meets goals/outcomes      Heart Disease and Intimacy -Discus the effect sexual activity has on the heart, how changes occur during intimacy as we age,  and safety during sexual activity.      CARDIAC REHAB PHASE II EXERCISE from 04/03/2016 in [place]   Date  03/27/16   Educator  DC   Instruction Review Code  2- meets goals/outcomes      Smoking Cessation / COPD -Discuss different methods to quit smoking, the health benefits of quitting smoking, and the definition of COPD.      CARDIAC REHAB PHASE II EXERCISE from 04/03/2016 in [place]   Date  04/05/16   Educator  Russella Dar   Instruction Review Code  2- meets goals/outcomes      Nutrition I: Fats -Discuss the types of cholesterol, what cholesterol does to the heart, and how cholesterol levels can be controlled.   Nutrition II: Labels -Discuss the different components of food labels and how to read food label   Heart Parts and Heart Disease -Discuss the anatomy of the heart, the pathway of blood circulation through the heart, and these are affected by heart disease.   Stress I: Signs and Symptoms -Discuss the causes of stress, how stress may lead to anxiety and depression, and ways to limit stress.   Stress II: Relaxation -Discuss different types of relaxation techniques to limit stress.   Warning Signs of Stroke / TIA -Discuss definition of a stroke, what the signs and symptoms are of a stroke, and how to identify when someone is having stroke.   Knowledge Questionnaire Score:     Knowledge Questionnaire Score - 02/23/16 1514    Knowledge Questionnaire Score   Pre Score 20/24      Core Components/Risk Factors/Patient Goals at Admission:     Personal Goals and Risk Factors at Admission - 02/23/16 1516    Core Components/Risk Factors/Patient Goals on Admission   Improve shortness of breath with ADL's Yes   Intervention Provide education, individualized exercise plan and daily  activity instruction to help decrease symptoms of SOB with activities of daily living.   Expected Outcomes Short Term: Achieves a  reduction of symptoms when performing activities of daily living.      Core Components/Risk Factors/Patient Goals Review:      Goals and Risk Factor Review      02/23/16 1516 03/11/16 1422 04/15/16 1158       Core Components/Risk Factors/Patient Goals Review   Personal Goals Review Improve shortness of breath with ADL's Improve shortness of breath with ADL's Improve shortness of breath with ADL's     Review  Patient is progressing well in the program.  No obvious SOB noted with exercise.  Patient is progressing well in the program.  No obvious SOB noted with exercise.      Expected Outcomes  decrease SOB with ADL's decrease SOB with ADL's        Core Components/Risk Factors/Patient Goals at Discharge (Final Review):      Goals and Risk Factor Review - 04/15/16 1158    Core Components/Risk Factors/Patient Goals Review   Personal Goals Review Improve shortness of breath with ADL's   Review Patient is progressing well in the program.  No obvious SOB noted with exercise.    Expected Outcomes decrease SOB with ADL's      ITP Comments:     ITP Comments      02/23/16 1514 03/22/16 0900         ITP Comments Pt is a 82 year old male living with spouse and enjoys playing golf.  Patient does not exhibit any s/s of depression at this time.  Patient meet with the Dietician in our group heart healthy guidelines class at 9am. Group was shown how to create a heart healthy plate, how to make healthy choices while dining out, how to choose healthy fats , proteins, carbohydrates and the correct amount of sodium to use.          Comments:

## 2016-04-17 ENCOUNTER — Encounter (HOSPITAL_COMMUNITY)
Admission: RE | Admit: 2016-04-17 | Discharge: 2016-04-17 | Disposition: A | Discharge status: Home / Self Care | Payer: Medicare Other | Source: Ambulatory Visit | Attending: Cardiology | Admitting: Cardiology

## 2016-04-17 DIAGNOSIS — Z951 Presence of aortocoronary bypass graft: Secondary | ICD-10-CM | POA: Diagnosis not present

## 2016-04-17 NOTE — Progress Notes (Signed)
Daily Session Note  Patient Details  Name: Frederick Davis MRN: [id number] Date of Birth: 02/18/1923 Referring Provider:        CARDIAC REHAB PHASE II ORIENTATION from 02/23/2016 in [place] CARDIAC REHABILITATION   Referring Provider  Dr. Hochrein      Encounter Date: 04/17/2016  Check In:     Session Check In - 04/17/16 0815    Check-In   Location [facility]   Staff Present Diane Coad, MS, EP, [facility], Exercise Physiologist;Elona Yinger, BS, EP, Exercise Physiologist   Supervising physician immediately available to respond to emergencies See telemetry face sheet for immediately available MD   Medication changes reported     No   Fall or balance concerns reported    No   Warm-up and Cool-down Performed as group-led instruction   Resistance Training Performed Yes   VAD Patient? No   Pain Assessment   Currently in Pain? No/denies   Multiple Pain Sites No      Capillary Blood Glucose: No results found for this or any previous visit (from the past 24 hour(s)).   Goals Met:  Independence with exercise equipment Exercise tolerated well No report of cardiac concerns or symptoms Strength training completed today  Goals Unmet:  Not Applicable  Comments: Check out 915   Dr. Suresh Koneswaran is Medical Director for [place] [facility] and Pulmonary Re[facility]. 

## 2016-04-19 ENCOUNTER — Other Ambulatory Visit: Payer: Self-pay | Admitting: Physician Assistant

## 2016-04-19 ENCOUNTER — Other Ambulatory Visit: Payer: Self-pay | Admitting: *Deleted

## 2016-04-19 ENCOUNTER — Encounter (HOSPITAL_COMMUNITY)
Admission: RE | Admit: 2016-04-19 | Discharge: 2016-04-19 | Disposition: A | Discharge status: Home / Self Care | Payer: Medicare Other | Source: Ambulatory Visit | Attending: Cardiology | Admitting: Cardiology

## 2016-04-19 DIAGNOSIS — Z951 Presence of aortocoronary bypass graft: Secondary | ICD-10-CM | POA: Diagnosis not present

## 2016-04-19 MED ORDER — CARVEDILOL 3.125 MG PO TABS: 3.1250 mg | ORAL_TABLET | Freq: Two times a day (BID) | ORAL | Status: DC

## 2016-04-19 NOTE — Progress Notes (Signed)
Daily Session Note  Patient Details  Name: Frederick Davis MRN: [id number] Date of Birth: January 05, 1923 Referring Provider:        CARDIAC REHAB PHASE II ORIENTATION from 02/23/2016 in [place]   Referring Provider  Dr. Percival Spanish      Encounter Date: 04/19/2016  Check In:     Session Check In - 04/19/16 0815    Check-In   Location [facility]   Staff Present Russella Dar, MS, EP, [facility], Exercise Physiologist;Nilam Quakenbush Luther Parody, BS, EP, Exercise Physiologist   Supervising physician immediately available to respond to emergencies See telemetry face sheet for immediately available MD   Medication changes reported     No   Fall or balance concerns reported    No   Warm-up and Cool-down Performed as group-led instruction   Resistance Training Performed Yes   VAD Patient? No   Pain Assessment   Currently in Pain? No/denies   Multiple Pain Sites No      Capillary Blood Glucose: No results found for this or any previous visit (from the past 24 hour(s)).   Goals Met:  Independence with exercise equipment Exercise tolerated well No report of cardiac concerns or symptoms Strength training completed today  Goals Unmet:  Not Applicable  Comments: Check out 915   Dr. Kate Sable is Medical Director for [place] and Pulmonary Rehab.

## 2016-04-22 ENCOUNTER — Encounter (HOSPITAL_COMMUNITY)
Admission: RE | Admit: 2016-04-22 | Discharge: 2016-04-22 | Disposition: A | Discharge status: Home / Self Care | Payer: Medicare Other | Source: Ambulatory Visit | Attending: Cardiology | Admitting: Cardiology

## 2016-04-22 DIAGNOSIS — Z951 Presence of aortocoronary bypass graft: Secondary | ICD-10-CM | POA: Insufficient documentation

## 2016-04-22 NOTE — Progress Notes (Signed)
Daily Session Note  Patient Details  Name: Frederick Davis MRN: [id number] Date of Birth: 10/25/1922 Referring Provider:        CARDIAC REHAB PHASE II ORIENTATION from 02/23/2016 in [place]   Referring Provider  Dr. Percival Spanish      Encounter Date: 04/22/2016  Check In:     Session Check In - 04/22/16 0815    Check-In   Location [facility]   Staff Present Diane Angelina Pih, MS, EP, [facility], Exercise Physiologist;Gregory Luther Parody, BS, EP, Exercise Physiologist; Wynetta Emery, RN, BSN   Supervising physician immediately available to respond to emergencies See telemetry face sheet for immediately available MD   Medication changes reported     No   Fall or balance concerns reported    No   Warm-up and Cool-down Performed as group-led instruction   Resistance Training Performed Yes   VAD Patient? No   Pain Assessment   Currently in Pain? No/denies   Multiple Pain Sites No      Capillary Blood Glucose: No results found for this or any previous visit (from the past 24 hour(s)).   Goals Met:  Independence with exercise equipment Exercise tolerated well No report of cardiac concerns or symptoms Strength training completed today  Goals Unmet:  Not Applicable  Comments: Check out 9:15.   Dr. Kate Sable is Medical Director for [facility] Cardiac and Pulmonary Rehab.

## 2016-04-24 ENCOUNTER — Encounter (HOSPITAL_COMMUNITY)
Admission: RE | Admit: 2016-04-24 | Discharge: 2016-04-24 | Disposition: A | Discharge status: Home / Self Care | Payer: Medicare Other | Source: Ambulatory Visit | Attending: Cardiology | Admitting: Cardiology

## 2016-04-24 DIAGNOSIS — Z951 Presence of aortocoronary bypass graft: Secondary | ICD-10-CM | POA: Diagnosis not present

## 2016-04-24 NOTE — Progress Notes (Signed)
Daily Session Note  Patient Details  Name: ABEDNEGO YEATES MRN: [id number] Date of Birth: Feb 28, 1923 Referring Provider:        CARDIAC REHAB PHASE II ORIENTATION from 02/23/2016 in [place]   Referring Provider  Dr. Percival Spanish      Encounter Date: 04/24/2016  Check In:     Session Check In - 04/24/16 0815    Check-In   Location [facility]   Staff Present Diane Angelina Pih, MS, EP, [facility], Exercise Physiologist;Gregory Luther Parody, BS, EP, Exercise Physiologist; Wynetta Emery, RN, BSN   Supervising physician immediately available to respond to emergencies See telemetry face sheet for immediately available MD   Medication changes reported     No   Fall or balance concerns reported    No   Warm-up and Cool-down Performed as group-led instruction   Resistance Training Performed Yes   VAD Patient? No   Pain Assessment   Currently in Pain? Yes   Pain Score 4    Pain Location Leg  Left Knee   Pain Orientation Left   Pain Descriptors / Indicators Aching   Pain Type Chronic pain   Pain Onset Today   Pain Frequency Constant   Aggravating Factors  --  Increased activity.    Multiple Pain Sites No      Capillary Blood Glucose: No results found for this or any previous visit (from the past 24 hour(s)).   Goals Met:  Independence with exercise equipment Exercise tolerated well No report of cardiac concerns or symptoms Strength training completed today  Goals Unmet:  Not Applicable  Comments: Check out 9:15   Dr. Kate Sable is Medical Director for [place] and Pulmonary Rehab.

## 2016-04-26 ENCOUNTER — Encounter (HOSPITAL_COMMUNITY)
Admission: RE | Admit: 2016-04-26 | Discharge: 2016-04-26 | Disposition: A | Discharge status: Home / Self Care | Payer: Medicare Other | Source: Ambulatory Visit | Attending: Cardiology | Admitting: Cardiology

## 2016-04-26 DIAGNOSIS — Z951 Presence of aortocoronary bypass graft: Secondary | ICD-10-CM | POA: Diagnosis not present

## 2016-04-26 NOTE — Progress Notes (Signed)
Daily Session Note  Patient Details  Name: Frederick Davis MRN: [id number] Date of Birth: 03/04/1923 Referring Provider:        CARDIAC REHAB PHASE II ORIENTATION from 02/23/2016 in [place]   Referring Provider  Dr. Percival Spanish      Encounter Date: 04/26/2016  Check In:     Session Check In - 04/26/16 0815    Check-In   Location [facility]   Staff Present Diane Angelina Pih, MS, EP, [facility], Exercise Physiologist;Shandria Clinch Luther Parody, BS, EP, Exercise Physiologist;Debra Wynetta Emery, RN, BSN   Supervising physician immediately available to respond to emergencies See telemetry face sheet for immediately available MD   Medication changes reported     No   Fall or balance concerns reported    No   Warm-up and Cool-down Performed as group-led instruction   Resistance Training Performed Yes   VAD Patient? No   Pain Assessment   Currently in Pain? No/denies   Pain Score 0-No pain   Multiple Pain Sites No      Capillary Blood Glucose: No results found for this or any previous visit (from the past 24 hour(s)).   Goals Met:  Independence with exercise equipment Exercise tolerated well No report of cardiac concerns or symptoms Strength training completed today  Goals Unmet:  Not Applicable  Comments: Check out 915   Dr. Kate Sable is Medical Director for [place] and Pulmonary Rehab.

## 2016-04-29 ENCOUNTER — Encounter (HOSPITAL_COMMUNITY)
Admission: RE | Admit: 2016-04-29 | Discharge: 2016-04-29 | Disposition: A | Discharge status: Home / Self Care | Payer: Medicare Other | Source: Ambulatory Visit | Attending: Cardiology | Admitting: Cardiology

## 2016-04-29 DIAGNOSIS — Z951 Presence of aortocoronary bypass graft: Secondary | ICD-10-CM

## 2016-04-29 NOTE — Progress Notes (Signed)
Daily Session Note  Patient Details  Name: Frederick Davis MRN: [id number] Date of Birth: 1923-03-26 Referring Provider:        CARDIAC REHAB PHASE II ORIENTATION from 02/23/2016 in [place]   Referring Provider  Dr. Percival Spanish      Encounter Date: 04/29/2016  Check In:     Session Check In - 04/29/16 0815    Check-In   Location [facility]   Staff Present Diane Angelina Pih, MS, EP, [facility], Exercise Physiologist;Frederick Davis, BS, EP, Exercise Physiologist;Debra Wynetta Emery, RN, BSN   Supervising physician immediately available to respond to emergencies See telemetry face sheet for immediately available MD   Medication changes reported     No   Fall or balance concerns reported    No   Warm-up and Cool-down Performed as group-led instruction   Resistance Training Performed Yes   VAD Patient? No   Pain Assessment   Currently in Pain? No/denies   Pain Score 0-No pain   Multiple Pain Sites No      Capillary Blood Glucose: No results found for this or any previous visit (from the past 24 hour(s)).   Goals Met:  Independence with exercise equipment Exercise tolerated well No report of cardiac concerns or symptoms Strength training completed today  Goals Unmet:  Not Applicable  Comments: Check out 915   Dr. Kate Sable is Medical Director for [place] and Pulmonary Rehab.

## 2016-05-01 ENCOUNTER — Encounter (HOSPITAL_COMMUNITY)
Admission: RE | Admit: 2016-05-01 | Discharge: 2016-05-01 | Disposition: A | Discharge status: Home / Self Care | Payer: Medicare Other | Source: Ambulatory Visit | Attending: Cardiology | Admitting: Cardiology

## 2016-05-01 DIAGNOSIS — Z951 Presence of aortocoronary bypass graft: Secondary | ICD-10-CM

## 2016-05-01 NOTE — Progress Notes (Signed)
Daily Session Note  Patient Details  Name: LUCIO LITSEY MRN: [id number] Date of Birth: 07-06-1923 Referring Provider:        CARDIAC REHAB PHASE II ORIENTATION from 02/23/2016 in [place]   Referring Provider  Dr. Percival Spanish      Encounter Date: 05/01/2016  Check In:     Session Check In - 05/01/16 0815    Check-In   Location [facility]   Staff Present Suzanne Boron, BS, EP, Exercise Physiologist;Cerissa Zeiger Wynetta Emery, RN, BSN   Supervising physician immediately available to respond to emergencies See telemetry face sheet for immediately available MD   Medication changes reported     No   Fall or balance concerns reported    No   Warm-up and Cool-down Performed as group-led instruction   Resistance Training Performed Yes   VAD Patient? No   Pain Assessment   Currently in Pain? No/denies   Pain Score 0-No pain   Multiple Pain Sites No      Capillary Blood Glucose: No results found for this or any previous visit (from the past 24 hour(s)).   Goals Met:  Independence with exercise equipment Exercise tolerated well No report of cardiac concerns or symptoms Strength training completed today  Goals Unmet:  Not Applicable  Comments: Check out 915.   Dr. Kate Sable is Medical Director for [facility] Cardiac and Pulmonary Rehab.

## 2016-05-03 ENCOUNTER — Encounter (HOSPITAL_COMMUNITY)
Admission: RE | Admit: 2016-05-03 | Discharge: 2016-05-03 | Disposition: A | Discharge status: Home / Self Care | Payer: Medicare Other | Source: Ambulatory Visit | Attending: Cardiology | Admitting: Cardiology

## 2016-05-03 DIAGNOSIS — Z951 Presence of aortocoronary bypass graft: Secondary | ICD-10-CM

## 2016-05-03 NOTE — Progress Notes (Signed)
Daily Session Note  Patient Details  Name: Frederick Davis MRN: [id number] Date of Birth: 09-Nov-1922 Referring Provider:        CARDIAC REHAB PHASE II ORIENTATION from 02/23/2016 in [place]   Referring Provider  Dr. Percival Spanish      Encounter Date: 05/03/2016  Check In:     Session Check In - 05/03/16 0845    Check-In   Location [facility]   Staff Present Diane Angelina Pih, MS, EP, [facility], Exercise Physiologist;Alverda Nazzaro Luther Parody, BS, EP, Exercise Physiologist;Debra Wynetta Emery, RN, BSN   Supervising physician immediately available to respond to emergencies See telemetry face sheet for immediately available MD   Medication changes reported     No   Fall or balance concerns reported    No   Warm-up and Cool-down Performed as group-led instruction   Resistance Training Performed Yes   VAD Patient? No   Pain Assessment   Currently in Pain? No/denies   Pain Score 0-No pain   Multiple Pain Sites No      Capillary Blood Glucose: No results found for this or any previous visit (from the past 24 hour(s)).   Goals Met:  Independence with exercise equipment Exercise tolerated well No report of cardiac concerns or symptoms Strength training completed today  Goals Unmet:  Not Applicable  Comments: Check out 915   Dr. Kate Sable is Medical Director for [place] and Pulmonary Rehab.

## 2016-05-06 ENCOUNTER — Encounter (HOSPITAL_COMMUNITY)
Admission: RE | Admit: 2016-05-06 | Discharge: 2016-05-06 | Disposition: A | Discharge status: Home / Self Care | Payer: Medicare Other | Source: Ambulatory Visit | Attending: Cardiology | Admitting: Cardiology

## 2016-05-06 DIAGNOSIS — Z951 Presence of aortocoronary bypass graft: Secondary | ICD-10-CM

## 2016-05-06 NOTE — Progress Notes (Signed)
Daily Session Note  Patient Details  Name: Frederick Davis MRN: [id number] Date of Birth: [birth year] Referring Provider:        CARDIAC REHAB PHASE II ORIENTATION from 02/23/2016 in [place]   Referring Provider  Dr. Percival Spanish      Encounter Date: 05/06/2016  Check In:     Session Check In - 05/06/16 0815    Check-In   Location [facility]   Staff Present Edelmira Gallogly Angelina Pih, MS, EP, [facility], Exercise Physiologist;Gregory Luther Parody, BS, EP, Exercise Physiologist;Debra Wynetta Emery, RN, BSN   Supervising physician immediately available to respond to emergencies See telemetry face sheet for immediately available MD   Medication changes reported     No   Fall or balance concerns reported    No   Warm-up and Cool-down Performed as group-led instruction   Resistance Training Performed Yes   VAD Patient? No   Pain Assessment   Pain Score 0-No pain   Multiple Pain Sites No      Capillary Blood Glucose: No results found for this or any previous visit (from the past 24 hour(s)).   Goals Met:  Independence with exercise equipment Exercise tolerated well No report of cardiac concerns or symptoms Strength training completed today  Goals Unmet:  Not Applicable  Comments: Check out 915   Dr. Kate Sable is Medical Director for [place] and Pulmonary Rehab.

## 2016-05-08 ENCOUNTER — Encounter (HOSPITAL_COMMUNITY)
Admission: RE | Admit: 2016-05-08 | Discharge: 2016-05-08 | Disposition: A | Discharge status: Home / Self Care | Payer: Medicare Other | Source: Ambulatory Visit | Attending: Cardiology | Admitting: Cardiology

## 2016-05-08 DIAGNOSIS — Z951 Presence of aortocoronary bypass graft: Secondary | ICD-10-CM

## 2016-05-08 NOTE — Progress Notes (Signed)
Daily Session Note  Patient Details  Name: Frederick Davis MRN: [id number] Date of Birth: 1922-12-17 Referring Provider:        CARDIAC REHAB PHASE II ORIENTATION from 02/23/2016 in [place]   Referring Provider  Dr. Percival Spanish      Encounter Date: 05/08/2016  Check In:     Session Check In - 05/08/16 0815    Check-In   Location [facility]   Staff Present Diane Angelina Pih, MS, EP, [facility], Exercise Physiologist;Kriste Broman Luther Parody, BS, EP, Exercise Physiologist;Debra Wynetta Emery, RN, BSN   Supervising physician immediately available to respond to emergencies See telemetry face sheet for immediately available MD   Medication changes reported     No   Fall or balance concerns reported    No   Warm-up and Cool-down Performed as group-led instruction   Resistance Training Performed Yes   VAD Patient? No   Pain Assessment   Currently in Pain? No/denies   Pain Score 0-No pain   Multiple Pain Sites No      Capillary Blood Glucose: No results found for this or any previous visit (from the past 24 hour(s)).   Goals Met:  Independence with exercise equipment Exercise tolerated well No report of cardiac concerns or symptoms Strength training completed today  Goals Unmet:  Not Applicable  Comments: Check out 915   Dr. Kate Sable is Medical Director for [place] and Pulmonary Rehab.

## 2016-05-10 ENCOUNTER — Encounter (HOSPITAL_COMMUNITY)
Admission: RE | Admit: 2016-05-10 | Discharge: 2016-05-10 | Disposition: A | Discharge status: Home / Self Care | Payer: Medicare Other | Source: Ambulatory Visit | Attending: Cardiology | Admitting: Cardiology

## 2016-05-10 DIAGNOSIS — Z951 Presence of aortocoronary bypass graft: Secondary | ICD-10-CM | POA: Diagnosis not present

## 2016-05-10 NOTE — Progress Notes (Signed)
Daily Session Note  Patient Details  Name: JORDY VERBA MRN: [id number] Date of Birth: 08-Jun-1923 Referring Provider:        CARDIAC REHAB PHASE II ORIENTATION from 02/23/2016 in [place]   Referring Provider  Dr. Percival Spanish      Encounter Date: 05/10/2016  Check In:     Session Check In - 05/10/16 0815    Check-In   Location [facility]   Staff Present Diane Angelina Pih, MS, EP, [facility], Exercise Physiologist;Gregory Luther Parody, BS, EP, Exercise Physiologist;Theoren Palka Wynetta Emery, RN, BSN   Supervising physician immediately available to respond to emergencies See telemetry face sheet for immediately available MD   Medication changes reported     No   Fall or balance concerns reported    No   Warm-up and Cool-down Performed as group-led instruction   Resistance Training Performed Yes   VAD Patient? No   Pain Assessment   Currently in Pain? No/denies   Pain Score 0-No pain   Multiple Pain Sites No      Capillary Blood Glucose: No results found for this or any previous visit (from the past 24 hour(s)).   Goals Met:  Independence with exercise equipment Exercise tolerated well No report of cardiac concerns or symptoms Strength training completed today  Goals Unmet:  Not Applicable  Comments: Check out 915.   Dr. Kate Sable is Medical Director for [name] Cardiac and Pulmonary Rehab.

## 2016-05-13 ENCOUNTER — Encounter (HOSPITAL_COMMUNITY)
Admission: RE | Admit: 2016-05-13 | Discharge: 2016-05-13 | Disposition: A | Discharge status: Home / Self Care | Payer: Medicare Other | Source: Ambulatory Visit | Attending: Cardiology | Admitting: Cardiology

## 2016-05-13 DIAGNOSIS — Z951 Presence of aortocoronary bypass graft: Secondary | ICD-10-CM | POA: Diagnosis not present

## 2016-05-13 NOTE — Progress Notes (Signed)
Daily Session Note  Patient Details  Name: Frederick Davis MRN: [id number] Date of Birth: [birth year] Referring Provider:   Flowsheet Row CARDIAC REHAB PHASE II ORIENTATION from 02/23/2016 in [place]  Referring Provider  Dr. Percival Spanish      Encounter Date: 05/13/2016  Check In:     Session Check In - 05/13/16 0815      Check-In   Location [facility]   Staff Present Diane Angelina Pih, MS, EP, [facility], Exercise Physiologist;Gregory Luther Parody, BS, EP, Exercise Physiologist;Isami Mehra Wynetta Emery, RN, BSN   Supervising physician immediately available to respond to emergencies See telemetry face sheet for immediately available MD   Medication changes reported     No   Fall or balance concerns reported    No   Warm-up and Cool-down Performed as group-led instruction   Resistance Training Performed Yes   VAD Patient? No     Pain Assessment   Currently in Pain? No/denies   Pain Score 0-No pain   Multiple Pain Sites No      Capillary Blood Glucose: No results found for this or any previous visit (from the past 24 hour(s)).   Goals Met:  Independence with exercise equipment Exercise tolerated well No report of cardiac concerns or symptoms Strength training completed today  Goals Unmet:  Not Applicable  Comments: Check out 915.   Dr. Kate Sable is Medical Director for [facility] Cardiac and Pulmonary Rehab.

## 2016-05-15 ENCOUNTER — Encounter (HOSPITAL_COMMUNITY)
Admission: RE | Admit: 2016-05-15 | Discharge: 2016-05-15 | Disposition: A | Discharge status: Home / Self Care | Payer: Medicare Other | Source: Ambulatory Visit | Attending: Cardiology | Admitting: Cardiology

## 2016-05-15 DIAGNOSIS — Z951 Presence of aortocoronary bypass graft: Secondary | ICD-10-CM | POA: Diagnosis not present

## 2016-05-15 NOTE — Progress Notes (Signed)
Daily Session Note  Patient Details  Name: Frederick Davis MRN: [id number] Date of Birth: Dec 02, 1922 Referring Provider:   Flowsheet Row CARDIAC REHAB PHASE II ORIENTATION from 02/23/2016 in [place]  Referring Provider  Dr. Percival Spanish      Encounter Date: 05/15/2016  Check In:     Session Check In - 05/15/16 0815      Check-In   Location [facility]   Staff Present Diane Angelina Pih, MS, EP, [facility], Exercise Physiologist;Yina Riviere Luther Parody, BS, EP, Exercise Physiologist;Debra Wynetta Emery, RN, BSN   Supervising physician immediately available to respond to emergencies See telemetry face sheet for immediately available MD   Medication changes reported     No   Fall or balance concerns reported    No   Warm-up and Cool-down Performed as group-led instruction   Resistance Training Performed Yes   VAD Patient? No     Pain Assessment   Currently in Pain? No/denies   Pain Score 0-No pain   Multiple Pain Sites No      Capillary Blood Glucose: No results found for this or any previous visit (from the past 24 hour(s)).   Goals Met:  Independence with exercise equipment Exercise tolerated well No report of cardiac concerns or symptoms Strength training completed today  Goals Unmet:  Not Applicable  Comments: Check out 915   Dr. Kate Sable is Medical Director for [place] and Pulmonary Rehab.

## 2016-05-15 NOTE — Progress Notes (Signed)
Cardiac Individual Treatment Plan  Patient Details  Name: Frederick Davis MRN: [id number] Date of Birth: 08/20/1923 Referring Provider:   Flowsheet Row CARDIAC REHAB PHASE II ORIENTATION from 02/23/2016 in [place]  Referring Provider  Dr. Percival Spanish      Initial Encounter Date:  Flowsheet Row CARDIAC REHAB PHASE II ORIENTATION from 02/23/2016 in [place]  Date  02/23/16  Referring Provider  Dr. Percival Spanish      Visit Diagnosis: S/P CABG x 3  Patient's Home Medications on Admission:  Current Outpatient Prescriptions:  .  acetaminophen (TYLENOL) 500 MG tablet, Take 500 mg by mouth at bedtime., Disp: , Rfl:  .  aspirin 81 MG tablet, Take 1 tablet (81 mg total) by mouth daily., Disp: 30 tablet, Rfl:  .  carvedilol (COREG) 3.125 MG tablet, Take 1 tablet (3.125 mg total) by mouth 2 (two) times daily with a meal., Disp: 60 tablet, Rfl: 1 .  furosemide (LASIX) 20 MG tablet, Take 1 tablet daily as needed., Disp: 30 tablet, Rfl: 6 .  levothyroxine (SYNTHROID, LEVOTHROID) 75 MCG tablet, Take 75 mcg by mouth daily before breakfast., Disp: , Rfl:  .  nitroGLYCERIN (NITROSTAT) 0.4 MG SL tablet, PLACE 1 TABLET UNDER THE TONGUE AT ONSET OF CHEST PAIN EVERY 5 MINTUES UP TO 3 TIMES AS NEEDED, Disp: 25 tablet, Rfl: 0 No current facility-administered medications for this encounter.   Facility-Administered Medications Ordered in Other Encounters:  .  etomidate (AMIDATE) injection, , , PRN, Terrill Mohr, CRNA, 10 mg at 03/12/13 1541 .  lidocaine (cardiac) 100 mg/23ml (XYLOCAINE) 20 MG/ML injection 2%, , , PRN, Terrill Mohr, CRNA, 50 mg at 12/08/15 0853 .  succinylcholine (ANECTINE) injection, , , PRN, Terrill Mohr, CRNA, 100 mg at 03/12/13 1541  Past Medical History: Past Medical History:  Diagnosis Date  . Anemia   . CAD (coronary artery disease)    RCA throbectomy 2014  . Chronic renal insufficiency, stage III (moderate)   . Esophageal stricture   .  Hypertension   . Hypothyroidism   . Myocardial infarction ([place]) 03/12/13   Inferior MI.  LAD mid 80% stenosis, circ mild plaque, RCA 100% with thrombectomy PTCA.  EF 65%  . Osteoporosis   . PC (prostate cancer) ([place]) 1987  . Rectum cancer ([place]) 1986  . Renal cell cancer ([facility])     Tobacco Use: History  Smoking Status  . Former Smoker  . Packs/day: 0.50  . Years: 10.00  . Types: Cigarettes  . Start date: 10/21/1946  . Quit date: 10/21/1968  Smokeless Tobacco  . Never Used    Labs: Recent Review Flowsheet Data    Labs for ITP Cardiac and Pulmonary Rehab Latest Ref Rng & Units 12/08/2015 12/08/2015 12/09/2015 12/09/2015 02/09/2016   Cholestrol 100 - 199 mg/dL - - - - 142   LDLCALC 0 - 99 mg/dL - - - - -   HDL >39 mg/dL - - - - 66   Trlycerides 0 - 149 mg/dL - - - - 87   Hemoglobin A1c 4.8 - 5.6 % - - - - -   PHART 7.350 - 7.450 7.287(L) - - - -   PCO2ART 35.0 - 45.0 mmHg 45.0 - - - -   HCO3 20.0 - 24.0 mEq/L 21.6 - - - -   TCO2 0 - 100 mmol/L 23 22 22 23  -   ACIDBASEDEF 0.0 - 2.0 mmol/L 5.0(H) - - - -   O2SAT % 99.0 - - - -  Capillary Blood Glucose: Lab Results  Component Value Date   GLUCAP 86 12/12/2015   GLUCAP 83 12/11/2015   GLUCAP 118 (H) 12/11/2015   GLUCAP 95 12/11/2015   GLUCAP 87 12/11/2015     Exercise Target Goals:    Exercise Program Goal: Individual exercise prescription set with THRR, safety & activity barriers. Participant demonstrates ability to understand and report RPE using BORG scale, to self-measure pulse accurately, and to acknowledge the importance of the exercise prescription.  Exercise Prescription Goal: Starting with aerobic activity 30 plus minutes a day, 3 days per week for initial exercise prescription. Provide home exercise prescription and guidelines that participant acknowledges understanding prior to discharge.  Activity Barriers & Risk Stratification:     Activity Barriers & Cardiac Risk Stratification - 02/23/16 1412       Activity Barriers & Cardiac Risk Stratification   Activity Barriers Deconditioning;Balance Concerns;Left Knee Replacement   Cardiac Risk Stratification High      6 Minute Walk:     6 Minute Walk    Row Name 02/23/16 1818         6 Minute Walk   Phase Initial     Distance 900 feet     Walk Time 6 minutes     # of Rest Breaks 0     MPH 1.7     METS 2.3     RPE 12     Perceived Dyspnea  13     VO2 Peak 5.59     Symptoms No     Resting HR 56 bpm     Resting BP 112/80     Max Ex. HR 70 bpm     Max Ex. BP 148/90     2 Minute Post BP 138/86        Initial Exercise Prescription:     Initial Exercise Prescription - 02/23/16 1800      Date of Initial Exercise RX and Referring Provider   Date 02/23/16   Referring Provider Dr. Percival Spanish     NuStep   Level 2   Watts 25   Minutes 15   METs 1.9     Arm Ergometer   Level 2.5   Watts 16   Minutes 15   METs 2.5     Prescription Details   Frequency (times per week) 3   Duration Progress to 30 minutes of continuous aerobic without signs/symptoms of physical distress     Intensity   THRR REST +  30   THRR 40-80% of Max Heartrate 85-100-114   Ratings of Perceived Exertion 11-13   Perceived Dyspnea 0-4     Progression   Progression Continue to progress workloads to maintain intensity without signs/symptoms of physical distress.     Resistance Training   Training Prescription Yes   Weight 1   Reps 10-12      Perform Capillary Blood Glucose checks as needed.  Exercise Prescription Changes:      Exercise Prescription Changes    Row Name 03/16/16 1200 03/27/16 1400 04/03/16 1300 04/10/16 1500 04/22/16 1200     Exercise Review   Progression Yes Yes Yes Yes Yes     Response to Exercise   Blood Pressure (Admit) 138/70 122/82 122/82 150/88 130/90   Blood Pressure (Exercise) 162/90 146/82 146/82 158/90 180/90   Blood Pressure (Exit) 118/62 120/80 120/80 142/64 108/60   Heart Rate (Admit) 70 bpm 56 bpm 56 bpm  51 bpm 73 bpm   Heart Rate (Exercise) 107 bpm  93 bpm 93 bpm 107 bpm 106 bpm   Heart Rate (Exit) 79 bpm 65 bpm 65 bpm 90 bpm 79 bpm   Rating of Perceived Exertion (Exercise) 13 13 13 13 12    Symptoms No No No No No   Duration Progress to 30 minutes of continuous aerobic without signs/symptoms of physical distress Progress to 30 minutes of continuous aerobic without signs/symptoms of physical distress Progress to 30 minutes of continuous aerobic without signs/symptoms of physical distress Progress to 30 minutes of continuous aerobic without signs/symptoms of physical distress Progress to 30 minutes of continuous aerobic without signs/symptoms of physical distress   Intensity Rest + 30 Rest + 30 Rest + 30 Rest + 30 Rest + 30     Progression   Progression Continue to progress workloads to maintain intensity without signs/symptoms of physical distress. Continue to progress workloads to maintain intensity without signs/symptoms of physical distress. Continue to progress workloads to maintain intensity without signs/symptoms of physical distress. Continue to progress workloads to maintain intensity without signs/symptoms of physical distress. Continue to progress workloads to maintain intensity without signs/symptoms of physical distress.     Resistance Training   Training Prescription Yes Yes Yes Yes Yes   Weight 1 1 1 1  2.0   Reps 10-12 10-12 10-12 10-12 10-12     NuStep   Level 2 2 2 2 3    Watts 20 20 20 20  34   Minutes 15 15 15 15 15    METs 2 2 2 2  3.5     Arm Ergometer   Level 2.6 2.7 2.8 2.8 2.8   Watts 23 23 23 23 29    Minutes 15 15 15 15 20    METs 2.7 2.7 2.7 2.7 3.2     Home Exercise Plan   Plans to continue exercise at [place]   Frequency Add 2 additional days to program exercise sessions. Add 2 additional days to program exercise sessions. Add 2 additional days to program exercise sessions. Add 2 additional days to program exercise sessions. Add 2 additional days to  program exercise sessions.   [place] Name 05/06/16 1200             Exercise Review   Progression Yes         Response to Exercise   Blood Pressure (Admit) 130/80       Blood Pressure (Exercise) 162/90       Blood Pressure (Exit) 118/62       Heart Rate (Admit) 58 bpm       Heart Rate (Exercise) 107 bpm       Heart Rate (Exit) 67 bpm       Rating of Perceived Exertion (Exercise) 12       Symptoms No       Duration Progress to 30 minutes of continuous aerobic without signs/symptoms of physical distress       Intensity Rest + 30         Progression   Progression Continue to progress workloads to maintain intensity without signs/symptoms of physical distress.         Resistance Training   Training Prescription Yes       Weight 2.0       Reps 10-12         NuStep   Level 3       Watts 32       Minutes 15       METs 3.5  Arm Ergometer   Level 2.9       Watts 31       Minutes 20       METs 3.4         Home Exercise Plan   Plans to continue exercise at Home       Frequency Add 2 additional days to program exercise sessions.          Exercise Comments:      Exercise Comments    Row Name 05/14/16 [phone]           Exercise Comments Patient is progressing appropriately.           Discharge Exercise Prescription (Final Exercise Prescription Changes):     Exercise Prescription Changes - 05/06/16 1200      Exercise Review   Progression Yes     Response to Exercise   Blood Pressure (Admit) 130/80   Blood Pressure (Exercise) 162/90   Blood Pressure (Exit) 118/62   Heart Rate (Admit) 58 bpm   Heart Rate (Exercise) 107 bpm   Heart Rate (Exit) 67 bpm   Rating of Perceived Exertion (Exercise) 12   Symptoms No   Duration Progress to 30 minutes of continuous aerobic without signs/symptoms of physical distress   Intensity Rest + 30     Progression   Progression Continue to progress workloads to maintain intensity without signs/symptoms of physical  distress.     Resistance Training   Training Prescription Yes   Weight 2.0   Reps 10-12     NuStep   Level 3   Watts 32   Minutes 15   METs 3.5     Arm Ergometer   Level 2.9   Watts 31   Minutes 20   METs 3.4     Home Exercise Plan   Plans to continue exercise at Home   Frequency Add 2 additional days to program exercise sessions.      Nutrition:  Target Goals: Understanding of nutrition guidelines, daily intake of sodium 1500mg , cholesterol 200mg , calories 30% from fat and 7% or less from saturated fats, daily to have 5 or more servings of fruits and vegetables.  Biometrics:     Pre Biometrics - 02/23/16 1829      Pre Biometrics   Height 5\' 6"  (1.676 m)   Weight 163 lb 11.2 oz (74.3 kg)   Waist Circumference 36 inches   Hip Circumference 38 inches   Waist to Hip Ratio 0.95 %   BMI (Calculated) 26.5   Triceps Skinfold 5 mm   % Body Fat 21.8 %   Grip Strength 60.6 kg   Flexibility 0 in   Single Leg Stand 5 seconds       Nutrition Therapy Plan and Nutrition Goals:   Nutrition Discharge: Rate Your Plate Scores:     Nutrition Assessments - 02/23/16 1516      MEDFICTS Scores   Pre Score 67      Nutrition Goals Re-Evaluation:   Psychosocial: Target Goals: Acknowledge presence or absence of depression, maximize coping skills, provide positive support system. Participant is able to verbalize types and ability to use techniques and skills needed for reducing stress and depression.  Initial Review & Psychosocial Screening:     Initial Psych Review & Screening - 02/23/16 [place]? Yes     Barriers   Psychosocial barriers to participate in program There are no identifiable barriers or psychosocial  needs.     Screening Interventions   Interventions Encouraged to exercise      Quality of Life Scores:     Quality of Life - 02/23/16 1831      Quality of Life Scores   Health/Function Pre 23.63 %    Socioeconomic Pre 29.17 %   Psych/Spiritual Pre 28.93 %   Family Pre 30 %   GLOBAL Pre 27.03 %      PHQ-9: Recent Review Flowsheet Data    Depression screen [facility] 2/9 02/23/2016 02/13/2016 11/07/2015 11/01/2015 10/26/2015   Decreased Interest 0 0 0 0 0   Down, Depressed, Hopeless 0 0 0 0 0   PHQ - 2 Score 0 0 0 0 0   Altered sleeping 0 - - - -   Tired, decreased energy 0 - - - -   Change in appetite 1 - - - -   Feeling bad or failure about yourself  0 - - - -   Trouble concentrating 1 - - - -   Moving slowly or fidgety/restless 0 - - - -   Suicidal thoughts 0 - - - -   PHQ-9 Score 2 - - - -   Difficult doing work/chores Not difficult at all - - - -      Psychosocial Evaluation and Intervention:     Psychosocial Evaluation - 02/23/16 1517      Psychosocial Evaluation & Interventions   Interventions Relaxation education;Stress management education;Encouraged to exercise with the program and follow exercise prescription   Continued Psychosocial Services Needed No      Psychosocial Re-Evaluation:     Psychosocial Re-Evaluation    Row Name 03/11/16 1423 04/15/16 1158 05/15/16 1419         Psychosocial Re-Evaluation   Interventions Encouraged to attend Cardiac Rehabilitation for the exercise Relaxation education;Encouraged to attend Cardiac Rehabilitation for the exercise;Stress management education  -     Comments  -  - Patient does not need counseling. QOL score is 27.03.      Continued Psychosocial Services Needed No No No        Vocational Rehabilitation: Provide vocational rehab assistance to qualifying candidates.   Vocational Rehab Evaluation & Intervention:     Vocational Rehab - 02/23/16 1514      Initial Vocational Rehab Evaluation & Intervention   Assessment shows need for Vocational Rehabilitation No      Education: Education Goals: Education classes will be provided on a weekly basis, covering required topics. Participant will state understanding/return  demonstration of topics presented.  Learning Barriers/Preferences:     Learning Barriers/Preferences - 02/23/16 1513      Learning Barriers/Preferences   Learning Barriers None   Learning Preferences Written Material      Education Topics: Hypertension, Hypertension Reduction -Define heart disease and high blood pressure. Discus how high blood pressure affects the body and ways to reduce high blood pressure.   Exercise and Your Heart -Discuss why it is important to exercise, the FITT principles of exercise, normal and abnormal responses to exercise, and how to exercise safely.   Angina -Discuss definition of angina, causes of angina, treatment of angina, and how to decrease risk of having angina. Flowsheet Row CARDIAC REHAB PHASE II EXERCISE from 05/08/2016 in [place]  Date  03/06/16  Educator  -- [D Coad]  Instruction Review Code  2- meets goals/outcomes      Cardiac Medications -Review what the following cardiac medications are used for, how they affect  the body, and side effects that may occur when taking the medications.  Medications include Aspirin, Beta blockers, calcium channel blockers, ACE Inhibitors, angiotensin receptor blockers, diuretics, digoxin, and antihyperlipidemics.   Congestive Heart Failure -Discuss the definition of CHF, how to live with CHF, the signs and symptoms of CHF, and how keep track of weight and sodium intake. Flowsheet Row CARDIAC REHAB PHASE II EXERCISE from 05/08/2016 in [place]  Date  03/20/16  Educator  DC  Instruction Review Code  2- meets goals/outcomes      Heart Disease and Intimacy -Discus the effect sexual activity has on the heart, how changes occur during intimacy as we age, and safety during sexual activity. Flowsheet Row CARDIAC REHAB PHASE II EXERCISE from 05/08/2016 in [place]  Date  03/27/16  Educator  DC  Instruction Review Code  2- meets  goals/outcomes      Smoking Cessation / COPD -Discuss different methods to quit smoking, the health benefits of quitting smoking, and the definition of COPD. Flowsheet Row CARDIAC REHAB PHASE II EXERCISE from 05/08/2016 in [place]  Date  04/05/16  Educator  Russella Dar  Instruction Review Code  2- meets goals/outcomes      Nutrition I: Fats -Discuss the types of cholesterol, what cholesterol does to the heart, and how cholesterol levels can be controlled.   Nutrition II: Labels -Discuss the different components of food labels and how to read food label [place] from 05/08/2016 in [place]  Date  04/17/16  Educator  Russella Dar  Instruction Review Code  2- meets goals/outcomes      Heart Parts and Heart Disease -Discuss the anatomy of the heart, the pathway of blood circulation through the heart, and these are affected by heart disease. Flowsheet Row CARDIAC REHAB PHASE II EXERCISE from 05/08/2016 in [place]  Date  04/24/16  Educator  DC  Instruction Review Code  2- meets goals/outcomes      Stress I: Signs and Symptoms -Discuss the causes of stress, how stress may lead to anxiety and depression, and ways to limit stress. Flowsheet Row CARDIAC REHAB PHASE II EXERCISE from 05/08/2016 in [place]  Date  05/01/16  Educator  DJ  Instruction Review Code  2- meets goals/outcomes      Stress II: Relaxation -Discuss different types of relaxation techniques to limit stress. Flowsheet Row CARDIAC REHAB PHASE II EXERCISE from 05/08/2016 in [place]  Date  05/08/16  Educator  DC/DJ  Instruction Review Code  2- meets goals/outcomes      Warning Signs of Stroke / TIA -Discuss definition of a stroke, what the signs and symptoms are of a stroke, and how to identify when someone is having stroke.   Knowledge Questionnaire  Score:     Knowledge Questionnaire Score - 02/23/16 1514      Knowledge Questionnaire Score   Pre Score 20/24      Core Components/Risk Factors/Patient Goals at Admission:     Personal Goals and Risk Factors at Admission - 02/23/16 1516      Core Components/Risk Factors/Patient Goals on Admission   Improve shortness of breath with ADL's Yes   Intervention Provide education, individualized exercise plan and daily activity instruction to help decrease symptoms of SOB with activities of daily living.   Expected Outcomes Short Term: Achieves a reduction of symptoms when performing activities of daily living.  Core Components/Risk Factors/Patient Goals Review:      Goals and Risk Factor Review    Row Name 02/23/16 1516 03/11/16 1422 04/15/16 1158 05/15/16 1416       Core Components/Risk Factors/Patient Goals Review   Personal Goals Review Improve shortness of breath with ADL's Improve shortness of breath with ADL's Improve shortness of breath with ADL's Increase Strength and Stamina;Other    Review  - Patient is progressing well in the program.  No obvious SOB noted with exercise.  Patient is progressing well in the program.  No obvious SOB noted with exercise.  Patient is progressing well in the program. His energy has increased and he is breathing better.     Expected Outcomes  - decrease SOB with ADL's decrease SOB with ADL's Continue to increase energy and to breathe getter.        Core Components/Risk Factors/Patient Goals at Discharge (Final Review):      Goals and Risk Factor Review - 05/15/16 1416      Core Components/Risk Factors/Patient Goals Review   Personal Goals Review Increase Strength and Stamina;Other   Review Patient is progressing well in the program. His energy has increased and he is breathing better.    Expected Outcomes Continue to increase energy and to breathe getter.       ITP Comments:     ITP Comments    Row Name 02/23/16 1514 03/22/16  0900         ITP Comments Pt is a 82 year old male living with spouse and enjoys playing golf.  Patient does not exhibit any s/s of depression at this time.  Patient meet with the Dietician in our group heart healthy guidelines class at 9am. Group was shown how to create a heart healthy plate, how to make healthy choices while dining out, how to choose healthy fats , proteins, carbohydrates and the correct amount of sodium to use.          Comments: Patient doing well with program. Will continue to monitor for progress.

## 2016-05-17 ENCOUNTER — Encounter (HOSPITAL_COMMUNITY)
Admission: RE | Admit: 2016-05-17 | Discharge: 2016-05-17 | Disposition: A | Discharge status: Home / Self Care | Payer: Medicare Other | Source: Ambulatory Visit | Attending: Cardiology | Admitting: Cardiology

## 2016-05-17 DIAGNOSIS — Z951 Presence of aortocoronary bypass graft: Secondary | ICD-10-CM | POA: Diagnosis not present

## 2016-05-17 NOTE — Progress Notes (Signed)
Daily Session Note  Patient Details  Name: Frederick Davis MRN: [id number] Date of Birth: 1923-10-16 Referring Provider:   Flowsheet Row CARDIAC REHAB PHASE II ORIENTATION from 02/23/2016 in [place]  Referring Provider  Dr. Percival Spanish      Encounter Date: 05/17/2016  Check In:     Session Check In - 05/17/16 0815      Check-In   Location [facility]   Staff Present Diane Angelina Pih, MS, EP, [facility], Exercise Physiologist;Gregory Luther Parody, BS, EP, Exercise Physiologist;Monti Villers Wynetta Emery, RN, BSN   Supervising physician immediately available to respond to emergencies See telemetry face sheet for immediately available MD   Medication changes reported     No   Fall or balance concerns reported    No   Warm-up and Cool-down Performed as group-led instruction   Resistance Training Performed Yes   VAD Patient? No     Pain Assessment   Currently in Pain? No/denies   Pain Score 0-No pain   Multiple Pain Sites No      Capillary Blood Glucose: No results found for this or any previous visit (from the past 24 hour(s)).   Goals Met:  Independence with exercise equipment Exercise tolerated well No report of cardiac concerns or symptoms Strength training completed today  Goals Unmet:  Not Applicable  Comments: Check out 915.   Dr. Kate Sable is Medical Director for [place].

## 2016-05-20 ENCOUNTER — Encounter (HOSPITAL_COMMUNITY)
Admission: RE | Admit: 2016-05-20 | Discharge: 2016-05-20 | Disposition: A | Discharge status: Home / Self Care | Payer: Medicare Other | Source: Ambulatory Visit | Attending: Cardiology | Admitting: Cardiology

## 2016-05-20 DIAGNOSIS — Z951 Presence of aortocoronary bypass graft: Secondary | ICD-10-CM

## 2016-05-20 NOTE — Progress Notes (Signed)
Daily Session Note  Patient Details  Name: PHILLIPS GOULETTE MRN: [id number] Date of Birth: 1922/12/04 Referring Provider:   Flowsheet Row CARDIAC REHAB PHASE II ORIENTATION from 02/23/2016 in [place]  Referring Provider  Dr. Percival Spanish      Encounter Date: 05/20/2016  Check In:     Session Check In - 05/20/16 0815      Check-In   Location [facility]   Staff Present Russella Dar, MS, EP, [facility], Exercise Physiologist;Debra Wynetta Emery, RN, BSN;Gavina Dildine, BS, EP, Exercise Physiologist   Supervising physician immediately available to respond to emergencies See telemetry face sheet for immediately available MD   Medication changes reported     No   Fall or balance concerns reported    No   Warm-up and Cool-down Performed as group-led instruction   Resistance Training Performed Yes   VAD Patient? No     Pain Assessment   Currently in Pain? Yes   Pain Score 8    Pain Location Knee   Multiple Pain Sites No      Capillary Blood Glucose: No results found for this or any previous visit (from the past 24 hour(s)).   Goals Met:  Independence with exercise equipment Exercise tolerated well No report of cardiac concerns or symptoms Strength training completed today  Goals Unmet:  Not Applicable  Comments: Check out 915   Dr. Kate Sable is Medical Director for [place].

## 2016-05-22 ENCOUNTER — Encounter (HOSPITAL_COMMUNITY)
Admission: RE | Admit: 2016-05-22 | Discharge: 2016-05-22 | Disposition: A | Discharge status: Home / Self Care | Payer: Medicare Other | Source: Ambulatory Visit | Attending: Cardiology | Admitting: Cardiology

## 2016-05-22 DIAGNOSIS — Z951 Presence of aortocoronary bypass graft: Secondary | ICD-10-CM | POA: Insufficient documentation

## 2016-05-22 NOTE — Progress Notes (Signed)
Daily Session Note  Patient Details  Name: Frederick Davis MRN: [id number] Date of Birth: 08-20-1923 Referring Provider:   Flowsheet Row CARDIAC REHAB PHASE II ORIENTATION from 02/23/2016 in [place]  Referring Provider  Dr. Percival Spanish      Encounter Date: 05/22/2016  Check In:     Session Check In - 05/22/16 0837      Check-In   Location [facility]   Staff Present Russella Dar, MS, EP, [facility], Exercise Physiologist;Debra Wynetta Emery, RN, BSN;Gregory Cowan, BS, EP, Exercise Physiologist   Supervising physician immediately available to respond to emergencies See telemetry face sheet for immediately available MD   Medication changes reported     No   Fall or balance concerns reported    No   Warm-up and Cool-down Performed as group-led instruction   Resistance Training Performed Yes   VAD Patient? No     Pain Assessment   Currently in Pain? Yes   Pain Score 8    Pain Location Knee   Pain Orientation Left   Pain Descriptors / Indicators Aching   Pain Type Chronic pain   Pain Onset Today   Pain Frequency Constant      Capillary Blood Glucose: No results found for this or any previous visit (from the past 24 hour(s)).   Goals Met:  Independence with exercise equipment Exercise tolerated well No report of cardiac concerns or symptoms Strength training completed today  Goals Unmet:  Not Applicable  Comments: Check out: 0915    Dr. Kate Sable is Medical Director for [place] and Pulmonary Rehab.

## 2016-05-24 ENCOUNTER — Encounter (HOSPITAL_COMMUNITY)
Admission: RE | Admit: 2016-05-24 | Discharge: 2016-05-24 | Disposition: A | Discharge status: Home / Self Care | Payer: Medicare Other | Source: Ambulatory Visit | Attending: Cardiology | Admitting: Cardiology

## 2016-05-24 DIAGNOSIS — Z951 Presence of aortocoronary bypass graft: Secondary | ICD-10-CM | POA: Diagnosis not present

## 2016-05-26 DIAGNOSIS — R030 Elevated blood-pressure reading, without diagnosis of hypertension: Secondary | ICD-10-CM | POA: Diagnosis not present

## 2016-05-27 ENCOUNTER — Encounter (HOSPITAL_COMMUNITY): Payer: Medicare Other

## 2016-05-29 ENCOUNTER — Encounter (HOSPITAL_COMMUNITY): Payer: Medicare Other

## 2016-05-29 DIAGNOSIS — M1712 Unilateral primary osteoarthritis, left knee: Secondary | ICD-10-CM | POA: Diagnosis not present

## 2016-05-29 NOTE — Progress Notes (Signed)
HPI The patient presents for followup after a acute inferior wall infarct in May of 2014. He had thrombectomy of a right coronary artery occlusion. He was initially acute respiratory failure and was cooled. However, he responded very well to therapy.   Earlier this year the patient was having chest pain.  He was sent for cardiac catheterization. This demonstrated severe LAD stenosis and RCA stenosis. He subsequently underwent a CABG 3 on February 17.    He called to be added to the schedule because he didn't feel well on Sunday.  He just felt fatigued at church. The blood pressure and it was in the [id number] systolic and diastolic was he thinks [id number]. He ultimately went to the urgent care in [place] and his blood pressure was elevated in both arms though was more as he recalls in the 150s. He's been taking it at home since then and it has been running elevated and I reviewed his blood pressure diary. He feels a little "woozy" but is not having any presyncope or syncope. He's not having any chest pressure, neck or arm discomfort. He does get some mild shortness of breath and is not having any PND or orthopnea. He still doing his cardiac rehabilitation.  Allergies  Allergen Reactions  . Lipitor [Atorvastatin] Other (See Comments)    Muscle aches    Prior to Admission medications   Medication Sig Start Date End Date Taking? Authorizing Provider  acetaminophen (TYLENOL) 500 MG tablet Take 500 mg by mouth at bedtime.   Yes Historical Provider, MD  aspirin 81 MG tablet Take 1 tablet (81 mg total) by mouth daily. 03/18/13  Yes Rhonda G Barrett, PA-C  carvedilol (COREG) 3.125 MG tablet Take 1 tablet (3.125 mg total) by mouth 2 (two) times daily with a meal. 04/19/16  Yes Chipper Herb, MD  furosemide (LASIX) 20 MG tablet Take 1 tablet daily as needed. 04/03/16  Yes Minus Breeding, MD  levothyroxine (SYNTHROID, LEVOTHROID) 75 MCG tablet Take 75 mcg by mouth daily before breakfast.   Yes Historical Provider, MD    Misc Natural Products (OSTEO BI-FLEX/5-LOXIN ADVANCED PO) Take 1 tablet by mouth daily.   Yes Historical Provider, MD  Multiple Vitamin (MULTIVITAMIN) tablet Take 1 tablet by mouth daily.   Yes Historical Provider, MD  nitroGLYCERIN (NITROSTAT) 0.4 MG SL tablet PLACE 1 TABLET UNDER THE TONGUE AT ONSET OF CHEST PAIN EVERY 5 MINTUES UP TO 3 TIMES AS NEEDED 03/12/16  Yes Chipper Herb, MD     Past Medical History:  Diagnosis Date  . Anemia   . CAD (coronary artery disease)    RCA throbectomy 2014  . Chronic renal insufficiency, stage III (moderate)   . Esophageal stricture   . Hypertension   . Hypothyroidism   . Myocardial infarction ([place]) 03/12/13   Inferior MI.  LAD mid 80% stenosis, circ mild plaque, RCA 100% with thrombectomy PTCA.  EF 65%  . Osteoporosis   . PC (prostate cancer) ([place]) 1987  . Rectum cancer ([place]) 1986  . Renal cell cancer [facility])     Past Surgical History:  Procedure Laterality Date  . CARDIAC CATHETERIZATION N/A 12/06/2015   Procedure: Left Heart Cath and Coronary Angiography;  Surgeon: Burnell Blanks, MD;  Location: [place] [facility];  Service: Cardiovascular;  Laterality: N/A;  . COLONOSCOPY  01/08/2012   Procedure: COLONOSCOPY;  Surgeon: Rogene Houston, MD;  [facility];  Service: Endoscopy;  Laterality: N/A;  1030  . COLONOSCOPY N/A 07/20/2015  Procedure: COLONOSCOPY;  Surgeon: Rogene Houston, MD;  [facility];  Service: Endoscopy;  Laterality: N/A;  930 - moved to 9/29 @ 2:00  . COLOSTOMY Left 1986   colon cancer  . CORONARY ARTERY BYPASS GRAFT N/A 12/08/2015   Procedure: CORONARY ARTERY BYPASS GRAFTING (CABG);  Surgeon: Melrose Nakayama, MD;  Location: [facility];  Service: Open Heart Surgery;  Laterality: N/A;  Times 3 using left internal mammary artery and endoscopycally harvested bilateral saphenous vein.  [facility] HERNIA REPAIR     2007  . KIDNEY SURGERY  2004  . LEFT HEART CATHETERIZATION WITH CORONARY ANGIOGRAM N/A 03/12/2013    Procedure: STEMI;  Surgeon: Burnell Blanks, MD;  Location: [facility];  Service: Cardiovascular;  Laterality: N/A;  . PERCUTANEOUS CORONARY INTERVENTION-BALLOON ONLY    . PROSTATECTOMY    . [place]  2006  . renal sphincter appliance  2007  . Right inguinal hernia repair    . TEE WITHOUT CARDIOVERSION N/A 12/08/2015   Procedure: TRANSESOPHAGEAL ECHOCARDIOGRAM (TEE);  Surgeon: Melrose Nakayama, MD;  Location: [place];  Service: Open Heart Surgery;  Laterality: N/A;    ROS:  As stated in the HPI and negative for all other systems.  PHYSICAL EXAM BP (!) 170/77 (BP Location: Right Arm, Cuff Size: Normal)   Pulse (!) 56   Ht 5\' 6"  (1.676 m)   Wt 163 lb (73.9 kg)   SpO2 100% Comment: on room air  BMI 26.31 kg/m  GENERAL:  Well appearing, uncomfortable from his back NECK:  No jugular venous distention, waveform within normal limits, carotid upstroke brisk and symmetric, no bruits, no thyromegaly CHEST: Healed sternotomy scar. LUNGS:  Clear to auscultation bilaterally BACK:  No CVA tenderness, mild lordosis CHEST:  Unremarkable HEART:  PMI not displaced or sustained,S1 and S2 within normal limits, no S3, no S4, no clicks, no rubs, 3/6 apical systolic murmur radiating out the outflow tract, no diastolic murmurs ABD:  Flat, positive bowel sounds normal in frequency in pitch, no bruits, no rebound, no guarding, no midline pulsatile mass, no hepatomegaly, no splenomegaly, , colostomy bag in place EXT:  2 plus pulses throughout, mild bilateral ankle edema, no cyanosis no clubbing   ASSESSMENT AND PLAN  CAD/CABG:  He continues to recover.   He is about to finish cardiac rehab.    HTN:  The blood pressure is elevated.  I will start Norvasc 2.5 mg.  He did tolerate this med in the past.   DYSLIPIDEMIA:  He has been intolerant of 1 statin in the past. I will consider trying a different one in the future.   However, given the current complaints I will not start  this at this time.   AORTIC STENOSIS:  This is moderate and he will be followed clinically.    EDEMA:  This is improved compared to previous.

## 2016-05-30 ENCOUNTER — Ambulatory Visit (INDEPENDENT_AMBULATORY_CARE_PROVIDER_SITE_OTHER): Payer: Medicare Other | Admitting: Cardiology

## 2016-05-30 ENCOUNTER — Encounter: Payer: Self-pay | Admitting: Cardiology

## 2016-05-30 VITALS — BP 170/77 | HR 56 | Ht 66.0 in | Wt 163.0 lb

## 2016-05-30 DIAGNOSIS — I2510 Atherosclerotic heart disease of native coronary artery without angina pectoris: Secondary | ICD-10-CM

## 2016-05-30 DIAGNOSIS — I10 Essential (primary) hypertension: Secondary | ICD-10-CM

## 2016-05-30 MED ORDER — AMLODIPINE BESYLATE 2.5 MG PO TABS: 2.5000 mg | ORAL_TABLET | Freq: Every day | ORAL | 6 refills | Status: DC

## 2016-05-30 NOTE — Patient Instructions (Addendum)
Medication Instructions:   Begin Norvasc 2.5mg  daily.  Continue all other current medications.  Labwork: none  Testing/Procedures: none  Follow-Up: 1 month - [name]   Any Other Special Instructions Will Be Listed Below (If Applicable).  If you need a refill on your cardiac medications before your next appointment, please call your pharmacy.

## 2016-05-31 ENCOUNTER — Encounter (HOSPITAL_COMMUNITY)
Admission: RE | Admit: 2016-05-31 | Discharge: 2016-05-31 | Disposition: A | Discharge status: Home / Self Care | Payer: Medicare Other | Source: Ambulatory Visit | Attending: Cardiology | Admitting: Cardiology

## 2016-05-31 DIAGNOSIS — Z951 Presence of aortocoronary bypass graft: Secondary | ICD-10-CM

## 2016-05-31 NOTE — Progress Notes (Signed)
Daily Session Note  Patient Details  Name: Frederick Davis MRN: [id number] Date of Birth: 1923/06/01 Referring Provider:   Flowsheet Row CARDIAC REHAB PHASE II ORIENTATION from 02/23/2016 in [place]  Referring Provider  Dr. Percival Spanish      Encounter Date: 05/31/2016  Check In:     Session Check In - 05/31/16 0815      Check-In   Location [facility]   Staff Present Diane Angelina Pih, MS, EP, [facility], Exercise Physiologist;Debra Wynetta Emery, RN, BSN;Kaitelyn Jamison, BS, EP, Exercise Physiologist   Supervising physician immediately available to respond to emergencies See telemetry face sheet for immediately available MD   Medication changes reported     Yes   Comments 2.45m Norvasc   Fall or balance concerns reported    No   Warm-up and Cool-down Performed as group-led instruction   Resistance Training Performed Yes   VAD Patient? No     Pain Assessment   Currently in Pain? No/denies   Pain Score 0-No pain   Multiple Pain Sites No      Capillary Blood Glucose: No results found for this or any previous visit (from the past 24 hour(s)).   Goals Met:  Independence with exercise equipment Exercise tolerated well No report of cardiac concerns or symptoms Strength training completed today  Goals Unmet:  Not Applicable  Comments: Check out 915   Dr. SKate Sableis Medical Director for [facility].

## 2016-06-03 ENCOUNTER — Encounter (HOSPITAL_COMMUNITY)
Admission: RE | Admit: 2016-06-03 | Discharge: 2016-06-03 | Disposition: A | Discharge status: Home / Self Care | Payer: Medicare Other | Source: Ambulatory Visit | Attending: Cardiology | Admitting: Cardiology

## 2016-06-03 VITALS — Ht 66.0 in | Wt 164.0 lb

## 2016-06-03 DIAGNOSIS — Z951 Presence of aortocoronary bypass graft: Secondary | ICD-10-CM

## 2016-06-03 NOTE — Progress Notes (Signed)
Daily Session Note  Patient Details  Name: Frederick Davis MRN: [id number] Date of Birth: 1923/02/05 Referring Provider:   Flowsheet Row CARDIAC REHAB PHASE II ORIENTATION from 02/23/2016 in [place]  Referring Provider  Dr. Percival Spanish      Encounter Date: 06/03/2016  Check In:     Session Check In - 06/03/16 0815      Check-In   Location [facility]   Staff Present Russella Dar, MS, EP, [facility], Exercise Physiologist;Mykel Mohl Luther Parody, BS, EP, Exercise Physiologist   Supervising physician immediately available to respond to emergencies See telemetry face sheet for immediately available MD   Medication changes reported     No   Fall or balance concerns reported    No   Warm-up and Cool-down Performed as group-led instruction   Resistance Training Performed Yes   VAD Patient? No     Pain Assessment   Currently in Pain? No/denies   Pain Score 0-No pain   Multiple Pain Sites No      Capillary Blood Glucose: No results found for this or any previous visit (from the past 24 hour(s)).   Goals Met:  Independence with exercise equipment Exercise tolerated well No report of cardiac concerns or symptoms Strength training completed today  Goals Unmet:  Not Applicable  Comments: Check out 915   Dr. Kate Sable is Medical Director for [place] and [facility].

## 2016-06-05 ENCOUNTER — Encounter (HOSPITAL_COMMUNITY): Payer: Medicare Other

## 2016-06-06 NOTE — Progress Notes (Signed)
Cardiac Individual Treatment Plan  Patient Details  Name: Frederick Davis MRN: [id number] Date of Birth: [birth year] Referring Provider:   Flowsheet Row CARDIAC REHAB PHASE II ORIENTATION from 02/23/2016 in [facility] CARDIAC REHABILITATION  Referring Provider  Dr. Antoine Poche      Initial Encounter Date:  Flowsheet Row CARDIAC REHAB PHASE II ORIENTATION from 02/23/2016 in [place] CARDIAC REHABILITATION  Date  02/23/16  Referring Provider  Dr. Antoine Poche      Visit Diagnosis: S/P CABG x 3  Patient's Home Medications on Admission:  Current Outpatient Prescriptions:  .  acetaminophen (TYLENOL) 500 MG tablet, Take 500 mg by mouth at bedtime., Disp: , Rfl:  .  amLODipine (NORVASC) 2.5 MG tablet, Take 1 tablet (2.5 mg total) by mouth daily., Disp: 30 tablet, Rfl: 6 .  aspirin 81 MG tablet, Take 1 tablet (81 mg total) by mouth daily., Disp: 30 tablet, Rfl:  .  carvedilol (COREG) 3.125 MG tablet, Take 1 tablet (3.125 mg total) by mouth 2 (two) times daily with a meal., Disp: 60 tablet, Rfl: 1 .  furosemide (LASIX) 20 MG tablet, Take 1 tablet daily as needed., Disp: 30 tablet, Rfl: 6 .  levothyroxine (SYNTHROID, LEVOTHROID) 75 MCG tablet, Take 75 mcg by mouth daily before breakfast., Disp: , Rfl:  .  Misc Natural Products (OSTEO BI-FLEX/5-LOXIN ADVANCED PO), Take 1 tablet by mouth daily., Disp: , Rfl:  .  Multiple Vitamin (MULTIVITAMIN) tablet, Take 1 tablet by mouth daily., Disp: , Rfl:  .  nitroGLYCERIN (NITROSTAT) 0.4 MG SL tablet, PLACE 1 TABLET UNDER THE TONGUE AT ONSET OF CHEST PAIN EVERY 5 MINTUES UP TO 3 TIMES AS NEEDED, Disp: 25 tablet, Rfl: 0 No current facility-administered medications for this encounter.   Facility-Administered Medications Ordered in Other Encounters:  .  etomidate (AMIDATE) injection, , , PRN, [name], CRNA, 10 mg at 03/12/13 1541 .  lidocaine (cardiac) 100 mg/71ml (XYLOCAINE) 20 MG/ML injection 2%, , , PRN, [name], CRNA, 50 mg at 12/08/15 0853 .   succinylcholine (ANECTINE) injection, , , PRN, [name], CRNA, 100 mg at 03/12/13 1541  Past Medical History: Past Medical History:  Diagnosis Date  . Anemia   . CAD (coronary artery disease)    RCA throbectomy 2014  . Chronic renal insufficiency, stage III (moderate)   . Esophageal stricture   . Hypertension   . Hypothyroidism   . Myocardial infarction (HCC) 03/12/13   Inferior MI.  LAD mid 80% stenosis, circ mild plaque, RCA 100% with thrombectomy PTCA.  EF 65%  . Osteoporosis   . PC (prostate cancer) (HCC) 1987  . Rectum cancer (HCC) 1986  . Renal cell cancer (HCC)     Tobacco Use: History  Smoking Status  . Former Smoker  . Packs/day: 0.50  . Years: 10.00  . Types: Cigarettes  . Start date: 10/21/1946  . Quit date: 10/21/1968  Smokeless Tobacco  . Never Used    Labs: Recent Review Flowsheet Data    Labs for ITP Cardiac and Pulmonary Rehab Latest Ref Rng & Units 12/08/2015 12/08/2015 12/09/2015 12/09/2015 02/09/2016   Cholestrol 100 - 199 mg/dL - - - - 657   LDLCALC 0 - 99 mg/dL - - - - -   HDL >84 mg/dL - - - - 66   Trlycerides 0 - 149 mg/dL - - - - 87   Hemoglobin A1c 4.8 - 5.6 % - - - - -   PHART 7.350 - 7.450 7.287(L) - - - -  PCO2ART 35.0 - 45.0 mmHg 45.0 - - - -   HCO3 20.0 - 24.0 mEq/L 21.6 - - - -   TCO2 0 - 100 mmol/L 23 22 22 23  -   ACIDBASEDEF 0.0 - 2.0 mmol/L 5.0(H) - - - -   O2SAT % 99.0 - - - -      Capillary Blood Glucose: Lab Results  Component Value Date   GLUCAP 86 12/12/2015   GLUCAP 83 12/11/2015   GLUCAP 118 (H) 12/11/2015   GLUCAP 95 12/11/2015   GLUCAP 87 12/11/2015     Exercise Target Goals:    Exercise Program Goal: Individual exercise prescription set with THRR, safety & activity barriers. Participant demonstrates ability to understand and report RPE using BORG scale, to self-measure pulse accurately, and to acknowledge the importance of the exercise prescription.  Exercise Prescription Goal: Starting with aerobic activity  30 plus minutes a day, 3 days per week for initial exercise prescription. Provide home exercise prescription and guidelines that participant acknowledges understanding prior to discharge.  Activity Barriers & Risk Stratification:     Activity Barriers & Cardiac Risk Stratification - 02/23/16 1412      Activity Barriers & Cardiac Risk Stratification   Activity Barriers Deconditioning;Balance Concerns;Left Knee Replacement   Cardiac Risk Stratification High      6 Minute Walk:     6 Minute Walk    Row Name 02/23/16 1818 06/03/16 1442       6 Minute Walk   Phase Initial  -    Distance 900 feet 1000 feet    Distance % Change  - 11.11 %    Walk Time 6 minutes 6 minutes    # of Rest Breaks 0 0    MPH 1.7 1.89    METS 2.3 2.44    RPE 12 12    Perceived Dyspnea  13 11    VO2 Peak 5.59 5.6    Symptoms No No    Resting HR 56 bpm 64 bpm    Resting BP 112/80 150/78    Max Ex. HR 70 bpm 100 bpm    Max Ex. BP 148/90 178/90    2 Minute Post BP 138/86 150/78       Initial Exercise Prescription:     Initial Exercise Prescription - 02/23/16 1800      Date of Initial Exercise RX and Referring Provider   Date 02/23/16   Referring Provider Dr. Antoine Poche     NuStep   Level 2   Watts 25   Minutes 15   METs 1.9     Arm Ergometer   Level 2.5   Watts 16   Minutes 15   METs 2.5     Prescription Details   Frequency (times per week) 3   Duration Progress to 30 minutes of continuous aerobic without signs/symptoms of physical distress     Intensity   THRR REST +  30   THRR 40-80% of Max Heartrate 85-100-114   Ratings of Perceived Exertion 11-13   Perceived Dyspnea 0-4     Progression   Progression Continue to progress workloads to maintain intensity without signs/symptoms of physical distress.     Resistance Training   Training Prescription Yes   Weight 1   Reps 10-12      Perform Capillary Blood Glucose checks as needed.  Exercise Prescription Changes:       Exercise Prescription Changes    Row Name 03/16/16 1200 03/27/16 1400 04/03/16 1300 04/10/16 1500  04/22/16 1200     Exercise Review   Progression Yes Yes Yes Yes Yes     Response to Exercise   Blood Pressure (Admit) 138/70 122/82 122/82 150/88 130/90   Blood Pressure (Exercise) 162/90 146/82 146/82 158/90 180/90   Blood Pressure (Exit) 118/62 120/80 120/80 142/64 108/60   Heart Rate (Admit) 70 bpm 56 bpm 56 bpm 51 bpm 73 bpm   Heart Rate (Exercise) 107 bpm 93 bpm 93 bpm 107 bpm 106 bpm   Heart Rate (Exit) 79 bpm 65 bpm 65 bpm 90 bpm 79 bpm   Rating of Perceived Exertion (Exercise) 13 13 13 13 12    Symptoms No No No No No   Duration Progress to 30 minutes of continuous aerobic without signs/symptoms of physical distress Progress to 30 minutes of continuous aerobic without signs/symptoms of physical distress Progress to 30 minutes of continuous aerobic without signs/symptoms of physical distress Progress to 30 minutes of continuous aerobic without signs/symptoms of physical distress Progress to 30 minutes of continuous aerobic without signs/symptoms of physical distress   Intensity Rest + 30 Rest + 30 Rest + 30 Rest + 30 Rest + 30     Progression   Progression Continue to progress workloads to maintain intensity without signs/symptoms of physical distress. Continue to progress workloads to maintain intensity without signs/symptoms of physical distress. Continue to progress workloads to maintain intensity without signs/symptoms of physical distress. Continue to progress workloads to maintain intensity without signs/symptoms of physical distress. Continue to progress workloads to maintain intensity without signs/symptoms of physical distress.     Resistance Training   Training Prescription Yes Yes Yes Yes Yes   Weight 1 1 1 1  2.0   Reps 10-12 10-12 10-12 10-12 10-12     NuStep   Level 2 2 2 2 3    Watts 20 20 20 20  34   Minutes 15 15 15 15 15    METs 2 2 2 2  3.5     Arm Ergometer   Level  2.6 2.7 2.8 2.8 2.8   Watts 23 23 23 23 29    Minutes 15 15 15 15 20    METs 2.7 2.7 2.7 2.7 3.2     Home Exercise Plan   Plans to continue exercise at Home Home Home Home Home   Frequency Add 2 additional days to program exercise sessions. Add 2 additional days to program exercise sessions. Add 2 additional days to program exercise sessions. Add 2 additional days to program exercise sessions. Add 2 additional days to program exercise sessions.   Row Name 05/06/16 1200 05/31/16 1200           Exercise Review   Progression Yes Yes        Response to Exercise   Blood Pressure (Admit) 130/80 142/82      Blood Pressure (Exercise) 162/90 170/96      Blood Pressure (Exit) 118/62 140/76      Heart Rate (Admit) 58 bpm 67 bpm      Heart Rate (Exercise) 107 bpm 105 bpm      Heart Rate (Exit) 67 bpm 66 bpm      Rating of Perceived Exertion (Exercise) 12 11      Symptoms No No      Duration Progress to 30 minutes of continuous aerobic without signs/symptoms of physical distress Progress to 30 minutes of continuous aerobic without signs/symptoms of physical distress      Intensity Rest + 30 Rest + 30  Progression   Progression Continue to progress workloads to maintain intensity without signs/symptoms of physical distress. Continue to progress workloads to maintain intensity without signs/symptoms of physical distress.        Resistance Training   Training Prescription Yes Yes      Weight 2.0 2.0      Reps 10-12 10-12        NuStep   Level 3 3      Watts 32 36      Minutes 15 15      METs 3.5 3.5        Arm Ergometer   Level 2.9 3      Watts 31 30      Minutes 20 20      METs 3.4 3.4        Home Exercise Plan   Plans to continue exercise at Home Home      Frequency Add 2 additional days to program exercise sessions. Add 2 additional days to program exercise sessions.         Exercise Comments:      Exercise Comments    Row Name 05/14/16 1610 05/31/16 1238          Exercise Comments Patient is progressing appropriately. Patient is progressing appropriately           Discharge Exercise Prescription (Final Exercise Prescription Changes):     Exercise Prescription Changes - 05/31/16 1200      Exercise Review   Progression Yes     Response to Exercise   Blood Pressure (Admit) 142/82   Blood Pressure (Exercise) 170/96   Blood Pressure (Exit) 140/76   Heart Rate (Admit) 67 bpm   Heart Rate (Exercise) 105 bpm   Heart Rate (Exit) 66 bpm   Rating of Perceived Exertion (Exercise) 11   Symptoms No   Duration Progress to 30 minutes of continuous aerobic without signs/symptoms of physical distress   Intensity Rest + 30     Progression   Progression Continue to progress workloads to maintain intensity without signs/symptoms of physical distress.     Resistance Training   Training Prescription Yes   Weight 2.0   Reps 10-12     NuStep   Level 3   Watts 36   Minutes 15   METs 3.5     Arm Ergometer   Level 3   Watts 30   Minutes 20   METs 3.4     Home Exercise Plan   Plans to continue exercise at Home   Frequency Add 2 additional days to program exercise sessions.      Nutrition:  Target Goals: Understanding of nutrition guidelines, daily intake of sodium 1500mg , cholesterol 200mg , calories 30% from fat and 7% or less from saturated fats, daily to have 5 or more servings of fruits and vegetables.  Biometrics:     Pre Biometrics - 02/23/16 1829      Pre Biometrics   Height 5\' 6"  (1.676 m)   Weight 163 lb 11.2 oz (74.3 kg)   Waist Circumference 36 inches   Hip Circumference 38 inches   Waist to Hip Ratio 0.95 %   BMI (Calculated) 26.5   Triceps Skinfold 5 mm   % Body Fat 21.8 %   Grip Strength 60.6 kg   Flexibility 0 in   Single Leg Stand 5 seconds         Post Biometrics - 06/03/16 1443       Post  Biometrics  Height 5\' 6"  (1.676 m)   Weight 164 lb 0.4 oz (74.4 kg)   Waist Circumference 36 inches   Hip  Circumference 39 inches   Waist to Hip Ratio 0.92 %   BMI (Calculated) 26.5   Triceps Skinfold 7 mm   % Body Fat 23.1 %   Grip Strength 57 kg   Flexibility 0 in   Single Leg Stand 0 seconds      Nutrition Therapy Plan and Nutrition Goals:   Nutrition Discharge: Rate Your Plate Scores:     Nutrition Assessments - 02/23/16 1516      MEDFICTS Scores   Pre Score 67      Nutrition Goals Re-Evaluation:   Psychosocial: Target Goals: Acknowledge presence or absence of depression, maximize coping skills, provide positive support system. Participant is able to verbalize types and ability to use techniques and skills needed for reducing stress and depression.  Initial Review & Psychosocial Screening:     Initial Psych Review & Screening - 02/23/16 1516      Family Dynamics   Good Support System? Yes     Barriers   Psychosocial barriers to participate in program There are no identifiable barriers or psychosocial needs.     Screening Interventions   Interventions Encouraged to exercise      Quality of Life Scores:     Quality of Life - 06/03/16 1444      Quality of Life Scores   Health/Function Pre 23.63 %   Health/Function Post 27.88 %   Health/Function % Change 17.99 %   Socioeconomic Pre 29.17 %   Socioeconomic Post 28.33 %   Socioeconomic % Change  -2.88 %   Psych/Spiritual Pre 28.93 %   Psych/Spiritual Post 26.86 %   Psych/Spiritual % Change -7.16 %   Family Pre 30 %   Family Post 29.38 %   Family % Change -2.07 %   GLOBAL Pre 27.03 %   GLOBAL Post 27.93 %   GLOBAL % Change 3.33 %      PHQ-9: Recent Review Flowsheet Data    Depression screen [facility] [address] 2/9 02/23/2016 02/13/2016 11/07/2015 11/01/2015 10/26/2015   Decreased Interest 0 0 0 0 0   Down, Depressed, Hopeless 0 0 0 0 0   PHQ - 2 Score 0 0 0 0 0   Altered sleeping 0 - - - -   Tired, decreased energy 0 - - - -   Change in appetite 1 - - - -   Feeling bad or failure about yourself  0 - - - -   Trouble  concentrating 1 - - - -   Moving slowly or fidgety/restless 0 - - - -   Suicidal thoughts 0 - - - -   PHQ-9 Score 2 - - - -   Difficult doing work/chores Not difficult at all - - - -      Psychosocial Evaluation and Intervention:     Psychosocial Evaluation - 02/23/16 1517      Psychosocial Evaluation & Interventions   Interventions Relaxation education;Stress management education;Encouraged to exercise with the program and follow exercise prescription   Continued Psychosocial Services Needed No      Psychosocial Re-Evaluation:     Psychosocial Re-Evaluation    Row Name 03/11/16 1423 04/15/16 1158 05/15/16 1419 06/06/16 1340       Psychosocial Re-Evaluation   Interventions Encouraged to attend Cardiac Rehabilitation for the exercise Relaxation education;Encouraged to attend Cardiac Rehabilitation for the exercise;Stress management education  -  -  Comments  -  - Patient does not need counseling. QOL score is 27.03.  Patient's exit QOL score was 27.93 which is a increase of 3.33% from when he started.     Continued Psychosocial Services Needed No No No No       Vocational Rehabilitation: Provide vocational rehab assistance to qualifying candidates.   Vocational Rehab Evaluation & Intervention:     Vocational Rehab - 02/23/16 1514      Initial Vocational Rehab Evaluation & Intervention   Assessment shows need for Vocational Rehabilitation No      Education: Education Goals: Education classes will be provided on a weekly basis, covering required topics. Participant will state understanding/return demonstration of topics presented.  Learning Barriers/Preferences:     Learning Barriers/Preferences - 02/23/16 1513      Learning Barriers/Preferences   Learning Barriers None   Learning Preferences Written Material      Education Topics: Hypertension, Hypertension Reduction -Define heart disease and high blood pressure. Discus how high blood pressure affects the  body and ways to reduce high blood pressure. Flowsheet Row CARDIAC REHAB PHASE II EXERCISE from 05/22/2016 in [place] CARDIAC REHABILITATION  Date  05/22/16  Educator  DC  Instruction Review Code  2- meets goals/outcomes      Exercise and Your Heart -Discuss why it is important to exercise, the FITT principles of exercise, normal and abnormal responses to exercise, and how to exercise safely.   Angina -Discuss definition of angina, causes of angina, treatment of angina, and how to decrease risk of having angina. Flowsheet Row CARDIAC REHAB PHASE II EXERCISE from 05/22/2016 in [place] CARDIAC REHABILITATION  Date  03/06/16  Educator  -- [D Williard Keller]  Instruction Review Code  2- meets goals/outcomes      Cardiac Medications -Review what the following cardiac medications are used for, how they affect the body, and side effects that may occur when taking the medications.  Medications include Aspirin, Beta blockers, calcium channel blockers, ACE Inhibitors, angiotensin receptor blockers, diuretics, digoxin, and antihyperlipidemics.   Congestive Heart Failure -Discuss the definition of CHF, how to live with CHF, the signs and symptoms of CHF, and how keep track of weight and sodium intake. Flowsheet Row CARDIAC REHAB PHASE II EXERCISE from 05/22/2016 in [place] CARDIAC REHABILITATION  Date  03/20/16  Educator  DC  Instruction Review Code  2- meets goals/outcomes      Heart Disease and Intimacy -Discus the effect sexual activity has on the heart, how changes occur during intimacy as we age, and safety during sexual activity. Flowsheet Row CARDIAC REHAB PHASE II EXERCISE from 05/22/2016 in [place] CARDIAC REHABILITATION  Date  03/27/16  Educator  DC  Instruction Review Code  2- meets goals/outcomes      Smoking Cessation / COPD -Discuss different methods to quit smoking, the health benefits of quitting smoking, and the definition of COPD. Flowsheet Row CARDIAC REHAB PHASE II  EXERCISE from 05/22/2016 in [place] CARDIAC REHABILITATION  Date  04/05/16  Educator  Hart Rochester  Instruction Review Code  2- meets goals/outcomes      Nutrition I: Fats -Discuss the types of cholesterol, what cholesterol does to the heart, and how cholesterol levels can be controlled.   Nutrition II: Labels -Discuss the different components of food labels and how to read food label Flowsheet Row CARDIAC REHAB PHASE II EXERCISE from 05/22/2016 in [place] CARDIAC REHABILITATION  Date  04/17/16  Educator  Hart Rochester  Instruction Review Code  2- meets goals/outcomes      Heart Parts and Heart Disease -Discuss the anatomy of the heart, the pathway of blood circulation through the heart, and these are affected by heart disease. Flowsheet Row CARDIAC REHAB PHASE II EXERCISE from 05/22/2016 in [place] CARDIAC REHABILITATION  Date  04/24/16  Educator  DC  Instruction Review Code  2- meets goals/outcomes      Stress I: Signs and Symptoms -Discuss the causes of stress, how stress may lead to anxiety and depression, and ways to limit stress. Flowsheet Row CARDIAC REHAB PHASE II EXERCISE from 05/22/2016 in [place] CARDIAC REHABILITATION  Date  05/01/16  Educator  DJ  Instruction Review Code  2- meets goals/outcomes      Stress II: Relaxation -Discuss different types of relaxation techniques to limit stress. Flowsheet Row CARDIAC REHAB PHASE II EXERCISE from 05/22/2016 in [place] CARDIAC REHABILITATION  Date  05/08/16  Educator  DC/DJ  Instruction Review Code  2- meets goals/outcomes      Warning Signs of Stroke / TIA -Discuss definition of a stroke, what the signs and symptoms are of a stroke, and how to identify when someone is having stroke. Flowsheet Row CARDIAC REHAB PHASE II EXERCISE from 05/22/2016 in [place] CARDIAC REHABILITATION  Date  05/15/16  Educator  DJ/DC  Instruction Review Code  2- meets goals/outcomes      Knowledge Questionnaire Score:      Knowledge Questionnaire Score - 02/23/16 1514      Knowledge Questionnaire Score   Pre Score 20/24      Core Components/Risk Factors/Patient Goals at Admission:     Personal Goals and Risk Factors at Admission - 02/23/16 1516      Core Components/Risk Factors/Patient Goals on Admission   Improve shortness of breath with ADL's Yes   Intervention Provide education, individualized exercise plan and daily activity instruction to help decrease symptoms of SOB with activities of daily living.   Expected Outcomes Short Term: Achieves a reduction of symptoms when performing activities of daily living.      Core Components/Risk Factors/Patient Goals Review:      Goals and Risk Factor Review    Row Name 02/23/16 1516 03/11/16 1422 04/15/16 1158 05/15/16 1416 06/06/16 1339     Core Components/Risk Factors/Patient Goals Review   Personal Goals Review Improve shortness of breath with ADL's Improve shortness of breath with ADL's Improve shortness of breath with ADL's Increase Strength and Stamina;Other Increase Strength and Stamina;Other   Review  - Patient is progressing well in the program.  No obvious SOB noted with exercise.  Patient is progressing well in the program.  No obvious SOB noted with exercise.  Patient is progressing well in the program. His energy has increased and he is breathing better.  Patient is progressing well in the program. His energy has increased and he is breathing better. Patient has also been able to increase his ADL's.   Expected Outcomes  - decrease SOB with ADL's decrease SOB with ADL's Continue to increase energy and to breathe getter.  Continue to exercise at home 3-5 days week. Patient graduated from program after 36 sessions on 06/03/16.       Core Components/Risk Factors/Patient Goals at Discharge (Final Review):      Goals and Risk Factor Review - 06/06/16 1339      Core Components/Risk Factors/Patient Goals Review   Personal Goals Review Increase  Strength and Stamina;Other   Review Patient is progressing well in the program. His  energy has increased and he is breathing better. Patient has also been able to increase his ADL's.   Expected Outcomes Continue to exercise at home 3-5 days week. Patient graduated from program after 36 sessions on 06/03/16.       ITP Comments:     ITP Comments    Row Name 02/23/16 1514 03/22/16 0900         ITP Comments Pt is a 82 year old male living with spouse and enjoys playing golf.  Patient does not exhibit any s/s of depression at this time.  Patient meet with the Dietician in our group heart healthy guidelines class at 9am. Group was shown how to create a heart healthy plate, how to make healthy choices while dining out, how to choose healthy fats , proteins, carbohydrates and the correct amount of sodium to use.          Comments: 30 day review. Patient did very well in the program. He graduated at 36 sessions on 06/03/16.

## 2016-06-07 ENCOUNTER — Encounter (HOSPITAL_COMMUNITY): Payer: Medicare Other

## 2016-06-07 NOTE — Progress Notes (Signed)
Discharge Summary  Patient Details  Name: Frederick Davis MRN: [id number] Date of Birth: 12/14/1922 Referring Provider:   Flowsheet Row CARDIAC REHAB PHASE II ORIENTATION from 02/23/2016 in [facility]  Referring Provider  Dr. Percival Spanish       Number of Visits: 36  Reason for Discharge:  Patient reached a stable level of exercise. Patient independent in their exercise.  Smoking History:  History  Smoking Status  . Former Smoker  . Packs/day: 0.50  . Years: 10.00  . Types: Cigarettes  . Start date: 10/21/1946  . Quit date: 10/21/1968  Smokeless Tobacco  . Never Used    Diagnosis:  S/P CABG x 3  ADL UCSD:   Initial Exercise Prescription:     Initial Exercise Prescription - 02/23/16 1800      Date of Initial Exercise RX and Referring Provider   Date 02/23/16   Referring Provider Dr. Percival Spanish     NuStep   Level 2   Watts 25   Minutes 15   METs 1.9     Arm Ergometer   Level 2.5   Watts 16   Minutes 15   METs 2.5     Prescription Details   Frequency (times per week) 3   Duration Progress to 30 minutes of continuous aerobic without signs/symptoms of physical distress     Intensity   THRR REST +  30   THRR 40-80% of Max Heartrate 85-100-114   Ratings of Perceived Exertion 11-13   Perceived Dyspnea 0-4     Progression   Progression Continue to progress workloads to maintain intensity without signs/symptoms of physical distress.     Resistance Training   Training Prescription Yes   Weight 1   Reps 10-12      Discharge Exercise Prescription (Final Exercise Prescription Changes):     Exercise Prescription Changes - 05/31/16 1200      Exercise Review   Progression Yes     Response to Exercise   Blood Pressure (Admit) 142/82   Blood Pressure (Exercise) 170/96   Blood Pressure (Exit) 140/76   Heart Rate (Admit) 67 bpm   Heart Rate (Exercise) 105 bpm   Heart Rate (Exit) 66 bpm   Rating of Perceived Exertion (Exercise) 11    Symptoms No   Duration Progress to 30 minutes of continuous aerobic without signs/symptoms of physical distress   Intensity Rest + 30     Progression   Progression Continue to progress workloads to maintain intensity without signs/symptoms of physical distress.     Resistance Training   Training Prescription Yes   Weight 2.0   Reps 10-12     NuStep   Level 3   Watts 36   Minutes 15   METs 3.5     Arm Ergometer   Level 3   Watts 30   Minutes 20   METs 3.4     Home Exercise Plan   Plans to continue exercise at Home   Frequency Add 2 additional days to program exercise sessions.      Functional Capacity:     6 Minute Walk    Row Name 02/23/16 1818 06/03/16 1442       6 Minute Walk   Phase Initial  -    Distance 900 feet 1000 feet    Distance % Change  - 11.11 %    Walk Time 6 minutes 6 minutes    # of Rest Breaks 0 0    MPH 1.7  1.89    METS 2.3 2.44    RPE 12 12    Perceived Dyspnea  13 11    VO2 Peak 5.59 5.6    Symptoms No No    Resting HR 56 bpm 64 bpm    Resting BP 112/80 150/78    Max Ex. HR 70 bpm 100 bpm    Max Ex. BP 148/90 178/90    2 Minute Post BP 138/86 150/78       Psychological, QOL, Others - Outcomes: PHQ 2/9: Depression screen [facility] 2/9 06/07/2016 02/23/2016 02/13/2016 11/07/2015 11/01/2015  Decreased Interest 0 0 0 0 0  Down, Depressed, Hopeless 0 0 0 0 0  PHQ - 2 Score 0 0 0 0 0  Altered sleeping 0 0 - - -  Tired, decreased energy 1 0 - - -  Change in appetite 0 1 - - -  Feeling bad or failure about yourself  0 0 - - -  Trouble concentrating 0 1 - - -  Moving slowly or fidgety/restless 0 0 - - -  Suicidal thoughts 0 0 - - -  PHQ-9 Score 1 2 - - -  Difficult doing work/chores Not difficult at all Not difficult at all - - -    Quality of Life:     Quality of Life - 06/03/16 1444      Quality of Life Scores   Health/Function Pre 23.63 %   Health/Function Post 27.88 %   Health/Function % Change 17.99 %   Socioeconomic Pre 29.17 %    Socioeconomic Post 28.33 %   Socioeconomic % Change  -2.88 %   Psych/Spiritual Pre 28.93 %   Psych/Spiritual Post 26.86 %   Psych/Spiritual % Change -7.16 %   Family Pre 30 %   Family Post 29.38 %   Family % Change -2.07 %   GLOBAL Pre 27.03 %   GLOBAL Post 27.93 %   GLOBAL % Change 3.33 %      Personal Goals: Goals established at orientation with interventions provided to work toward goal.     Personal Goals and Risk Factors at Admission - 02/23/16 1516      Core Components/Risk Factors/Patient Goals on Admission   Improve shortness of breath with ADL's Yes   Intervention Provide education, individualized exercise plan and daily activity instruction to help decrease symptoms of SOB with activities of daily living.   Expected Outcomes Short Term: Achieves a reduction of symptoms when performing activities of daily living.       Personal Goals Discharge:     Goals and Risk Factor Review    Row Name 02/23/16 1516 03/11/16 1422 04/15/16 1158 05/15/16 1416 06/03/16 1304     Core Components/Risk Factors/Patient Goals Review   Personal Goals Review Improve shortness of breath with ADL's Improve shortness of breath with ADL's Improve shortness of breath with ADL's Increase Strength and Stamina;Other Increase Strength and Stamina;Improve shortness of breath with ADL's   Review  - Patient is progressing well in the program.  No obvious SOB noted with exercise.  Patient is progressing well in the program.  No obvious SOB noted with exercise.  Patient is progressing well in the program. His energy has increased and he is breathing better.  Upon graduation, patient maintained his weight at 163. He did meet his goal to breathe better and he had increased strength and stamina. He had more energy and was able to do his ADL's with less SOB.    Expected Outcomes  -  decrease SOB with ADL's decrease SOB with ADL's Continue to increase energy and to breathe getter.  Patient will continue to meet  the above stated goals exercising at home and eating a heart healthy diet.    [place] Name 06/06/16 1339             Core Components/Risk Factors/Patient Goals Review   Personal Goals Review Increase Strength and Stamina;Other       Review Patient is progressing well in the program. His energy has increased and he is breathing better. Patient has also been able to increase his ADL's.       Expected Outcomes Continue to exercise at home 3-5 days week. Patient graduated from program after 36 sessions on 06/03/16.           Nutrition & Weight - Outcomes:     Pre Biometrics - 02/23/16 1829      Pre Biometrics   Height 5\' 6"  (1.676 m)   Weight 163 lb 11.2 oz (74.3 kg)   Waist Circumference 36 inches   Hip Circumference 38 inches   Waist to Hip Ratio 0.95 %   BMI (Calculated) 26.5   Triceps Skinfold 5 mm   % Body Fat 21.8 %   Grip Strength 60.6 kg   Flexibility 0 in   Single Leg Stand 5 seconds         Post Biometrics - 06/03/16 1443       Post  Biometrics   Height 5\' 6"  (1.676 m)   Weight 164 lb 0.4 oz (74.4 kg)   Waist Circumference 36 inches   Hip Circumference 39 inches   Waist to Hip Ratio 0.92 %   BMI (Calculated) 26.5   Triceps Skinfold 7 mm   % Body Fat 23.1 %   Grip Strength 57 kg   Flexibility 0 in   Single Leg Stand 0 seconds      Nutrition:   Nutrition Discharge:     Nutrition Assessments - 06/07/16 1303      MEDFICTS Scores   Pre Score 67   Post Score 27   Score Difference -40      Education Questionnaire Score:     Knowledge Questionnaire Score - 06/07/16 1302      Knowledge Questionnaire Score   Pre Score 20/24   Post Score 24/24      Goals reviewed with patient; copy given to patient.

## 2016-06-18 ENCOUNTER — Other Ambulatory Visit: Payer: Self-pay | Admitting: Family Medicine

## 2016-06-19 ENCOUNTER — Encounter: Payer: Self-pay | Admitting: Cardiology

## 2016-07-01 ENCOUNTER — Encounter: Payer: Self-pay | Admitting: Family Medicine

## 2016-07-01 ENCOUNTER — Ambulatory Visit (INDEPENDENT_AMBULATORY_CARE_PROVIDER_SITE_OTHER): Payer: Medicare Other | Admitting: Family Medicine

## 2016-07-01 VITALS — BP 130/79 | HR 62 | Temp 97.0°F | Ht 66.0 in | Wt 162.0 lb

## 2016-07-01 DIAGNOSIS — E785 Hyperlipidemia, unspecified: Secondary | ICD-10-CM

## 2016-07-01 DIAGNOSIS — I10 Essential (primary) hypertension: Secondary | ICD-10-CM

## 2016-07-01 DIAGNOSIS — D509 Iron deficiency anemia, unspecified: Secondary | ICD-10-CM

## 2016-07-01 DIAGNOSIS — I2510 Atherosclerotic heart disease of native coronary artery without angina pectoris: Secondary | ICD-10-CM

## 2016-07-01 DIAGNOSIS — E559 Vitamin D deficiency, unspecified: Secondary | ICD-10-CM

## 2016-07-01 DIAGNOSIS — C20 Malignant neoplasm of rectum: Secondary | ICD-10-CM

## 2016-07-01 DIAGNOSIS — E039 Hypothyroidism, unspecified: Secondary | ICD-10-CM | POA: Diagnosis not present

## 2016-07-01 DIAGNOSIS — N183 Chronic kidney disease, stage 3 unspecified: Secondary | ICD-10-CM

## 2016-07-01 DIAGNOSIS — C641 Malignant neoplasm of right kidney, except renal pelvis: Secondary | ICD-10-CM | POA: Diagnosis not present

## 2016-07-01 DIAGNOSIS — M5136 Other intervertebral disc degeneration, lumbar region: Secondary | ICD-10-CM | POA: Diagnosis not present

## 2016-07-01 DIAGNOSIS — C61 Malignant neoplasm of prostate: Secondary | ICD-10-CM

## 2016-07-01 DIAGNOSIS — R6889 Other general symptoms and signs: Secondary | ICD-10-CM | POA: Diagnosis not present

## 2016-07-01 DIAGNOSIS — H6122 Impacted cerumen, left ear: Secondary | ICD-10-CM | POA: Diagnosis not present

## 2016-07-01 MED ORDER — CARVEDILOL 3.125 MG PO TABS: 3.1250 mg | ORAL_TABLET | Freq: Two times a day (BID) | ORAL | 11 refills | Status: DC

## 2016-07-01 NOTE — Progress Notes (Signed)
Subjective:    Patient ID: Frederick Davis, male    DOB: 1923/01/25, 82 y.o.   MRN: [id number]  HPI Pt here for follow up and management of chronic medical problems which includes hyperlipidemia and hypertension. He is taking medications regularly.The patient is doing well overall. The biggest issue for him is having had his bypass surgery and he was slow to recover from this but as mentioned he is followed by the cardiologist on a regular basis and seems to be improving. He sees the cardiologist this week. He is requesting a refill on his Coreg. His body mass index is 26. He will continue to follow a heart healthy diet. The patient is concerned today about his memory and his recall and that is not as good as it used to be. She also gives me a history of blood pressure readings from home which is been running in the 120s to 140s over the 70-80 range at home. He has a copy of these readings and will take them with him to see the cardiologist at his visit which is coming up soon. He denies any chest pain pressure or tightness. He denies any trouble with swallowing heartburn indigestion nausea vomiting diarrhea or blood in the stool. He says he's passing his water without problems. He says he has not been as active physically with just simple walking as he would like to have been. He has not played any cough and is somewhat reluctant to do this more from his wife's side than his own not feeling good with this. I told him to discuss this with the cardiologist at his upcoming visit. He says the most limiting thing is that he has some left knee pain. His back pain is under pretty good control.    Patient Active Problem List   Diagnosis Date Noted  . S/P CABG x 3 12/08/2015  . Unstable angina ([place])   . Chest pain with moderate risk of acute coronary syndrome 12/05/2015  . Aortic stenosis, mild-2014 12/05/2015  . Anginal pain ([place]) 10/17/2015  . History of renal cell cancer-s/p nephrectomy 07/19/2013  .  Hyperlipidemia 07/19/2013  . Hypotension arterial 03/18/2013  . Complete heart block ([place]) 03/18/2013  . Anemia   . CKD (chronic kidney disease) stage 3, GFR 30-59 ml/min 03/12/2013  . Acute MI, inferior wall, initial episode of care ([place]) 03/12/2013  . Impotence 03/12/2013  . History of renal stone 03/12/2013  . Hiatal hernia 03/12/2013  . H/O lithotripsy 03/12/2013  . SCCA (squamous cell carcinoma) of skin 03/12/2013  . S/P prostatectomy, radical 03/12/2013  . Left renal mass 03/12/2013  . Arthritis 03/12/2013  . History of STEMI- May 2014 03/12/2013  . CAD S/P RCA thrombectomy May 2014- residual LAD disease then 03/12/2013  . Cardiogenic shock ([place]) 03/12/2013  . Acute respiratory failure ([place]) 03/12/2013  . Altered mental status 03/12/2013  . Essential hypertension, benign 01/14/2013  . Colostomy in place [facility]) 01/14/2013  . Prostate cancer ([place]) 01/14/2013  . Renal cell carcinoma, Right 01/14/2013  . Osteoporosis, unspecified 01/14/2013   Outpatient Encounter Prescriptions as of 07/01/2016  Medication Sig  . acetaminophen (TYLENOL) 500 MG tablet Take 500 mg by mouth at bedtime.  Marland Kitchen amLODipine (NORVASC) 2.5 MG tablet Take 1 tablet (2.5 mg total) by mouth daily.  Marland Kitchen aspirin 81 MG tablet Take 1 tablet (81 mg total) by mouth daily.  . carvedilol (COREG) 3.125 MG tablet Take 1 tablet (3.125 mg total) by mouth 2 (two) times daily with a  meal.  . furosemide (LASIX) 20 MG tablet Take 1 tablet daily as needed.  Marland Kitchen levothyroxine (SYNTHROID, LEVOTHROID) 75 MCG tablet Take 75 mcg by mouth daily before breakfast.  . Misc Natural Products (OSTEO BI-FLEX/5-LOXIN ADVANCED PO) Take 1 tablet by mouth daily.  . Multiple Vitamin (MULTIVITAMIN) tablet Take 1 tablet by mouth daily.  . nitroGLYCERIN (NITROSTAT) 0.4 MG SL tablet PLACE 1 TABLET UNDER THE TONGUE AT ONSET OF CHEST PAIN EVERY 5 MINTUES UP TO 3 TIMES AS NEEDED  . [DISCONTINUED] carvedilol (COREG) 3.125 MG tablet Take 1 tablet (3.125 mg  total) by mouth 2 (two) times daily with a meal.   Facility-Administered Encounter Medications as of 07/01/2016  Medication  . etomidate (AMIDATE) injection  . lidocaine (cardiac) 100 mg/44m (XYLOCAINE) 20 MG/ML injection 2%  . succinylcholine (ANECTINE) injection     Review of Systems  Constitutional: Negative.   HENT: Negative.   Eyes: Negative.   Respiratory: Negative.   Cardiovascular: Negative.   Gastrointestinal: Negative.   Endocrine: Negative.   Genitourinary: Negative.   Musculoskeletal: Negative.   Skin: Negative.   Allergic/Immunologic: Negative.   Neurological: Negative.   Hematological: Negative.   Psychiatric/Behavioral: Negative.        Objective:   Physical Exam  Constitutional: He is oriented to person, place, and time. He appears well-developed and well-nourished. No distress.  The patient is doing well for his age of 89years.  HENT:  Head: Normocephalic and atraumatic.  Right Ear: External ear normal.  Nose: Nose normal.  Mouth/Throat: Oropharynx is clear and moist. No oropharyngeal exudate.  There is cerumen in the left ear canal.  Eyes: Conjunctivae and EOM are normal. Pupils are equal, round, and reactive to light. Right eye exhibits no discharge. Left eye exhibits no discharge. No scleral icterus.  Neck: Normal range of motion. Neck supple. No thyromegaly present.  There is a faint left carotid bruit.  Cardiovascular: Normal rate and regular rhythm.   Murmur heard. Heart has a regular rate and rhythm at 72/m with a grade 3/6 systolic ejection murmur  Pulmonary/Chest: Effort normal and breath sounds normal. No respiratory distress. He has no wheezes. He has no rales. He exhibits no tenderness.  Lungs are clear anteriorly and posteriorly no axillary adenopathy  Abdominal: Soft. Bowel sounds are normal. He exhibits no mass. There is no tenderness. There is no rebound and no guarding.  The patient has a colostomy bag in [place]. There appears to be no  organ enlargement or abnormal masses present.  Genitourinary:  Genitourinary Comments: He sees the urologist regularly. This is on a yearly basis.  Musculoskeletal: Normal range of motion. He exhibits edema.  There is some increased edema in the left leg compared to the right. Some stiffness and swelling in the left knee.  Lymphadenopathy:    He has no cervical adenopathy.  Neurological: He is alert and oriented to person, place, and time.  Skin: Skin is warm and dry. No rash noted.  Psychiatric: He has a normal mood and affect. His behavior is normal. Judgment and thought content normal.  Nursing note and vitals reviewed.  BP 130/79 (BP Location: Left Arm)   Pulse 62   Temp 97 F (36.1 C) (Oral)   Ht _0  (1.676 m)   Wt 162 lb (73.5 kg)   BMI 26.15 kg/m         Assessment & Plan:  1. ASCVD (arteriosclerotic cardiovascular disease) -Continue to follow-up with cardiology - CBC with Differential/Platelet - NMR, lipoprofile  2. CKD (chronic kidney disease) stage 3, GFR 30-59 ml/min -Continue to avoid NSAIDs and make all efforts to keep blood pressure under good control - BMP8+EGFR - CBC with Differential/Platelet  3. Prostate cancer [facility]) -Continue follow-up with yearly urology visit - CBC with Differential/Platelet  4. Hyperlipidemia -Continue aggressive therapeutic lifestyle changes - CBC with Differential/Platelet - NMR, lipoprofile  5. Vitamin D deficiency -Continue current treatment pending results of lab work - CBC with Differential/Platelet - VITAMIN D 25 Hydroxy (Vit-D Deficiency, Fractures)  6. Iron deficiency anemia - CBC with Differential/Platelet - Vitamin B12  7. Essential hypertension, benign -Office blood pressure readings were good today a repeat by me in the right arm was 142/88 the initial reading was 130/79 and the patient did not take his medication this morning. No change in treatment. - BMP8+EGFR - CBC with Differential/Platelet - Hepatic  function panel  8. Renal cell carcinoma, right (HCC) -Continue to follow up with urology  9. Degenerative disc disease, lumbar -The patient is doing well with this currently  10. Rectal cancer ([place]) -Continue to follow-up with gastroenterology  11. Left ear impacted cerumen -Use Debrox as directed  12. BMI -Continue with current diet habits with a BMI at 26.  Meds ordered this encounter  Medications  . carvedilol (COREG) 3.125 MG tablet    Sig: Take 1 tablet (3.125 mg total) by mouth 2 (two) times daily with a meal.    Dispense:  60 tablet    Refill:  11   Patient Instructions                       Medicare Annual Wellness Visit  [place] and the medical providers at [facility] strive to bring you the best medical care.  In doing so we not only want to address your current medical conditions and concerns but also to detect new conditions early and prevent illness, disease and health-related problems.    Medicare offers a yearly Wellness Visit which allows our clinical staff to assess your need for preventative services including immunizations, lifestyle education, counseling to decrease risk of preventable diseases and screening for fall risk and other medical concerns.    This visit is provided free of charge (no copay) for all Medicare recipients. The clinical pharmacists at [facility] have begun to conduct these Wellness Visits which will also include a thorough review of all your medications.    As you primary medical provider recommend that you make an appointment for your Annual Wellness Visit if you have not done so already this year.  You may set up this appointment before you leave today or you may call back [phone]) and schedule an appointment.  Please make sure when you call that you mention that you are scheduling your Annual Wellness Visit with the clinical pharmacist so that the appointment may be made for the proper  length of time.     Continue current medications. Continue good therapeutic lifestyle changes which include good diet and exercise. Fall precautions discussed with patient. If an FOBT was given today- please return it to our front desk. If you are over 31 years old - you may need Prevnar 27 or the adult Pneumonia vaccine.  **Flu shots are available--- please call and schedule a FLU-CLINIC appointment**  After your visit with [place] today you will receive a survey in the mail or online from [employer] regarding your care with [place]. Please take a moment to  fill this out. Your feedback is very important to [place] as you can help [place] better understand your patient needs as well as improve your experience and satisfaction. WE CARE ABOUT YOU!!!   Use Debrox drops 3-4 drops to the left ear canal for 3 nights in a row wait 1 week and repeat this procedure. Call the office and schedule for an ear irrigation of the left ear canal. The Debrox will soften the wax to help more amenable to irrigation. Take blood pressure readings with you to the visit with the cardiologist for him to review and bring [place] a copy of possible Continue to monitor blood pressures at home Discussed with the cardiologist regarding your playing golf. Do not forget to get your flu shot    Arrie Senate MD

## 2016-07-01 NOTE — Patient Instructions (Addendum)
Medicare Annual Wellness Visit  [place] [facility] and the medical providers at [facility] strive to bring you the best medical care.  In doing so we not only want to address your current medical conditions and concerns but also to detect new conditions early and prevent illness, disease and health-related problems.    Medicare offers a yearly Wellness Visit which allows our clinical staff to assess your need for preventative services including immunizations, lifestyle education, counseling to decrease risk of preventable diseases and screening for fall risk and other medical concerns.    This visit is provided free of charge (no copay) for all Medicare recipients. The clinical pharmacists at [facility] have begun to conduct these Wellness Visits which will also include a thorough review of all your medications.    As you primary medical provider recommend that you make an appointment for your Annual Wellness Visit if you have not done so already this year.  You may set up this appointment before you leave today or you may call back [phone] and schedule an appointment.  Please make sure when you call that you mention that you are scheduling your Annual Wellness Visit with the clinical pharmacist so that the appointment may be made for the proper length of time.     Continue current medications. Continue good therapeutic lifestyle changes which include good diet and exercise. Fall precautions discussed with patient. If an FOBT was given today- please return it to our front desk. If you are over 26 years old - you may need Prevnar 12 or the adult Pneumonia vaccine.  **Flu shots are available--- please call and schedule a FLU-CLINIC appointment**  After your visit with [place] today you will receive a survey in the mail or online from [employer] regarding your care with [place]. Please take a moment to fill this out. Your feedback is very  important to [place] as you can help [place] better understand your patient needs as well as improve your experience and satisfaction. WE CARE ABOUT YOU!!!   Use Debrox drops 3-4 drops to the left ear canal for 3 nights in a row wait 1 week and repeat this procedure. Call the office and schedule for an ear irrigation of the left ear canal. The Debrox will soften the wax to help more amenable to irrigation. Take blood pressure readings with you to the visit with the cardiologist for him to review and bring [place] a copy of possible Continue to monitor blood pressures at home Discussed with the cardiologist regarding your playing golf. Do not forget to get your flu shot

## 2016-07-02 LAB — CBC WITH DIFFERENTIAL/PLATELET
BASOS: 1 %
Basophils Absolute: 0.1 10*3/uL (ref 0.0–0.2)
EOS (ABSOLUTE): 0.4 10*3/uL (ref 0.0–0.4)
Eos: 6 %
HEMATOCRIT: 37.8 % (ref 37.5–51.0)
Hemoglobin: 12.4 g/dL — ABNORMAL LOW (ref 12.6–17.7)
IMMATURE GRANS (ABS): 0 10*3/uL (ref 0.0–0.1)
Immature Granulocytes: 0 %
LYMPHS: 21 %
Lymphocytes Absolute: 1.5 10*3/uL (ref 0.7–3.1)
MCH: 27.9 pg (ref 26.6–33.0)
MCHC: 32.8 g/dL (ref 31.5–35.7)
MCV: 85 fL (ref 79–97)
Monocytes Absolute: 0.6 10*3/uL (ref 0.1–0.9)
Monocytes: 9 %
NEUTROS ABS: 4.4 10*3/uL (ref 1.4–7.0)
Neutrophils: 63 %
PLATELETS: 221 10*3/uL (ref 150–379)
RBC: 4.45 x10E6/uL (ref 4.14–5.80)
RDW: 15.4 % (ref 12.3–15.4)
WBC: 7 10*3/uL (ref 3.4–10.8)

## 2016-07-02 LAB — NMR, LIPOPROFILE
Cholesterol: 151 mg/dL (ref 100–199)
HDL CHOLESTEROL BY NMR: 71 mg/dL (ref >39)
HDL Particle Number: 44 umol/L (ref >=30.5)
LDL PARTICLE NUMBER: 820 nmol/L (ref <1000)
LDL Size: 21 nm (ref >20.5)
LDL-C: 63 mg/dL (ref 0–99)
LP-IR Score: <25 (ref <=45)
SMALL LDL PARTICLE NUMBER: 242 nmol/L (ref <=527)
TRIGLYCERIDES BY NMR: 84 mg/dL (ref 0–149)

## 2016-07-02 LAB — HEPATIC FUNCTION PANEL
ALK PHOS: 103 IU/L (ref 39–117)
ALT: 11 IU/L (ref 0–44)
AST: 20 IU/L (ref 0–40)
Albumin: 4 g/dL (ref 3.2–4.6)
BILIRUBIN, DIRECT: 0.15 mg/dL (ref 0.00–0.40)
Bilirubin Total: 0.4 mg/dL (ref 0.0–1.2)
Total Protein: 6.3 g/dL (ref 6.0–8.5)

## 2016-07-02 LAB — BMP8+EGFR
BUN/Creatinine Ratio: 20 (ref 10–24)
BUN: 31 mg/dL (ref 10–36)
CO2: 23 mmol/L (ref 18–29)
Calcium: 10.1 mg/dL (ref 8.6–10.2)
Chloride: 106 mmol/L (ref 96–106)
Creatinine, Ser: 1.52 mg/dL — ABNORMAL HIGH (ref 0.76–1.27)
GFR, EST AFRICAN AMERICAN: 46 mL/min/{1.73_m2} — AB (ref >59)
GFR, EST NON AFRICAN AMERICAN: 39 mL/min/{1.73_m2} — AB (ref >59)
Glucose: 80 mg/dL (ref 65–99)
POTASSIUM: 4.9 mmol/L (ref 3.5–5.2)
SODIUM: 143 mmol/L (ref 134–144)

## 2016-07-02 LAB — VITAMIN D 25 HYDROXY (VIT D DEFICIENCY, FRACTURES): VIT D 25 HYDROXY: 33 ng/mL (ref 30.0–100.0)

## 2016-07-02 LAB — VITAMIN B12: VITAMIN B 12: 449 pg/mL (ref 211–946)

## 2016-07-03 ENCOUNTER — Other Ambulatory Visit: Payer: Self-pay | Admitting: Family Medicine

## 2016-07-03 NOTE — Progress Notes (Signed)
HPI The patient presents for followup after a acute inferior wall infarct in May of 2014. He had thrombectomy of a right coronary artery occlusion. He was initially acute respiratory failure and was cooled. However, he responded very well to therapy.   Earlier this year the patient was having chest pain.  He was sent for cardiac catheterization. This demonstrated severe LAD stenosis and RCA stenosis. He subsequently underwent a CABG 3 on February 17.  At the last visit I started him on Norvasc for better blood pressure control.  He did well with this.  The patient denies any new symptoms such as chest discomfort, neck or arm discomfort. There has been no new shortness of breath, PND or orthopnea. There have been no reported palpitations, presyncope or syncope.  I did review his blood pressure diary and the readings were excellent.  Allergies  Allergen Reactions  . Lipitor [Atorvastatin] Other (See Comments)    Muscle aches   Prior to Admission medications   Medication Sig Start Date End Date Taking? Authorizing Provider  acetaminophen (TYLENOL) 500 MG tablet Take 500 mg by mouth at bedtime.   Yes Historical Provider, MD  amLODipine (NORVASC) 2.5 MG tablet Take 1 tablet (2.5 mg total) by mouth daily. 05/30/16 08/28/16 Yes Minus Breeding, MD  aspirin 81 MG tablet Take 1 tablet (81 mg total) by mouth daily. 03/18/13  Yes Rhonda G Barrett, PA-C  carvedilol (COREG) 3.125 MG tablet Take 1 tablet (3.125 mg total) by mouth 2 (two) times daily with a meal. 07/01/16  Yes Chipper Herb, MD  furosemide (LASIX) 20 MG tablet Take 1 tablet daily as needed. 04/03/16  Yes Minus Breeding, MD  levothyroxine (SYNTHROID, LEVOTHROID) 75 MCG tablet TAKE 1 TABLET DAILY 07/03/16  Yes Chipper Herb, MD  Misc Natural Products (OSTEO BI-FLEX/5-LOXIN ADVANCED PO) Take 1 tablet by mouth daily.   Yes Historical Provider, MD  Multiple Vitamin (MULTIVITAMIN) tablet Take 1 tablet by mouth daily.   Yes Historical Provider, MD    nitroGLYCERIN (NITROSTAT) 0.4 MG SL tablet PLACE 1 TABLET UNDER THE TONGUE AT ONSET OF CHEST PAIN EVERY 5 MINTUES UP TO 3 TIMES AS NEEDED 03/12/16  Yes Chipper Herb, MD    Past Medical History:  Diagnosis Date  . Anemia   . CAD (coronary artery disease)    RCA throbectomy 2014  . Chronic renal insufficiency, stage III (moderate)   . Esophageal stricture   . Hypertension   . Hypothyroidism   . Myocardial infarction ([place]) 03/12/13   Inferior MI.  LAD mid 80% stenosis, circ mild plaque, RCA 100% with thrombectomy PTCA.  EF 65%  . Osteoporosis   . PC (prostate cancer) ([place]) 1987  . Rectum cancer ([place]) 1986  . Renal cell cancer [facility])     Past Surgical History:  Procedure Laterality Date  . CARDIAC CATHETERIZATION N/A 12/06/2015   Procedure: Left Heart Cath and Coronary Angiography;  Surgeon: Burnell Blanks, MD;  Location: [place] CV LAB;  Service: Cardiovascular;  Laterality: N/A;  . COLONOSCOPY  01/08/2012   Procedure: COLONOSCOPY;  Surgeon: Rogene Houston, MD;  [facility];  Service: Endoscopy;  Laterality: N/A;  1030  . COLONOSCOPY N/A 07/20/2015   Procedure: COLONOSCOPY;  Surgeon: Rogene Houston, MD;  [facility];  Service: Endoscopy;  Laterality: N/A;  930 - moved to 9/29 @ 2:00  . COLOSTOMY Left 1986   colon cancer  . CORONARY ARTERY BYPASS GRAFT N/A 12/08/2015   Procedure:  CORONARY ARTERY BYPASS GRAFTING (CABG);  Surgeon: Melrose Nakayama, MD;  Location: [place];  Service: Open Heart Surgery;  Laterality: N/A;  Times 3 using left internal mammary artery and endoscopycally harvested bilateral saphenous vein.  Marland Kitchen HERNIA REPAIR     2007  . KIDNEY SURGERY  2004  . LEFT HEART CATHETERIZATION WITH CORONARY ANGIOGRAM N/A 03/12/2013   Procedure: STEMI;  Surgeon: Burnell Blanks, MD;  Location: [facility] CATH LAB;  Service: Cardiovascular;  Laterality: N/A;  . PERCUTANEOUS CORONARY INTERVENTION-BALLOON ONLY    . PROSTATECTOMY    . [place]  2006  . renal sphincter appliance  2007  . Right inguinal hernia repair    . TEE WITHOUT CARDIOVERSION N/A 12/08/2015   Procedure: TRANSESOPHAGEAL ECHOCARDIOGRAM (TEE);  Surgeon: Melrose Nakayama, MD;  Location: [facility];  Service: Open Heart Surgery;  Laterality: N/A;    ROS:  As stated in the HPI and negative for all other systems.  PHYSICAL EXAM BP (!) 148/96   Pulse 71   Ht 5' 4.5" (1.638 m)   Wt 158 lb 12.8 oz (72 kg)   BMI 26.84 kg/m  GENERAL:  Slightly frail NECK:  No jugular venous distention, waveform within normal limits, carotid upstroke brisk and symmetric, no bruits, no thyromegaly CHEST: Healed sternotomy scar. LUNGS:  Clear to auscultation bilaterally BACK:  No CVA tenderness, mild lordosis CHEST:  Well healed sternotomy scar. HEART:  PMI not displaced or sustained,S1 and S2 within normal limits, no S3, no S4, no clicks, no rubs, 3/6 apical systolic murmur radiating out the outflow tract, no diastolic murmurs ABD:  Flat, positive bowel sounds normal in frequency in pitch, no bruits, no rebound, no guarding, no midline pulsatile mass, no hepatomegaly, no splenomegaly, , colostomy bag in place EXT:  2 plus pulses throughout, mild/mod bilateral ankle edema, no cyanosis no clubbing   ASSESSMENT AND PLAN  CAD/CABG:  He continues to recover.   No change in therapy is planned.    HTN:  The blood pressure is at target and he will continue the meds as listed.   DYSLIPIDEMIA:  He has been intolerant of 1 statin in the past. I will consider trying a different one in the future.   However, given the current complaints I will not start this at this time.   AORTIC STENOSIS:  This is moderate and he will be followed clinically.    EDEMA:  This is stable.  There will be no change in therapy.

## 2016-07-04 ENCOUNTER — Encounter: Payer: Self-pay | Admitting: Cardiology

## 2016-07-04 ENCOUNTER — Ambulatory Visit (INDEPENDENT_AMBULATORY_CARE_PROVIDER_SITE_OTHER): Payer: Medicare Other | Admitting: Cardiology

## 2016-07-04 VITALS — BP 148/96 | HR 71 | Ht 64.5 in | Wt 158.8 lb

## 2016-07-04 DIAGNOSIS — I2510 Atherosclerotic heart disease of native coronary artery without angina pectoris: Secondary | ICD-10-CM

## 2016-07-04 DIAGNOSIS — I10 Essential (primary) hypertension: Secondary | ICD-10-CM

## 2016-07-04 LAB — THYROID PANEL WITH TSH
FREE THYROXINE INDEX: 2.3 (ref 1.2–4.9)
T3 Uptake Ratio: 28 % (ref 24–39)
T4, Total: 8.2 ug/dL (ref 4.5–12.0)
TSH: 1.39 u[IU]/mL (ref 0.450–4.500)

## 2016-07-04 LAB — SPECIMEN STATUS REPORT

## 2016-07-04 NOTE — Patient Instructions (Signed)
Medication Instructions:  Continue current medications  Labwork: None Ordered  Testing/Procedures: None Ordered  Follow-Up: Your physician recommends that you schedule a follow-up appointment in: 4 Months in [place]   Any Other Special Instructions Will Be Listed Below (If Applicable).   If you need a refill on your cardiac medications before your next appointment, please call your pharmacy.

## 2016-07-11 DIAGNOSIS — M1712 Unilateral primary osteoarthritis, left knee: Secondary | ICD-10-CM | POA: Diagnosis not present

## 2016-07-17 DIAGNOSIS — M1712 Unilateral primary osteoarthritis, left knee: Secondary | ICD-10-CM | POA: Diagnosis not present

## 2016-07-24 DIAGNOSIS — M1712 Unilateral primary osteoarthritis, left knee: Secondary | ICD-10-CM | POA: Diagnosis not present

## 2016-07-30 ENCOUNTER — Ambulatory Visit (INDEPENDENT_AMBULATORY_CARE_PROVIDER_SITE_OTHER): Payer: Medicare Other

## 2016-07-30 DIAGNOSIS — Z85828 Personal history of other malignant neoplasm of skin: Secondary | ICD-10-CM | POA: Diagnosis not present

## 2016-07-30 DIAGNOSIS — D485 Neoplasm of uncertain behavior of skin: Secondary | ICD-10-CM | POA: Diagnosis not present

## 2016-07-30 DIAGNOSIS — C44622 Squamous cell carcinoma of skin of right upper limb, including shoulder: Secondary | ICD-10-CM | POA: Diagnosis not present

## 2016-07-30 DIAGNOSIS — Z23 Encounter for immunization: Secondary | ICD-10-CM

## 2016-07-30 DIAGNOSIS — L570 Actinic keratosis: Secondary | ICD-10-CM | POA: Diagnosis not present

## 2016-07-30 DIAGNOSIS — D225 Melanocytic nevi of trunk: Secondary | ICD-10-CM | POA: Diagnosis not present

## 2016-08-01 DIAGNOSIS — Z85828 Personal history of other malignant neoplasm of skin: Secondary | ICD-10-CM | POA: Diagnosis not present

## 2016-08-01 DIAGNOSIS — D485 Neoplasm of uncertain behavior of skin: Secondary | ICD-10-CM | POA: Diagnosis not present

## 2016-08-01 DIAGNOSIS — L570 Actinic keratosis: Secondary | ICD-10-CM | POA: Diagnosis not present

## 2016-08-01 DIAGNOSIS — D0461 Carcinoma in situ of skin of right upper limb, including shoulder: Secondary | ICD-10-CM | POA: Diagnosis not present

## 2016-10-18 ENCOUNTER — Ambulatory Visit (INDEPENDENT_AMBULATORY_CARE_PROVIDER_SITE_OTHER): Payer: Medicare Other | Admitting: *Deleted

## 2016-10-18 ENCOUNTER — Telehealth: Payer: Self-pay | Admitting: *Deleted

## 2016-10-18 ENCOUNTER — Ambulatory Visit (INDEPENDENT_AMBULATORY_CARE_PROVIDER_SITE_OTHER): Payer: Medicare Other

## 2016-10-18 DIAGNOSIS — R05 Cough: Secondary | ICD-10-CM | POA: Diagnosis not present

## 2016-10-18 DIAGNOSIS — R059 Cough, unspecified: Secondary | ICD-10-CM

## 2016-10-18 MED ORDER — AZITHROMYCIN 250 MG PO TABS: ORAL_TABLET | ORAL | 0 refills | Status: DC

## 2016-10-18 NOTE — Telephone Encounter (Signed)
Please call in a Z-Pak for this patient and have him take plain Mucinex maximum strength 1 twice daily with a large glass of water and do not use any overhead fans

## 2016-10-18 NOTE — Telephone Encounter (Signed)
Pt has a nurse visit note, please see from 12/29-kc

## 2016-10-18 NOTE — Progress Notes (Signed)
Spoke with DWM and advised of symptoms and DWM said to call in [place] and pt is aware that [facility] will call with any results from CBC and chest x-ray.

## 2016-10-19 LAB — CBC WITH DIFFERENTIAL/PLATELET
BASOS ABS: 0 10*3/uL (ref 0.0–0.2)
Basos: 1 %
EOS (ABSOLUTE): 0.2 10*3/uL (ref 0.0–0.4)
EOS: 3 %
HEMATOCRIT: 37.3 % — AB (ref 37.5–51.0)
HEMOGLOBIN: 11.9 g/dL — AB (ref 13.0–17.7)
IMMATURE GRANS (ABS): 0 10*3/uL (ref 0.0–0.1)
IMMATURE GRANULOCYTES: 1 %
LYMPHS ABS: 2.1 10*3/uL (ref 0.7–3.1)
LYMPHS: 32 %
MCH: 26.6 pg (ref 26.6–33.0)
MCHC: 31.9 g/dL (ref 31.5–35.7)
MCV: 83 fL (ref 79–97)
MONOCYTES: 16 %
Monocytes Absolute: 1 10*3/uL — ABNORMAL HIGH (ref 0.1–0.9)
NEUTROS PCT: 47 %
Neutrophils Absolute: 3.1 10*3/uL (ref 1.4–7.0)
Platelets: 228 10*3/uL (ref 150–379)
RBC: 4.48 x10E6/uL (ref 4.14–5.80)
RDW: 15 % (ref 12.3–15.4)
WBC: 6.4 10*3/uL (ref 3.4–10.8)

## 2016-10-29 ENCOUNTER — Other Ambulatory Visit: Payer: Medicare Other

## 2016-10-29 DIAGNOSIS — E785 Hyperlipidemia, unspecified: Secondary | ICD-10-CM | POA: Diagnosis not present

## 2016-10-29 DIAGNOSIS — I10 Essential (primary) hypertension: Secondary | ICD-10-CM

## 2016-10-29 DIAGNOSIS — M810 Age-related osteoporosis without current pathological fracture: Secondary | ICD-10-CM | POA: Diagnosis not present

## 2016-10-30 LAB — CBC WITH DIFFERENTIAL/PLATELET
BASOS: 0 %
Basophils Absolute: 0 10*3/uL (ref 0.0–0.2)
EOS (ABSOLUTE): 0.2 10*3/uL (ref 0.0–0.4)
EOS: 4 %
Hematocrit: 37.2 % — ABNORMAL LOW (ref 37.5–51.0)
Hemoglobin: 11.8 g/dL — ABNORMAL LOW (ref 13.0–17.7)
IMMATURE GRANS (ABS): 0 10*3/uL (ref 0.0–0.1)
IMMATURE GRANULOCYTES: 0 %
LYMPHS: 22 %
Lymphocytes Absolute: 1.5 10*3/uL (ref 0.7–3.1)
MCH: 27.3 pg (ref 26.6–33.0)
MCHC: 31.7 g/dL (ref 31.5–35.7)
MCV: 86 fL (ref 79–97)
MONOS ABS: 0.5 10*3/uL (ref 0.1–0.9)
Monocytes: 8 %
NEUTROS PCT: 66 %
Neutrophils Absolute: 4.3 10*3/uL (ref 1.4–7.0)
PLATELETS: 302 10*3/uL (ref 150–379)
RBC: 4.33 x10E6/uL (ref 4.14–5.80)
RDW: 14.4 % (ref 12.3–15.4)
WBC: 6.5 10*3/uL (ref 3.4–10.8)

## 2016-10-30 LAB — BMP8+EGFR
BUN / CREAT RATIO: 16 (ref 10–24)
BUN: 25 mg/dL (ref 10–36)
CHLORIDE: 103 mmol/L (ref 96–106)
CO2: 24 mmol/L (ref 18–29)
Calcium: 9.8 mg/dL (ref 8.6–10.2)
Creatinine, Ser: 1.52 mg/dL — ABNORMAL HIGH (ref 0.76–1.27)
GFR calc non Af Amer: 39 mL/min/{1.73_m2} — ABNORMAL LOW (ref >59)
GFR, EST AFRICAN AMERICAN: 45 mL/min/{1.73_m2} — AB (ref >59)
Glucose: 88 mg/dL (ref 65–99)
POTASSIUM: 5.1 mmol/L (ref 3.5–5.2)
SODIUM: 139 mmol/L (ref 134–144)

## 2016-10-30 LAB — VITAMIN D 25 HYDROXY (VIT D DEFICIENCY, FRACTURES): Vit D, 25-Hydroxy: 35.6 ng/mL (ref 30.0–100.0)

## 2016-10-30 LAB — NMR, LIPOPROFILE
CHOLESTEROL: 133 mg/dL (ref 100–199)
HDL Cholesterol by NMR: 54 mg/dL (ref >39)
HDL Particle Number: 35.4 umol/L (ref >=30.5)
LDL Particle Number: 717 nmol/L (ref <1000)
LDL SIZE: 20.5 nm (ref >20.5)
LDL-C: 63 mg/dL (ref 0–99)
LP-IR Score: <25 (ref <=45)
SMALL LDL PARTICLE NUMBER: 353 nmol/L (ref <=527)
TRIGLYCERIDES BY NMR: 81 mg/dL (ref 0–149)

## 2016-10-30 LAB — THYROID PANEL WITH TSH
FREE THYROXINE INDEX: 2.1 (ref 1.2–4.9)
T3 UPTAKE RATIO: 29 % (ref 24–39)
T4 TOTAL: 7.2 ug/dL (ref 4.5–12.0)
TSH: 1.81 u[IU]/mL (ref 0.450–4.500)

## 2016-11-01 ENCOUNTER — Encounter: Payer: Self-pay | Admitting: Family Medicine

## 2016-11-01 ENCOUNTER — Ambulatory Visit (INDEPENDENT_AMBULATORY_CARE_PROVIDER_SITE_OTHER): Payer: Medicare Other | Admitting: Family Medicine

## 2016-11-01 VITALS — BP 116/73 | HR 55 | Temp 97.5°F | Ht 64.5 in | Wt 161.0 lb

## 2016-11-01 DIAGNOSIS — E559 Vitamin D deficiency, unspecified: Secondary | ICD-10-CM

## 2016-11-01 DIAGNOSIS — C61 Malignant neoplasm of prostate: Secondary | ICD-10-CM

## 2016-11-01 DIAGNOSIS — E7800 Pure hypercholesterolemia, unspecified: Secondary | ICD-10-CM

## 2016-11-01 DIAGNOSIS — N183 Chronic kidney disease, stage 3 unspecified: Secondary | ICD-10-CM

## 2016-11-01 DIAGNOSIS — I10 Essential (primary) hypertension: Secondary | ICD-10-CM

## 2016-11-01 DIAGNOSIS — B9789 Other viral agents as the cause of diseases classified elsewhere: Secondary | ICD-10-CM | POA: Diagnosis not present

## 2016-11-01 DIAGNOSIS — J069 Acute upper respiratory infection, unspecified: Secondary | ICD-10-CM

## 2016-11-01 DIAGNOSIS — C641 Malignant neoplasm of right kidney, except renal pelvis: Secondary | ICD-10-CM | POA: Diagnosis not present

## 2016-11-01 DIAGNOSIS — M1712 Unilateral primary osteoarthritis, left knee: Secondary | ICD-10-CM

## 2016-11-01 DIAGNOSIS — I739 Peripheral vascular disease, unspecified: Secondary | ICD-10-CM | POA: Diagnosis not present

## 2016-11-01 DIAGNOSIS — C20 Malignant neoplasm of rectum: Secondary | ICD-10-CM

## 2016-11-01 DIAGNOSIS — I2510 Atherosclerotic heart disease of native coronary artery without angina pectoris: Secondary | ICD-10-CM | POA: Diagnosis not present

## 2016-11-01 NOTE — Progress Notes (Signed)
Subjective:    Patient ID: Frederick Davis, male    DOB: 1922-12-21, 83 y.o.   MRN: [id number]  HPI Pt here for follow up and management of chronic medical problems which includes hypertension and hyperlipidemia. He is taking medications regularly.The patient recently had an upper respiratory infection and took a Z-Pak he still has some residual head congestion from this. He is otherwise doing well with this. He is had lab work and this will be reviewed with him during the visit today. He continues to be followed by the cardiologist because of his bypass surgery that he had over a year ago. He is recuperated extremely well from this. With the recent lab work he has a creatinine that has been stable over the past year at 1.52. The electrolytes were good. His hemoglobin is also stable 11.8 with a normal white blood cell count and adequate platelet count. Cholesterol numbers with advanced lipid testing have an LDL C cholesterol that is good at 63. Triglycerides are good at 81 and the good cholesterol is within normal limits at 35.4. All thyroid function tests are within normal limits and the vitamin D level is good. These results will be reviewed and he will be given a copy of this report to take home with him. The patient is positive has a good demeanor and is doing excellent for all he is been through over the past few years. He denies any chest pain or any more shortness of breath and he would expect. His says she is swallowing okay with no nausea vomiting diarrhea blood in the stool or black tarry bowel movements. He is passing his water without problems. He still sees a cardiologist regularly. His rectal exam is followed by the urologist. The patient's biggest complaint is the arthritis in his left knee. He is seen the orthopedist several times and no one wants to do any surgery on this because of his age.     Patient Active Problem List   Diagnosis Date Noted  . S/P CABG x 3 12/08/2015  . Unstable  angina ([place])   . Chest pain with moderate risk of acute coronary syndrome 12/05/2015  . Aortic stenosis, mild-2014 12/05/2015  . Anginal pain ([place]) 10/17/2015  . History of renal cell cancer-s/p nephrectomy 07/19/2013  . Hyperlipidemia 07/19/2013  . Hypotension arterial 03/18/2013  . Complete heart block ([place]) 03/18/2013  . Anemia   . CKD (chronic kidney disease) stage 3, GFR 30-59 ml/min 03/12/2013  . Acute MI, inferior wall, initial episode of care ([place]) 03/12/2013  . Impotence 03/12/2013  . History of renal stone 03/12/2013  . Hiatal hernia 03/12/2013  . H/O lithotripsy 03/12/2013  . SCCA (squamous cell carcinoma) of skin 03/12/2013  . S/P prostatectomy, radical 03/12/2013  . Left renal mass 03/12/2013  . Arthritis 03/12/2013  . History of STEMI- May 2014 03/12/2013  . CAD S/P RCA thrombectomy May 2014- residual LAD disease then 03/12/2013  . Cardiogenic shock ([place]) 03/12/2013  . Acute respiratory failure ([place]) 03/12/2013  . Altered mental status 03/12/2013  . Essential hypertension, benign 01/14/2013  . Colostomy in place [facility]) 01/14/2013  . Prostate cancer ([place]) 01/14/2013  . Renal cell carcinoma, Right 01/14/2013  . Osteoporosis, unspecified 01/14/2013   Outpatient Encounter Prescriptions as of 11/01/2016  Medication Sig  . acetaminophen (TYLENOL) 500 MG tablet Take 500 mg by mouth at bedtime.  Marland Kitchen aspirin 81 MG tablet Take 1 tablet (81 mg total) by mouth daily.  . carvedilol (COREG) 3.125 MG tablet  Take 1 tablet (3.125 mg total) by mouth 2 (two) times daily with a meal.  . furosemide (LASIX) 20 MG tablet Take 1 tablet daily as needed.  Marland Kitchen levothyroxine (SYNTHROID, LEVOTHROID) 75 MCG tablet TAKE 1 TABLET DAILY  . Misc Natural Products (OSTEO BI-FLEX/5-LOXIN ADVANCED PO) Take 1 tablet by mouth daily.  . Multiple Vitamin (MULTIVITAMIN) tablet Take 1 tablet by mouth daily.  . nitroGLYCERIN (NITROSTAT) 0.4 MG SL tablet PLACE 1 TABLET UNDER THE TONGUE AT ONSET OF CHEST PAIN EVERY  5 MINTUES UP TO 3 TIMES AS NEEDED  . amLODipine (NORVASC) 2.5 MG tablet Take 1 tablet (2.5 mg total) by mouth daily.  . [DISCONTINUED] azithromycin (ZITHROMAX) 250 MG tablet Use as directed   Facility-Administered Encounter Medications as of 11/01/2016  Medication  . etomidate (AMIDATE) injection  . lidocaine (cardiac) 100 mg/15ml (XYLOCAINE) 20 MG/ML injection 2%  . succinylcholine (ANECTINE) injection     Review of Systems  Constitutional: Negative.  Negative for fever.  HENT: Positive for congestion (head).   Eyes: Negative.   Respiratory: Negative.  Negative for cough.   Cardiovascular: Negative.   Gastrointestinal: Negative.   Endocrine: Negative.   Genitourinary: Negative.   Musculoskeletal: Negative.   Skin: Negative.   Allergic/Immunologic: Negative.   Neurological: Negative.   Hematological: Negative.   Psychiatric/Behavioral: Negative.        Objective:   Physical Exam  Constitutional: He is oriented to person, place, and time. He appears well-developed and well-nourished. No distress.  HENT:  Head: Normocephalic and atraumatic.  Right Ear: External ear normal.  Left Ear: External ear normal.  Mouth/Throat: Oropharynx is clear and moist. No oropharyngeal exudate.  Nasal congestion bilaterally  Eyes: Conjunctivae and EOM are normal. Pupils are equal, round, and reactive to light. Right eye exhibits no discharge. Left eye exhibits no discharge. No scleral icterus.  Neck: Normal range of motion. Neck supple. No thyromegaly present.  No bruits or thyromegaly  Cardiovascular: Normal rate and regular rhythm.   Murmur heard. Decrease pedal pulses on the left lower extremity and decrease radial pulse on the right upper extremity Heart has a regular rate and rhythm at 84/m with a grade 2/6 systolic ejection murmur  Pulmonary/Chest: Effort normal and breath sounds normal. No respiratory distress. He has no wheezes. He has no rales. He exhibits no tenderness.  No axillary  adenopathy. Dry cough. Clear anteriorly and posteriorly with a rare expiratory wheeze.  Abdominal: Soft. Bowel sounds are normal. He exhibits no mass. There is no tenderness. There is no rebound and no guarding.  Patient has colostomy bag that is draining because of his rectal cancer.  Musculoskeletal: He exhibits no edema.  Stiffness and swelling in both knees. Left greater than right. Tender joint line and left.  Lymphadenopathy:    He has no cervical adenopathy.  Neurological: He is alert and oriented to person, place, and time. He has normal reflexes. No cranial nerve deficit.  Skin: Skin is warm and dry. No rash noted.  Psychiatric: He has a normal mood and affect. His behavior is normal. Judgment and thought content normal.  Nursing note and vitals reviewed.  BP 116/73 (BP Location: Left Arm)   Pulse (!) 55   Temp 97.5 F (36.4 C) (Oral)   Ht 5' 4.5" (1.638 m)   Wt 161 lb (73 kg)   BMI 27.21 kg/m         Assessment & Plan:  1. Prostate cancer ([place]) -Continue to follow-up with urology  2. ASCVD (arteriosclerotic  cardiovascular disease) -Continue to follow-up with cardiology  3. CKD (chronic kidney disease) stage 3, GFR 30-59 ml/min -Continue to avoid all NSAIDs and keep blood pressure under best control as possible  4. Pure hypercholesterolemia -Continue aggressive therapeutic lifestyle changes  5. Vitamin D deficiency -Continue with current vitamin D replacement  6. Essential hypertension, benign -Continue current treatment and watch sodium intake  7. Rectal cancer [facility]) -Follow-up with gastroenterology as needed  8. Renal cell carcinoma, right ([place]) -Follow-up with nephrology yearly  9. Primary osteoarthritis of left knee -Follow-up with orthopedic surgeon even though injections have not been helpful  10. Viral upper respiratory tract infection -Continue with nasal saline cool mist humidification 20 of fluids and keep the house as cool as possible  11.  Peripheral vascular insufficiency ([facility]) -Walk and exercise as much as possible  Patient Instructions                       Medicare Annual Wellness Visit  [place] and the medical providers at [facility] strive to bring you the best medical care.  In doing so we not only want to address your current medical conditions and concerns but also to detect new conditions early and prevent illness, disease and health-related problems.    Medicare offers a yearly Wellness Visit which allows our clinical staff to assess your need for preventative services including immunizations, lifestyle education, counseling to decrease risk of preventable diseases and screening for fall risk and other medical concerns.    This visit is provided free of charge (no copay) for all Medicare recipients. The clinical pharmacists at [facility] have begun to conduct these Wellness Visits which will also include a thorough review of all your medications.    As you primary medical provider recommend that you make an appointment for your Annual Wellness Visit if you have not done so already this year.  You may set up this appointment before you leave today or you may call back [phone] and schedule an appointment.  Please make sure when you call that you mention that you are scheduling your Annual Wellness Visit with the clinical pharmacist so that the appointment may be made for the proper length of time.    Continue current medications. Continue good therapeutic lifestyle changes which include good diet and exercise. Fall precautions discussed with patient. If an FOBT was given today- please return it to our front desk. If you are over 55 years old - you may need Prevnar 47 or the adult Pneumonia vaccine.  **Flu shots are available--- please call and schedule a FLU-CLINIC appointment**  After your visit with [place] today you will receive a survey in the mail or online from  [employer] regarding your care with [place]. Please take a moment to fill this out. Your feedback is very important to [place] as you can help [place] better understand your patient needs as well as improve your experience and satisfaction. WE CARE ABOUT YOU!!!     Arrie Senate MD

## 2016-11-01 NOTE — Patient Instructions (Signed)
Medicare Annual Wellness Visit  [facility] and the medical providers at [facility] strive to bring you the best medical care.  In doing so we not only want to address your current medical conditions and concerns but also to detect new conditions early and prevent illness, disease and health-related problems.    Medicare offers a yearly Wellness Visit which allows our clinical staff to assess your need for preventative services including immunizations, lifestyle education, counseling to decrease risk of preventable diseases and screening for fall risk and other medical concerns.    This visit is provided free of charge (no copay) for all Medicare recipients. The clinical pharmacists at [facility] have begun to conduct these Wellness Visits which will also include a thorough review of all your medications.    As you primary medical provider recommend that you make an appointment for your Annual Wellness Visit if you have not done so already this year.  You may set up this appointment before you leave today or you may call back [phone]) and schedule an appointment.  Please make sure when you call that you mention that you are scheduling your Annual Wellness Visit with the clinical pharmacist so that the appointment may be made for the proper length of time.     Continue current medications. Continue good therapeutic lifestyle changes which include good diet and exercise. Fall precautions discussed with patient. If an FOBT was given today- please return it to our front desk. If you are over 50 years old - you may need Prevnar 13 or the adult Pneumonia vaccine.  **Flu shots are available--- please call and schedule a FLU-CLINIC appointment**  After your visit with us today you will receive a survey in the mail or online from [employer] regarding your care with us. Please take a moment to fill this out. Your feedback is very  important to us as you can help us better understand your patient needs as well as improve your experience and satisfaction. WE CARE ABOUT YOU!!!    

## 2016-12-02 DIAGNOSIS — D692 Other nonthrombocytopenic purpura: Secondary | ICD-10-CM | POA: Diagnosis not present

## 2016-12-02 DIAGNOSIS — Z85828 Personal history of other malignant neoplasm of skin: Secondary | ICD-10-CM | POA: Diagnosis not present

## 2016-12-02 DIAGNOSIS — L821 Other seborrheic keratosis: Secondary | ICD-10-CM | POA: Diagnosis not present

## 2016-12-02 DIAGNOSIS — L570 Actinic keratosis: Secondary | ICD-10-CM | POA: Diagnosis not present

## 2016-12-20 ENCOUNTER — Other Ambulatory Visit: Payer: Self-pay | Admitting: Cardiology

## 2017-01-06 ENCOUNTER — Other Ambulatory Visit: Payer: Self-pay | Admitting: Family Medicine

## 2017-01-15 DIAGNOSIS — D485 Neoplasm of uncertain behavior of skin: Secondary | ICD-10-CM | POA: Diagnosis not present

## 2017-01-15 DIAGNOSIS — D1801 Hemangioma of skin and subcutaneous tissue: Secondary | ICD-10-CM | POA: Diagnosis not present

## 2017-01-15 DIAGNOSIS — Z85828 Personal history of other malignant neoplasm of skin: Secondary | ICD-10-CM | POA: Diagnosis not present

## 2017-01-15 DIAGNOSIS — D225 Melanocytic nevi of trunk: Secondary | ICD-10-CM | POA: Diagnosis not present

## 2017-01-15 DIAGNOSIS — L821 Other seborrheic keratosis: Secondary | ICD-10-CM | POA: Diagnosis not present

## 2017-01-15 DIAGNOSIS — C44719 Basal cell carcinoma of skin of left lower limb, including hip: Secondary | ICD-10-CM | POA: Diagnosis not present

## 2017-01-15 DIAGNOSIS — L570 Actinic keratosis: Secondary | ICD-10-CM | POA: Diagnosis not present

## 2017-02-19 ENCOUNTER — Other Ambulatory Visit: Payer: Self-pay | Admitting: Cardiology

## 2017-02-20 ENCOUNTER — Other Ambulatory Visit: Payer: Self-pay | Admitting: Cardiology

## 2017-02-20 MED ORDER — AMLODIPINE BESYLATE 2.5 MG PO TABS: 2.5000 mg | ORAL_TABLET | Freq: Every day | ORAL | 1 refills | Status: DC

## 2017-02-20 NOTE — Telephone Encounter (Signed)
E-scribed refill, norvasc 2.5 mg #30

## 2017-02-20 NOTE — Telephone Encounter (Signed)
°*  STAT* If patient is at the pharmacy, call can be transferred to refill team.   1. Which medications need to be refilled? (please list name of each medication and dose if known) Norvasc-pt has an appointment on 03-19-17-need this asap please 2. Which pharmacy/location (including street and city if local pharmacy) is medication to be sent to?[place] [phone]  3. Do they need a 30 day or 90 day supply?[place]

## 2017-03-17 NOTE — Progress Notes (Signed)
HPI The patient presents for followup after a acute inferior wall infarct in May of 2014. He had thrombectomy of a right coronary artery occlusion. He was initially acute respiratory failure and was cooled. However, he responded very well to therapy.   Last year he had chest pain.  He was sent for cardiac catheterization. This demonstrated severe LAD stenosis and RCA stenosis. He subsequently underwent a CABG 3 on February 17.  Since I last saw him he has done relatively well.  The patient denies any new symptoms such as chest discomfort, neck or arm discomfort. There has been no new shortness of breath, PND or orthopnea. There have been no reported palpitations, presyncope or syncope.  Allergies  Allergen Reactions  . Lipitor [Atorvastatin] Other (See Comments)    Muscle aches   Prior to Admission medications   Medication Sig Start Date End Date Taking? Authorizing Provider  acetaminophen (TYLENOL) 500 MG tablet Take 500 mg by mouth at bedtime.   Yes Historical Provider, MD  amLODipine (NORVASC) 2.5 MG tablet Take 1 tablet (2.5 mg total) by mouth daily. 05/30/16 08/28/16 Yes Minus Breeding, MD  aspirin 81 MG tablet Take 1 tablet (81 mg total) by mouth daily. 03/18/13  Yes Rhonda G Barrett, PA-C  carvedilol (COREG) 3.125 MG tablet Take 1 tablet (3.125 mg total) by mouth 2 (two) times daily with a meal. 07/01/16  Yes Chipper Herb, MD  furosemide (LASIX) 20 MG tablet Take 1 tablet daily as needed. 04/03/16  Yes Minus Breeding, MD  levothyroxine (SYNTHROID, LEVOTHROID) 75 MCG tablet TAKE 1 TABLET DAILY 07/03/16  Yes Chipper Herb, MD  Misc Natural Products (OSTEO BI-FLEX/5-LOXIN ADVANCED PO) Take 1 tablet by mouth daily.   Yes Historical Provider, MD  Multiple Vitamin (MULTIVITAMIN) tablet Take 1 tablet by mouth daily.   Yes Historical Provider, MD  nitroGLYCERIN (NITROSTAT) 0.4 MG SL tablet PLACE 1 TABLET UNDER THE TONGUE AT ONSET OF CHEST PAIN EVERY 5 MINTUES UP TO 3 TIMES AS NEEDED 03/12/16  Yes  Chipper Herb, MD    Past Medical History:  Diagnosis Date  . Anemia   . CAD (coronary artery disease)    RCA throbectomy 2014  . Chronic renal insufficiency, stage III (moderate)   . Esophageal stricture   . Hypertension   . Hypothyroidism   . Myocardial infarction ([place]) 03/12/13   Inferior MI.  LAD mid 80% stenosis, circ mild plaque, RCA 100% with thrombectomy PTCA.  EF 65%  . Osteoporosis   . PC (prostate cancer) ([place]) 1987  . Rectum cancer ([place]) 1986  . Renal cell cancer [facility])     Past Surgical History:  Procedure Laterality Date  . CARDIAC CATHETERIZATION N/A 12/06/2015   Procedure: Left Heart Cath and Coronary Angiography;  Surgeon: Burnell Blanks, MD;  Location: [place] CV LAB;  Service: Cardiovascular;  Laterality: N/A;  . COLONOSCOPY  01/08/2012   Procedure: COLONOSCOPY;  Surgeon: Rogene Houston, MD;  [facility];  Service: Endoscopy;  Laterality: N/A;  1030  . COLONOSCOPY N/A 07/20/2015   Procedure: COLONOSCOPY;  Surgeon: Rogene Houston, MD;  [facility];  Service: Endoscopy;  Laterality: N/A;  930 - moved to 9/29 @ 2:00  . COLOSTOMY Left 1986   colon cancer  . CORONARY ARTERY BYPASS GRAFT N/A 12/08/2015   Procedure: CORONARY ARTERY BYPASS GRAFTING (CABG);  Surgeon: Melrose Nakayama, MD;  Location: [place];  Service: Open Heart Surgery;  Laterality: N/A;  Times 3  using left internal mammary artery and endoscopycally harvested bilateral saphenous vein.  Marland Kitchen HERNIA REPAIR     2007  . KIDNEY SURGERY  2004  . LEFT HEART CATHETERIZATION WITH CORONARY ANGIOGRAM N/A 03/12/2013   Procedure: STEMI;  Surgeon: Burnell Blanks, MD;  Location: [facility] CATH LAB;  Service: Cardiovascular;  Laterality: N/A;  . PERCUTANEOUS CORONARY INTERVENTION-[place] ONLY    . PROSTATECTOMY    . [place]  2006  . renal sphincter appliance  2007  . Right inguinal hernia repair    . TEE WITHOUT CARDIOVERSION N/A 12/08/2015   Procedure:  TRANSESOPHAGEAL ECHOCARDIOGRAM (TEE);  Surgeon: Melrose Nakayama, MD;  Location: [place];  Service: Open Heart Surgery;  Laterality: N/A;    ROS:   Joint pain.  Otherwise as stated in the HPI and negative for all other systems.  PHYSICAL EXAM BP 140/80   Pulse (!) 54   Ht 5\' 6"  (1.676 m)   Wt 166 lb (75.3 kg)   BMI 26.79 kg/m   GENERAL:  Well appearing for his age NECK:  No jugular venous distention, waveform within normal limits, carotid upstroke brisk and symmetric, no bruits, no thyromegaly LYMPHATICS:  No cervical, inguinal adenopathy LUNGS:  Clear to auscultation bilaterally BACK:  No CVA tenderness CHEST:  Well healed sternotomy scar. HEART:  PMI not displaced or sustained,S1 and S2 within normal limits, no S3, no S4, no clicks, no rubs, 3 out of 6 apical systolic murmur radiating slightly out the aortic outflow tract, no diastolic murmurs ABD:  Flat, positive bowel sounds normal in frequency in pitch, no bruits, no rebound, no guarding, no midline pulsatile mass, no hepatomegaly, no splenomegaly EXT:  2 plus pulses throughout, mild bilateral ankle edema, no cyanosis no clubbing   EKG:  Sinus rhythm, rate 54, right bundle branch block, no acute ST-T wave changes.  ASSESSMENT AND PLAN  CAD/CABG:   He's doing well from a cardiovascular standpoint I encourage more physical activity as he does have a stationary bike at home    HTN:  The blood pressure is slightly elevated but I reviewed his home pressure diary and it fluctuates a little sore leave him on the meds as listed.    DYSLIPIDEMIA:  He has been intolerant of 1 statin in the past. We will pursue conservative management. I'll not start another medication.  AORTIC STENOSIS:  This is moderate.  I will follow this clinically.  EDEMA:  This is  stable.  There will be no change in therapy.

## 2017-03-19 ENCOUNTER — Encounter: Payer: Self-pay | Admitting: Cardiology

## 2017-03-19 ENCOUNTER — Ambulatory Visit (INDEPENDENT_AMBULATORY_CARE_PROVIDER_SITE_OTHER): Payer: Medicare Other | Admitting: Cardiology

## 2017-03-19 VITALS — BP 140/80 | HR 54 | Ht 66.0 in | Wt 166.0 lb

## 2017-03-19 DIAGNOSIS — I350 Nonrheumatic aortic (valve) stenosis: Secondary | ICD-10-CM | POA: Diagnosis not present

## 2017-03-19 DIAGNOSIS — E785 Hyperlipidemia, unspecified: Secondary | ICD-10-CM

## 2017-03-19 DIAGNOSIS — I2510 Atherosclerotic heart disease of native coronary artery without angina pectoris: Secondary | ICD-10-CM

## 2017-03-19 DIAGNOSIS — I10 Essential (primary) hypertension: Secondary | ICD-10-CM | POA: Diagnosis not present

## 2017-03-19 NOTE — Patient Instructions (Signed)
  Medication Instructions:  The current medical regimen is effective;  continue present plan and medications.  Follow-Up: Follow up in 6 months with Dr. Hochrein.  You will receive a letter in the mail 2 months before you are due.  Please call us when you receive this letter to schedule your follow up appointment.  If you need a refill on your cardiac medications before your next appointment, please call your pharmacy.  Thank you for choosing [facility]!!       

## 2017-03-24 ENCOUNTER — Other Ambulatory Visit: Payer: Medicare Other

## 2017-03-24 ENCOUNTER — Other Ambulatory Visit: Payer: Self-pay | Admitting: *Deleted

## 2017-03-24 DIAGNOSIS — N183 Chronic kidney disease, stage 3 unspecified: Secondary | ICD-10-CM

## 2017-03-24 DIAGNOSIS — C61 Malignant neoplasm of prostate: Secondary | ICD-10-CM | POA: Diagnosis not present

## 2017-03-24 DIAGNOSIS — D509 Iron deficiency anemia, unspecified: Secondary | ICD-10-CM | POA: Diagnosis not present

## 2017-03-24 DIAGNOSIS — E7800 Pure hypercholesterolemia, unspecified: Secondary | ICD-10-CM | POA: Diagnosis not present

## 2017-03-24 DIAGNOSIS — I10 Essential (primary) hypertension: Secondary | ICD-10-CM

## 2017-03-24 DIAGNOSIS — E785 Hyperlipidemia, unspecified: Secondary | ICD-10-CM

## 2017-03-25 LAB — HEPATIC FUNCTION PANEL
ALBUMIN: 3.9 g/dL (ref 3.2–4.6)
ALT: 10 IU/L (ref 0–44)
AST: 19 IU/L (ref 0–40)
Alkaline Phosphatase: 129 IU/L — ABNORMAL HIGH (ref 39–117)
Bilirubin Total: 0.3 mg/dL (ref 0.0–1.2)
Bilirubin, Direct: 0.12 mg/dL (ref 0.00–0.40)
TOTAL PROTEIN: 6.2 g/dL (ref 6.0–8.5)

## 2017-03-25 LAB — LIPID PANEL
CHOL/HDL RATIO: 2.2 ratio (ref 0.0–5.0)
CHOLESTEROL TOTAL: 148 mg/dL (ref 100–199)
HDL: 66 mg/dL (ref >39)
LDL CALC: 66 mg/dL (ref 0–99)
TRIGLYCERIDES: 78 mg/dL (ref 0–149)
VLDL Cholesterol Cal: 16 mg/dL (ref 5–40)

## 2017-03-25 LAB — PSA, TOTAL AND FREE
PSA, Free: <0.01 ng/mL
Prostate Specific Ag, Serum: <0.1 ng/mL (ref 0.0–4.0)

## 2017-03-25 LAB — BMP8+EGFR
BUN/Creatinine Ratio: 21 (ref 10–24)
BUN: 31 mg/dL (ref 10–36)
CHLORIDE: 106 mmol/L (ref 96–106)
CO2: 20 mmol/L (ref 18–29)
Calcium: 10.3 mg/dL — ABNORMAL HIGH (ref 8.6–10.2)
Creatinine, Ser: 1.5 mg/dL — ABNORMAL HIGH (ref 0.76–1.27)
GFR calc Af Amer: 46 mL/min/{1.73_m2} — ABNORMAL LOW (ref >59)
GFR calc non Af Amer: 40 mL/min/{1.73_m2} — ABNORMAL LOW (ref >59)
GLUCOSE: 82 mg/dL (ref 65–99)
POTASSIUM: 4.8 mmol/L (ref 3.5–5.2)
SODIUM: 142 mmol/L (ref 134–144)

## 2017-03-25 LAB — CBC WITH DIFFERENTIAL/PLATELET
BASOS ABS: 0.1 10*3/uL (ref 0.0–0.2)
Basos: 1 %
EOS (ABSOLUTE): 0.3 10*3/uL (ref 0.0–0.4)
Eos: 5 %
Hematocrit: 36.6 % — ABNORMAL LOW (ref 37.5–51.0)
Hemoglobin: 11.8 g/dL — ABNORMAL LOW (ref 13.0–17.7)
Immature Grans (Abs): 0 10*3/uL (ref 0.0–0.1)
Immature Granulocytes: 0 %
Lymphocytes Absolute: 1.6 10*3/uL (ref 0.7–3.1)
Lymphs: 23 %
MCH: 26.9 pg (ref 26.6–33.0)
MCHC: 32.2 g/dL (ref 31.5–35.7)
MCV: 84 fL (ref 79–97)
MONOS ABS: 0.7 10*3/uL (ref 0.1–0.9)
Monocytes: 9 %
Neutrophils Absolute: 4.4 10*3/uL (ref 1.4–7.0)
Neutrophils: 62 %
Platelets: 257 10*3/uL (ref 150–379)
RBC: 4.38 x10E6/uL (ref 4.14–5.80)
RDW: 14.4 % (ref 12.3–15.4)
WBC: 7.1 10*3/uL (ref 3.4–10.8)

## 2017-03-26 ENCOUNTER — Ambulatory Visit: Payer: Medicare Other | Admitting: Family Medicine

## 2017-03-31 ENCOUNTER — Ambulatory Visit (INDEPENDENT_AMBULATORY_CARE_PROVIDER_SITE_OTHER): Payer: Medicare Other | Admitting: Family Medicine

## 2017-03-31 ENCOUNTER — Encounter: Payer: Self-pay | Admitting: Family Medicine

## 2017-03-31 VITALS — BP 114/73 | HR 52 | Temp 96.9°F | Ht 66.0 in | Wt 164.0 lb

## 2017-03-31 DIAGNOSIS — C641 Malignant neoplasm of right kidney, except renal pelvis: Secondary | ICD-10-CM | POA: Diagnosis not present

## 2017-03-31 DIAGNOSIS — I2510 Atherosclerotic heart disease of native coronary artery without angina pectoris: Secondary | ICD-10-CM

## 2017-03-31 DIAGNOSIS — N183 Chronic kidney disease, stage 3 unspecified: Secondary | ICD-10-CM

## 2017-03-31 DIAGNOSIS — E7800 Pure hypercholesterolemia, unspecified: Secondary | ICD-10-CM

## 2017-03-31 DIAGNOSIS — E559 Vitamin D deficiency, unspecified: Secondary | ICD-10-CM

## 2017-03-31 DIAGNOSIS — I10 Essential (primary) hypertension: Secondary | ICD-10-CM

## 2017-03-31 DIAGNOSIS — C61 Malignant neoplasm of prostate: Secondary | ICD-10-CM

## 2017-03-31 NOTE — Progress Notes (Signed)
Subjective:    Patient ID: Frederick Davis, male    DOB: 06/17/1923, 83 y.o.   MRN: [id number]  HPI Pt here for follow up and management of chronic medical problems which includes hyperlipidemia and hypertension. He is taking medication regularly.The patient is doing well overall. He has a history of renal cell carcinoma, rectal cancer, prostate cancer as well as chronic kidney disease ASCVD and hyperlipidemia. He is had a CABG done in the past couple of years. He still sees a cardiologist regularly. He has no specific complaints today. Cholesterol numbers with traditional lipid testing were good with an LDL C cholesterol at goal at 66. Triglycerides are good at 78. The blood sugar was good at 82. His creatinine remains slightly elevated but stable at 1.50. All electrolytes were normal. CBC had a normal white blood cell count and a stable hemoglobin 11.8. The platelet count was adequate. All liver function tests were within normal limits except one was slightly elevated at 129. This was the alkaline phosphatase. The patient continues to do extremely well considering all he is been through over the past couple years. He is seen the cardiologist now about every 6 months. He sees the urologist yearly. He will be given an FOBT to return today.    Patient Active Problem List   Diagnosis Date Noted  . Coronary artery disease involving native coronary artery of native heart without angina pectoris 03/19/2017  . Dyslipidemia 03/19/2017  . S/P CABG x 3 12/08/2015  . Unstable angina ([place])   . Chest pain with moderate risk of acute coronary syndrome 12/05/2015  . Aortic stenosis, mild-2014 12/05/2015  . Anginal pain ([place]) 10/17/2015  . History of renal cell cancer-s/p nephrectomy 07/19/2013  . Hyperlipidemia 07/19/2013  . Hypotension arterial 03/18/2013  . Complete heart block ([place]) 03/18/2013  . Anemia   . CKD (chronic kidney disease) stage 3, GFR 30-59 ml/min 03/12/2013  . Acute MI, inferior wall,  initial episode of care ([place]) 03/12/2013  . Impotence 03/12/2013  . History of renal stone 03/12/2013  . Hiatal hernia 03/12/2013  . H/O lithotripsy 03/12/2013  . SCCA (squamous cell carcinoma) of skin 03/12/2013  . S/P prostatectomy, radical 03/12/2013  . Left renal mass 03/12/2013  . Arthritis 03/12/2013  . History of STEMI- May 2014 03/12/2013  . CAD S/P RCA thrombectomy May 2014- residual LAD disease then 03/12/2013  . Cardiogenic shock ([place]) 03/12/2013  . Acute respiratory failure ([place]) 03/12/2013  . Altered mental status 03/12/2013  . Essential hypertension, benign 01/14/2013  . Colostomy in place [facility]) 01/14/2013  . Prostate cancer ([place]) 01/14/2013  . Renal cell carcinoma, Right 01/14/2013  . Osteoporosis, unspecified 01/14/2013   Outpatient Encounter Prescriptions as of 03/31/2017  Medication Sig  . acetaminophen (TYLENOL) 500 MG tablet Take 500 mg by mouth at bedtime.  Marland Kitchen amLODipine (NORVASC) 2.5 MG tablet Take 1 tablet (2.5 mg total) by mouth daily.  Marland Kitchen aspirin 81 MG tablet Take 1 tablet (81 mg total) by mouth daily.  . carvedilol (COREG) 3.125 MG tablet Take 1 tablet (3.125 mg total) by mouth 2 (two) times daily with a meal.  . furosemide (LASIX) 20 MG tablet Take 20 mg by mouth daily as needed.  Marland Kitchen levothyroxine (SYNTHROID, LEVOTHROID) 75 MCG tablet Take 75 mcg by mouth daily before breakfast.  . Misc Natural Products (OSTEO BI-FLEX/5-LOXIN ADVANCED PO) Take 1 tablet by mouth daily.  . Multiple Vitamin (MULTIVITAMIN) tablet Take 1 tablet by mouth daily.  . nitroGLYCERIN (NITROSTAT) 0.4 MG SL  tablet PLACE 1 TABLET UNDER THE TONGUE AT ONSET OF CHEST PAIN EVERY 5 MINTUES UP TO 3 TIMES AS NEEDED   Facility-Administered Encounter Medications as of 03/31/2017  Medication  . etomidate (AMIDATE) injection  . lidocaine (cardiac) 100 mg/12ml (XYLOCAINE) 20 MG/ML injection 2%  . succinylcholine (ANECTINE) injection      Review of Systems  Constitutional: Negative.   HENT:  Negative.   Eyes: Negative.   Respiratory: Negative.   Cardiovascular: Negative.   Gastrointestinal: Negative.   Endocrine: Negative.   Genitourinary: Negative.   Musculoskeletal: Negative.   Skin: Negative.   Allergic/Immunologic: Negative.   Neurological: Negative.   Hematological: Negative.   Psychiatric/Behavioral: Negative.        Objective:   Physical Exam  Constitutional: He is oriented to person, place, and time. He appears well-developed and well-nourished. No distress.  The patient is pleasant and alert and looks much younger than his stated age of 83 years.  HENT:  Head: Normocephalic and atraumatic.  Right Ear: External ear normal.  Left Ear: External ear normal.  Nose: Nose normal.  Mouth/Throat: Oropharynx is clear and moist. No oropharyngeal exudate.  Eyes: Conjunctivae and EOM are normal. Pupils are equal, round, and reactive to light. Right eye exhibits no discharge. Left eye exhibits no discharge. No scleral icterus.  Neck: Normal range of motion. Neck supple. No thyromegaly present.  No bruits thyromegaly or anterior cervical adenopathy  Cardiovascular: Normal rate, regular rhythm and intact distal pulses.   Murmur heard. The heart has a regular rate and rhythm at 60/m with a grade 3/6 systolic ejection murmur  Pulmonary/Chest: Effort normal and breath sounds normal. No respiratory distress. He has no wheezes. He has no rales. He exhibits no tenderness.  Clear anteriorly and posteriorly  Abdominal: Soft. Bowel sounds are normal. He exhibits no distension and no mass. There is no tenderness. There is no rebound and no guarding.  The patient has a colostomy bag in place. The abdomen was soft without masses tenderness or organ enlargement or distention  Genitourinary: Rectum normal.  Genitourinary Comments: The patient is followed regularly by the urologist  Musculoskeletal: Normal range of motion. He exhibits no edema.  He uses a cane for ambulation because of  degenerative joint disease in the left knee.  Lymphadenopathy:    He has no cervical adenopathy.  Neurological: He is alert and oriented to person, place, and time. He has normal reflexes. No cranial nerve deficit.  Skin: Skin is warm and dry. No rash noted.  Psychiatric: He has a normal mood and affect. His behavior is normal. Judgment and thought content normal.  Nursing note and vitals reviewed.  BP 114/73 (BP Location: Left Arm)   Pulse (!) 52   Temp (!) 96.9 F (36.1 C) (Oral)   Ht 5\' 6"  (1.676 m)   Wt 164 lb (74.4 kg)   BMI 26.47 kg/m         Assessment & Plan:  1. ASCVD (arteriosclerotic cardiovascular disease) -Follow-up with cardiology as planned  2. Prostate cancer [facility]) -Follow-up with urology as planned  3. CKD (chronic kidney disease) stage 3, GFR 30-59 ml/min -The most recent creatinine was stable compared to past readings.  4. Pure hypercholesterolemia -Continue with aggressive therapeutic lifestyle changes  5. Vitamin D deficiency -Continue with vitamin D replacement  6. Essential hypertension, benign -Blood pressure is good today and he will continue with current treatment  7. Renal cell carcinoma, right [facility]) -Follow-up with urology as planned  Patient Instructions  Medicare Annual Wellness Visit  [facility] [facility] and the medical providers at [facility] strive to bring you the best medical care.  In doing so we not only want to address your current medical conditions and concerns but also to detect new conditions early and prevent illness, disease and health-related problems.    Medicare offers a yearly Wellness Visit which allows our clinical staff to assess your need for preventative services including immunizations, lifestyle education, counseling to decrease risk of preventable diseases and screening for fall risk and other medical concerns.    This visit is provided free of charge (no copay) for all  Medicare recipients. The clinical pharmacists at [facility] have begun to conduct these Wellness Visits which will also include a thorough review of all your medications.    As you primary medical provider recommend that you make an appointment for your Annual Wellness Visit if you have not done so already this year.  You may set up this appointment before you leave today or you may call back ([phone]) and schedule an appointment.  Please make sure when you call that you mention that you are scheduling your Annual Wellness Visit with the clinical pharmacist so that the appointment may be made for the proper length of time.     Continue current medications. Continue good therapeutic lifestyle changes which include good diet and exercise. Fall precautions discussed with patient. If an FOBT was given today- please return it to our front desk. If you are over 34 years old - you may need Prevnar 10 or the adult Pneumonia vaccine.  **Flu shots are available--- please call and schedule a FLU-CLINIC appointment**  After your visit with [place] today you will receive a survey in the mail or online from [employer] regarding your care with [place]. Please take a moment to fill this out. Your feedback is very important to [place] as you can help [place] better understand your patient needs as well as improve your experience and satisfaction. WE CARE ABOUT YOU!!!   The patient needs to get an eye exam. He should follow-up regularly with the cardiologist as planned He should rest during the hotter parts of the day and should drink plenty of fluids and stay well hydrated especially during the summer months.  Arrie Senate MD

## 2017-03-31 NOTE — Patient Instructions (Addendum)
Medicare Annual Wellness Visit  [facility] and the medical providers at [facility] strive to bring you the best medical care.  In doing so we not only want to address your current medical conditions and concerns but also to detect new conditions early and prevent illness, disease and health-related problems.    Medicare offers a yearly Wellness Visit which allows our clinical staff to assess your need for preventative services including immunizations, lifestyle education, counseling to decrease risk of preventable diseases and screening for fall risk and other medical concerns.    This visit is provided free of charge (no copay) for all Medicare recipients. The clinical pharmacists at [facility] have begun to conduct these Wellness Visits which will also include a thorough review of all your medications.    As you primary medical provider recommend that you make an appointment for your Annual Wellness Visit if you have not done so already this year.  You may set up this appointment before you leave today or you may call back ([phone]) and schedule an appointment.  Please make sure when you call that you mention that you are scheduling your Annual Wellness Visit with the clinical pharmacist so that the appointment may be made for the proper length of time.     Continue current medications. Continue good therapeutic lifestyle changes which include good diet and exercise. Fall precautions discussed with patient. If an FOBT was given today- please return it to our front desk. If you are over 81 years old - you may need Prevnar 65 or the adult Pneumonia vaccine.  **Flu shots are available--- please call and schedule a FLU-CLINIC appointment**  After your visit with [place] today you will receive a survey in the mail or online from [employer] regarding your care with [place]. Please take a moment to fill this out. Your feedback is very  important to [place] as you can help [place] better understand your patient needs as well as improve your experience and satisfaction. WE CARE ABOUT YOU!!!   The patient needs to get an eye exam. He should follow-up regularly with the cardiologist as planned He should rest during the hotter parts of the day and should drink plenty of fluids and stay well hydrated especially during the summer months.

## 2017-04-05 ENCOUNTER — Other Ambulatory Visit: Payer: Self-pay | Admitting: Cardiology

## 2017-04-07 NOTE — Telephone Encounter (Signed)
Rx(s) sent to pharmacy electronically.  

## 2017-04-16 DIAGNOSIS — Z8546 Personal history of malignant neoplasm of prostate: Secondary | ICD-10-CM | POA: Diagnosis not present

## 2017-04-16 DIAGNOSIS — K8020 Calculus of gallbladder without cholecystitis without obstruction: Secondary | ICD-10-CM | POA: Diagnosis not present

## 2017-04-16 DIAGNOSIS — R918 Other nonspecific abnormal finding of lung field: Secondary | ICD-10-CM | POA: Diagnosis not present

## 2017-04-16 DIAGNOSIS — N281 Cyst of kidney, acquired: Secondary | ICD-10-CM | POA: Diagnosis not present

## 2017-04-16 DIAGNOSIS — K439 Ventral hernia without obstruction or gangrene: Secondary | ICD-10-CM | POA: Diagnosis not present

## 2017-04-16 DIAGNOSIS — N289 Disorder of kidney and ureter, unspecified: Secondary | ICD-10-CM | POA: Diagnosis not present

## 2017-04-16 DIAGNOSIS — N2889 Other specified disorders of kidney and ureter: Secondary | ICD-10-CM | POA: Diagnosis not present

## 2017-04-16 DIAGNOSIS — K862 Cyst of pancreas: Secondary | ICD-10-CM | POA: Diagnosis not present

## 2017-04-16 DIAGNOSIS — K8689 Other specified diseases of pancreas: Secondary | ICD-10-CM | POA: Diagnosis not present

## 2017-04-16 DIAGNOSIS — Z905 Acquired absence of kidney: Secondary | ICD-10-CM | POA: Diagnosis not present

## 2017-04-16 DIAGNOSIS — Z85528 Personal history of other malignant neoplasm of kidney: Secondary | ICD-10-CM | POA: Diagnosis not present

## 2017-04-21 ENCOUNTER — Other Ambulatory Visit: Payer: Self-pay | Admitting: Cardiology

## 2017-04-24 ENCOUNTER — Ambulatory Visit (INDEPENDENT_AMBULATORY_CARE_PROVIDER_SITE_OTHER): Payer: Medicare Other | Admitting: Physician Assistant

## 2017-04-24 ENCOUNTER — Encounter: Payer: Self-pay | Admitting: Physician Assistant

## 2017-04-24 VITALS — BP 136/86 | HR 73 | Temp 97.3°F | Ht 66.0 in | Wt 164.0 lb

## 2017-04-24 DIAGNOSIS — R3915 Urgency of urination: Secondary | ICD-10-CM

## 2017-04-24 DIAGNOSIS — I2510 Atherosclerotic heart disease of native coronary artery without angina pectoris: Secondary | ICD-10-CM | POA: Diagnosis not present

## 2017-04-24 LAB — URINALYSIS
BILIRUBIN UA: NEGATIVE
GLUCOSE, UA: NEGATIVE
KETONES UA: NEGATIVE
Leukocytes, UA: NEGATIVE
Nitrite, UA: NEGATIVE
SPEC GRAV UA: 1.015 (ref 1.005–1.030)
UUROB: 0.2 mg/dL (ref 0.2–1.0)
pH, UA: 5.5 (ref 5.0–7.5)

## 2017-04-24 NOTE — Progress Notes (Signed)
Subjective:     Patient ID: Frederick Davis, male   DOB: December 18, 1922, 83 y.o.   MRN: [id number]  HPI Pt has noted increase in freq or urination with some penile irritation for 2-3 days He took some Macrobid he had at the house and sx have improved S/P prostatectomy secondary to Ca  Review of Systems  Constitutional: Negative.   Gastrointestinal: Negative.   Genitourinary: Positive for dysuria and frequency. Negative for difficulty urinating, flank pain, hematuria, penile pain and testicular pain.       Objective:   Physical Exam  Constitutional: He appears well-developed and well-nourished.  HENT:  Mouth/Throat: Oropharynx is clear and moist. No oropharyngeal exudate.  Abdominal: Soft. He exhibits no distension and no mass. There is no tenderness. There is no rebound and no guarding.  No CVAT  Nursing note and vitals reviewed. Urine- see labs     Assessment:     1. Urinary urgency        Plan:     Since sx have been improving continue with Macrobid Will sent for C&S OTC AZO for sx F/U pending lab results

## 2017-04-24 NOTE — Patient Instructions (Signed)

## 2017-04-25 ENCOUNTER — Ambulatory Visit (INDEPENDENT_AMBULATORY_CARE_PROVIDER_SITE_OTHER): Payer: Medicare Other | Admitting: Physician Assistant

## 2017-04-25 ENCOUNTER — Encounter: Payer: Self-pay | Admitting: Physician Assistant

## 2017-04-25 VITALS — BP 137/86 | HR 82 | Temp 97.4°F | Ht 66.0 in | Wt 164.0 lb

## 2017-04-25 DIAGNOSIS — N4889 Other specified disorders of penis: Secondary | ICD-10-CM

## 2017-04-25 DIAGNOSIS — N50819 Testicular pain, unspecified: Secondary | ICD-10-CM

## 2017-04-25 DIAGNOSIS — I2510 Atherosclerotic heart disease of native coronary artery without angina pectoris: Secondary | ICD-10-CM | POA: Diagnosis not present

## 2017-04-25 LAB — URINE CULTURE: Organism ID, Bacteria: NO GROWTH

## 2017-04-25 LAB — URINALYSIS
BILIRUBIN UA: NEGATIVE
Glucose, UA: NEGATIVE
Ketones, UA: NEGATIVE
Leukocytes, UA: NEGATIVE
Nitrite, UA: NEGATIVE
PH UA: 5 (ref 5.0–7.5)
PROTEIN UA: NEGATIVE
Specific Gravity, UA: 1.01 (ref 1.005–1.030)
Urobilinogen, Ur: 0.2 mg/dL (ref 0.2–1.0)

## 2017-04-25 NOTE — Progress Notes (Signed)
Subjective:     Patient ID: Frederick Davis, male   DOB: Aug 14, 1923, 83 y.o.   MRN: [id number]  HPI Pt with prominence to the L ing region with radiation of sx to the L testicle/scrotum Still taking Macrobid for urinary sx Hx of R ing hernia  Review of Systems     Objective:   Physical Exam  Constitutional: He appears well-developed and well-nourished.  Genitourinary: Penis normal. No penile tenderness.  Nursing note and vitals reviewed. + fullness to the L ing region + TTP of the L ing canal  Hard to ascertain hernia due to implant on the L side of scrotum No ing nodes palp     Assessment:     1. Testicle pain   2. Penile pain        Plan:     Refer to Gen Surg for consultation Continue ATB Still awaiting culture results Will inform of results

## 2017-04-25 NOTE — Patient Instructions (Signed)
Hernia A hernia happens when an organ or tissue inside your body pushes out through a weak spot in the belly (abdomen). Follow these instructions at home:  Avoid stretching or overusing (straining) the muscles near the hernia.  Do not lift anything heavier than 10 lb (4.5 kg).  Use the muscles in your leg when you lift something up. Do not use the muscles in your back.  When you cough, try to cough gently.  Eat a diet that has a lot of fiber. Eat lots of fruits and vegetables.  Drink enough fluids to keep your pee (urine) clear or pale yellow. Try to drink 6-8 glasses of water a day.  Take medicines to make your poop soft (stool softeners) as told by your doctor.  Lose weight, if you are overweight.  Do not use any tobacco products, including cigarettes, chewing tobacco, or electronic cigarettes. If you need help quitting, ask your doctor.  Keep all follow-up visits as told by your doctor. This is important. Contact a doctor if:  The skin by the hernia gets puffy (swollen) or red.  The hernia is painful. Get help right away if:  You have a fever.  You have belly pain that is getting worse.  You feel sick to your stomach (nauseous) or you throw up (vomit).  You cannot push the hernia back in place by gently pressing on it while you are lying down.  The hernia: ? Changes in shape or size. ? Is stuck outside your belly. ? Changes color. ? Feels hard or tender. This information is not intended to replace advice given to you by your health care provider. Make sure you discuss any questions you have with your health care provider. Document Released: 03/27/2010 Document Revised: 03/14/2016 Document Reviewed: 08/17/2014 Elsevier Interactive Patient Education  2018 [employer]  

## 2017-04-26 LAB — URINE CULTURE

## 2017-05-06 ENCOUNTER — Telehealth: Payer: Self-pay | Admitting: Family Medicine

## 2017-05-06 NOTE — Telephone Encounter (Signed)
Pt aware of results 

## 2017-05-19 DIAGNOSIS — Z85048 Personal history of other malignant neoplasm of rectum, rectosigmoid junction, and anus: Secondary | ICD-10-CM | POA: Diagnosis not present

## 2017-05-19 DIAGNOSIS — T83111A Breakdown (mechanical) of urinary sphincter implant, initial encounter: Secondary | ICD-10-CM | POA: Diagnosis not present

## 2017-05-19 DIAGNOSIS — N5089 Other specified disorders of the male genital organs: Secondary | ICD-10-CM | POA: Diagnosis not present

## 2017-05-19 DIAGNOSIS — Z951 Presence of aortocoronary bypass graft: Secondary | ICD-10-CM | POA: Diagnosis not present

## 2017-05-19 DIAGNOSIS — Z9079 Acquired absence of other genital organ(s): Secondary | ICD-10-CM | POA: Diagnosis not present

## 2017-05-19 DIAGNOSIS — Z905 Acquired absence of kidney: Secondary | ICD-10-CM | POA: Diagnosis not present

## 2017-05-19 DIAGNOSIS — Z933 Colostomy status: Secondary | ICD-10-CM | POA: Diagnosis not present

## 2017-05-27 ENCOUNTER — Encounter (HOSPITAL_COMMUNITY): Payer: Self-pay | Admitting: *Deleted

## 2017-05-27 ENCOUNTER — Observation Stay (HOSPITAL_COMMUNITY)
Admission: EM | Admit: 2017-05-27 | Discharge: 2017-05-28 | Disposition: A | Discharge status: Home / Self Care | Payer: Medicare Other | Attending: Internal Medicine | Admitting: Internal Medicine

## 2017-05-27 ENCOUNTER — Emergency Department (HOSPITAL_COMMUNITY): Payer: Medicare Other

## 2017-05-27 DIAGNOSIS — Z951 Presence of aortocoronary bypass graft: Secondary | ICD-10-CM

## 2017-05-27 DIAGNOSIS — I129 Hypertensive chronic kidney disease with stage 1 through stage 4 chronic kidney disease, or unspecified chronic kidney disease: Secondary | ICD-10-CM | POA: Diagnosis not present

## 2017-05-27 DIAGNOSIS — I350 Nonrheumatic aortic (valve) stenosis: Secondary | ICD-10-CM | POA: Diagnosis not present

## 2017-05-27 DIAGNOSIS — Z79899 Other long term (current) drug therapy: Secondary | ICD-10-CM | POA: Insufficient documentation

## 2017-05-27 DIAGNOSIS — Z87891 Personal history of nicotine dependence: Secondary | ICD-10-CM | POA: Diagnosis not present

## 2017-05-27 DIAGNOSIS — N183 Chronic kidney disease, stage 3 unspecified: Secondary | ICD-10-CM | POA: Diagnosis present

## 2017-05-27 DIAGNOSIS — R072 Precordial pain: Principal | ICD-10-CM | POA: Insufficient documentation

## 2017-05-27 DIAGNOSIS — I340 Nonrheumatic mitral (valve) insufficiency: Secondary | ICD-10-CM

## 2017-05-27 DIAGNOSIS — R0602 Shortness of breath: Secondary | ICD-10-CM | POA: Diagnosis not present

## 2017-05-27 DIAGNOSIS — R079 Chest pain, unspecified: Secondary | ICD-10-CM | POA: Diagnosis not present

## 2017-05-27 DIAGNOSIS — Z85528 Personal history of other malignant neoplasm of kidney: Secondary | ICD-10-CM | POA: Diagnosis not present

## 2017-05-27 DIAGNOSIS — I2510 Atherosclerotic heart disease of native coronary artery without angina pectoris: Secondary | ICD-10-CM

## 2017-05-27 DIAGNOSIS — I10 Essential (primary) hypertension: Secondary | ICD-10-CM | POA: Diagnosis not present

## 2017-05-27 DIAGNOSIS — I252 Old myocardial infarction: Secondary | ICD-10-CM | POA: Diagnosis not present

## 2017-05-27 DIAGNOSIS — Z7982 Long term (current) use of aspirin: Secondary | ICD-10-CM | POA: Insufficient documentation

## 2017-05-27 DIAGNOSIS — R0789 Other chest pain: Secondary | ICD-10-CM | POA: Diagnosis not present

## 2017-05-27 DIAGNOSIS — Z9861 Coronary angioplasty status: Secondary | ICD-10-CM

## 2017-05-27 HISTORY — DX: Nonrheumatic aortic (valve) stenosis: I35.0

## 2017-05-27 HISTORY — DX: Unspecified right bundle-branch block: I45.10

## 2017-05-27 HISTORY — DX: Nonrheumatic mitral (valve) insufficiency: I34.0

## 2017-05-27 LAB — BASIC METABOLIC PANEL
Anion gap: 9 (ref 5–15)
BUN: 35 mg/dL — AB (ref 6–20)
CO2: 22 mmol/L (ref 22–32)
Calcium: 9.6 mg/dL (ref 8.9–10.3)
Chloride: 105 mmol/L (ref 101–111)
Creatinine, Ser: 1.56 mg/dL — ABNORMAL HIGH (ref 0.61–1.24)
GFR calc Af Amer: 43 mL/min — ABNORMAL LOW (ref >60)
GFR, EST NON AFRICAN AMERICAN: 37 mL/min — AB (ref >60)
GLUCOSE: 103 mg/dL — AB (ref 65–99)
POTASSIUM: 4.3 mmol/L (ref 3.5–5.1)
Sodium: 136 mmol/L (ref 135–145)

## 2017-05-27 LAB — I-STAT TROPONIN, ED: Troponin i, poc: 0 ng/mL (ref 0.00–0.08)

## 2017-05-27 LAB — CBC
HEMATOCRIT: 34.9 % — AB (ref 39.0–52.0)
Hemoglobin: 11 g/dL — ABNORMAL LOW (ref 13.0–17.0)
MCH: 27 pg (ref 26.0–34.0)
MCHC: 31.5 g/dL (ref 30.0–36.0)
MCV: 85.5 fL (ref 78.0–100.0)
Platelets: 207 10*3/uL (ref 150–400)
RBC: 4.08 MIL/uL — ABNORMAL LOW (ref 4.22–5.81)
RDW: 14 % (ref 11.5–15.5)
WBC: 7.3 10*3/uL (ref 4.0–10.5)

## 2017-05-27 MED ORDER — AMLODIPINE BESYLATE 5 MG PO TABS
2.5000 mg | ORAL_TABLET | Freq: Every day | ORAL | Status: DC
  Administered 2017-05-28: 2.5 mg via ORAL
  Filled 2017-05-27: qty 1

## 2017-05-27 MED ORDER — CARVEDILOL 3.125 MG PO TABS
3.1250 mg | ORAL_TABLET | Freq: Two times a day (BID) | ORAL | Status: DC
  Administered 2017-05-28: 3.125 mg via ORAL
  Filled 2017-05-27: qty 1

## 2017-05-27 MED ORDER — ACETAMINOPHEN 500 MG PO TABS
500.0000 mg | ORAL_TABLET | Freq: Every day | ORAL | Status: DC
  Administered 2017-05-28: 500 mg via ORAL
  Filled 2017-05-27: qty 1

## 2017-05-27 MED ORDER — ACETAMINOPHEN 325 MG PO TABS: 650.0000 mg | ORAL_TABLET | ORAL | Status: DC | PRN

## 2017-05-27 MED ORDER — GI COCKTAIL ~~LOC~~: 30.0000 mL | Freq: Four times a day (QID) | ORAL | Status: DC | PRN

## 2017-05-27 MED ORDER — ASPIRIN EC 81 MG PO TBEC
81.0000 mg | DELAYED_RELEASE_TABLET | Freq: Every day | ORAL | Status: DC
  Filled 2017-05-27: qty 1

## 2017-05-27 MED ORDER — MORPHINE SULFATE (PF) 2 MG/ML IV SOLN: 2.0000 mg | INTRAVENOUS | Status: DC | PRN

## 2017-05-27 MED ORDER — LEVOTHYROXINE SODIUM 75 MCG PO TABS
75.0000 ug | ORAL_TABLET | Freq: Every day | ORAL | Status: DC
  Administered 2017-05-28: 75 ug via ORAL
  Filled 2017-05-27: qty 1

## 2017-05-27 MED ORDER — ONDANSETRON HCL 4 MG/2ML IJ SOLN: 4.0000 mg | Freq: Four times a day (QID) | INTRAMUSCULAR | Status: DC | PRN

## 2017-05-27 NOTE — ED Provider Notes (Signed)
[facility] DEPT Provider Note   CSN: [id number] Arrival date & time: 05/27/17  2007     History   Chief Complaint Chief Complaint  Patient presents with  . Chest Pain    HPI EASHAN SCHIPANI is a 83 y.o. male.  HPI  83 year old male, history of coronary disease, history of stage III chronic kidney disease, hypertension, myocardial infarction in 2014 and a history of rectal and renal cell and prostate cancer. The patient underwent coronary artery bypass grafting last year and has done very well. Since that time he has not had any significant amounts of chest pain however today he developed recurrent chest discomfort. He denies that it is a heaviness or pressure and he has significant difficulty describing what it feels like. He states it just does not feel right, it is uncomfortable, it is not associated with sweating, nausea, vomiting, shortness of breath, fevers or coughing. He reports chronic swelling of his lower extremities left greater than right secondary to prior vascular harvest, and chronic left knee pain for which she has been unable to obtain surgical consultation secondary to his other medical problems. Patient does not take any anticoagulants but has taken aspirin prior to my evaluation.  Past Medical History:  Diagnosis Date  . Anemia   . CAD (coronary artery disease)    RCA throbectomy 2014  . Chronic renal insufficiency, stage III (moderate)   . Esophageal stricture   . Hypertension   . Hypothyroidism   . Myocardial infarction ([place]) 03/12/13   Inferior MI.  LAD mid 80% stenosis, circ mild plaque, RCA 100% with thrombectomy PTCA.  EF 65%  . Osteoporosis   . PC (prostate cancer) ([place]) 1987  . Rectum cancer ([place]) 1986  . Renal cell cancer [facility])     Patient Active Problem List   Diagnosis Date Noted  . Chest pain 05/27/2017  . Coronary artery disease involving native coronary artery of native heart without angina pectoris 03/19/2017  . Dyslipidemia 03/19/2017   . S/P CABG x 3 12/08/2015  . Unstable angina ([place])   . Chest pain with moderate risk of acute coronary syndrome 12/05/2015  . Aortic stenosis, mild-2014 12/05/2015  . Anginal pain ([place]) 10/17/2015  . History of renal cell cancer-s/p nephrectomy 07/19/2013  . Hyperlipidemia 07/19/2013  . Hypotension arterial 03/18/2013  . Complete heart block ([place]) 03/18/2013  . Anemia   . CKD (chronic kidney disease) stage 3, GFR 30-59 ml/min 03/12/2013  . Acute MI, inferior wall, initial episode of care ([place]) 03/12/2013  . Impotence 03/12/2013  . History of renal stone 03/12/2013  . Hiatal hernia 03/12/2013  . H/O lithotripsy 03/12/2013  . SCCA (squamous cell carcinoma) of skin 03/12/2013  . S/P prostatectomy, radical 03/12/2013  . Left renal mass 03/12/2013  . Arthritis 03/12/2013  . History of STEMI- May 2014 03/12/2013  . CAD S/P RCA thrombectomy May 2014- residual LAD disease then 03/12/2013  . Cardiogenic shock ([place]) 03/12/2013  . Acute respiratory failure ([place]) 03/12/2013  . Altered mental status 03/12/2013  . Essential hypertension, benign 01/14/2013  . Colostomy in place [facility]) 01/14/2013  . Prostate cancer ([place]) 01/14/2013  . Renal cell carcinoma, Right 01/14/2013  . Osteoporosis, unspecified 01/14/2013    Past Surgical History:  Procedure Laterality Date  . CARDIAC CATHETERIZATION N/A 12/06/2015   Procedure: Left Heart Cath and Coronary Angiography;  Surgeon: Burnell Blanks, MD;  Location: [place] [place] CV LAB;  Service: Cardiovascular;  Laterality: N/A;  . COLONOSCOPY  01/08/2012   Procedure: COLONOSCOPY;  Surgeon: Rogene Houston, MD;  [facility];  Service: Endoscopy;  Laterality: N/A;  1030  . COLONOSCOPY N/A 07/20/2015   Procedure: COLONOSCOPY;  Surgeon: Rogene Houston, MD;  [facility];  Service: Endoscopy;  Laterality: N/A;  930 - moved to 9/29 @ 2:00  . COLOSTOMY Left 1986   colon cancer  . CORONARY ARTERY BYPASS GRAFT N/A 12/08/2015    Procedure: CORONARY ARTERY BYPASS GRAFTING (CABG);  Surgeon: Melrose Nakayama, MD;  Location: [place];  Service: Open Heart Surgery;  Laterality: N/A;  Times 3 using left internal mammary artery and endoscopycally harvested bilateral saphenous vein.  Marland Kitchen HERNIA REPAIR     2007  . KIDNEY SURGERY  2004  . LEFT HEART CATHETERIZATION WITH CORONARY ANGIOGRAM N/A 03/12/2013   Procedure: STEMI;  Surgeon: Burnell Blanks, MD;  Location: [facility] CATH LAB;  Service: Cardiovascular;  Laterality: N/A;  . PERCUTANEOUS CORONARY INTERVENTION-BALLOON ONLY    . PROSTATECTOMY    . [place]  2006  . renal sphincter appliance  2007  . Right inguinal hernia repair    . TEE WITHOUT CARDIOVERSION N/A 12/08/2015   Procedure: TRANSESOPHAGEAL ECHOCARDIOGRAM (TEE);  Surgeon: Melrose Nakayama, MD;  Location: [place];  Service: Open Heart Surgery;  Laterality: N/A;       Home Medications    Prior to Admission medications   Medication Sig Start Date End Date Taking? Authorizing Provider  acetaminophen (TYLENOL) 500 MG tablet Take 500 mg by mouth at bedtime.   Yes [provider]  amLODipine (NORVASC) 2.5 MG tablet Take 1 tablet (2.5 mg total) by mouth daily. 04/21/17  Yes Minus Breeding, MD  aspirin 81 MG tablet Take 1 tablet (81 mg total) by mouth daily. Patient taking differently: Take 81 mg by mouth every evening.  03/18/13  Yes Barrett, Evelene Croon, PA-C  carvedilol (COREG) 3.125 MG tablet Take 1 tablet (3.125 mg total) by mouth 2 (two) times daily with a meal. 07/01/16  Yes Chipper Herb, MD  furosemide (LASIX) 20 MG tablet Take 1 tablet daily as needed. Patient taking differently: Take one tablet by mouth once every other day 04/07/17  Yes Hochrein, Jeneen Rinks, MD  levothyroxine (SYNTHROID, LEVOTHROID) 75 MCG tablet Take 75 mcg by mouth daily before breakfast.   Yes [provider]  nitroGLYCERIN (NITROSTAT) 0.4 MG SL tablet PLACE 1 TABLET UNDER THE TONGUE AT ONSET OF CHEST PAIN  EVERY 5 MINTUES UP TO 3 TIMES AS NEEDED 03/12/16  Yes Chipper Herb, MD    Family History Family History  Problem Relation Age of Onset  . Stroke Mother 12  . AAA (abdominal aortic aneurysm) Father 30       died from rupture at 33 yo    Social History Social History  Substance Use Topics  . Smoking status: Former Smoker    Packs/day: 0.50    Years: 10.00    Types: Cigarettes    Start date: 10/21/1946    Quit date: 10/21/1968  . Smokeless tobacco: Never Used  . Alcohol use 0.5 oz/week    1 Standard drinks or equivalent per week     Comment: rare, wine     Allergies   Lipitor [atorvastatin]   Review of Systems Review of Systems  All other systems reviewed and are negative.    Physical Exam Updated Vital Signs BP 120/83   Pulse 63   Temp 97.9 F (36.6 C) (Oral)   Resp 15   Ht 5'  6" (1.676 m)   Wt 73.9 kg (163 lb)   SpO2 98%   BMI 26.31 kg/m   Physical Exam  Constitutional: He appears well-developed and well-nourished. No distress.  HENT:  Head: Normocephalic and atraumatic.  Mouth/Throat: Oropharynx is clear and moist. No oropharyngeal exudate.  Eyes: Pupils are equal, round, and reactive to light. Conjunctivae and EOM are normal. Right eye exhibits no discharge. Left eye exhibits no discharge. No scleral icterus.  Neck: Normal range of motion. Neck supple. No JVD present. No thyromegaly present.  Cardiovascular: Normal rate, regular rhythm and intact distal pulses.  Exam reveals no gallop and no friction rub.   Murmur ( 3/6 systolic holosystolic murmur) heard. Pulmonary/Chest: Effort normal and breath sounds normal. No respiratory distress. He has no wheezes. He has no rales.  Abdominal: Soft. Bowel sounds are normal. He exhibits no distension and no mass. There is no tenderness.  Musculoskeletal: Normal range of motion. He exhibits edema ( Left greater than right lower extremity pitting edema below the knees). He exhibits no tenderness.  Lymphadenopathy:     He has no cervical adenopathy.  Neurological: He is alert. Coordination normal.  Skin: Skin is warm and dry. No rash noted. No erythema.  Psychiatric: He has a normal mood and affect. His behavior is normal.  Nursing note and vitals reviewed.    ED Treatments / Results  Labs (all labs ordered are listed, but only abnormal results are displayed) Labs Reviewed  BASIC METABOLIC PANEL - Abnormal; Notable for the following:       Result Value   Glucose, Bld 103 (*)    BUN 35 (*)    Creatinine, Ser 1.56 (*)    GFR calc non Af Amer 37 (*)    GFR calc Af Amer 43 (*)    All other components within normal limits  CBC - Abnormal; Notable for the following:    RBC 4.08 (*)    Hemoglobin 11.0 (*)    HCT 34.9 (*)    All other components within normal limits  I-STAT TROPONIN, ED    EKG  EKG Interpretation  Date/Time:  Tuesday May 27 2017 20:13:43 EDT Ventricular Rate:  68 PR Interval:    QRS Duration: 148 QT Interval:  425 QTC Calculation: 452 R Axis:   -35 Text Interpretation:  Sinus rhythm Prolonged PR interval Right bundle branch block since last tracing no significant change Confirmed by Noemi Chapel [phone]) on 05/27/2017 8:35:23 PM       Radiology Dg Chest 2 View  Result Date: 05/27/2017 CLINICAL DATA:  Shortness of Breath EXAM: CHEST  2 VIEW COMPARISON:  October 18, 2016 FINDINGS: There is left base atelectasis. No edema or consolidation. There is cardiomegaly with pulmonary vascularity within normal limits. No adenopathy. Patient is status post coronary artery bypass grafting. There is a sizable to hiatal type hernia. No evident bone lesions. IMPRESSION: Left base atelectasis. No edema or consolidation. Stable cardiomegaly. Sizable hiatal type hernia. Electronically Signed   By: Lowella Grip III [name]   On: 05/27/2017 21:05    Procedures Procedures (including critical care time)  Medications Ordered in ED Medications - No data to display   Initial Impression /  Assessment and Plan / ED Course  I have reviewed the triage vital signs and the nursing notes.  Pertinent labs & imaging results that were available during my care of the patient were reviewed by me and considered in my medical decision making (see chart for details).  The patient appears to have mild symptoms but it is persistent and with his significant history I am concerned for coronary syndrome. EKG shows a right bundle branch block, no signs of ST elevation. We'll pursue chest x-ray troponin and likely admission for observation given high risk.  Trop neg Labs show CRI - no acute findings Pt needs admit D/w Dr. Shanon Brow who is agreeable to admit  Final Clinical Impressions(s) / ED Diagnoses   Final diagnoses:  Precordial chest pain    New Prescriptions New Prescriptions   No medications on file     Noemi Chapel, MD 05/27/17 2152

## 2017-05-27 NOTE — ED Triage Notes (Signed)
Pt c/o mid sternal chest pain that started x 1 hour ago; pt describes the pain as dull and non radiating; pt had 3 nitroglycerin tablets and 324mg  of ASA en route by ems; pt denies any pain at this time

## 2017-05-27 NOTE — ED Notes (Signed)
Report given to Heather RN on 300. 

## 2017-05-27 NOTE — H&P (Signed)
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  Pt reports he has been mildly sob for several weeks.  No fevers.  No cough.  No le edema or swelling.  He discribed the event as "pressure" not pain, just something didn't feel right.  Did not feel like his previous heart attacks.  He was given aspirin and ntg sl and his symptoms totally resolved soon after arrival in the ED.  He has no h/o anginal symptoms since he had his cabg.  He is statin intolerant.  Pt referred for admission for possible ACS.   Review of Systems: As per HPI otherwise 10 point review of systems negative.   Past Medical History:  Diagnosis Date  . Anemia   . CAD (coronary artery disease)    RCA throbectomy 2014  . Chronic renal insufficiency, stage III (moderate)   . Esophageal stricture   . Hypertension   . Hypothyroidism   . Myocardial infarction ([facility]) 03/12/13   Inferior MI.  LAD mid 80% stenosis, circ mild plaque, RCA 100% with thrombectomy PTCA.  EF 65%  . Osteoporosis   . PC (prostate cancer) ([facility]) 1987  . Rectum cancer ([facility]) 1986  . Renal cell cancer [facility])     Past Surgical History:  Procedure Laterality Date  . CARDIAC CATHETERIZATION N/A 12/06/2015   Procedure: Left Heart Cath and Coronary Angiography;  Surgeon: Kathleene Hazel, MD;  Location: [facility] INVASIVE CV LAB;  Service: Cardiovascular;  Laterality: N/A;  . COLONOSCOPY  01/08/2012   Procedure: COLONOSCOPY;  Surgeon: Malissa Hippo, MD;  [facility];  Service: Endoscopy;  Laterality: N/A;  1030  . COLONOSCOPY N/A 07/20/2015   Procedure: COLONOSCOPY;  Surgeon: Malissa Hippo, MD;  [facility];  Service: Endoscopy;  Laterality: N/A;  930 - moved to 9/29 @ 2:00  . COLOSTOMY Left 1986   colon cancer  . CORONARY ARTERY BYPASS GRAFT N/A 12/08/2015   Procedure: CORONARY ARTERY BYPASS GRAFTING (CABG);  Surgeon: Loreli Slot, MD;  Location: [facility] OR;  Service: Open Heart Surgery;  Laterality: N/A;  Times 3 using left internal mammary artery and endoscopycally harvested bilateral saphenous vein.  Marland Kitchen HERNIA REPAIR     2007  . KIDNEY SURGERY  2004  . LEFT HEART CATHETERIZATION WITH CORONARY ANGIOGRAM N/A 03/12/2013   Procedure: STEMI;  Surgeon: Kathleene Hazel, MD;  Location: [facility] CATH LAB;  Service: Cardiovascular;  Laterality: N/A;  . PERCUTANEOUS CORONARY INTERVENTION-BALLOON ONLY    . PROSTATECTOMY    . RADIOFREQUENCY ABLATION KIDNEY  2006  . renal sphincter appliance  2007  . Right inguinal hernia repair    . TEE WITHOUT CARDIOVERSION N/A 12/08/2015   Procedure: TRANSESOPHAGEAL ECHOCARDIOGRAM (TEE);  Surgeon: Loreli Slot, MD;  Location: [facility] OR;  Service: Open Heart Surgery;  Laterality: N/A;     reports that he quit smoking about 48 years ago. His smoking use included Cigarettes. He started smoking about 70 years ago. He has a 5.00 pack-year smoking history. He has never used smokeless tobacco. He reports that he drinks about 0.5 oz of alcohol per week .  He reports that he does not use drugs.  Allergies  Allergen Reactions  . Lipitor [Atorvastatin] Other (See Comments)    Muscle aches    Family History  Problem Relation Age of Onset  . Stroke Mother 11  . AAA (abdominal aortic aneurysm) Father 52       died from rupture at 52 yo    Prior to Admission medications   Medication Sig Start Date End Date Taking? Authorizing Provider  acetaminophen (TYLENOL) 500 MG tablet Take 500 mg by mouth at bedtime.   Yes [provider]  amLODipine (NORVASC) 2.5 MG tablet Take 1 tablet (2.5 mg total) by mouth daily. 04/21/17  Yes Rollene Rotunda, MD    aspirin 81 MG tablet Take 1 tablet (81 mg total) by mouth daily. Patient taking differently: Take 81 mg by mouth every evening.  03/18/13  Yes Barrett, Joline Salt, PA-C  carvedilol (COREG) 3.125 MG tablet Take 1 tablet (3.125 mg total) by mouth 2 (two) times daily with a meal. 07/01/16  Yes Ernestina Penna, MD  furosemide (LASIX) 20 MG tablet Take 1 tablet daily as needed. Patient taking differently: Take one tablet by mouth once every other day 04/07/17  Yes Rollene Rotunda, MD  levothyroxine (SYNTHROID, LEVOTHROID) 75 MCG tablet Take 75 mcg by mouth daily before breakfast.   Yes [provider]  nitroGLYCERIN (NITROSTAT) 0.4 MG SL tablet PLACE 1 TABLET UNDER THE TONGUE AT ONSET OF CHEST PAIN EVERY 5 MINTUES UP TO 3 TIMES AS NEEDED 03/12/16  Yes Ernestina Penna, MD    Physical Exam: Vitals:   05/27/17 2014 05/27/17 2030 05/27/17 2100 05/27/17 2130  BP: (!) 130/93 120/82 125/81 120/83  Pulse: 71 62 66 63  Resp: 20 18 18 15   Temp: 97.9 F (36.6 C)     TempSrc: Oral     SpO2: 96% 96% 96% 98%  Weight:      Height:        Constitutional: NAD, calm, comfortable Vitals:   05/27/17 2014 05/27/17 2030 05/27/17 2100 05/27/17 2130  BP: (!) 130/93 120/82 125/81 120/83  Pulse: 71 62 66 63  Resp: 20 18 18 15   Temp: 97.9 F (36.6 C)     TempSrc: Oral     SpO2: 96% 96% 96% 98%  Weight:      Height:       Eyes: PERRL, lids and conjunctivae normal ENMT: Mucous membranes are moist. Posterior pharynx clear of any exudate or lesions.Normal dentition.  Neck: normal, supple, no masses, no thyromegaly Respiratory: clear to auscultation bilaterally, no wheezing, no crackles. Normal respiratory effort. No accessory muscle use.  Cardiovascular: Regular rate and rhythm, no murmurs / rubs / gallops. No extremity edema. 2+ pedal pulses. No carotid bruits.  Abdomen: no tenderness, no masses palpated. No hepatosplenomegaly. Bowel sounds positive.  Musculoskeletal: no clubbing / cyanosis. No joint  deformity upper and lower extremities. Good ROM, no contractures. Normal muscle tone.  Skin: no rashes, lesions, ulcers. No induration Neurologic: CN 2-12 grossly intact. Sensation intact, DTR normal. Strength 5/5 in all 4.  Psychiatric: Normal judgment and insight. Alert and oriented x 3. Normal mood.    Labs on Admission: I have personally reviewed following labs and imaging studies  CBC:  Recent Labs Lab 05/27/17 2053  WBC 7.3  HGB 11.0*  HCT 34.9*  MCV 85.5  PLT 207   Basic Metabolic Panel:  Recent Labs Lab 05/27/17 2053  NA 136  K 4.3  CL 105  CO2 22  GLUCOSE 103*  BUN 35*  CREATININE 1.56*  CALCIUM 9.6   GFR: Estimated Creatinine Clearance: 27.3 mL/min (A) (by C-G formula based on SCr of 1.56 mg/dL (H)).   Radiological Exams on Admission: Dg Chest 2 View  Result Date: 05/27/2017 CLINICAL DATA:  Shortness of Breath EXAM: CHEST  2 VIEW COMPARISON:  October 18, 2016 FINDINGS: There is left base atelectasis. No edema or consolidation. There is cardiomegaly with pulmonary vascularity within normal limits. No adenopathy. Patient is status post coronary artery bypass grafting. There is a sizable to hiatal type hernia. No evident bone lesions. IMPRESSION: Left base atelectasis. No edema or consolidation. Stable cardiomegaly. Sizable hiatal type hernia. Electronically Signed   By: Bretta Bang III [name]   On: 05/27/2017 21:05    EKG: Independently reviewed. nsr rbbb no change from old Old chart reviewed Case discussed with dr Hyacinth Meeker cxr reviewed no edema or infiltrate  Assessment/Plan 83 yo male h/o CAD/CABG comes in with atypical chest pain  Principal Problem:   Chest pain- ekg no changes from prior.  Initial trop neg.  Serial trop.  Obtain cardiac echo in am.  Also obtain cardiology consult in am to determine further work up at this time.  Pt advised to let us know if his symptoms return, if so place ntg paste.  Active Problems:   Essential hypertension,  benign- stable   CKD (chronic kidney disease) stage 3, GFR 30-59 ml/min- stable at baseline   CAD S/P RCA thrombectomy May 2014- residual LAD disease then- as above   History of renal cell cancer-s/p nephrectomy- noted   S/P CABG x 3- noted     DVT prophylaxis: scds  Code Status:  full Family Communication: wife  Disposition Plan:  Per day team Consults called:  cardiology Admission status:  observation   Allyne Hebert A MD [facility]  If 7PM-7AM, please contact night-coverage [email or web address] Password [email or web address]  05/27/2017, 11:13 PM

## 2017-05-28 ENCOUNTER — Observation Stay (HOSPITAL_BASED_OUTPATIENT_CLINIC_OR_DEPARTMENT_OTHER): Payer: Medicare Other

## 2017-05-28 ENCOUNTER — Encounter (HOSPITAL_COMMUNITY): Payer: Self-pay | Admitting: Physician Assistant

## 2017-05-28 DIAGNOSIS — I340 Nonrheumatic mitral (valve) insufficiency: Secondary | ICD-10-CM | POA: Diagnosis not present

## 2017-05-28 DIAGNOSIS — I350 Nonrheumatic aortic (valve) stenosis: Secondary | ICD-10-CM

## 2017-05-28 DIAGNOSIS — R072 Precordial pain: Secondary | ICD-10-CM | POA: Diagnosis not present

## 2017-05-28 DIAGNOSIS — R079 Chest pain, unspecified: Secondary | ICD-10-CM

## 2017-05-28 LAB — ECHOCARDIOGRAM COMPLETE
Height: 66 in
Weight: 2676.8 oz

## 2017-05-28 LAB — TROPONIN I
Troponin I: <0.03 ng/mL (ref <0.03)
Troponin I: <0.03 ng/mL (ref <0.03)

## 2017-05-28 MED ORDER — ENSURE ENLIVE PO LIQD: 237.0000 mL | Freq: Two times a day (BID) | ORAL | Status: DC

## 2017-05-28 NOTE — Progress Notes (Signed)
*  PRELIMINARY RESULTS* Echocardiogram 2D Echocardiogram has been performed.  Leavy Cella 05/28/2017, 11:58 AM

## 2017-05-28 NOTE — Consult Note (Signed)
Cardiology Consultation:   Patient ID: Frederick Davis; [id number]; 1923/10/16   Admit date: 05/27/2017 Date of Consult: 05/28/2017  Primary Care Provider: Chipper Herb, MD Primary Cardiologist: Dr. 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 Shanon Brow.  History of Present Illness:   Mr. Thomas Hoff reports for the past several months he's noticed mild DOE but has also slowed down his activity quite a bit. He does still exercise on a recumbent bike every week or so, and was able to do so this past week without any functional limitation. He was in his USOH yesterday and went up to the [employer] to see his wife who was volunteering there. There were extra sub sandwiches so he partook in one. Within an hour he felt a sensation of vague chest discomfort. I was "hardly there" and extremely mild so he has a very hard time describing it. He states it's not pain, pressure or sharp - he just "felt like something was different." He did not get SOB, diaphoretic, nauseated or dizzy. No palpitations. He did not try SL NTG. He told his wife they ought to call EMS to be on the safe side. He received 4 baby aspirin and SL NTG which did not really change the discomfort. It eventually went away on its own spontaneously after several hours. He reports he was talking to his wife and suddenly he realized it was gone. This is unlike prior angina.  Labs notable for troponin neg x 3, Hgb 11  (previous baseline 11.8) and Cr 1.56 (c/w prior). He is not tachycardic, tachypneic or hypoxic. CXR with left base atx, no edema or consolidation.  He states he's been told he's had a murmur for a long time - previously documented as 3/6 on prior [facility] notes.  Past Medical History:  Diagnosis Date  . Anemia   . Aortic stenosis    a. mild-mod by TEE 11/2015.  Marland Kitchen CAD (coronary artery disease)    a. RCA thrombectomy 2014. b. CABGx3 in 11/2015 (EF 45-50% in 11/2015).  . Chronic renal insufficiency, stage III (moderate)   . Esophageal stricture   . Hypertension   . Hypothyroidism   . Mitral regurgitation    a. mild by TEE 11/2015.  Marland Kitchen Myocardial infarction ([place]) 03/12/13   Inferior MI.  LAD mid 80% stenosis, circ mild plaque, RCA 100% with thrombectomy PTCA.  EF 65%  . Osteoporosis   . PC (prostate cancer) ([place]) 1987  . RBBB   . Rectum cancer ([place]) 1986  . Renal cell cancer [facility])     Past Surgical History:  Procedure Laterality Date  . CARDIAC CATHETERIZATION N/A 12/06/2015   Procedure: Left Heart Cath and Coronary Angiography;  Surgeon: Burnell Blanks, MD;  Location: [place] CV LAB;  Service: Cardiovascular;  Laterality: N/A;  . COLONOSCOPY  01/08/2012   Procedure: COLONOSCOPY;  Surgeon: Rogene Houston, MD;  [facility];  Service: Endoscopy;  Laterality: N/A;  1030  . COLONOSCOPY N/A 07/20/2015  Procedure: COLONOSCOPY;  Surgeon: Rogene Houston, MD;  [facility];  Service: Endoscopy;  Laterality: N/A;  930 - moved to 9/29 @ 2:00  . COLOSTOMY Left 1986   colon cancer  . CORONARY ARTERY BYPASS GRAFT N/A 12/08/2015   Procedure: CORONARY ARTERY BYPASS GRAFTING (CABG);  Surgeon: Melrose Nakayama, MD;  Location: [place];  Service: Open Heart Surgery;  Laterality: N/A;  Times 3 using left internal mammary artery and endoscopycally harvested bilateral saphenous vein.  Marland Kitchen HERNIA REPAIR     2007  . KIDNEY SURGERY  2004  . LEFT HEART CATHETERIZATION WITH  CORONARY ANGIOGRAM N/A 03/12/2013   Procedure: STEMI;  Surgeon: Burnell Blanks, MD;  Location: [facility] CATH LAB;  Service: Cardiovascular;  Laterality: N/A;  . PERCUTANEOUS CORONARY INTERVENTION-BALLOON ONLY    . PROSTATECTOMY    . [place]  2006  . renal sphincter appliance  2007  . Right inguinal hernia repair    . TEE WITHOUT CARDIOVERSION N/A 12/08/2015   Procedure: TRANSESOPHAGEAL ECHOCARDIOGRAM (TEE);  Surgeon: Melrose Nakayama, MD;  Location: [place];  Service: Open Heart Surgery;  Laterality: N/A;     Inpatient Medications: Scheduled Meds: . acetaminophen  500 mg Oral QHS  . amLODipine  2.5 mg Oral Daily  . aspirin EC  81 mg Oral Daily  . carvedilol  3.125 mg Oral BID WC  . feeding supplement (ENSURE ENLIVE)  237 mL Oral BID BM  . levothyroxine  75 mcg Oral QAC breakfast   Continuous Infusions:  PRN Meds: acetaminophen, gi cocktail, morphine injection, ondansetron (ZOFRAN) IV  Allergies:    Allergies  Allergen Reactions  . Lipitor [Atorvastatin] Other (See Comments)    Muscle aches    Social History:   Social History   Social History  . Marital status: Married    Spouse name: Mikle Bosworth  . Number of children: N/A  . Years of education: N/A   Occupational History  . pharmacist-retired   . preacher-retired    Social History Main Topics  . Smoking status: Former Smoker    Packs/day: 0.50    Years: 10.00    Types: Cigarettes    Start date: 10/21/1946    Quit date: 10/21/1968  . Smokeless tobacco: Never Used  . Alcohol use 0.5 oz/week    1 Standard drinks or equivalent per week     Comment: rare, Bailey's - less than once a week  . Drug use: No  . Sexual activity: Not on file   Other Topics Concern  . Not on file   Social History Narrative  . No narrative on file    Family History:   The patient's family history includes AAA (abdominal aortic aneurysm) (age of onset: 39) in his father; Stroke (age of onset: 70) in his mother.    ROS:  Please see the history of present illness. all other ROS reviewed and negative.     Physical Exam/Data:   Vitals:   05/27/17 2030 05/27/17 2100 05/27/17 2130 05/28/17 0030  BP: 120/82 125/81 120/83 137/88  Pulse: 62 66 63 61  Resp: 18 18 15 18   Temp:    (!) 97.4 F (36.3 C)  TempSrc:    Oral  SpO2: 96% 96% 98% 95%  Weight:    167 lb 4.8 oz (75.9 kg)  Height:       No intake or output data in the 24 hours ending 05/28/17 0819 Filed Weights   05/27/17 2011 05/28/17 0030  Weight: 163  lb (73.9 kg) 167 lb 4.8 oz (75.9 kg)   Body mass index is 27 kg/m.  General: Well developed, well nourished WM, in no acute distress. Head: Normocephalic, atraumatic, sclera non-icteric, no xanthomas, nares are without discharge.  Neck: Negative for carotid bruits. JVD not elevated. Lungs: Clear bilaterally to auscultation without wheezes, rales, or rhonchi. Breathing is unlabored. Heart: RRR with S1 S2. 3/6 SEM at LSB, less so at RUSB, no rubs, or gallops appreciated. Abdomen: Soft, non-tender, non-distended with normoactive bowel sounds. No hepatomegaly. No rebound/guarding. No obvious abdominal masses. Msk:  Strength and tone appear normal for age. Extremities: No clubbing or cyanosis. No edema.  Distal pedal pulses are 2+ and equal bilaterally. Neuro: Alert and oriented X 3. No facial asymmetry. Mild cognitive delay but otherwise no focal deficit. Moves all extremities spontaneously. Psych:  Responds to questions appropriately with a normal affect.  EKG:  The EKG was personally reviewed and demonstrates NSR 68bpm, first degree AVB, RBBB, no sig change from prior.  Relevant CV Studies: TEE 11/2015 Study Conclusions  - Left ventricle: Systolic function was mildly reduced. The   estimated ejection fraction was in the range of 45% to 50%. - Aortic valve: Moderately calcified annulus. Trileaflet;   moderately thickened leaflets. There was mild to moderate   stenosis. - Mitral valve:  There was mild regurgitation. - Left atrium: The atrium was dilated. No evidence of thrombus in   the atrial cavity or appendage. - Atrial septum: A septal defect cannot be excluded. A patent   foramen ovale cannot be excluded. - Tricuspid valve: No evidence of vegetation. - Pulmonic valve: No evidence of vegetation.   Laboratory Data:  Chemistry Recent Labs Lab 05/27/17 2053  NA 136  K 4.3  CL 105  CO2 22  GLUCOSE 103*  BUN 35*  CREATININE 1.56*  CALCIUM 9.6  GFRNONAA 37*  GFRAA 43*  ANIONGAP 9     Hematology Recent Labs Lab 05/27/17 2053  WBC 7.3  RBC 4.08*  HGB 11.0*  HCT 34.9*  MCV 85.5  MCH 27.0  MCHC 31.5  RDW 14.0  PLT 207   Cardiac Enzymes Recent Labs Lab 05/27/17 2358 05/28/17 0332 05/28/17 0710  TROPONINI <0.03 <0.03 <0.03    Recent Labs Lab 05/27/17 2107  TROPIPOC 0.00    BNPNo results for input(s): BNP, PROBNP in the last 168 hours.  DDimer No results for input(s): DDIMER in the last 168 hours.  Radiology/Studies:  Dg Chest 2 View  Result Date: 05/27/2017 CLINICAL DATA:  Shortness of Breath EXAM: CHEST  2 VIEW COMPARISON:  October 18, 2016 FINDINGS: There is left base atelectasis. No edema or consolidation. There is cardiomegaly with pulmonary vascularity within normal limits. No adenopathy. Patient is status post coronary artery bypass grafting. There is a sizable to hiatal type hernia. No evident bone lesions. IMPRESSION: Left base atelectasis. No edema or consolidation. Stable cardiomegaly. Sizable hiatal type hernia. Electronically Signed   By: Lowella Grip III [name]   On: 05/27/2017 21:05    Assessment and Plan:   1. Chest discomfort - atypical, no pain/pressure, unlike prior angina, very mild. Despite several hours of symptoms, troponins are negative. I favor conservative observation for recurrent symptoms and continuing current regimen. Will review with MD.  2. CAD - continue aspirin, BB, amlodipine. Would not accelerate dose  of BB at this time due to 1st degree AVB/RBBB and sinus bradycardia (HR 50s-60s). He's been intolerant of atorvastatin in the past and primary cardiologist has elected not  to press the issue further.  3. Systolic murmur with known mild-mod aortic stenosis, mild mitral regurgitation - being followed clinically for now. Repeat echo is ordered but if this is delayed I'm not sure he needs to stay in the hospital for this, could possibly be done as OP. Will review with Dr. Harl Bowie.  4. CKD stage III - appears stable.  Signed, Charlie Pitter, PA-C  05/28/2017 8:19 AM   Attending note Patient seen and discussed with PA Dunn, I agree with her documentation above. 83 yo male with PMH as outlined above admitted with atypical mild chest pain. Troponins are negative, EKG without specific ischemic changes. In setting of mild aytpical symptoms, advanced age, and renal dysfunction would not pursue ischemic testing at this time. He can be reevaluated as outpatient regarding his symnptoms. We will f/u echo results here today, unless severe change would be ok for discharge today.   Carlyle Dolly DM

## 2017-05-28 NOTE — Progress Notes (Signed)
Pt had 1.9 second pause on telemetry monitor. MD made aware. Pt and vitals stable, will continue to monitor.

## 2017-05-28 NOTE — Discharge Summary (Signed)
Physician Discharge Summary  Shrihan Putt [facility] ENI:[id number] DOB: Nov 23, 1922 DOA: 05/27/2017  PCP: Chipper Herb, MD  Admit date: 05/27/2017 Discharge date: 05/28/2017  Time spent: 45 minutes  Recommendations for Outpatient Follow-up:  -Patient will be discharged home today. -Outpatient follow-up with cardiology has been arranged.   Discharge Diagnoses:  Principal Problem:   Chest pain Active Problems:   Essential hypertension, benign   CKD (chronic kidney disease) stage 3, GFR 30-59 ml/min   CAD S/P RCA thrombectomy May 2014, CABG 11/2015   History of renal cell cancer-s/p nephrectomy   S/P CABG x 3   Aortic stenosis   Mitral regurgitation   Discharge Condition: Stable and improved  Filed Weights   05/27/17 2011 05/28/17 0030  Weight: 73.9 kg (163 lb) 75.9 kg (167 lb 4.8 oz)    History of present illness:  As per Dr. 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  Pt reports he has been mildly sob for several weeks.  No fevers.  No cough.  No le edema or swelling.  He discribed the event as "pressure" not pain, just something didn't feel right.  Did not feel like his previous heart attacks.  He was given aspirin and ntg sl and his symptoms totally resolved soon after arrival in the ED.  He has no h/o anginal symptoms since he had his cabg.  He is statin intolerant.  Pt referred for admission for possible ACS.  Hospital Course:   Chest pain -Atypical in nature, despite several hours of symptoms, troponins remain negative. -Seen by cardiology, they have decided that in the setting of mild atypical symptoms, advanced age and renal dysfunction that they would not pursue ischemic testing at this time. -Echo has been ordered and is pending. -As per cardiology okay to discharge home  today.  Rest of chronic medical conditions have been stable during this less than 12 hour hospitalization.   Procedures:  None   Consultations:  Cardiology, Dr. Harl Bowie  Discharge Instructions  Discharge Instructions    Diet - low sodium heart healthy    Complete by:  As directed    Increase activity slowly    Complete by:  As directed      Allergies as of 05/28/2017      Reactions   Lipitor [atorvastatin] Other (See Comments)   Muscle aches      Medication List    TAKE these medications   acetaminophen 500 MG tablet Commonly known as:  TYLENOL Take 500 mg by mouth at bedtime.   amLODipine 2.5 MG tablet Commonly known as:  NORVASC Take 1 tablet (2.5 mg total) by mouth daily.   aspirin 81 MG tablet Take 1 tablet (81 mg total) by mouth daily. What changed:  when to take this   carvedilol 3.125 MG tablet Commonly known as:  COREG Take 1 tablet (3.125 mg total) by mouth 2 (two) times daily with a meal.   furosemide 20 MG tablet Commonly known as:  LASIX Take 1 tablet daily as needed. What changed:  See the new instructions.   levothyroxine 75 MCG tablet Commonly known as:  SYNTHROID, LEVOTHROID Take 75 mcg by mouth daily before breakfast.   nitroGLYCERIN 0.4 MG SL tablet Commonly known as:  NITROSTAT PLACE 1 TABLET UNDER THE TONGUE AT  ONSET OF CHEST PAIN EVERY 5 MINTUES UP TO 3 TIMES AS NEEDED      Allergies  Allergen Reactions  . Lipitor [Atorvastatin] Other (See Comments)    Muscle aches   Follow-up Information    Minus Breeding, MD Follow up.   Specialty:  Cardiology Why:  Office will call you to schedule your follow-up appointment.  Contact information: [address] [place] [address] [phone]            The results of significant diagnostics from this hospitalization (including imaging, microbiology, ancillary and laboratory) are listed below for reference.    Significant Diagnostic Studies: Dg Chest 2 View  Result Date:  05/27/2017 CLINICAL DATA:  Shortness of Breath EXAM: CHEST  2 VIEW COMPARISON:  October 18, 2016 FINDINGS: There is left base atelectasis. No edema or consolidation. There is cardiomegaly with pulmonary vascularity within normal limits. No adenopathy. Patient is status post coronary artery bypass grafting. There is a sizable to hiatal type hernia. No evident bone lesions. IMPRESSION: Left base atelectasis. No edema or consolidation. Stable cardiomegaly. Sizable hiatal type hernia. Electronically Signed   By: Lowella Grip III [name]   On: 05/27/2017 21:05    Microbiology: No results found for this or any previous visit (from the past 240 hour(s)).   Labs: Basic Metabolic Panel:  Recent Labs Lab 05/27/17 2053  NA 136  K 4.3  CL 105  CO2 22  GLUCOSE 103*  BUN 35*  CREATININE 1.56*  CALCIUM 9.6   Liver Function Tests: No results for input(s): AST, ALT, ALKPHOS, BILITOT, PROT, ALBUMIN in the last 168 hours. No results for input(s): LIPASE, AMYLASE in the last 168 hours. No results for input(s): AMMONIA in the last 168 hours. CBC:  Recent Labs Lab 05/27/17 2053  WBC 7.3  HGB 11.0*  HCT 34.9*  MCV 85.5  PLT 207   Cardiac Enzymes:  Recent Labs Lab 05/27/17 2358 05/28/17 0332 05/28/17 0710  TROPONINI <0.03 <0.03 <0.03   BNP: BNP (last 3 results) No results for input(s): BNP in the last 8760 hours.  ProBNP (last 3 results) No results for input(s): PROBNP in the last 8760 hours.  CBG: No results for input(s): GLUCAP in the last 168 hours.     SignedLelon Frohlich  [facility] Pager: [phone] 05/28/2017, 1:49 PM

## 2017-05-28 NOTE — Care Management Obs Status (Signed)
[place] NOTIFICATION   Patient Details  Name: Frederick Davis MRN: [id number] Date of Birth: Feb 12, 1923   Medicare Observation Status Notification Given:  No (discharged within 24 hours)    Kayce Chismar, Chauncey Reading, RN 05/28/2017, 2:19 PM

## 2017-05-28 NOTE — Progress Notes (Signed)
Could not get through on phone line so I have sent a message to our scheduler requesting a follow-up appointment, and our office will call the patient with this information.Evon Dejarnett PA-C

## 2017-05-29 ENCOUNTER — Ambulatory Visit (INDEPENDENT_AMBULATORY_CARE_PROVIDER_SITE_OTHER): Payer: Medicare Other | Admitting: Family Medicine

## 2017-05-29 ENCOUNTER — Encounter: Payer: Self-pay | Admitting: Family Medicine

## 2017-05-29 VITALS — BP 122/77 | HR 61 | Temp 97.0°F | Ht 66.0 in | Wt 166.0 lb

## 2017-05-29 DIAGNOSIS — R300 Dysuria: Secondary | ICD-10-CM | POA: Diagnosis not present

## 2017-05-29 DIAGNOSIS — I2510 Atherosclerotic heart disease of native coronary artery without angina pectoris: Secondary | ICD-10-CM | POA: Diagnosis not present

## 2017-05-29 DIAGNOSIS — N5082 Scrotal pain: Secondary | ICD-10-CM | POA: Diagnosis not present

## 2017-05-29 DIAGNOSIS — N5089 Other specified disorders of the male genital organs: Secondary | ICD-10-CM | POA: Diagnosis not present

## 2017-05-29 LAB — URINALYSIS, COMPLETE
BILIRUBIN UA: NEGATIVE
Glucose, UA: NEGATIVE
Ketones, UA: NEGATIVE
Nitrite, UA: NEGATIVE
PH UA: 5 (ref 5.0–7.5)
PROTEIN UA: NEGATIVE
RBC, UA: NEGATIVE
Specific Gravity, UA: 1.01 (ref 1.005–1.030)
Urobilinogen, Ur: 0.2 mg/dL (ref 0.2–1.0)

## 2017-05-29 LAB — MICROSCOPIC EXAMINATION
Bacteria, UA: NONE SEEN
RBC, UA: NONE SEEN /hpf (ref 0–?)
Renal Epithel, UA: NONE SEEN /hpf

## 2017-05-29 MED ORDER — CEPHALEXIN 500 MG PO CAPS: 500.0000 mg | ORAL_CAPSULE | Freq: Three times a day (TID) | ORAL | 0 refills | Status: DC

## 2017-05-29 NOTE — Addendum Note (Signed)
Addended by: Zannie Cove on: 05/29/2017 05:00 PM   Modules accepted: Orders

## 2017-05-29 NOTE — Patient Instructions (Signed)
Take antibiotic as directed We will call with results of the urinalysis and the CBC is as those results become available We will schedule you for a scrotal ultrasound If the problems continue beyond taking the antibiotics we will most likely arrange for you to have an appointment with the urologist.

## 2017-05-29 NOTE — Progress Notes (Signed)
Subjective:    Patient ID: Frederick Davis, male    DOB: 1923/07/18, 83 y.o.   MRN: [id number]  HPI Patient here today for genital swelling x 6 weeks. This patient was just discharged from the hospital after a bout with chest pain. The workup was negative. He does have a history of heart disease and bypass surgery. He was seen initially several weeks ago because of scrotal swelling and infection and was sent to the surgeon about this and the surgeon questioned why he was seeing him. He's never had any antibiotics. The patient does have a couple devices in his scrotum 14 controlling his urine and another one for erectile dysfunction. Erectile dysfunction device does not work anymore. He does have some burning with voiding. In the onset he had a lot of swelling or on the left side is bigger is a baseball which lasted for a day or so and then seemed to get better. He still has some fullness mostly on the left side greater than the right side.     Patient Active Problem List   Diagnosis Date Noted  . Aortic stenosis 05/28/2017  . Mitral regurgitation 05/28/2017  . Chest pain 05/27/2017  . Coronary artery disease involving native coronary artery of native heart without angina pectoris 03/19/2017  . Dyslipidemia 03/19/2017  . S/P CABG x 3 12/08/2015  . Unstable angina ([place])   . Chest pain with moderate risk of acute coronary syndrome 12/05/2015  . Aortic stenosis, mild-2014 12/05/2015  . Anginal pain ([place]) 10/17/2015  . History of renal cell cancer-s/p nephrectomy 07/19/2013  . Hyperlipidemia 07/19/2013  . Hypotension arterial 03/18/2013  . Complete heart block ([place]) 03/18/2013  . Anemia   . CKD (chronic kidney disease) stage 3, GFR 30-59 ml/min 03/12/2013  . Acute MI, inferior wall, initial episode of care ([place]) 03/12/2013  . Impotence 03/12/2013  . History of renal stone 03/12/2013  . Hiatal hernia 03/12/2013  . H/O lithotripsy 03/12/2013  . SCCA (squamous cell carcinoma) of skin  03/12/2013  . S/P prostatectomy, radical 03/12/2013  . Left renal mass 03/12/2013  . Arthritis 03/12/2013  . History of STEMI- May 2014 03/12/2013  . CAD S/P RCA thrombectomy May 2014, CABG 11/2015 03/12/2013  . Cardiogenic shock ([place]) 03/12/2013  . Acute respiratory failure ([place]) 03/12/2013  . Altered mental status 03/12/2013  . Essential hypertension, benign 01/14/2013  . Colostomy in place [facility]) 01/14/2013  . Prostate cancer ([place]) 01/14/2013  . Renal cell carcinoma, Right 01/14/2013  . Osteoporosis, unspecified 01/14/2013   Outpatient Encounter Prescriptions as of 05/29/2017  Medication Sig  . acetaminophen (TYLENOL) 500 MG tablet Take 500 mg by mouth at bedtime.  Marland Kitchen amLODipine (NORVASC) 2.5 MG tablet Take 1 tablet (2.5 mg total) by mouth daily.  Marland Kitchen aspirin 81 MG tablet Take 1 tablet (81 mg total) by mouth daily. (Patient taking differently: Take 81 mg by mouth every evening. )  . carvedilol (COREG) 3.125 MG tablet Take 1 tablet (3.125 mg total) by mouth 2 (two) times daily with a meal.  . furosemide (LASIX) 20 MG tablet Take 1 tablet daily as needed. (Patient taking differently: Take one tablet by mouth once every other day)  . levothyroxine (SYNTHROID, LEVOTHROID) 75 MCG tablet Take 75 mcg by mouth daily before breakfast.  . nitroGLYCERIN (NITROSTAT) 0.4 MG SL tablet PLACE 1 TABLET UNDER THE TONGUE AT ONSET OF CHEST PAIN EVERY 5 MINTUES UP TO 3 TIMES AS NEEDED (Patient not taking: Reported on 05/29/2017)   Facility-Administered Encounter  Medications as of 05/29/2017  Medication  . etomidate (AMIDATE) injection  . lidocaine (cardiac) 100 mg/56ml (XYLOCAINE) 20 MG/ML injection 2%  . succinylcholine (ANECTINE) injection      Review of Systems  Constitutional: Negative.   HENT: Negative.   Eyes: Negative.   Respiratory: Negative.   Cardiovascular: Negative.   Gastrointestinal: Negative.   Endocrine: Negative.   Genitourinary: Positive for dysuria and scrotal swelling (redness).    Musculoskeletal: Negative.   Skin: Negative.   Allergic/Immunologic: Negative.   Neurological: Negative.   Hematological: Negative.   Psychiatric/Behavioral: Negative.        Objective:   Physical Exam  Constitutional: He is oriented to person, place, and time. He appears well-developed and well-nourished. No distress.  HENT:  Head: Normocephalic.  Eyes: Pupils are equal, round, and reactive to light. Conjunctivae and EOM are normal. Right eye exhibits no discharge. Left eye exhibits no discharge. No scleral icterus.  Neck: Normal range of motion.  Genitourinary: Penis normal.  Genitourinary Comments: The patient has a prosthesis for urinary retention and also one for erectile dysfunction. No apparent hernias were present. There was some slight epididymal swelling on the left greater than the right. There is no redness or warmth on the physical exam. There is no tenderness other than slightly tender on the left super testicular area.  Musculoskeletal: Normal range of motion.  Patient uses a cane for ambulation.  Neurological: He is alert and oriented to person, place, and time.  Skin: No rash noted.  Psychiatric: He has a normal mood and affect. His behavior is normal. Judgment and thought content normal.  Nursing note and vitals reviewed.  BP 122/77 (BP Location: Left Arm)   Pulse 61   Temp (!) 97 F (36.1 C) (Oral)   Ht 5\' 6"  (1.676 m)   Wt 166 lb (75.3 kg)   BMI 26.79 kg/m         Assessment & Plan:  1. Scrotal swelling - CBC with Differential/Platelet - Urinalysis, Complete - US Scrotum; Future -Keflex 500 mg 3 times daily for 10 days -Return to clinic in 10 days. If no better refer to urology.  2. Dysuria -Urinalysis and CBC  3. Scrotal pain -Scrotal ultrasound  Meds ordered this encounter  Medications  . cephALEXin (KEFLEX) 500 MG capsule    Sig: Take 1 capsule (500 mg total) by mouth 3 (three) times daily.    Dispense:  30 capsule    Refill:  0    Patient Instructions  Take antibiotic as directed We will call with results of the urinalysis and the CBC is as those results become available We will schedule you for a scrotal ultrasound If the problems continue beyond taking the antibiotics we will most likely arrange for you to have an appointment with the urologist.  Arrie Senate MD

## 2017-05-30 ENCOUNTER — Other Ambulatory Visit: Payer: Self-pay | Admitting: Family Medicine

## 2017-05-30 ENCOUNTER — Other Ambulatory Visit: Payer: Self-pay

## 2017-05-30 DIAGNOSIS — N5082 Scrotal pain: Secondary | ICD-10-CM

## 2017-05-30 LAB — CBC WITH DIFFERENTIAL/PLATELET
Basophils Absolute: 0.1 10*3/uL (ref 0.0–0.2)
Basos: 1 %
EOS (ABSOLUTE): 0.4 10*3/uL (ref 0.0–0.4)
Eos: 4 %
HEMATOCRIT: 37.8 % (ref 37.5–51.0)
Hemoglobin: 11.9 g/dL — ABNORMAL LOW (ref 13.0–17.7)
IMMATURE GRANULOCYTES: 0 %
Immature Grans (Abs): 0 10*3/uL (ref 0.0–0.1)
LYMPHS: 23 %
Lymphocytes Absolute: 2 10*3/uL (ref 0.7–3.1)
MCH: 26.8 pg (ref 26.6–33.0)
MCHC: 31.5 g/dL (ref 31.5–35.7)
MCV: 85 fL (ref 79–97)
MONOCYTES: 7 %
MONOS ABS: 0.6 10*3/uL (ref 0.1–0.9)
Neutrophils Absolute: 5.8 10*3/uL (ref 1.4–7.0)
Neutrophils: 65 %
Platelets: 246 10*3/uL (ref 150–379)
RBC: 4.44 x10E6/uL (ref 4.14–5.80)
RDW: 14.2 % (ref 12.3–15.4)
WBC: 8.8 10*3/uL (ref 3.4–10.8)

## 2017-05-30 NOTE — Progress Notes (Unsigned)
[place] ar

## 2017-05-31 LAB — URINE CULTURE

## 2017-06-02 ENCOUNTER — Ambulatory Visit (HOSPITAL_COMMUNITY): Payer: Medicare Other

## 2017-06-03 ENCOUNTER — Ambulatory Visit (HOSPITAL_COMMUNITY)
Admission: RE | Admit: 2017-06-03 | Discharge: 2017-06-03 | Disposition: A | Discharge status: Home / Self Care | Payer: Medicare Other | Source: Ambulatory Visit | Attending: Family Medicine | Admitting: Family Medicine

## 2017-06-03 DIAGNOSIS — N5089 Other specified disorders of the male genital organs: Secondary | ICD-10-CM

## 2017-06-03 DIAGNOSIS — N433 Hydrocele, unspecified: Secondary | ICD-10-CM | POA: Insufficient documentation

## 2017-06-03 DIAGNOSIS — I861 Scrotal varices: Secondary | ICD-10-CM | POA: Insufficient documentation

## 2017-06-03 DIAGNOSIS — N5082 Scrotal pain: Secondary | ICD-10-CM

## 2017-06-03 DIAGNOSIS — N503 Cyst of epididymis: Secondary | ICD-10-CM | POA: Insufficient documentation

## 2017-06-11 ENCOUNTER — Encounter: Payer: Self-pay | Admitting: Family Medicine

## 2017-06-11 ENCOUNTER — Ambulatory Visit (INDEPENDENT_AMBULATORY_CARE_PROVIDER_SITE_OTHER): Payer: Medicare Other | Admitting: Family Medicine

## 2017-06-11 VITALS — BP 120/70 | HR 61 | Temp 97.1°F | Ht 66.0 in | Wt 166.0 lb

## 2017-06-11 DIAGNOSIS — I2510 Atherosclerotic heart disease of native coronary artery without angina pectoris: Secondary | ICD-10-CM | POA: Diagnosis not present

## 2017-06-11 DIAGNOSIS — N390 Urinary tract infection, site not specified: Secondary | ICD-10-CM | POA: Diagnosis not present

## 2017-06-11 DIAGNOSIS — Z87440 Personal history of urinary (tract) infections: Secondary | ICD-10-CM | POA: Diagnosis not present

## 2017-06-11 DIAGNOSIS — N5089 Other specified disorders of the male genital organs: Secondary | ICD-10-CM | POA: Diagnosis not present

## 2017-06-11 NOTE — Patient Instructions (Signed)
We will call with results of the urinalysis and a culture and sensitivity is soon as that becomes available No more antibiotics for the time being until those results are returned

## 2017-06-11 NOTE — Progress Notes (Signed)
Subjective:    Patient ID: Frederick Davis, male    DOB: July 12, 1923, 83 y.o.   MRN: [id number]  HPI  Patient here today for 10 day follow up on scrotal swelling. The patient is doing better and has had no further recurrence of the swelling. He has no other complaints today. We told and if he continues to have problems with swelling and pain or if this occurs again he would most likely getting to see the urologist.    Patient Active Problem List   Diagnosis Date Noted  . Aortic stenosis 05/28/2017  . Mitral regurgitation 05/28/2017  . Chest pain 05/27/2017  . Coronary artery disease involving native coronary artery of native heart without angina pectoris 03/19/2017  . Dyslipidemia 03/19/2017  . S/P CABG x 3 12/08/2015  . Unstable angina ([place])   . Chest pain with moderate risk of acute coronary syndrome 12/05/2015  . Aortic stenosis, mild-2014 12/05/2015  . Anginal pain ([place]) 10/17/2015  . History of renal cell cancer-s/p nephrectomy 07/19/2013  . Hyperlipidemia 07/19/2013  . Hypotension arterial 03/18/2013  . Complete heart block ([place]) 03/18/2013  . Anemia   . CKD (chronic kidney disease) stage 3, GFR 30-59 ml/min 03/12/2013  . Acute MI, inferior wall, initial episode of care ([place]) 03/12/2013  . Impotence 03/12/2013  . History of renal stone 03/12/2013  . Hiatal hernia 03/12/2013  . H/O lithotripsy 03/12/2013  . SCCA (squamous cell carcinoma) of skin 03/12/2013  . S/P prostatectomy, radical 03/12/2013  . Left renal mass 03/12/2013  . Arthritis 03/12/2013  . History of STEMI- May 2014 03/12/2013  . CAD S/P RCA thrombectomy May 2014, CABG 11/2015 03/12/2013  . Cardiogenic shock ([place]) 03/12/2013  . Acute respiratory failure ([place]) 03/12/2013  . Altered mental status 03/12/2013  . Essential hypertension, benign 01/14/2013  . Colostomy in place [facility]) 01/14/2013  . Prostate cancer ([place]) 01/14/2013  . Renal cell carcinoma, Right 01/14/2013  . Osteoporosis, unspecified  01/14/2013   Outpatient Encounter Prescriptions as of 06/11/2017  Medication Sig  . acetaminophen (TYLENOL) 500 MG tablet Take 500 mg by mouth at bedtime.  Marland Kitchen amLODipine (NORVASC) 2.5 MG tablet Take 1 tablet (2.5 mg total) by mouth daily.  Marland Kitchen aspirin 81 MG tablet Take 1 tablet (81 mg total) by mouth daily. (Patient taking differently: Take 81 mg by mouth every evening. )  . carvedilol (COREG) 3.125 MG tablet Take 1 tablet (3.125 mg total) by mouth 2 (two) times daily with a meal.  . furosemide (LASIX) 20 MG tablet Take 1 tablet daily as needed. (Patient taking differently: Take one tablet by mouth once every other day)  . levothyroxine (SYNTHROID, LEVOTHROID) 75 MCG tablet Take 75 mcg by mouth daily before breakfast.  . nitroGLYCERIN (NITROSTAT) 0.4 MG SL tablet PLACE 1 TABLET UNDER THE TONGUE AT ONSET OF CHEST PAIN EVERY 5 MINTUES UP TO 3 TIMES AS NEEDED  . [DISCONTINUED] cephALEXin (KEFLEX) 500 MG capsule Take 1 capsule (500 mg total) by mouth 3 (three) times daily.   Facility-Administered Encounter Medications as of 06/11/2017  Medication  . etomidate (AMIDATE) injection  . lidocaine (cardiac) 100 mg/50ml (XYLOCAINE) 20 MG/ML injection 2%  . succinylcholine (ANECTINE) injection     Review of Systems  Constitutional: Negative.   HENT: Negative.   Eyes: Negative.   Respiratory: Negative.   Cardiovascular: Negative.   Gastrointestinal: Negative.   Endocrine: Negative.   Genitourinary: Negative.   Musculoskeletal: Negative.   Skin: Negative.   Allergic/Immunologic: Negative.   Neurological: Negative.  Hematological: Negative.   Psychiatric/Behavioral: Negative.        Objective:   Physical Exam  Constitutional: He is oriented to person, place, and time. He appears well-developed and well-nourished.  Alert and positive  HENT:  Head: Normocephalic.  Eyes: Pupils are equal, round, and reactive to light. EOM are normal. Right eye exhibits no discharge. Left eye exhibits no  discharge. No scleral icterus.  Neck: Normal range of motion.  Cardiovascular: Normal rate and regular rhythm.   Murmur heard. Pulmonary/Chest: Effort normal and breath sounds normal. He has no wheezes. He has no rales.  Genitourinary: Penis normal.  Genitourinary Comments: Patient has prosthetic erectile device present there were no testicular masses and there was no swelling present on either side. A rectal exam was not done today.  Musculoskeletal: Normal range of motion. He exhibits no edema.  Neurological: He is alert and oriented to person, place, and time.  Skin: Skin is warm and dry. No rash noted.  Psychiatric: He has a normal mood and affect. His behavior is normal. Judgment and thought content normal.  Nursing note and vitals reviewed.   BP (!) 156/93 (BP Location: Left Arm)   Pulse 61   Temp (!) 97.1 F (36.2 C) (Oral)   Ht 5\' 6"  (1.676 m)   Wt 166 lb (75.3 kg)   BMI 26.79 kg/m        Assessment & Plan:  1. Recent urinary tract infection -Repeat urinalysis with urine culture after completing a course of Keflex. - Urine Culture  2. Scrotal swelling -This has resolved completely without any recurrence. If the problem recurs again we will most likely have him to see the urologist.  Patient Instructions  We will call with results of the urinalysis and a culture and sensitivity is soon as that becomes available No more antibiotics for the time being until those results are returned  Arrie Senate MD

## 2017-06-13 LAB — URINE CULTURE

## 2017-06-17 NOTE — Progress Notes (Signed)
Cardiology Office Note    Date:  06/18/2017   ID:  Frederick Davis, DOB 11/16/1922, MRN [id number]  PCP:  Chipper Herb, MD  Cardiologist: [facility]  Chief Complaint  Patient presents with  . 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 Discharge 05/28/17 after admission with chest pain that was atypical. Troponins remain negative. Because of advanced age and renal dysfunction no ischemic testing was recommended. 2-D echo in the hospital LVEF 60-65% with mild to moderate aortic stenosis.  Patient comes in today accompanied by his wife. Have a lot of questions concerning when they should go to the emergency room if he ever has chest pain again. All questions were answered. He has not had any more chest pain since discharge. He does have some dyspnea on exertion. He isn't able to exercise much because of left knee pain. He tries to ride the bike once a week.     Past Medical History:  Diagnosis Date  . Anemia   . Aortic stenosis    a. mild-mod by TEE 11/2015.  Marland Kitchen CAD (coronary artery disease)    a. RCA thrombectomy 2014. b. CABGx3 in 11/2015 (EF 45-50% in 11/2015).  . Chronic renal insufficiency, stage III (moderate)   . Esophageal stricture   . Hypertension   . Hypothyroidism   . Mitral regurgitation    a. mild by TEE 11/2015.  Marland Kitchen Myocardial infarction ([place]) 03/12/13   Inferior MI.  LAD mid 80% stenosis, circ mild plaque, RCA 100% with thrombectomy PTCA.  EF 65%  . Osteoporosis   . PC (prostate cancer) ([place]) 1987  . RBBB   . Rectum cancer ([place]) 1986  . Renal cell cancer [facility])     Past Surgical History:  Procedure Laterality Date  . CARDIAC CATHETERIZATION N/A 12/06/2015   Procedure: Left Heart Cath and Coronary Angiography;  Surgeon: Burnell Blanks, MD;  Location: [facility] CV LAB;   Service: Cardiovascular;  Laterality: N/A;  . COLONOSCOPY  01/08/2012   Procedure: COLONOSCOPY;  Surgeon: Rogene Houston, MD;  [facility];  Service: Endoscopy;  Laterality: N/A;  1030  . COLONOSCOPY N/A 07/20/2015   Procedure: COLONOSCOPY;  Surgeon: Rogene Houston, MD;  [facility];  Service: Endoscopy;  Laterality: N/A;  930 - moved to 9/29 @ 2:00  . COLOSTOMY Left 1986   colon cancer  . CORONARY ARTERY BYPASS GRAFT N/A 12/08/2015   Procedure: CORONARY ARTERY BYPASS GRAFTING (CABG);  Surgeon: Melrose Nakayama, MD;  Location: [facility];  Service: Open Heart Surgery;  Laterality: N/A;  Times 3 using left internal mammary artery and endoscopycally harvested bilateral saphenous vein.  Marland Kitchen HERNIA REPAIR     2007  . KIDNEY SURGERY  2004  . LEFT HEART CATHETERIZATION WITH CORONARY ANGIOGRAM N/A 03/12/2013   Procedure: STEMI;  Surgeon: Burnell Blanks, MD;  Location: [facility] CATH LAB;  Service: Cardiovascular;  Laterality: N/A;  . PERCUTANEOUS CORONARY INTERVENTION-[place] ONLY    . PROSTATECTOMY    . [place]  2006  . renal sphincter appliance  2007  . Right inguinal hernia repair    . TEE WITHOUT CARDIOVERSION N/A 12/08/2015   Procedure: TRANSESOPHAGEAL ECHOCARDIOGRAM (TEE);  Surgeon: Melrose Nakayama, MD;  Location: [facility];  Service: Open Heart Surgery;  Laterality: N/A;    Current Medications: Current Meds  Medication Sig  . acetaminophen (TYLENOL) 500 MG tablet Take 500 mg by mouth at bedtime.  Marland Kitchen amLODipine (NORVASC) 2.5 MG tablet Take 1 tablet (2.5 mg total) by mouth daily.  Marland Kitchen aspirin 81 MG tablet Take 1 tablet (81 mg total) by mouth daily. (Patient taking differently: Take 81 mg by mouth every evening. )  . carvedilol (COREG) 3.125 MG tablet Take 1 tablet (3.125 mg total) by mouth 2 (two) times daily with a meal.  . furosemide (LASIX) 20 MG tablet Take 1 tablet daily as needed. (Patient taking differently: Take one tablet by mouth once  every other day)  . levothyroxine (SYNTHROID, LEVOTHROID) 75 MCG tablet Take 75 mcg by mouth daily before breakfast.  . nitroGLYCERIN (NITROSTAT) 0.4 MG SL tablet PLACE 1 TABLET UNDER THE TONGUE AT ONSET OF CHEST PAIN EVERY 5 MINTUES UP TO 3 TIMES AS NEEDED     Allergies:   Lipitor [atorvastatin]   Social History   Social History  . Marital status: Married    Spouse name: Frederick Davis  . Number of children: N/A  . Years of education: N/A   Occupational History  . pharmacist-retired   . preacher-retired    Social History Main Topics  . Smoking status: Former Smoker    Packs/day: 0.50    Years: 10.00    Types: Cigarettes    Start date: 10/21/1946    Quit date: 10/21/1968  . Smokeless tobacco: Never Used  . Alcohol use 0.5 oz/week    1 Standard drinks or equivalent per week     Comment: rare, [employer] - less than once a week  . Drug use: No  . Sexual activity: Not Asked   Other Topics Concern  . None   Social History Narrative  . None     Family History:  The patient's family history includes AAA (abdominal aortic aneurysm) (age of onset: 32) in his father; Stroke (age of onset: 33) in his mother.   ROS:   Please see the history of present illness.    Review of Systems  Constitution: Negative.  HENT: Negative.   Cardiovascular: Positive for leg swelling.  Respiratory: Negative.   Endocrine: Negative.   Hematologic/Lymphatic: Negative.   Musculoskeletal: Positive for arthritis, joint pain and myalgias.  Gastrointestinal: Negative.   Genitourinary: Negative.   Neurological: Negative.    All other systems reviewed and are negative.   PHYSICAL EXAM:   VS:  BP 122/76 (BP Location: Left Arm)   Pulse 71   Ht 5\' 7"  (1.702 m)   Wt 166 lb (75.3 kg)   SpO2 98%   BMI 26.00 kg/m   Physical Exam  GEN: Well nourished, well developed, in no acute distress  Neck: no JVD, carotid bruits, or masses [id number]:FGH;8-2/9 harsh systolic murmur at the left sternal  border Respiratory:  clear to auscultation bilaterally, normal work of breathing GI: soft, nontender, nondistended, + BS Ext: Left leg with chronic edema, minimal edema on the right without cyanosis, clubbingGood distal pulses bilaterally Neuro:  Alert and Oriented x 3 Psych: euthymic mood, full affect  Wt Readings from Last 3 Encounters:  06/18/17 166 lb (75.3 kg)  06/11/17 166 lb (75.3 kg)  05/29/17 166 lb (75.3 kg)      Studies/Labs Reviewed:   EKG:  EKG is not ordered today.    Recent Labs: 10/29/2016: TSH 1.810 03/24/2017: ALT 10 05/27/2017: BUN 35; Creatinine, Ser 1.56; Potassium 4.3; Sodium 136 05/29/2017: Hemoglobin 11.9;  Platelets 246   Lipid Panel    Component Value Date/Time   CHOL 148 03/24/2017 1236   CHOL 106 05/27/2013 0818   TRIG 78 03/24/2017 1236   TRIG 81 10/29/2016 0815   TRIG 76 05/27/2013 0818   HDL 66 03/24/2017 1236   HDL 54 10/29/2016 0815   HDL 62 05/27/2013 0818   CHOLHDL 2.2 03/24/2017 1236   CHOLHDL 2.8 10/18/2015 0530   VLDL 19 10/18/2015 0530   LDLCALC 66 03/24/2017 1236   LDLCALC 29 08/09/2014 0854   LDLCALC 29 05/27/2013 0818    Additional studies/ records that were reviewed today include:  2-D echo 05/28/17  Study Conclusions   - Left ventricle: The cavity size was normal. Wall thickness was   increased in a pattern of moderate LVH. Systolic function was   normal. The estimated ejection fraction was in the range of 60%   to 65%. Wall motion was normal; there were no regional wall   motion abnormalities. The study is not technically sufficient to   allow evaluation of LV diastolic function. - Aortic valve: Severely calcified annulus. Trileaflet; severely   thickened leaflets. There was mild to moderate stenosis. Mean   gradient (S): 14 mm Hg. Valve area (VTI): 1.14 cm^2. Valve area   (Vmax): 1 cm^2. - Mitral valve: Mildly calcified annulus. Mildly thickened leaflets   . There was mild regurgitation. - Left atrium: The atrium was  mildly dilated. - Atrial septum: No defect or patent foramen ovale was identified. - Technically adequate study.   TEE 11/2015 Study Conclusions   - Left ventricle: Systolic function was mildly reduced. The   estimated ejection fraction was in the range of 45% to 50%. - Aortic valve: Moderately calcified annulus. Trileaflet;   moderately thickened leaflets. There was mild to moderate   stenosis. - Mitral valve: There was mild regurgitation. - Left atrium: The atrium was dilated. No evidence of thrombus in   the atrial cavity or appendage. - Atrial septum: A septal defect cannot be excluded. A patent   foramen ovale cannot be excluded. - Tricuspid valve: No evidence of vegetation. - Pulmonic valve: No evidence of vegetation.     ASSESSMENT:    1. Coronary artery disease involving native coronary artery of native heart without angina pectoris   2. Essential hypertension, benign   3. Aortic stenosis, mild-2014   4. Pure hypercholesterolemia      PLAN:  In order of problems listed above:  CAD status post CABG 32/2017. Recent hospitalization with atypical chest pain with negative troponins. Because of advanced age and CK D no invasive workup recommended. No further chest pain since hospitalization. Follow-up with Dr.Hochrein in 2 months in [place]. Beta blocker not increased because of history of bradycardia. LV function has normalized.  Essential hypertension controlled  Mild to moderate aortic stenosis on 2-D echo with normal LV function 05/2017    Medication Adjustments/Labs and Tests Ordered: Current medicines are reviewed at length with the patient today.  Concerns regarding medicines are outlined above.  Medication changes, Labs and Tests ordered today are listed in the Patient Instructions below. Patient Instructions  Medication Instructions:  Your physician recommends that you continue on your current medications as directed. Please refer to the Current Medication list  given to you today.   Labwork: NONE   Testing/Procedures: NONE  Follow-Up: Your physician recommends that you schedule a follow-up appointment in: 2 Months with Dr. Percival Spanish    Any Other Special Instructions Will Be Listed Below (If  Applicable).     If you need a refill on your cardiac medications before your next appointment, please call your pharmacy.  Thank you for choosing [facility]!      Sumner Boast, PA-C  06/18/2017 11:43 AM    [facility] [address], [place], [place]  [address] Phone: [phone]; Fax: [phone]

## 2017-06-18 ENCOUNTER — Encounter: Payer: Self-pay | Admitting: Physician Assistant

## 2017-06-18 ENCOUNTER — Ambulatory Visit (INDEPENDENT_AMBULATORY_CARE_PROVIDER_SITE_OTHER): Payer: Medicare Other | Admitting: Physician Assistant

## 2017-06-18 VITALS — BP 122/76 | HR 71 | Ht 67.0 in | Wt 166.0 lb

## 2017-06-18 DIAGNOSIS — I2510 Atherosclerotic heart disease of native coronary artery without angina pectoris: Secondary | ICD-10-CM | POA: Diagnosis not present

## 2017-06-18 DIAGNOSIS — I10 Essential (primary) hypertension: Secondary | ICD-10-CM | POA: Diagnosis not present

## 2017-06-18 DIAGNOSIS — I350 Nonrheumatic aortic (valve) stenosis: Secondary | ICD-10-CM

## 2017-06-18 NOTE — Patient Instructions (Signed)
Medication Instructions:  Your physician recommends that you continue on your current medications as directed. Please refer to the Current Medication list given to you today.   Labwork: NONE   Testing/Procedures: NONE  Follow-Up: Your physician recommends that you schedule a follow-up appointment in: 2 Months with Dr. Percival Spanish    Any Other Special Instructions Will Be Listed Below (If Applicable).     If you need a refill on your cardiac medications before your next appointment, please call your pharmacy.  Thank you for choosing [facility]!

## 2017-07-21 DIAGNOSIS — Z85828 Personal history of other malignant neoplasm of skin: Secondary | ICD-10-CM | POA: Diagnosis not present

## 2017-07-21 DIAGNOSIS — L821 Other seborrheic keratosis: Secondary | ICD-10-CM | POA: Diagnosis not present

## 2017-07-21 DIAGNOSIS — D692 Other nonthrombocytopenic purpura: Secondary | ICD-10-CM | POA: Diagnosis not present

## 2017-07-21 DIAGNOSIS — D225 Melanocytic nevi of trunk: Secondary | ICD-10-CM | POA: Diagnosis not present

## 2017-07-21 DIAGNOSIS — L570 Actinic keratosis: Secondary | ICD-10-CM | POA: Diagnosis not present

## 2017-07-21 DIAGNOSIS — D1801 Hemangioma of skin and subcutaneous tissue: Secondary | ICD-10-CM | POA: Diagnosis not present

## 2017-07-24 ENCOUNTER — Other Ambulatory Visit: Payer: Self-pay | Admitting: Family Medicine

## 2017-08-06 ENCOUNTER — Other Ambulatory Visit: Payer: Medicare Other

## 2017-08-06 ENCOUNTER — Ambulatory Visit (INDEPENDENT_AMBULATORY_CARE_PROVIDER_SITE_OTHER): Payer: Medicare Other

## 2017-08-06 DIAGNOSIS — E7800 Pure hypercholesterolemia, unspecified: Secondary | ICD-10-CM

## 2017-08-06 DIAGNOSIS — E559 Vitamin D deficiency, unspecified: Secondary | ICD-10-CM

## 2017-08-06 DIAGNOSIS — C61 Malignant neoplasm of prostate: Secondary | ICD-10-CM | POA: Diagnosis not present

## 2017-08-06 DIAGNOSIS — I2510 Atherosclerotic heart disease of native coronary artery without angina pectoris: Secondary | ICD-10-CM | POA: Diagnosis not present

## 2017-08-06 DIAGNOSIS — Z23 Encounter for immunization: Secondary | ICD-10-CM

## 2017-08-06 DIAGNOSIS — I10 Essential (primary) hypertension: Secondary | ICD-10-CM

## 2017-08-06 DIAGNOSIS — N183 Chronic kidney disease, stage 3 unspecified: Secondary | ICD-10-CM

## 2017-08-07 LAB — BMP8+EGFR
BUN/Creatinine Ratio: 18 (ref 10–24)
BUN: 27 mg/dL (ref 10–36)
CO2: 21 mmol/L (ref 20–29)
Calcium: 9.8 mg/dL (ref 8.6–10.2)
Chloride: 106 mmol/L (ref 96–106)
Creatinine, Ser: 1.49 mg/dL — ABNORMAL HIGH (ref 0.76–1.27)
GFR calc Af Amer: 46 mL/min/{1.73_m2} — ABNORMAL LOW (ref >59)
GFR, EST NON AFRICAN AMERICAN: 40 mL/min/{1.73_m2} — AB (ref >59)
Glucose: 85 mg/dL (ref 65–99)
Potassium: 4.8 mmol/L (ref 3.5–5.2)
SODIUM: 141 mmol/L (ref 134–144)

## 2017-08-07 LAB — CBC WITH DIFFERENTIAL/PLATELET
Basophils Absolute: 0.1 10*3/uL (ref 0.0–0.2)
Basos: 1 %
EOS (ABSOLUTE): 0.3 10*3/uL (ref 0.0–0.4)
EOS: 5 %
HEMATOCRIT: 34.4 % — AB (ref 37.5–51.0)
HEMOGLOBIN: 11.7 g/dL — AB (ref 13.0–17.7)
Immature Grans (Abs): 0 10*3/uL (ref 0.0–0.1)
Immature Granulocytes: 0 %
LYMPHS ABS: 1.7 10*3/uL (ref 0.7–3.1)
Lymphs: 23 %
MCH: 27.1 pg (ref 26.6–33.0)
MCHC: 34 g/dL (ref 31.5–35.7)
MCV: 80 fL (ref 79–97)
MONOCYTES: 9 %
Monocytes Absolute: 0.7 10*3/uL (ref 0.1–0.9)
NEUTROS ABS: 4.8 10*3/uL (ref 1.4–7.0)
Neutrophils: 62 %
Platelets: 249 10*3/uL (ref 150–379)
RBC: 4.32 x10E6/uL (ref 4.14–5.80)
RDW: 14.8 % (ref 12.3–15.4)
WBC: 7.6 10*3/uL (ref 3.4–10.8)

## 2017-08-07 LAB — LIPID PANEL
CHOLESTEROL TOTAL: 148 mg/dL (ref 100–199)
Chol/HDL Ratio: 2.5 ratio (ref 0.0–5.0)
HDL: 60 mg/dL (ref >39)
LDL Calculated: 71 mg/dL (ref 0–99)
TRIGLYCERIDES: 86 mg/dL (ref 0–149)
VLDL Cholesterol Cal: 17 mg/dL (ref 5–40)

## 2017-08-07 LAB — HEPATIC FUNCTION PANEL
ALK PHOS: 120 IU/L — AB (ref 39–117)
ALT: 9 IU/L (ref 0–44)
AST: 15 IU/L (ref 0–40)
Albumin: 4 g/dL (ref 3.2–4.6)
BILIRUBIN, DIRECT: 0.15 mg/dL (ref 0.00–0.40)
Bilirubin Total: 0.4 mg/dL (ref 0.0–1.2)
TOTAL PROTEIN: 6.3 g/dL (ref 6.0–8.5)

## 2017-08-07 LAB — VITAMIN D 25 HYDROXY (VIT D DEFICIENCY, FRACTURES): Vit D, 25-Hydroxy: 28.5 ng/mL — ABNORMAL LOW (ref 30.0–100.0)

## 2017-08-11 ENCOUNTER — Ambulatory Visit (INDEPENDENT_AMBULATORY_CARE_PROVIDER_SITE_OTHER): Payer: Medicare Other | Admitting: Family Medicine

## 2017-08-11 ENCOUNTER — Encounter: Payer: Self-pay | Admitting: Family Medicine

## 2017-08-11 VITALS — BP 113/69 | HR 52 | Temp 96.7°F | Ht 67.0 in | Wt 166.0 lb

## 2017-08-11 DIAGNOSIS — E7800 Pure hypercholesterolemia, unspecified: Secondary | ICD-10-CM

## 2017-08-11 DIAGNOSIS — N183 Chronic kidney disease, stage 3 unspecified: Secondary | ICD-10-CM

## 2017-08-11 DIAGNOSIS — E559 Vitamin D deficiency, unspecified: Secondary | ICD-10-CM | POA: Diagnosis not present

## 2017-08-11 DIAGNOSIS — Z933 Colostomy status: Secondary | ICD-10-CM | POA: Diagnosis not present

## 2017-08-11 DIAGNOSIS — I10 Essential (primary) hypertension: Secondary | ICD-10-CM | POA: Diagnosis not present

## 2017-08-11 DIAGNOSIS — C61 Malignant neoplasm of prostate: Secondary | ICD-10-CM | POA: Diagnosis not present

## 2017-08-11 DIAGNOSIS — I2510 Atherosclerotic heart disease of native coronary artery without angina pectoris: Secondary | ICD-10-CM

## 2017-08-11 DIAGNOSIS — I739 Peripheral vascular disease, unspecified: Secondary | ICD-10-CM | POA: Diagnosis not present

## 2017-08-11 DIAGNOSIS — C641 Malignant neoplasm of right kidney, except renal pelvis: Secondary | ICD-10-CM

## 2017-08-11 NOTE — Progress Notes (Signed)
Subjective:    Patient ID: Frederick Davis, male    DOB: May 03, 1923, 83 y.o.   MRN: [id number]  HPI Pt here for follow up and management of chronic medical problems which includes hyperlipidemia and hypertension. He is taking medication regularly.  The patient is doing well overall even though he has multiple medical issues with the most recent one being bypass surgery.  He is followed regularly by the cardiologist.  He is also seen periodically by the urologist.  He has had diagnostic imaging of his abdomen.  He does have a history of prostate cancer.  He has had recent lab work done and this will be reviewed with him during the visit today.  He will also be given an FOBT to return.  All liver function test but 1 were within normal limits the alkaline phosphatase was slightly elevated but less so than 4 months ago.  The CBC has a normal white blood cell count.  Hemoglobin remains slightly decreased but stable at 11.7.  The platelet count is adequate.  The blood sugar was good at 85.  The creatinine, the most important kidney function test remains slightly elevated but less so than it was 2 months ago.  This value is 1.49.  All electrolytes including potassium are good all cholesterol numbers were almost at goal with an LDL C being 70.  Triglycerides are good at 86.  The vitamin D level remains decreased and we will make sure that the patient is continuing to take his vitamin D replacement.  Patient denies any chest pain or shortness of breath.  He has a little trouble with swallowing every now and then but otherwise no problems.  His intestinal tract is working as it would normally be expected and there is no nausea vomiting diarrhea or blood in the stool.  He is passing his water without problems.  He is not certain about his future urology appointment because his previous doctor has moved but he thinks he has another appointment for follow-up.  He is also not certain about his eye exam.  We will check  with his wife regarding these 2 appointments.  He has stopped taking his vitamin D3 and will restart this.    Patient Active Problem List   Diagnosis Date Noted  . Aortic stenosis 05/28/2017  . Mitral regurgitation 05/28/2017  . Chest pain 05/27/2017  . Coronary artery disease involving native coronary artery of native heart without angina pectoris 03/19/2017  . Dyslipidemia 03/19/2017  . S/P CABG x 3 12/08/2015  . Unstable angina ([place])   . Chest pain with moderate risk of acute coronary syndrome 12/05/2015  . Aortic stenosis, mild-2014 12/05/2015  . Anginal pain ([place]) 10/17/2015  . History of renal cell cancer-s/p nephrectomy 07/19/2013  . Hyperlipidemia 07/19/2013  . Hypotension arterial 03/18/2013  . Complete heart block ([place]) 03/18/2013  . Anemia   . CKD (chronic kidney disease) stage 3, GFR 30-59 ml/min (HCC) 03/12/2013  . Acute MI, inferior wall, initial episode of care ([place]) 03/12/2013  . Impotence 03/12/2013  . History of renal stone 03/12/2013  . Hiatal hernia 03/12/2013  . H/O lithotripsy 03/12/2013  . SCCA (squamous cell carcinoma) of skin 03/12/2013  . S/P prostatectomy, radical 03/12/2013  . Left renal mass 03/12/2013  . Arthritis 03/12/2013  . History of STEMI- May 2014 03/12/2013  . CAD S/P RCA thrombectomy May 2014, CABG 11/2015 03/12/2013  . Cardiogenic shock ([place]) 03/12/2013  . Acute respiratory failure ([place]) 03/12/2013  . Altered mental  status 03/12/2013  . Essential hypertension, benign 01/14/2013  . Colostomy in place [facility]) 01/14/2013  . Prostate cancer ([place]) 01/14/2013  . Renal cell carcinoma, Right 01/14/2013  . Osteoporosis, unspecified 01/14/2013   Outpatient Encounter Prescriptions as of 08/11/2017  Medication Sig  . acetaminophen (TYLENOL) 500 MG tablet Take 500 mg by mouth at bedtime.  Marland Kitchen amLODipine (NORVASC) 2.5 MG tablet Take 1 tablet (2.5 mg total) by mouth daily.  Marland Kitchen aspirin 81 MG tablet Take 1 tablet (81 mg total) by mouth daily. (Patient  taking differently: Take 81 mg by mouth every evening. )  . carvedilol (COREG) 3.125 MG tablet TAKE  (1)  TABLET TWICE A DAY WITH MEALS (BREAKFAST AND SUPPER)  . furosemide (LASIX) 20 MG tablet Take 1 tablet daily as needed. (Patient taking differently: Take one tablet by mouth once every other day)  . levothyroxine (SYNTHROID, LEVOTHROID) 75 MCG tablet Take 75 mcg by mouth daily before breakfast.  . nitroGLYCERIN (NITROSTAT) 0.4 MG SL tablet PLACE 1 TABLET UNDER THE TONGUE AT ONSET OF CHEST PAIN EVERY 5 MINTUES UP TO 3 TIMES AS NEEDED   Facility-Administered Encounter Medications as of 08/11/2017  Medication  . etomidate (AMIDATE) injection  . lidocaine (cardiac) 100 mg/1ml (XYLOCAINE) 20 MG/ML injection 2%  . succinylcholine (ANECTINE) injection     Review of Systems  Constitutional: Negative.   HENT: Negative.   Eyes: Negative.   Respiratory: Negative.   Cardiovascular: Negative.   Gastrointestinal: Negative.   Endocrine: Negative.   Genitourinary: Negative.   Musculoskeletal: Negative.   Skin: Negative.   Allergic/Immunologic: Negative.   Neurological: Negative.   Hematological: Negative.   Psychiatric/Behavioral: Negative.        Objective:   Physical Exam  Constitutional: He is oriented to person, place, and time. He appears well-developed and well-nourished. No distress.  The patient is doing well especially for his age and looks much younger than his stated age.  HENT:  Head: Normocephalic and atraumatic.  Right Ear: External ear normal.  Left Ear: External ear normal.  Nose: Nose normal.  Mouth/Throat: Oropharynx is clear and moist. No oropharyngeal exudate.  Minimal wax bilaterally  Eyes: Pupils are equal, round, and reactive to light. Conjunctivae and EOM are normal. Right eye exhibits no discharge. Left eye exhibits no discharge. No scleral icterus.  Get eye exam as planned  Neck: Normal range of motion. Neck supple. No tracheal deviation present. No  thyromegaly present.  No bruits thyromegaly or anterior cervical adenopathy  Cardiovascular: Normal rate, regular rhythm and intact distal pulses.  Exam reveals no friction rub.   Murmur heard. Pulses were present in the lower extremities but diminished.  Heart is regular at 60 /min with a grade 3/6 systolic ejection murmur  Pulmonary/Chest: Effort normal and breath sounds normal. No respiratory distress. He has no wheezes. He has no rales.  Clear anteriorly and posteriorly and no axillary adenopathy  Abdominal: Soft. Bowel sounds are normal. He exhibits no mass. There is no tenderness. There is no rebound and no guarding.  Colostomy present and draining properly secondary to removal of colon from colon cancer over 30 years ago.  No liver or spleen enlargement and no masses and no inguinal adenopathy  Musculoskeletal: Normal range of motion. He exhibits no edema.  Lymphadenopathy:    He has no cervical adenopathy.  Neurological: He is alert and oriented to person, place, and time. He has normal reflexes. No cranial nerve deficit.  Skin: Skin is warm and dry. No rash noted.  Psychiatric: He has a normal mood and affect. His behavior is normal. Judgment and thought content normal.  Nursing note and vitals reviewed.   BP 113/69 (BP Location: Left Arm)   Pulse (!) 52   Temp (!) 96.7 F (35.9 C) (Oral)   Ht 5\' 7"  (1.702 m)   Wt 166 lb (75.3 kg)   BMI 26.00 kg/m        Assessment & Plan:  1. ASCVD (arteriosclerotic cardiovascular disease) -Continue to follow-up with cardiology every 6 months  2. Essential hypertension, benign -Blood pressure is good today and he will continue with current treatment  3. Pure hypercholesterolemia -Continue with as aggressive therapeutic lifestyle changes as possible with an LDL C that is 71.  4. Vitamin D deficiency -Restart vitamin D replacement  5. Prostate cancer [facility]) -Follow-up with urology as planned  6. CKD (chronic kidney disease) stage  3, GFR 30-59 ml/min (HCC) -Continue to avoid all NSAIDs and only take Tylenol if needed for pain  7. Colostomy status ([place]) -No problems with colostomy which was done secondary to colon cancer over 30 years ago.  8. Renal cell carcinoma, right ([place]) -Follow-up with urology as planned  9. Peripheral vascular insufficiency (HCC) -Continue to walk and exercise as much as possible  Patient Instructions                       Medicare Annual Wellness Visit  [facility] and the medical providers at [facility] strive to bring you the best medical care.  In doing so we not only want to address your current medical conditions and concerns but also to detect new conditions early and prevent illness, disease and health-related problems.    Medicare offers a yearly Wellness Visit which allows our clinical staff to assess your need for preventative services including immunizations, lifestyle education, counseling to decrease risk of preventable diseases and screening for fall risk and other medical concerns.    This visit is provided free of charge (no copay) for all Medicare recipients. The clinical pharmacists at [facility] have begun to conduct these Wellness Visits which will also include a thorough review of all your medications.    As you primary medical provider recommend that you make an appointment for your Annual Wellness Visit if you have not done so already this year.  You may set up this appointment before you leave today or you may call back ([phone]) and schedule an appointment.  Please make sure when you call that you mention that you are scheduling your Annual Wellness Visit with the clinical pharmacist so that the appointment may be made for the proper length of time.     Continue current medications. Continue good therapeutic lifestyle changes which include good diet and exercise. Fall precautions discussed with patient. If an FOBT was  given today- please return it to our front desk. If you are over 49 years old - you may need Prevnar 58 or the adult Pneumonia vaccine.  **Flu shots are available--- please call and schedule a FLU-CLINIC appointment**  After your visit with [place] today you will receive a survey in the mail or online from [employer] regarding your care with [place]. Please take a moment to fill this out. Your feedback is very important to [place] as you can help [place] better understand your patient needs as well as improve your experience and satisfaction. WE CARE ABOUT YOU!!!  Continue to follow-up with urology and cardiology  as planned Stay is active physically as possible and drink plenty of fluids Also return for annual wellness visit Keep appointment for checking eyes   Arrie Senate MD

## 2017-08-11 NOTE — Patient Instructions (Addendum)
Medicare Annual Wellness Visit  [facility] and the medical providers at [facility] strive to bring you the best medical care.  In doing so we not only want to address your current medical conditions and concerns but also to detect new conditions early and prevent illness, disease and health-related problems.    Medicare offers a yearly Wellness Visit which allows our clinical staff to assess your need for preventative services including immunizations, lifestyle education, counseling to decrease risk of preventable diseases and screening for fall risk and other medical concerns.    This visit is provided free of charge (no copay) for all Medicare recipients. The clinical pharmacists at [facility] have begun to conduct these Wellness Visits which will also include a thorough review of all your medications.    As you primary medical provider recommend that you make an appointment for your Annual Wellness Visit if you have not done so already this year.  You may set up this appointment before you leave today or you may call back ([phone]) and schedule an appointment.  Please make sure when you call that you mention that you are scheduling your Annual Wellness Visit with the clinical pharmacist so that the appointment may be made for the proper length of time.     Continue current medications. Continue good therapeutic lifestyle changes which include good diet and exercise. Fall precautions discussed with patient. If an FOBT was given today- please return it to our front desk. If you are over 18 years old - you may need Prevnar 75 or the adult Pneumonia vaccine.  **Flu shots are available--- please call and schedule a FLU-CLINIC appointment**  After your visit with [place] today you will receive a survey in the mail or online from [employer] regarding your care with [place]. Please take a moment to fill this out. Your feedback is very  important to [place] as you can help [place] better understand your patient needs as well as improve your experience and satisfaction. WE CARE ABOUT YOU!!!  Continue to follow-up with urology and cardiology as planned Stay is active physically as possible and drink plenty of fluids Also return for annual wellness visit Keep appointment for checking eyes

## 2017-08-12 ENCOUNTER — Encounter: Payer: Self-pay | Admitting: Cardiology

## 2017-08-18 ENCOUNTER — Ambulatory Visit (INDEPENDENT_AMBULATORY_CARE_PROVIDER_SITE_OTHER): Payer: Medicare Other | Admitting: *Deleted

## 2017-08-18 VITALS — BP 153/92 | HR 59 | Temp 96.8°F | Ht 67.0 in | Wt 166.0 lb

## 2017-08-18 DIAGNOSIS — Z0000 Encounter for general adult medical examination without abnormal findings: Secondary | ICD-10-CM

## 2017-08-18 NOTE — Progress Notes (Signed)
Subjective:   Frederick Davis is a 83 y.o. male who presents for Medicare Annual/Subsequent preventive examination. 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 He enjoys golf and spending time with his wife whom he lives with here in [place]. They attend church regularly. He states that they eat healthy meals at home and he tried to exercise when he can. He has a very pain knee that slows him down. They have 6 children by separate marriages and all of them live out of town. He is aware of fall hazards and they do not have any pets for safety reasons. He states that his health is better than it was a year ago, with the exception of his knee.        Objective:    Vitals: BP (!) 153/92   Pulse (!) 59   Temp (!) 96.8 F (36 C) (Oral)   Ht 5\' 7"  (1.702 m)   Wt 166 lb (75.3 kg)   BMI 26.00 kg/m   Body mass index is 26 kg/m.  Tobacco History  Smoking Status  . Former Smoker  . Packs/day: 0.50  . Years: 10.00  . Types: Cigarettes  . Start date: 10/21/1946  . Quit date: 10/21/1968  Smokeless Tobacco  . Never Used     Counseling given: Not Answered no smoking since 1970.  Past Medical History:  Diagnosis Date  . Anemia   . Aortic stenosis    a. mild-mod by TEE 11/2015.  . Arthritis    left knee   . CAD (coronary artery disease)    a. RCA thrombectomy 2014. b. CABGx3 in 11/2015 (EF 45-50% in 11/2015).  . Chronic renal insufficiency, stage III (moderate) (HCC)   . Esophageal stricture   . Hypertension   . Hypothyroidism   . Kidney stone 1969  . MI (myocardial infarction) ([place]) 2017  . Mitral regurgitation    a. mild by TEE 11/2015.  Marland Kitchen Myocardial infarction ([place]) 03/12/13   Inferior MI.  LAD mid 80% stenosis, circ mild plaque, RCA 100% with thrombectomy PTCA.  EF 65%  . Osteoporosis   . PC (prostate cancer) ([place]) 1987  . RBBB   . Rectum cancer ([place]) 1986  . Renal cell cancer ([place]) 2005   Past Surgical History:  Procedure  Laterality Date  . CARDIAC CATHETERIZATION N/A 12/06/2015   Procedure: Left Heart Cath and Coronary Angiography;  Surgeon: Burnell Blanks, MD;  Location: [facility] CV LAB;  Service: Cardiovascular;  Laterality: N/A;  . COLONOSCOPY  01/08/2012   Procedure: COLONOSCOPY;  Surgeon: Rogene Houston, MD;  [facility];  Service: Endoscopy;  Laterality: N/A;  1030  . COLONOSCOPY N/A 07/20/2015   Procedure: COLONOSCOPY;  Surgeon: Rogene Houston, MD;  [facility];  Service: Endoscopy;  Laterality: N/A;  930 - moved to 9/29 @ 2:00  . COLOSTOMY Left 1986   colon cancer  . CORONARY ARTERY BYPASS GRAFT N/A 12/08/2015   Procedure: CORONARY ARTERY BYPASS GRAFTING (CABG);  Surgeon: Melrose Nakayama, MD;  Location: [facility];  Service: Open Heart Surgery;  Laterality: N/A;  Times 3 using left internal mammary artery and endoscopycally harvested bilateral saphenous vein.  Marland Kitchen HERNIA REPAIR     2007  . KIDNEY SURGERY  2004  . LEFT HEART CATHETERIZATION WITH CORONARY ANGIOGRAM N/A 03/12/2013   Procedure: STEMI;  Surgeon: Burnell Blanks, MD;  Location[facility] CATH LAB;  Service: Cardiovascular;  Laterality: N/A;  . PERCUTANEOUS CORONARY INTERVENTION-BALLOON ONLY    . PROSTATECTOMY    . [place]  2006  . renal sphincter appliance  2007  . Right inguinal hernia repair    . TEE WITHOUT CARDIOVERSION N/A 12/08/2015   Procedure: TRANSESOPHAGEAL ECHOCARDIOGRAM (TEE);  Surgeon: Melrose Nakayama, MD;  Location: [facility];  Service: Open Heart Surgery;  Laterality: N/A;   Family History  Problem Relation Age of Onset  . Stroke Mother 1  . AAA (abdominal aortic aneurysm) Father 47       died from rupture at 1 yo  . Arthritis Father   . Parkinson's disease Sister   . Cancer Son        prostate cancer  . Hypertension Son    History  Sexual Activity  . Sexual activity: Not on file    Outpatient Encounter Prescriptions as of 08/18/2017  Medication Sig  .  acetaminophen (TYLENOL) 500 MG tablet Take 500 mg by mouth at bedtime.  Marland Kitchen amLODipine (NORVASC) 2.5 MG tablet Take 1 tablet (2.5 mg total) by mouth daily.  Marland Kitchen aspirin 81 MG tablet Take 1 tablet (81 mg total) by mouth daily. (Patient taking differently: Take 81 mg by mouth every evening. )  . carvedilol (COREG) 3.125 MG tablet TAKE  (1)  TABLET TWICE A DAY WITH MEALS (BREAKFAST AND SUPPER)  . furosemide (LASIX) 20 MG tablet Take 1 tablet daily as needed. (Patient taking differently: Take one tablet by mouth once every other day)  . levothyroxine (SYNTHROID, LEVOTHROID) 75 MCG tablet Take 75 mcg by mouth daily before breakfast.  . nitroGLYCERIN (NITROSTAT) 0.4 MG SL tablet PLACE 1 TABLET UNDER THE TONGUE AT ONSET OF CHEST PAIN EVERY 5 MINTUES UP TO 3 TIMES AS NEEDED   Facility-Administered Encounter Medications as of 08/18/2017  Medication  . etomidate (AMIDATE) injection  . lidocaine (cardiac) 100 mg/51ml (XYLOCAINE) 20 MG/ML injection 2%  . succinylcholine (ANECTINE) injection    Activities of Daily Living In your present state of health, do you have any difficulty performing the following activities: 08/18/2017 05/28/2017  Hearing? N Y  Vision? Y N  Comment wears reading glasses  -  Difficulty concentrating or making decisions? N N  Walking or climbing stairs? Y N  Comment yes = arthritis in knees  -  Dressing or bathing? N N  Doing errands, shopping? N N  Some recent data might be hidden    Patient Care Team: Chipper Herb, MD as PCP - General (Family Medicine) Burman Foster, MD as Physician Assistant (Urology) Danella Sensing, MD as Consulting Physician (Dermatology) Gaynelle Arabian, MD as Consulting Physician (Orthopedic Surgery) Rogene Houston, MD as Consulting Physician (Gastroenterology)   Assessment:    Exercise Activities and Dietary recommendations    Goals    . Prevent Falls          And CONTINUE healthy lifestyle       Fall Risk Fall Risk  08/18/2017 08/11/2017  06/11/2017 05/29/2017 04/25/2017  Falls in the past year? No No No No No  Number falls in past yr: - - - - -  Injury with Fall? - - - - -  Risk for fall due to : - - - - -  Follow up - - - - -   Depression Screen PHQ 2/9 Scores 08/18/2017 08/11/2017 06/11/2017 05/29/2017  PHQ - 2 Score 0 0 0 0  PHQ- 9 Score - - - -    Cognitive Function MMSE - Mini  Mental State Exam 08/18/2017  Orientation to time 5  Orientation to Place 5  Registration 3  Attention/ Calculation 5  Recall 2  Language- name 2 objects 2  Language- repeat 1  Language- follow 3 step command 3  Language- read & follow direction 1  Write a sentence 1  Copy design 1  Total score 29        Immunization History  Administered Date(s) Administered  . Influenza Whole 08/13/2012  . Influenza, High Dose Seasonal PF 08/06/2017  . Influenza,inj,Quad PF,6+ Mos 08/12/2013, 08/09/2014, 09/27/2015, 07/30/2016  . Pneumococcal Conjugate-13 12/28/2013  . Pneumococcal Polysaccharide-23 10/21/1990  . Zoster 07/08/2011   Screening Tests Health Maintenance  Topic Date Due  . TETANUS/TDAP  08/21/2018  . INFLUENZA VACCINE  Completed  . PNA vac Low Risk Adult  Completed      Plan:    He will Keep follow up appointment with Dr Laurance Flatten and other specialist He will bring by a copy of his advanced directives. We discussed the need for TDAP next year - due 08/2018 I have personally reviewed and noted the following in the patient's chart:   . Medical and social history . Use of alcohol, tobacco or illicit drugs  . Current medications and supplements . Functional ability and status . Nutritional status . Physical activity . Advanced directives . List of other physicians . Hospitalizations, surgeries, and ER visits in previous 12 months . Vitals . Screenings to include cognitive, depression, and falls . Referrals and appointments  In addition, I have reviewed and discussed with patient certain preventive protocols, quality  metrics, and best practice recommendations. A written personalized care plan for preventive services as well as general preventive health recommendations were provided to patient.     Agustina Witzke, Cameron Proud, LPN  16/07/9603  I have reviewed and agree with the above AWV documentation.   Mary-Margaret Hassell Done, FNP

## 2017-08-18 NOTE — Patient Instructions (Signed)
  Mr. Frederick Davis , Thank you for taking time to come for your Medicare Wellness Visit. I appreciate your ongoing commitment to your health goals. Please review the following plan we discussed and let me know if I can assist you in the future.   These are the goals we discussed: Goals    . Prevent Falls          And CONTINUE healthy lifestyle        This is a list of the screening recommended for you and due dates:  Health Maintenance  Topic Date Due  . Tetanus Vaccine  08/21/2018  . Flu Shot  Completed  . Pneumonia vaccines  Completed   Keep follow up appointment with Dr Laurance Flatten and other specialist Please bring by a copy of your advanced directives.

## 2017-08-24 NOTE — Progress Notes (Signed)
Dictation #1 [id number]  [id number]     HPI The patient presents for followup after a acute inferior wall infarct in May of 2014. He had thrombectomy of a right coronary artery occlusion. He was initially acute respiratory failure and was cooled. However, he responded very well to therapy.   Last year he had chest pain.  He was sent for cardiac catheterization. This demonstrated severe LAD stenosis and RCA stenosis. He subsequently underwent a CABG 3 on February 2017.  He was observed overnight in any pain in August because of chest pain.  He followed up in our office and was doing relatively well.  He has been doing okay since that time.  He denies any chest discomfort, neck or arm discomfort.  He has had no palpitations, presyncope or syncope.  He still gets in 9 holes occasionally.  Allergies  Allergen Reactions  . Lipitor [Atorvastatin] Other (See Comments)    Muscle aches   Prior to Admission medications   Medication Sig Start Date End Date Taking? Authorizing Provider  acetaminophen (TYLENOL) 500 MG tablet Take 500 mg by mouth at bedtime.   Yes [provider]  amLODipine (NORVASC) 2.5 MG tablet Take 1 tablet (2.5 mg total) by mouth daily. 04/21/17  Yes Minus Breeding, MD  aspirin 81 MG tablet Take 1 tablet (81 mg total) by mouth daily. Patient taking differently: Take 81 mg by mouth every evening.  03/18/13  Yes Barrett, Evelene Croon, PA-C  carvedilol (COREG) 3.125 MG tablet TAKE  (1)  TABLET TWICE A DAY WITH MEALS (BREAKFAST AND SUPPER) 07/24/17  Yes Chipper Herb, MD  furosemide (LASIX) 20 MG tablet Take 1 tablet daily as needed. Patient taking differently: Take one tablet by mouth once every other day 04/07/17  Yes Xuan Mateus, Jeneen Rinks, MD  levothyroxine (SYNTHROID, LEVOTHROID) 75 MCG tablet Take 75 mcg by mouth daily before breakfast.   Yes [provider]  Multiple Vitamin (MULTIVITAMIN) capsule Take 1 capsule daily by mouth.   Yes [provider]    nitroGLYCERIN (NITROSTAT) 0.4 MG SL tablet PLACE 1 TABLET UNDER THE TONGUE AT ONSET OF CHEST PAIN EVERY 5 MINTUES UP TO 3 TIMES AS NEEDED 05/30/17  Yes Chipper Herb, MD    Past Medical History:  Diagnosis Date  . Anemia   . Aortic stenosis    a. mild-mod by TEE 11/2015.  . Arthritis    left knee   . CAD (coronary artery disease)    a. RCA thrombectomy 2014. b. CABGx3 in 11/2015 (EF 45-50% in 11/2015).  . Chronic renal insufficiency, stage III (moderate) (HCC)   . Esophageal stricture   . Hypertension   . Hypothyroidism   . Kidney stone 1969  . MI (myocardial infarction) ([place]) 2017  . Mitral regurgitation    a. mild by TEE 11/2015.  Marland Kitchen Myocardial infarction ([place]) 03/12/13   Inferior MI.  LAD mid 80% stenosis, circ mild plaque, RCA 100% with thrombectomy PTCA.  EF 65%  . Osteoporosis   . PC (prostate cancer) ([place]) 1987  . RBBB   . Rectum cancer ([place]) 1986  . Renal cell cancer ([place]) 2005    Past Surgical History:  Procedure Laterality Date  . COLOSTOMY Left 1986   colon cancer  . HERNIA REPAIR     2007  . KIDNEY SURGERY  2004  . PERCUTANEOUS CORONARY INTERVENTION-BALLOON ONLY    . PROSTATECTOMY    . [place]  2006  . renal sphincter appliance  2007  . Right  inguinal hernia repair      ROS:   As stated in the HPI and negative for all other systems.  PHYSICAL EXAM BP 120/78   Pulse 68   Ht 5\' 5"  (1.651 m)   Wt 168 lb (76.2 kg)   BMI 27.96 kg/m   GENERAL:  Well appearing NECK:  No jugular venous distention, waveform within normal limits, carotid upstroke brisk and symmetric, no bruits, no thyromegaly LUNGS:  Clear to auscultation bilaterally CHEST:  Well healed sternotomy scar. HEART:  PMI not displaced or sustained,S1 and S2 within normal limits, no S3, no S4, no clicks, no rubs, 3 out of 6 apical systolic murmur radiating slightly at the aortic outflow tract, no diastolic mild bilateral ankle murmurs ABD:  Flat, positive bowel sounds normal in  frequency in pitch, no bruits, no rebound, no guarding, no midline pulsatile mass, no hepatomegaly, no splenomegaly EXT:  2 plus pulses throughout, mild bilateral ankle edema, no cyanosis no clubbing     ASSESSMENT AND PLAN  CAD/CABG:   The patient has no new sypmtoms.  No further cardiovascular testing is indicated.  We will continue with aggressive risk reduction and meds as listed.  HTN:  The blood pressure is at target.  No change in therapy is indicated. ed.    DYSLIPIDEMIA:  He has been intolerant of 1 statin in the past.  His LDL was 71 and HDL 60.  No change in therapy.   AORTIC STENOSIS:  This is moderate.  I will follow this clinically.   EDEMA:  This is mild.  No change in therapy.

## 2017-08-25 ENCOUNTER — Telehealth: Payer: Self-pay | Admitting: Family Medicine

## 2017-08-25 DIAGNOSIS — R39198 Other difficulties with micturition: Secondary | ICD-10-CM

## 2017-08-25 NOTE — Telephone Encounter (Signed)
Patient is requesting referral to urologist due to slow flow. Patient is requesting to go to [facility].

## 2017-08-25 NOTE — Telephone Encounter (Signed)
Please make an appointment with [facility] for evaluation of patient's symptoms and treatment

## 2017-08-25 NOTE — Telephone Encounter (Signed)
Patient aware referral placed.

## 2017-08-27 ENCOUNTER — Ambulatory Visit (INDEPENDENT_AMBULATORY_CARE_PROVIDER_SITE_OTHER): Payer: Medicare Other | Admitting: Cardiology

## 2017-08-27 ENCOUNTER — Encounter: Payer: Self-pay | Admitting: Cardiology

## 2017-08-27 VITALS — BP 120/78 | HR 68 | Ht 65.0 in | Wt 168.0 lb

## 2017-08-27 DIAGNOSIS — I10 Essential (primary) hypertension: Secondary | ICD-10-CM

## 2017-08-27 DIAGNOSIS — I350 Nonrheumatic aortic (valve) stenosis: Secondary | ICD-10-CM | POA: Diagnosis not present

## 2017-08-27 DIAGNOSIS — I2510 Atherosclerotic heart disease of native coronary artery without angina pectoris: Secondary | ICD-10-CM

## 2017-08-27 DIAGNOSIS — E785 Hyperlipidemia, unspecified: Secondary | ICD-10-CM | POA: Diagnosis not present

## 2017-08-27 NOTE — Patient Instructions (Signed)
Medication Instructions:  The current medical regimen is effective;  continue present plan and medications.  Follow-Up: Follow up in 6 months with Dr. Hochrein.  You will receive a letter in the mail 2 months before you are due.  Please call us when you receive this letter to schedule your follow up appointment.  If you need a refill on your cardiac medications before your next appointment, please call your pharmacy.  Thank you for choosing [facility]!!       

## 2017-10-10 ENCOUNTER — Other Ambulatory Visit: Payer: Self-pay | Admitting: Cardiology

## 2017-10-10 IMAGING — CR DG CHEST 2V
2 series · 2 of 2 positions shown · non-contrast
Comparison: 10/19/2013.

CLINICAL DATA: Hypertension.

EXAM:
CHEST  2 VIEW

[view not recorded (1 of 2)]
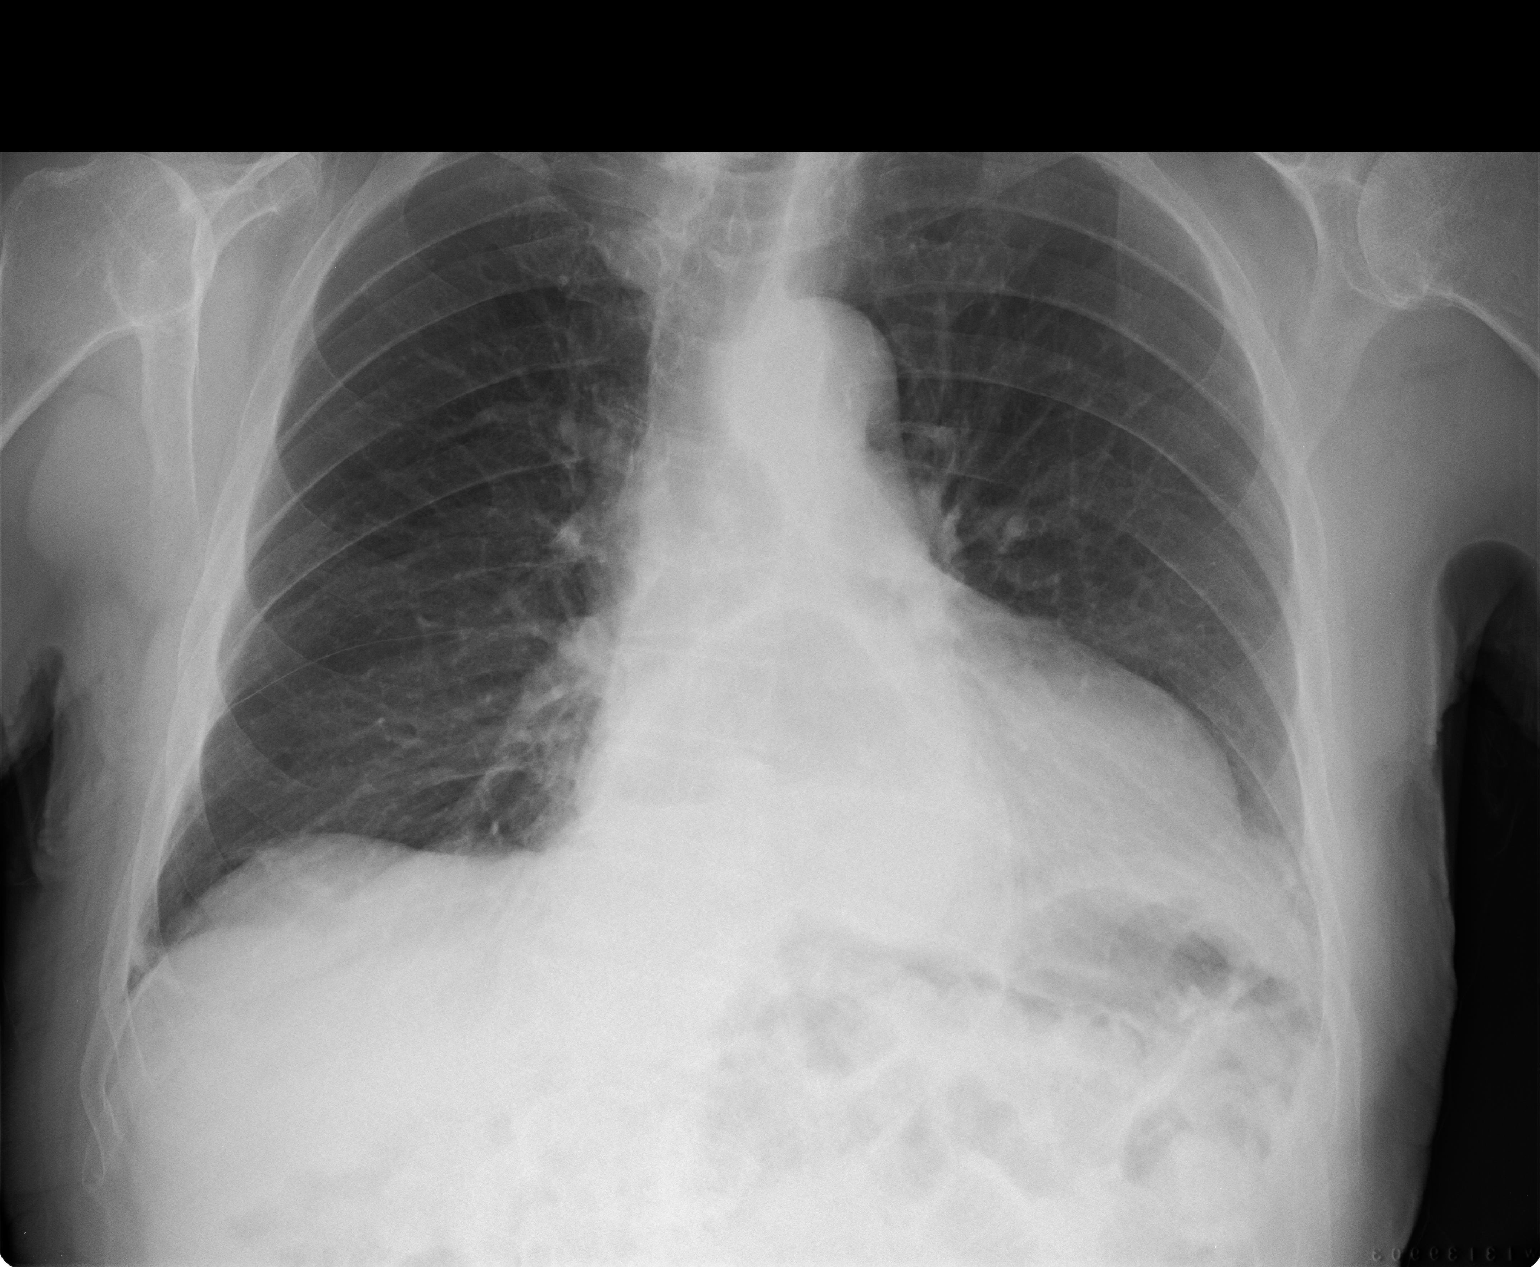

[view not recorded (2 of 2)]
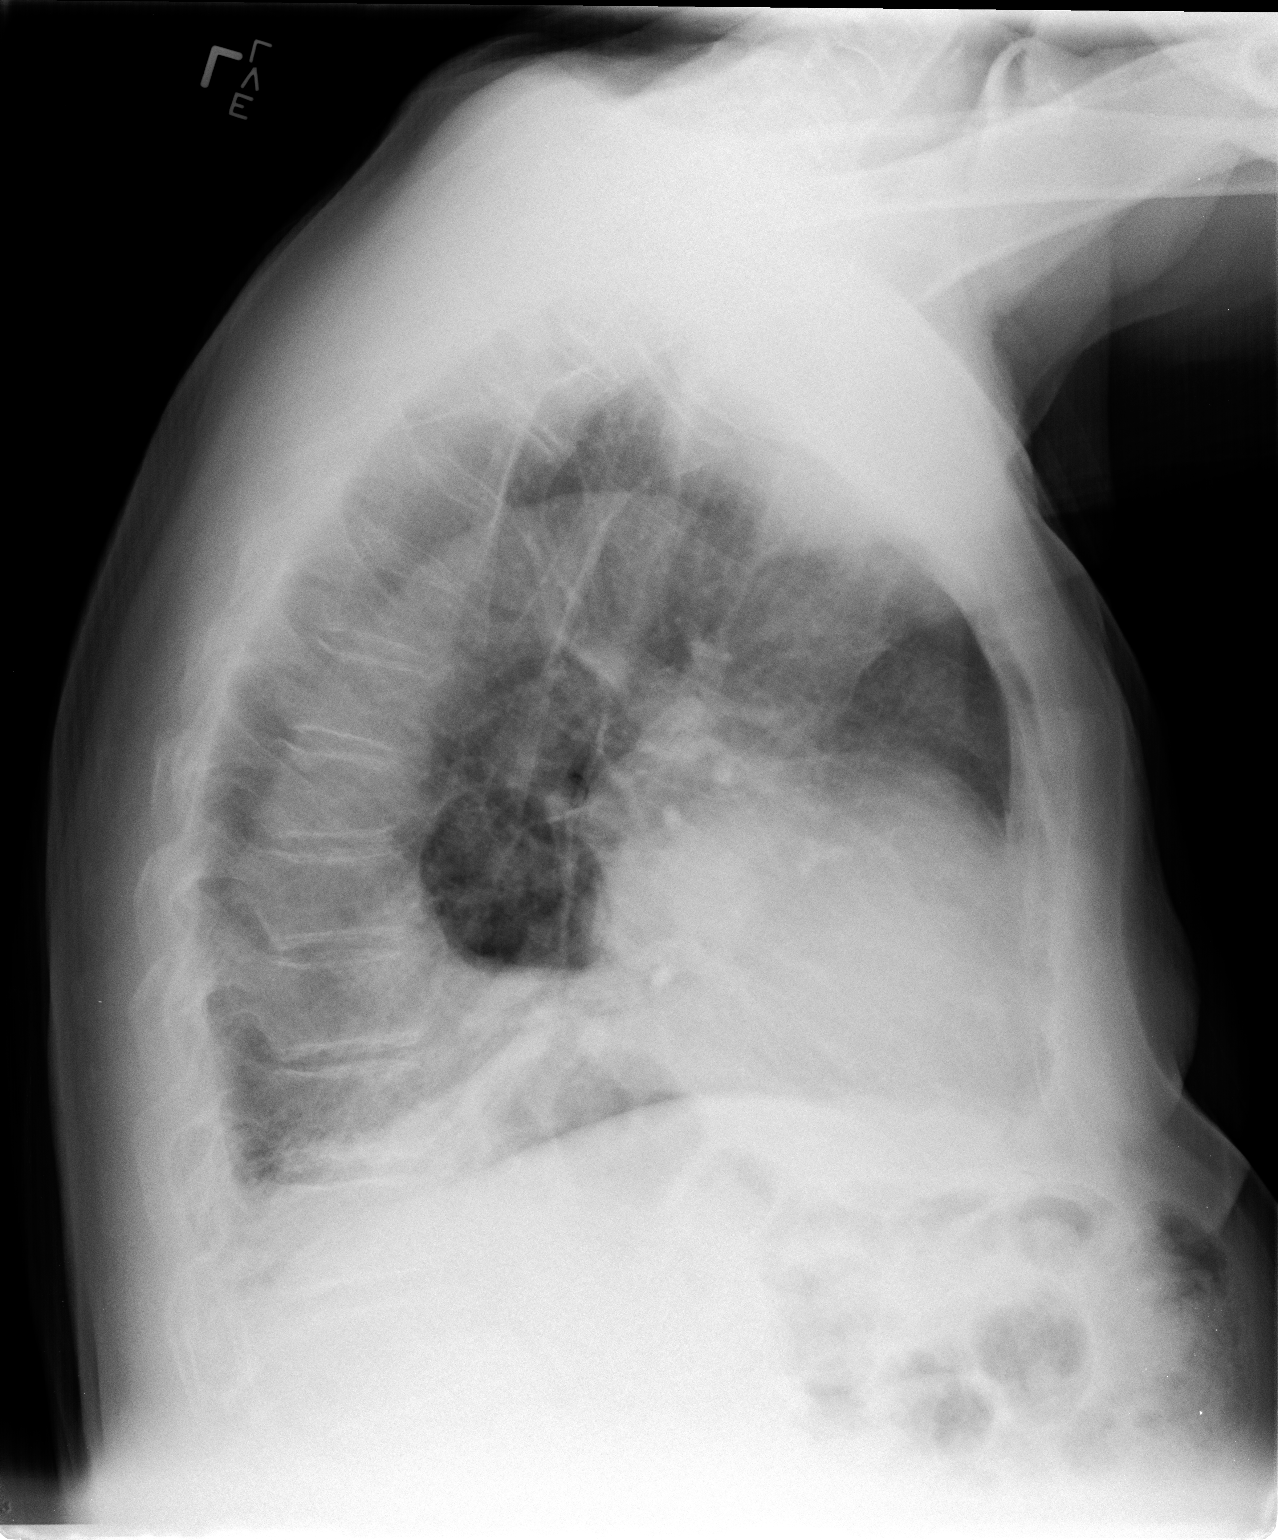

[2 of 2 positions shown; findings below may reference images not displayed]

FINDINGS: Mediastinum and hilar structures normal. Left base subsegmental
atelectasis and/or scar. Sliding hiatal hernia. Degenerative change
thoracic spine.
IMPRESSION: 1. Left base subsegmental atelectasis and/or scarring.
2. Sliding hiatal hernia.  Chest is stable prior exam.

## 2017-10-13 ENCOUNTER — Other Ambulatory Visit: Payer: Self-pay | Admitting: Family Medicine

## 2017-10-27 DIAGNOSIS — N393 Stress incontinence (female) (male): Secondary | ICD-10-CM | POA: Diagnosis not present

## 2017-10-27 DIAGNOSIS — N5231 Erectile dysfunction following radical prostatectomy: Secondary | ICD-10-CM | POA: Diagnosis not present

## 2017-10-27 DIAGNOSIS — N3000 Acute cystitis without hematuria: Secondary | ICD-10-CM | POA: Diagnosis not present

## 2017-10-27 DIAGNOSIS — R3912 Poor urinary stream: Secondary | ICD-10-CM | POA: Diagnosis not present

## 2017-11-20 DIAGNOSIS — N3000 Acute cystitis without hematuria: Secondary | ICD-10-CM | POA: Diagnosis not present

## 2017-11-20 DIAGNOSIS — R3912 Poor urinary stream: Secondary | ICD-10-CM | POA: Diagnosis not present

## 2017-11-21 ENCOUNTER — Telehealth: Payer: Self-pay | Admitting: Cardiology

## 2017-11-21 DIAGNOSIS — R001 Bradycardia, unspecified: Secondary | ICD-10-CM

## 2017-11-21 NOTE — Telephone Encounter (Signed)
New Message    Patient states that his pulse rate readings have been abnormal. On the 27th it was 34 and on 2/1 it was 37.  He says that his BP has been good just the pulse rate has been alarming to him.  Frederick Davis states that he has had an symptoms of SOB or fatigue.

## 2017-11-21 NOTE — Telephone Encounter (Signed)
Spoke with patient who called with concerns with low HR readings. He states at urologist office yesterday, the person that checked his vital signs made a comment about it being low but never told the patient what the reading was. He monitors BP and pulse at home with home equipment. He states pulse was 34 bpm when he woke up today and then 2 hours later it is 67 bpm. He reports the following readings:  1/27 BP 147/91 mmHg, HR 34, then 62, then 66 2/1 BP 126/66 mmHg, HR 34 bpm then 37 bpm He verified that he takes carvedilol 3.125 mg BID and has taken his am dose already today. He denies dizziness, light-headedness, or fatigue. I advised that I will route message to Dr. Percival Spanish and his primary nurse and that someone from our office will call him back with Dr. Rosezella [place] advice. The patient verbalized understanding and agreement with plan and thanked me for the call.

## 2017-11-21 NOTE — Telephone Encounter (Signed)
Order 24 hour Holter.

## 2017-11-21 NOTE — Telephone Encounter (Signed)
Spoke with patient who is aware Dr Percival Spanish ordered for him to wear a 24 hr holter.  Order placed and appt scheduled for Tuesday 2/5 at 12:45 pm at [facility].  Pt aware to report to the main entrance of [facility].  For now he will continue medications as listed.  If he should develop s/s over the weekend he will call to speak with the MD on call.  He was appreciative of the call and information.

## 2017-11-25 ENCOUNTER — Ambulatory Visit (HOSPITAL_COMMUNITY)
Admission: RE | Admit: 2017-11-25 | Discharge: 2017-11-25 | Disposition: A | Discharge status: Home / Self Care | Payer: Medicare Other | Source: Ambulatory Visit | Attending: Cardiology | Admitting: Cardiology

## 2017-11-25 DIAGNOSIS — R001 Bradycardia, unspecified: Secondary | ICD-10-CM | POA: Insufficient documentation

## 2017-11-28 ENCOUNTER — Telehealth: Payer: Self-pay | Admitting: Cardiology

## 2017-11-28 NOTE — Telephone Encounter (Signed)
Pt wants to know if his Monitor results are ready yet?

## 2017-11-28 NOTE — Telephone Encounter (Signed)
Patient aware results are pending - turnaround time is about 1 week for holter monitor results

## 2017-12-03 DIAGNOSIS — N5201 Erectile dysfunction due to arterial insufficiency: Secondary | ICD-10-CM | POA: Diagnosis not present

## 2017-12-03 DIAGNOSIS — T83490A Other mechanical complication of penile (implanted) prosthesis, initial encounter: Secondary | ICD-10-CM | POA: Diagnosis not present

## 2017-12-03 DIAGNOSIS — R3912 Poor urinary stream: Secondary | ICD-10-CM | POA: Diagnosis not present

## 2017-12-04 ENCOUNTER — Telehealth: Payer: Self-pay | Admitting: *Deleted

## 2017-12-04 ENCOUNTER — Telehealth: Payer: Self-pay | Admitting: Cardiology

## 2017-12-04 NOTE — Telephone Encounter (Signed)
APPT MADE FOR  12-08-17 AT 9:30 AM WITH HOA MENG  PA   CLEARANCE PENDING

## 2017-12-04 NOTE — Telephone Encounter (Signed)
Spoke with pt, he is coming to see hao meng pa on Monday for surgical clearance. He would like to know if dr hochrein thinks he would be able to handle the surgery. Patient made aware dr Michelle Piper is out of the office until the first of the week and will send this for his review. Pt agreed with this plan. Forwarded to dr hochrein

## 2017-12-04 NOTE — Telephone Encounter (Signed)
   Primary Cardiologist:James Hochrein, MD  Chart reviewed as part of pre-operative protocol coverage. Recently has rhythm issue, holter monitor did not showed any arrhthymias.  3 days ago his diuretics increased due to volume overload. He needs to be seen prior to clearance.   Pre-op covering staff: - Please schedule appointment and call patient to inform them. - Please contact requesting surgeon's office via preferred method (i.e, phone, fax) to inform them of need for appointment prior to surgery.  [place], [place]  12/04/2017, 3:25 PM

## 2017-12-04 NOTE — Telephone Encounter (Signed)
   [facility] Pre-operative Risk Assessment    Request for surgical clearance:  1. What type of surgery is being performed? EXPLANT OF ERODED PENILE PROSTHESIS    2. When is this surgery scheduled? ASAP PENDING CLEARANCE   3. What type of clearance is required (medical clearance vs. Pharmacy clearance to hold med vs. Both)? BOTH  4. Are there any medications that need to be held prior to surgery and how long?ASPIRIN- 5 DAYS PRIOR TO PROCEDURE   5. Practice name and name of physician performing surgery? DR Annie Main [facility]   6. What is your office phone and fax number? PH=[phone]  FAX=[phone]   7. Anesthesia type (None, local, MAC, general) ? UNKNOWN   Fredia Beets 12/04/2017, 11:52 AM  _________________________________________________________________   (provider comments below)

## 2017-12-04 NOTE — Telephone Encounter (Signed)
New message    Patient has additional questions. Patient requesting to speak with nurse. Please call

## 2017-12-04 NOTE — Telephone Encounter (Signed)
THIS MESSAGE WAS SENT TO DR Diona Fanti VIA ROUTING .Adonis Housekeeper

## 2017-12-07 NOTE — Telephone Encounter (Signed)
I will review the note from [place].  Also he can call me wen he sees him in clinic.

## 2017-12-08 ENCOUNTER — Ambulatory Visit (INDEPENDENT_AMBULATORY_CARE_PROVIDER_SITE_OTHER): Payer: Medicare Other | Admitting: Physician Assistant

## 2017-12-08 ENCOUNTER — Encounter: Payer: Self-pay | Admitting: Physician Assistant

## 2017-12-08 VITALS — BP 122/84 | HR 65 | Ht 65.0 in | Wt 171.0 lb

## 2017-12-08 DIAGNOSIS — E039 Hypothyroidism, unspecified: Secondary | ICD-10-CM | POA: Diagnosis not present

## 2017-12-08 DIAGNOSIS — Z9861 Coronary angioplasty status: Secondary | ICD-10-CM

## 2017-12-08 DIAGNOSIS — I10 Essential (primary) hypertension: Secondary | ICD-10-CM | POA: Diagnosis not present

## 2017-12-08 DIAGNOSIS — I2510 Atherosclerotic heart disease of native coronary artery without angina pectoris: Secondary | ICD-10-CM | POA: Diagnosis not present

## 2017-12-08 DIAGNOSIS — I4510 Unspecified right bundle-branch block: Secondary | ICD-10-CM | POA: Diagnosis not present

## 2017-12-08 DIAGNOSIS — Z01810 Encounter for preprocedural cardiovascular examination: Secondary | ICD-10-CM

## 2017-12-08 DIAGNOSIS — I350 Nonrheumatic aortic (valve) stenosis: Secondary | ICD-10-CM | POA: Diagnosis not present

## 2017-12-08 NOTE — Patient Instructions (Addendum)
Medication Instructions:  Your physician recommends that you continue on your current medications as directed. Please refer to the Current Medication list given to you today.  Labwork: None   Testing/Procedures: None   Follow-Up: Your physician recommends that you schedule a follow-up appointment in: 3-4 months with Dr Percival Spanish.  Any Other Special Instructions Will Be Listed Below (If Applicable).  Frederick Davis will speak with Dr Percival Spanish before giving you clearance for your upcoming surgery HOLD Aspirin for 5 days before surgery and start IMMEDIATELY after surgery ASAP.  If you need a refill on your cardiac medications before your next appointment, please call your pharmacy.
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
## 2017-12-09 ENCOUNTER — Encounter: Payer: Self-pay | Admitting: Physician Assistant

## 2017-12-10 ENCOUNTER — Other Ambulatory Visit: Payer: Self-pay | Admitting: Urology

## 2017-12-10 ENCOUNTER — Encounter: Payer: Self-pay | Admitting: Family Medicine

## 2017-12-10 ENCOUNTER — Ambulatory Visit (INDEPENDENT_AMBULATORY_CARE_PROVIDER_SITE_OTHER): Payer: Medicare Other | Admitting: Family Medicine

## 2017-12-10 VITALS — BP 129/80 | HR 55 | Temp 96.6°F | Ht 64.5 in | Wt 169.0 lb

## 2017-12-10 DIAGNOSIS — N183 Chronic kidney disease, stage 3 unspecified: Secondary | ICD-10-CM

## 2017-12-10 DIAGNOSIS — C61 Malignant neoplasm of prostate: Secondary | ICD-10-CM

## 2017-12-10 DIAGNOSIS — C641 Malignant neoplasm of right kidney, except renal pelvis: Secondary | ICD-10-CM | POA: Diagnosis not present

## 2017-12-10 DIAGNOSIS — I2510 Atherosclerotic heart disease of native coronary artery without angina pectoris: Secondary | ICD-10-CM

## 2017-12-10 DIAGNOSIS — Z951 Presence of aortocoronary bypass graft: Secondary | ICD-10-CM

## 2017-12-10 DIAGNOSIS — Z933 Colostomy status: Secondary | ICD-10-CM

## 2017-12-10 DIAGNOSIS — T83410A Breakdown (mechanical) of penile (implanted) prosthesis, initial encounter: Secondary | ICD-10-CM

## 2017-12-10 DIAGNOSIS — E559 Vitamin D deficiency, unspecified: Secondary | ICD-10-CM

## 2017-12-10 DIAGNOSIS — I739 Peripheral vascular disease, unspecified: Secondary | ICD-10-CM

## 2017-12-10 DIAGNOSIS — C20 Malignant neoplasm of rectum: Secondary | ICD-10-CM | POA: Diagnosis not present

## 2017-12-10 DIAGNOSIS — I10 Essential (primary) hypertension: Secondary | ICD-10-CM | POA: Diagnosis not present

## 2017-12-10 DIAGNOSIS — E7800 Pure hypercholesterolemia, unspecified: Secondary | ICD-10-CM | POA: Diagnosis not present

## 2017-12-10 NOTE — Patient Instructions (Signed)
Medicare Annual Wellness Visit  [facility] and the medical providers at [facility] strive to bring you the best medical care.  In doing so we not only want to address your current medical conditions and concerns but also to detect new conditions early and prevent illness, disease and health-related problems.    Medicare offers a yearly Wellness Visit which allows our clinical staff to assess your need for preventative services including immunizations, lifestyle education, counseling to decrease risk of preventable diseases and screening for fall risk and other medical concerns.    This visit is provided free of charge (no copay) for all Medicare recipients. The clinical pharmacists at [facility] have begun to conduct these Wellness Visits which will also include a thorough review of all your medications.    As you primary medical provider recommend that you make an appointment for your Annual Wellness Visit if you have not done so already this year.  You may set up this appointment before you leave today or you may call back ([phone]) and schedule an appointment.  Please make sure when you call that you mention that you are scheduling your Annual Wellness Visit with the clinical pharmacist so that the appointment may be made for the proper length of time.     Continue current medications. Continue good therapeutic lifestyle changes which include good diet and exercise. Fall precautions discussed with patient. If an FOBT was given today- please return it to our front desk. If you are over 50 years old - you may need Prevnar 13 or the adult Pneumonia vaccine.  **Flu shots are available--- please call and schedule a FLU-CLINIC appointment**  After your visit with us today you will receive a survey in the mail or online from [employer] regarding your care with us. Please take a moment to fill this out. Your feedback is very  important to us as you can help us better understand your patient needs as well as improve your experience and satisfaction. WE CARE ABOUT YOU!!!    

## 2017-12-10 NOTE — Progress Notes (Signed)
Subjective:    Patient ID: Frederick Davis, male    DOB: [birth year], 84 y.o.   MRN: [id number]  HPI Pt here for follow up and management of chronic medical problems which includes hypertension and hyperlipidemia. He is taking medication regularly.  Patient is doing well overall.  He is pending surgery by the urologist to remove an old penile prosthesis that is causing a breakdown here and increased inflammation.  He has been cleared by the cardiologist and he is waiting now to hear from the urologist regarding the surgery and how to take his blood thinners.  He will return to the office fasting for lab work but will be given an FOBT to return today.  She is doing incredible for his age.  He is concerned that he may be losing his memory a little bit but I can certainly not tell that during the visit today.  He is waiting to hear from the urologist to plan this removal of this prosthesis.  He denies any chest pain or shortness of breath.  He denies any trouble with his stomach including nausea vomiting diarrhea blood in the stool or black tarry bowel movements.  He has had some more problems with voiding and he associates this with the failed prosthesis.  Hospital admission and one night stay is planned whenever the surgery is okay by everyone.    Patient Active Problem List   Diagnosis Date Noted  . Renal cell carcinoma, right ([place]) 08/11/2017  . Vitamin D deficiency 08/11/2017  . ASCVD (arteriosclerotic cardiovascular disease) 08/11/2017  . Aortic stenosis 05/28/2017  . Mitral regurgitation 05/28/2017  . Chest pain 05/27/2017  . Coronary artery disease involving native coronary artery of native heart without angina pectoris 03/19/2017  . Dyslipidemia 03/19/2017  . S/P CABG x 3 12/08/2015  . Unstable angina ([place])   . Chest pain with moderate risk of acute coronary syndrome 12/05/2015  . Aortic stenosis, mild-2014 12/05/2015  . Anginal pain ([place]) 10/17/2015  . History of renal cell  cancer-s/p nephrectomy 07/19/2013  . Hyperlipidemia 07/19/2013  . Hypotension arterial 03/18/2013  . Complete heart block ([place]) 03/18/2013  . Anemia   . CKD (chronic kidney disease) stage 3, GFR 30-59 ml/min (HCC) 03/12/2013  . Acute MI, inferior wall, initial episode of care ([place]) 03/12/2013  . Impotence 03/12/2013  . History of renal stone 03/12/2013  . Hiatal hernia 03/12/2013  . H/O lithotripsy 03/12/2013  . SCCA (squamous cell carcinoma) of skin 03/12/2013  . S/P prostatectomy, radical 03/12/2013  . Left renal mass 03/12/2013  . Arthritis 03/12/2013  . History of STEMI- May 2014 03/12/2013  . CAD S/P RCA thrombectomy May 2014, CABG 11/2015 03/12/2013  . Cardiogenic shock ([employer]) 03/12/2013  . Acute respiratory failure ([place]) 03/12/2013  . Altered mental status 03/12/2013  . Essential hypertension, benign 01/14/2013  . Colostomy in place [facility]) 01/14/2013  . Prostate cancer ([place]) 01/14/2013  . Renal cell carcinoma, Right 01/14/2013  . Osteoporosis, unspecified 01/14/2013   Outpatient Encounter Medications as of 12/10/2017  Medication Sig  . acetaminophen (TYLENOL) 500 MG tablet Take 500 mg by mouth at bedtime.  Marland Kitchen amLODipine (NORVASC) 2.5 MG tablet Take 1 tablet (2.5 mg total) by mouth daily.  Marland Kitchen aspirin 81 MG tablet Take 1 tablet (81 mg total) by mouth daily. (Patient taking differently: Take 81 mg by mouth every evening. )  . carvedilol (COREG) 3.125 MG tablet TAKE  (1)  TABLET TWICE A DAY WITH MEALS (BREAKFAST AND SUPPER)  . furosemide (  LASIX) 20 MG tablet Take 1 tablet daily as needed.  Marland Kitchen levothyroxine (SYNTHROID, LEVOTHROID) 75 MCG tablet TAKE 1 TABLET DAILY  . Multiple Vitamin (MULTIVITAMIN) capsule Take 1 capsule daily by mouth.  . nitroGLYCERIN (NITROSTAT) 0.4 MG SL tablet PLACE 1 TABLET UNDER THE TONGUE AT ONSET OF CHEST PAIN EVERY 5 MINTUES UP TO 3 TIMES AS NEEDED   Facility-Administered Encounter Medications as of 12/10/2017  Medication  . etomidate (AMIDATE) injection    . lidocaine (cardiac) 100 mg/93m (XYLOCAINE) 20 MG/ML injection 2%  . succinylcholine (ANECTINE) injection      Review of Systems  Constitutional: Negative.   HENT: Negative.   Eyes: Negative.   Respiratory: Negative.   Cardiovascular: Negative.   Gastrointestinal: Negative.   Endocrine: Negative.   Genitourinary: Negative.   Musculoskeletal: Negative.   Skin: Negative.   Allergic/Immunologic: Negative.   Neurological: Negative.   Hematological: Negative.   Psychiatric/Behavioral: Negative.        Objective:   Physical Exam  Constitutional: He is oriented to person, place, and time. He appears well-developed and well-nourished. No distress.  The patient is elderly but pleasant and alert for his age.  HENT:  Head: Normocephalic and atraumatic.  Nose: Nose normal.  Mouth/Throat: Oropharynx is clear and moist. No oropharyngeal exudate.  He does have some ear cerumen bilaterally but we will do not do any irrigating today.  Eyes: Conjunctivae and EOM are normal. Pupils are equal, round, and reactive to light. Right eye exhibits no discharge. Left eye exhibits no discharge. No scleral icterus.  Neck: Normal range of motion. Neck supple. No thyromegaly present.  No bruits thyromegaly or anterior cervical adenopathy  Cardiovascular: Normal rate, regular rhythm and intact distal pulses.  Murmur heard. Heart was slightly irregular at 60/min with a grade 2/6 to 3/6 systolic ejection murmur.  Pulmonary/Chest: Effort normal and breath sounds normal. No respiratory distress. He has no wheezes. He has no rales. He exhibits no tenderness.  Clear anteriorly and posteriorly and no axillary adenopathy  Abdominal: Soft. Bowel sounds are normal. He exhibits no mass. There is no tenderness. There is no rebound and no guarding.  Colostomy bag in place no masses or distention.  No tenderness.  Genitourinary:  Genitourinary Comments: This patient is followed regularly by his urologist and does  have upcoming surgery planned soon.  Musculoskeletal: Normal range of motion. He exhibits no edema.  Lymphadenopathy:    He has no cervical adenopathy.  Neurological: He is alert and oriented to person, place, and time. He has normal reflexes. No cranial nerve deficit.  Skin: Skin is warm and dry. No rash noted.  Psychiatric: He has a normal mood and affect. His behavior is normal. Judgment and thought content normal.  Nursing note and vitals reviewed.  BP 129/80 (BP Location: Right Arm)   Pulse (!) 55   Temp (!) 96.6 F (35.9 C) (Oral)   Ht 5' 4.5" (1.638 m)   Wt 169 lb (76.7 kg)   BMI 28.56 kg/m        Assessment & Plan:  1. ASCVD (arteriosclerotic cardiovascular disease) -The patient is followed by the cardiologist every 6 months. - CBC with Differential/Platelet; Future - Lipid panel; Future  2. Essential hypertension, benign -Blood pressure is good today.  He will continue with current treatment. - CBC with Differential/Platelet; Future - BMP8+EGFR; Future - Hepatic function panel; Future  3. Pure hypercholesterolemia -Continue with current treatment pending results of lab work.  Current treatment for this patient mainly is diet  and exercise. - CBC with Differential/Platelet; Future - Lipid panel; Future  4. Vitamin D deficiency -The patient is currently not taking any vitamin D and we will manage this once the results are returned. - CBC with Differential/Platelet; Future - VITAMIN D 25 Hydroxy (Vit-D Deficiency, Fractures); Future  5. Prostate cancer [facility]) -Follow-up with urology as planned and currently doing. - CBC with Differential/Platelet; Future  6. CKD (chronic kidney disease) stage 3, GFR 30-59 ml/min (HCC) - CBC with Differential/Platelet; Future - BMP8+EGFR; Future  7. Renal cell carcinoma, right (HCC) -Continue to follow-up with urology  8. S/P CABG x 3 -T need to follow-up with cardiology  9. Peripheral vascular insufficiency (HCC) -Walking  exercise as much as possible  10. Colostomy in place [facility]) -No problems with GI function  11. Rectal cancer ([place]) -No problems with GI function and patient does see specialist periodically.  12. Breakdown (mechanical) of implanted penile prosthesis, initial encounter [facility]) -Follow-up with urology as planned for removal of prosthesis and surgery  Patient Instructions                       Medicare Annual Wellness Visit  [facility] and the medical providers at [facility] strive to bring you the best medical care.  In doing so we not only want to address your current medical conditions and concerns but also to detect new conditions early and prevent illness, disease and health-related problems.    Medicare offers a yearly Wellness Visit which allows our clinical staff to assess your need for preventative services including immunizations, lifestyle education, counseling to decrease risk of preventable diseases and screening for fall risk and other medical concerns.    This visit is provided free of charge (no copay) for all Medicare recipients. The clinical pharmacists at [facility] have begun to conduct these Wellness Visits which will also include a thorough review of all your medications.    As you primary medical provider recommend that you make an appointment for your Annual Wellness Visit if you have not done so already this year.  You may set up this appointment before you leave today or you may call back ([phone]) and schedule an appointment.  Please make sure when you call that you mention that you are scheduling your Annual Wellness Visit with the clinical pharmacist so that the appointment may be made for the proper length of time.     Continue current medications. Continue good therapeutic lifestyle changes which include good diet and exercise. Fall precautions discussed with patient. If an FOBT was given today- please return it to  our front desk. If you are over 25 years old - you may need Prevnar 79 or the adult Pneumonia vaccine.  **Flu shots are available--- please call and schedule a FLU-CLINIC appointment**  After your visit with [place] today you will receive a survey in the mail or online from [employer] regarding your care with [place]. Please take a moment to fill this out. Your feedback is very important to [place] as you can help [place] better understand your patient needs as well as improve your experience and satisfaction. WE CARE ABOUT YOU!!!     Arrie Senate MD

## 2017-12-10 NOTE — Progress Notes (Signed)
LVMM for Frederick Davis at [facility] regarding Cardiolgy PA office visit note from 12/08/17 states that PA to speak with Dr Percival Spanish before giving clearance and I do not see any followup on this. Asked Frederick Davis on message if she had any followup on this .

## 2017-12-10 NOTE — Progress Notes (Addendum)
EKG-12/08/17-epic  12/08/17- LOV with Normajean Glasgow( cardiology) - epic which discusses clearance but also states - to speak with Dr Percival Spanish before giving clearance for surgery. Then in note it states that PA spoke with Dr Oval Linsey in regards to clearance for surgery.  12/10/17- LOV- Dr Redge Gainer- epic() PCP) ECHO-05/28/2017-epic - mild to mod aortic stenosis  [place]- 11/28/17-epic  CATH-2017-epic  CXR- 05/27/17-epic

## 2017-12-10 NOTE — Patient Instructions (Addendum)
[facility]  12/10/2017   Your procedure is scheduled on: 12/15/2017   Report to [facility] Main  Entrance  Report to admitting at     1030 AM   Call this number if you have problems the morning of surgery [phone]   Remember: Do not eat food or drink liquids :After Midnight.     Take these medicines the morning of surgery with A SIP OF WATER:  Amlodipine ( Norvasc), Carvedilol ( coreg)                               You may not have any metal on your body including hair pins and              piercings  Do not wear jewelry, , lotions, powders or perfumes, deodorant              Men may shave face and neck.   Do not bring valuables to the hospital. [place].  Contacts, dentures or bridgework may not be worn into surgery.  Leave suitcase in the car. After surgery it may be brought to your room.                  Please read over the following fact sheets you were given: _____________________________________________________________________             [facility] - Preparing for Surgery Before surgery, you can play an important role.  Because skin is not sterile, your skin needs to be as free of germs as possible.  You can reduce the number of germs on your skin by washing with CHG (chlorahexidine gluconate) soap before surgery.  CHG is an antiseptic cleaner which kills germs and bonds with the skin to continue killing germs even after washing. Please DO NOT use if you have an allergy to CHG or antibacterial soaps.  If your skin becomes reddened/irritated stop using the CHG and inform your nurse when you arrive at Short Stay. Do not shave (including legs and underarms) for at least 48 hours prior to the first CHG shower.  You may shave your face/neck. Please follow these instructions carefully:  1.  Shower with CHG Soap the night before surgery and the  morning of Surgery.  2.  If you choose to wash  your hair, wash your hair first as usual with your  normal  shampoo.  3.  After you shampoo, rinse your hair and body thoroughly to remove the  shampoo.                           4.  Use CHG as you would any other liquid soap.  You can apply chg directly  to the skin and wash                       Gently with a scrungie or clean washcloth.  5.  Apply the CHG Soap to your body ONLY FROM THE NECK DOWN.   Do not use on face/ open                           Wound  or open sores. Avoid contact with eyes, ears mouth and genitals (private parts).                       Wash face,  Genitals (private parts) with your normal soap.             6.  Wash thoroughly, paying special attention to the area where your surgery  will be performed.  7.  Thoroughly rinse your body with warm water from the neck down.  8.  DO NOT shower/wash with your normal soap after using and rinsing off  the CHG Soap.                9.  Pat yourself dry with a clean towel.            10.  Wear clean pajamas.            11.  Place clean sheets on your bed the night of your first shower and do not  sleep with pets. Day of Surgery : Do not apply any lotions/deodorants the morning of surgery.  Please wear clean clothes to the hospital/surgery center.  FAILURE TO FOLLOW THESE INSTRUCTIONS MAY RESULT IN THE CANCELLATION OF YOUR SURGERY PATIENT SIGNATURE_________________________________  NURSE SIGNATURE__________________________________  ________________________________________________________________________

## 2017-12-11 ENCOUNTER — Other Ambulatory Visit: Payer: Self-pay

## 2017-12-11 ENCOUNTER — Encounter (HOSPITAL_COMMUNITY)
Admission: RE | Admit: 2017-12-11 | Discharge: 2017-12-11 | Disposition: A | Discharge status: Home / Self Care | Payer: Medicare Other | Source: Ambulatory Visit | Attending: Urology | Admitting: Urology

## 2017-12-11 ENCOUNTER — Encounter (HOSPITAL_COMMUNITY): Payer: Self-pay

## 2017-12-11 DIAGNOSIS — M1990 Unspecified osteoarthritis, unspecified site: Secondary | ICD-10-CM | POA: Insufficient documentation

## 2017-12-11 DIAGNOSIS — K449 Diaphragmatic hernia without obstruction or gangrene: Secondary | ICD-10-CM | POA: Insufficient documentation

## 2017-12-11 DIAGNOSIS — I25119 Atherosclerotic heart disease of native coronary artery with unspecified angina pectoris: Secondary | ICD-10-CM | POA: Diagnosis not present

## 2017-12-11 DIAGNOSIS — I499 Cardiac arrhythmia, unspecified: Secondary | ICD-10-CM | POA: Insufficient documentation

## 2017-12-11 DIAGNOSIS — E039 Hypothyroidism, unspecified: Secondary | ICD-10-CM | POA: Diagnosis not present

## 2017-12-11 DIAGNOSIS — I252 Old myocardial infarction: Secondary | ICD-10-CM | POA: Insufficient documentation

## 2017-12-11 DIAGNOSIS — Z01812 Encounter for preprocedural laboratory examination: Secondary | ICD-10-CM | POA: Diagnosis not present

## 2017-12-11 DIAGNOSIS — Z87891 Personal history of nicotine dependence: Secondary | ICD-10-CM | POA: Diagnosis not present

## 2017-12-11 DIAGNOSIS — I10 Essential (primary) hypertension: Secondary | ICD-10-CM | POA: Insufficient documentation

## 2017-12-11 DIAGNOSIS — N289 Disorder of kidney and ureter, unspecified: Secondary | ICD-10-CM | POA: Insufficient documentation

## 2017-12-11 HISTORY — DX: Dyspnea, unspecified: R06.00

## 2017-12-11 LAB — BASIC METABOLIC PANEL
Anion gap: 9 (ref 5–15)
BUN: 32 mg/dL — ABNORMAL HIGH (ref 6–20)
CHLORIDE: 107 mmol/L (ref 101–111)
CO2: 23 mmol/L (ref 22–32)
CREATININE: 1.64 mg/dL — AB (ref 0.61–1.24)
Calcium: 9.7 mg/dL (ref 8.9–10.3)
GFR calc non Af Amer: 34 mL/min — ABNORMAL LOW (ref >60)
GFR, EST AFRICAN AMERICAN: 40 mL/min — AB (ref >60)
Glucose, Bld: 94 mg/dL (ref 65–99)
Potassium: 5.2 mmol/L — ABNORMAL HIGH (ref 3.5–5.1)
Sodium: 139 mmol/L (ref 135–145)

## 2017-12-11 LAB — CBC
HCT: 38.4 % — ABNORMAL LOW (ref 39.0–52.0)
HEMOGLOBIN: 12.1 g/dL — AB (ref 13.0–17.0)
MCH: 27.3 pg (ref 26.0–34.0)
MCHC: 31.5 g/dL (ref 30.0–36.0)
MCV: 86.5 fL (ref 78.0–100.0)
PLATELETS: 260 10*3/uL (ref 150–400)
RBC: 4.44 MIL/uL (ref 4.22–5.81)
RDW: 14.7 % (ref 11.5–15.5)
WBC: 6.6 10*3/uL (ref 4.0–10.5)

## 2017-12-11 NOTE — Progress Notes (Signed)
BMET RESULTS ROUTED TO DR Diona Fanti AT [facility]

## 2017-12-11 NOTE — Progress Notes (Signed)
EKG 12-08-17 Epic, SPOKE WITH DR ROSE ANESTHESIA AND MADE AWARE PATIENT MEDICAL HISTORY AND PENDING CARDIAC CLEARANCE, ANESTHESIA WILL SEE PATIENT DAY OF SURGERY.

## 2017-12-12 ENCOUNTER — Other Ambulatory Visit: Payer: Medicare Other

## 2017-12-12 DIAGNOSIS — N183 Chronic kidney disease, stage 3 unspecified: Secondary | ICD-10-CM

## 2017-12-12 DIAGNOSIS — I10 Essential (primary) hypertension: Secondary | ICD-10-CM

## 2017-12-12 DIAGNOSIS — E559 Vitamin D deficiency, unspecified: Secondary | ICD-10-CM | POA: Diagnosis not present

## 2017-12-12 DIAGNOSIS — E7800 Pure hypercholesterolemia, unspecified: Secondary | ICD-10-CM

## 2017-12-12 DIAGNOSIS — C61 Malignant neoplasm of prostate: Secondary | ICD-10-CM | POA: Diagnosis not present

## 2017-12-12 DIAGNOSIS — I2510 Atherosclerotic heart disease of native coronary artery without angina pectoris: Secondary | ICD-10-CM | POA: Diagnosis not present

## 2017-12-13 LAB — CBC WITH DIFFERENTIAL/PLATELET
BASOS: 1 %
Basophils Absolute: 0.1 10*3/uL (ref 0.0–0.2)
EOS (ABSOLUTE): 0.2 10*3/uL (ref 0.0–0.4)
Eos: 4 %
HEMATOCRIT: 37.4 % — AB (ref 37.5–51.0)
HEMOGLOBIN: 11.8 g/dL — AB (ref 13.0–17.7)
IMMATURE GRANS (ABS): 0 10*3/uL (ref 0.0–0.1)
Immature Granulocytes: 0 %
LYMPHS ABS: 1.7 10*3/uL (ref 0.7–3.1)
LYMPHS: 27 %
MCH: 26.7 pg (ref 26.6–33.0)
MCHC: 31.6 g/dL (ref 31.5–35.7)
MCV: 85 fL (ref 79–97)
MONOCYTES: 5 %
Monocytes Absolute: 0.3 10*3/uL (ref 0.1–0.9)
NEUTROS ABS: 4 10*3/uL (ref 1.4–7.0)
Neutrophils: 63 %
Platelets: 238 10*3/uL (ref 150–379)
RBC: 4.42 x10E6/uL (ref 4.14–5.80)
RDW: 14.9 % (ref 12.3–15.4)
WBC: 6.3 10*3/uL (ref 3.4–10.8)

## 2017-12-13 LAB — HEPATIC FUNCTION PANEL
ALBUMIN: 4 g/dL (ref 3.2–4.6)
ALT: 11 IU/L (ref 0–44)
AST: 15 IU/L (ref 0–40)
Alkaline Phosphatase: 110 IU/L (ref 39–117)
BILIRUBIN TOTAL: 0.6 mg/dL (ref 0.0–1.2)
Bilirubin, Direct: 0.19 mg/dL (ref 0.00–0.40)
Total Protein: 6.3 g/dL (ref 6.0–8.5)

## 2017-12-13 LAB — LIPID PANEL
CHOL/HDL RATIO: 2.1 ratio (ref 0.0–5.0)
CHOLESTEROL TOTAL: 138 mg/dL (ref 100–199)
HDL: 65 mg/dL (ref >39)
LDL CALC: 59 mg/dL (ref 0–99)
TRIGLYCERIDES: 72 mg/dL (ref 0–149)
VLDL Cholesterol Cal: 14 mg/dL (ref 5–40)

## 2017-12-13 LAB — BMP8+EGFR
BUN / CREAT RATIO: 18 (ref 10–24)
BUN: 28 mg/dL (ref 10–36)
CALCIUM: 9.7 mg/dL (ref 8.6–10.2)
CHLORIDE: 106 mmol/L (ref 96–106)
CO2: 19 mmol/L — ABNORMAL LOW (ref 20–29)
Creatinine, Ser: 1.55 mg/dL — ABNORMAL HIGH (ref 0.76–1.27)
GFR, EST AFRICAN AMERICAN: 44 mL/min/{1.73_m2} — AB (ref >59)
GFR, EST NON AFRICAN AMERICAN: 38 mL/min/{1.73_m2} — AB (ref >59)
Glucose: 84 mg/dL (ref 65–99)
Potassium: 4.9 mmol/L (ref 3.5–5.2)
Sodium: 140 mmol/L (ref 134–144)

## 2017-12-13 LAB — VITAMIN D 25 HYDROXY (VIT D DEFICIENCY, FRACTURES): Vit D, 25-Hydroxy: 32 ng/mL (ref 30.0–100.0)

## 2017-12-15 ENCOUNTER — Observation Stay (HOSPITAL_COMMUNITY)
Admission: RE | Admit: 2017-12-15 | Discharge: 2017-12-16 | Disposition: A | Discharge status: Home / Self Care | Payer: Medicare Other | Source: Ambulatory Visit | Attending: Urology | Admitting: Urology

## 2017-12-15 ENCOUNTER — Encounter (HOSPITAL_COMMUNITY)
Admission: RE | Disposition: A | Discharge status: Home / Self Care | Payer: Self-pay | Source: Ambulatory Visit | Attending: Urology

## 2017-12-15 ENCOUNTER — Encounter (HOSPITAL_COMMUNITY): Payer: Self-pay | Admitting: Anesthesiology

## 2017-12-15 ENCOUNTER — Ambulatory Visit (HOSPITAL_COMMUNITY): Payer: Medicare Other | Admitting: Anesthesiology

## 2017-12-15 ENCOUNTER — Other Ambulatory Visit: Payer: Self-pay

## 2017-12-15 DIAGNOSIS — Z7982 Long term (current) use of aspirin: Secondary | ICD-10-CM | POA: Insufficient documentation

## 2017-12-15 DIAGNOSIS — T83490A Other mechanical complication of penile (implanted) prosthesis, initial encounter: Secondary | ICD-10-CM | POA: Diagnosis present

## 2017-12-15 DIAGNOSIS — M1990 Unspecified osteoarthritis, unspecified site: Secondary | ICD-10-CM | POA: Diagnosis not present

## 2017-12-15 DIAGNOSIS — Z888 Allergy status to other drugs, medicaments and biological substances status: Secondary | ICD-10-CM | POA: Insufficient documentation

## 2017-12-15 DIAGNOSIS — Z85528 Personal history of other malignant neoplasm of kidney: Secondary | ICD-10-CM | POA: Diagnosis not present

## 2017-12-15 DIAGNOSIS — T8361XA Infection and inflammatory reaction due to implanted penile prosthesis, initial encounter: Principal | ICD-10-CM | POA: Insufficient documentation

## 2017-12-15 DIAGNOSIS — D649 Anemia, unspecified: Secondary | ICD-10-CM | POA: Diagnosis not present

## 2017-12-15 DIAGNOSIS — Y828 Other medical devices associated with adverse incidents: Secondary | ICD-10-CM | POA: Insufficient documentation

## 2017-12-15 DIAGNOSIS — T83410A Breakdown (mechanical) of penile (implanted) prosthesis, initial encounter: Secondary | ICD-10-CM | POA: Diagnosis not present

## 2017-12-15 DIAGNOSIS — Z79899 Other long term (current) drug therapy: Secondary | ICD-10-CM | POA: Insufficient documentation

## 2017-12-15 DIAGNOSIS — Z951 Presence of aortocoronary bypass graft: Secondary | ICD-10-CM | POA: Diagnosis not present

## 2017-12-15 DIAGNOSIS — E039 Hypothyroidism, unspecified: Secondary | ICD-10-CM | POA: Diagnosis not present

## 2017-12-15 DIAGNOSIS — I252 Old myocardial infarction: Secondary | ICD-10-CM | POA: Insufficient documentation

## 2017-12-15 DIAGNOSIS — I2510 Atherosclerotic heart disease of native coronary artery without angina pectoris: Secondary | ICD-10-CM | POA: Insufficient documentation

## 2017-12-15 DIAGNOSIS — Z85048 Personal history of other malignant neoplasm of rectum, rectosigmoid junction, and anus: Secondary | ICD-10-CM | POA: Insufficient documentation

## 2017-12-15 DIAGNOSIS — N183 Chronic kidney disease, stage 3 (moderate): Secondary | ICD-10-CM | POA: Insufficient documentation

## 2017-12-15 DIAGNOSIS — I129 Hypertensive chronic kidney disease with stage 1 through stage 4 chronic kidney disease, or unspecified chronic kidney disease: Secondary | ICD-10-CM | POA: Diagnosis not present

## 2017-12-15 DIAGNOSIS — E785 Hyperlipidemia, unspecified: Secondary | ICD-10-CM | POA: Diagnosis not present

## 2017-12-15 DIAGNOSIS — Z8546 Personal history of malignant neoplasm of prostate: Secondary | ICD-10-CM | POA: Insufficient documentation

## 2017-12-15 DIAGNOSIS — M810 Age-related osteoporosis without current pathological fracture: Secondary | ICD-10-CM | POA: Diagnosis not present

## 2017-12-15 DIAGNOSIS — Z87442 Personal history of urinary calculi: Secondary | ICD-10-CM | POA: Diagnosis not present

## 2017-12-15 DIAGNOSIS — I4510 Unspecified right bundle-branch block: Secondary | ICD-10-CM | POA: Insufficient documentation

## 2017-12-15 DIAGNOSIS — Z87891 Personal history of nicotine dependence: Secondary | ICD-10-CM | POA: Insufficient documentation

## 2017-12-15 DIAGNOSIS — I080 Rheumatic disorders of both mitral and aortic valves: Secondary | ICD-10-CM | POA: Insufficient documentation

## 2017-12-15 HISTORY — PX: REMOVAL OF PENILE PROSTHESIS: SHX6059

## 2017-12-15 SURGERY — REMOVAL, PENILE PROSTHESIS
Anesthesia: General | Site: Penis

## 2017-12-15 MED ORDER — ONDANSETRON HCL 4 MG/2ML IJ SOLN
INTRAMUSCULAR | Status: DC | PRN
  Administered 2017-12-15: 4 mg via INTRAVENOUS

## 2017-12-15 MED ORDER — OXYCODONE HCL 5 MG PO TABS: 5.0000 mg | ORAL_TABLET | Freq: Once | ORAL | Status: DC | PRN

## 2017-12-15 MED ORDER — LACTATED RINGERS IV SOLN
INTRAVENOUS | Status: DC
  Administered 2017-12-15 (×2): via INTRAVENOUS

## 2017-12-15 MED ORDER — FENTANYL CITRATE (PF) 100 MCG/2ML IJ SOLN
INTRAMUSCULAR | Status: DC | PRN
  Administered 2017-12-15 (×2): 50 ug via INTRAVENOUS

## 2017-12-15 MED ORDER — ACETAMINOPHEN 500 MG PO TABS
500.0000 mg | ORAL_TABLET | Freq: Every day | ORAL | Status: DC
  Administered 2017-12-15: 500 mg via ORAL
  Filled 2017-12-15: qty 1

## 2017-12-15 MED ORDER — PROPOFOL 10 MG/ML IV BOLUS
INTRAVENOUS | Status: DC | PRN
  Administered 2017-12-15: 200 mg via INTRAVENOUS

## 2017-12-15 MED ORDER — LIDOCAINE 2% (20 MG/ML) 5 ML SYRINGE
INTRAMUSCULAR | Status: DC | PRN
  Administered 2017-12-15: 100 mg via INTRAVENOUS

## 2017-12-15 MED ORDER — FUROSEMIDE 20 MG PO TABS: 20.0000 mg | ORAL_TABLET | Freq: Every day | ORAL | Status: DC | PRN

## 2017-12-15 MED ORDER — OXYBUTYNIN CHLORIDE 5 MG PO TABS
5.0000 mg | ORAL_TABLET | Freq: Three times a day (TID) | ORAL | Status: DC | PRN
  Administered 2017-12-15 (×2): 5 mg via ORAL
  Filled 2017-12-15 (×2): qty 1

## 2017-12-15 MED ORDER — SODIUM CHLORIDE 0.9% FLUSH
3.0000 mL | Freq: Two times a day (BID) | INTRAVENOUS | Status: DC
  Administered 2017-12-15 – 2017-12-16 (×3): 3 mL via INTRAVENOUS

## 2017-12-15 MED ORDER — LEVOTHYROXINE SODIUM 50 MCG PO TABS
75.0000 ug | ORAL_TABLET | Freq: Every day | ORAL | Status: DC
  Administered 2017-12-15: 75 ug via ORAL
  Filled 2017-12-15: qty 1

## 2017-12-15 MED ORDER — CARVEDILOL 3.125 MG PO TABS
3.1250 mg | ORAL_TABLET | Freq: Two times a day (BID) | ORAL | Status: DC
  Administered 2017-12-15 – 2017-12-16 (×2): 3.125 mg via ORAL
  Filled 2017-12-15 (×2): qty 1

## 2017-12-15 MED ORDER — LEVOTHYROXINE SODIUM 50 MCG PO TABS: 75.0000 ug | ORAL_TABLET | Freq: Every day | ORAL | Status: DC

## 2017-12-15 MED ORDER — CEFAZOLIN SODIUM-DEXTROSE 2-4 GM/100ML-% IV SOLN
2.0000 g | INTRAVENOUS | Status: AC
  Administered 2017-12-15: 2 g via INTRAVENOUS
  Filled 2017-12-15: qty 100

## 2017-12-15 MED ORDER — FENTANYL CITRATE (PF) 100 MCG/2ML IJ SOLN
INTRAMUSCULAR | Status: AC
  Filled 2017-12-15: qty 2

## 2017-12-15 MED ORDER — PROPOFOL 10 MG/ML IV BOLUS
INTRAVENOUS | Status: AC
  Filled 2017-12-15: qty 20

## 2017-12-15 MED ORDER — GLYCOPYRROLATE 0.2 MG/ML IV SOSY
PREFILLED_SYRINGE | INTRAVENOUS | Status: AC
  Filled 2017-12-15: qty 5

## 2017-12-15 MED ORDER — SODIUM CHLORIDE 0.9 % IV SOLN: 250.0000 mL | INTRAVENOUS | Status: DC | PRN

## 2017-12-15 MED ORDER — OXYCODONE HCL 5 MG PO TABS
5.0000 mg | ORAL_TABLET | ORAL | Status: DC | PRN
  Administered 2017-12-15 (×2): 5 mg via ORAL
  Filled 2017-12-15 (×2): qty 1

## 2017-12-15 MED ORDER — AMLODIPINE BESYLATE 5 MG PO TABS
2.5000 mg | ORAL_TABLET | Freq: Every day | ORAL | Status: DC
  Administered 2017-12-16: 2.5 mg via ORAL
  Filled 2017-12-15 (×2): qty 1

## 2017-12-15 MED ORDER — 0.9 % SODIUM CHLORIDE (POUR BTL) OPTIME
TOPICAL | Status: DC | PRN
  Administered 2017-12-15: 1000 mL

## 2017-12-15 MED ORDER — HYDROMORPHONE HCL 1 MG/ML IJ SOLN: 0.2500 mg | INTRAMUSCULAR | Status: DC | PRN

## 2017-12-15 MED ORDER — ASPIRIN 81 MG PO CHEW
81.0000 mg | CHEWABLE_TABLET | Freq: Every day | ORAL | Status: DC
  Administered 2017-12-15: 81 mg via ORAL
  Filled 2017-12-15: qty 1

## 2017-12-15 MED ORDER — ONDANSETRON HCL 4 MG/2ML IJ SOLN: 4.0000 mg | INTRAMUSCULAR | Status: DC | PRN

## 2017-12-15 MED ORDER — CEPHALEXIN 500 MG PO CAPS
500.0000 mg | ORAL_CAPSULE | Freq: Three times a day (TID) | ORAL | Status: DC
  Administered 2017-12-15 – 2017-12-16 (×3): 500 mg via ORAL
  Filled 2017-12-15 (×3): qty 1

## 2017-12-15 MED ORDER — GLYCOPYRROLATE 0.2 MG/ML IV SOSY
PREFILLED_SYRINGE | INTRAVENOUS | Status: DC | PRN
  Administered 2017-12-15: .2 mg via INTRAVENOUS

## 2017-12-15 MED ORDER — SENNA 8.6 MG PO TABS
1.0000 | ORAL_TABLET | Freq: Two times a day (BID) | ORAL | Status: DC
  Filled 2017-12-15: qty 1

## 2017-12-15 MED ORDER — PHENYLEPHRINE HCL 10 MG/ML IJ SOLN
INTRAVENOUS | Status: DC | PRN
  Administered 2017-12-15: 25 ug/min via INTRAVENOUS

## 2017-12-15 MED ORDER — OXYCODONE HCL 5 MG/5ML PO SOLN
5.0000 mg | Freq: Once | ORAL | Status: DC | PRN
  Filled 2017-12-15: qty 5

## 2017-12-15 MED ORDER — SODIUM CHLORIDE 0.9% FLUSH: 3.0000 mL | INTRAVENOUS | Status: DC | PRN

## 2017-12-15 MED ORDER — NITROGLYCERIN 0.4 MG SL SUBL: 0.4000 mg | SUBLINGUAL_TABLET | SUBLINGUAL | Status: DC | PRN

## 2017-12-15 SURGICAL SUPPLY — 39 items
ADH SKN CLS APL DERMABOND .7 (GAUZE/BANDAGES/DRESSINGS) ×1
APL SKNCLS STERI-STRIP NONHPOA (GAUZE/BANDAGES/DRESSINGS)
BAG URINE DRAINAGE (UROLOGICAL SUPPLIES) ×1 IMPLANT
BANDAGE COBAN STERILE 2 (GAUZE/BANDAGES/DRESSINGS) IMPLANT
BENZOIN TINCTURE PRP APPL 2/3 (GAUZE/BANDAGES/DRESSINGS) ×1 IMPLANT
BLADE HEX COATED 2.75 (ELECTRODE) ×1 IMPLANT
BNDG GAUZE ELAST 4 BULKY (GAUZE/BANDAGES/DRESSINGS) ×1 IMPLANT
CATH FOLEY 2WAY SLVR  5CC 16FR (CATHETERS) ×1
CATH FOLEY 2WAY SLVR 5CC 16FR (CATHETERS) ×1 IMPLANT
COVER MAYO STAND STRL (DRAPES) ×1 IMPLANT
COVER SURGICAL LIGHT HANDLE (MISCELLANEOUS) ×2 IMPLANT
DERMABOND ADVANCED (GAUZE/BANDAGES/DRESSINGS) ×1
DERMABOND ADVANCED .7 DNX12 (GAUZE/BANDAGES/DRESSINGS) IMPLANT
DISSECTOR ROUND CHERRY 3/8 STR (MISCELLANEOUS) ×1 IMPLANT
DRAIN PENROSE 18X1/4 LTX STRL (WOUND CARE) ×1 IMPLANT
DRAPE LAPAROTOMY T 102X78X121 (DRAPES) ×2 IMPLANT
DRSG TEGADERM 4X4.75 (GAUZE/BANDAGES/DRESSINGS) ×1 IMPLANT
GAUZE SPONGE 4X4 12PLY STRL (GAUZE/BANDAGES/DRESSINGS) ×1 IMPLANT
GLOVE BIOGEL M 8.0 STRL (GLOVE) ×2 IMPLANT
GOWN STRL REUS W/TWL XL LVL3 (GOWN DISPOSABLE) ×3 IMPLANT
KIT BASIN OR (CUSTOM PROCEDURE TRAY) ×2 IMPLANT
NS IRRIG 1000ML POUR BTL (IV SOLUTION) ×2 IMPLANT
PACK GENERAL/GYN (CUSTOM PROCEDURE TRAY) ×2 IMPLANT
PLUG CATH AND CAP STER (CATHETERS) ×2 IMPLANT
RETRACTOR WILSON SYSTEM (INSTRUMENTS) IMPLANT
SOL PREP POV-IOD 4OZ 10% (MISCELLANEOUS) ×2 IMPLANT
SOL PREP PROV IODINE SCRUB 4OZ (MISCELLANEOUS) ×2 IMPLANT
SPONGE LAP 4X18 X RAY DECT (DISPOSABLE) IMPLANT
STRIP CLOSURE SKIN 1/2X4 (GAUZE/BANDAGES/DRESSINGS) ×1 IMPLANT
SUPPORT SCROTAL LG STRP (MISCELLANEOUS) ×2 IMPLANT
SUT CHROMIC 2 0 SH (SUTURE) ×1 IMPLANT
SUT MNCRL AB 4-0 PS2 18 (SUTURE) ×1 IMPLANT
SUT VIC AB 2-0 SH 27 (SUTURE) ×6
SUT VIC AB 2-0 SH 27X BRD (SUTURE) IMPLANT
SUT VIC AB 2-0 UR5 27 (SUTURE) ×6 IMPLANT
SYR 20CC LL (SYRINGE) ×1 IMPLANT
SYR 50ML LL SCALE MARK (SYRINGE) ×3 IMPLANT
TOWEL OR 17X26 10 PK STRL BLUE (TOWEL DISPOSABLE) ×4 IMPLANT
WATER STERILE IRR 1000ML POUR (IV SOLUTION) ×1 IMPLANT

## 2017-12-15 NOTE — Op Note (Signed)
Preoperative diagnosis: Eroded, infected 2 piece penile prosthesis  Postoperative diagnosis: Same  Principal procedure: Explant of eroded Ambicor prosthesis  Surgeon: Devota Viruet  Anesthesia: General with LMA  Complications: None  Estimated blood loss: 50 cc  Specimen: The prosthesis, discarded  Drains: Quarter-inch Penrose drains, placed in the scrotum bilaterally, 30 French Foley catheter to bedside bag  Indications: 84 year old male who had his Ambicor prosthesis placed in 2003 by Dr. Terance Hart.  There has recently been complications associated with this, i.e. penile pain and infections.  Recent cystoscopy revealed the left cylinder to be eroded into the urethra.  He presents at this time for explant of this prosthesis.  I explained the procedure to the patient and his wife.  They understand and desire to proceed.  Description of procedure: The patient was properly identified in the holding area.  He received preoperative IV antibiotics.  He was then taken to the operating room where general anesthetic was administered with the LMA.  He was placed in the recumbent position.  Genitalia, perineum and lower abdomen were prepped and draped.  Timeout was then performed.  18 French Foley catheter was placed in his bladder.  The penis was retracted superiorly.  I then cut down over top of the palpable left prosthesis tubing.  Using electrocautery and having proper exposure, dissection was carried down to the left prosthesis cylinder.  A corporotomy was made approximately 3 cm long.  The cylinder in the attached 2 cm rear tip extender were both removed intact.  I then irrigated the corporal space with saline.  I then cut down over top of the tubing to the patient's right sided scrotal reservoir/pump.  The pump was then removed.  The tubing was then followed up to the right corporal body/cylinder.  Using electrocautery, the corporotomy was performed along the length of the cylinder for approximately 3  cm.  The cylinder and the attached 2 cm extender were removed intact.  This corporal space was then irrigated as well.  Corporotomies were then closed with running 2-0 Vicryl sutures.  The scrotal sac was then irrigated, including the pump area.  I then left to quarter inch Penrose drains in, 1 over top of the left corporotomy, 1 over top of the right.  Subcutaneous tissue was reapproximated using interrupted 3-0 chromic sutures.  Care was taken to avoid suturing the drains.  Skin edges were reapproximated using 4-0 Monocryl placed in horizontal mattress fashion.  The same Monocryl was used to suture the drains to the skin.  Dermabond was placed.  Dry sterile fluff dressing was placed underneath the jockstrap.  The procedure was then terminated.  The patient was then awakened and taken to the PACU in stable condition, having tolerated the procedure well.

## 2017-12-15 NOTE — Transfer of Care (Signed)
Immediate Anesthesia Transfer of Care Note  Patient: Frederick Davis [facility]  Procedure(s) Performed: REMOVAL OF PENILE PROSTHESIS (N/A Penis)  Patient Location: PACU  Anesthesia Type:General  Level of Consciousness: awake, alert  and oriented  Airway & Oxygen Therapy: Patient Spontanous Breathing and Patient connected to face mask oxygen  Post-op Assessment: Report given to RN  Post vital signs: Reviewed and stable  Last Vitals:  Vitals:   12/15/17 1041 12/15/17 1349  BP: (!) 147/98   Pulse: 67 (P) 66  Resp: 18 (P) 11  Temp: 36.5 C   SpO2: 99% (P) 100%    Last Pain:  Vitals:   12/15/17 1349  TempSrc:   PainSc: (P) Asleep         Complications: No apparent anesthesia complications

## 2017-12-15 NOTE — Anesthesia Procedure Notes (Signed)
Procedure Name: LMA Insertion Date/Time: 12/15/2017 12:45 PM Performed by: Lavina Hamman, CRNA Pre-anesthesia Checklist: Patient identified, Emergency Drugs available, Suction available and Patient being monitored Patient Re-evaluated:Patient Re-evaluated prior to induction Oxygen Delivery Method: Circle System Utilized Preoxygenation: Pre-oxygenation with 100% oxygen Induction Type: IV induction Ventilation: Mask ventilation without difficulty LMA: LMA inserted LMA Size: 4.0 Number of attempts: 1 Airway Equipment and Method: Bite block Placement Confirmation: positive ETCO2 Tube secured with: Tape Dental Injury: Teeth and Oropharynx as per pre-operative assessment

## 2017-12-15 NOTE — Anesthesia Postprocedure Evaluation (Signed)
Anesthesia Post Note  Patient: Frederick Davis [facility]  Procedure(s) Performed: REMOVAL OF PENILE PROSTHESIS (N/A Penis)     Patient location during evaluation: PACU Anesthesia Type: General Level of consciousness: awake and alert Pain management: pain level controlled Vital Signs Assessment: post-procedure vital signs reviewed and stable Respiratory status: spontaneous breathing, nonlabored ventilation and respiratory function stable Cardiovascular status: blood pressure returned to baseline and stable Postop Assessment: no apparent nausea or vomiting Anesthetic complications: no    Last Vitals:  Vitals:   12/15/17 1430 12/15/17 1455  BP: 140/86 (!) 129/92  Pulse: 63 65  Resp:  17  Temp: 36.8 C 36.5 C  SpO2: 98% 99%    Last Pain:  Vitals:   12/15/17 1455  TempSrc:   PainSc: 6    Pain Goal: Patients Stated Pain Goal: 4 (12/15/17 1455)               Lynda Rainwater

## 2017-12-15 NOTE — Anesthesia Preprocedure Evaluation (Addendum)
Anesthesia Evaluation  Patient identified by MRN, date of birth, ID band Patient awake    Reviewed: Allergy & Precautions, H&P , NPO status , Patient's Chart, lab work & pertinent test results, reviewed documented beta blocker date and time   Airway Mallampati: III  TM Distance: >3 FB Neck ROM: Full    Dental  (+) Caps, Teeth Intact   Pulmonary former smoker,     + decreased breath sounds      Cardiovascular hypertension, + angina + CAD and + Past MI  + dysrhythmias  Rhythm:Regular Rate:Normal     Neuro/Psych    GI/Hepatic hiatal hernia,   Endo/Other  Hypothyroidism   Renal/GU Renal disease     Musculoskeletal  (+) Arthritis ,   Abdominal   Peds  Hematology   Anesthesia Other Findings   Reproductive/Obstetrics                             Anesthesia Physical  Anesthesia Plan  ASA: III  Anesthesia Plan: General   Post-op Pain Management:    Induction: Intravenous  PONV Risk Score and Plan: 2 and Ondansetron and Treatment may vary due to age or medical condition  Airway Management Planned: LMA  Additional Equipment: None  Intra-op Plan:   Post-operative Plan: Extubation in OR  Informed Consent: I have reviewed the patients History and Physical, chart, labs and discussed the procedure including the risks, benefits and alternatives for the proposed anesthesia with the patient or authorized representative who has indicated his/her understanding and acceptance.   Dental advisory given  Plan Discussed with: CRNA  Anesthesia Plan Comments:        Anesthesia Quick Evaluation

## 2017-12-15 NOTE — H&P (Signed)
H&P  Chief Complaint: malfunctioning/eroded penile prosthesis  History of Present Illness: 84 year old male years out from a radical prostatectomy performed at [facility], who initially had a semirigid penile prosthesis placed shortly after his radical prostatectomy, which apparently was performed in the late 1980s.  He had this semirigid prosthesis revised to a two-piece AMS prosthesis by Dr. Terance Hart in 2003. The patient has had recent pelvic issues, infections, and was recently found, upon cystoscopy, to have erosion of his left prosthesis cylinder into his urethra.  He presents at this time for explant of  this prosthesis.  Past Medical History:  Diagnosis Date  . Anemia   . Aortic stenosis    a. mild-mod by TEE 11/2015.  . Arthritis    left knee   . CAD (coronary artery disease)    a. RCA thrombectomy 2014. b. CABGx3 in 11/2015 (EF 45-50% in 11/2015).  . Chronic renal insufficiency, stage III (moderate) (HCC)   . Dyspnea    WITH EXERTION  . Esophageal stricture YRS AGO  . Hypertension   . Hypothyroidism   . Kidney stone 1969  . MI (myocardial infarction) ([place]) 2017  . Mitral regurgitation    a. mild by TEE 11/2015.  Marland Kitchen Myocardial infarction ([place]) 03/12/13   Inferior MI.  LAD mid 80% stenosis, circ mild plaque, RCA 100% with thrombectomy PTCA.  EF 65%  . Osteoporosis   . PC (prostate cancer) ([place]) 1987  . RBBB   . Rectum cancer ([place]) 1986  . Renal cell cancer ([place]) 2005    Past Surgical History:  Procedure Laterality Date  . CARDIAC CATHETERIZATION N/A 12/06/2015   Procedure: Left Heart Cath and Coronary Angiography;  Surgeon: Burnell Blanks, MD;  Location: [place] CV [facility];  Service: Cardiovascular;  Laterality: N/A;  . COLONOSCOPY  01/08/2012   Procedure: COLONOSCOPY;  Surgeon: Rogene Houston, MD;  [facility];  Service: Endoscopy;  Laterality: N/A;  1030  . COLONOSCOPY N/A 07/20/2015   Procedure: COLONOSCOPY;  Surgeon: Rogene Houston, MD;   [facility];  Service: Endoscopy;  Laterality: N/A;  930 - moved to 9/29 @ 2:00  . COLOSTOMY Left 1986   colon cancer  . CORONARY ARTERY BYPASS GRAFT N/A 12/08/2015   Procedure: CORONARY ARTERY BYPASS GRAFTING (CABG);  Surgeon: Melrose Nakayama, MD;  Location: [place];  Service: Open Heart Surgery;  Laterality: N/A;  Times 3 using left internal mammary artery and endoscopycally harvested bilateral saphenous vein.  Marland Kitchen HERNIA REPAIR     2007 RIGHT INGUINAL HERNIA  . KIDNEY SURGERY Right 2004  . LEFT HEART CATHETERIZATION WITH CORONARY ANGIOGRAM N/A 03/12/2013   Procedure: STEMI;  Surgeon: Burnell Blanks, MD;  Location: [facility] CATH LAB;  Service: Cardiovascular;  Laterality: N/A;  . PERCUTANEOUS CORONARY INTERVENTION-BALLOON ONLY    . PROSTATECTOMY    . [place]  2006  . renal sphincter appliance  2007  . TEE WITHOUT CARDIOVERSION N/A 12/08/2015   Procedure: TRANSESOPHAGEAL ECHOCARDIOGRAM (TEE);  Surgeon: Melrose Nakayama, MD;  Location: [place];  Service: Open Heart Surgery;  Laterality: N/A;    Home Medications:    Allergies:  Allergies  Allergen Reactions  . Lipitor [Atorvastatin] Other (See Comments)    Muscle aches    Family History  Problem Relation Age of Onset  . Stroke Mother 59  . AAA (abdominal aortic aneurysm) Father 1       died from rupture at 17 yo  . Arthritis Father   .  Parkinson's disease Sister   . Cancer Son        prostate cancer  . Hypertension Son     Social History:  reports that he quit smoking about 49 years ago. His smoking use included cigarettes. He started smoking about 71 years ago. He has a 5.00 pack-year smoking history. he has never used smokeless tobacco. He reports that he drinks about 0.5 oz of alcohol per week. He reports that he does not use drugs.  ROS: A complete review of systems was performed.  All systems are negative except for pertinent findings as noted.  Physical Exam:  Vital signs in  last 24 hours: Temp:  [97.7 F (36.5 C)] 97.7 F (36.5 C) (02/25 1041) Pulse Rate:  [67] 67 (02/25 1041) Resp:  [18] 18 (02/25 1041) BP: (147)/(98) 147/98 (02/25 1041) SpO2:  [99 %] 99 % (02/25 1041) Constitutional:  Alert and oriented, No acute distress Cardiovascular: Regular rate and rhythm, No JVD Respiratory: Normal respiratory effort, Lungs clear bilaterally GI: Abdomen is soft, nontender, nondistended, no abdominal masses Genitourinary: No CVAT. Normal male phallus, testes are descended bilaterally and non-tender and without masses, scrotum is normal in appearance without lesions or masses, perineum is normal on inspection. Bilateral prosthesis cylinders are palpable and no significant tenderness is noted.  Prosthesis pump is present in the right hemiscrotum. Lymphatic: No lymphadenopathy Neurologic: Grossly intact, no focal deficits Psychiatric: Normal mood and affect  Laboratory Data:  No results for input(s): WBC, HGB, HCT, PLT in the last 72 hours.  No results for input(s): NA, K, CL, GLUCOSE, BUN, CALCIUM, CREATININE in the last 72 hours.  Invalid input(s): CO3   No results found for this or any previous visit (from the past 24 hour(s)). No results found for this or any previous visit (from the past 240 hour(s)).  Renal Function: Recent Labs    12/11/17 0958 12/12/17 0819  CREATININE 1.64* 1.55*   Estimated Creatinine Clearance: 28 mL/min (A) (by C-G formula based on SCr of 1.55 mg/dL (H)).  Radiologic Imaging: No results found.  Impression/Assessment:  Eroded/infected penile prosthesis  Plan:  Explant of penile prosthesis

## 2017-12-16 DIAGNOSIS — I129 Hypertensive chronic kidney disease with stage 1 through stage 4 chronic kidney disease, or unspecified chronic kidney disease: Secondary | ICD-10-CM | POA: Diagnosis not present

## 2017-12-16 DIAGNOSIS — I2510 Atherosclerotic heart disease of native coronary artery without angina pectoris: Secondary | ICD-10-CM | POA: Diagnosis not present

## 2017-12-16 DIAGNOSIS — I080 Rheumatic disorders of both mitral and aortic valves: Secondary | ICD-10-CM | POA: Diagnosis not present

## 2017-12-16 DIAGNOSIS — T8361XA Infection and inflammatory reaction due to implanted penile prosthesis, initial encounter: Secondary | ICD-10-CM | POA: Diagnosis not present

## 2017-12-16 DIAGNOSIS — I252 Old myocardial infarction: Secondary | ICD-10-CM | POA: Diagnosis not present

## 2017-12-16 DIAGNOSIS — E039 Hypothyroidism, unspecified: Secondary | ICD-10-CM | POA: Diagnosis not present

## 2017-12-16 MED ORDER — TRAMADOL HCL 50 MG PO TABS: 50.0000 mg | ORAL_TABLET | Freq: Four times a day (QID) | ORAL | 0 refills | Status: DC | PRN

## 2017-12-16 MED ORDER — CEPHALEXIN 500 MG PO CAPS: 500.0000 mg | ORAL_CAPSULE | Freq: Two times a day (BID) | ORAL | 0 refills | Status: DC

## 2017-12-16 NOTE — Progress Notes (Signed)
Dressing on penis where pin rose drain located was saturated. Reinforced before bedtime. Replaced in AM. Will continue to monitor.

## 2017-12-16 NOTE — Progress Notes (Signed)
Patient and his wife given discharge, follow up, foley care, and medication instructions, verbalized understanding with teach back, IV removed, prescriptions and personal belongings with patient, family to transport home

## 2017-12-21 DIAGNOSIS — R1084 Generalized abdominal pain: Secondary | ICD-10-CM | POA: Diagnosis not present

## 2017-12-21 NOTE — Discharge Summary (Signed)
Patient ID: Frederick Davis MRN: [id number] DOB/AGE: 82/08/1923 82 y.o.  Admit date: 12/15/2017 Discharge date: 12/16/2017  Primary Care Physician:  Chipper Herb, MD  Discharge Diagnoses:  Malfunction of penile prosthesis Present on Admission: . Malfunction of penile prosthesis (HCC)   Consults:  None   Discharge Medications: Allergies as of 12/16/2017      Reactions   Lipitor [atorvastatin] Other (See Comments)   Muscle aches      Medication List    TAKE these medications   acetaminophen 500 MG tablet Commonly known as:  TYLENOL Take 500 mg by mouth at bedtime.   amLODipine 2.5 MG tablet Commonly known as:  NORVASC Take 1 tablet (2.5 mg total) by mouth daily.   aspirin 81 MG tablet Take 1 tablet (81 mg total) by mouth daily. What changed:  when to take this   carvedilol 3.125 MG tablet Commonly known as:  COREG TAKE  (1)  TABLET TWICE A DAY WITH MEALS (BREAKFAST AND SUPPER)   cephALEXin 500 MG capsule Commonly known as:  KEFLEX Take 1 capsule (500 mg total) by mouth 2 (two) times daily.   furosemide 20 MG tablet Commonly known as:  LASIX Take 1 tablet daily as needed. What changed:    how much to take  how to take this  when to take this   levothyroxine 75 MCG tablet Commonly known as:  SYNTHROID, LEVOTHROID TAKE 1 TABLET DAILY What changed:    how much to take  how to take this  when to take this   multivitamin capsule Take 1 capsule daily by mouth.   nitroGLYCERIN 0.4 MG SL tablet Commonly known as:  NITROSTAT PLACE 1 TABLET UNDER THE TONGUE AT ONSET OF CHEST PAIN EVERY 5 MINTUES UP TO 3 TIMES AS NEEDED What changed:  See the new instructions.   traMADol 50 MG tablet Commonly known as:  ULTRAM Take 1 tablet (50 mg total) by mouth every 6 (six) hours as needed for moderate pain.   Vitamin D3 3000 units Tabs Take 3,000 Units by mouth daily.        Significant Diagnostic Studies:  No results found.  Brief H and P: For  complete details please refer to admission H and P, but in brief the pt is admitted for mgmt of an infected/eroded IPP.  Hospital Course:  Active Problems:   Malfunction of penile prosthesis (HCC)   Day of Discharge BP 124/79   Pulse 68   Temp 98.7 F (37.1 C) (Oral)   Resp 20   Ht 5' 4.5" (1.638 m)   Wt 166 lb (75.3 kg)   SpO2 94%   BMI 28.05 kg/m   No results found for this or any previous visit (from the past 24 hour(s)).  Physical Exam: General: Alert and awake oriented x3 not in any acute distress. HEENT: anicteric sclera, pupils reactive to light and accommodation CVS: S1-S2 clear no murmur rubs or gallops Chest: clear to auscultation bilaterally, no wheezing rales or rhonchi Abdomen: soft nontender, nondistended, normal bowel sounds, no organomegaly Extremities: no cyanosis, clubbing or edema noted bilaterally Neuro: Cranial nerves II-XII intact, no focal neurological deficits  Disposition:  Home  Diet:  Regular  Activity:  Discussed w/ pt   Disposition and Follow-up:    Arranged  TESTS THAT NEED FOLLOW-UP  None  DISCHARGE FOLLOW-UP   Time spent on Discharge:  10 mins  Signed: [name] 12/21/2017, 9:08 AM

## 2017-12-24 ENCOUNTER — Other Ambulatory Visit: Payer: Self-pay | Admitting: Urology

## 2017-12-24 DIAGNOSIS — T83410D Breakdown (mechanical) of penile (implanted) prosthesis, subsequent encounter: Secondary | ICD-10-CM

## 2017-12-26 ENCOUNTER — Encounter: Payer: Self-pay | Admitting: Family Medicine

## 2017-12-26 ENCOUNTER — Ambulatory Visit (INDEPENDENT_AMBULATORY_CARE_PROVIDER_SITE_OTHER): Payer: Medicare Other | Admitting: Family Medicine

## 2017-12-26 VITALS — BP 110/68 | HR 60 | Temp 97.6°F | Ht 64.5 in | Wt 167.0 lb

## 2017-12-26 DIAGNOSIS — I2510 Atherosclerotic heart disease of native coronary artery without angina pectoris: Secondary | ICD-10-CM

## 2017-12-26 DIAGNOSIS — J029 Acute pharyngitis, unspecified: Secondary | ICD-10-CM

## 2017-12-26 DIAGNOSIS — R05 Cough: Secondary | ICD-10-CM

## 2017-12-26 DIAGNOSIS — T83410A Breakdown (mechanical) of penile (implanted) prosthesis, initial encounter: Secondary | ICD-10-CM

## 2017-12-26 DIAGNOSIS — R059 Cough, unspecified: Secondary | ICD-10-CM

## 2017-12-26 LAB — RAPID STREP SCREEN (MED CTR MEBANE ONLY): STREP GP A AG, IA W/REFLEX: NEGATIVE

## 2017-12-26 LAB — CULTURE, GROUP A STREP

## 2017-12-26 MED ORDER — AZITHROMYCIN 250 MG PO TABS: ORAL_TABLET | ORAL | 0 refills | Status: DC

## 2017-12-26 NOTE — Progress Notes (Signed)
Subjective:    Patient ID: Frederick Davis, male    DOB: 1923/08/23, 84 y.o.   MRN: [id number]  HPI Patient here today for cough, congestion and sore throat that started yesterday afternoon.  Patient was just recently admitted to the hospital for removal of a failed penile prosthesis.  He spent one night in the hospital following the surgery.  The wife called this morning and said that he had very severe sore throat that started yesterday afternoon and has since developed some cough and congestion but has had no fever.  The patient was initially placed on cephalexin 500 mg twice daily for 3 days following the surgery.  He is only allergic to atorvastatin.  The patient comes to the visit today with his wife.  He started with a sore throat and burning sensation in his throat late yesterday afternoon and this later developed into a cough which is productive of lime colored sputum.  He has had some head congestion and upper airway congestion.  No fever has been documented.   Patient Active Problem List   Diagnosis Date Noted  . Malfunction of penile prosthesis ([place]) 12/15/2017  . Renal cell carcinoma, right ([place]) 08/11/2017  . Vitamin D deficiency 08/11/2017  . ASCVD (arteriosclerotic cardiovascular disease) 08/11/2017  . Aortic stenosis 05/28/2017  . Mitral regurgitation 05/28/2017  . Chest pain 05/27/2017  . Coronary artery disease involving native coronary artery of native heart without angina pectoris 03/19/2017  . Dyslipidemia 03/19/2017  . S/P CABG x 3 12/08/2015  . Unstable angina ([place])   . Chest pain with moderate risk of acute coronary syndrome 12/05/2015  . Aortic stenosis, mild-2014 12/05/2015  . Anginal pain ([place]) 10/17/2015  . History of renal cell cancer-s/p nephrectomy 07/19/2013  . Hyperlipidemia 07/19/2013  . Hypotension arterial 03/18/2013  . Complete heart block ([place]) 03/18/2013  . Anemia   . CKD (chronic kidney disease) stage 3, GFR 30-59 ml/min (HCC) 03/12/2013  .  Acute MI, inferior wall, initial episode of care ([place]) 03/12/2013  . Impotence 03/12/2013  . History of renal stone 03/12/2013  . Hiatal hernia 03/12/2013  . H/O lithotripsy 03/12/2013  . SCCA (squamous cell carcinoma) of skin 03/12/2013  . S/P prostatectomy, radical 03/12/2013  . Left renal mass 03/12/2013  . Arthritis 03/12/2013  . History of STEMI- May 2014 03/12/2013  . CAD S/P RCA thrombectomy May 2014, CABG 11/2015 03/12/2013  . Cardiogenic shock ([place]) 03/12/2013  . Acute respiratory failure ([place]) 03/12/2013  . Altered mental status 03/12/2013  . Essential hypertension, benign 01/14/2013  . Colostomy in place [facility]) 01/14/2013  . Prostate cancer ([place]) 01/14/2013  . Renal cell carcinoma, Right 01/14/2013  . Osteoporosis, unspecified 01/14/2013   Outpatient Encounter Medications as of 12/26/2017  Medication Sig  . acetaminophen (TYLENOL) 500 MG tablet Take 500 mg by mouth at bedtime.  Marland Kitchen amLODipine (NORVASC) 2.5 MG tablet Take 1 tablet (2.5 mg total) by mouth daily.  Marland Kitchen aspirin 81 MG tablet Take 1 tablet (81 mg total) by mouth daily. (Patient taking differently: Take 81 mg by mouth at bedtime. )  . carvedilol (COREG) 3.125 MG tablet TAKE  (1)  TABLET TWICE A DAY WITH MEALS (BREAKFAST AND SUPPER)  . Cholecalciferol (VITAMIN D3) 3000 units TABS Take 3,000 Units by mouth daily.  . furosemide (LASIX) 20 MG tablet Take 1 tablet daily as needed. (Patient taking differently: Take 20 mgs by mouth once daily as needed for swelling)  . levothyroxine (SYNTHROID, LEVOTHROID) 75 MCG tablet TAKE 1 TABLET  DAILY (Patient taking differently: TAKE 1 TABLET DAILY AT HS)  . Multiple Vitamin (MULTIVITAMIN) capsule Take 1 capsule daily by mouth.  . nitroGLYCERIN (NITROSTAT) 0.4 MG SL tablet PLACE 1 TABLET UNDER THE TONGUE AT ONSET OF CHEST PAIN EVERY 5 MINTUES UP TO 3 TIMES AS NEEDED (Patient taking differently: PLACE 1 TABLET UNDER THE TONGUE AT ONSET OF CHEST PAIN EVERY 5 MINTUES UP TO 3 TIMES AS NEEDED FOR  CHEST PAIN)  . traMADol (ULTRAM) 50 MG tablet Take 1 tablet (50 mg total) by mouth every 6 (six) hours as needed for moderate pain.  . [DISCONTINUED] cephALEXin (KEFLEX) 500 MG capsule Take 1 capsule (500 mg total) by mouth 2 (two) times daily.   Facility-Administered Encounter Medications as of 12/26/2017  Medication  . etomidate (AMIDATE) injection  . lidocaine (cardiac) 100 mg/16ml (XYLOCAINE) 20 MG/ML injection 2%  . succinylcholine (ANECTINE) injection      Review of Systems  Constitutional: Negative.  Negative for fever.  HENT: Positive for congestion and sore throat.   Eyes: Negative.   Respiratory: Positive for cough.   Cardiovascular: Negative.   Gastrointestinal: Negative.   Endocrine: Negative.   Genitourinary: Negative.   Musculoskeletal: Negative.   Skin: Negative.   Allergic/Immunologic: Negative.   Neurological: Negative.   Hematological: Negative.   Psychiatric/Behavioral: Negative.        Objective:   Physical Exam  Constitutional: He is oriented to person, place, and time. He appears well-developed and well-nourished. No distress.  The patient is pleasant and alert but somewhat hoarse  HENT:  Head: Normocephalic and atraumatic.  Right Ear: External ear normal.  Left Ear: External ear normal.  Nose: Nose normal.  Mouth/Throat: Oropharynx is clear and moist. No oropharyngeal exudate.  Minimal ear cerumen bilaterally and TMs appeared normal.  Throat may be slightly red but not severely so.  No anterior cervical adenopathy.  No anterior cervical tenderness.  Nasal pallor.  Eyes: Conjunctivae and EOM are normal. Pupils are equal, round, and reactive to light. Right eye exhibits no discharge. Left eye exhibits no discharge. No scleral icterus.  Neck: Normal range of motion. Neck supple. No thyromegaly present.  Cardiovascular: Normal rate, regular rhythm and normal heart sounds.  No murmur heard. Heart is regular at 60/min  Pulmonary/Chest: Effort normal and  breath sounds normal. No respiratory distress. He has no wheezes. He has no rales.  No rales or wheezes but slight upper airway congestion with coughing  Abdominal: Soft. Bowel sounds are normal. He exhibits no mass. There is no tenderness. There is no rebound and no guarding.  Genitourinary:  Genitourinary Comments: The draining urine catheter has clear urine.  Musculoskeletal: He exhibits no edema.  Patient uses a cane for ambulation  Lymphadenopathy:    He has no cervical adenopathy.  Neurological: He is alert and oriented to person, place, and time.  Skin: Skin is warm and dry. No rash noted.  Psychiatric: He has a normal mood and affect. His behavior is normal. Judgment and thought content normal.  Nursing note and vitals reviewed.  BP 110/68 (BP Location: Left Arm)   Pulse 60   Temp 97.6 F (36.4 C) (Oral)   Ht 5' 4.5" (1.638 m)   Wt 167 lb (75.8 kg)   BMI 28.22 kg/m         Assessment & Plan:  1. Sore throat -This started yesterday. - CBC with Differential/Platelet - Rapid Strep Screen (Not at [facility]) -Take Z-Pak as directed  2. Breakdown (mechanical) of implanted penile  prosthesis, initial encounter [facility]) -The patient is status post removal of this prosthesis and spending 1 night in the hospital for this.  He has a follow-up planned in 4 days to see the urologist again.  The catheter is draining clear urine.  3. Cough -Take Mucinex, maximum strength, playing, blue and white in color 1 twice daily for cough and congestion with a large glass of water -If any worsening condition, patient should call back and be seen again sooner rather than later  Patient Instructions  Take Tylenol for aches pains and fever every 4-6 hours Continue to drink plenty of fluids especially water Use Mucinex maximum strength, blue and white in color, 1 twice daily with a large glass of water whether coughing or not Use nasal saline 1 spray each nostril at least 3 or 4 times daily Take  antibiotic as directed  Arrie Senate MD

## 2017-12-26 NOTE — Patient Instructions (Signed)
Take Tylenol for aches pains and fever every 4-6 hours Continue to drink plenty of fluids especially water Use Mucinex maximum strength, blue and white in color, 1 twice daily with a large glass of water whether coughing or not Use nasal saline 1 spray each nostril at least 3 or 4 times daily Take antibiotic as directed

## 2017-12-27 ENCOUNTER — Other Ambulatory Visit: Payer: Self-pay | Admitting: Family Medicine

## 2017-12-27 LAB — CBC WITH DIFFERENTIAL/PLATELET
BASOS: 1 %
Basophils Absolute: 0.1 10*3/uL (ref 0.0–0.2)
EOS (ABSOLUTE): 0.5 10*3/uL — AB (ref 0.0–0.4)
EOS: 5 %
HEMATOCRIT: 36.7 % — AB (ref 37.5–51.0)
Hemoglobin: 12 g/dL — ABNORMAL LOW (ref 13.0–17.7)
IMMATURE GRANS (ABS): 0 10*3/uL (ref 0.0–0.1)
Immature Granulocytes: 0 %
LYMPHS: 16 %
Lymphocytes Absolute: 1.6 10*3/uL (ref 0.7–3.1)
MCH: 27.2 pg (ref 26.6–33.0)
MCHC: 32.7 g/dL (ref 31.5–35.7)
MCV: 83 fL (ref 79–97)
MONOS ABS: 0.7 10*3/uL (ref 0.1–0.9)
Monocytes: 6 %
NEUTROS ABS: 7.5 10*3/uL — AB (ref 1.4–7.0)
Neutrophils: 72 %
PLATELETS: 245 10*3/uL (ref 150–379)
RBC: 4.41 x10E6/uL (ref 4.14–5.80)
RDW: 14.6 % (ref 12.3–15.4)
WBC: 10.4 10*3/uL (ref 3.4–10.8)

## 2017-12-30 ENCOUNTER — Ambulatory Visit (HOSPITAL_COMMUNITY)
Admission: RE | Admit: 2017-12-30 | Discharge: 2017-12-30 | Disposition: A | Discharge status: Home / Self Care | Payer: Medicare Other | Source: Ambulatory Visit | Attending: Urology | Admitting: Urology

## 2017-12-30 DIAGNOSIS — Z9079 Acquired absence of other genital organ(s): Secondary | ICD-10-CM | POA: Diagnosis not present

## 2017-12-30 DIAGNOSIS — T83410D Breakdown (mechanical) of penile (implanted) prosthesis, subsequent encounter: Secondary | ICD-10-CM | POA: Diagnosis not present

## 2017-12-30 DIAGNOSIS — T83410A Breakdown (mechanical) of penile (implanted) prosthesis, initial encounter: Secondary | ICD-10-CM | POA: Diagnosis not present

## 2017-12-30 MED ORDER — LIDOCAINE HCL 2 % EX GEL
CUTANEOUS | Status: AC
  Administered 2017-12-30: 12:00:00 via URETHRAL
  Filled 2017-12-30: qty 30

## 2017-12-30 MED ORDER — IOPAMIDOL (ISOVUE-300) INJECTION 61%
INTRAVENOUS | Status: AC
  Administered 2017-12-30: 50 mL
  Filled 2017-12-30: qty 50

## 2017-12-30 MED ORDER — IOPAMIDOL (ISOVUE-300) INJECTION 61%: 50.0000 mL | Freq: Once | INTRAVENOUS | Status: DC | PRN

## 2018-01-14 ENCOUNTER — Ambulatory Visit (INDEPENDENT_AMBULATORY_CARE_PROVIDER_SITE_OTHER): Payer: Medicare Other

## 2018-01-14 ENCOUNTER — Encounter: Payer: Self-pay | Admitting: Family Medicine

## 2018-01-14 ENCOUNTER — Ambulatory Visit (INDEPENDENT_AMBULATORY_CARE_PROVIDER_SITE_OTHER): Payer: Medicare Other | Admitting: Family Medicine

## 2018-01-14 VITALS — BP 107/63 | HR 52 | Temp 97.1°F | Ht 64.5 in | Wt 165.0 lb

## 2018-01-14 DIAGNOSIS — R05 Cough: Secondary | ICD-10-CM | POA: Diagnosis not present

## 2018-01-14 DIAGNOSIS — J40 Bronchitis, not specified as acute or chronic: Secondary | ICD-10-CM

## 2018-01-14 DIAGNOSIS — T83410S Breakdown (mechanical) of penile (implanted) prosthesis, sequela: Secondary | ICD-10-CM

## 2018-01-14 DIAGNOSIS — J209 Acute bronchitis, unspecified: Secondary | ICD-10-CM

## 2018-01-14 MED ORDER — AZITHROMYCIN 250 MG PO TABS: ORAL_TABLET | ORAL | 0 refills | Status: DC

## 2018-01-14 MED ORDER — PREDNISONE 10 MG PO TABS: ORAL_TABLET | ORAL | 0 refills | Status: DC

## 2018-01-14 MED ORDER — METHYLPREDNISOLONE ACETATE 80 MG/ML IJ SUSP
60.0000 mg | Freq: Once | INTRAMUSCULAR | Status: AC
  Administered 2018-01-14: 60 mg via INTRAMUSCULAR

## 2018-01-14 NOTE — Progress Notes (Signed)
Subjective:    Patient ID: Frederick Davis, male    DOB: [birth year], 84 y.o.   MRN: [id number]  HPI  Patient here today for cough and congestion.  Patient is still on Keflex for an indefinite period of time because of urological surgery and the need to prevent infection.  He is also on Mucinex daily.  His cough and congestion have been going on for greater than 2 weeks and it seemed to get worse the past weekend.  The patient comes to the visit today with his wife.  They have had some basement wall issues which recently collapsed.  And she was concerned that this could be playing a role with his bronchial congestion.  They continue to be followed by the urologist.    Patient Active Problem List   Diagnosis Date Noted  . Malfunction of penile prosthesis ([place]) 12/15/2017  . Renal cell carcinoma, right ([place]) 08/11/2017  . Vitamin D deficiency 08/11/2017  . ASCVD (arteriosclerotic cardiovascular disease) 08/11/2017  . Aortic stenosis 05/28/2017  . Mitral regurgitation 05/28/2017  . Chest pain 05/27/2017  . Coronary artery disease involving native coronary artery of native heart without angina pectoris 03/19/2017  . Dyslipidemia 03/19/2017  . S/P CABG x 3 12/08/2015  . Unstable angina ([place])   . Chest pain with moderate risk of acute coronary syndrome 12/05/2015  . Aortic stenosis, mild-2014 12/05/2015  . Anginal pain ([place]) 10/17/2015  . History of renal cell cancer-s/p nephrectomy 07/19/2013  . Hyperlipidemia 07/19/2013  . Hypotension arterial 03/18/2013  . Complete heart block ([place]) 03/18/2013  . Anemia   . CKD (chronic kidney disease) stage 3, GFR 30-59 ml/min (HCC) 03/12/2013  . Acute MI, inferior wall, initial episode of care ([place]) 03/12/2013  . Impotence 03/12/2013  . History of renal stone 03/12/2013  . Hiatal hernia 03/12/2013  . H/O lithotripsy 03/12/2013  . SCCA (squamous cell carcinoma) of skin 03/12/2013  . S/P prostatectomy, radical 03/12/2013  . Left renal mass  03/12/2013  . Arthritis 03/12/2013  . History of STEMI- May 2014 03/12/2013  . CAD S/P RCA thrombectomy May 2014, CABG 11/2015 03/12/2013  . Cardiogenic shock ([place]) 03/12/2013  . Acute respiratory failure ([place]) 03/12/2013  . Altered mental status 03/12/2013  . Essential hypertension, benign 01/14/2013  . Colostomy in place [facility]) 01/14/2013  . Prostate cancer ([place]) 01/14/2013  . Renal cell carcinoma, Right 01/14/2013  . Osteoporosis, unspecified 01/14/2013   Outpatient Encounter Medications as of 01/14/2018  Medication Sig  . acetaminophen (TYLENOL) 500 MG tablet Take 500 mg by mouth at bedtime.  Marland Kitchen amLODipine (NORVASC) 2.5 MG tablet Take 1 tablet (2.5 mg total) by mouth daily.  Marland Kitchen aspirin 81 MG tablet Take 1 tablet (81 mg total) by mouth daily. (Patient taking differently: Take 81 mg by mouth at bedtime. )  . carvedilol (COREG) 3.125 MG tablet TAKE  (1)  TABLET TWICE A DAY WITH MEALS (BREAKFAST AND SUPPER)  . cephALEXin (KEFLEX) 250 MG capsule Take by mouth 2 (two) times daily. From urology -- LONG TERM  . Cholecalciferol (VITAMIN D3) 3000 units TABS Take 3,000 Units by mouth daily.  . furosemide (LASIX) 20 MG tablet Take 1 tablet daily as needed. (Patient taking differently: Take 20 mgs by mouth once daily as needed for swelling)  . levothyroxine (SYNTHROID, LEVOTHROID) 75 MCG tablet TAKE 1 TABLET DAILY (Patient taking differently: TAKE 1 TABLET DAILY AT HS)  . Multiple Vitamin (MULTIVITAMIN) capsule Take 1 capsule daily by mouth.  . nitroGLYCERIN (NITROSTAT) 0.4 MG  SL tablet PLACE 1 TABLET UNDER THE TONGUE AT ONSET OF CHEST PAIN EVERY 5 MINTUES UP TO 3 TIMES AS NEEDED (Patient taking differently: PLACE 1 TABLET UNDER THE TONGUE AT ONSET OF CHEST PAIN EVERY 5 MINTUES UP TO 3 TIMES AS NEEDED FOR CHEST PAIN)  . traMADol (ULTRAM) 50 MG tablet Take 1 tablet (50 mg total) by mouth every 6 (six) hours as needed for moderate pain.  . [DISCONTINUED] azithromycin (ZITHROMAX) 250 MG tablet As directed     Facility-Administered Encounter Medications as of 01/14/2018  Medication  . etomidate (AMIDATE) injection  . lidocaine (cardiac) 100 mg/74ml (XYLOCAINE) 20 MG/ML injection 2%  . succinylcholine (ANECTINE) injection     Review of Systems  Constitutional: Negative.  Negative for fever.  HENT: Positive for congestion, nosebleeds and postnasal drip.   Eyes: Negative.   Respiratory: Positive for choking.   Cardiovascular: Negative.   Gastrointestinal: Negative.   Endocrine: Negative.   Genitourinary: Negative.   Musculoskeletal: Negative.   Skin: Negative.   Allergic/Immunologic: Negative.   Neurological: Negative.   Hematological: Negative.   Psychiatric/Behavioral: Negative.        Objective:   Physical Exam  Constitutional: He is oriented to person, place, and time. He appears well-developed and well-nourished. No distress.  The patient is pleasant and relaxed  HENT:  Head: Normocephalic and atraumatic.  Right Ear: External ear normal.  Left Ear: External ear normal.  Nose: Nose normal.  Mouth/Throat: Oropharynx is clear and moist. No oropharyngeal exudate.  Eyes: Pupils are equal, round, and reactive to light. Conjunctivae and EOM are normal. Right eye exhibits no discharge. Left eye exhibits no discharge. No scleral icterus.  Neck: Normal range of motion. Neck supple. No thyromegaly present.  Cardiovascular: Normal heart sounds.  No murmur heard. Pulmonary/Chest: Effort normal and breath sounds normal. No respiratory distress. He has no wheezes. He has no rales.  No wheezes.  Upper airway congestion with coughing with coarse cough  Musculoskeletal: Normal range of motion. He exhibits no edema.  Has a walking cane for assistance with ambulation  Lymphadenopathy:    He has no cervical adenopathy.  Neurological: He is alert and oriented to person, place, and time. No cranial nerve deficit.  Skin: Skin is warm and dry. No rash noted.  Psychiatric: He has a normal mood and  affect. His behavior is normal. Judgment and thought content normal.  Nursing note and vitals reviewed.   BP 107/63 (BP Location: Left Arm)   Pulse (!) 52   Temp (!) 97.1 F (36.2 C) (Oral)   Ht 5' 4.5" (1.638 m)   Wt 165 lb (74.8 kg)   BMI 27.88 kg/m     Chest x-ray with results pending === Assessment & Plan:    1. Bronchitis -Zpack - methylPREDNISolone acetate (DEPO-MEDROL) injection 60 mg  2. Bronchitis with bronchospasm -rx for prednisone 10 taper--to hold -Symbicoit 2 puffs twice daily  3. Breakdown (mechanical) of implanted penile prosthesis, sequela -Continue with keflex  Meds ordered this encounter  Medications  . azithromycin (ZITHROMAX) 250 MG tablet    Sig: As directed    Dispense:  6 tablet    Refill:  0  . methylPREDNISolone acetate (DEPO-MEDROL) injection 60 mg  . predniSONE (DELTASONE) 10 MG tablet    Sig: Take 1 tab QID x 2 days, then 1 tab TID x 2 days, then 1 tab BID x 2 days, then 1 tab QD x 2 days.    Dispense:  20 tablet  Refill:  0   Patient Instructions  Use sample inhaler 2 puffs twice daily for the next couple of weeks after using rinse mouth with water Continue to drink plenty of fluids Increase Mucinex to 1200 mg twice daily with a large glass of water.  Make sure it is the plain Mucinex which is blue and white in color Continue to drink plenty of fluids Continue to take Keflex as prescribed by urologist Add Z-Pak and take as directed When white blood cell count is returned and chest x-ray is returned we will determine if you need to take the prednisone taper and call you with that information  Arrie Senate MD

## 2018-01-14 NOTE — Addendum Note (Signed)
Addended by: Zannie Cove on: 01/14/2018 02:23 PM   Modules accepted: Orders

## 2018-01-14 NOTE — Patient Instructions (Signed)
Use sample inhaler 2 puffs twice daily for the next couple of weeks after using rinse mouth with water Continue to drink plenty of fluids Increase Mucinex to 1200 mg twice daily with a large glass of water.  Make sure it is the plain Mucinex which is blue and white in color Continue to drink plenty of fluids Continue to take Keflex as prescribed by urologist Add Z-Pak and take as directed When white blood cell count is returned and chest x-ray is returned we will determine if you need to take the prednisone taper and call you with that information

## 2018-01-15 LAB — CBC WITH DIFFERENTIAL/PLATELET
BASOS ABS: 0 10*3/uL (ref 0.0–0.2)
Basos: 0 %
EOS (ABSOLUTE): 0.2 10*3/uL (ref 0.0–0.4)
Eos: 2 %
HEMOGLOBIN: 11.6 g/dL — AB (ref 13.0–17.7)
Hematocrit: 36 % — ABNORMAL LOW (ref 37.5–51.0)
IMMATURE GRANS (ABS): 0 10*3/uL (ref 0.0–0.1)
Immature Granulocytes: 0 %
LYMPHS ABS: 1.6 10*3/uL (ref 0.7–3.1)
LYMPHS: 20 %
MCH: 26.7 pg (ref 26.6–33.0)
MCHC: 32.2 g/dL (ref 31.5–35.7)
MCV: 83 fL (ref 79–97)
Monocytes Absolute: 1 10*3/uL — ABNORMAL HIGH (ref 0.1–0.9)
Monocytes: 12 %
Neutrophils Absolute: 5.3 10*3/uL (ref 1.4–7.0)
Neutrophils: 66 %
PLATELETS: 248 10*3/uL (ref 150–379)
RBC: 4.35 x10E6/uL (ref 4.14–5.80)
RDW: 14.4 % (ref 12.3–15.4)
WBC: 8.1 10*3/uL (ref 3.4–10.8)

## 2018-01-19 ENCOUNTER — Other Ambulatory Visit: Payer: Self-pay | Admitting: Family Medicine

## 2018-01-22 ENCOUNTER — Other Ambulatory Visit: Payer: Self-pay | Admitting: Cardiology

## 2018-01-29 IMAGING — CR DG CHEST 2V
2 series · 2 of 2 positions shown · non-contrast
Comparison: 12/13/2015.

CLINICAL DATA: CABG.

EXAM:
CHEST  2 VIEW

[w chest pa]
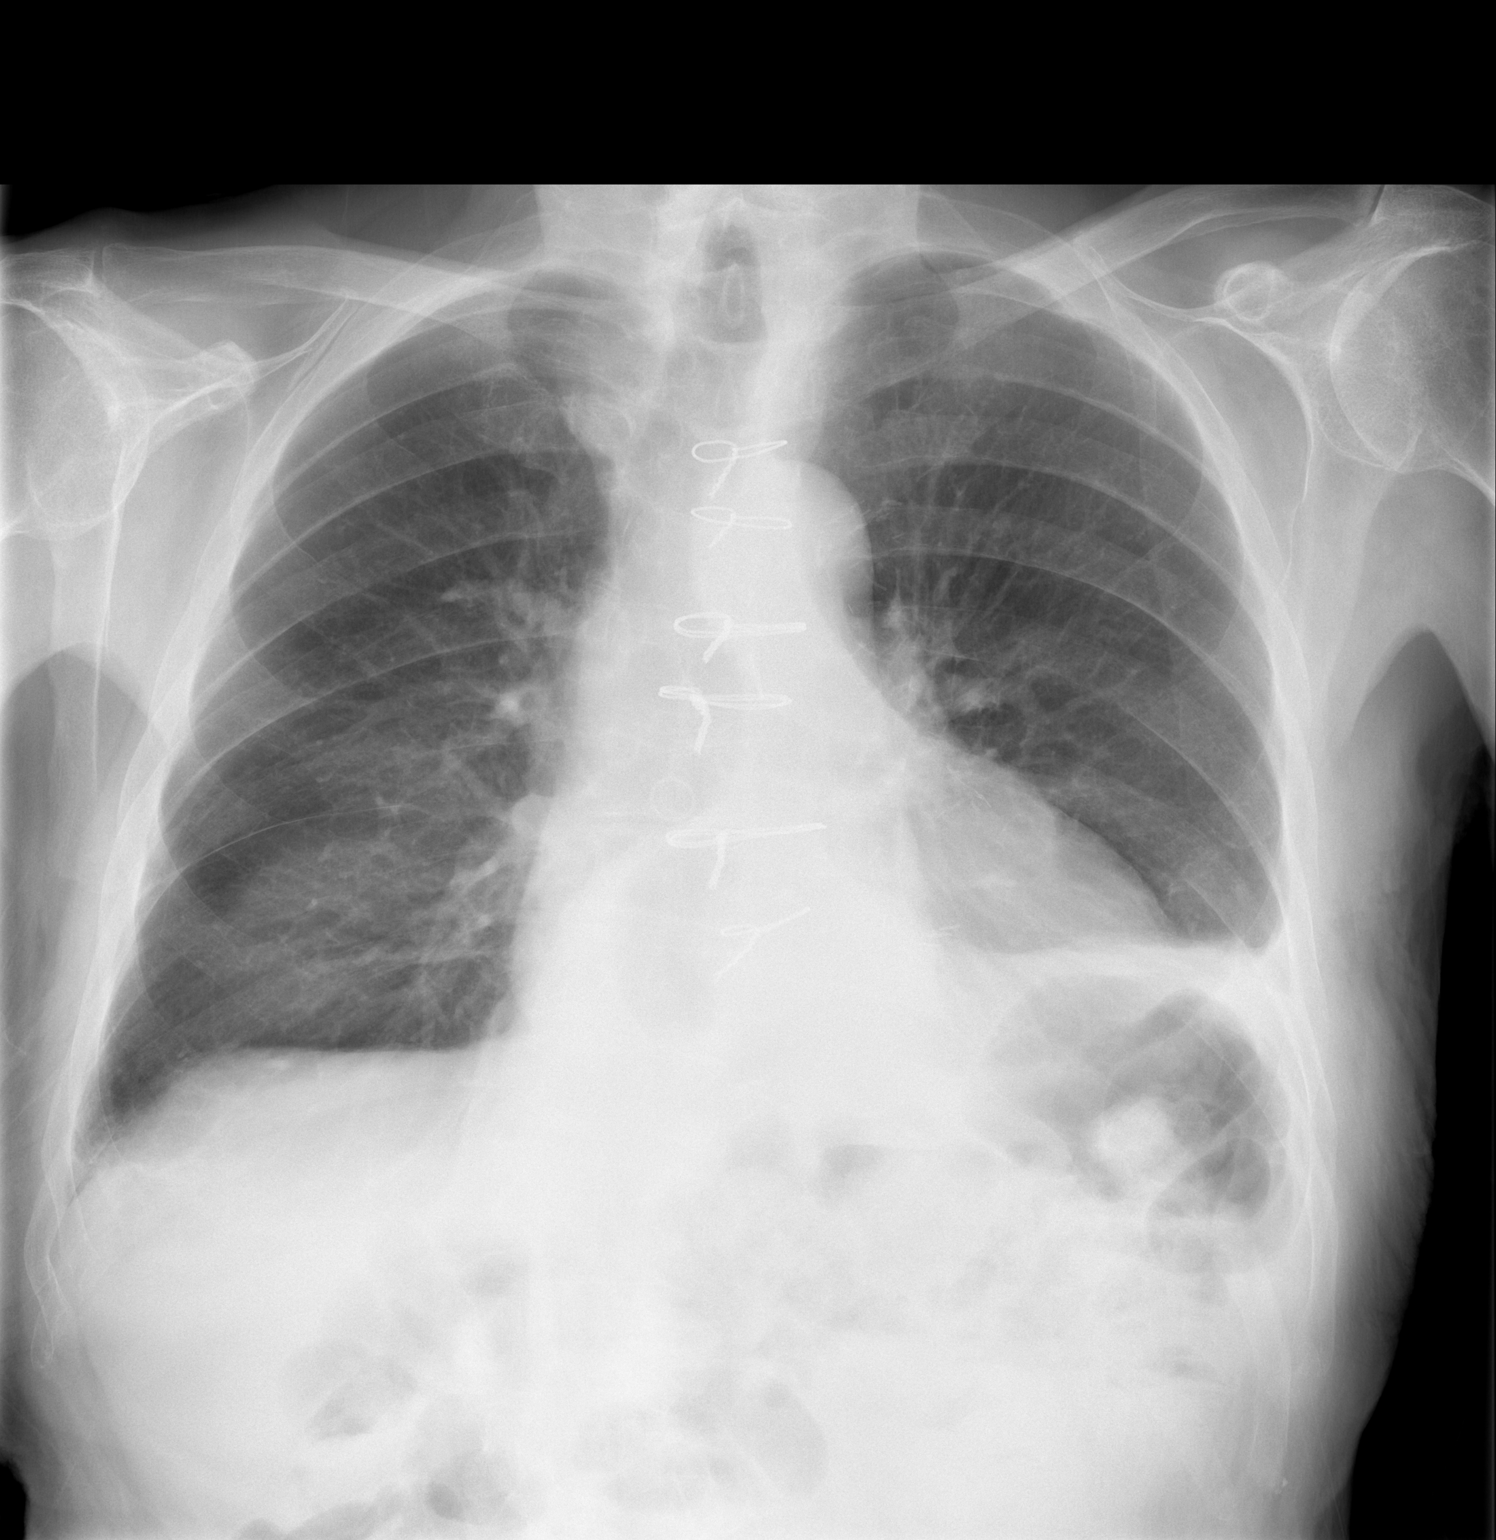

[w chest lat]
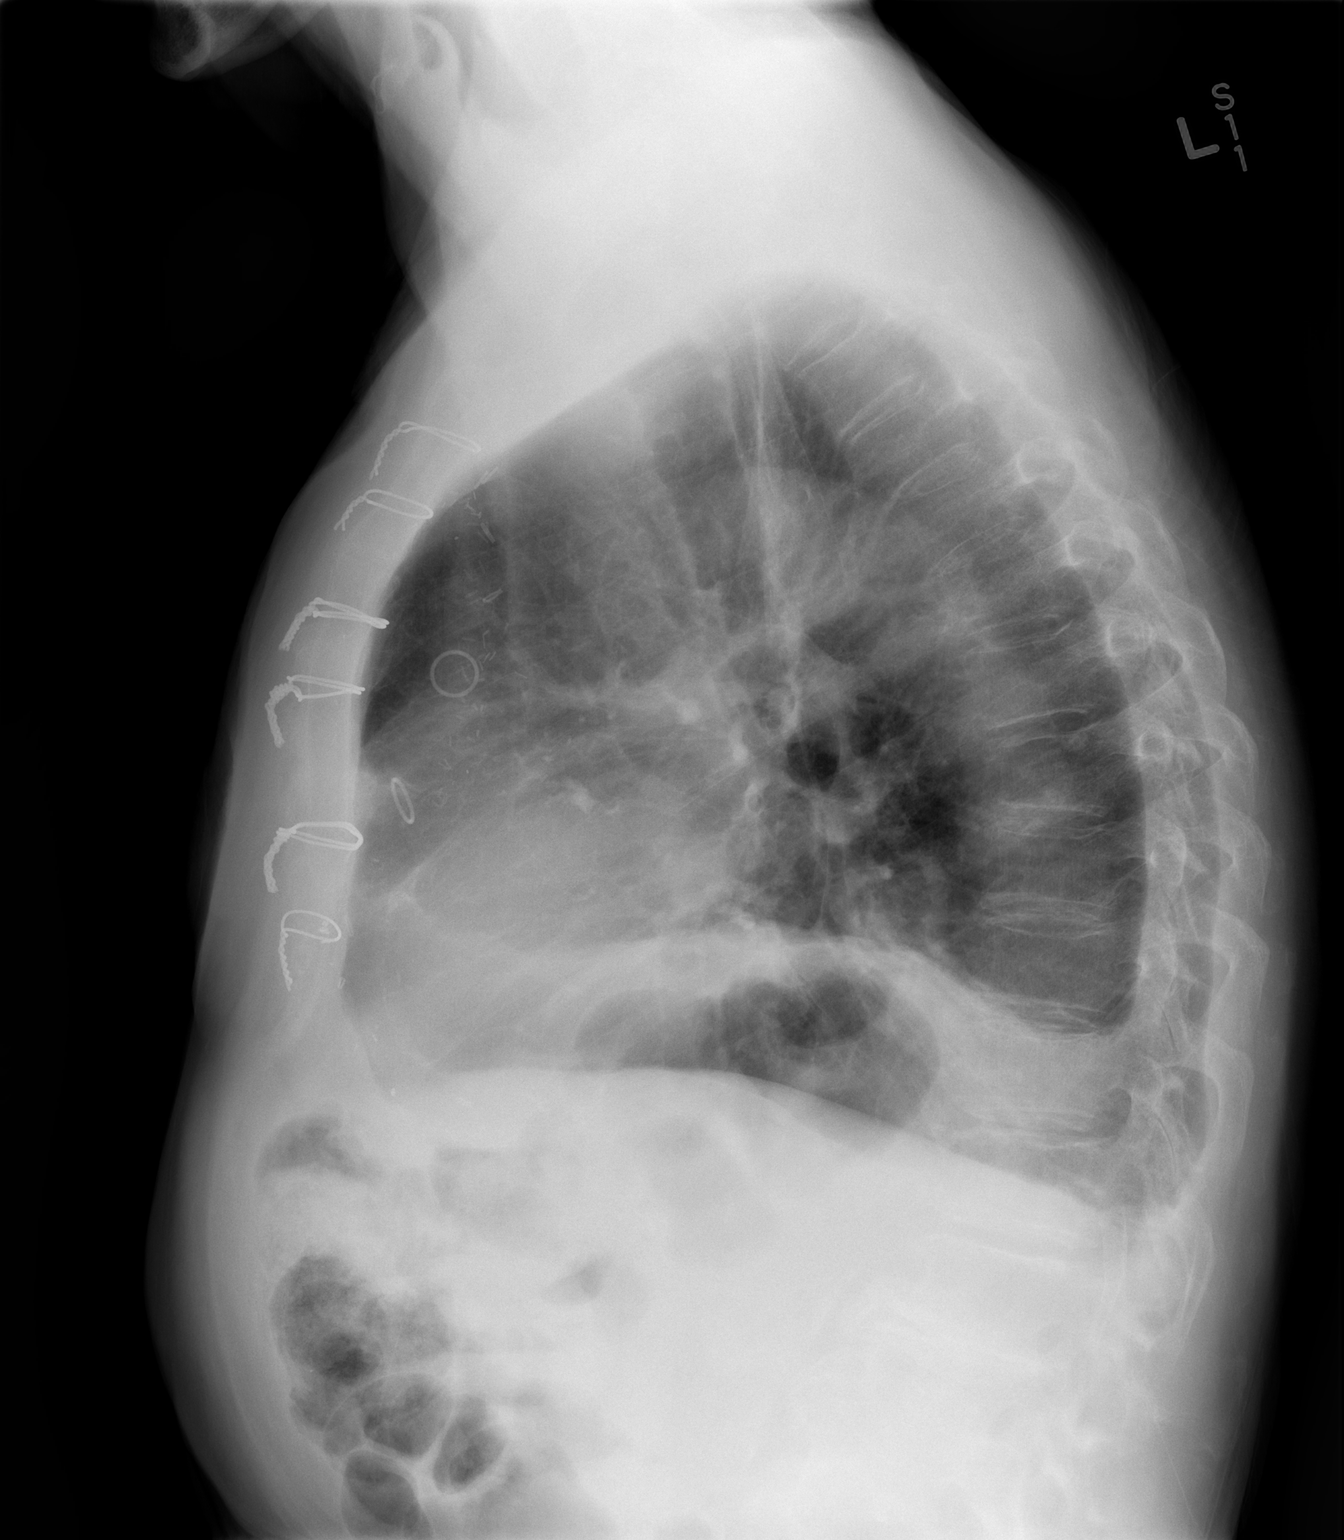

[2 of 2 positions shown; findings below may reference images not displayed]

FINDINGS: Prior CABG. Cardiomegaly with normal pulmonary vascularity.
Persistent bilateral pulmonary infiltrates and pleural effusions.
Congestive heart failure could present this fashion. Bibasilar
pneumonia cannot be excluded. No pneumothorax.
IMPRESSION: Prior CABG. Persistent cardiomegaly with bilateral pulmonary
infiltrates and pleural effusions. Congestive heart failure present
this fashion. Bibasilar pneumonia cannot be excluded . Similar
findings noted on prior exam.

## 2018-02-03 ENCOUNTER — Other Ambulatory Visit: Payer: Self-pay | Admitting: Urology

## 2018-02-17 DIAGNOSIS — T83191A Other mechanical complication of urinary sphincter implant, initial encounter: Secondary | ICD-10-CM | POA: Diagnosis not present

## 2018-02-17 DIAGNOSIS — R8271 Bacteriuria: Secondary | ICD-10-CM | POA: Diagnosis not present

## 2018-02-18 NOTE — Progress Notes (Signed)
Reviewed pt chart for pre-op interview.  Noted multiple cardiac issues.  Reviewed pt chart w/ dr rose mda, face to face.  Dr Kalman Shan MDA stated due to multiple cardiac issue pt should be done at [facility] main OR, pt not [facility] candidate. Called and lvm for selita, or scheduler for dr Diona Fanti, informed her of Dr Kalman Shan recommendation.

## 2018-02-19 NOTE — Patient Instructions (Signed)
Delon Revelo [facility]  02/19/2018   Your procedure is scheduled on: Monday 02/23/2018  Report to [facility] Main  Entrance              Report to admitting at   0700 AM    Call this number if you have problems the morning of surgery [phone]     Remember: Do not eat food or drink liquids :After Midnight.     Take these medicines the morning of surgery with A SIP OF WATER: Amlodipine (Norvasc), Carvedilol (Coreg), Levothyroxine (Synthroid)                                 You may not have any metal on your body including hair pins and              piercings  Do not wear jewelry, make-up, lotions, powders or perfumes, deodorant             Do not wear nail polish.  Do not shave  48 hours prior to surgery.              Men may shave face and neck.   Do not bring valuables to the hospital. [place].  Contacts, dentures or bridgework may not be worn into surgery.  Leave suitcase in the car. After surgery it may be brought to your room.                  Please read over the following fact sheets you were given: _____________________________________________________________________             [facility] - Preparing for Surgery Before surgery, you can play an important role.  Because skin is not sterile, your skin needs to be as free of germs as possible.  You can reduce the number of germs on your skin by washing with CHG (chlorahexidine gluconate) soap before surgery.  CHG is an antiseptic cleaner which kills germs and bonds with the skin to continue killing germs even after washing. Please DO NOT use if you have an allergy to CHG or antibacterial soaps.  If your skin becomes reddened/irritated stop using the CHG and inform your nurse when you arrive at Short Stay. Do not shave (including legs and underarms) for at least 48 hours prior to the first CHG shower.  You may shave your face/neck. Please follow  these instructions carefully:  1.  Shower with CHG Soap the night before surgery and the  morning of Surgery.  2.  If you choose to wash your hair, wash your hair first as usual with your  normal  shampoo.  3.  After you shampoo, rinse your hair and body thoroughly to remove the  shampoo.                           4.  Use CHG as you would any other liquid soap.  You can apply chg directly  to the skin and wash                       Gently with a scrungie or clean washcloth.  5.  Apply the CHG Soap to your  body ONLY FROM THE NECK DOWN.   Do not use on face/ open                           Wound or open sores. Avoid contact with eyes, ears mouth and genitals (private parts).                       Wash face,  Genitals (private parts) with your normal soap.             6.  Wash thoroughly, paying special attention to the area where your surgery  will be performed.  7.  Thoroughly rinse your body with warm water from the neck down.  8.  DO NOT shower/wash with your normal soap after using and rinsing off  the CHG Soap.                9.  Pat yourself dry with a clean towel.            10.  Wear clean pajamas.            11.  Place clean sheets on your bed the night of your first shower and do not  sleep with pets. Day of Surgery : Do not apply any lotions/deodorants the morning of surgery.  Please wear clean clothes to the hospital/surgery center.  FAILURE TO FOLLOW THESE INSTRUCTIONS MAY RESULT IN THE CANCELLATION OF YOUR SURGERY PATIENT SIGNATURE_________________________________  NURSE SIGNATURE__________________________________  ________________________________________________________________________

## 2018-02-19 NOTE — Progress Notes (Signed)
12/08/2017- Cardiac clearance from [facility] Def[facility], PA for Dr. Percival Spanish in Epic and EKG   11/25/2017- noted in Epic- Holter monitor 24 hour.  01/15/2018- noted in [place]- CXR  06/07/2017- noted in Epic-ECHO

## 2018-02-20 ENCOUNTER — Encounter (HOSPITAL_COMMUNITY)
Admission: RE | Admit: 2018-02-20 | Discharge: 2018-02-20 | Disposition: A | Discharge status: Home / Self Care | Payer: Medicare Other | Source: Ambulatory Visit | Attending: Urology | Admitting: Urology

## 2018-02-20 ENCOUNTER — Encounter (HOSPITAL_COMMUNITY): Payer: Self-pay

## 2018-02-20 ENCOUNTER — Other Ambulatory Visit: Payer: Self-pay

## 2018-02-20 DIAGNOSIS — N393 Stress incontinence (female) (male): Secondary | ICD-10-CM | POA: Diagnosis not present

## 2018-02-20 DIAGNOSIS — Z79890 Hormone replacement therapy (postmenopausal): Secondary | ICD-10-CM | POA: Diagnosis not present

## 2018-02-20 DIAGNOSIS — Z85038 Personal history of other malignant neoplasm of large intestine: Secondary | ICD-10-CM | POA: Diagnosis not present

## 2018-02-20 DIAGNOSIS — I252 Old myocardial infarction: Secondary | ICD-10-CM | POA: Diagnosis not present

## 2018-02-20 DIAGNOSIS — I2510 Atherosclerotic heart disease of native coronary artery without angina pectoris: Secondary | ICD-10-CM | POA: Diagnosis not present

## 2018-02-20 DIAGNOSIS — Z7982 Long term (current) use of aspirin: Secondary | ICD-10-CM | POA: Diagnosis not present

## 2018-02-20 DIAGNOSIS — Y831 Surgical operation with implant of artificial internal device as the cause of abnormal reaction of the patient, or of later complication, without mention of misadventure at the time of the procedure: Secondary | ICD-10-CM | POA: Diagnosis not present

## 2018-02-20 DIAGNOSIS — Z951 Presence of aortocoronary bypass graft: Secondary | ICD-10-CM | POA: Diagnosis not present

## 2018-02-20 DIAGNOSIS — E039 Hypothyroidism, unspecified: Secondary | ICD-10-CM | POA: Diagnosis not present

## 2018-02-20 DIAGNOSIS — Z85048 Personal history of other malignant neoplasm of rectum, rectosigmoid junction, and anus: Secondary | ICD-10-CM | POA: Diagnosis not present

## 2018-02-20 DIAGNOSIS — Z87891 Personal history of nicotine dependence: Secondary | ICD-10-CM | POA: Diagnosis not present

## 2018-02-20 DIAGNOSIS — Z9079 Acquired absence of other genital organ(s): Secondary | ICD-10-CM | POA: Diagnosis not present

## 2018-02-20 DIAGNOSIS — Z8546 Personal history of malignant neoplasm of prostate: Secondary | ICD-10-CM | POA: Diagnosis not present

## 2018-02-20 DIAGNOSIS — M810 Age-related osteoporosis without current pathological fracture: Secondary | ICD-10-CM | POA: Diagnosis not present

## 2018-02-20 DIAGNOSIS — D649 Anemia, unspecified: Secondary | ICD-10-CM | POA: Diagnosis not present

## 2018-02-20 DIAGNOSIS — Z85528 Personal history of other malignant neoplasm of kidney: Secondary | ICD-10-CM | POA: Diagnosis not present

## 2018-02-20 DIAGNOSIS — Z79899 Other long term (current) drug therapy: Secondary | ICD-10-CM | POA: Diagnosis not present

## 2018-02-20 DIAGNOSIS — N183 Chronic kidney disease, stage 3 (moderate): Secondary | ICD-10-CM | POA: Diagnosis not present

## 2018-02-20 DIAGNOSIS — T83118A Breakdown (mechanical) of other urinary devices and implants, initial encounter: Secondary | ICD-10-CM | POA: Diagnosis not present

## 2018-02-20 DIAGNOSIS — I129 Hypertensive chronic kidney disease with stage 1 through stage 4 chronic kidney disease, or unspecified chronic kidney disease: Secondary | ICD-10-CM | POA: Diagnosis not present

## 2018-02-20 LAB — BASIC METABOLIC PANEL
Anion gap: 9 (ref 5–15)
BUN: 31 mg/dL — ABNORMAL HIGH (ref 6–20)
CHLORIDE: 108 mmol/L (ref 101–111)
CO2: 24 mmol/L (ref 22–32)
CREATININE: 1.5 mg/dL — AB (ref 0.61–1.24)
Calcium: 9.6 mg/dL (ref 8.9–10.3)
GFR calc non Af Amer: 38 mL/min — ABNORMAL LOW (ref >60)
GFR, EST AFRICAN AMERICAN: 44 mL/min — AB (ref >60)
Glucose, Bld: 104 mg/dL — ABNORMAL HIGH (ref 65–99)
Potassium: 5.3 mmol/L — ABNORMAL HIGH (ref 3.5–5.1)
Sodium: 141 mmol/L (ref 135–145)

## 2018-02-20 LAB — CBC
HCT: 38.5 % — ABNORMAL LOW (ref 39.0–52.0)
Hemoglobin: 12 g/dL — ABNORMAL LOW (ref 13.0–17.0)
MCH: 26.9 pg (ref 26.0–34.0)
MCHC: 31.2 g/dL (ref 30.0–36.0)
MCV: 86.3 fL (ref 78.0–100.0)
PLATELETS: 254 10*3/uL (ref 150–400)
RBC: 4.46 MIL/uL (ref 4.22–5.81)
RDW: 15.4 % (ref 11.5–15.5)
WBC: 7.2 10*3/uL (ref 4.0–10.5)

## 2018-02-20 NOTE — Progress Notes (Signed)
Consulted with Dr. Valma Cava ,MDA via phone about patient's medical history ,results of ECHO from 05/28/2017.  Patient has no new medical problems and has a Cardiology clearance from [facility], [place] for Dr. Percival Spanish on 12/08/2017 in [place].  Per Dr. Valma Cava, MDA, patient is OK for surgery.

## 2018-02-22 NOTE — Anesthesia Preprocedure Evaluation (Addendum)
Anesthesia Evaluation  Patient identified by MRN, date of birth, ID band Patient awake    Reviewed: Allergy & Precautions, H&P , NPO status , Patient's Chart, lab work & pertinent test results, reviewed documented beta blocker date and time   Airway Mallampati: III  TM Distance: >3 FB Neck ROM: Full    Dental  (+) Caps, Teeth Intact   Pulmonary former smoker,     + decreased breath sounds      Cardiovascular hypertension, + angina + CAD and + Past MI  + dysrhythmias  Rhythm:Regular Rate:Normal + Systolic murmurs ECHO 5/62  - Left ventricle: The cavity size was normal. Wall thickness was   increased in a pattern of moderate LVH. Systolic function was   normal. The estimated ejection fraction was in the range of 60%   to 65%. Wall motion was normal; there were no regional wall   motion abnormalities. The study is not technically sufficient to   allow evaluation of LV diastolic function. - Aortic valve: Severely calcified annulus. Trileaflet; severely   thickened leaflets. There was mild to moderate stenosis. Mean   gradient (S): 14 mm Hg. Valve area (VTI): 1.14 cm^2. Valve area   Neuro/Psych negative psych ROS   GI/Hepatic Neg liver ROS, hiatal hernia,   Endo/Other  Hypothyroidism   Renal/GU Renal disease  negative genitourinary   Musculoskeletal  (+) Arthritis ,   Abdominal   Peds  Hematology   Anesthesia Other Findings   Reproductive/Obstetrics negative OB ROS                            Anesthesia Physical  Anesthesia Plan  ASA: III  Anesthesia Plan: General   Post-op Pain Management:    Induction: Intravenous  PONV Risk Score and Plan: 2 and Ondansetron and Treatment may vary due to age or medical condition  Airway Management Planned: LMA and Oral ETT  Additional Equipment: None  Intra-op Plan:   Post-operative Plan: Extubation in OR  Informed Consent: I have reviewed  the patients History and Physical, chart, labs and discussed the procedure including the risks, benefits and alternatives for the proposed anesthesia with the patient or authorized representative who has indicated his/her understanding and acceptance.   Dental advisory given  Plan Discussed with: CRNA, Anesthesiologist and Surgeon  Anesthesia Plan Comments: (  )        Anesthesia Quick Evaluation

## 2018-02-23 ENCOUNTER — Other Ambulatory Visit: Payer: Self-pay

## 2018-02-23 ENCOUNTER — Ambulatory Visit (HOSPITAL_COMMUNITY): Payer: Medicare Other | Admitting: Anesthesiology

## 2018-02-23 ENCOUNTER — Encounter (HOSPITAL_COMMUNITY): Payer: Self-pay | Admitting: Emergency Medicine

## 2018-02-23 ENCOUNTER — Encounter (HOSPITAL_COMMUNITY)
Admission: RE | Disposition: A | Discharge status: Home / Self Care | Payer: Self-pay | Source: Ambulatory Visit | Attending: Urology

## 2018-02-23 ENCOUNTER — Observation Stay (HOSPITAL_COMMUNITY)
Admission: RE | Admit: 2018-02-23 | Discharge: 2018-02-24 | Disposition: A | Discharge status: Home / Self Care | Payer: Medicare Other | Source: Ambulatory Visit | Attending: Urology | Admitting: Urology

## 2018-02-23 DIAGNOSIS — Z85038 Personal history of other malignant neoplasm of large intestine: Secondary | ICD-10-CM | POA: Insufficient documentation

## 2018-02-23 DIAGNOSIS — N183 Chronic kidney disease, stage 3 (moderate): Secondary | ICD-10-CM | POA: Diagnosis not present

## 2018-02-23 DIAGNOSIS — Z87891 Personal history of nicotine dependence: Secondary | ICD-10-CM | POA: Diagnosis not present

## 2018-02-23 DIAGNOSIS — Z79899 Other long term (current) drug therapy: Secondary | ICD-10-CM | POA: Diagnosis not present

## 2018-02-23 DIAGNOSIS — T83118A Breakdown (mechanical) of other urinary devices and implants, initial encounter: Principal | ICD-10-CM | POA: Insufficient documentation

## 2018-02-23 DIAGNOSIS — Z79890 Hormone replacement therapy (postmenopausal): Secondary | ICD-10-CM | POA: Insufficient documentation

## 2018-02-23 DIAGNOSIS — Z85528 Personal history of other malignant neoplasm of kidney: Secondary | ICD-10-CM | POA: Insufficient documentation

## 2018-02-23 DIAGNOSIS — Z7982 Long term (current) use of aspirin: Secondary | ICD-10-CM | POA: Insufficient documentation

## 2018-02-23 DIAGNOSIS — Z951 Presence of aortocoronary bypass graft: Secondary | ICD-10-CM | POA: Insufficient documentation

## 2018-02-23 DIAGNOSIS — I129 Hypertensive chronic kidney disease with stage 1 through stage 4 chronic kidney disease, or unspecified chronic kidney disease: Secondary | ICD-10-CM | POA: Insufficient documentation

## 2018-02-23 DIAGNOSIS — D649 Anemia, unspecified: Secondary | ICD-10-CM | POA: Diagnosis not present

## 2018-02-23 DIAGNOSIS — Z8546 Personal history of malignant neoplasm of prostate: Secondary | ICD-10-CM | POA: Insufficient documentation

## 2018-02-23 DIAGNOSIS — N393 Stress incontinence (female) (male): Secondary | ICD-10-CM | POA: Insufficient documentation

## 2018-02-23 DIAGNOSIS — T83191A Other mechanical complication of urinary sphincter implant, initial encounter: Secondary | ICD-10-CM | POA: Diagnosis not present

## 2018-02-23 DIAGNOSIS — E039 Hypothyroidism, unspecified: Secondary | ICD-10-CM | POA: Diagnosis not present

## 2018-02-23 DIAGNOSIS — Z9079 Acquired absence of other genital organ(s): Secondary | ICD-10-CM | POA: Insufficient documentation

## 2018-02-23 DIAGNOSIS — E785 Hyperlipidemia, unspecified: Secondary | ICD-10-CM | POA: Diagnosis not present

## 2018-02-23 DIAGNOSIS — Y831 Surgical operation with implant of artificial internal device as the cause of abnormal reaction of the patient, or of later complication, without mention of misadventure at the time of the procedure: Secondary | ICD-10-CM | POA: Insufficient documentation

## 2018-02-23 DIAGNOSIS — I2510 Atherosclerotic heart disease of native coronary artery without angina pectoris: Secondary | ICD-10-CM | POA: Insufficient documentation

## 2018-02-23 DIAGNOSIS — Z85048 Personal history of other malignant neoplasm of rectum, rectosigmoid junction, and anus: Secondary | ICD-10-CM | POA: Insufficient documentation

## 2018-02-23 DIAGNOSIS — I252 Old myocardial infarction: Secondary | ICD-10-CM | POA: Diagnosis not present

## 2018-02-23 DIAGNOSIS — M810 Age-related osteoporosis without current pathological fracture: Secondary | ICD-10-CM | POA: Insufficient documentation

## 2018-02-23 DIAGNOSIS — T83111A Breakdown (mechanical) of urinary sphincter implant, initial encounter: Secondary | ICD-10-CM | POA: Diagnosis present

## 2018-02-23 HISTORY — PX: REMOVAL OF PENILE PROSTHESIS: SHX6059

## 2018-02-23 SURGERY — REMOVAL, PENILE PROSTHESIS
Anesthesia: General

## 2018-02-23 MED ORDER — ONDANSETRON HCL 4 MG/2ML IJ SOLN: 4.0000 mg | INTRAMUSCULAR | Status: DC | PRN

## 2018-02-23 MED ORDER — FUROSEMIDE 20 MG PO TABS
20.0000 mg | ORAL_TABLET | Freq: Every day | ORAL | Status: DC
  Administered 2018-02-23: 20 mg via ORAL
  Filled 2018-02-23 (×2): qty 1

## 2018-02-23 MED ORDER — NITROGLYCERIN 0.4 MG SL SUBL: 0.4000 mg | SUBLINGUAL_TABLET | SUBLINGUAL | Status: DC | PRN

## 2018-02-23 MED ORDER — ACETAMINOPHEN 500 MG PO TABS
500.0000 mg | ORAL_TABLET | Freq: Every day | ORAL | Status: DC
  Administered 2018-02-23: 500 mg via ORAL
  Filled 2018-02-23: qty 1

## 2018-02-23 MED ORDER — HYDROMORPHONE HCL 1 MG/ML IJ SOLN
0.5000 mg | INTRAMUSCULAR | Status: DC | PRN
  Filled 2018-02-23: qty 1

## 2018-02-23 MED ORDER — EPHEDRINE 5 MG/ML INJ
INTRAVENOUS | Status: AC
  Filled 2018-02-23: qty 10

## 2018-02-23 MED ORDER — BUPIVACAINE HCL (PF) 0.5 % IJ SOLN
INTRAMUSCULAR | Status: AC
  Filled 2018-02-23: qty 30

## 2018-02-23 MED ORDER — OXYCODONE HCL 5 MG PO TABS
5.0000 mg | ORAL_TABLET | ORAL | Status: DC | PRN
  Administered 2018-02-23 (×2): 5 mg via ORAL
  Filled 2018-02-23 (×2): qty 1

## 2018-02-23 MED ORDER — LACTATED RINGERS IV SOLN
INTRAVENOUS | Status: DC
  Administered 2018-02-23 (×2): via INTRAVENOUS

## 2018-02-23 MED ORDER — PROPOFOL 10 MG/ML IV BOLUS
INTRAVENOUS | Status: AC
  Filled 2018-02-23: qty 20

## 2018-02-23 MED ORDER — SENNA 8.6 MG PO TABS
1.0000 | ORAL_TABLET | Freq: Two times a day (BID) | ORAL | Status: DC
  Administered 2018-02-23: 8.6 mg via ORAL
  Filled 2018-02-23 (×2): qty 1

## 2018-02-23 MED ORDER — PHENYLEPHRINE 40 MCG/ML (10ML) SYRINGE FOR IV PUSH (FOR BLOOD PRESSURE SUPPORT)
PREFILLED_SYRINGE | INTRAVENOUS | Status: DC | PRN
  Administered 2018-02-23 (×3): 80 ug via INTRAVENOUS

## 2018-02-23 MED ORDER — ONDANSETRON HCL 4 MG/2ML IJ SOLN
INTRAMUSCULAR | Status: DC | PRN
  Administered 2018-02-23: 4 mg via INTRAVENOUS

## 2018-02-23 MED ORDER — FENTANYL CITRATE (PF) 100 MCG/2ML IJ SOLN: 25.0000 ug | INTRAMUSCULAR | Status: DC | PRN

## 2018-02-23 MED ORDER — AMLODIPINE BESYLATE 5 MG PO TABS
2.5000 mg | ORAL_TABLET | Freq: Every day | ORAL | Status: DC
  Administered 2018-02-24: 2.5 mg via ORAL
  Filled 2018-02-23 (×2): qty 1

## 2018-02-23 MED ORDER — LEVOTHYROXINE SODIUM 50 MCG PO TABS
75.0000 ug | ORAL_TABLET | Freq: Every day | ORAL | Status: DC
  Administered 2018-02-23: 75 ug via ORAL
  Filled 2018-02-23 (×2): qty 1

## 2018-02-23 MED ORDER — SODIUM CHLORIDE 0.45 % IV SOLN
INTRAVENOUS | Status: DC
  Administered 2018-02-23: 12:00:00 via INTRAVENOUS

## 2018-02-23 MED ORDER — PHENYLEPHRINE 40 MCG/ML (10ML) SYRINGE FOR IV PUSH (FOR BLOOD PRESSURE SUPPORT)
PREFILLED_SYRINGE | INTRAVENOUS | Status: AC
  Filled 2018-02-23: qty 10

## 2018-02-23 MED ORDER — CEPHALEXIN 500 MG PO CAPS
500.0000 mg | ORAL_CAPSULE | Freq: Three times a day (TID) | ORAL | Status: DC
  Administered 2018-02-23 – 2018-02-24 (×3): 500 mg via ORAL
  Filled 2018-02-23 (×3): qty 1

## 2018-02-23 MED ORDER — MEPERIDINE HCL 50 MG/ML IJ SOLN: 6.2500 mg | INTRAMUSCULAR | Status: DC | PRN

## 2018-02-23 MED ORDER — STERILE WATER FOR IRRIGATION IR SOLN
Status: DC | PRN
  Administered 2018-02-23: 500 mL

## 2018-02-23 MED ORDER — EPHEDRINE SULFATE-NACL 50-0.9 MG/10ML-% IV SOSY
PREFILLED_SYRINGE | INTRAVENOUS | Status: DC | PRN
  Administered 2018-02-23 (×2): 10 mg via INTRAVENOUS
  Administered 2018-02-23: 15 mg via INTRAVENOUS

## 2018-02-23 MED ORDER — FENTANYL CITRATE (PF) 100 MCG/2ML IJ SOLN
INTRAMUSCULAR | Status: DC | PRN
  Administered 2018-02-23 (×5): 50 ug via INTRAVENOUS

## 2018-02-23 MED ORDER — BUPIVACAINE HCL 0.5 % IJ SOLN
INTRAMUSCULAR | Status: DC | PRN
  Administered 2018-02-23: 17 mL

## 2018-02-23 MED ORDER — PROPOFOL 10 MG/ML IV BOLUS
INTRAVENOUS | Status: DC | PRN
  Administered 2018-02-23: 150 mg via INTRAVENOUS

## 2018-02-23 MED ORDER — FENTANYL CITRATE (PF) 250 MCG/5ML IJ SOLN
INTRAMUSCULAR | Status: AC
  Filled 2018-02-23: qty 5

## 2018-02-23 MED ORDER — SODIUM CHLORIDE 0.9 % IV SOLN
INTRAVENOUS | Status: AC
  Filled 2018-02-23: qty 500000

## 2018-02-23 MED ORDER — DEXAMETHASONE SODIUM PHOSPHATE 10 MG/ML IJ SOLN
INTRAMUSCULAR | Status: AC
  Filled 2018-02-23: qty 1

## 2018-02-23 MED ORDER — LIDOCAINE 2% (20 MG/ML) 5 ML SYRINGE
INTRAMUSCULAR | Status: DC | PRN
  Administered 2018-02-23: 60 mg via INTRAVENOUS

## 2018-02-23 MED ORDER — SODIUM CHLORIDE 0.9 % IV SOLN
INTRAVENOUS | Status: DC | PRN
  Administered 2018-02-23: 500 mL

## 2018-02-23 MED ORDER — ONDANSETRON HCL 4 MG/2ML IJ SOLN
INTRAMUSCULAR | Status: AC
  Filled 2018-02-23: qty 2

## 2018-02-23 MED ORDER — LIDOCAINE 2% (20 MG/ML) 5 ML SYRINGE
INTRAMUSCULAR | Status: AC
  Filled 2018-02-23: qty 5

## 2018-02-23 MED ORDER — CEFAZOLIN SODIUM-DEXTROSE 2-4 GM/100ML-% IV SOLN
2.0000 g | INTRAVENOUS | Status: AC
  Administered 2018-02-23: 2 g via INTRAVENOUS
  Filled 2018-02-23: qty 100

## 2018-02-23 MED ORDER — CARVEDILOL 3.125 MG PO TABS
3.1250 mg | ORAL_TABLET | Freq: Two times a day (BID) | ORAL | Status: DC
  Administered 2018-02-23 – 2018-02-24 (×2): 3.125 mg via ORAL
  Filled 2018-02-23 (×3): qty 1

## 2018-02-23 SURGICAL SUPPLY — 48 items
ADH SKN CLS APL DERMABOND .7 (GAUZE/BANDAGES/DRESSINGS) ×3
BAG DECANTER FOR FLEXI CONT (MISCELLANEOUS) ×3 IMPLANT
BAG URINE DRAINAGE (UROLOGICAL SUPPLIES) ×3 IMPLANT
BLADE SURG 15 STRL LF DISP TIS (BLADE) ×2 IMPLANT
BLADE SURG 15 STRL SS (BLADE) ×6
BNDG GAUZE ELAST 4 BULKY (GAUZE/BANDAGES/DRESSINGS) ×1 IMPLANT
BRIEF STRETCH FOR OB PAD LRG (UNDERPADS AND DIAPERS) ×3 IMPLANT
CATH FOLEY 2WAY SLVR  5CC 14FR (CATHETERS)
CATH FOLEY 2WAY SLVR  5CC 16FR (CATHETERS) ×4
CATH FOLEY 2WAY SLVR 5CC 14FR (CATHETERS) ×1 IMPLANT
CATH FOLEY 2WAY SLVR 5CC 16FR (CATHETERS) IMPLANT
COVER MAYO STAND STRL (DRAPES) ×3 IMPLANT
DECANTER SPIKE VIAL GLASS SM (MISCELLANEOUS) ×3 IMPLANT
DERMABOND ADVANCED (GAUZE/BANDAGES/DRESSINGS) ×6
DERMABOND ADVANCED .7 DNX12 (GAUZE/BANDAGES/DRESSINGS) ×2 IMPLANT
DISSECTOR ROUND CHERRY 3/8 STR (MISCELLANEOUS) ×1 IMPLANT
DRAPE SHEET LG 3/4 BI-LAMINATE (DRAPES) IMPLANT
DRSG TELFA 3X8 NADH (GAUZE/BANDAGES/DRESSINGS) IMPLANT
ELECT PENCIL ROCKER SW 15FT (MISCELLANEOUS) ×3 IMPLANT
ELECT REM PT RETURN 15FT ADLT (MISCELLANEOUS) ×3 IMPLANT
GAUZE 4X4 16PLY RFD (DISPOSABLE) ×2 IMPLANT
GLOVE BIO SURGEON STRL SZ 6.5 (GLOVE) ×2 IMPLANT
GLOVE BIO SURGEONS STRL SZ 6.5 (GLOVE) ×1
GLOVE BIOGEL M STRL SZ7.5 (GLOVE) ×3 IMPLANT
GOWN STRL REUS W/TWL XL LVL3 (GOWN DISPOSABLE) ×3 IMPLANT
KIT BASIN OR (CUSTOM PROCEDURE TRAY) ×3 IMPLANT
LOOP VESSEL MAXI BLUE (MISCELLANEOUS) ×3 IMPLANT
PACK CYSTO (CUSTOM PROCEDURE TRAY) ×3 IMPLANT
PAD DRESSING TELFA 3X8 NADH (GAUZE/BANDAGES/DRESSINGS) ×1 IMPLANT
PLUG CATH AND CAP STER (CATHETERS) ×3 IMPLANT
POSITIONER SURGICAL ARM (MISCELLANEOUS) ×3 IMPLANT
SHEET LAVH (DRAPES) ×1 IMPLANT
SUT MNCRL AB 4-0 PS2 18 (SUTURE) ×6 IMPLANT
SUT SILK 0 FSL (SUTURE) ×1 IMPLANT
SUT VIC AB 0 CT1 27 (SUTURE)
SUT VIC AB 0 CT1 27XBRD ANTBC (SUTURE) ×1 IMPLANT
SUT VIC AB 2-0 SH 27 (SUTURE) ×6
SUT VIC AB 2-0 SH 27X BRD (SUTURE) IMPLANT
SUT VIC AB 3-0 SH 27 (SUTURE) ×9
SUT VIC AB 3-0 SH 27X BRD (SUTURE) ×4 IMPLANT
SYR 10ML LL (SYRINGE) ×6 IMPLANT
SYR 20CC LL (SYRINGE) ×3 IMPLANT
SYR 30ML LL (SYRINGE) ×1 IMPLANT
TOWEL OR 17X26 10 PK STRL BLUE (TOWEL DISPOSABLE) ×4 IMPLANT
TOWEL OR NON WOVEN STRL DISP B (DISPOSABLE) ×3 IMPLANT
TUBING CONNECTING 10 (TUBING) ×1 IMPLANT
TUBING CONNECTING 10' (TUBING)
YANKAUER SUCT BULB TIP 10FT TU (MISCELLANEOUS) ×3 IMPLANT

## 2018-02-23 NOTE — Op Note (Signed)
Preoperative diagnosis: Erosion of artificial urinary sphincter  Postoperative diagnosis: Same  Procedure: Explant of artificial urinary sphincter  Surgeon: Lillette Boxer. Antawn Sison, [name]   Anesthesia: Gen.   Complications: None  Specimen(s): None  Drain(s): None  Indications: 84 year old male who had a radical prostatectomy in 2000.  The patient had postoperative stress urinary incontinence and had an artificial urinary sphincter placed by Dr. Hessie Diener in 2007.  He recently had an explant of his inflatable penile prosthesis, and additionally was found to have a erosion of his artificial urinary sphincter.  He presents at this time for explant of his sphincter.  I discussed the procedure as well as the fact that the patient will have 3 incisions for explantation.  The patient and his wife are well instructed about risks and complications of the procedure have decided to proceed.   Technique and findings: The patient was properly identified in the holding area.  He received preoperative IV antibiotics and was taken to the operating room where general anesthetic was administered.  He was placed in the dorsolithotomy position.  His ostomy was covered with a surgical barrier.  The lower abdomen and perineum were then prepped and draped.  Proper timeout was performed.  Initially, a 3 cm incision was made in his left inguinal region in an oblique fashion over top of the tubing entry into the patient's retroperitoneum.  This was carried down through muscular layers with electrocautery.  The tubing was followed down using the cutting current on the Bovie.  With this, and dissection with a Vanderbilt clamp, the reservoir was identified and removed.  The pump in the scrotum was then identified, and a 1/2 cm incision made over top of the dependent part of the pump.  The pump was easily removed from the scrotum.  The tubing was cut and the pump disposed of.  I then made a 5 cm incision in the midline  anterior perineum.  This was carried down to the palpable artificial urinary sphincter cuff which was identified.  The cuff was then carefully dissected laterally using sharp dissection.  I then found the tab which the tubing protruded through to secure the cuff.  This was incised, and the cuff was removed and passed from the table.  There was a small area of urethra which was seen, as the Foley catheter had been previously placed in the urethra/bladder.  Careful reapproximation of the urothelium was performed using 4-0 Vicryl.  Several layers of closure over top of this by running Vicryl's was performed.  Prior to closure, vigorous irrigation with antibiotic irrigant was performed.  All tubing had been removed during the dissections.  At this point, the inguinal incision was closed, first reapproximating the muscular layer with a running 2-0 Vicryl.  Marcaine was placed within this closure, as well in the clinic the left, as well as the perineal closure using half percent Marcaine.  The Scarpa's fascia and the inguinal incision was reapproximated using a running 3-0 Vicryl.  The inguinal incision was closed using 4-0 Monocryl placed in a running subcuticular fashion.  Perineal incision was closed using the same subcuticular suture of 4-0 Monocryl.  The scrotal incision was closed with a running simple Monocryl suture.  Dermabond was placed on all incisions.  The Foley catheter was placed to dependent drainage.  Fluffs and mesh underpants were then placed.  At this point, the procedure was terminated.  The patient was awakened and taken to the PACU in stable condition, having tolerated the procedure  well.  Estimated blood loss was approximately 50 cc.  Sponge needle and instrument counts were correct x2.

## 2018-02-23 NOTE — H&P (Signed)
H&P  Chief Complaint: Eroded AUS  History of Present Illness: 84 year old male s/p explant of eroded 2 pc IPP on 12/15/2017. He initially presented with penile/scrotal pain/swelling and cystoscopy subsequently performed revealing erosion of leftt penile implant into the penile urethra. He subsequentlyhad short term catheter drainage. Later found to have eroded AUS as well.  He has been on abx suppression in advance of explant, for which he presents today.  Past Medical History:  Diagnosis Date  . Anemia   . Aortic stenosis    a. mild-mod by TEE 11/2015.  . Arthritis    left knee   . CAD (coronary artery disease)    a. RCA thrombectomy 2014. b. CABGx3 in 11/2015 (EF 45-50% in 11/2015).  . Chronic renal insufficiency, stage III (moderate) (HCC)    only has 0ne kidney  . Dyspnea    WITH EXERTION  . Esophageal stricture YRS AGO  . Hypertension   . Hypothyroidism   . Kidney stone 1969  . MI (myocardial infarction) ([place]) 2017  . Mitral regurgitation    a. mild by TEE 11/2015.  Marland Kitchen Myocardial infarction ([place]) 03/12/13   Inferior MI.  LAD mid 80% stenosis, circ mild plaque, RCA 100% with thrombectomy PTCA.  EF 65%  . Osteoporosis   . PC (prostate cancer) ([place]) 1987  . RBBB   . Rectum cancer ([place]) 1986  . Renal cell cancer ([place]) 2005    Past Surgical History:  Procedure Laterality Date  . CARDIAC CATHETERIZATION N/A 12/06/2015   Procedure: Left Heart Cath and Coronary Angiography;  Surgeon: Burnell Blanks, MD;  Location: [facility];  Service: Cardiovascular;  Laterality: N/A;  . COLONOSCOPY  01/08/2012   Procedure: COLONOSCOPY;  Surgeon: Rogene Houston, MD;  [facility];  Service: Endoscopy;  Laterality: N/A;  1030  . COLONOSCOPY N/A 07/20/2015   Procedure: COLONOSCOPY;  Surgeon: Rogene Houston, MD;  [facility];  Service: Endoscopy;  Laterality: N/A;  930 - moved to 9/29 @ 2:00  . COLOSTOMY Left 1986   colon cancer  . CORONARY ARTERY BYPASS GRAFT  N/A 12/08/2015   Procedure: CORONARY ARTERY BYPASS GRAFTING (CABG);  Surgeon: Melrose Nakayama, MD;  Location: [facility];  Service: Open Heart Surgery;  Laterality: N/A;  Times 3 using left internal mammary artery and endoscopycally harvested bilateral saphenous vein.  Marland Kitchen HERNIA REPAIR     2007 RIGHT INGUINAL HERNIA  . KIDNEY SURGERY Right 2004  . LEFT HEART CATHETERIZATION WITH CORONARY ANGIOGRAM N/A 03/12/2013   Procedure: STEMI;  Surgeon: Burnell Blanks, MD;  Location: [facility] CATH LAB;  Service: Cardiovascular;  Laterality: N/A;  . PERCUTANEOUS CORONARY INTERVENTION-BALLOON ONLY    . PROSTATECTOMY    . [place]  2006  . REMOVAL OF PENILE PROSTHESIS N/A 12/15/2017   Procedure: REMOVAL OF PENILE PROSTHESIS;  Surgeon: Franchot Gallo, MD;  [facility];  Service: Urology;  Laterality: N/A;  . renal sphincter appliance  2007  . TEE WITHOUT CARDIOVERSION N/A 12/08/2015   Procedure: TRANSESOPHAGEAL ECHOCARDIOGRAM (TEE);  Surgeon: Melrose Nakayama, MD;  Location: [place];  Service: Open Heart Surgery;  Laterality: N/A;    Home Medications:  Allergies as of 02/23/2018      Reactions   Lipitor [atorvastatin] Other (See Comments)   Muscle aches      Medication List    Notice   Cannot display discharge medications because the patient has not yet been admitted.     Allergies:  Allergies  Allergen Reactions  . Lipitor [Atorvastatin] Other (See Comments)    Muscle aches    Family History  Problem Relation Age of Onset  . Stroke Mother 53  . AAA (abdominal aortic aneurysm) Father 59       died from rupture at 85 yo  . Arthritis Father   . Parkinson's disease Sister   . Cancer Son        prostate cancer  . Hypertension Son     Social History:  reports that he quit smoking about 49 years ago. His smoking use included cigarettes. He started smoking about 71 years ago. He has a 5.00 pack-year smoking history. He has never used smokeless tobacco. He  reports that he drinks about 0.5 oz of alcohol per week. He reports that he does not use drugs.  ROS: A complete review of systems was performed.  All systems are negative except for pertinent findings as noted.  Physical Exam:  Vital signs in last 24 hours:   Constitutional:  Alert and oriented, No acute distress Cardiovascular: Regular rate and rhythm, No JVD Respiratory: Normal respiratory effort, Lungs clear bilaterally GI: Abdomen is soft, nontender, nondistended, no abdominal masses Genitourinary: No CVAT. Normal male phallus, testes are descended bilaterally and non-tender and without masses, scrotum is normal in appearance without lesions or masses, perineum is normal on inspection. Lymphatic: No lymphadenopathy Neurologic: Grossly intact, no focal deficits Psychiatric: Normal mood and affect  Laboratory Data:  Recent Labs    02/20/18 1516  WBC 7.2  HGB 12.0*  HCT 38.5*  PLT 254    Recent Labs    02/20/18 1516  NA 141  K 5.3*  CL 108  GLUCOSE 104*  BUN 31*  CALCIUM 9.6  CREATININE 1.50*     No results found for this or any previous visit (from the past 24 hour(s)). No results found for this or any previous visit (from the past 240 hour(s)).  Renal Function: Recent Labs    02/20/18 1516  CREATININE 1.50*   Estimated Creatinine Clearance: 26.8 mL/min (A) (by C-G formula based on SCr of 1.5 mg/dL (H)).  Radiologic Imaging: No results found.  Impression/Assessment:  Eroded AUS  Plan:  Explant

## 2018-02-23 NOTE — Anesthesia Procedure Notes (Signed)
Procedure Name: LMA Insertion Date/Time: 02/23/2018 8:56 AM Performed by: Talbot Grumbling, CRNA Pre-anesthesia Checklist: Patient identified, Emergency Drugs available, Suction available and Patient being monitored Patient Re-evaluated:Patient Re-evaluated prior to induction Oxygen Delivery Method: Circle system utilized Preoxygenation: Pre-oxygenation with 100% oxygen Induction Type: IV induction Ventilation: Mask ventilation without difficulty LMA: LMA inserted LMA Size: 4.0 Number of attempts: 1 Placement Confirmation: positive ETCO2 and breath sounds checked- equal and bilateral Tube secured with: Tape Comments: Lips dry. Small amount blood wiped from upper lip.

## 2018-02-23 NOTE — Anesthesia Postprocedure Evaluation (Signed)
Anesthesia Post Note  Patient: Frederick Davis [name]  Procedure(s) Performed: [name] OF ERODED SPHINCTER (N/A )     Patient location during evaluation: PACU Anesthesia Type: General Level of consciousness: awake and alert Pain management: pain level controlled Vital Signs Assessment: post-procedure vital signs reviewed and stable Respiratory status: spontaneous breathing, nonlabored ventilation, respiratory function stable and patient connected to nasal cannula oxygen Cardiovascular status: blood pressure returned to baseline and stable Postop Assessment: no apparent nausea or vomiting Anesthetic complications: no    Last Vitals:  Vitals:   02/23/18 1115 02/23/18 1135  BP: 131/78 (!) 141/91  Pulse: 65 63  Resp: 16 14  Temp: (!) 36.3 C 36.4 C  SpO2: 100% 100%    Last Pain:  Vitals:   02/23/18 1319  TempSrc:   PainSc: 0-No pain                 Prisca Gearing

## 2018-02-23 NOTE — Interval H&P Note (Signed)
History and Physical Interval Note:  02/23/2018 8:46 AM  Frederick Davis  has presented today for surgery, with the diagnosis of ERODED ARTIFICIAL SPHINCTER  The various methods of treatment have been discussed with the patient and family. After consideration of risks, benefits and other options for treatment, the patient has consented to  Procedure(s): EXPLANT OF ERODED SPHINCTER (N/A) as a surgical intervention .  The patient's history has been reviewed, patient examined, no change in status, stable for surgery.  I have reviewed the patient's chart and labs.  Questions were answered to the patient's satisfaction.     Lillette Boxer Crystallee Werden

## 2018-02-23 NOTE — Transfer of Care (Signed)
Immediate Anesthesia Transfer of Care Note  Patient: Frederick Davis [name]  Procedure(s) Performed: [name] OF ERODED SPHINCTER (N/A )  Patient Location: PACU  Anesthesia Type:General  Level of Consciousness: drowsy and patient cooperative  Airway & Oxygen Therapy: Patient Spontanous Breathing and Patient connected to face mask oxygen  Post-op Assessment: Report given to RN and Post -op Vital signs reviewed and stable  Post vital signs: Reviewed and stable  Last Vitals:  Vitals Value Taken Time  BP 130/80 02/23/2018 10:46 AM  Temp    Pulse 65 02/23/2018 10:47 AM  Resp 8 02/23/2018 10:47 AM  SpO2 100 % 02/23/2018 10:47 AM  Vitals shown include unvalidated device data.  Last Pain:  Vitals:   02/23/18 0743  TempSrc:   PainSc: 0-No pain      Patients Stated Pain Goal: 4 (02/72/53 6644)  Complications: No apparent anesthesia complications

## 2018-02-24 ENCOUNTER — Encounter (HOSPITAL_COMMUNITY): Payer: Self-pay | Admitting: Urology

## 2018-02-24 DIAGNOSIS — N393 Stress incontinence (female) (male): Secondary | ICD-10-CM | POA: Diagnosis not present

## 2018-02-24 DIAGNOSIS — I2510 Atherosclerotic heart disease of native coronary artery without angina pectoris: Secondary | ICD-10-CM | POA: Diagnosis not present

## 2018-02-24 DIAGNOSIS — D649 Anemia, unspecified: Secondary | ICD-10-CM | POA: Diagnosis not present

## 2018-02-24 DIAGNOSIS — I129 Hypertensive chronic kidney disease with stage 1 through stage 4 chronic kidney disease, or unspecified chronic kidney disease: Secondary | ICD-10-CM | POA: Diagnosis not present

## 2018-02-24 DIAGNOSIS — T83118A Breakdown (mechanical) of other urinary devices and implants, initial encounter: Secondary | ICD-10-CM | POA: Diagnosis not present

## 2018-02-24 DIAGNOSIS — N183 Chronic kidney disease, stage 3 (moderate): Secondary | ICD-10-CM | POA: Diagnosis not present

## 2018-02-24 MED ORDER — CEPHALEXIN 500 MG PO CAPS: 500.0000 mg | ORAL_CAPSULE | Freq: Three times a day (TID) | ORAL | 0 refills | Status: DC

## 2018-02-24 NOTE — Progress Notes (Signed)
Dictation #1 [id number]  [id number]     HPI The patient presents for followup after a acute inferior wall infarct in May of 2014. He had thrombectomy of a right coronary artery occlusion. He was initially acute respiratory failure and was cooled. However, he responded very well to therapy.   Last year he had chest pain.  He was sent for cardiac catheterization. This demonstrated severe LAD stenosis and RCA stenosis. He subsequently underwent a CABG 3 on February 2017.   He was last seen preop in Feb of this year prior to a urologic procedure.   He had a Holter in Feb without sustained arrhythmia but with frequent PVCs.   He is a limited because he had another redo urologic surgery and has pain related to this.  They are having a lot of structural problems with her house and have been under stress getting this repaired.  He is also had a bronchitis and flu.  However, he feels relatively well from a cardiac standpoint and is not describing new palpitations, presyncope or syncope.  He is not having any chest pressure, neck or arm discomfort.  He has had no weight gain or edema.   Allergies  Allergen Reactions  . Lipitor [Atorvastatin] Other (See Comments)    Muscle aches   Prior to Admission medications   Medication Sig Start Date End Date Taking? Authorizing Provider  acetaminophen (TYLENOL) 500 MG tablet Take 500 mg by mouth at bedtime.   Yes [provider]  amLODipine (NORVASC) 2.5 MG tablet Take 1 tablet (2.5 mg total) by mouth daily. 04/21/17  Yes Minus Breeding, MD  aspirin 81 MG tablet Take 1 tablet (81 mg total) by mouth daily. Patient taking differently: Take 81 mg by mouth every evening.  03/18/13  Yes Barrett, Evelene Croon, PA-C  carvedilol (COREG) 3.125 MG tablet TAKE  (1)  TABLET TWICE A DAY WITH MEALS (BREAKFAST AND SUPPER) 07/24/17  Yes Chipper Herb, MD  furosemide (LASIX) 20 MG tablet Take 1 tablet daily as needed. Patient taking differently: Take one tablet by mouth  once every other day 04/07/17  Yes Jaclyn Andy, Jeneen Rinks, MD  levothyroxine (SYNTHROID, LEVOTHROID) 75 MCG tablet Take 75 mcg by mouth daily before breakfast.   Yes [provider]  Multiple Vitamin (MULTIVITAMIN) capsule Take 1 capsule daily by mouth.   Yes [provider]  nitroGLYCERIN (NITROSTAT) 0.4 MG SL tablet PLACE 1 TABLET UNDER THE TONGUE AT ONSET OF CHEST PAIN EVERY 5 MINTUES UP TO 3 TIMES AS NEEDED 05/30/17  Yes Chipper Herb, MD    Past Medical History:  Diagnosis Date  . Anemia   . Aortic stenosis    a. mild-mod by TEE 11/2015.  . Arthritis    left knee   . CAD (coronary artery disease)    a. RCA thrombectomy 2014. b. CABGx3 in 11/2015 (EF 45-50% in 11/2015).  . Chronic renal insufficiency, stage III (moderate) (HCC)    only has 0ne kidney  . Dyspnea    WITH EXERTION  . Esophageal stricture YRS AGO  . Hypertension   . Hypothyroidism   . Kidney stone 1969  . MI (myocardial infarction) ([place]) 2017  . Mitral regurgitation    a. mild by TEE 11/2015.  Marland Kitchen Myocardial infarction ([place]) 03/12/13   Inferior MI.  LAD mid 80% stenosis, circ mild plaque, RCA 100% with thrombectomy PTCA.  EF 65%  . Osteoporosis   . PC (prostate cancer) ([place]) 1987  . RBBB   . Rectum cancer (  [place]) 1986  . Renal cell cancer ([place]) 2005    Past Surgical History:  Procedure Laterality Date  . CARDIAC CATHETERIZATION N/A 12/06/2015   Procedure: Left Heart Cath and Coronary Angiography;  Surgeon: Burnell Blanks, MD;  Location: [facility];  Service: Cardiovascular;  Laterality: N/A;  . COLONOSCOPY  01/08/2012   Procedure: COLONOSCOPY;  Surgeon: Rogene Houston, MD;  [facility];  Service: Endoscopy;  Laterality: N/A;  1030  . COLONOSCOPY N/A 07/20/2015   Procedure: COLONOSCOPY;  Surgeon: Rogene Houston, MD;  [facility];  Service: Endoscopy;  Laterality: N/A;  930 - moved to 9/29 @ 2:00  . COLOSTOMY Left 1986   colon cancer  . CORONARY ARTERY BYPASS GRAFT  N/A 12/08/2015   Procedure: CORONARY ARTERY BYPASS GRAFTING (CABG);  Surgeon: Melrose Nakayama, MD;  Location: [facility];  Service: Open Heart Surgery;  Laterality: N/A;  Times 3 using left internal mammary artery and endoscopycally harvested bilateral saphenous vein.  Marland Kitchen HERNIA REPAIR     2007 RIGHT INGUINAL HERNIA  . KIDNEY SURGERY Right 2004  . LEFT HEART CATHETERIZATION WITH CORONARY ANGIOGRAM N/A 03/12/2013   Procedure: STEMI;  Surgeon: Burnell Blanks, MD;  Location: [facility] CATH LAB;  Service: Cardiovascular;  Laterality: N/A;  . PERCUTANEOUS CORONARY INTERVENTION-BALLOON ONLY    . PROSTATECTOMY    . [place]  2006  . REMOVAL OF PENILE PROSTHESIS N/A 12/15/2017   Procedure: REMOVAL OF PENILE PROSTHESIS;  Surgeon: Franchot Gallo, MD;  [facility];  Service: Urology;  Laterality: N/A;  . REMOVAL OF PENILE PROSTHESIS N/A 02/23/2018   Procedure: EXPLANT OF ERODED SPHINCTER;  Surgeon: Franchot Gallo, MD;  [facility];  Service: Urology;  Laterality: N/A;  . renal sphincter appliance  2007  . TEE WITHOUT CARDIOVERSION N/A 12/08/2015   Procedure: TRANSESOPHAGEAL ECHOCARDIOGRAM (TEE);  Surgeon: Melrose Nakayama, MD;  Location: [facility];  Service: Open Heart Surgery;  Laterality: N/A;    ROS:  As stated in the HPI and negative for all other systems.  PHYSICAL EXAM BP 112/74   Pulse 64   Ht 5' 4.5" (1.638 m)   Wt 158 lb (71.7 kg)   BMI 26.70 kg/m   GENERAL:  Well appearing NECK:  No jugular venous distention, waveform within normal limits, carotid upstroke brisk and symmetric, no bruits, no thyromegaly LUNGS:  Clear to auscultation bilaterally CHEST:  Well healed sternotomy scar. HEART:  PMI not displaced or sustained,S1 and S2 within normal limits, no S3, no S4, no clicks, no rubs, 2 out of 6 apical systolic murmur radiating slightly at the aortic outflow tract, no diastolic murmurs ABD:  Flat, positive bowel sounds normal in frequency in pitch, no  bruits, no rebound, no guarding, no midline pulsatile mass, no hepatomegaly, no splenomegaly EXT:  2 plus pulses throughout, no edema, no cyanosis no clubbing   EKG:  NA  Lab Results  Component Value Date   CHOL 138 12/12/2017   TRIG 72 12/12/2017   HDL 65 12/12/2017   LDLCALC 59 12/12/2017     ASSESSMENT AND PLAN  CAD/CABG:   The patient has no new sypmtoms.  No further cardiovascular testing is indicated.  We will continue with aggressive risk reduction and meds as listed.  HTN:  The blood pressure is at target. No change in medications is indicated. We will continue with therapeutic lifestyle changes (TLC).  DYSLIPIDEMIA:  He has been intolerant of 1 statin in the past.  His labs are excellent as above.  No change in therapy.  No change in therapy.   AORTIC STENOSIS:  This is moderate. I will follow this clinically.

## 2018-02-24 NOTE — Plan of Care (Signed)
  Problem: Education: Goal: Knowledge of General Education information will improve Outcome: Completed/Met   Problem: Health Behavior/Discharge Planning: Goal: Ability to manage health-related needs will improve Outcome: Completed/Met   Problem: Clinical Measurements: Goal: Ability to maintain clinical measurements within normal limits will improve Outcome: Completed/Met Goal: Will remain free from infection Outcome: Completed/Met Goal: Diagnostic test results will improve Outcome: Completed/Met Goal: Respiratory complications will improve Outcome: Completed/Met Goal: Cardiovascular complication will be avoided Outcome: Completed/Met   Problem: Activity: Goal: Risk for activity intolerance will decrease Outcome: Completed/Met   Problem: Nutrition: Goal: Adequate nutrition will be maintained Outcome: Completed/Met   Problem: Coping: Goal: Level of anxiety will decrease Outcome: Completed/Met   Problem: Elimination: Goal: Will not experience complications related to bowel motility Outcome: Completed/Met Goal: Will not experience complications related to urinary retention Outcome: Completed/Met   Problem: Pain Managment: Goal: General experience of comfort will improve Outcome: Completed/Met   Problem: Safety: Goal: Ability to remain free from injury will improve Outcome: Completed/Met   Problem: Skin Integrity: Goal: Risk for impaired skin integrity will decrease Outcome: Completed/Met   Problem: Clinical Measurements: Goal: Postoperative complications will be avoided or minimized Outcome: Completed/Met   Problem: Skin Integrity: Goal: Demonstration of wound healing without infection will improve Outcome: Completed/Met

## 2018-02-24 NOTE — Progress Notes (Signed)
D/C instructions including foley/leg bag care reviewed w/ pt and wife. Both verbalize understanding and all questions answered. Pt d/c in w/c in stable condition by NT to wife's car. Pt in possession of d/c packet and all personal belongings.

## 2018-02-24 NOTE — Discharge Instructions (Signed)
HOME CARE INSTRUCTIONS FOR SCROTAL PROCEDURES  Wound Care & Hygiene: You may apply an ice bag to the scrotum for the first 24 hours.  This may help decrease swelling and soreness.  You may have a dressing held in place by an athletic supporter.  You may remove the dressing in 24 hours and shower in 48 hours.  Continue to use the athletic supporter or tight briefs for at least a week. Activity: Rest today - not necessarily flat bed rest.  Just take it easy.  You should not do strenuous activities until your follow-up visit with your doctor.  You may resume light activity in 48 hours.  Return to Work:  Your doctor will advise you of this depending on the type of work you do  Diet: Drink liquids or eat a light diet this evening.  You may resume a regular diet tomorrow.  General Expectations: You may have a small amount of bleeding.  The scrotum may be swollen or bruised for about a week.  Call your Doctor if these occur:  -persistent or heavy bleeding  -temperature of 101 degrees or more  -severe pain, not relieved by your pain medication  Return to Doctor's Office:  Call to set up and appointment.  Patient Signature:  __________________________________________________  Nurse's Signature:  __________________________________________________  

## 2018-02-25 ENCOUNTER — Encounter: Payer: Self-pay | Admitting: Cardiology

## 2018-02-25 ENCOUNTER — Ambulatory Visit (INDEPENDENT_AMBULATORY_CARE_PROVIDER_SITE_OTHER): Payer: Medicare Other | Admitting: Cardiology

## 2018-02-25 VITALS — BP 112/74 | HR 64 | Ht 64.5 in | Wt 158.0 lb

## 2018-02-25 DIAGNOSIS — E785 Hyperlipidemia, unspecified: Secondary | ICD-10-CM

## 2018-02-25 DIAGNOSIS — I2510 Atherosclerotic heart disease of native coronary artery without angina pectoris: Secondary | ICD-10-CM | POA: Diagnosis not present

## 2018-02-25 DIAGNOSIS — I350 Nonrheumatic aortic (valve) stenosis: Secondary | ICD-10-CM | POA: Diagnosis not present

## 2018-02-25 DIAGNOSIS — I10 Essential (primary) hypertension: Secondary | ICD-10-CM | POA: Diagnosis not present

## 2018-02-25 NOTE — Patient Instructions (Signed)
Medication Instructions:  The current medical regimen is effective;  continue present plan and medications.  Follow-Up: Follow up in 6 months with Dr. Hochrein.  You will receive a letter in the mail 2 months before you are due.  Please call us when you receive this letter to schedule your follow up appointment.  If you need a refill on your cardiac medications before your next appointment, please call your pharmacy.  Thank you for choosing [facility]!!       

## 2018-03-02 ENCOUNTER — Other Ambulatory Visit: Payer: Self-pay | Admitting: Urology

## 2018-03-02 DIAGNOSIS — T83191A Other mechanical complication of urinary sphincter implant, initial encounter: Secondary | ICD-10-CM

## 2018-03-10 NOTE — Discharge Summary (Signed)
Patient ID: Frederick Davis MRN: [id number] DOB/AGE: 1923-01-23 84 y.o.  Admit date: 02/23/2018 Discharge date:02/24/2018  Primary Care Physician:  Chipper Herb, MD  Discharge Diagnoses:  Cuff erosion of artificial urinary sphincter [facility]) Present on Admission: . Cuff erosion of artificial urinary sphincter (HCC)   Consults:  None   Discharge Medications: Allergies as of 02/24/2018      Reactions   Lipitor [atorvastatin] Other (See Comments)   Muscle aches      Medication List    STOP taking these medications   fluconazole 100 MG tablet Commonly known as:  DIFLUCAN     TAKE these medications   acetaminophen 500 MG tablet Commonly known as:  TYLENOL Take 500 mg by mouth at bedtime.   aspirin 81 MG tablet Take 1 tablet (81 mg total) by mouth daily. What changed:  when to take this   cephALEXin 500 MG capsule Commonly known as:  KEFLEX Take 1 capsule (500 mg total) by mouth 3 (three) times daily.   furosemide 20 MG tablet Commonly known as:  LASIX Take 1 tablet daily as needed. What changed:    how much to take  how to take this  when to take this   multivitamin capsule Take 1 capsule daily by mouth.   nitroGLYCERIN 0.4 MG SL tablet Commonly known as:  NITROSTAT PLACE 1 TABLET UNDER THE TONGUE AT ONSET OF CHEST PAIN EVERY 5 MINTUES UP TO 3 TIMES AS NEEDED What changed:  See the new instructions.        Significant Diagnostic Studies:  No results found.  Brief H and P: For complete details please refer to admission H and P, but in brief pt admitted for explant of eroded AUS  Hospital Course: No postop difficulty following his surgery and he was d/ced on POD1 Active Problems:   Cuff erosion of artificial urinary sphincter (HCC)   Day of Discharge BP 121/77   Pulse 79   Temp 99.4 F (37.4 C) (Oral)   Resp (!) 8   Ht 5\' 5"  (1.651 m)   Wt 70.8 kg (156 lb)   SpO2 99%   BMI 25.96 kg/m   No results found for this or any previous visit (from  the past 24 hour(s)).  Physical Exam: General: Alert and awake oriented x3 not in any acute distress. HEENT: anicteric sclera, pupils reactive to light and accommodation CVS: S1-S2 clear no murmur rubs or gallops Chest: clear to auscultation bilaterally, no wheezing rales or rhonchi Abdomen: soft nontender, nondistended, normal bowel sounds, no organomegaly Extremities: no cyanosis, clubbing or edema noted bilaterally Neuro: Cranial nerves II-XII intact, no focal neurological deficits  Disposition:  Home  Diet:  Regular  Activity:  Advance as tolerated   Disposition and Follow-up:    F/U arranged  TESTS THAT NEED FOLLOW-UP  N/A  DISCHARGE FOLLOW-UP Follow-up Information    Franchot Gallo, MD Follow up.   Specialty:  Urology Why:  5/13 Contact information: [place] [address] [phone]           Time spent on Discharge:  10 mins  Signed: Lillette Boxer Merlen Gurry 03/10/2018, 12:17 PM

## 2018-03-18 ENCOUNTER — Ambulatory Visit (HOSPITAL_COMMUNITY)
Admission: RE | Admit: 2018-03-18 | Discharge: 2018-03-18 | Disposition: A | Discharge status: Home / Self Care | Payer: Medicare Other | Source: Ambulatory Visit | Attending: Urology | Admitting: Urology

## 2018-03-18 DIAGNOSIS — Y838 Other surgical procedures as the cause of abnormal reaction of the patient, or of later complication, without mention of misadventure at the time of the procedure: Secondary | ICD-10-CM | POA: Diagnosis not present

## 2018-03-18 DIAGNOSIS — T83191A Other mechanical complication of urinary sphincter implant, initial encounter: Secondary | ICD-10-CM | POA: Insufficient documentation

## 2018-03-31 DIAGNOSIS — R351 Nocturia: Secondary | ICD-10-CM | POA: Diagnosis not present

## 2018-04-14 ENCOUNTER — Other Ambulatory Visit: Payer: Self-pay | Admitting: Nurse Practitioner

## 2018-04-16 ENCOUNTER — Other Ambulatory Visit: Payer: Medicare Other

## 2018-04-16 DIAGNOSIS — C61 Malignant neoplasm of prostate: Secondary | ICD-10-CM | POA: Diagnosis not present

## 2018-04-16 DIAGNOSIS — I2510 Atherosclerotic heart disease of native coronary artery without angina pectoris: Secondary | ICD-10-CM | POA: Diagnosis not present

## 2018-04-16 DIAGNOSIS — E7800 Pure hypercholesterolemia, unspecified: Secondary | ICD-10-CM

## 2018-04-16 DIAGNOSIS — E559 Vitamin D deficiency, unspecified: Secondary | ICD-10-CM

## 2018-04-16 DIAGNOSIS — I10 Essential (primary) hypertension: Secondary | ICD-10-CM | POA: Diagnosis not present

## 2018-04-16 DIAGNOSIS — N183 Chronic kidney disease, stage 3 unspecified: Secondary | ICD-10-CM

## 2018-04-17 DIAGNOSIS — T83490A Other mechanical complication of penile (implanted) prosthesis, initial encounter: Secondary | ICD-10-CM | POA: Diagnosis not present

## 2018-04-17 LAB — CBC WITH DIFFERENTIAL/PLATELET
BASOS: 1 %
Basophils Absolute: 0.1 10*3/uL (ref 0.0–0.2)
EOS (ABSOLUTE): 0.4 10*3/uL (ref 0.0–0.4)
Eos: 5 %
Hematocrit: 38 % (ref 37.5–51.0)
Hemoglobin: 12.2 g/dL — ABNORMAL LOW (ref 13.0–17.7)
IMMATURE GRANS (ABS): 0 10*3/uL (ref 0.0–0.1)
Immature Granulocytes: 0 %
LYMPHS ABS: 1.7 10*3/uL (ref 0.7–3.1)
LYMPHS: 24 %
MCH: 27.2 pg (ref 26.6–33.0)
MCHC: 32.1 g/dL (ref 31.5–35.7)
MCV: 85 fL (ref 79–97)
MONOCYTES: 8 %
Monocytes Absolute: 0.6 10*3/uL (ref 0.1–0.9)
NEUTROS ABS: 4.6 10*3/uL (ref 1.4–7.0)
Neutrophils: 62 %
Platelets: 280 10*3/uL (ref 150–450)
RBC: 4.49 x10E6/uL (ref 4.14–5.80)
RDW: 14.8 % (ref 12.3–15.4)
WBC: 7.4 10*3/uL (ref 3.4–10.8)

## 2018-04-17 LAB — BMP8+EGFR
BUN / CREAT RATIO: 21 (ref 10–24)
BUN: 30 mg/dL (ref 10–36)
CALCIUM: 10.1 mg/dL (ref 8.6–10.2)
CHLORIDE: 106 mmol/L (ref 96–106)
CO2: 20 mmol/L (ref 20–29)
Creatinine, Ser: 1.41 mg/dL — ABNORMAL HIGH (ref 0.76–1.27)
GFR, EST AFRICAN AMERICAN: 49 mL/min/{1.73_m2} — AB (ref >59)
GFR, EST NON AFRICAN AMERICAN: 43 mL/min/{1.73_m2} — AB (ref >59)
Glucose: 86 mg/dL (ref 65–99)
POTASSIUM: 5.2 mmol/L (ref 3.5–5.2)
Sodium: 143 mmol/L (ref 134–144)

## 2018-04-17 LAB — HEPATIC FUNCTION PANEL
ALBUMIN: 4.4 g/dL (ref 3.2–4.6)
ALT: 10 IU/L (ref 0–44)
AST: 19 IU/L (ref 0–40)
Alkaline Phosphatase: 112 IU/L (ref 39–117)
Bilirubin Total: 0.4 mg/dL (ref 0.0–1.2)
Bilirubin, Direct: 0.13 mg/dL (ref 0.00–0.40)
TOTAL PROTEIN: 6.5 g/dL (ref 6.0–8.5)

## 2018-04-17 LAB — LIPID PANEL
CHOL/HDL RATIO: 2.4 ratio (ref 0.0–5.0)
Cholesterol, Total: 172 mg/dL (ref 100–199)
HDL: 72 mg/dL (ref >39)
LDL CALC: 86 mg/dL (ref 0–99)
Triglycerides: 69 mg/dL (ref 0–149)
VLDL CHOLESTEROL CAL: 14 mg/dL (ref 5–40)

## 2018-04-17 LAB — VITAMIN D 25 HYDROXY (VIT D DEFICIENCY, FRACTURES): VIT D 25 HYDROXY: 34.8 ng/mL (ref 30.0–100.0)

## 2018-04-20 ENCOUNTER — Ambulatory Visit (INDEPENDENT_AMBULATORY_CARE_PROVIDER_SITE_OTHER): Payer: Medicare Other | Admitting: Family Medicine

## 2018-04-20 ENCOUNTER — Encounter: Payer: Self-pay | Admitting: Family Medicine

## 2018-04-20 VITALS — BP 109/72 | HR 54 | Temp 97.1°F | Ht 64.5 in | Wt 165.0 lb

## 2018-04-20 DIAGNOSIS — I10 Essential (primary) hypertension: Secondary | ICD-10-CM

## 2018-04-20 DIAGNOSIS — E7800 Pure hypercholesterolemia, unspecified: Secondary | ICD-10-CM | POA: Diagnosis not present

## 2018-04-20 DIAGNOSIS — C641 Malignant neoplasm of right kidney, except renal pelvis: Secondary | ICD-10-CM | POA: Diagnosis not present

## 2018-04-20 DIAGNOSIS — I2510 Atherosclerotic heart disease of native coronary artery without angina pectoris: Secondary | ICD-10-CM

## 2018-04-20 DIAGNOSIS — C61 Malignant neoplasm of prostate: Secondary | ICD-10-CM | POA: Diagnosis not present

## 2018-04-20 DIAGNOSIS — N183 Chronic kidney disease, stage 3 unspecified: Secondary | ICD-10-CM

## 2018-04-20 DIAGNOSIS — E559 Vitamin D deficiency, unspecified: Secondary | ICD-10-CM

## 2018-04-20 NOTE — Progress Notes (Signed)
Subjective:    Patient ID: Frederick Davis, male    DOB: [birth year], 84 y.o.   MRN: [id number]  HPI Pt here for follow up and management of chronic medical problems which includes hyperlipidemia and hypertension. He is taking medication regularly.  The patient is doing well today with no complaints.  He has had some issues with having to see the urologist more frequently because of a failed erectile implant and removal of this.  He also has had bypass surgery and is followed regularly by the cardiologist that comes to town.  All of his cholesterol numbers were good and his LDL C was 86 which is a little bit high but he is currently not taking any medicine for his cholesterol.  The blood sugar is good at 86.  His creatinine is 1.41 and slightly less than it was previously.  All of the electrolytes were good including potassium.  The CBC had a normal white blood cell count a slightly decreased but stable hemoglobin at 12.2 and an adequate platelet count.  His vitamin D level was 34.8 and stable and within normal limits.  All liver function tests were normal.  Patient continues to follow-up with the urologist.  He also sees the cardiologist.  He denies any chest pain.  He has had some shortness of breath especially when out walking but does not have any when he is using his exercise equipment.  He denies any trouble with his intestinal tract including swallowing heartburn indigestion nausea vomiting diarrhea blood in the stool or black tarry bowel movements.  He is having his ongoing issues with the clamp he has to use from the urologist and does have a return appointment with the urologist soon.  He will be given an FOBT to return.    Patient Active Problem List   Diagnosis Date Noted  . Cuff erosion of artificial urinary sphincter ([place]) 02/23/2018  . Malfunction of penile prosthesis ([place]) 12/15/2017  . Renal cell carcinoma, right ([place]) 08/11/2017  . Vitamin D deficiency 08/11/2017  . ASCVD  (arteriosclerotic cardiovascular disease) 08/11/2017  . Aortic stenosis 05/28/2017  . Mitral regurgitation 05/28/2017  . Chest pain 05/27/2017  . Coronary artery disease involving native coronary artery of native heart without angina pectoris 03/19/2017  . Dyslipidemia 03/19/2017  . S/P CABG x 3 12/08/2015  . Unstable angina ([place])   . Chest pain with moderate risk of acute coronary syndrome 12/05/2015  . Aortic stenosis, mild-2014 12/05/2015  . Anginal pain ([place]) 10/17/2015  . History of renal cell cancer-s/p nephrectomy 07/19/2013  . Hyperlipidemia 07/19/2013  . Hypotension arterial 03/18/2013  . Complete heart block ([place]) 03/18/2013  . Anemia   . CKD (chronic kidney disease) stage 3, GFR 30-59 ml/min (HCC) 03/12/2013  . Acute MI, inferior wall, initial episode of care ([place]) 03/12/2013  . Impotence 03/12/2013  . History of renal stone 03/12/2013  . Hiatal hernia 03/12/2013  . H/O lithotripsy 03/12/2013  . SCCA (squamous cell carcinoma) of skin 03/12/2013  . S/P prostatectomy, radical 03/12/2013  . Left renal mass 03/12/2013  . Arthritis 03/12/2013  . History of STEMI- May 2014 03/12/2013  . CAD S/P RCA thrombectomy May 2014, CABG 11/2015 03/12/2013  . Cardiogenic shock ([place]) 03/12/2013  . Acute respiratory failure ([place]) 03/12/2013  . Altered mental status 03/12/2013  . Essential hypertension, benign 01/14/2013  . Colostomy in place [facility]) 01/14/2013  . Prostate cancer ([place]) 01/14/2013  . Renal cell carcinoma, Right 01/14/2013  . Osteoporosis, unspecified 01/14/2013  Outpatient Encounter Medications as of 04/20/2018  Medication Sig  . acetaminophen (TYLENOL) 500 MG tablet Take 500 mg by mouth at bedtime.  Marland Kitchen amLODipine (NORVASC) 2.5 MG tablet Take 2.5 mg by mouth daily.  Marland Kitchen aspirin 81 MG tablet Take 1 tablet (81 mg total) by mouth daily. (Patient taking differently: Take 81 mg by mouth at bedtime. )  . carvedilol (COREG) 3.125 MG tablet Take 3.125 mg by mouth 2 (two) times daily  with a meal.  . furosemide (LASIX) 20 MG tablet Take 1 tablet daily as needed. (Patient taking differently: Take 20 mgs by mouth once daily as needed for swelling)  . levothyroxine (SYNTHROID, LEVOTHROID) 75 MCG tablet TAKE 1 TABLET DAILY  . Multiple Vitamin (MULTIVITAMIN) capsule Take 1 capsule daily by mouth.  . nitroGLYCERIN (NITROSTAT) 0.4 MG SL tablet PLACE 1 TABLET UNDER THE TONGUE AT ONSET OF CHEST PAIN EVERY 5 MINTUES UP TO 3 TIMES AS NEEDED (Patient taking differently: PLACE 1 TABLET UNDER THE TONGUE AT ONSET OF CHEST PAIN EVERY 5 MINTUES UP TO 3 TIMES AS NEEDED FOR CHEST PAIN)  . [DISCONTINUED] cephALEXin (KEFLEX) 500 MG capsule Take 1 capsule (500 mg total) by mouth 3 (three) times daily.  . [DISCONTINUED] levothyroxine (SYNTHROID, LEVOTHROID) 75 MCG tablet Take 75 mcg by mouth daily before breakfast.    Facility-Administered Encounter Medications as of 04/20/2018  Medication  . etomidate (AMIDATE) injection  . lidocaine (cardiac) 100 mg/34ml (XYLOCAINE) 20 MG/ML injection 2%  . succinylcholine (ANECTINE) injection      Review of Systems  Constitutional: Negative.   HENT: Negative.   Eyes: Negative.   Respiratory: Negative.   Cardiovascular: Negative.   Gastrointestinal: Negative.   Endocrine: Negative.   Genitourinary: Negative.   Musculoskeletal: Negative.   Skin: Negative.   Allergic/Immunologic: Negative.   Neurological: Negative.   Hematological: Negative.   Psychiatric/Behavioral: Negative.        Objective:   BP 109/72 (BP Location: Left Arm)   Pulse (!) 54   Temp (!) 97.1 F (36.2 C) (Oral)   Ht 5' 4.5" (1.638 m)   Wt 165 lb (74.8 kg)   BMI 27.88 kg/m  Ears eyes nose and throat were all normal.  There were no bruits in the neck.  There were no masses or thyroid enlargement.  The heart had a regular rate and rhythm at 72/min with a grade 3/6 systolic ejection murmur.  The lungs were clear anteriorly and posteriorly.  There is no axillary adenopathy.  The  abdomen was soft without liver or spleen enlargement and the colostomy seems to be draining well with no problems.  There is no inguinal adenopathy.  The lower extremities had 1+ pretibial edema but pedal pulses were found bilaterally.  The patient was alert and appeared to have normal behavior judgment and memory.  Reflexes were normal bilaterally.  The rectal exam was not done as he is being followed regularly by the urologist.       Assessment & Plan:  1. Vitamin D deficiency -Continue with current treatment as results were good  2. Pure hypercholesterolemia -Cholesterol numbers were slightly increased above the goal of 70 and the patient will try to do better with therapeutic lifestyle changes and drinking more water   3. Essential hypertension, benign -The blood pressure is good today and he will continue with current treatment   4. ASCVD (arteriosclerotic cardiovascular disease) -Do better with therapeutic lifestyle changes home -Follow-up with cardiology as planned  5. Prostate cancer [facility]) -Follow-up with urology  as planned  6. CKD (chronic kidney disease) stage 3, GFR 30-59 ml/min (HCC) -The creatinine was slightly lower than it was previously and he understands to continue to avoid all NSAIDs and drink plenty of water  7. Renal cell carcinoma, right ([place]) -Follow-up with specialist at [facility] as planned  Patient Instructions                       Medicare Annual Wellness Visit  [facility] and the medical providers at [facility] strive to bring you the best medical care.  In doing so we not only want to address your current medical conditions and concerns but also to detect new conditions early and prevent illness, disease and health-related problems.    Medicare offers a yearly Wellness Visit which allows our clinical staff to assess your need for preventative services including immunizations, lifestyle education,  counseling to decrease risk of preventable diseases and screening for fall risk and other medical concerns.    This visit is provided free of charge (no copay) for all Medicare recipients. The clinical pharmacists at [facility] have begun to conduct these Wellness Visits which will also include a thorough review of all your medications.    As you primary medical provider recommend that you make an appointment for your Annual Wellness Visit if you have not done so already this year.  You may set up this appointment before you leave today or you may call back ([phone]) and schedule an appointment.  Please make sure when you call that you mention that you are scheduling your Annual Wellness Visit with the clinical pharmacist so that the appointment may be made for the proper length of time.     Continue current medications. Continue good therapeutic lifestyle changes which include good diet and exercise. Fall precautions discussed with patient. If an FOBT was given today- please return it to our front desk. If you are over 40 years old - you may need Prevnar 34 or the adult Pneumonia vaccine.  **Flu shots are available--- please call and schedule a FLU-CLINIC appointment**  After your visit with [place] today you will receive a survey in the mail or online from [employer] regarding your care with [place]. Please take a moment to fill this out. Your feedback is very important to [place] as you can help [place] better understand your patient needs as well as improve your experience and satisfaction. WE CARE ABOUT YOU!!!   Continue to follow-up with cardiology and urology Drink more water Always be careful and do not put yourself at risk for falling.  Arrie Senate MD

## 2018-04-20 NOTE — Patient Instructions (Addendum)
Medicare Annual Wellness Visit  [facility] and the medical providers at [facility] strive to bring you the best medical care.  In doing so we not only want to address your current medical conditions and concerns but also to detect new conditions early and prevent illness, disease and health-related problems.    Medicare offers a yearly Wellness Visit which allows our clinical staff to assess your need for preventative services including immunizations, lifestyle education, counseling to decrease risk of preventable diseases and screening for fall risk and other medical concerns.    This visit is provided free of charge (no copay) for all Medicare recipients. The clinical pharmacists at [facility] have begun to conduct these Wellness Visits which will also include a thorough review of all your medications.    As you primary medical provider recommend that you make an appointment for your Annual Wellness Visit if you have not done so already this year.  You may set up this appointment before you leave today or you may call back ([phone]) and schedule an appointment.  Please make sure when you call that you mention that you are scheduling your Annual Wellness Visit with the clinical pharmacist so that the appointment may be made for the proper length of time.     Continue current medications. Continue good therapeutic lifestyle changes which include good diet and exercise. Fall precautions discussed with patient. If an FOBT was given today- please return it to our front desk. If you are over 13 years old - you may need Prevnar 63 or the adult Pneumonia vaccine.  **Flu shots are available--- please call and schedule a FLU-CLINIC appointment**  After your visit with [place] today you will receive a survey in the mail or online from [employer] regarding your care with [place]. Please take a moment to fill this out. Your feedback is very  important to [place] as you can help [place] better understand your patient needs as well as improve your experience and satisfaction. WE CARE ABOUT YOU!!!   Continue to follow-up with cardiology and urology Drink more water Always be careful and do not put yourself at risk for falling.

## 2018-04-27 ENCOUNTER — Other Ambulatory Visit: Payer: Self-pay | Admitting: Cardiology

## 2018-05-05 DIAGNOSIS — L570 Actinic keratosis: Secondary | ICD-10-CM | POA: Diagnosis not present

## 2018-05-05 DIAGNOSIS — D485 Neoplasm of uncertain behavior of skin: Secondary | ICD-10-CM | POA: Diagnosis not present

## 2018-05-05 DIAGNOSIS — Z85828 Personal history of other malignant neoplasm of skin: Secondary | ICD-10-CM | POA: Diagnosis not present

## 2018-05-05 DIAGNOSIS — L821 Other seborrheic keratosis: Secondary | ICD-10-CM | POA: Diagnosis not present

## 2018-05-05 DIAGNOSIS — D0439 Carcinoma in situ of skin of other parts of face: Secondary | ICD-10-CM | POA: Diagnosis not present

## 2018-05-05 DIAGNOSIS — C44329 Squamous cell carcinoma of skin of other parts of face: Secondary | ICD-10-CM | POA: Diagnosis not present

## 2018-05-20 DIAGNOSIS — T83490A Other mechanical complication of penile (implanted) prosthesis, initial encounter: Secondary | ICD-10-CM | POA: Diagnosis not present

## 2018-05-21 DIAGNOSIS — D0439 Carcinoma in situ of skin of other parts of face: Secondary | ICD-10-CM | POA: Diagnosis not present

## 2018-05-21 DIAGNOSIS — Z85828 Personal history of other malignant neoplasm of skin: Secondary | ICD-10-CM | POA: Diagnosis not present

## 2018-06-07 ENCOUNTER — Emergency Department (HOSPITAL_COMMUNITY)
Admission: EM | Admit: 2018-06-07 | Discharge: 2018-06-07 | Disposition: A | Discharge status: Home / Self Care | Payer: Medicare Other | Attending: Emergency Medicine | Admitting: Emergency Medicine

## 2018-06-07 ENCOUNTER — Other Ambulatory Visit: Payer: Self-pay

## 2018-06-07 ENCOUNTER — Encounter (HOSPITAL_COMMUNITY): Payer: Self-pay

## 2018-06-07 DIAGNOSIS — Z87891 Personal history of nicotine dependence: Secondary | ICD-10-CM | POA: Insufficient documentation

## 2018-06-07 DIAGNOSIS — Z79899 Other long term (current) drug therapy: Secondary | ICD-10-CM | POA: Diagnosis not present

## 2018-06-07 DIAGNOSIS — Z85048 Personal history of other malignant neoplasm of rectum, rectosigmoid junction, and anus: Secondary | ICD-10-CM | POA: Diagnosis not present

## 2018-06-07 DIAGNOSIS — Z8546 Personal history of malignant neoplasm of prostate: Secondary | ICD-10-CM | POA: Diagnosis not present

## 2018-06-07 DIAGNOSIS — R32 Unspecified urinary incontinence: Secondary | ICD-10-CM | POA: Insufficient documentation

## 2018-06-07 DIAGNOSIS — Z7982 Long term (current) use of aspirin: Secondary | ICD-10-CM | POA: Insufficient documentation

## 2018-06-07 DIAGNOSIS — N4889 Other specified disorders of penis: Secondary | ICD-10-CM | POA: Diagnosis not present

## 2018-06-07 DIAGNOSIS — I2510 Atherosclerotic heart disease of native coronary artery without angina pectoris: Secondary | ICD-10-CM | POA: Diagnosis not present

## 2018-06-07 DIAGNOSIS — N471 Phimosis: Secondary | ICD-10-CM | POA: Insufficient documentation

## 2018-06-07 DIAGNOSIS — I10 Essential (primary) hypertension: Secondary | ICD-10-CM | POA: Insufficient documentation

## 2018-06-07 DIAGNOSIS — R600 Localized edema: Secondary | ICD-10-CM | POA: Diagnosis present

## 2018-06-07 LAB — CBC
HEMATOCRIT: 38.5 % — AB (ref 39.0–52.0)
HEMOGLOBIN: 12.3 g/dL — AB (ref 13.0–17.0)
MCH: 27.8 pg (ref 26.0–34.0)
MCHC: 31.9 g/dL (ref 30.0–36.0)
MCV: 87.1 fL (ref 78.0–100.0)
Platelets: 235 10*3/uL (ref 150–400)
RBC: 4.42 MIL/uL (ref 4.22–5.81)
RDW: 14.1 % (ref 11.5–15.5)
WBC: 8.6 10*3/uL (ref 4.0–10.5)

## 2018-06-07 LAB — BASIC METABOLIC PANEL
ANION GAP: 8 (ref 5–15)
BUN: 31 mg/dL — ABNORMAL HIGH (ref 8–23)
CALCIUM: 9.7 mg/dL (ref 8.9–10.3)
CO2: 25 mmol/L (ref 22–32)
Chloride: 108 mmol/L (ref 98–111)
Creatinine, Ser: 1.46 mg/dL — ABNORMAL HIGH (ref 0.61–1.24)
GFR calc non Af Amer: 40 mL/min — ABNORMAL LOW (ref >60)
GFR, EST AFRICAN AMERICAN: 46 mL/min — AB (ref >60)
Glucose, Bld: 92 mg/dL (ref 70–99)
Potassium: 5.1 mmol/L (ref 3.5–5.1)
Sodium: 141 mmol/L (ref 135–145)

## 2018-06-07 NOTE — ED Notes (Signed)
ED Provider at bedside. 

## 2018-06-07 NOTE — Discharge Instructions (Addendum)
Follow-up with urologist as we discussed, use a pad undergarment for now, do not use the clamp until urologist indicates will be safe to do so again

## 2018-06-07 NOTE — ED Triage Notes (Signed)
He c/o penile swelling. He has hx of penile procedures. They phoned [facility], and urologist is to see him here.

## 2018-06-07 NOTE — ED Provider Notes (Signed)
[facility] DEPT Provider Note   CSN: [id number] Arrival date & time: 06/07/18  1028     History   Chief Complaint Chief Complaint  Patient presents with  . Groin Swelling    HPI ONEILL BAIS is a 84 y.o. male.  HPI Presents to the emergency room for evaluation of penile swelling.  Patient has a history of urinary incontinence and is status post explant of an eroded 2 pc IPP in Feb 2019.  Pt then developed erosion of penil implant into the penile urethra.  He had short term catheter drainage after that and subequently was found to have eroded AUS as well.  Patient had surgery on May 7 for cuff erosion of the artificial urinary sphincter.  Patient has been following up with urology.  Patient has been using an external clamp device for his urinary incontinence.  Patient noticed this morning that he had significant penile swelling distal to the clamp.  He is also unable to retract his foreskin patient called the urologist and was instructed to come to the emergency room to be seen by the urologist.  She denies any fevers or chills.  No vomiting or diarrhea. Past Medical History:  Diagnosis Date  . Anemia   . Aortic stenosis    a. mild-mod by TEE 11/2015.  . Arthritis    left knee   . CAD (coronary artery disease)    a. RCA thrombectomy 2014. b. CABGx3 in 11/2015 (EF 45-50% in 11/2015).  . Chronic renal insufficiency, stage III (moderate) (HCC)    only has 0ne kidney  . Dyspnea    WITH EXERTION  . Esophageal stricture YRS AGO  . Hypertension   . Hypothyroidism   . Kidney stone 1969  . MI (myocardial infarction) ([place]) 2017  . Mitral regurgitation    a. mild by TEE 11/2015.  Marland Kitchen Myocardial infarction ([place]) 03/12/13   Inferior MI.  LAD mid 80% stenosis, circ mild plaque, RCA 100% with thrombectomy PTCA.  EF 65%  . Osteoporosis   . PC (prostate cancer) ([place]) 1987  . RBBB   . Rectum cancer ([place]) 1986  . Renal cell cancer [facility]) 2005    Patient  Active Problem List   Diagnosis Date Noted  . Cuff erosion of artificial urinary sphincter ([place]) 02/23/2018  . Malfunction of penile prosthesis ([place]) 12/15/2017  . Renal cell carcinoma, right ([place]) 08/11/2017  . Vitamin D deficiency 08/11/2017  . ASCVD (arteriosclerotic cardiovascular disease) 08/11/2017  . Aortic stenosis 05/28/2017  . Mitral regurgitation 05/28/2017  . Chest pain 05/27/2017  . Coronary artery disease involving native coronary artery of native heart without angina pectoris 03/19/2017  . Dyslipidemia 03/19/2017  . S/P CABG x 3 12/08/2015  . Unstable angina ([place])   . Chest pain with moderate risk of acute coronary syndrome 12/05/2015  . Aortic stenosis, mild-2014 12/05/2015  . Anginal pain ([place]) 10/17/2015  . History of renal cell cancer-s/p nephrectomy 07/19/2013  . Hyperlipidemia 07/19/2013  . Hypotension arterial 03/18/2013  . Complete heart block ([place]) 03/18/2013  . Anemia   . CKD (chronic kidney disease) stage 3, GFR 30-59 ml/min (HCC) 03/12/2013  . Acute MI, inferior wall, initial episode of care ([place]) 03/12/2013  . Impotence 03/12/2013  . History of renal stone 03/12/2013  . Hiatal hernia 03/12/2013  . H/O lithotripsy 03/12/2013  . SCCA (squamous cell carcinoma) of skin 03/12/2013  . S/P prostatectomy, radical 03/12/2013  . Left renal mass 03/12/2013  . Arthritis 03/12/2013  . History  of STEMI- May 2014 03/12/2013  . CAD S/P RCA thrombectomy May 2014, CABG 11/2015 03/12/2013  . Cardiogenic shock ([place]) 03/12/2013  . Acute respiratory failure ([place]) 03/12/2013  . Altered mental status 03/12/2013  . Essential hypertension, benign 01/14/2013  . Colostomy in place [facility]) 01/14/2013  . Prostate cancer ([place]) 01/14/2013  . Renal cell carcinoma, Right 01/14/2013  . Osteoporosis, unspecified 01/14/2013    Past Surgical History:  Procedure Laterality Date  . CARDIAC CATHETERIZATION N/A 12/06/2015   Procedure: Left Heart Cath and Coronary Angiography;  Surgeon:  Burnell Blanks, MD;  Location: [facility] CV LAB;  Service: Cardiovascular;  Laterality: N/A;  . COLONOSCOPY  01/08/2012   Procedure: COLONOSCOPY;  Surgeon: Rogene Houston, MD;  [facility];  Service: Endoscopy;  Laterality: N/A;  1030  . COLONOSCOPY N/A 07/20/2015   Procedure: COLONOSCOPY;  Surgeon: Rogene Houston, MD;  [facility];  Service: Endoscopy;  Laterality: N/A;  930 - moved to 9/29 @ 2:00  . COLOSTOMY Left 1986   colon cancer  . CORONARY ARTERY BYPASS GRAFT N/A 12/08/2015   Procedure: CORONARY ARTERY BYPASS GRAFTING (CABG);  Surgeon: Melrose Nakayama, MD;  Location: [facility];  Service: Open Heart Surgery;  Laterality: N/A;  Times 3 using left internal mammary artery and endoscopycally harvested bilateral saphenous vein.  Marland Kitchen HERNIA REPAIR     2007 RIGHT INGUINAL HERNIA  . KIDNEY SURGERY Right 2004  . LEFT HEART CATHETERIZATION WITH CORONARY ANGIOGRAM N/A 03/12/2013   Procedure: STEMI;  Surgeon: Burnell Blanks, MD;  Location: [facility] CATH LAB;  Service: Cardiovascular;  Laterality: N/A;  . PERCUTANEOUS CORONARY INTERVENTION-BALLOON ONLY    . PROSTATECTOMY    . [place]  2006  . REMOVAL OF PENILE PROSTHESIS N/A 12/15/2017   Procedure: REMOVAL OF PENILE PROSTHESIS;  Surgeon: Franchot Gallo, MD;  Location: [facility] OR[facility];  Service: Urology;  Laterality: N/A;  . REMOVAL OF PENILE PROSTHESIS N/A 02/23/2018   Procedure: EXPLANT OF ERODED SPHINCTER;  Surgeon: Franchot Gallo, MD;  Location: [facility] OR[facility];  Service: Urology;  Laterality: N/A;  . renal sphincter appliance  2007  . TEE WITHOUT CARDIOVERSION N/A 12/08/2015   Procedure: TRANSESOPHAGEAL ECHOCARDIOGRAM (TEE);  Surgeon: Melrose Nakayama, MD;  Location: [facility];  Service: Open Heart Surgery;  Laterality: N/A;        Home Medications    Prior to Admission medications   Medication Sig Start Date End Date Taking? Authorizing Provider  acetaminophen (TYLENOL) 500 MG tablet Take 500  mg by mouth at bedtime.    [provider]  amLODipine (NORVASC) 2.5 MG tablet TAKE 1 TABLET DAILY 04/27/18   Minus Breeding, MD  aspirin 81 MG tablet Take 1 tablet (81 mg total) by mouth daily. Patient taking differently: Take 81 mg by mouth at bedtime.  03/18/13   Barrett, Evelene Croon, PA-C  carvedilol (COREG) 3.125 MG tablet Take 3.125 mg by mouth 2 (two) times daily with a meal.    [provider]  furosemide (LASIX) 20 MG tablet Take 1 tablet daily as needed. Patient taking differently: Take 20 mgs by mouth once daily as needed for swelling 10/10/17   Minus Breeding, MD  levothyroxine (SYNTHROID, LEVOTHROID) 75 MCG tablet TAKE 1 TABLET DAILY 04/15/18   Chipper Herb, MD  Multiple Vitamin (MULTIVITAMIN) capsule Take 1 capsule daily by mouth.    [provider]  nitroGLYCERIN (NITROSTAT) 0.4 MG SL tablet PLACE 1 TABLET UNDER THE TONGUE AT ONSET OF CHEST PAIN  EVERY 5 MINTUES UP TO 3 TIMES AS NEEDED Patient taking differently: PLACE 1 TABLET UNDER THE TONGUE AT ONSET OF CHEST PAIN EVERY 5 MINTUES UP TO 3 TIMES AS NEEDED FOR CHEST PAIN 05/30/17   Chipper Herb, MD    Family History Family History  Problem Relation Age of Onset  . Stroke Mother 77  . AAA (abdominal aortic aneurysm) Father 52       died from rupture at 70 yo  . Arthritis Father   . Parkinson's disease Sister   . Cancer Son        prostate cancer  . Hypertension Son     Social History Social History   Tobacco Use  . Smoking status: Former Smoker    Packs/day: 0.50    Years: 10.00    Pack years: 5.00    Types: Cigarettes    Start date: 10/21/1946    Last attempt to quit: 10/21/1968    Years since quitting: 49.6  . Smokeless tobacco: Never Used  Substance Use Topics  . Alcohol use: Yes    Alcohol/week: 1.0 standard drinks    Types: 1 Standard drinks or equivalent per week    Frequency: Never    Comment: RARE  . Drug use: No     Allergies   Lipitor [atorvastatin]   Review of  Systems Review of Systems  All other systems reviewed and are negative.    Physical Exam Updated Vital Signs BP 136/79 (BP Location: Left Arm)   Pulse 65   Temp (!) 97.5 F (36.4 C)   Resp 17   SpO2 98%   Physical Exam  Constitutional: He appears well-developed and well-nourished. No distress.  HENT:  Head: Normocephalic and atraumatic.  Right Ear: External ear normal.  Left Ear: External ear normal.  Eyes: Conjunctivae are normal. Right eye exhibits no discharge. Left eye exhibits no discharge. No scleral icterus.  Neck: Neck supple. No tracheal deviation present.  Cardiovascular: Normal rate, regular rhythm and normal heart sounds.  Pulmonary/Chest: Effort normal and breath sounds normal. No stridor. No respiratory distress.  Abdominal: He exhibits no distension.  Genitourinary: Phimosis present. No penile erythema or penile tenderness.  Genitourinary Comments: Patient had his clamp in place, he had significant edema and swelling distal to the clamp.  Unable to retract the foreskin  Musculoskeletal: He exhibits no edema.  Neurological: He is alert. Cranial nerve deficit: no gross deficits.  Skin: Skin is warm and dry. No rash noted.  Psychiatric: He has a normal mood and affect.  Nursing note and vitals reviewed.    ED Treatments / Results  Labs (all labs ordered are listed, but only abnormal results are displayed) Labs Reviewed  CBC - Abnormal; Notable for the following components:      Result Value   Hemoglobin 12.3 (*)    HCT 38.5 (*)    All other components within normal limits  BASIC METABOLIC PANEL - Abnormal; Notable for the following components:   BUN 31 (*)    Creatinine, Ser 1.46 (*)    GFR calc non Af Amer 40 (*)    GFR calc Af Amer 46 (*)    All other components within normal limits    EKG None  Radiology No results found.  Procedures Procedures (including critical care time)  Medications Ordered in ED Medications - No data to  display   Initial Impression / Assessment and Plan / ED Course  I have reviewed the triage vital signs and the nursing  notes.  Pertinent labs & imaging results that were available during my care of the patient were reviewed by me and considered in my medical decision making (see chart for details).  Clinical Course as of Jun 08 1327  Sun Jun 07, 2018  1156 D/w Dr Case Clydene Laming.    Agrees with having pt removed the clamp.  Swelling should decrease.  Hold off on using clamp until the sweling decreases.  Use a pad, wear tight underwear.   [JK]    Clinical Course User Index [JK] Dorie Rank, MD    Patient has developed a phimosis.  All of the edema seems to be distal to the clamp device which I had him remove.  Patient is now having incontinence.  I will consult with urology  Discussed case with Dr. Clydene Laming as documented above.  Patient has been able to urinate without difficulty in the emergency room.  His laboratory tests are unremarkable.  His creatinine is stable.  There is no evidence of infection on my exam and he does not have a leukocytosis.  I will have the patient stop using the clamp.  He will use an undergarment to help him with his continence.  Patient is stable for outpatient urology follow-up.  Final Clinical Impressions(s) / ED Diagnoses   Final diagnoses:  Phimosis  Penile edema  Urinary incontinence, unspecified type    ED Discharge Orders    None       Dorie Rank, MD 06/07/18 1328

## 2018-06-16 DIAGNOSIS — T83191A Other mechanical complication of urinary sphincter implant, initial encounter: Secondary | ICD-10-CM | POA: Diagnosis not present

## 2018-06-29 ENCOUNTER — Other Ambulatory Visit: Payer: Self-pay | Admitting: Family Medicine

## 2018-07-15 ENCOUNTER — Other Ambulatory Visit: Payer: Self-pay | Admitting: Family Medicine

## 2018-07-16 NOTE — Telephone Encounter (Signed)
Last thyroid 10/29/16  DWM

## 2018-07-27 ENCOUNTER — Other Ambulatory Visit: Payer: Self-pay | Admitting: Cardiology

## 2018-07-27 ENCOUNTER — Other Ambulatory Visit: Payer: Self-pay | Admitting: Family Medicine

## 2018-08-11 ENCOUNTER — Ambulatory Visit (INDEPENDENT_AMBULATORY_CARE_PROVIDER_SITE_OTHER): Payer: Medicare Other

## 2018-08-11 DIAGNOSIS — Z23 Encounter for immunization: Secondary | ICD-10-CM

## 2018-08-20 ENCOUNTER — Encounter: Payer: Self-pay | Admitting: Family

## 2018-08-20 ENCOUNTER — Ambulatory Visit (INDEPENDENT_AMBULATORY_CARE_PROVIDER_SITE_OTHER): Payer: Medicare Other | Admitting: Family

## 2018-08-20 VITALS — BP 133/89 | HR 68 | Temp 97.0°F | Ht 64.5 in | Wt 164.0 lb

## 2018-08-20 DIAGNOSIS — B9689 Other specified bacterial agents as the cause of diseases classified elsewhere: Secondary | ICD-10-CM | POA: Diagnosis not present

## 2018-08-20 DIAGNOSIS — I2510 Atherosclerotic heart disease of native coronary artery without angina pectoris: Secondary | ICD-10-CM | POA: Diagnosis not present

## 2018-08-20 DIAGNOSIS — J208 Acute bronchitis due to other specified organisms: Secondary | ICD-10-CM | POA: Diagnosis not present

## 2018-08-20 MED ORDER — AZITHROMYCIN 250 MG PO TABS: ORAL_TABLET | ORAL | 0 refills | Status: DC

## 2018-08-20 NOTE — Patient Instructions (Signed)

## 2018-08-20 NOTE — Progress Notes (Signed)
   Subjective:    Patient ID: Frederick Davis, male    DOB: 1923-06-23, 84 y.o.   MRN: [id number]  Chief Complaint  Patient presents with  . cough and congestion    Cough  This is a new problem. The current episode started 1 to 4 weeks ago. The problem has been gradually worsening. The problem occurs every few minutes. The cough is productive of purulent sputum and productive of sputum. Associated symptoms include nasal congestion, postnasal drip and shortness of breath. Pertinent negatives include no chills, ear congestion, ear pain, fever, headaches, myalgias, sore throat or wheezing. The symptoms are aggravated by lying down. He has tried rest and OTC cough suppressant for the symptoms. The treatment provided mild relief. There is no history of asthma or COPD.      Review of Systems  Constitutional: Negative for chills and fever.  HENT: Positive for postnasal drip. Negative for ear pain and sore throat.   Respiratory: Positive for cough and shortness of breath. Negative for wheezing.   Musculoskeletal: Negative for myalgias.  Neurological: Negative for headaches.  All other systems reviewed and are negative.      Objective:   Physical Exam  Constitutional: He is oriented to person, place, and time. He appears well-developed and well-nourished. No distress.  HENT:  Head: Normocephalic.  Right Ear: External ear normal.  Left Ear: External ear normal.  Mouth/Throat: Oropharynx is clear and moist.  Eyes: Pupils are equal, round, and reactive to light. Right eye exhibits no discharge. Left eye exhibits no discharge.  Neck: Normal range of motion. Neck supple. No thyromegaly present.  Cardiovascular: Normal rate, regular rhythm and intact distal pulses.  Murmur heard. Pulmonary/Chest: Effort normal. No respiratory distress. He has no wheezes. He has rhonchi. He has rales.  Coarse productive cough  Abdominal: Soft. Bowel sounds are normal. He exhibits no distension. There is no  tenderness.  Musculoskeletal: Normal range of motion. He exhibits no edema or tenderness.  Neurological: He is alert and oriented to person, place, and time. He has normal reflexes. No cranial nerve deficit.  Skin: Skin is warm and dry. No rash noted. No erythema.  Psychiatric: He has a normal mood and affect. His behavior is normal. Judgment and thought content normal.  Vitals reviewed.     BP 133/89   Pulse 68   Temp (!) 97 F (36.1 C) (Oral)   Ht 5' 4.5" (1.638 m)   Wt 164 lb (74.4 kg)   BMI 27.72 kg/m      Assessment & Plan:  Frederick Davis comes in today with chief complaint of cough and congestion   Diagnosis and orders addressed:  1. Acute bacterial bronchitis - Take meds as prescribed - Use a cool mist humidifier  -Use saline nose sprays frequently -Force fluids -For any cough or congestion  Use plain Mucinex- regular strength or max strength is fine -For fever or aces or pains- take tylenol or ibuprofen. -Throat lozenges if help -New toothbrush in 3 days -RTO if symptoms worsen or do not improve - azithromycin (ZITHROMAX) 250 MG tablet; Take 500 mg once, then 250 mg for four days  Dispense: 6 tablet; Refill: 0    Evelina Dun, FNP

## 2018-08-24 ENCOUNTER — Telehealth: Payer: Self-pay | Admitting: Family Medicine

## 2018-08-24 MED ORDER — DOXYCYCLINE HYCLATE 100 MG PO TABS: 100.0000 mg | ORAL_TABLET | Freq: Two times a day (BID) | ORAL | 0 refills | Status: DC

## 2018-08-24 NOTE — Telephone Encounter (Signed)
Pt states still have same symptoms: drainage, cough, it won't go away, was told that if he was still having symptoms to call christy back may have to repeat it.   Pharmacy: [facility]

## 2018-08-24 NOTE — Telephone Encounter (Signed)
Please advise 

## 2018-08-24 NOTE — Telephone Encounter (Signed)
Doxycycline Prescription sent to pharmacy   

## 2018-08-24 NOTE — Telephone Encounter (Signed)
Left message - rx sent in

## 2018-09-01 ENCOUNTER — Telehealth: Payer: Self-pay | Admitting: Family Medicine

## 2018-09-02 ENCOUNTER — Other Ambulatory Visit: Payer: Medicare Other

## 2018-09-02 DIAGNOSIS — N183 Chronic kidney disease, stage 3 unspecified: Secondary | ICD-10-CM

## 2018-09-02 DIAGNOSIS — I2510 Atherosclerotic heart disease of native coronary artery without angina pectoris: Secondary | ICD-10-CM

## 2018-09-02 DIAGNOSIS — E559 Vitamin D deficiency, unspecified: Secondary | ICD-10-CM

## 2018-09-02 DIAGNOSIS — I10 Essential (primary) hypertension: Secondary | ICD-10-CM | POA: Diagnosis not present

## 2018-09-02 DIAGNOSIS — E7800 Pure hypercholesterolemia, unspecified: Secondary | ICD-10-CM

## 2018-09-03 ENCOUNTER — Encounter: Payer: Self-pay | Admitting: Family Medicine

## 2018-09-03 ENCOUNTER — Ambulatory Visit (INDEPENDENT_AMBULATORY_CARE_PROVIDER_SITE_OTHER): Payer: Medicare Other | Admitting: Family Medicine

## 2018-09-03 VITALS — BP 122/76 | HR 61 | Temp 96.7°F | Ht 64.5 in | Wt 163.0 lb

## 2018-09-03 DIAGNOSIS — I10 Essential (primary) hypertension: Secondary | ICD-10-CM

## 2018-09-03 DIAGNOSIS — R32 Unspecified urinary incontinence: Secondary | ICD-10-CM | POA: Diagnosis not present

## 2018-09-03 DIAGNOSIS — E559 Vitamin D deficiency, unspecified: Secondary | ICD-10-CM

## 2018-09-03 DIAGNOSIS — I2510 Atherosclerotic heart disease of native coronary artery without angina pectoris: Secondary | ICD-10-CM | POA: Diagnosis not present

## 2018-09-03 DIAGNOSIS — E7800 Pure hypercholesterolemia, unspecified: Secondary | ICD-10-CM

## 2018-09-03 DIAGNOSIS — N183 Chronic kidney disease, stage 3 unspecified: Secondary | ICD-10-CM

## 2018-09-03 DIAGNOSIS — C61 Malignant neoplasm of prostate: Secondary | ICD-10-CM | POA: Diagnosis not present

## 2018-09-03 LAB — CBC WITH DIFFERENTIAL/PLATELET
BASOS ABS: 0.1 10*3/uL (ref 0.0–0.2)
Basos: 1 %
EOS (ABSOLUTE): 0.4 10*3/uL (ref 0.0–0.4)
Eos: 6 %
Hematocrit: 38.6 % (ref 37.5–51.0)
Hemoglobin: 12.4 g/dL — ABNORMAL LOW (ref 13.0–17.7)
Immature Grans (Abs): 0 10*3/uL (ref 0.0–0.1)
Immature Granulocytes: 0 %
LYMPHS ABS: 2.1 10*3/uL (ref 0.7–3.1)
Lymphs: 28 %
MCH: 27.4 pg (ref 26.6–33.0)
MCHC: 32.1 g/dL (ref 31.5–35.7)
MCV: 85 fL (ref 79–97)
MONOS ABS: 0.7 10*3/uL (ref 0.1–0.9)
Monocytes: 10 %
Neutrophils Absolute: 4.1 10*3/uL (ref 1.4–7.0)
Neutrophils: 55 %
Platelets: 276 10*3/uL (ref 150–450)
RBC: 4.52 x10E6/uL (ref 4.14–5.80)
RDW: 13.8 % (ref 12.3–15.4)
WBC: 7.4 10*3/uL (ref 3.4–10.8)

## 2018-09-03 LAB — BMP8+EGFR
BUN / CREAT RATIO: 20 (ref 10–24)
BUN: 32 mg/dL (ref 10–36)
CHLORIDE: 104 mmol/L (ref 96–106)
CO2: 22 mmol/L (ref 20–29)
Calcium: 9.9 mg/dL (ref 8.6–10.2)
Creatinine, Ser: 1.59 mg/dL — ABNORMAL HIGH (ref 0.76–1.27)
GFR, EST AFRICAN AMERICAN: 43 mL/min/{1.73_m2} — AB (ref >59)
GFR, EST NON AFRICAN AMERICAN: 37 mL/min/{1.73_m2} — AB (ref >59)
Glucose: 87 mg/dL (ref 65–99)
Potassium: 4.8 mmol/L (ref 3.5–5.2)
SODIUM: 141 mmol/L (ref 134–144)

## 2018-09-03 LAB — HEPATIC FUNCTION PANEL
ALK PHOS: 128 IU/L — AB (ref 39–117)
ALT: 13 IU/L (ref 0–44)
AST: 23 IU/L (ref 0–40)
Albumin: 3.9 g/dL (ref 3.2–4.6)
BILIRUBIN TOTAL: 0.6 mg/dL (ref 0.0–1.2)
Bilirubin, Direct: 0.18 mg/dL (ref 0.00–0.40)
Total Protein: 6.1 g/dL (ref 6.0–8.5)

## 2018-09-03 LAB — LIPID PANEL
CHOLESTEROL TOTAL: 147 mg/dL (ref 100–199)
Chol/HDL Ratio: 2.3 ratio (ref 0.0–5.0)
HDL: 65 mg/dL (ref >39)
LDL Calculated: 71 mg/dL (ref 0–99)
TRIGLYCERIDES: 56 mg/dL (ref 0–149)
VLDL Cholesterol Cal: 11 mg/dL (ref 5–40)

## 2018-09-03 LAB — VITAMIN D 25 HYDROXY (VIT D DEFICIENCY, FRACTURES): Vit D, 25-Hydroxy: 31.6 ng/mL (ref 30.0–100.0)

## 2018-09-03 NOTE — Patient Instructions (Addendum)
Medicare Annual Wellness Visit  [facility] and the medical providers at [facility] strive to bring you the best medical care.  In doing so we not only want to address your current medical conditions and concerns but also to detect new conditions early and prevent illness, disease and health-related problems.    Medicare offers a yearly Wellness Visit which allows our clinical staff to assess your need for preventative services including immunizations, lifestyle education, counseling to decrease risk of preventable diseases and screening for fall risk and other medical concerns.    This visit is provided free of charge (no copay) for all Medicare recipients. The clinical pharmacists at [facility] have begun to conduct these Wellness Visits which will also include a thorough review of all your medications.    As you primary medical provider recommend that you make an appointment for your Annual Wellness Visit if you have not done so already this year.  You may set up this appointment before you leave today or you may call back ([phone]) and schedule an appointment.  Please make sure when you call that you mention that you are scheduling your Annual Wellness Visit with the clinical pharmacist so that the appointment may be made for the proper length of time.     Continue current medications. Continue good therapeutic lifestyle changes which include good diet and exercise. Fall precautions discussed with patient. If an FOBT was given today- please return it to our front desk. If you are over 66 years old - you may need Prevnar 24 or the adult Pneumonia vaccine.  **Flu shots are available--- please call and schedule a FLU-CLINIC appointment**  After your visit with [place] today you will receive a survey in the mail or online from [employer] regarding your care with [place]. Please take a moment to fill this out. Your feedback is very  important to [place] as you can help [place] better understand your patient needs as well as improve your experience and satisfaction. WE CARE ABOUT YOU!!!   We will arrange to get another opinion regarding the ongoing incontinence to see if any other options might be available to help with the situation that you currently have. Continue with Mucinex for at least another couple weeks and drink plenty of water Continue work with diet and exercise as much as possible to keep cholesterol under the best control possible Follow-up with cardiology as planned

## 2018-09-03 NOTE — Progress Notes (Signed)
Subjective:    Patient ID: Frederick Davis, male    DOB: [birth year], 84 y.o.   MRN: [id number]  HPI Pt here for follow up and management of chronic medical problems which includes hypertension and hyperlipidemia. He is taking medication regularly.  The patient is doing well other than ongoing concerns about his constant urinary incontinence.  This is secondary to having have had the prosthesis removed several months ago.  He is frustrated with this and understands that he may have to accept the ongoing incontinence or use of a catheter.  He has had blood work done and this will be reviewed with him during the visit today.  All liver function tests were within normal limits except one, the alkaline phosphatase was slightly elevated and we will continue to monitor that.  The blood sugar is good at 87 and the creatinine was slightly higher than previously at 1.59.  All of the electrolytes including potassium are good.  The CBC had a normal white blood cell count a stable hemoglobin at 12.4 and an adequate platelet count.  All cholesterol numbers were good except the LDL C was slightly elevated based on his history of a CABG and he is currently not taking any statin therapy.  His HDL was good at 65 and triglycerides are good at 56.  The vitamin D level was good at 31.6 and because of the elevated creatinine no additional vitamin D would be recommended.  The note that was received from the patient's wife will be scanned into the record about the patient's wife's concern for getting another opinion regarding his ongoing incontinence issues.  We will add a thyroid profile to his current blood work.  The patient has multiple medical issues but in fact despite these diagnoses is doing extremely well.  He did have a CABG in the past 2 to 3 years.  He has a colostomy because of past colon cancer.  He had a cuff erosion of his artificial urinary sphincter and had to have a penile prosthesis removed and has ongoing  incontinence that is worrying him constantly.  He has a history of renal cell carcinoma with nephrectomy.  He has had prostate cancer.  He sees the cardiologist regularly and is doing fairly well as far as his cardiac status is concerned at 84 years of age.  The patient today has no complaints of chest pain.  He does have an appointment with a cardiologist in the next week or so.  He sees him every 6 months.  He does have some slight increase in shortness of breath.  He denies any trouble with his stomach including nausea vomiting diarrhea blood in the stool black tarry bowel movements or change in bowel habits.  He is continued to have ongoing incontinence constantly.  He does use a clamp but this causes a lot of swelling and edema.  This is the only alternative other than wearing depends constantly.  We will assist him in getting another opinion regarding other options that may be out there knowing that there may not be any other options.  He is interested in this.  We will add a thyroid profile to his current blood work.    Patient Active Problem List   Diagnosis Date Noted  . Cuff erosion of artificial urinary sphincter ([place]) 02/23/2018  . Malfunction of penile prosthesis ([place]) 12/15/2017  . Renal cell carcinoma, right ([place]) 08/11/2017  . Vitamin D deficiency 08/11/2017  . ASCVD (arteriosclerotic cardiovascular disease) 08/11/2017  .  Aortic stenosis 05/28/2017  . Mitral regurgitation 05/28/2017  . Chest pain 05/27/2017  . Coronary artery disease involving native coronary artery of native heart without angina pectoris 03/19/2017  . Dyslipidemia 03/19/2017  . S/P CABG x 3 12/08/2015  . Unstable angina ([place])   . Chest pain with moderate risk of acute coronary syndrome 12/05/2015  . Aortic stenosis, mild-2014 12/05/2015  . Anginal pain ([place]) 10/17/2015  . History of renal cell cancer-s/p nephrectomy 07/19/2013  . Hyperlipidemia 07/19/2013  . Hypotension arterial 03/18/2013  . Complete heart  block ([place]) 03/18/2013  . Anemia   . CKD (chronic kidney disease) stage 3, GFR 30-59 ml/min (HCC) 03/12/2013  . Acute MI, inferior wall, initial episode of care ([place]) 03/12/2013  . Impotence 03/12/2013  . History of renal stone 03/12/2013  . Hiatal hernia 03/12/2013  . H/O lithotripsy 03/12/2013  . SCCA (squamous cell carcinoma) of skin 03/12/2013  . S/P prostatectomy, radical 03/12/2013  . Left renal mass 03/12/2013  . Arthritis 03/12/2013  . History of STEMI- May 2014 03/12/2013  . CAD S/P RCA thrombectomy May 2014, CABG 11/2015 03/12/2013  . Cardiogenic shock ([place]) 03/12/2013  . Acute respiratory failure ([place]) 03/12/2013  . Altered mental status 03/12/2013  . Essential hypertension, benign 01/14/2013  . Colostomy in place [facility]) 01/14/2013  . Prostate cancer ([place]) 01/14/2013  . Renal cell carcinoma, Right 01/14/2013  . Osteoporosis, unspecified 01/14/2013   Outpatient Encounter Medications as of 09/03/2018  Medication Sig  . acetaminophen (TYLENOL) 500 MG tablet Take 500 mg by mouth at bedtime.  Marland Kitchen amLODipine (NORVASC) 2.5 MG tablet TAKE 1 TABLET DAILY  . aspirin 81 MG tablet Take 1 tablet (81 mg total) by mouth daily. (Patient taking differently: Take 81 mg by mouth at bedtime. )  . carvedilol (COREG) 3.125 MG tablet TAKE  (1)  TABLET TWICE A DAY WITH MEALS (BREAKFAST AND SUPPER)  . doxycycline (VIBRA-TABS) 100 MG tablet Take 1 tablet (100 mg total) by mouth 2 (two) times daily.  . furosemide (LASIX) 20 MG tablet Take 1 tablet daily as needed. (Patient taking differently: Take 20 mgs by mouth once daily as needed for swelling)  . guaiFENesin (MUCINEX PO) Take by mouth.  . levothyroxine (SYNTHROID, LEVOTHROID) 75 MCG tablet TAKE 1 TABLET DAILY  . Multiple Vitamin (MULTIVITAMIN) capsule Take 1 capsule daily by mouth.  . nitroGLYCERIN (NITROSTAT) 0.4 MG SL tablet PLACE 1 TABLET UNDER THE TONGUE AT ONSET OF CHEST PAIN EVERY 5 MINTUES UP TO 3 TIMES AS NEEDED (Patient taking differently:  PLACE 1 TABLET UNDER THE TONGUE AT ONSET OF CHEST PAIN EVERY 5 MINTUES UP TO 3 TIMES AS NEEDED FOR CHEST PAIN)  . [DISCONTINUED] azithromycin (ZITHROMAX) 250 MG tablet Take 500 mg once, then 250 mg for four days   Facility-Administered Encounter Medications as of 09/03/2018  Medication  . etomidate (AMIDATE) injection  . lidocaine (cardiac) 100 mg/52ml (XYLOCAINE) 20 MG/ML injection 2%  . succinylcholine (ANECTINE) injection     Review of Systems  Constitutional: Negative.   HENT: Negative.   Eyes: Negative.   Respiratory: Negative.   Cardiovascular: Negative.   Gastrointestinal: Negative.   Endocrine: Negative.   Genitourinary: Negative.        Urine incont.   Musculoskeletal: Negative.   Skin: Negative.   Allergic/Immunologic: Negative.   Neurological: Negative.   Hematological: Negative.   Psychiatric/Behavioral: Negative.        Objective:   Physical Exam  Constitutional: He is oriented to person, place, and time. He appears well-developed  and well-nourished. No distress.  Patient is pleasant and alert and has some difficulty hearing but mentally is doing well for soon-to-be 84 year old man.  HENT:  Head: Normocephalic and atraumatic.  Right Ear: External ear normal.  Left Ear: External ear normal.  Nose: Nose normal.  Mouth/Throat: Oropharynx is clear and moist. No oropharyngeal exudate.  Eyes: Pupils are equal, round, and reactive to light. Conjunctivae and EOM are normal. Right eye exhibits no discharge. Left eye exhibits no discharge. No scleral icterus.  Neck: Normal range of motion. Neck supple. No thyromegaly present.  No bruits thyromegaly or anterior cervical adenopathy  Cardiovascular: Normal rate and intact distal pulses.  Murmur heard. Heart is slightly irregular at 60/min with a grade 3/6 systolic ejection murmur  Pulmonary/Chest: Effort normal and breath sounds normal. He has no wheezes. He has no rales.  Clear anteriorly and posteriorly with no rales or  rhonchi.  No axillary adenopathy.  Abdominal: Soft. Bowel sounds are normal. He exhibits no mass. There is no tenderness.  Colostomy bag in place abdomen nontender.  No masses organ enlargement or bruits  Genitourinary:  Genitourinary Comments: Patient has clamp in place there is no tenderness.  On the penis and there is a lot of edema peripheral to this.  There is no redness or sign of infection.  Musculoskeletal: He exhibits tenderness and deformity. He exhibits no edema.  Uses a cane for ambulation because of osteoarthritis and knees.  Lymphadenopathy:    He has no cervical adenopathy.  Neurological: He is alert and oriented to person, place, and time. He has normal reflexes.  Skin: Skin is warm and dry. No rash noted.  Psychiatric: He has a normal mood and affect. His behavior is normal. Judgment and thought content normal.  The patient's mood affect and behavior are normal for him.  Nursing note and vitals reviewed.   BP 122/76 (BP Location: Left Arm)   Pulse 61   Temp (!) 96.7 F (35.9 C) (Oral)   Ht 5' 4.5" (1.638 m)   Wt 163 lb (73.9 kg)   BMI 27.55 kg/m        Assessment & Plan:  1. Pure hypercholesterolemia -Continue with diet and exercise as much as possible to keep LDL cholesterol down to 70 or less.  2. Essential hypertension, benign -Blood pressure is good today and patient will continue with his current cardiac medicines.  3. ASCVD (arteriosclerotic cardiovascular disease) -Follow-up with cardiology as planned  4. Vitamin D deficiency -Vitamin D level was at the low end of the normal range but within normal limits.  5. Prostate cancer [facility]) -Follow-up with urology as planned - Ambulatory referral to Urology  6. CKD (chronic kidney disease) stage 3, GFR 30-59 ml/min (HCC) -We will continue to monitor this and patient is aware to avoid NSAIDs and keep blood pressure under the best control possible.  7. Urinary incontinence, unspecified type -For the time  being continue with clamp and depends - Ambulatory referral to Urology  Patient Instructions                       Medicare Annual Wellness Visit  [facility] and the medical providers at [facility] strive to bring you the best medical care.  In doing so we not only want to address your current medical conditions and concerns but also to detect new conditions early and prevent illness, disease and health-related problems.    Medicare offers a yearly Wellness Visit which  allows our clinical staff to assess your need for preventative services including immunizations, lifestyle education, counseling to decrease risk of preventable diseases and screening for fall risk and other medical concerns.    This visit is provided free of charge (no copay) for all Medicare recipients. The clinical pharmacists at [facility] have begun to conduct these Wellness Visits which will also include a thorough review of all your medications.    As you primary medical provider recommend that you make an appointment for your Annual Wellness Visit if you have not done so already this year.  You may set up this appointment before you leave today or you may call back ([phone]) and schedule an appointment.  Please make sure when you call that you mention that you are scheduling your Annual Wellness Visit with the clinical pharmacist so that the appointment may be made for the proper length of time.     Continue current medications. Continue good therapeutic lifestyle changes which include good diet and exercise. Fall precautions discussed with patient. If an FOBT was given today- please return it to our front desk. If you are over 78 years old - you may need Prevnar 85 or the adult Pneumonia vaccine.  **Flu shots are available--- please call and schedule a FLU-CLINIC appointment**  After your visit with [place] today you will receive a survey in the mail or online from [employer] regarding your care with [place]. Please take a moment to fill this out. Your feedback is very important to [place] as you can help [place] better understand your patient needs as well as improve your experience and satisfaction. WE CARE ABOUT YOU!!!   We will arrange to get another opinion regarding the ongoing incontinence to see if any other options might be available to help with the situation that you currently have. Continue with Mucinex for at least another couple weeks and drink plenty of water   Arrie Senate MD

## 2018-09-05 LAB — THYROID PANEL WITH TSH
Free Thyroxine Index: 2.2 (ref 1.2–4.9)
T3 Uptake Ratio: 29 % (ref 24–39)
T4 TOTAL: 7.7 ug/dL (ref 4.5–12.0)
TSH: 2.17 u[IU]/mL (ref 0.450–4.500)

## 2018-09-05 LAB — SPECIMEN STATUS REPORT

## 2018-09-07 ENCOUNTER — Other Ambulatory Visit: Payer: Self-pay | Admitting: Cardiology

## 2018-09-08 NOTE — Progress Notes (Signed)
Dictation #1 [id number]  [id number]     HPI The patient presents for followup after a acute inferior wall infarct in May of 2014. He had thrombectomy of a right coronary artery occlusion. He was initially acute respiratory failure and was cooled. However, he responded very well to therapy.   Last year he had chest pain.  He was sent for cardiac catheterization. This demonstrated severe LAD stenosis and RCA stenosis. He subsequently underwent a CABG 3 on February 2017.    He had a Holter in Feb without sustained arrhythmia but with frequent PVCs.   Since I last saw him he has had no cardiac problems.  Unfortunately he has had lots of urologic problems.  He denies any chest pressure, neck or arm discomfort.  He said no palpitations, presyncope or syncope.  He has had no shortness of breath, PND or orthopnea.  He lost his balance today and fell.   Allergies  Allergen Reactions  . Lipitor [Atorvastatin] Other (See Comments)    Muscle aches   Prior to Admission medications   Medication Sig Start Date End Date Taking? Authorizing Provider  acetaminophen (TYLENOL) 500 MG tablet Take 500 mg by mouth at bedtime.   Yes [provider]  amLODipine (NORVASC) 2.5 MG tablet Take 1 tablet (2.5 mg total) by mouth daily. 04/21/17  Yes Minus Breeding, MD  aspirin 81 MG tablet Take 1 tablet (81 mg total) by mouth daily. Patient taking differently: Take 81 mg by mouth every evening.  03/18/13  Yes Barrett, Evelene Croon, PA-C  carvedilol (COREG) 3.125 MG tablet TAKE  (1)  TABLET TWICE A DAY WITH MEALS (BREAKFAST AND SUPPER) 07/24/17  Yes Chipper Herb, MD  furosemide (LASIX) 20 MG tablet Take 1 tablet daily as needed. Patient taking differently: Take one tablet by mouth once every other day 04/07/17  Yes Mckala Pantaleon, Jeneen Rinks, MD  levothyroxine (SYNTHROID, LEVOTHROID) 75 MCG tablet Take 75 mcg by mouth daily before breakfast.   Yes [provider]  Multiple Vitamin (MULTIVITAMIN) capsule Take 1  capsule daily by mouth.   Yes [provider]  nitroGLYCERIN (NITROSTAT) 0.4 MG SL tablet PLACE 1 TABLET UNDER THE TONGUE AT ONSET OF CHEST PAIN EVERY 5 MINTUES UP TO 3 TIMES AS NEEDED 05/30/17  Yes Chipper Herb, MD    Past Medical History:  Diagnosis Date  . Anemia   . Aortic stenosis    a. mild-mod by TEE 11/2015.  . Arthritis    left knee   . CAD (coronary artery disease)    a. RCA thrombectomy 2014. b. CABGx3 in 11/2015 (EF 45-50% in 11/2015).  . Chronic renal insufficiency, stage III (moderate) (HCC)    only has 0ne kidney  . Dyspnea    WITH EXERTION  . Esophageal stricture YRS AGO  . Hypertension   . Hypothyroidism   . Kidney stone 1969  . MI (myocardial infarction) ([place]) 2017  . Mitral regurgitation    a. mild by TEE 11/2015.  Marland Kitchen Myocardial infarction ([place]) 03/12/13   Inferior MI.  LAD mid 80% stenosis, circ mild plaque, RCA 100% with thrombectomy PTCA.  EF 65%  . Osteoporosis   . PC (prostate cancer) ([place]) 1987  . RBBB   . Rectum cancer ([place]) 1986  . Renal cell cancer ([place]) 2005    Past Surgical History:  Procedure Laterality Date  . CARDIAC CATHETERIZATION N/A 12/06/2015   Procedure: Left Heart Cath and Coronary Angiography;  Surgeon: Burnell Blanks, MD;  Location: [facility];  Service: Cardiovascular;  Laterality: N/A;  . COLONOSCOPY  01/08/2012   Procedure: COLONOSCOPY;  Surgeon: Rogene Houston, MD;  [facility];  Service: Endoscopy;  Laterality: N/A;  1030  . COLONOSCOPY N/A 07/20/2015   Procedure: COLONOSCOPY;  Surgeon: Rogene Houston, MD;  [facility];  Service: Endoscopy;  Laterality: N/A;  930 - moved to 9/29 @ 2:00  . COLOSTOMY Left 1986   colon cancer  . CORONARY ARTERY BYPASS GRAFT N/A 12/08/2015   Procedure: CORONARY ARTERY BYPASS GRAFTING (CABG);  Surgeon: [place] Nakayama, MD;  Location: [facility];  Service: Open Heart Surgery;  Laterality: N/A;  Times 3 using left internal mammary artery and endoscopycally  harvested bilateral saphenous vein.  Marland Kitchen HERNIA REPAIR     2007 RIGHT INGUINAL HERNIA  . KIDNEY SURGERY Right 2004  . LEFT HEART CATHETERIZATION WITH CORONARY ANGIOGRAM N/A 03/12/2013   Procedure: STEMI;  Surgeon: Burnell Blanks, MD;  Location: [facility] CATH LAB;  Service: Cardiovascular;  Laterality: N/A;  . PERCUTANEOUS CORONARY INTERVENTION-BALLOON ONLY    . PROSTATECTOMY    . [place]  2006  . REMOVAL OF PENILE PROSTHESIS N/A 12/15/2017   Procedure: REMOVAL OF PENILE PROSTHESIS;  Surgeon: Franchot Gallo, MD;  [facility];  Service: Urology;  Laterality: N/A;  . REMOVAL OF PENILE PROSTHESIS N/A 02/23/2018   Procedure: EXPLANT OF ERODED SPHINCTER;  Surgeon: Franchot Gallo, MD;  [facility];  Service: Urology;  Laterality: N/A;  . renal sphincter appliance  2007  . TEE WITHOUT CARDIOVERSION N/A 12/08/2015   Procedure: TRANSESOPHAGEAL ECHOCARDIOGRAM (TEE);  Surgeon: [place] Nakayama, MD;  Location: [facility];  Service: Open Heart Surgery;  Laterality: N/A;    ROS: As stated in the HPI and negative for all other systems.  PHYSICAL EXAM BP 98/61   Pulse 63   Ht 5' 4.5" (1.638 m)   Wt 163 lb 6.4 oz (74.1 kg)   SpO2 96%   BMI 27.61 kg/m   GENERAL:  Well appearing for his age NECK:  No jugular venous distention, waveform within normal limits, carotid upstroke brisk and symmetric, no bruits, no thyromegaly LUNGS:  Clear to auscultation bilaterally CHEST: Well-healed sternotomy scar HEART:  PMI not displaced or sustained,S1 and S2 within normal limits, no S3, no S4, no clicks, no rubs, 3 out of 6 apical mid peaking systolic murmur radiating slightly at the aortic outflow tract, no diastolic murmurs ABD:  Flat, positive bowel sounds normal in frequency in pitch, no bruits, no rebound, no guarding, no midline pulsatile mass, no hepatomegaly, no splenomegaly EXT:  2 plus pulses throughout, no edema, no cyanosis no clubbing   EKG: Sinus rhythm, rate 62,  right bundle branch, first-degree AV block, no acute ST-T wave changes, no change from previous.  Lab Results  Component Value Date   CHOL 147 09/02/2018   TRIG 56 09/02/2018   HDL 65 09/02/2018   LDLCALC 71 09/02/2018     ASSESSMENT AND PLAN  CAD/CABG:    Patient has no symptoms related to this.  No further cardiovascular testing is suggested.  HTN:  The blood pressure is low but this is unusual.  If however I find that he continues to run low I will discontinue his beta-blocker.  They will keep an eye on the blood pressures.   DYSLIPIDEMIA:  He has been intolerant of 1 statin in the past.  His labs are excellent.  No change in therapy.  AORTIC STENOSIS:  This  is moderate.  I will follow-up with an echo around August of next year.

## 2018-09-09 ENCOUNTER — Encounter: Payer: Self-pay | Admitting: Cardiology

## 2018-09-09 ENCOUNTER — Ambulatory Visit (INDEPENDENT_AMBULATORY_CARE_PROVIDER_SITE_OTHER): Payer: Medicare Other | Admitting: Cardiology

## 2018-09-09 VITALS — BP 98/61 | HR 63 | Ht 64.5 in | Wt 163.4 lb

## 2018-09-09 DIAGNOSIS — I2510 Atherosclerotic heart disease of native coronary artery without angina pectoris: Secondary | ICD-10-CM

## 2018-09-09 DIAGNOSIS — I10 Essential (primary) hypertension: Secondary | ICD-10-CM

## 2018-09-09 DIAGNOSIS — I350 Nonrheumatic aortic (valve) stenosis: Secondary | ICD-10-CM | POA: Diagnosis not present

## 2018-09-09 NOTE — Patient Instructions (Signed)
Medication Instructions:  The current medical regimen is effective;  continue present plan and medications.  If you need a refill on your cardiac medications before your next appointment, please call your pharmacy.   Testing/Procedures: Your physician has requested that you have an echocardiogram in August 2020. Echocardiography is a painless test that uses sound waves to create images of your heart. It provides your doctor with information about the size and shape of your heart and how well your heart's chambers and valves are working. This procedure takes approximately one hour. There are no restrictions for this procedure.  Follow-Up: Follow up in August 2020 with Dr Percival Spanish, after your 2 D Echo.  Thank you for choosing [facility]!!

## 2018-10-07 ENCOUNTER — Other Ambulatory Visit: Payer: Self-pay | Admitting: Nurse Practitioner

## 2018-10-19 ENCOUNTER — Other Ambulatory Visit: Payer: Self-pay | Admitting: Cardiology

## 2018-10-28 DIAGNOSIS — Z85528 Personal history of other malignant neoplasm of kidney: Secondary | ICD-10-CM | POA: Diagnosis not present

## 2018-10-28 DIAGNOSIS — N529 Male erectile dysfunction, unspecified: Secondary | ICD-10-CM | POA: Diagnosis not present

## 2018-10-28 DIAGNOSIS — R32 Unspecified urinary incontinence: Secondary | ICD-10-CM | POA: Diagnosis not present

## 2018-10-28 DIAGNOSIS — Z8546 Personal history of malignant neoplasm of prostate: Secondary | ICD-10-CM | POA: Diagnosis not present

## 2018-10-28 DIAGNOSIS — Z85038 Personal history of other malignant neoplasm of large intestine: Secondary | ICD-10-CM | POA: Diagnosis not present

## 2018-11-09 ENCOUNTER — Encounter: Payer: Self-pay | Admitting: Family Medicine

## 2018-11-09 DIAGNOSIS — D485 Neoplasm of uncertain behavior of skin: Secondary | ICD-10-CM | POA: Diagnosis not present

## 2018-11-09 DIAGNOSIS — C4442 Squamous cell carcinoma of skin of scalp and neck: Secondary | ICD-10-CM | POA: Diagnosis not present

## 2018-11-09 DIAGNOSIS — D2261 Melanocytic nevi of right upper limb, including shoulder: Secondary | ICD-10-CM | POA: Diagnosis not present

## 2018-11-09 DIAGNOSIS — Z85828 Personal history of other malignant neoplasm of skin: Secondary | ICD-10-CM | POA: Diagnosis not present

## 2018-11-09 DIAGNOSIS — L821 Other seborrheic keratosis: Secondary | ICD-10-CM | POA: Diagnosis not present

## 2018-11-09 DIAGNOSIS — D0439 Carcinoma in situ of skin of other parts of face: Secondary | ICD-10-CM | POA: Diagnosis not present

## 2018-11-09 DIAGNOSIS — L570 Actinic keratosis: Secondary | ICD-10-CM | POA: Diagnosis not present

## 2018-11-09 DIAGNOSIS — D225 Melanocytic nevi of trunk: Secondary | ICD-10-CM | POA: Diagnosis not present

## 2018-11-09 DIAGNOSIS — D044 Carcinoma in situ of skin of scalp and neck: Secondary | ICD-10-CM | POA: Diagnosis not present

## 2018-11-11 DIAGNOSIS — N2889 Other specified disorders of kidney and ureter: Secondary | ICD-10-CM | POA: Diagnosis not present

## 2018-11-11 DIAGNOSIS — R32 Unspecified urinary incontinence: Secondary | ICD-10-CM | POA: Diagnosis not present

## 2018-11-18 DIAGNOSIS — N529 Male erectile dysfunction, unspecified: Secondary | ICD-10-CM | POA: Diagnosis not present

## 2018-11-18 DIAGNOSIS — Z85528 Personal history of other malignant neoplasm of kidney: Secondary | ICD-10-CM | POA: Diagnosis not present

## 2018-11-18 DIAGNOSIS — R32 Unspecified urinary incontinence: Secondary | ICD-10-CM | POA: Diagnosis not present

## 2018-11-18 DIAGNOSIS — Z8546 Personal history of malignant neoplasm of prostate: Secondary | ICD-10-CM | POA: Diagnosis not present

## 2018-11-18 DIAGNOSIS — Z85038 Personal history of other malignant neoplasm of large intestine: Secondary | ICD-10-CM | POA: Diagnosis not present

## 2018-11-24 ENCOUNTER — Other Ambulatory Visit: Payer: Self-pay | Admitting: Family Medicine

## 2019-01-01 ENCOUNTER — Telehealth: Payer: Self-pay

## 2019-01-01 DIAGNOSIS — Z905 Acquired absence of kidney: Secondary | ICD-10-CM | POA: Diagnosis not present

## 2019-01-01 DIAGNOSIS — Z8546 Personal history of malignant neoplasm of prostate: Secondary | ICD-10-CM | POA: Diagnosis not present

## 2019-01-01 DIAGNOSIS — N393 Stress incontinence (female) (male): Secondary | ICD-10-CM | POA: Diagnosis not present

## 2019-01-01 DIAGNOSIS — Z85528 Personal history of other malignant neoplasm of kidney: Secondary | ICD-10-CM | POA: Diagnosis not present

## 2019-01-01 DIAGNOSIS — N289 Disorder of kidney and ureter, unspecified: Secondary | ICD-10-CM | POA: Diagnosis not present

## 2019-01-01 DIAGNOSIS — Z87442 Personal history of urinary calculi: Secondary | ICD-10-CM | POA: Diagnosis not present

## 2019-01-01 DIAGNOSIS — N5231 Erectile dysfunction following radical prostatectomy: Secondary | ICD-10-CM | POA: Diagnosis not present

## 2019-01-01 DIAGNOSIS — N183 Chronic kidney disease, stage 3 (moderate): Secondary | ICD-10-CM | POA: Diagnosis not present

## 2019-01-01 NOTE — Telephone Encounter (Signed)
   [facility] Pre-operative Risk Assessment    Request for surgical clearance:  1. What type of surgery is being performed? Urology procedure  2. When is this surgery scheduled? TBD  3. What type of clearance is required (medical clearance vs. Pharmacy clearance to hold med vs. Both)? Pharmacy  4. Are there any medications that need to be held prior to surgery and how long? Aspirin  5. Practice name and name of physician performing surgery? [facility]  Dr.Ryan Terlecki  6. What is your office phone number [phone]   7.   What is your office fax number [phone]  8.   Anesthesia type Not listed   Kathyrn Lass 01/01/2019, 5:44 PM  _________________________________________________________________   (provider comments below)

## 2019-01-04 ENCOUNTER — Other Ambulatory Visit: Payer: Self-pay | Admitting: Physician Assistant

## 2019-01-04 MED ORDER — NITROGLYCERIN 0.4 MG SL SUBL: SUBLINGUAL_TABLET | SUBLINGUAL | 2 refills | Status: DC

## 2019-01-04 NOTE — Telephone Encounter (Signed)
   Primary Cardiologist: Minus Breeding, MD  Chart reviewed as part of pre-operative protocol coverage. Patient was contacted 01/04/2019 in reference to pre-operative risk assessment for pending surgery as outlined below.  Frederick Davis [facility] was last seen on 09/09/2018 by Dr. Percival Spanish.  Since that day, Frederick Davis has done well without any exertional chest pain or shortness of breath. Despite his advanced age, he is able to exercise for at least 15 minutes twice a week.   Therefore, based on ACC/AHA guidelines, the patient would be at acceptable risk for the planned procedure without further cardiovascular testing.   I will route this recommendation to the requesting party via Epic fax function and remove from pre-op pool.  Please call with questions. Will forward to Dr. Percival Spanish to comment on whether able to hold aspirin for 7 days prior to surgery and restart after the procedure as soon as possible. Although since this procedure is elective, patient likely will delay the proceed until after the corona virus outbreak.   [place], [place] 01/04/2019, 2:15 PM

## 2019-01-07 NOTE — Telephone Encounter (Signed)
Yes it would be not be ideal but ASA could be held for seven days as needed for surgery.

## 2019-01-08 NOTE — Telephone Encounter (Signed)
   Primary Cardiologist: Minus Breeding, MD  Chart reviewed as part of pre-operative protocol coverage. Given past medical history and time since last visit, based on ACC/AHA guidelines, JAYDON AVINA would be at acceptable risk for the planned procedure without further cardiovascular testing.   If aspirin must be held prior to the procedure OK to hold 7 days pre op.  I will route this recommendation to the requesting party via Epic fax function and remove from pre-op pool.  Please call with questions.  Kerin Ransom, PA-C 01/08/2019, 8:25 AM

## 2019-01-13 ENCOUNTER — Ambulatory Visit: Payer: Medicare Other | Admitting: Family Medicine

## 2019-01-25 ENCOUNTER — Other Ambulatory Visit: Payer: Self-pay | Admitting: Family Medicine

## 2019-02-18 ENCOUNTER — Other Ambulatory Visit: Payer: Self-pay

## 2019-02-18 ENCOUNTER — Encounter: Payer: Self-pay | Admitting: Family Medicine

## 2019-02-18 ENCOUNTER — Ambulatory Visit (INDEPENDENT_AMBULATORY_CARE_PROVIDER_SITE_OTHER): Payer: Medicare Other | Admitting: Family Medicine

## 2019-02-18 DIAGNOSIS — E782 Mixed hyperlipidemia: Secondary | ICD-10-CM | POA: Diagnosis not present

## 2019-02-18 DIAGNOSIS — I10 Essential (primary) hypertension: Secondary | ICD-10-CM | POA: Diagnosis not present

## 2019-02-18 DIAGNOSIS — E559 Vitamin D deficiency, unspecified: Secondary | ICD-10-CM | POA: Diagnosis not present

## 2019-02-18 DIAGNOSIS — N183 Chronic kidney disease, stage 3 unspecified: Secondary | ICD-10-CM

## 2019-02-18 DIAGNOSIS — C641 Malignant neoplasm of right kidney, except renal pelvis: Secondary | ICD-10-CM

## 2019-02-18 DIAGNOSIS — I2510 Atherosclerotic heart disease of native coronary artery without angina pectoris: Secondary | ICD-10-CM | POA: Diagnosis not present

## 2019-02-18 DIAGNOSIS — C61 Malignant neoplasm of prostate: Secondary | ICD-10-CM | POA: Diagnosis not present

## 2019-02-18 DIAGNOSIS — Z951 Presence of aortocoronary bypass graft: Secondary | ICD-10-CM

## 2019-02-18 DIAGNOSIS — I739 Peripheral vascular disease, unspecified: Secondary | ICD-10-CM | POA: Diagnosis not present

## 2019-02-18 DIAGNOSIS — R32 Unspecified urinary incontinence: Secondary | ICD-10-CM | POA: Diagnosis not present

## 2019-02-18 DIAGNOSIS — Z933 Colostomy status: Secondary | ICD-10-CM

## 2019-02-18 NOTE — Progress Notes (Signed)
Virtual Visit Via telephone Note I connected with@ on 02/18/19 by telephone and verified that I am speaking with the correct person or authorized healthcare agent using two identifiers. Frederick Davis is currently located at home and there are no unauthorized people in close proximity. I completed this visit while in a private location in my home I connected to the patient by telephone and verified that I was speaking with the correct person..  This visit type was conducted due to national recommendations for restrictions regarding the COVID-19 Pandemic (e.g. social distancing).  This format is felt to be most appropriate for this patient at this time.  All issues noted in this document were discussed and addressed.  No physical exam was performed.    I discussed the limitations, risks, security and privacy concerns of performing an evaluation and management service by telephone and the availability of in person appointments. I also discussed with the patient that there may be a patient responsible charge related to this service. The patient expressed understanding and agreed to proceed.   Date:  02/18/2019    ID:  Frederick Davis      [id number]        [id number]   Patient Care Team Patient Care Team: Chipper Herb, MD as PCP - General (Family Medicine) Minus Breeding, MD as PCP - Cardiology (Cardiology) Burman Foster, MD as Physician Assistant (Urology) Danella Sensing, MD as Consulting Physician (Dermatology) Gaynelle Arabian, MD as Consulting Physician (Orthopedic Surgery) Rogene Houston, MD as Consulting Physician (Gastroenterology)  Reason for Visit: Primary Care Follow-up     History of Present Illness & Review of Systems:     Frederick Davis is a 85 y.o. year old male primary care patient that presents today for a telehealth visit.  The patient is pleasant and doing well overall.  He is 85 years old.  He sees the cardiologist regularly and has a visit coming up soon  with him which will probably be a tele-visit.  The patient today denies any chest pain pressure or tightness.  He denies any shortness of breath other than some shortness of breath with coming up stairs from the basement after exercising for 30 minutes daily.  His GI status is stable with no nausea vomiting diarrhea blood in the stool or black tarry bowel movements.  He still has to use a clamp on the penis for helping his urinary incontinence.  He is planning surgery on June 16 by Dr. Odis Luster at [facility].  He will be in the hospital overnight after the surgery this is to attempt to get better control for urinary incontinence following the removal of the penile prosthesis.  He sees Dr. Percival Spanish sometime in May or June.  He continues to have problems with his knees and will continue to work with that without planning any surgery.    Review of systems as stated otherwise negative for body systems left unmentioned.   The patient does not have symptoms concerning for COVID-19 infection (fever, chills, cough, or new shortness of breath).      Current Medications (Verified) Allergies as of 02/18/2019      Reactions   Lipitor [atorvastatin] Other (See Comments)   Muscle aches      Medication List       Accurate as of February 18, 2019  8:33 AM. Always use your most recent med list.        acetaminophen 500 MG  tablet Commonly known as:  TYLENOL Take 500 mg by mouth at bedtime.   amLODipine 2.5 MG tablet Commonly known as:  NORVASC TAKE 1 TABLET DAILY   aspirin 81 MG tablet Take 1 tablet (81 mg total) by mouth daily.   carvedilol 3.125 MG tablet Commonly known as:  COREG TAKE  (1)  TABLET TWICE A DAY WITH MEALS (BREAKFAST AND SUPPER)   furosemide 20 MG tablet Commonly known as:  LASIX Take 1 tablet daily as needed.   levothyroxine 75 MCG tablet Commonly known as:  SYNTHROID TAKE 1 TABLET DAILY   MUCINEX PO Take by mouth daily.   multivitamin  capsule Take 1 capsule daily by mouth.   nitroGLYCERIN 0.4 MG SL tablet Commonly known as:  NITROSTAT PLACE 1 TABLET UNDER THE TONGUE AT ONSET OF CHEST PAIN EVERY 5 MINTUES UP TO 3 TIMES AS NEEDED FOR CHEST PAIN           Allergies (Verified)    Lipitor [atorvastatin]  Past Medical History Past Medical History:  Diagnosis Date  . Anemia   . Aortic stenosis    a. mild-mod by TEE 11/2015.  . Arthritis    left knee   . CAD (coronary artery disease)    a. RCA thrombectomy 2014. b. CABGx3 in 11/2015 (EF 45-50% in 11/2015).  . Chronic renal insufficiency, stage III (moderate) (HCC)    only has 0ne kidney  . Dyspnea    WITH EXERTION  . Esophageal stricture YRS AGO  . Hypertension   . Hypothyroidism   . Kidney stone 1969  . MI (myocardial infarction) ([place]) 2017  . Mitral regurgitation    a. mild by TEE 11/2015.  Marland Kitchen Myocardial infarction ([place]) 03/12/13   Inferior MI.  LAD mid 80% stenosis, circ mild plaque, RCA 100% with thrombectomy PTCA.  EF 65%  . Osteoporosis   . PC (prostate cancer) ([place]) 1987  . RBBB   . Rectum cancer ([place]) 1986  . Renal cell cancer ([place]) 2005     Past Surgical History:  Procedure Laterality Date  . CARDIAC CATHETERIZATION N/A 12/06/2015   Procedure: Left Heart Cath and Coronary Angiography;  Surgeon: Burnell Blanks, MD;  Location: [place] [facility];  Service: Cardiovascular;  Laterality: N/A;  . COLONOSCOPY  01/08/2012   Procedure: COLONOSCOPY;  Surgeon: Rogene Houston, MD;  [facility];  Service: Endoscopy;  Laterality: N/A;  1030  . COLONOSCOPY N/A 07/20/2015   Procedure: COLONOSCOPY;  Surgeon: Rogene Houston, MD;  [facility];  Service: Endoscopy;  Laterality: N/A;  930 - moved to 9/29 @ 2:00  . COLOSTOMY Left 1986   colon cancer  . CORONARY ARTERY BYPASS GRAFT N/A 12/08/2015   Procedure: CORONARY ARTERY BYPASS GRAFTING (CABG);  Surgeon: [place] Nakayama, MD;  Location: [facility];  Service: Open Heart Surgery;   Laterality: N/A;  Times 3 using left internal mammary artery and endoscopycally harvested bilateral saphenous vein.  Marland Kitchen HERNIA REPAIR     2007 RIGHT INGUINAL HERNIA  . KIDNEY SURGERY Right 2004  . LEFT HEART CATHETERIZATION WITH CORONARY ANGIOGRAM N/A 03/12/2013   Procedure: STEMI;  Surgeon: Burn[place] Blanks, MD;  Location: [facility] CATH LAB;  Service: Cardiovascular;  Laterality: N/A;  . PERCUTANEOUS CORONARY INTERVENTION-BALLOON ONLY    . PROSTATECTOMY    . [place]  2006  . REMOVAL OF PENILE PROSTHESIS N/A 12/15/2017   Procedure: REMOVAL OF PENILE PROSTHESIS;  Surgeon: Franchot Gallo, MD;  [facility];  Service: Urology;  Laterality: N/A;  . REMOVAL OF PENILE PROSTHESIS N/A 02/23/2018   Procedure: EXPLANT OF ERODED SPHINCTER;  Surgeon: Franchot Gallo, MD;  [facility];  Service: Urology;  Laterality: N/A;  . renal sphincter appliance  2007  . TEE WITHOUT CARDIOVERSION N/A 12/08/2015   Procedure: TRANSESOPHAGEAL ECHOCARDIOGRAM (TEE);  Surgeon: [place] Nakayama, MD;  Location: [place];  Service: Open Heart Surgery;  Laterality: N/A;    Social History   Socioeconomic History  . Marital status: Married    Spouse name: Mikle Bosworth  . Number of children: Not on file  . Years of education: Not on file  . Highest education level: Not on file  Occupational History  . Occupation: pharmacist-retired  . Occupation: preacher-retired  Social Needs  . Financial resource strain: Not on file  . Food insecurity:    Worry: Not on file    Inability: Not on file  . Transportation needs:    Medical: Not on file    Non-medical: Not on file  Tobacco Use  . Smoking status: Former Smoker    Packs/day: 0.50    Years: 10.00    Pack years: 5.00    Types: Cigarettes    Start date: 10/21/1946    Last attempt to quit: 10/21/1968    Years since quitting: 50.3  . Smokeless tobacco: Never Used  Substance and Sexual Activity  . Alcohol use: Yes    Alcohol/week: 1.0  standard drinks    Types: 1 Standard drinks or equivalent per week    Frequency: Never    Comment: RARE  . Drug use: No  . Sexual activity: Not on file  Lifestyle  . Physical activity:    Days per week: Not on file    Minutes per session: Not on file  . Stress: Not on file  Relationships  . Social connections:    Talks on phone: Not on file    Gets together: Not on file    Attends religious service: Not on file    Active member of club or organization: Not on file    Attends meetings of clubs or organizations: Not on file    Relationship status: Not on file  Other Topics Concern  . Not on file  Social History Narrative  . Not on file     Family History  Problem Relation Age of Onset  . Stroke Mother 79  . AAA (abdominal aortic aneurysm) Father 20       died from rupture at 11 yo  . Arthritis Father   . Parkinson's disease Sister   . Cancer Son        prostate cancer  . Hypertension Son       Labs/Other Tests and Data Reviewed:    Wt Readings from Last 3 Encounters:  09/09/18 163 lb 6.4 oz (74.1 kg)  09/03/18 163 lb (73.9 kg)  08/20/18 164 lb (74.4 kg)   Temp Readings from Last 3 Encounters:  09/03/18 (!) 96.7 F (35.9 C) (Oral)  08/20/18 (!) 97 F (36.1 C) (Oral)  06/07/18 (!) 97.5 F (36.4 C)   BP Readings from Last 3 Encounters:  09/09/18 98/61  09/03/18 122/76  08/20/18 133/89   Pulse Readings from Last 3 Encounters:  09/09/18 63  09/03/18 61  08/20/18 68     Lab Results  Component Value Date   HGBA1C 5.7 (H) 12/07/2015   HGBA1C 5.7 (H) 12/07/2015   Lab Results  Component Value Date   LDLCALC 71 09/02/2018  CREATININE 1.59 (H) 09/02/2018       Chemistry      Component Value Date/Time   NA 141 09/02/2018 0909   K 4.8 09/02/2018 0909   CL 104 09/02/2018 0909   CO2 22 09/02/2018 0909   BUN 32 09/02/2018 0909   CREATININE 1.59 (H) 09/02/2018 0909   CREATININE 1.40 (H) 05/27/2013 0818   GLU 82 10/02/2012      Component Value  Date/Time   CALCIUM 9.9 09/02/2018 0909   ALKPHOS 128 (H) 09/02/2018 0909   AST 23 09/02/2018 0909   ALT 13 09/02/2018 0909   BILITOT 0.6 09/02/2018 0909         OBSERVATIONS/ OBJECTIVE:     The patient is alert and relayed all of his medical information to me.  He says that his blood pressure at home typically runs in the 115 range over the 80s.  This morning it was 137/75.  His weight is stable and slightly decreased at 157.  He is positive and responds quickly to questions asked of him.  He understands when his appointments are and is excited about getting the surgery for his bladder incontinence.  Physical exam deferred due to nature of telephonic visit.  ASSESSMENT & PLAN    Time:   Today, I have spent 25 minutes with the patient via telephone discussing the above including Covid precautions.     Visit Diagnoses: 1. Essential hypertension, benign -Blood pressures are good and he will continue with current treatment  2. Vitamin D deficiency -Continue with vitamin D replacement pending results of lab work  3. CKD (chronic kidney disease) stage 3, GFR 30-59 ml/min (HCC) -No signs of any edema or swelling and he will continue with his current blood pressure treatment and sodium restriction  4. Renal cell carcinoma, right (HCC) -Continue to follow-up with urology  5. Urinary incontinence, unspecified type -Follow-up with urology as planned for surgery in June  6. Prostate cancer [facility]) -Follow-up with urology as planned  7. ASCVD (arteriosclerotic cardiovascular disease) -Follow-up with cardiology as planned and continue to pursue as aggressive therapeutic lifestyle changes as possible including diet and exercise  8. Mixed hyperlipidemia -Follow aggressive therapeutic lifestyle changes  9. Peripheral vascular insufficiency (HCC) -Exercise regularly  10. Colostomy in place [facility]) -No problems with colostomy  11. S/P CABG x 3 -Follow-up with Dr. Percival Spanish in May or  [place] as planned  No refills are needed on any medication. Arrange to come in for lab work with my nurse  Patient Instructions  Stay active physically and walk regularly Continue to be careful and not put self at risk for falling Follow-up with cardiology as planned Follow-up with urology as planned Drink plenty of water and stay well-hydrated Practice good respiratory and hand hygiene because of the [place] pandemic Make sure that regular follow-ups with specialist occur     The above assessment and management plan was discussed with the patient. The patient verbalized understanding of and has agreed to the management plan. Patient is aware to call the clinic if symptoms persist or worsen. Patient is aware when to return to the clinic for a follow-up visit. Patient educated on when it is appropriate to go to the emergency department.    Chipper Herb, MD [place], [place], [place] [address] Ph [phone]   Arrie Senate MD

## 2019-02-18 NOTE — Patient Instructions (Signed)
Stay active physically and walk regularly Continue to be careful and not put self at risk for falling Follow-up with cardiology as planned Follow-up with urology as planned Drink plenty of water and stay well-hydrated Practice good respiratory and hand hygiene because of the [place] pandemic Make sure that regular follow-ups with specialist occur

## 2019-02-18 NOTE — Addendum Note (Signed)
Addended by: Zannie Cove on: 02/18/2019 11:42 AM   Modules accepted: Orders

## 2019-02-20 ENCOUNTER — Other Ambulatory Visit: Payer: Self-pay | Admitting: Family Medicine

## 2019-03-22 ENCOUNTER — Telehealth: Payer: Self-pay | Admitting: Cardiology

## 2019-03-22 NOTE — Telephone Encounter (Signed)
Follow up ° ° °Patient is returning your call. Please call. ° ° ° °

## 2019-03-22 NOTE — Telephone Encounter (Signed)
I spoke to patient and he affirms he has been doing very well (he jokes that he doesn't want to jinx it). He still exercises about 15-20 mins a day without any angina or dyspnea. Surgery is for urinary leaking.  Surgical clearance below is somewhat vague, asking [place] if he can hold blood thinner and for how long.  The patient indicates his pre-op paperwork told him to hold aspirin for 10 days. In the past, Dr. Percival Spanish has cleared him to hold for 7 days but only if felt completely necessary.  I will route to Dr. Percival Spanish for input about holding x 10 days instead. Dr. Percival Spanish - Please route response to P CV DIV PREOP (the pre-op pool). Thank you. Otherwise I feel he can proceed as planned. We did discuss increased baseline risk due to age/heart history but overall RCRI (risk of adverse cardiac events) is low at 0.9% otherwise. Hope to have this answer by this PM since he goes for pre-op appointment tomorrow AM.  Melina Copa PA-C

## 2019-03-22 NOTE — Telephone Encounter (Signed)
   Primary Cardiologist: Minus Breeding, MD  Chart reviewed as part of pre-operative protocol coverage. Patient was contacted 03/22/2019 in reference to pre-operative risk assessment for pending surgery as outlined below.  Delyle Weider [facility] was last seen on 09/09/18 by Dr. Percival Spanish. He has h/o MI 2014 with thrombectomy of RCA occlusion, CABGx3 in 2017, PVCs, mild-mod AS by imaging 05/2017 with mild MR, arthritis, anemia, CKD III, solitary kidney, esophageal stricture, HTN, hypothyroidism, rectal cancer, renal cell cancer.   Reached out to pt to find out how he has been doing, got VM. LMOM to call us back.  Dr. Percival Spanish previously made commentary on ASA in clearance earlier this year which we can reference when finalizing clearance.   Charlie Pitter, PA-C 03/22/2019, 2:07 PM

## 2019-03-22 NOTE — Telephone Encounter (Signed)
° °  [facility] Pre-operative Risk Assessment    Request for surgical clearance:  1. What type of surgery is being performed? Artificial  Urinary Sphincter Place ment / Possible Transcorpal  2. When is this surgery scheduled? 04-06-19   3. What type of clearance is required (medical clearance vs. Pharmacy clearance to hold med vs. Both)? Pharmacy  4. Are there any medications that need to be held prior to surgery and how long? Can pt stop his blood thinner? If so, how long?   5. Practice name and name of physician performing surgery? Dr Thurmond Butts Terlickii   6. What is your office phone number [phone]    7.   What is your office fax number- [phone]  8.   Anesthesia type (None, local, MAC, general) ?Not sure   Frederick Davis 03/22/2019, 11:31 AM  _________________________________________________________________   (provider comments below)

## 2019-03-23 DIAGNOSIS — Z951 Presence of aortocoronary bypass graft: Secondary | ICD-10-CM | POA: Diagnosis not present

## 2019-03-23 DIAGNOSIS — N183 Chronic kidney disease, stage 3 (moderate): Secondary | ICD-10-CM | POA: Diagnosis not present

## 2019-03-23 DIAGNOSIS — I371 Nonrheumatic pulmonary valve insufficiency: Secondary | ICD-10-CM | POA: Diagnosis not present

## 2019-03-23 DIAGNOSIS — I350 Nonrheumatic aortic (valve) stenosis: Secondary | ICD-10-CM | POA: Diagnosis not present

## 2019-03-23 DIAGNOSIS — I2510 Atherosclerotic heart disease of native coronary artery without angina pectoris: Secondary | ICD-10-CM | POA: Diagnosis not present

## 2019-03-23 DIAGNOSIS — I272 Pulmonary hypertension, unspecified: Secondary | ICD-10-CM | POA: Diagnosis not present

## 2019-03-23 DIAGNOSIS — I129 Hypertensive chronic kidney disease with stage 1 through stage 4 chronic kidney disease, or unspecified chronic kidney disease: Secondary | ICD-10-CM | POA: Diagnosis not present

## 2019-03-23 DIAGNOSIS — D631 Anemia in chronic kidney disease: Secondary | ICD-10-CM | POA: Diagnosis not present

## 2019-03-23 DIAGNOSIS — Z8546 Personal history of malignant neoplasm of prostate: Secondary | ICD-10-CM | POA: Diagnosis not present

## 2019-03-23 DIAGNOSIS — Z87891 Personal history of nicotine dependence: Secondary | ICD-10-CM | POA: Diagnosis not present

## 2019-03-23 DIAGNOSIS — E039 Hypothyroidism, unspecified: Secondary | ICD-10-CM | POA: Diagnosis not present

## 2019-03-23 DIAGNOSIS — Z8553 Personal history of malignant neoplasm of renal pelvis: Secondary | ICD-10-CM | POA: Diagnosis not present

## 2019-03-23 DIAGNOSIS — N393 Stress incontinence (female) (male): Secondary | ICD-10-CM | POA: Diagnosis not present

## 2019-03-23 DIAGNOSIS — Z01818 Encounter for other preprocedural examination: Secondary | ICD-10-CM | POA: Diagnosis not present

## 2019-03-24 ENCOUNTER — Other Ambulatory Visit: Payer: Self-pay

## 2019-03-24 ENCOUNTER — Telehealth: Payer: Self-pay | Admitting: Family Medicine

## 2019-03-24 ENCOUNTER — Other Ambulatory Visit: Payer: Self-pay | Admitting: *Deleted

## 2019-03-24 ENCOUNTER — Other Ambulatory Visit: Payer: Medicare Other

## 2019-03-24 DIAGNOSIS — E559 Vitamin D deficiency, unspecified: Secondary | ICD-10-CM

## 2019-03-24 DIAGNOSIS — N183 Chronic kidney disease, stage 3 unspecified: Secondary | ICD-10-CM

## 2019-03-24 DIAGNOSIS — Z951 Presence of aortocoronary bypass graft: Secondary | ICD-10-CM

## 2019-03-24 DIAGNOSIS — C61 Malignant neoplasm of prostate: Secondary | ICD-10-CM

## 2019-03-24 DIAGNOSIS — E782 Mixed hyperlipidemia: Secondary | ICD-10-CM | POA: Diagnosis not present

## 2019-03-24 DIAGNOSIS — I739 Peripheral vascular disease, unspecified: Secondary | ICD-10-CM

## 2019-03-24 DIAGNOSIS — Z933 Colostomy status: Secondary | ICD-10-CM | POA: Diagnosis not present

## 2019-03-24 DIAGNOSIS — I2510 Atherosclerotic heart disease of native coronary artery without angina pectoris: Secondary | ICD-10-CM | POA: Diagnosis not present

## 2019-03-24 DIAGNOSIS — I10 Essential (primary) hypertension: Secondary | ICD-10-CM | POA: Diagnosis not present

## 2019-03-24 DIAGNOSIS — C641 Malignant neoplasm of right kidney, except renal pelvis: Secondary | ICD-10-CM | POA: Diagnosis not present

## 2019-03-24 DIAGNOSIS — R32 Unspecified urinary incontinence: Secondary | ICD-10-CM

## 2019-03-24 LAB — URINALYSIS, COMPLETE
Bilirubin, UA: NEGATIVE
Glucose, UA: NEGATIVE
Ketones, UA: NEGATIVE
Leukocytes,UA: NEGATIVE
Nitrite, UA: NEGATIVE
Protein,UA: NEGATIVE
RBC, UA: NEGATIVE
Specific Gravity, UA: 1.02 (ref 1.005–1.030)
Urobilinogen, Ur: 0.2 mg/dL (ref 0.2–1.0)
pH, UA: 5 (ref 5.0–7.5)

## 2019-03-24 LAB — MICROSCOPIC EXAMINATION
Bacteria, UA: NONE SEEN
Epithelial Cells (non renal): NONE SEEN /hpf (ref 0–10)
RBC, Urine: NONE SEEN /hpf (ref 0–2)
Renal Epithel, UA: NONE SEEN /hpf
WBC, UA: NONE SEEN /hpf (ref 0–5)

## 2019-03-24 NOTE — Telephone Encounter (Signed)
Efaxed to requesting provider.  

## 2019-03-24 NOTE — Telephone Encounter (Signed)
OK to hold ASA for 7 days only if the bleeding risk on ASA is too high to proceed with the procedure.  No need to hold for 10 days.

## 2019-03-24 NOTE — Telephone Encounter (Signed)
?   Do you know anything about this? I can't tell where he went yesterday

## 2019-03-24 NOTE — Telephone Encounter (Signed)
Pt called - urine order placed

## 2019-03-25 LAB — HEPATIC FUNCTION PANEL
ALT: 10 IU/L (ref 0–44)
AST: 20 IU/L (ref 0–40)
Albumin: 4.2 g/dL (ref 3.5–4.6)
Alkaline Phosphatase: 97 IU/L (ref 39–117)
Bilirubin Total: 0.3 mg/dL (ref 0.0–1.2)
Bilirubin, Direct: 0.12 mg/dL (ref 0.00–0.40)
Total Protein: 6.7 g/dL (ref 6.0–8.5)

## 2019-03-25 LAB — LIPID PANEL
Chol/HDL Ratio: 2.4 ratio (ref 0.0–5.0)
Cholesterol, Total: 155 mg/dL (ref 100–199)
HDL: 64 mg/dL (ref >39)
LDL Calculated: 61 mg/dL (ref 0–99)
Triglycerides: 149 mg/dL (ref 0–149)
VLDL Cholesterol Cal: 30 mg/dL (ref 5–40)

## 2019-03-25 LAB — BMP8+EGFR
BUN/Creatinine Ratio: 21 (ref 10–24)
BUN: 33 mg/dL (ref 10–36)
CO2: 22 mmol/L (ref 20–29)
Calcium: 9.8 mg/dL (ref 8.6–10.2)
Chloride: 102 mmol/L (ref 96–106)
Creatinine, Ser: 1.58 mg/dL — ABNORMAL HIGH (ref 0.76–1.27)
GFR calc Af Amer: 43 mL/min/{1.73_m2} — ABNORMAL LOW (ref >59)
GFR calc non Af Amer: 37 mL/min/{1.73_m2} — ABNORMAL LOW (ref >59)
Glucose: 81 mg/dL (ref 65–99)
Potassium: 5.2 mmol/L (ref 3.5–5.2)
Sodium: 140 mmol/L (ref 134–144)

## 2019-03-25 LAB — CBC WITH DIFFERENTIAL/PLATELET
Basophils Absolute: 0.1 10*3/uL (ref 0.0–0.2)
Basos: 1 %
EOS (ABSOLUTE): 0.3 10*3/uL (ref 0.0–0.4)
Eos: 4 %
Hematocrit: 38.1 % (ref 37.5–51.0)
Hemoglobin: 12.2 g/dL — ABNORMAL LOW (ref 13.0–17.7)
Immature Grans (Abs): 0 10*3/uL (ref 0.0–0.1)
Immature Granulocytes: 0 %
Lymphocytes Absolute: 2.4 10*3/uL (ref 0.7–3.1)
Lymphs: 26 %
MCH: 27.9 pg (ref 26.6–33.0)
MCHC: 32 g/dL (ref 31.5–35.7)
MCV: 87 fL (ref 79–97)
Monocytes Absolute: 0.9 10*3/uL (ref 0.1–0.9)
Monocytes: 10 %
Neutrophils Absolute: 5.4 10*3/uL (ref 1.4–7.0)
Neutrophils: 59 %
Platelets: 252 10*3/uL (ref 150–450)
RBC: 4.38 x10E6/uL (ref 4.14–5.80)
RDW: 13.8 % (ref 11.6–15.4)
WBC: 9.2 10*3/uL (ref 3.4–10.8)

## 2019-03-25 LAB — VITAMIN D 25 HYDROXY (VIT D DEFICIENCY, FRACTURES): Vit D, 25-Hydroxy: 33.5 ng/mL (ref 30.0–100.0)

## 2019-03-26 ENCOUNTER — Other Ambulatory Visit: Payer: Self-pay | Admitting: Physician Assistant

## 2019-03-26 MED ORDER — CEPHALEXIN 250 MG PO CAPS: 250.0000 mg | ORAL_CAPSULE | Freq: Two times a day (BID) | ORAL | 0 refills | Status: DC

## 2019-03-27 LAB — URINE CULTURE

## 2019-04-08 ENCOUNTER — Telehealth: Payer: Self-pay | Admitting: Family Medicine

## 2019-04-08 ENCOUNTER — Other Ambulatory Visit: Payer: Self-pay | Admitting: *Deleted

## 2019-04-08 DIAGNOSIS — N200 Calculus of kidney: Secondary | ICD-10-CM | POA: Diagnosis not present

## 2019-04-08 DIAGNOSIS — N281 Cyst of kidney, acquired: Secondary | ICD-10-CM | POA: Diagnosis not present

## 2019-04-08 DIAGNOSIS — R32 Unspecified urinary incontinence: Secondary | ICD-10-CM

## 2019-04-08 NOTE — Telephone Encounter (Signed)
Orders placed.  Wife aware

## 2019-04-08 NOTE — Telephone Encounter (Signed)
Ok to order 

## 2019-04-09 ENCOUNTER — Other Ambulatory Visit: Payer: Self-pay

## 2019-04-09 ENCOUNTER — Other Ambulatory Visit: Payer: Medicare Other

## 2019-04-09 DIAGNOSIS — R32 Unspecified urinary incontinence: Secondary | ICD-10-CM

## 2019-04-09 LAB — URINALYSIS, COMPLETE
Bilirubin, UA: NEGATIVE
Glucose, UA: NEGATIVE
Ketones, UA: NEGATIVE
Leukocytes,UA: NEGATIVE
Nitrite, UA: NEGATIVE
Protein,UA: NEGATIVE
RBC, UA: NEGATIVE
Specific Gravity, UA: 1.015 (ref 1.005–1.030)
Urobilinogen, Ur: 0.2 mg/dL (ref 0.2–1.0)
pH, UA: 5 (ref 5.0–7.5)

## 2019-04-09 LAB — MICROSCOPIC EXAMINATION
Bacteria, UA: NONE SEEN
Epithelial Cells (non renal): NONE SEEN /hpf (ref 0–10)
RBC, Urine: NONE SEEN /hpf (ref 0–2)
Renal Epithel, UA: NONE SEEN /hpf
WBC, UA: NONE SEEN /hpf (ref 0–5)

## 2019-04-10 LAB — URINE CULTURE

## 2019-04-12 ENCOUNTER — Ambulatory Visit (INDEPENDENT_AMBULATORY_CARE_PROVIDER_SITE_OTHER): Payer: Medicare Other | Admitting: *Deleted

## 2019-04-12 ENCOUNTER — Other Ambulatory Visit: Payer: Self-pay

## 2019-04-12 VITALS — Ht 64.5 in | Wt 163.4 lb

## 2019-04-12 DIAGNOSIS — Z0000 Encounter for general adult medical examination without abnormal findings: Secondary | ICD-10-CM

## 2019-04-12 NOTE — Progress Notes (Signed)
MEDICARE ANNUAL WELLNESS VISIT  04/12/2019  Telephone Visit Disclaimer This Medicare AWV was conducted by telephone due to national recommendations for restrictions regarding the COVID-19 Pandemic (e.g. social distancing).  I verified, using two identifiers, that I am speaking with Frederick Davis or their authorized healthcare agent. I discussed the limitations, risks, security, and privacy concerns of performing an evaluation and management service by telephone and the potential availability of an in-person appointment in the future. The patient expressed understanding and agreed to proceed.   Subjective:  Frederick Davis is a 85 y.o. male patient of Stacks, Cletus Gash, MD who had a Medicare Annual Wellness Visit today via telephone. Frederick Davis is Retired and lives with their spouse. he has 4 children. he reports that he is socially active and does interact with friends/family regularly. he is minimally physically active and enjoys golfing.  Patient Care Team: Claretta Fraise, MD as PCP - General (Family Medicine) Minus Breeding, MD as PCP - Cardiology (Cardiology) Burman Foster, MD as Physician Assistant (Urology) Danella Sensing, MD as Consulting Physician (Dermatology) Gaynelle Arabian, MD as Consulting Physician (Orthopedic Surgery) Rogene Houston, MD as Consulting Physician (Gastroenterology)  Advanced Directives 04/12/2019 06/07/2018 02/23/2018 02/20/2018 12/15/2017 12/15/2017 12/11/2017  Does Patient Have a Medical Advance Directive? No No Yes Yes Yes - Yes  Type of Advance Directive - - [place];Living will [place];Living will [place];Living will - [place];Living will  Does patient want to make changes to medical advance directive? - - No - Patient declined No - Patient declined No - Patient declined - No - Patient declined  Copy of [place] in Chart? - - No - copy requested No - copy  requested No - copy requested No - copy requested No - copy requested  Would patient like information on creating a medical advance directive? Yes (MAU/Ambulatory/Procedural Areas - Information given) No - Patient declined - - - - -  Pre-existing out of facility DNR order (yellow form or pink MOST form) - - - - - - -    Hospital Utilization Over the Past 12 Months: # of hospitalizations or ER visits: 0 # of surgeries: 0  Review of Systems    Patient reports that his overall health is unchanged compared to last year.  Patient Reported Readings (BP, Pulse, CBG, Weight, etc) none  Review of Systems: History obtained from chart review and the patient General ROS: negative  All other systems negative.  Pain Assessment Pain : No/denies pain     Current Medications & Allergies (verified) Allergies as of 04/12/2019      Reactions   Lipitor [atorvastatin] Other (See Comments)   Muscle aches      Medication List       Accurate as of April 12, 2019 10:00 AM. If you have any questions, ask your nurse or doctor.        acetaminophen 500 MG tablet Commonly known as: TYLENOL Take 500 mg by mouth at bedtime.   amLODipine 2.5 MG tablet Commonly known as: NORVASC TAKE 1 TABLET DAILY   aspirin 81 MG tablet Take 1 tablet (81 mg total) by mouth daily. What changed: when to take this   carvedilol 3.125 MG tablet Commonly known as: COREG TAKE  (1)  TABLET TWICE A DAY WITH MEALS (BREAKFAST AND SUPPER)   cephALEXin 250 MG capsule Commonly known as: Keflex Take 1 capsule (250 mg total) by mouth 2 (two) times  daily.   furosemide 20 MG tablet Commonly known as: LASIX Take 1 tablet daily as needed.   levothyroxine 75 MCG tablet Commonly known as: SYNTHROID TAKE 1 TABLET DAILY   MUCINEX PO Take by mouth daily.   multivitamin capsule Take 1 capsule daily by mouth.   nitroGLYCERIN 0.4 MG SL tablet Commonly known as: NITROSTAT PLACE 1 TABLET UNDER THE TONGUE AT ONSET OF CHEST  PAIN EVERY 5 MINTUES UP TO 3 TIMES AS NEEDED FOR CHEST PAIN       History (reviewed): Past Medical History:  Diagnosis Date   Anemia    Aortic stenosis    a. mild-mod by TEE 11/2015.   Arthritis    left knee    CAD (coronary artery disease)    a. RCA thrombectomy 2014. b. CABGx3 in 11/2015 (EF 45-50% in 11/2015).   Chronic renal insufficiency, stage III (moderate) (HCC)    only has 0ne kidney   Dyspnea    WITH EXERTION   Esophageal stricture YRS AGO   Hypertension    Hypothyroidism    Kidney stone 1969   MI (myocardial infarction) ([place]) 2017   Mitral regurgitation    a. mild by TEE 11/2015.   Myocardial infarction ([place]) 03/12/13   Inferior MI.  LAD mid 80% stenosis, circ mild plaque, RCA 100% with thrombectomy PTCA.  EF 65%   Osteoporosis    PC (prostate cancer) ([place]) 1987   RBBB    Rectum cancer ([place]) 1986   Renal cell cancer ([place]) 2005   Past Surgical History:  Procedure Laterality Date   CARDIAC CATHETERIZATION N/A 12/06/2015   Procedure: Left Heart Cath and Coronary Angiography;  Surgeon: Burnell Blanks, MD;  Location: [place] [facility];  Service: Cardiovascular;  Laterality: N/A;   COLONOSCOPY  01/08/2012   Procedure: COLONOSCOPY;  Surgeon: Rogene Houston, MD;  [facility];  Service: Endoscopy;  Laterality: N/A;  1030   COLONOSCOPY N/A 07/20/2015   Procedure: COLONOSCOPY;  Surgeon: Rogene Houston, MD;  [facility];  Service: Endoscopy;  Laterality: N/A;  930 - moved to 9/29 @ 2:00   COLOSTOMY Left 1986   colon cancer   CORONARY ARTERY BYPASS GRAFT N/A 12/08/2015   Procedure: CORONARY ARTERY BYPASS GRAFTING (CABG);  Surgeon: Melrose Nakayama, MD;  Location: [facility];  Service: Open Heart Surgery;  Laterality: N/A;  Times 3 using left internal mammary artery and endoscopycally harvested bilateral saphenous vein.   HERNIA REPAIR     2007 RIGHT INGUINAL HERNIA   KIDNEY SURGERY Right 2004   LEFT HEART  CATHETERIZATION WITH CORONARY ANGIOGRAM N/A 03/12/2013   Procedure: STEMI;  Surgeon: Burnell Blanks, MD;  [facility]: [facility];  Service: Cardiovascular;  Laterality: N/A;   PERCUTANEOUS CORONARY INTERVENTION-[place] ONLY     PROSTATECTOMY     RADIOFREQUENCY ABLATION KIDNEY  2006   REMOVAL OF PENILE PROSTHESIS N/A 12/15/2017   Procedure: REMOVAL OF PENILE PROSTHESIS;  Surgeon: Franchot Gallo, MD;  Location: [facility];  Service: Urology;  Laterality: N/A;   REMOVAL OF PENILE PROSTHESIS N/A 02/23/2018   Procedure: [name] OF ERODED SPHINCTER;  Surgeon: Franchot Gallo, MD;  Location: [facility];  Service: Urology;  Laterality: N/A;   renal sphincter appliance  2007   TEE WITHOUT CARDIOVERSION N/A 12/08/2015   Procedure: TRANSESOPHAGEAL ECHOCARDIOGRAM (TEE);  Surgeon: Melrose Nakayama, MD;  Location: [facility];  Service: Open Heart Surgery;  Laterality: N/A;   Family History  Problem Relation Age of Onset  Stroke Mother 42   AAA (abdominal aortic aneurysm) Father 40       died from rupture at 68 yo   Arthritis Father    Parkinson's disease Sister    Cancer Son        prostate cancer   Hypertension Son    Social History   Socioeconomic History   Marital status: Married    Spouse name: Mikle Bosworth   Number of children: Not on file   Years of education: Not on file   Highest education level: Professional school degree (e.g., MD, DDS, DVM, JD)  Occupational History   Occupation: pharmacist-retired   Occupation: preacher-retired  Social Designer, fashion/clothing strain: Not hard at all   Food insecurity    Worry: Never true    Inability: Never true   Transportation needs    Medical: No    Non-medical: No  Tobacco Use   Smoking status: Former Smoker    Packs/day: 0.50    Years: 10.00    Pack years: 5.00    Types: Cigarettes    Start date: 10/21/1946    Quit date: 10/21/1968    Years since quitting: 50.5   Smokeless tobacco: Never Used  Substance and  Sexual Activity   Alcohol use: Yes    Alcohol/week: 1.0 standard drinks    Types: 1 Standard drinks or equivalent per week    Frequency: Never    Comment: RARE   Drug use: No   Sexual activity: Not Currently  Lifestyle   Physical activity    Days per week: 4 days    Minutes per session: 20 min   Stress: Not at all  Relationships   Social connections    Talks on phone: More than three times a week    Gets together: More than three times a week    Attends religious service: More than 4 times per year    Active member of club or organization: Yes    Attends meetings of clubs or organizations: More than 4 times per year    Relationship status: Married  Other Topics Concern   Not on file  Social History Narrative   Not on file    Activities of Daily Living In your present state of health, do you have any difficulty performing the following activities: 04/12/2019  Hearing? N  Vision? N  Difficulty concentrating or making decisions? Y  Walking or climbing stairs? Y  Dressing or bathing? N  Doing errands, shopping? N  Preparing Food and eating ? N  Using the Toilet? N  In the past six months, have you accidently leaked urine? N  Do you have problems with loss of bowel control? N  Managing your Medications? N  Managing your Finances? N  Housekeeping or managing your Housekeeping? N  Some recent data might be hidden    Patient Literacy How often do you need to have someone help you when you read instructions, pamphlets, or other written materials from your doctor or pharmacy?: 1 - Never  Exercise    Diet Patient reports consuming 2 meals a day and 1 snack(s) a day Patient reports that his primary diet is: Regular Patient reports that she does have regular access to food.   Depression Screen PHQ 2/9 Scores 04/12/2019 09/03/2018 08/20/2018 04/20/2018 01/14/2018 12/26/2017 12/10/2017  PHQ - 2 Score 0 0 0 0 0 0 0  PHQ- 9 Score - - - - - - -     Fall Risk Fall Risk  04/12/2019 09/03/2018 08/20/2018 04/20/2018 01/14/2018  Falls in the past year? 0 0 No No No  Number falls in past yr: 0 - - - -  Injury with Fall? 0 - - - -  Risk for fall due to : - - - - -  Follow up - - - - -     Objective:  Frederick Davis seemed alert and oriented and he participated appropriately during our telephone visit.  Blood Pressure Weight BMI  BP Readings from Last 3 Encounters:  09/09/18 98/61  09/03/18 122/76  08/20/18 133/89   Wt Readings from Last 3 Encounters:  04/12/19 163 lb 6.4 oz (74.1 kg)  09/09/18 163 lb 6.4 oz (74.1 kg)  09/03/18 163 lb (73.9 kg)   BMI Readings from Last 1 Encounters:  04/12/19 27.61 kg/m    *Unable to obtain current vital signs, weight, and BMI due to telephone visit type  Hearing/Vision   Joneen Boers did not seem to have difficulty with hearing/understanding during the telephone conversation  Reports that he has not had a formal eye exam by an eye care professional within the past year  Reports that he has not had a formal hearing evaluation within the past year *Unable to fully assess hearing and vision during telephone visit type  Cognitive Function: 6CIT Screen 04/12/2019  What Year? 0 points  What month? 0 points  What time? 0 points  Count back from 20 0 points  Months in reverse 0 points  Repeat phrase 0 points  Total Score 0   (Normal:0-7, Significant for Dysfunction: >8)  Normal Cognitive Function Screening: Yes   Immunization & Health Maintenance Record Immunization History  Administered Date(s) Administered   Influenza Whole 08/13/2012   Influenza, High Dose Seasonal PF 08/06/2017, 08/11/2018   Influenza,inj,Quad PF,6+ Mos 08/12/2013, 08/09/2014, 09/27/2015, 07/30/2016   Pneumococcal Conjugate-13 12/28/2013   Pneumococcal Polysaccharide-23 10/21/1990   Zoster 07/08/2011    Health Maintenance  Topic Date Due   TETANUS/TDAP  08/21/2018   INFLUENZA VACCINE  05/22/2019   PNA vac Low Risk Adult   Completed       Assessment  This is a routine wellness examination for Frederick Davis.  Health Maintenance: Due or Overdue Health Maintenance Due  Topic Date Due   TETANUS/TDAP  08/21/2018    Frederick Davis does not need a referral for Community Assistance: Care Management:   no Social Work:    no Prescription Assistance:  no Nutrition/Diabetes Education:  no   Plan:  Personalized Goals Goals Addressed   None    Personalized Health Maintenance & Screening Recommendations  Td vaccine Shingrix  Lung Cancer Screening Recommended: no (Low Dose CT Chest recommended if Age 30-80 years, 30 pack-year currently smoking OR have quit w/in past 15 years) Hepatitis C Screening recommended: no HIV Screening recommended: no  Advanced Directives: Written information was prepared per patient's request.  Referrals & Orders No orders of the defined types were placed in this encounter.   Follow-up Plan  Follow-up with Claretta Fraise, MD as planned   I have personally reviewed and noted the following in the patients chart:    Medical and social history  Use of alcohol, tobacco or illicit drugs   Current medications and supplements  Functional ability and status  Nutritional status  Physical activity  Advanced directives  List of other physicians  Hospitalizations, surgeries, and ER visits in previous 12 months  Vitals  Screenings to include cognitive, depression, and falls  Referrals and appointments  In addition, I have reviewed and discussed with Frederick Davis certain preventive protocols, quality metrics, and best practice recommendations. A written personalized care plan for preventive services as well as general preventive health recommendations is available and can be mailed to the patient at his request.      Wardell Heath, LPN        [id number]

## 2019-04-12 NOTE — Patient Instructions (Signed)
  Mr. Frederick Davis , Thank you for taking time to come for your Medicare Wellness Visit. I appreciate your ongoing commitment to your health goals. Please review the following plan we discussed and let me know if I can assist you in the future.   These are the goals we discussed: Goals    . Prevent Falls     And CONTINUE healthy lifestyle        This is a list of the screening recommended for you and due dates:  Health Maintenance  Topic Date Due  . Tetanus Vaccine  08/21/2018  . Flu Shot  05/22/2019  . Pneumonia vaccines  Completed

## 2019-04-13 ENCOUNTER — Telehealth: Payer: Self-pay | Admitting: Cardiology

## 2019-04-13 DIAGNOSIS — R079 Chest pain, unspecified: Secondary | ICD-10-CM | POA: Diagnosis not present

## 2019-04-13 DIAGNOSIS — R4020 Unspecified coma: Secondary | ICD-10-CM | POA: Diagnosis not present

## 2019-04-13 DIAGNOSIS — I4510 Unspecified right bundle-branch block: Secondary | ICD-10-CM | POA: Diagnosis not present

## 2019-04-13 DIAGNOSIS — R001 Bradycardia, unspecified: Secondary | ICD-10-CM | POA: Diagnosis not present

## 2019-04-13 DIAGNOSIS — R0689 Other abnormalities of breathing: Secondary | ICD-10-CM | POA: Diagnosis not present

## 2019-04-13 NOTE — Telephone Encounter (Signed)
New Message     Pts wife called and said the pt was feeling funny, He said he burped and felt like it was gas.  The pt used 2 nitroglycerin and he was still not feeling right. Pts wife called EMT and they came and said pt was doing okay so they did not transport him and that the pts wife needed to let  Dr Percival Spanish know    Please call back

## 2019-04-13 NOTE — Telephone Encounter (Signed)
Spoke to patient wife . Patient is not in any distress at present time.  Patient has not had upcoming surgery  - wanted to actually  Consult with surgeon again.   RN suggest an appointment- to make sure everything is okay prior to Surgery. Wife  Request an appointment for  beforeJuly 24( taht appt w/surgeon) , she would like the patient  to see Dr Percival Spanish ,only .  Will defer to Dr Percival Spanish to which date would be best ? Wife is aware

## 2019-04-13 NOTE — Telephone Encounter (Signed)
I think I am in the office in [place] on July 1st and he could be double booked or added in.

## 2019-04-15 NOTE — Telephone Encounter (Signed)
Patient scheduled for visit 04/21/19, wife aware of date, time, and location

## 2019-04-20 NOTE — Progress Notes (Signed)
Dictation #1 [id number]  [id number]     HPI The patient presents for followup after a acute inferior wall infarct in May of 2014. He had thrombectomy of a right coronary artery occlusion. He was initially acute respiratory failure and was cooled. However, he responded very well to therapy.   Last year he had chest pain.  He was sent for cardiac catheterization. This demonstrated severe LAD stenosis and RCA stenosis. He subsequently underwent a CABG 3 on February 2017.      He had an episode of chest discomfort on the 23rd.  He was sitting on commode having a bowel movement.  He developed a substernal discomfort.  He said it was somewhat dull.  He called EMS.  He saysays that in retrospect a couple of days before he had pulled some weeds out of his yard because he felt so good.  He otherwise has been feeling great before this and has been feeling fine since then.  He has not been having any presyncope or syncope.  He is not having any chest discomfort, neck or arm discomfort.  He has had no weight gain or edema.   Allergies  Allergen Reactions  . Lipitor [Atorvastatin] Other (See Comments)    Muscle aches   Prior to Admission medications   Medication Sig Start Date End Date Taking? Authorizing Provider  acetaminophen (TYLENOL) 500 MG tablet Take 500 mg by mouth at bedtime.   Yes [provider]  amLODipine (NORVASC) 2.5 MG tablet Take 1 tablet (2.5 mg total) by mouth daily. 04/21/17  Yes Minus Breeding, MD  aspirin 81 MG tablet Take 1 tablet (81 mg total) by mouth daily. Patient taking differently: Take 81 mg by mouth every evening.  03/18/13  Yes Barrett, Evelene Croon, PA-C  carvedilol (COREG) 3.125 MG tablet TAKE  (1)  TABLET TWICE A DAY WITH MEALS (BREAKFAST AND SUPPER) 07/24/17  Yes Chipper Herb, MD  furosemide (LASIX) 20 MG tablet Take 1 tablet daily as needed. Patient taking differently: Take one tablet by mouth once every other day 04/07/17  Yes Deovion Batrez, Jeneen Rinks, MD   levothyroxine (SYNTHROID, LEVOTHROID) 75 MCG tablet Take 75 mcg by mouth daily before breakfast.   Yes [provider]  Multiple Vitamin (MULTIVITAMIN) capsule Take 1 capsule daily by mouth.   Yes [provider]  nitroGLYCERIN (NITROSTAT) 0.4 MG SL tablet PLACE 1 TABLET UNDER THE TONGUE AT ONSET OF CHEST PAIN EVERY 5 MINTUES UP TO 3 TIMES AS NEEDED 05/30/17  Yes Chipper Herb, MD    Past Medical History:  Diagnosis Date  . Anemia   . Aortic stenosis    a. mild-mod by TEE 11/2015.  . Arthritis    left knee   . CAD (coronary artery disease)    a. RCA thrombectomy 2014. b. CABGx3 in 11/2015 (EF 45-50% in 11/2015).  . Chronic renal insufficiency, stage III (moderate) (HCC)    only has 0ne kidney  . Dyspnea    WITH EXERTION  . Esophageal stricture YRS AGO  . Hypertension   . Hypothyroidism   . Kidney stone 1969  . MI (myocardial infarction) ([place]) 2017  . Mitral regurgitation    a. mild by TEE 11/2015.  Marland Kitchen Myocardial infarction ([place]) 03/12/13   Inferior MI.  LAD mid 80% stenosis, circ mild plaque, RCA 100% with thrombectomy PTCA.  EF 65%  . Osteoporosis   . PC (prostate cancer) ([place]) 1987  . RBBB   . Rectum cancer ([place]) 1986  . Renal cell  cancer ([place]) 2005    Past Surgical History:  Procedure Laterality Date  . CARDIAC CATHETERIZATION N/A 12/06/2015   Procedure: Left Heart Cath and Coronary Angiography;  Surgeon: Burnell Blanks, MD;  Location: [place] [facility];  Service: Cardiovascular;  Laterality: N/A;  . COLONOSCOPY  01/08/2012   Procedure: COLONOSCOPY;  Surgeon: Rogene Houston, MD;  [facility];  Service: Endoscopy;  Laterality: N/A;  1030  . COLONOSCOPY N/A 07/20/2015   Procedure: COLONOSCOPY;  Surgeon: Rogene Houston, MD;  [facility];  Service: Endoscopy;  Laterality: N/A;  930 - moved to 9/29 @ 2:00  . COLOSTOMY Left 1986   colon cancer  . CORONARY ARTERY BYPASS GRAFT N/A 12/08/2015   Procedure: CORONARY ARTERY BYPASS  GRAFTING (CABG);  Surgeon: Melrose Nakayama, MD;  Location: [facility];  Service: Open Heart Surgery;  Laterality: N/A;  Times 3 using left internal mammary artery and endoscopycally harvested bilateral saphenous vein.  Marland Kitchen HERNIA REPAIR     2007 RIGHT INGUINAL HERNIA  . KIDNEY SURGERY Right 2004  . LEFT HEART CATHETERIZATION WITH CORONARY ANGIOGRAM N/A 03/12/2013   Procedure: STEMI;  Surgeon: Burnell Blanks, MD;  Location: [facility] CATH LAB;  Service: Cardiovascular;  Laterality: N/A;  . PERCUTANEOUS CORONARY INTERVENTION-BALLOON ONLY    . PROSTATECTOMY    . [place]  2006  . REMOVAL OF PENILE PROSTHESIS N/A 12/15/2017   Procedure: REMOVAL OF PENILE PROSTHESIS;  Surgeon: Franchot Gallo, MD;  [facility];  Service: Urology;  Laterality: N/A;  . REMOVAL OF PENILE PROSTHESIS N/A 02/23/2018   Procedure: EXPLANT OF ERODED SPHINCTER;  Surgeon: Franchot Gallo, MD;  [facility];  Service: Urology;  Laterality: N/A;  . renal sphincter appliance  2007  . TEE WITHOUT CARDIOVERSION N/A 12/08/2015   Procedure: TRANSESOPHAGEAL ECHOCARDIOGRAM (TEE);  Surgeon: Melrose Nakayama, MD;  Location: [place];  Service: Open Heart Surgery;  Laterality: N/A;    ROS: As stated in the HPI and negative for all other systems.  PHYSICAL EXAM BP 120/78   Pulse 72   Ht 5' 4.5" (1.638 m)   Wt 163 lb (73.9 kg)   BMI 27.55 kg/m   GENERAL:  Well appearing for his advanced age NECK:  No jugular venous distention, waveform within normal limits, carotid upstroke brisk and symmetric, no bruits, no thyromegaly LUNGS:  Clear to auscultation bilaterally CHEST:  Unremarkable HEART:  PMI not displaced or sustained,S1 and S2 within normal limits, no S3, no S4, no clicks, no rubs, 3 out of 6 apical mid peaking systolic murmur radiating slightly at aortic outflow tract, no diastolic murmurs murmurs ABD:  Flat, positive bowel sounds normal in frequency in pitch, no bruits, no rebound, no guarding,  no midline pulsatile mass, no hepatomegaly, no splenomegaly EXT:  2 plus pulses throughout, no edema, no cyanosis no clubbing   EKG: Sinus rhythm, rate 72, right bundle branch, first-degree AV block, no acute ST-T wave changes, no change from previous.  Lab Results  Component Value Date   CHOL 155 03/24/2019   TRIG 149 03/24/2019   HDL 64 03/24/2019   LDLCALC 61 03/24/2019     ASSESSMENT AND PLAN  CAD/CABG:    The patient had one episode of chest discomfort.  I do not think given this one episode without recurrence since then, symptoms before that I would subject him to further testing or change in medications.  He will let me know if this happens again.   HTN:  The blood pressure is at target. No change in medications is indicated. We will continue with therapeutic lifestyle changes (TLC).  DYSLIPIDEMIA: Lipids are as above and at target.  No change in therapy.  AORTIC STENOSIS: This has been moderate.  No change in therapy.  PREOP: The patient is to have urologic surgery and was told that he needed to stop his aspirin for 10 days.  I will be in favor him holding this for this procedure but would think 5 days would be reasonable.  No further cardiovascular testing is needed prior to this procedure.  His risk for cardiovascular complications is not as much related to his history of coronary disease or even his conduction disturbance but more to his advanced age.

## 2019-04-21 ENCOUNTER — Other Ambulatory Visit: Payer: Self-pay

## 2019-04-21 ENCOUNTER — Ambulatory Visit (INDEPENDENT_AMBULATORY_CARE_PROVIDER_SITE_OTHER): Payer: Medicare Other | Admitting: Cardiology

## 2019-04-21 ENCOUNTER — Encounter: Payer: Self-pay | Admitting: Cardiology

## 2019-04-21 VITALS — BP 120/78 | HR 72 | Ht 64.5 in | Wt 163.0 lb

## 2019-04-21 DIAGNOSIS — I350 Nonrheumatic aortic (valve) stenosis: Secondary | ICD-10-CM | POA: Diagnosis not present

## 2019-04-21 DIAGNOSIS — E782 Mixed hyperlipidemia: Secondary | ICD-10-CM | POA: Diagnosis not present

## 2019-04-21 DIAGNOSIS — I2510 Atherosclerotic heart disease of native coronary artery without angina pectoris: Secondary | ICD-10-CM | POA: Diagnosis not present

## 2019-04-21 NOTE — Patient Instructions (Signed)
Medication Instructions:  The current medical regimen is effective;  continue present plan and medications.  If you need a refill on your cardiac medications before your next appointment, please call your pharmacy.   Follow-Up: Follow up in 6 months with Dr. Hochrein.  You will receive a letter in the mail 2 months before you are due.  Please call us when you receive this letter to schedule your follow up appointment.  Thank you for choosing [facility]!!      

## 2019-05-10 DIAGNOSIS — L570 Actinic keratosis: Secondary | ICD-10-CM | POA: Diagnosis not present

## 2019-05-10 DIAGNOSIS — D225 Melanocytic nevi of trunk: Secondary | ICD-10-CM | POA: Diagnosis not present

## 2019-05-10 DIAGNOSIS — Z85828 Personal history of other malignant neoplasm of skin: Secondary | ICD-10-CM | POA: Diagnosis not present

## 2019-05-10 DIAGNOSIS — D0472 Carcinoma in situ of skin of left lower limb, including hip: Secondary | ICD-10-CM | POA: Diagnosis not present

## 2019-05-10 DIAGNOSIS — D2262 Melanocytic nevi of left upper limb, including shoulder: Secondary | ICD-10-CM | POA: Diagnosis not present

## 2019-05-14 DIAGNOSIS — Z87442 Personal history of urinary calculi: Secondary | ICD-10-CM | POA: Diagnosis not present

## 2019-05-14 DIAGNOSIS — N289 Disorder of kidney and ureter, unspecified: Secondary | ICD-10-CM | POA: Diagnosis not present

## 2019-05-14 DIAGNOSIS — N393 Stress incontinence (female) (male): Secondary | ICD-10-CM | POA: Diagnosis not present

## 2019-05-14 DIAGNOSIS — N5231 Erectile dysfunction following radical prostatectomy: Secondary | ICD-10-CM | POA: Diagnosis not present

## 2019-05-14 DIAGNOSIS — Z8546 Personal history of malignant neoplasm of prostate: Secondary | ICD-10-CM | POA: Diagnosis not present

## 2019-05-14 DIAGNOSIS — N183 Chronic kidney disease, stage 3 (moderate): Secondary | ICD-10-CM | POA: Diagnosis not present

## 2019-05-14 DIAGNOSIS — Z905 Acquired absence of kidney: Secondary | ICD-10-CM | POA: Diagnosis not present

## 2019-05-14 DIAGNOSIS — Z85528 Personal history of other malignant neoplasm of kidney: Secondary | ICD-10-CM | POA: Diagnosis not present

## 2019-05-17 ENCOUNTER — Telehealth: Payer: Self-pay | Admitting: Family Medicine

## 2019-05-17 NOTE — Telephone Encounter (Signed)
televisit made for tomorrow °

## 2019-05-17 NOTE — Telephone Encounter (Signed)
What symptoms do you have? Diarrhea  How long have you been sick? 4 days  Have you been seen for this problem? No   If your provider decides to give you a prescription, which pharmacy would you like for it to be sent to? [place] [facility]   Patient informed that this information will be sent to the clinical staff for review and that they should receive a follow up call.

## 2019-05-18 ENCOUNTER — Ambulatory Visit (INDEPENDENT_AMBULATORY_CARE_PROVIDER_SITE_OTHER): Payer: Medicare Other | Admitting: Family Medicine

## 2019-05-18 ENCOUNTER — Other Ambulatory Visit: Payer: Self-pay

## 2019-05-18 ENCOUNTER — Other Ambulatory Visit: Payer: Medicare Other

## 2019-05-18 ENCOUNTER — Encounter: Payer: Self-pay | Admitting: Family Medicine

## 2019-05-18 DIAGNOSIS — Z20822 Contact with and (suspected) exposure to covid-19: Secondary | ICD-10-CM

## 2019-05-18 DIAGNOSIS — R197 Diarrhea, unspecified: Secondary | ICD-10-CM

## 2019-05-18 DIAGNOSIS — Z933 Colostomy status: Secondary | ICD-10-CM | POA: Diagnosis not present

## 2019-05-18 DIAGNOSIS — R6889 Other general symptoms and signs: Secondary | ICD-10-CM | POA: Diagnosis not present

## 2019-05-19 ENCOUNTER — Other Ambulatory Visit: Payer: Medicare Other

## 2019-05-19 DIAGNOSIS — R197 Diarrhea, unspecified: Secondary | ICD-10-CM

## 2019-05-19 NOTE — Progress Notes (Signed)
Virtual Visit via telephone Note Due to COVID-19 pandemic this visit was conducted virtually. This visit type was conducted due to national recommendations for restrictions regarding the COVID-19 Pandemic (e.g. social distancing, sheltering in place) in an effort to limit this patient's exposure and mitigate transmission in our community. All issues noted in this document were discussed and addressed.  A physical exam was not performed with this format.   I connected with Frederick Davis on 05/19/19 at 1100 by telephone and verified that I am speaking with the correct person using two identifiers. Frederick Davis is currently located at home and family is currently with them during visit. The provider, Monia Pouch, FNP is located in their office at time of visit.  I discussed the limitations, risks, security and privacy concerns of performing an evaluation and management service by telephone and the availability of in person appointments. I also discussed with the patient that there may be a patient responsible charge related to this service. The patient expressed understanding and agreed to proceed.  Subjective:  Patient ID: Frederick Davis, male    DOB: [birth year], 85 y.o.   MRN: [id number]  Chief Complaint:  Diarrhea   HPI: Frederick Davis is a 85 y.o. male presenting on 05/18/2019 for Diarrhea   Pt reports ongoing diarrhea for 2 days. States he has a colostomy and his bag is filled at least 3 times per day with liquid stool. He denies fever, chills, weakness, chest pain, congestion, shortness of breath, blood in stool, palpitations, pallor, dizziness, or syncope. No know sick contacts. No travel. No recent antibiotic use.   Diarrhea  This is a new problem. The current episode started in the past 7 days. The problem occurs 2 to 4 times per day. The problem has been gradually improving. The stool consistency is described as watery. The patient states that diarrhea does not awaken him  from sleep. Pertinent negatives include no abdominal pain, arthralgias, bloating, chills, coughing, fever, headaches, increased  flatus, myalgias, sweats, URI, vomiting or weight loss. He has tried anti-motility drug for the symptoms. The treatment provided mild relief.     Relevant past medical, surgical, family, and social history reviewed and updated as indicated.  Allergies and medications reviewed and updated.   Past Medical History:  Diagnosis Date  . Anemia   . Aortic stenosis    a. mild-mod by TEE 11/2015.  . Arthritis    left knee   . CAD (coronary artery disease)    a. RCA thrombectomy 2014. b. CABGx3 in 11/2015 (EF 45-50% in 11/2015).  . Chronic renal insufficiency, stage III (moderate) (HCC)    only has 0ne kidney  . Dyspnea    WITH EXERTION  . Esophageal stricture YRS AGO  . Hypertension   . Hypothyroidism   . Kidney stone 1969  . MI (myocardial infarction) ([place]) 2017  . Mitral regurgitation    a. mild by TEE 11/2015.  Marland Kitchen Myocardial infarction ([place]) 03/12/13   Inferior MI.  LAD mid 80% stenosis, circ mild plaque, RCA 100% with thrombectomy PTCA.  EF 65%  . Osteoporosis   . PC (prostate cancer) ([place]) 1987  . RBBB   . Rectum cancer ([place]) 1986  . Renal cell cancer ([place]) 2005    Past Surgical History:  Procedure Laterality Date  . CARDIAC CATHETERIZATION N/A 12/06/2015   Procedure: Left Heart Cath and Coronary Angiography;  Surgeon: Burnell Blanks, MD;  Location: [place] [facility];  Service: Cardiovascular;  Laterality:  N/A;  . COLONOSCOPY  01/08/2012   Procedure: COLONOSCOPY;  Surgeon: Rogene Houston, MD;  [facility];  Service: Endoscopy;  Laterality: N/A;  1030  . COLONOSCOPY N/A 07/20/2015   Procedure: COLONOSCOPY;  Surgeon: Rogene Houston, MD;  [facility];  Service: Endoscopy;  Laterality: N/A;  930 - moved to 9/29 @ 2:00  . COLOSTOMY Left 1986   colon cancer  . CORONARY ARTERY BYPASS GRAFT N/A 12/08/2015   Procedure: CORONARY  ARTERY BYPASS GRAFTING (CABG);  Surgeon: Melrose Nakayama, MD;  Location: [facility];  Service: Open Heart Surgery;  Laterality: N/A;  Times 3 using left internal mammary artery and endoscopycally harvested bilateral saphenous vein.  Marland Kitchen HERNIA REPAIR     2007 RIGHT INGUINAL HERNIA  . KIDNEY SURGERY Right 2004  . LEFT HEART CATHETERIZATION WITH CORONARY ANGIOGRAM N/A 03/12/2013   Procedure: STEMI;  Surgeon: Burnell Blanks, MD;  Location: [facility] CATH LAB;  Service: Cardiovascular;  Laterality: N/A;  . PERCUTANEOUS CORONARY INTERVENTION-BALLOON ONLY    . PROSTATECTOMY    . [place]  2006  . REMOVAL OF PENILE PROSTHESIS N/A 12/15/2017   Procedure: REMOVAL OF PENILE PROSTHESIS;  Surgeon: Franchot Gallo, MD;  [facility];  Service: Urology;  Laterality: N/A;  . REMOVAL OF PENILE PROSTHESIS N/A 02/23/2018   Procedure: EXPLANT OF ERODED SPHINCTER;  Surgeon: Franchot Gallo, MD;  [facility];  Service: Urology;  Laterality: N/A;  . renal sphincter appliance  2007  . TEE WITHOUT CARDIOVERSION N/A 12/08/2015   Procedure: TRANSESOPHAGEAL ECHOCARDIOGRAM (TEE);  Surgeon: Melrose Nakayama, MD;  Location: [facility];  Service: Open Heart Surgery;  Laterality: N/A;    Social History   Socioeconomic History  . Marital status: Married    Spouse name: Mikle Bosworth  . Number of children: Not on file  . Years of education: Not on file  . Highest education level: Professional school degree (e.g., MD, DDS, DVM, JD)  Occupational History  . Occupation: pharmacist-retired  . Occupation: preacher-retired  Social Needs  . Financial resource strain: Not hard at all  . Food insecurity    Worry: Never true    Inability: Never true  . Transportation needs    Medical: No    Non-medical: No  Tobacco Use  . Smoking status: Former Smoker    Packs/day: 0.50    Years: 10.00    Pack years: 5.00    Types: Cigarettes    Start date: 10/21/1946    Quit date: 10/21/1968    Years since  quitting: 50.6  . Smokeless tobacco: Never Used  Substance and Sexual Activity  . Alcohol use: Yes    Alcohol/week: 1.0 standard drinks    Types: 1 Standard drinks or equivalent per week    Frequency: Never    Comment: RARE  . Drug use: No  . Sexual activity: Not Currently  Lifestyle  . Physical activity    Days per week: 4 days    Minutes per session: 20 min  . Stress: Not at all  Relationships  . Social connections    Talks on phone: More than three times a week    Gets together: More than three times a week    Attends religious service: More than 4 times per year    Active member of club or organization: Yes    Attends meetings of clubs or organizations: More than 4 times per year    Relationship status: Married  . Intimate partner violence  Fear of current or ex partner: No    Emotionally abused: No    Physically abused: No    Forced sexual activity: No  Other Topics Concern  . Not on file  Social History Narrative  . Not on file    Outpatient Encounter Medications as of 05/18/2019  Medication Sig  . acetaminophen (TYLENOL) 500 MG tablet Take 500 mg by mouth at bedtime.  Marland Kitchen aspirin 81 MG tablet Take 1 tablet (81 mg total) by mouth daily. (Patient taking differently: Take 81 mg by mouth at bedtime. )  . carvedilol (COREG) 3.125 MG tablet TAKE  (1)  TABLET TWICE A DAY WITH MEALS (BREAKFAST AND SUPPER)  . furosemide (LASIX) 20 MG tablet Take 1 tablet daily as needed.  Marland Kitchen guaiFENesin (MUCINEX PO) Take by mouth daily as needed.   Marland Kitchen levothyroxine (SYNTHROID, LEVOTHROID) 75 MCG tablet TAKE 1 TABLET DAILY  . Multiple Vitamin (MULTIVITAMIN) capsule Take 1 capsule daily by mouth.  . nitroGLYCERIN (NITROSTAT) 0.4 MG SL tablet PLACE 1 TABLET UNDER THE TONGUE AT ONSET OF CHEST PAIN EVERY 5 MINTUES UP TO 3 TIMES AS NEEDED FOR CHEST PAIN   Facility-Administered Encounter Medications as of 05/18/2019  Medication  . etomidate (AMIDATE) injection  . lidocaine (cardiac) 100 mg/8ml  (XYLOCAINE) 20 MG/ML injection 2%  . succinylcholine (ANECTINE) injection    Allergies  Allergen Reactions  . Lipitor [Atorvastatin] Other (See Comments)    Muscle aches    Review of Systems  Constitutional: Negative for activity change, appetite change, chills, diaphoresis, fatigue, fever, unexpected weight change and weight loss.  Respiratory: Negative for cough and shortness of breath.   Cardiovascular: Negative for chest pain, palpitations and leg swelling.  Gastrointestinal: Positive for diarrhea. Negative for abdominal distention, abdominal pain, bloating, blood in stool, flatus, nausea and vomiting.  Genitourinary: Negative for decreased urine volume and difficulty urinating.  Musculoskeletal: Negative for arthralgias and myalgias.  Skin: Negative for color change and pallor.  Neurological: Negative for dizziness, syncope, weakness, light-headedness and headaches.  Psychiatric/Behavioral: Negative for confusion.  All other systems reviewed and are negative.        Observations/Objective: No vital signs or physical exam, this was a telephone or virtual health encounter.  Pt alert and oriented, answers all questions appropriately, and able to speak in full sentences.    Assessment and Plan: Atreyu was seen today for diarrhea.  Diagnoses and all orders for this visit:  Diarrhea in adult patient Colostomy in place [facility]) Diarrhea for 2 days. No melena or hematochezia. No recent antibiotic use. No known exposures or travel. Has tried imodium with minimal relief. Not concerning for acute bacterial infection. Likely gastroenteritis. Symptomatic care discussed. BRAT diet and adequate hydration. Wife will bring stool sample for evaluation, further treatment will be initiated if warranted. Pt declines COVID-19 testing. Report any new or worsening symptoms.  -     Stool culture; Future -     Ova and parasite examination; Future -     Clostridium difficile EIA; Future    Follow  Up Instructions: Return if symptoms worsen or fail to improve.    I discussed the assessment and treatment plan with the patient. The patient was provided an opportunity to ask questions and all were answered. The patient agreed with the plan and demonstrated an understanding of the instructions.   The patient was advised to call back or seek an in-person evaluation if the symptoms worsen or if the condition fails to improve as anticipated.  The above  assessment and management plan was discussed with the patient. The patient verbalized understanding of and has agreed to the management plan. Patient is aware to call the clinic if symptoms persist or worsen. Patient is aware when to return to the clinic for a follow-up visit. Patient educated on when it is appropriate to go to the emergency department.    I provided 15 minutes of non-face-to-face time during this encounter. The call started at 1100. The call ended at 1115. The other time was used for coordination of care.    Monia Pouch, FNP-C [facility] [address] [place], [place] [address] [phone]

## 2019-05-20 LAB — NOVEL CORONAVIRUS, NAA: SARS-CoV-2, NAA: NOT DETECTED

## 2019-05-21 LAB — OVA AND PARASITE EXAMINATION

## 2019-05-21 LAB — CLOSTRIDIUM DIFFICILE BY PCR: Toxigenic C. Difficile by PCR: NEGATIVE

## 2019-05-23 LAB — STOOL CULTURE: E coli, Shiga toxin Assay: NEGATIVE

## 2019-05-24 ENCOUNTER — Other Ambulatory Visit: Payer: Self-pay | Admitting: Cardiology

## 2019-05-26 ENCOUNTER — Other Ambulatory Visit: Payer: Self-pay | Admitting: *Deleted

## 2019-05-26 ENCOUNTER — Other Ambulatory Visit: Payer: Self-pay

## 2019-05-26 MED ORDER — CARVEDILOL 3.125 MG PO TABS: ORAL_TABLET | ORAL | 0 refills | Status: DC

## 2019-05-27 ENCOUNTER — Ambulatory Visit (INDEPENDENT_AMBULATORY_CARE_PROVIDER_SITE_OTHER): Payer: Medicare Other | Admitting: Family Medicine

## 2019-05-27 VITALS — BP 140/82 | HR 58 | Temp 97.4°F | Ht 64.0 in | Wt 158.0 lb

## 2019-05-27 DIAGNOSIS — N183 Chronic kidney disease, stage 3 unspecified: Secondary | ICD-10-CM

## 2019-05-27 DIAGNOSIS — I10 Essential (primary) hypertension: Secondary | ICD-10-CM | POA: Diagnosis not present

## 2019-05-27 DIAGNOSIS — R197 Diarrhea, unspecified: Secondary | ICD-10-CM

## 2019-05-27 DIAGNOSIS — I2510 Atherosclerotic heart disease of native coronary artery without angina pectoris: Secondary | ICD-10-CM | POA: Diagnosis not present

## 2019-05-27 MED ORDER — DIPHENOXYLATE-ATROPINE 2.5-0.025 MG PO TABS: 1.0000 | ORAL_TABLET | Freq: Four times a day (QID) | ORAL | 1 refills | Status: DC | PRN

## 2019-05-27 NOTE — Progress Notes (Signed)
Subjective:  Patient ID: Frederick Davis, male    DOB: 06-Jan-1923  Age: 85 y.o. MRN: [id number]  CC: Medical Management of Chronic Issues   HPI Frederick Davis [facility] presents for continued problems with multiple bowel movements that are loose.  He is having about 3 a day that are watery.  He has an ileostomy bag.  He is filling it up 3 times a day which is much more than usual for him.  He has had the ileostomy for approximately 34 years.  He is familiar with the usual flow.  He has no change in diet.  He has been using a supplement protein shake to help with nutrition.  His daughter is with him and gives much of the history.  She tells me that they are considering a gluten-free diet to see if that helps. Stool had been collected for culture, etc. Results were reviewed with pt. & daughter. They did not reveal an etiology.  Depression screen [facility] 2/9 05/27/2019 04/12/2019 09/03/2018  Decreased Interest 0 0 0  Down, Depressed, Hopeless 0 0 0  PHQ - 2 Score 0 0 0  Altered sleeping 0 - -  Tired, decreased energy 0 - -  Change in appetite 0 - -  Feeling bad or failure about yourself  0 - -  Trouble concentrating 0 - -  Moving slowly or fidgety/restless 0 - -  Suicidal thoughts 0 - -  PHQ-9 Score 0 - -  Difficult doing work/chores - - -  Some recent data might be hidden    History Frederick Davis has a past medical history of Anemia, Aortic stenosis, Arthritis, CAD (coronary artery disease), Chronic renal insufficiency, stage III (moderate) (HCC), Dyspnea, Esophageal stricture (YRS AGO), Hypertension, Hypothyroidism, Kidney stone (1969), MI (myocardial infarction) ([place]) (2017), Mitral regurgitation, Myocardial infarction ([place]) (03/12/13), Osteoporosis, PC (prostate cancer) ([place]) (1987), RBBB, Rectum cancer ([place]) (1986), and Renal cell cancer ([place]) (2005).   He has a past surgical history that includes Kidney surgery (Right, 2004); Radiofrequency ablation kidney (2006); renal sphincter appliance (2007);  Colonoscopy (01/08/2012); Colostomy (Left, 1986); Percutaneous coronary intervention-balloon only; left heart catheterization with coronary angiogram (N/A, 03/12/2013); Prostatectomy; Colonoscopy (N/A, 07/20/2015); Cardiac catheterization (N/A, 12/06/2015); Coronary artery bypass graft (N/A, 12/08/2015); TEE without cardioversion (N/A, 12/08/2015); Hernia repair; Removal of penile prosthesis (N/A, 12/15/2017); and Removal of penile prosthesis (N/A, 02/23/2018).   His family history includes AAA (abdominal aortic aneurysm) (age of onset: 13) in his father; Arthritis in his father; Cancer in his son; Hypertension in his son; Parkinson's disease in his sister; Stroke (age of onset: 39) in his mother.He reports that he quit smoking about 50 years ago. His smoking use included cigarettes. He started smoking about 72 years ago. He has a 5.00 pack-year smoking history. He has never used smokeless tobacco. He reports current alcohol use of about 1.0 standard drinks of alcohol per week. He reports that he does not use drugs.    ROS Review of Systems  Constitutional: Negative.   HENT: Negative.   Eyes: Negative for visual disturbance.  Respiratory: Negative for cough and shortness of breath.   Cardiovascular: Negative for chest pain and leg swelling.  Gastrointestinal: Positive for diarrhea. Negative for abdominal pain, anal bleeding, blood in stool, nausea and vomiting.  Genitourinary: Negative for difficulty urinating.  Musculoskeletal: Negative for arthralgias and myalgias.  Skin: Negative for rash.  Neurological: Negative for headaches.  Psychiatric/Behavioral: Negative for sleep disturbance.    Objective:  BP 140/82   Pulse (!) 58  Temp (!) 97.4 F (36.3 C) (Temporal)   Ht _0  (1.626 m)   Wt 158 lb (71.7 kg)   BMI 27.12 kg/m   BP Readings from Last 3 Encounters:  05/27/19 140/82  04/21/19 120/78  09/09/18 98/61    Wt Readings from Last 3 Encounters:  05/27/19 158 lb (71.7 kg)  04/21/19  163 lb (73.9 kg)  04/12/19 163 lb 6.4 oz (74.1 kg)     Physical Exam Vitals signs reviewed.  Constitutional:      General: He is not in acute distress.    Appearance: He is well-developed. He is not ill-appearing (elderly, frail).  HENT:     Head: Normocephalic and atraumatic.     Right Ear: Tympanic membrane and external ear normal. No decreased hearing noted.     Left Ear: Tympanic membrane and external ear normal. No decreased hearing noted.     Nose: Nose normal.     Mouth/Throat:     Pharynx: No oropharyngeal exudate or posterior oropharyngeal erythema.  Eyes:     Conjunctiva/sclera: Conjunctivae normal.     Pupils: Pupils are equal, round, and reactive to light.  Neck:     Musculoskeletal: Normal range of motion and neck supple.  Cardiovascular:     Rate and Rhythm: Normal rate and regular rhythm.     Heart sounds: Normal heart sounds. No murmur.  Pulmonary:     Effort: Pulmonary effort is normal. No respiratory distress.     Breath sounds: Normal breath sounds. No wheezing or rales.  Abdominal:     General: Bowel sounds are normal.     Palpations: Abdomen is soft. There is no mass.     Tenderness: There is no abdominal tenderness.     Comments: Ileostomy bag inspection WNL  Musculoskeletal: Normal range of motion.  Skin:    General: Skin is warm and dry.  Neurological:     Mental Status: He is alert and oriented to person, place, and time.     Deep Tendon Reflexes: Reflexes are normal and symmetric.  Psychiatric:        Behavior: Behavior normal.        Thought Content: Thought content normal.        Judgment: Judgment normal.    Results for orders placed or performed in visit on 05/27/19  CBC with Differential/Platelet  Result Value Ref Range   WBC 7.5 3.4 - 10.8 x10E3/uL   RBC 4.47 4.14 - 5.80 x10E6/uL   Hemoglobin 12.5 (L) 13.0 - 17.7 g/dL   Hematocrit 37.6 37.5 - 51.0 %   MCV 84 79 - 97 fL   MCH 28.0 26.6 - 33.0 pg   MCHC 33.2 31.5 - 35.7 g/dL   RDW  13.2 11.6 - 15.4 %   Platelets 242 150 - 450 x10E3/uL   Neutrophils 64 Not Estab. %   Lymphs 23 Not Estab. %   Monocytes 10 Not Estab. %   Eos 2 Not Estab. %   Basos 1 Not Estab. %   Neutrophils Absolute 4.8 1.4 - 7.0 x10E3/uL   Lymphocytes Absolute 1.7 0.7 - 3.1 x10E3/uL   Monocytes Absolute 0.7 0.1 - 0.9 x10E3/uL   EOS (ABSOLUTE) 0.1 0.0 - 0.4 x10E3/uL   Basophils Absolute 0.1 0.0 - 0.2 x10E3/uL   Immature Granulocytes 0 Not Estab. %   Immature Grans (Abs) 0.0 0.0 - 0.1 x10E3/uL  CMP14+EGFR  Result Value Ref Range   Glucose 90 65 - 99 mg/dL   BUN 20 10 -  36 mg/dL   Creatinine, Ser 1.68 (H) 0.76 - 1.27 mg/dL   GFR calc non Af Amer 34 (L) >59 mL/min/1.73   GFR calc Af Amer 40 (L) >59 mL/min/1.73   BUN/Creatinine Ratio 12 10 - 24   Sodium 138 134 - 144 mmol/L   Potassium 4.4 3.5 - 5.2 mmol/L   Chloride 103 96 - 106 mmol/L   CO2 17 (L) 20 - 29 mmol/L   Calcium 10.0 8.6 - 10.2 mg/dL   Total Protein 6.1 6.0 - 8.5 g/dL   Albumin 4.1 3.5 - 4.6 g/dL   Globulin, Total 2.0 1.5 - 4.5 g/dL   Albumin/Globulin Ratio 2.1 1.2 - 2.2   Bilirubin Total 0.5 0.0 - 1.2 mg/dL   Alkaline Phosphatase 92 39 - 117 IU/L   AST 15 0 - 40 IU/L   ALT 8 0 - 44 IU/L  Celiac Disease Panel  Result Value Ref Range   Endomysial IgA Negative Negative   Transglutaminase IgA <2 0 - 3 U/mL   IgA/Immunoglobulin A, Serum 26 (L) 61 - 437 mg/dL  Tissue transglutaminase, IgG  Result Value Ref Range   Tissue Transglut Ab <2 0 - 5 U/mL      Assessment & Plan:   Frederick Davis was seen today for medical management of chronic issues.  Diagnoses and all orders for this visit:  Diarrhea in adult patient -     Celiac Disease Panel -     diphenoxylate-atropine (LOMOTIL) 2.5-0.025 MG tablet; Take 1 tablet by mouth 4 (four) times daily as needed for diarrhea or loose stools.  Essential hypertension, benign -     CBC with Differential/Platelet  CKD (chronic kidney disease) stage 3, GFR 30-59 ml/min (HCC) -      CMP14+EGFR  Other orders -     Tissue transglutaminase, IgG    Trial of gluten free diet, handout given. Increase hydration.    I am having Frederick Davis. [place] start on diphenoxylate-atropine. I am also having him maintain his aspirin, acetaminophen, multivitamin, guaiFENesin (MUCINEX PO), levothyroxine, nitroGLYCERIN, furosemide, carvedilol, and imiquimod.  Allergies as of 05/27/2019      Reactions   Lipitor [atorvastatin] Other (See Comments)   Muscle aches      Medication List       Accurate as of May 27, 2019 11:59 PM. If you have any questions, ask your nurse or doctor.        acetaminophen 500 MG tablet Commonly known as: TYLENOL Take 500 mg by mouth at bedtime.   aspirin 81 MG tablet Take 1 tablet (81 mg total) by mouth daily. What changed: when to take this   carvedilol 3.125 MG tablet Commonly known as: COREG TAKE  (1)  TABLET TWICE A DAY WITH MEALS (BREAKFAST AND SUPPER)   diphenoxylate-atropine 2.5-0.025 MG tablet Commonly known as: Lomotil Take 1 tablet by mouth 4 (four) times daily as needed for diarrhea or loose stools.   furosemide 20 MG tablet Commonly known as: LASIX Take 1 tablet daily as needed.   imiquimod 5 % cream Commonly known as: ALDARA   levothyroxine 75 MCG tablet Commonly known as: SYNTHROID TAKE 1 TABLET DAILY   MUCINEX PO Take by mouth daily as needed.   multivitamin capsule Take 1 capsule daily by mouth.   nitroGLYCERIN 0.4 MG SL tablet Commonly known as: NITROSTAT PLACE 1 TABLET UNDER THE TONGUE AT ONSET OF CHEST PAIN EVERY 5 MINTUES UP TO 3 TIMES AS NEEDED FOR CHEST PAIN  Follow-up: No follow-ups on file.  Claretta Fraise, [name]

## 2019-05-27 NOTE — Patient Instructions (Signed)
Low-Gluten Eating Plan Gluten is a protein that is found in wheat, barley, rye, and triticale, a hybrid of wheat and rye. Some people have a condition that makes them unable to digest gluten. For those people, eating just a small amount of gluten can damage their intestines. This is not a gluten-free eating plan. This low-gluten eating plan is for people who feel better when they eat less gluten. What are tips for following this plan? Reading food labels  Make sure to read food labels.  Look for wheat, rye, barley, oats (unless it says "certified gluten-free"), malt, and brewer's yeast. If the food contains any of these, it has gluten in it.  Wheat-free does not mean gluten-free. Meal planning  Eat a variety of foods so you get all of the nutrients that you need.  To have more control over the ingredients in your meals, consider making food yourself instead of buying prepared foods. General information  Many gluten-free grain products are not fortified with vitamins and minerals like gluten-containing grains are. Because of this, there is a risk of nutrient deficiencies if you do not eat a balanced diet. Make sure to meet with your health care provider or a registered dietitian to review your diet.  When eating out, look for restaurants that have gluten-free options or can make substitutions to accommodate this eating plan. Consider calling a restaurant ahead of time to discuss their menu. What foods can I eat?  With this eating plan, you can eat anything that is labeled "gluten-free" or that does not contain wheat or other grains that have gluten. Fruits All fruits, such as bananas, apples, oranges, grapes, papaya, mango, pomegranate, kiwi, grapefruit, and cherries. Vegetables All vegetables that are not in a sauce that would contain gluten, such as lettuce, spinach, peas, beets, cauliflower, cabbage, broccoli, carrots, tomatoes, squash, eggplant, herbs, peppers, onions, cucumbers,  Brussels sprouts, yams, and sweet potatoes. Grains Naturally gluten-free grains and flours, including rice, bulgur, quinoa, corn, buckwheat, cassava, amaranth, millet, polenta or cornmeal, tapioca, flax, chia, yucca, sorghum, and teff. Corn tortillas or taco shells. Oatmeal that is labeled as "gluten-free" or "uncontaminated." Nut flours. Meats and other proteins Beef. Pork. Chicken. Turkey. Fish. Eggs. Tofu. Beans. Nuts. Lentils. Dairy Milk. Ice cream. Yogurt. Cheese. Cottage cheese. Beverages Water. Coffee. Tea. Juice. Soda. Seltzer water. Distilled alcohols. Wine. Seasonings and condiments Mustard. Relish. Ketchup. Barbecue sauce. Vinegar. Mayonnaise. Tamari. Sweets and desserts Honey. Sugar. Maple syrup. Fats and oils Butter. Vegetable oil. Olive oil. Canola oil. Walnut oil. Other foods Arrowroot or cornstarch. Potato flour. The items listed above may not be a complete list of foods and beverages you can eat. Contact a dietitian for more information. What foods should I avoid? Grains Wheat. Barley. Rye. Oatmeal that is not certified gluten-free. Triticale. Meats and other proteins Seitan. Precooked or cured meat, such as sausages or meat loaves. Hot dogs. Salami. Beverages Beer, ale, lager, and malt beverages made from gluten-containing grains. Seasonings and condiments Malt vinegar. Salad dressing. Soy sauce. Teriyaki sauce. Marinades. Check the label of any pre-made sauces for a full list of ingredients. Sweets and desserts Licorice. Brown rice syrup. Pre-made pudding or pudding mixes. Other foods Bouillon cubes. Canned or boxed pre-made soups or soup packets. Bagged chips, such as potato chips and tortilla chips. Seasoning packets. Energy bars. Seasoned rice mixes. Processed foods. The items listed above may not be a complete list of foods and beverages to avoid. Contact a dietitian for more information. Summary  Gluten is a protein that   is found in wheat, barley, rye, and  triticale, a hybrid of wheat and rye.  This low-gluten eating plan is for people who feel better when they eat less gluten.  Reading food labels of packaged foods is the best way to make sure you are following a low-gluten eating plan. Look for "gluten-free" on the label.  Eat a variety of foods and colors and use whole grains that are naturally gluten-free to reduce your risk of having any nutrient deficiencies. This information is not intended to replace advice given to you by your health care provider. Make sure you discuss any questions you have with your health care provider. Document Released: 02/21/2015 Document Revised: 06/10/2018 Document Reviewed: 06/10/2018 Elsevier Patient Education  2020 [employer].

## 2019-05-28 LAB — CBC WITH DIFFERENTIAL/PLATELET
Basophils Absolute: 0.1 10*3/uL (ref 0.0–0.2)
Basos: 1 %
EOS (ABSOLUTE): 0.1 10*3/uL (ref 0.0–0.4)
Eos: 2 %
Hematocrit: 37.6 % (ref 37.5–51.0)
Hemoglobin: 12.5 g/dL — ABNORMAL LOW (ref 13.0–17.7)
Immature Grans (Abs): 0 10*3/uL (ref 0.0–0.1)
Immature Granulocytes: 0 %
Lymphocytes Absolute: 1.7 10*3/uL (ref 0.7–3.1)
Lymphs: 23 %
MCH: 28 pg (ref 26.6–33.0)
MCHC: 33.2 g/dL (ref 31.5–35.7)
MCV: 84 fL (ref 79–97)
Monocytes Absolute: 0.7 10*3/uL (ref 0.1–0.9)
Monocytes: 10 %
Neutrophils Absolute: 4.8 10*3/uL (ref 1.4–7.0)
Neutrophils: 64 %
Platelets: 242 10*3/uL (ref 150–450)
RBC: 4.47 x10E6/uL (ref 4.14–5.80)
RDW: 13.2 % (ref 11.6–15.4)
WBC: 7.5 10*3/uL (ref 3.4–10.8)

## 2019-05-28 LAB — CMP14+EGFR
ALT: 8 IU/L (ref 0–44)
AST: 15 IU/L (ref 0–40)
Albumin/Globulin Ratio: 2.1 (ref 1.2–2.2)
Albumin: 4.1 g/dL (ref 3.5–4.6)
Alkaline Phosphatase: 92 IU/L (ref 39–117)
BUN/Creatinine Ratio: 12 (ref 10–24)
BUN: 20 mg/dL (ref 10–36)
Bilirubin Total: 0.5 mg/dL (ref 0.0–1.2)
CO2: 17 mmol/L — ABNORMAL LOW (ref 20–29)
Calcium: 10 mg/dL (ref 8.6–10.2)
Chloride: 103 mmol/L (ref 96–106)
Creatinine, Ser: 1.68 mg/dL — ABNORMAL HIGH (ref 0.76–1.27)
GFR calc Af Amer: 40 mL/min/{1.73_m2} — ABNORMAL LOW (ref >59)
GFR calc non Af Amer: 34 mL/min/{1.73_m2} — ABNORMAL LOW (ref >59)
Globulin, Total: 2 g/dL (ref 1.5–4.5)
Glucose: 90 mg/dL (ref 65–99)
Potassium: 4.4 mmol/L (ref 3.5–5.2)
Sodium: 138 mmol/L (ref 134–144)
Total Protein: 6.1 g/dL (ref 6.0–8.5)

## 2019-05-28 LAB — CELIAC DISEASE PANEL
Endomysial IgA: NEGATIVE
IgA/Immunoglobulin A, Serum: 26 mg/dL — ABNORMAL LOW (ref 61–437)
Transglutaminase IgA: <2 U/mL (ref 0–3)

## 2019-05-28 LAB — TISSUE TRANSGLUTAMINASE, IGG: Tissue Transglut Ab: <2 U/mL (ref 0–5)

## 2019-05-30 ENCOUNTER — Encounter: Payer: Self-pay | Admitting: Family Medicine

## 2019-05-31 ENCOUNTER — Telehealth: Payer: Self-pay | Admitting: *Deleted

## 2019-05-31 ENCOUNTER — Other Ambulatory Visit: Payer: Self-pay | Admitting: *Deleted

## 2019-05-31 DIAGNOSIS — R197 Diarrhea, unspecified: Secondary | ICD-10-CM

## 2019-05-31 DIAGNOSIS — Z933 Colostomy status: Secondary | ICD-10-CM

## 2019-05-31 NOTE — Telephone Encounter (Signed)
I'm out of ideas. He needs referral to GI.

## 2019-05-31 NOTE — Telephone Encounter (Signed)
Stool cultures negative.  Wife states that patient is having to irrigate through the night and issue has not getting any better.  Would like to know what to do now?

## 2019-05-31 NOTE — Telephone Encounter (Signed)
Patient ware and referral placed

## 2019-06-02 ENCOUNTER — Telehealth: Payer: Self-pay | Admitting: Family Medicine

## 2019-06-03 ENCOUNTER — Other Ambulatory Visit: Payer: Self-pay | Admitting: Family Medicine

## 2019-06-03 DIAGNOSIS — Z933 Colostomy status: Secondary | ICD-10-CM

## 2019-06-03 DIAGNOSIS — R197 Diarrhea, unspecified: Secondary | ICD-10-CM

## 2019-06-03 MED ORDER — DIPHENOXYLATE-ATROPINE 2.5-0.025 MG PO TABS: 2.0000 | ORAL_TABLET | Freq: Four times a day (QID) | ORAL | 1 refills | Status: DC | PRN

## 2019-06-03 MED ORDER — RIFAXIMIN 200 MG PO TABS: 200.0000 mg | ORAL_TABLET | Freq: Three times a day (TID) | ORAL | 1 refills | Status: DC

## 2019-06-03 NOTE — Telephone Encounter (Signed)
Wife aware that patient's referral is being worked on today.  Wife would like to know if Dr. Artemio Aly can send him in something stronger until the mean time. Please review and advise

## 2019-06-03 NOTE — Telephone Encounter (Signed)
Referrals notified

## 2019-06-03 NOTE — Telephone Encounter (Signed)
Patient wife is calling and is very upset crying stating they are [facility] patients and they need updates they need to know what is going on they are scared. Wants a call back ASAP

## 2019-06-03 NOTE — Telephone Encounter (Signed)
Please redirect urgent GI Referral to Dr. Earlean Shawl, per pt. Request. I had sent it for Dr. Laural Golden, they do not want to see him. WS

## 2019-06-07 ENCOUNTER — Other Ambulatory Visit: Payer: Self-pay | Admitting: Family Medicine

## 2019-06-07 ENCOUNTER — Telehealth: Payer: Self-pay | Admitting: Family Medicine

## 2019-06-07 ENCOUNTER — Telehealth: Payer: Self-pay | Admitting: *Deleted

## 2019-06-07 MED ORDER — VIBERZI 100 MG PO TABS: 100.0000 mg | ORAL_TABLET | Freq: Two times a day (BID) | ORAL | 5 refills | Status: DC

## 2019-06-07 NOTE — Telephone Encounter (Signed)
Aware GI to call

## 2019-06-07 NOTE — Telephone Encounter (Signed)
Prior Auth for Xifaxan 200mg -DENIED  [id number]    Xifaxan 200mg  is not FDA approved for your medical conditions: Diarrhea, unspecified and colostomy status. These conditions are not supported by one of the accepted references. Therefore your drug is denied because it is not being used for a "medically accepted indication."    If the treating physician would like to discuss this coverage decision with the physician or health care professional reviewer, please call [employer] Prior Authorization department at [phone].

## 2019-06-07 NOTE — Telephone Encounter (Signed)
I sent in viberzi as a substitute.

## 2019-06-07 NOTE — Telephone Encounter (Signed)
Prior Auth for Xifaxan 200mg -In Process  (Key: [id number])   [employer] is reviewing your PA request. Typically an electronic response will be received within 72 hours. To check for an update later, open this request from your dashboard.  You may close this dialog and return to your dashboard to perform other tasks.

## 2019-06-08 NOTE — Telephone Encounter (Signed)
Wife aware

## 2019-06-09 ENCOUNTER — Telehealth: Payer: Self-pay | Admitting: *Deleted

## 2019-06-09 NOTE — Telephone Encounter (Signed)
Prior Auth for Viberzi 100mg  tab-In Process  [id number]  Key: [id number]    [employer] is reviewing your PA request. Typically an electronic response will be received within 72 hours. To check for an update later, open this request from your dashboard.  You may close this dialog and return to your dashboard to perform other tasks.

## 2019-06-10 NOTE — Telephone Encounter (Signed)
We need to expedite his referral to GI and make sure wife knows to give diphenoxalate 2 pills 4 X a day.

## 2019-06-10 NOTE — Telephone Encounter (Signed)
Wife aware

## 2019-06-10 NOTE — Telephone Encounter (Signed)
Prior Auth for Viberzi 100mg  tab-DENIED  Reason for Denial-Viberzi is not FDA approved for diarrhea in adult pt.

## 2019-06-11 NOTE — Telephone Encounter (Signed)
Called pt and notified of appt.  They will keep this appt.

## 2019-06-11 NOTE — Telephone Encounter (Signed)
FYI - Patient is scheduled to see Dr. Earlean Shawl, August 26th at 9:40am

## 2019-06-11 NOTE — Telephone Encounter (Signed)
Please contact the patient. I can not get the appointment any sooner for them. They can call Dr. Liliane Channel office to see if they can put him on a cancellation list. WS

## 2019-06-14 ENCOUNTER — Telehealth: Payer: Self-pay | Admitting: Cardiology

## 2019-06-14 NOTE — Telephone Encounter (Signed)
Spoke with patient and advised Dr Percival Spanish requested Echo be repeated at his November office visit Patient will proceed with Echo as ordered

## 2019-06-14 NOTE — Telephone Encounter (Signed)
New message   Patient calling with questions regarding echo. Patient states he did not know Dr Percival Spanish wanted him to have this test. Wants confirmation test is needed

## 2019-06-15 ENCOUNTER — Ambulatory Visit (HOSPITAL_COMMUNITY): Payer: Medicare Other | Attending: Cardiology

## 2019-06-15 ENCOUNTER — Other Ambulatory Visit: Payer: Self-pay

## 2019-06-15 DIAGNOSIS — I10 Essential (primary) hypertension: Secondary | ICD-10-CM | POA: Insufficient documentation

## 2019-06-15 DIAGNOSIS — I350 Nonrheumatic aortic (valve) stenosis: Secondary | ICD-10-CM

## 2019-06-15 DIAGNOSIS — I2510 Atherosclerotic heart disease of native coronary artery without angina pectoris: Secondary | ICD-10-CM | POA: Diagnosis not present

## 2019-06-16 DIAGNOSIS — R194 Change in bowel habit: Secondary | ICD-10-CM | POA: Diagnosis not present

## 2019-06-21 ENCOUNTER — Ambulatory Visit: Payer: Medicare Other | Admitting: Family Medicine

## 2019-06-26 ENCOUNTER — Other Ambulatory Visit: Payer: Self-pay | Admitting: Family Medicine

## 2019-06-29 ENCOUNTER — Ambulatory Visit (INDEPENDENT_AMBULATORY_CARE_PROVIDER_SITE_OTHER): Payer: Medicare Other | Admitting: Family Medicine

## 2019-06-29 DIAGNOSIS — I10 Essential (primary) hypertension: Secondary | ICD-10-CM

## 2019-06-29 DIAGNOSIS — R197 Diarrhea, unspecified: Secondary | ICD-10-CM | POA: Diagnosis not present

## 2019-06-29 DIAGNOSIS — Z933 Colostomy status: Secondary | ICD-10-CM | POA: Diagnosis not present

## 2019-06-29 DIAGNOSIS — E7800 Pure hypercholesterolemia, unspecified: Secondary | ICD-10-CM

## 2019-06-29 DIAGNOSIS — I2510 Atherosclerotic heart disease of native coronary artery without angina pectoris: Secondary | ICD-10-CM

## 2019-06-29 MED ORDER — DIPHENOXYLATE-ATROPINE 2.5-0.025 MG PO TABS: 2.0000 | ORAL_TABLET | Freq: Four times a day (QID) | ORAL | 1 refills | Status: DC | PRN

## 2019-06-29 NOTE — Progress Notes (Signed)
No answer 3:34    Subjective:    Patient ID: Frederick Davis, male    DOB: 01/06/1923, 85 y.o.   MRN: [id number]   HPI: Frederick Davis is a 85 y.o. male presenting for recurrent diarrhea. Saw GI, Dr. Earlean Shawl. He recommended aking lomotil 2 q AM. Doing well with that regimen. Off Lactose as well. Ate [place] food yesterday. Loose stool this AM. Ate some Chinese at lunch today. Had diarrhea afterward. Took an extra lomotil, that stopped it. Had cramping and pressure earlier. Calling it pain would be too strong. Denies edema.    presents for  follow-up of hypertension. Patient has no history of headache chest pain or shortness of breath or recent cough. Patient also denies symptoms of TIA such as focal numbness or weakness. Patient denies side effects from medication. States taking it regularly. Running 110-120/ 85-90    Depression screen [facility] 2/9 05/27/2019 04/12/2019 09/03/2018 08/20/2018 04/20/2018  Decreased Interest 0 0 0 0 0  Down, Depressed, Hopeless 0 0 0 0 0  PHQ - 2 Score 0 0 0 0 0  Altered sleeping 0 - - - -  Tired, decreased energy 0 - - - -  Change in appetite 0 - - - -  Feeling bad or failure about yourself  0 - - - -  Trouble concentrating 0 - - - -  Moving slowly or fidgety/restless 0 - - - -  Suicidal thoughts 0 - - - -  PHQ-9 Score 0 - - - -  Difficult doing work/chores - - - - -  Some recent data might be hidden     Relevant past medical, surgical, family and social history reviewed and updated as indicated.  Interim medical history since our last visit reviewed. Allergies and medications reviewed and updated.  ROS:  Review of Systems  Constitutional: Negative.   HENT: Negative.   Eyes: Negative for visual disturbance.  Respiratory: Negative for cough and shortness of breath.   Cardiovascular: Negative for chest pain and leg swelling.  Gastrointestinal: Positive for abdominal distention and diarrhea. Negative for abdominal pain, nausea and vomiting.   Genitourinary: Negative for difficulty urinating.  Musculoskeletal: Negative for arthralgias and myalgias.  Skin: Negative for rash.  Neurological: Negative for headaches.  Psychiatric/Behavioral: Negative for sleep disturbance.     Social History   Tobacco Use  Smoking Status Former Smoker  . Packs/day: 0.50  . Years: 10.00  . Pack years: 5.00  . Types: Cigarettes  . Start date: 10/21/1946  . Quit date: 10/21/1968  . Years since quitting: 50.7  Smokeless Tobacco Never Used       Objective:     Wt Readings from Last 3 Encounters:  05/27/19 158 lb (71.7 kg)  04/21/19 163 lb (73.9 kg)  04/12/19 163 lb 6.4 oz (74.1 kg)     Exam deferred. Pt. [place] due to COVID 19. Phone visit performed.   Assessment & Plan:   1. Essential hypertension, benign   2. Diarrhea in adult patient   3. Colostomy in place [facility])   4. Pure hypercholesterolemia     Meds ordered this encounter  Medications  . diphenoxylate-atropine (LOMOTIL) 2.5-0.025 MG tablet    Sig: Take 2 tablets by mouth 4 (four) times daily as needed for diarrhea or loose stools.    Dispense:  30 tablet    Refill:  1    No orders of the defined types were placed in this encounter.     Diagnoses and all orders  for this visit:  Essential hypertension, benign  Diarrhea in adult patient -     diphenoxylate-atropine (LOMOTIL) 2.5-0.025 MG tablet; Take 2 tablets by mouth 4 (four) times daily as needed for diarrhea or loose stools.  Colostomy in place [facility])  Pure hypercholesterolemia    Virtual Visit via telephone Note  I discussed the limitations, risks, security and privacy concerns of performing an evaluation and management service by telephone and the availability of in person appointments. The patient was identified with two identifiers. Pt.expressed understanding and agreed to proceed. Pt. Is at home. Dr. Livia Snellen is in his office.  Follow Up Instructions:   I discussed the assessment and treatment plan  with the patient. The patient was provided an opportunity to ask questions and all were answered. The patient agreed with the plan and demonstrated an understanding of the instructions.   The patient was advised to call back or seek an in-person evaluation if the symptoms worsen or if the condition fails to improve as anticipated.   Total minutes including chart review and phone contact time: 25 Lactose free diet printed to be mailed.  Follow up plan: Return in about 1 month (around 07/29/2019), or if symptoms worsen or fail to improve.  Claretta Fraise, MD [place]

## 2019-06-29 NOTE — Patient Instructions (Signed)
Lactose-Free Diet, Adult If you have lactose intolerance, you are not able to digest lactose. Lactose is a natural sugar found mainly in dairy milk and dairy products. You may need to avoid all foods and beverages that contain lactose. A lactose-free diet can help you do this. Which foods have lactose? Lactose is found in dairy milk and dairy products, such as:  Yogurt.  Cheese.  Butter.  Margarine.  Sour cream.  Cream.  Whipped toppings and nondairy creamers.  Ice cream and other dairy-based desserts. Lactose is also found in foods or products made with dairy milk or milk ingredients. To find out whether a food contains dairy milk or a milk ingredient, look at the ingredients list. Avoid foods with the statement "May contain milk" and foods that contain:  Milk powder.  Whey.  Curd.  Caseinate.  Lactose.  Lactalbumin.  Lactoglobulin. What are alternatives to dairy milk and foods made with milk products?  Lactose-free milk.  Soy milk with added calcium and vitamin D.  Almond milk, coconut milk, rice milk, or other nondairy milk alternatives with added calcium and vitamin D. Note that these are low in protein.  Soy products, such as soy yogurt, soy cheese, soy ice cream, and soy-based sour cream.  Other nut milk products, such as almond yogurt, almond cheese, cashew yogurt, cashew cheese, cashew ice cream, coconut yogurt, and coconut ice cream. What are tips for following this plan?  Do not consume foods, beverages, vitamins, minerals, or medicines containing lactose. Read ingredient lists carefully.  Look for the words "lactose-free" on labels.  Use lactase enzyme drops or tablets as directed by your health care provider.  Use lactose-free milk or a milk alternative, such as soy milk or almond milk, for drinking and cooking.  Make sure you get enough calcium and vitamin D in your diet. A lactose-free eating plan can be lacking in these important nutrients.   Take calcium and vitamin D supplements as directed by your health care provider. Talk to your health care provider about supplements if you are not able to get enough calcium and vitamin D from food. What foods can I eat?  Fruits All fresh, canned, frozen, or dried fruits that are not processed with lactose. Vegetables All fresh, frozen, and canned vegetables without cheese, cream, or butter sauces. Grains Any that are not made with dairy milk or dairy products. Meats and other proteins Any meat, fish, poultry, and other protein sources that are not made with dairy milk or dairy products. Soy cheese and yogurt. Fats and oils Any that are not made with dairy milk or dairy products. Beverages Lactose-free milk. Soy, rice, or almond milk with added calcium and vitamin D. Fruit and vegetable juices. Sweets and desserts Any that are not made with dairy milk or dairy products. Seasonings and condiments Any that are not made with dairy milk or dairy products. Calcium Calcium is found in many foods that contain lactose and is important for bone health. The amount of calcium you need depends on your age:  Adults younger than 50 years: 1,000 mg of calcium a day.  Adults older than 50 years: 1,200 mg of calcium a day. If you are not getting enough calcium, you may get it from other sources, including:  Orange juice with calcium added. There are 300-350 mg of calcium in 1 cup of orange juice.  Calcium-fortified soy milk. There are 300-400 mg of calcium in 1 cup of calcium-fortified soy milk.  Calcium-fortified rice or almond milk.  There are 300 mg of calcium in 1 cup of calcium-fortified rice or almond milk.  Calcium-fortified breakfast cereals. There are 100-1,000 mg of calcium in calcium-fortified breakfast cereals.  Spinach, cooked. There are 145 mg of calcium in  cup of cooked spinach.  Edamame, cooked. There are 130 mg of calcium in  cup of cooked edamame.  Collard greens, cooked.  There are 125 mg of calcium in  cup of cooked collard greens.  Kale, frozen or cooked. There are 90 mg of calcium in  cup of cooked or frozen kale.  Almonds. There are 95 mg of calcium in  cup of almonds.  Broccoli, cooked. There are 60 mg of calcium in 1 cup of cooked broccoli. The items listed above may not be a complete list of recommended foods and beverages. Contact a dietitian for more options. What foods are not recommended? Fruits None, unless they are made with dairy milk or dairy products. Vegetables None, unless they are made with dairy milk or dairy products. Grains Any grains that are made with dairy milk or dairy products. Meats and other proteins None, unless they are made with dairy milk or dairy products. Dairy All dairy products, including milk, goat's milk, buttermilk, kefir, acidophilus milk, flavored milk, evaporated milk, condensed milk, dulce de leche, eggnog, yogurt, cheese, and cheese spreads. Fats and oils Any that are made with milk or milk products. Margarines and salad dressings that contain milk or cheese. Cream. Half and half. Cream cheese. Sour cream. Chip dips made with sour cream or yogurt. Beverages Hot chocolate. Cocoa with lactose. Instant iced teas. Powdered fruit drinks. Smoothies made with dairy milk or yogurt. Sweets and desserts Any that are made with milk or milk products. Seasonings and condiments Chewing gum that has lactose. Spice blends if they contain lactose. Artificial sweeteners that contain lactose. Nondairy creamers. The items listed above may not be a complete list of foods and beverages to avoid. Contact a dietitian for more information. Summary  If you are lactose intolerant, it means that you have a hard time digesting lactose, a natural sugar found in milk and milk products.  Following a lactose-free diet can help you manage this condition.  Calcium is important for bone health and is found in many foods that contain  lactose. Talk with your health care provider about other sources of calcium. This information is not intended to replace advice given to you by your health care provider. Make sure you discuss any questions you have with your health care provider. Document Released: 03/29/2002 Document Revised: 11/04/2017 Document Reviewed: 11/04/2017 Elsevier Patient Education  2020 Elsevier Inc.  

## 2019-06-30 ENCOUNTER — Encounter: Payer: Self-pay | Admitting: Family Medicine

## 2019-06-30 DIAGNOSIS — R197 Diarrhea, unspecified: Secondary | ICD-10-CM | POA: Insufficient documentation

## 2019-07-07 DIAGNOSIS — K909 Intestinal malabsorption, unspecified: Secondary | ICD-10-CM | POA: Diagnosis not present

## 2019-07-07 DIAGNOSIS — R197 Diarrhea, unspecified: Secondary | ICD-10-CM | POA: Diagnosis not present

## 2019-07-08 DIAGNOSIS — R197 Diarrhea, unspecified: Secondary | ICD-10-CM | POA: Diagnosis not present

## 2019-07-23 ENCOUNTER — Other Ambulatory Visit: Payer: Self-pay | Admitting: Family Medicine

## 2019-07-23 MED ORDER — LEVOTHYROXINE SODIUM 75 MCG PO TABS: 75.0000 ug | ORAL_TABLET | Freq: Every day | ORAL | 2 refills | Status: DC

## 2019-07-26 ENCOUNTER — Other Ambulatory Visit: Payer: Self-pay | Admitting: Family Medicine

## 2019-07-27 ENCOUNTER — Other Ambulatory Visit: Payer: Self-pay | Admitting: *Deleted

## 2019-07-27 DIAGNOSIS — R197 Diarrhea, unspecified: Secondary | ICD-10-CM | POA: Diagnosis not present

## 2019-07-27 NOTE — Progress Notes (Signed)
The Viberzi wasn't covered by pt insurance. Pt was referred to GI

## 2019-08-02 ENCOUNTER — Ambulatory Visit (INDEPENDENT_AMBULATORY_CARE_PROVIDER_SITE_OTHER): Payer: Medicare Other | Admitting: *Deleted

## 2019-08-02 ENCOUNTER — Other Ambulatory Visit: Payer: Self-pay

## 2019-08-02 DIAGNOSIS — Z23 Encounter for immunization: Secondary | ICD-10-CM

## 2019-08-09 ENCOUNTER — Other Ambulatory Visit: Payer: Self-pay

## 2019-08-09 DIAGNOSIS — Z85828 Personal history of other malignant neoplasm of skin: Secondary | ICD-10-CM | POA: Diagnosis not present

## 2019-08-09 DIAGNOSIS — C44622 Squamous cell carcinoma of skin of right upper limb, including shoulder: Secondary | ICD-10-CM | POA: Diagnosis not present

## 2019-08-09 DIAGNOSIS — D485 Neoplasm of uncertain behavior of skin: Secondary | ICD-10-CM | POA: Diagnosis not present

## 2019-08-10 ENCOUNTER — Ambulatory Visit (INDEPENDENT_AMBULATORY_CARE_PROVIDER_SITE_OTHER): Payer: Medicare Other | Admitting: Family Medicine

## 2019-08-10 ENCOUNTER — Encounter: Payer: Self-pay | Admitting: Family Medicine

## 2019-08-10 VITALS — BP 112/69 | HR 41 | Temp 96.6°F | Resp 20 | Ht 64.0 in | Wt 152.0 lb

## 2019-08-10 DIAGNOSIS — R001 Bradycardia, unspecified: Secondary | ICD-10-CM | POA: Diagnosis not present

## 2019-08-10 DIAGNOSIS — Z933 Colostomy status: Secondary | ICD-10-CM

## 2019-08-10 DIAGNOSIS — I350 Nonrheumatic aortic (valve) stenosis: Secondary | ICD-10-CM

## 2019-08-10 DIAGNOSIS — I340 Nonrheumatic mitral (valve) insufficiency: Secondary | ICD-10-CM

## 2019-08-10 DIAGNOSIS — I2510 Atherosclerotic heart disease of native coronary artery without angina pectoris: Secondary | ICD-10-CM | POA: Diagnosis not present

## 2019-08-10 DIAGNOSIS — R197 Diarrhea, unspecified: Secondary | ICD-10-CM | POA: Diagnosis not present

## 2019-08-10 NOTE — Patient Instructions (Addendum)
Do not exceed 2 lomotil per day due to slow heart rate

## 2019-08-10 NOTE — Progress Notes (Signed)
Subjective:  Patient ID: Frederick Davis, male    DOB: [birth year]  Age: 85 y.o. MRN: [id number]  CC: Medical Management of Chronic Issues (1 mo ), Hypertension, and Diarrhea (follow up )   HPI Frederick Davis presents for recheck of diarrhea. Now on a probiotic and lomotil regimen. Now having occasional loose BM, but having normal BMs mostly (as deined by the presence of a colostomy.) His heart rate is low. He denies any chesat pain, dizziness, light headedness and orthostatic changes.    presents for  follow-up of hypertension. Patient has no history of headache chest pain or shortness of breath or recent cough. Patient also denies symptoms of TIA such as focal numbness or weakness. Patient denies side effects from medication. States taking it regularly.   Depression screen [facility] 2/9 08/10/2019 05/27/2019 04/12/2019  Decreased Interest 0 0 0  Down, Depressed, Hopeless 0 0 0  PHQ - 2 Score 0 0 0  Altered sleeping - 0 -  Tired, decreased energy - 0 -  Change in appetite - 0 -  Feeling bad or failure about yourself  - 0 -  Trouble concentrating - 0 -  Moving slowly or fidgety/restless - 0 -  Suicidal thoughts - 0 -  PHQ-9 Score - 0 -  Difficult doing work/chores - - -  Some recent data might be hidden    History Frederick Davis has a past medical history of Anemia, Aortic stenosis, Arthritis, CAD (coronary artery disease), Chronic renal insufficiency, stage III (moderate), Dyspnea, Esophageal stricture (YRS AGO), Hypertension, Hypothyroidism, Kidney stone (1969), MI (myocardial infarction) ([place]) (2017), Mitral regurgitation, Myocardial infarction ([place]) (03/12/13), Osteoporosis, PC (prostate cancer) ([place]) (1987), RBBB, Rectum cancer ([place]) (1986), and Renal cell cancer ([place]) (2005).   He has a past surgical history that includes Kidney surgery (Right, 2004); Radiofrequency ablation kidney (2006); renal sphincter appliance (2007); Colonoscopy (01/08/2012); Colostomy (Left, 1986); Percutaneous coronary  intervention-balloon only; left heart catheterization with coronary angiogram (N/A, 03/12/2013); Prostatectomy; Colonoscopy (N/A, 07/20/2015); Cardiac catheterization (N/A, 12/06/2015); Coronary artery bypass graft (N/A, 12/08/2015); TEE without cardioversion (N/A, 12/08/2015); Hernia repair; Removal of penile prosthesis (N/A, 12/15/2017); and Removal of penile prosthesis (N/A, 02/23/2018).   His family history includes AAA (abdominal aortic aneurysm) (age of onset: 63) in his father; Arthritis in his father; Cancer in his son; Hypertension in his son; Parkinson's disease in his sister; Stroke (age of onset: 58) in his mother.He reports that he quit smoking about 50 years ago. His smoking use included cigarettes. He started smoking about 72 years ago. He has a 5.00 pack-year smoking history. He has never used smokeless tobacco. He reports current alcohol use of about 1.0 standard drinks of alcohol per week. He reports that he does not use drugs.    ROS Review of Systems  Constitutional: Negative for fever.  Respiratory: Negative for shortness of breath.   Cardiovascular: Negative for chest pain.  Gastrointestinal: Positive for diarrhea. Negative for abdominal pain, blood in stool, constipation and nausea.  Musculoskeletal: Negative for arthralgias.  Skin: Negative for rash.    Objective:  BP 112/69   Pulse (!) 41   Temp (!) 96.6 F (35.9 C)   Resp 20   Ht 5\' 4"  (1.626 m)   Wt 152 lb (68.9 kg)   SpO2 99%   BMI 26.09 kg/m   BP Readings from Last 3 Encounters:  08/10/19 112/69  05/27/19 140/82  04/21/19 120/78    Wt Readings from Last 3 Encounters:  08/10/19 152 lb (68.9 kg)  05/27/19 158 lb (71.7 kg)  04/21/19 163 lb (73.9 kg)     Physical Exam Vitals signs reviewed.  Constitutional:      Appearance: He is well-developed.  HENT:     Head: Normocephalic and atraumatic.     Right Ear: Tympanic membrane and external ear normal. No decreased hearing noted.     Left Ear: Tympanic  membrane and external ear normal. No decreased hearing noted.     Mouth/Throat:     Pharynx: No oropharyngeal exudate or posterior oropharyngeal erythema.  Eyes:     Pupils: Pupils are equal, round, and reactive to light.  Neck:     Musculoskeletal: Normal range of motion and neck supple.  Cardiovascular:     Rate and Rhythm: Regular rhythm. Bradycardia present.     Heart sounds: Murmur present.  Pulmonary:     Effort: No respiratory distress.     Breath sounds: Normal breath sounds.  Abdominal:     General: Bowel sounds are normal.     Palpations: Abdomen is soft. There is no mass.     Tenderness: There is no abdominal tenderness.       Assessment & Plan:   Frederick Davis was seen today for medical management of chronic issues, hypertension and diarrhea.  Diagnoses and all orders for this visit:  Slow heart rate -     TSH + free T4  Diarrhea in adult patient  Coronary artery disease involving native coronary artery of native heart without angina pectoris  Nonrheumatic aortic valve stenosis  Nonrheumatic mitral valve regurgitation  Colostomy in place [facility])       I am having Frederick Davis. [facility] maintain his aspirin, acetaminophen, multivitamin, guaiFENesin (MUCINEX PO), nitroGLYCERIN, furosemide, imiquimod, diphenoxylate-atropine, levothyroxine, carvedilol, and Probiotic Product (VISBIOME PO).  Allergies as of 08/10/2019      Reactions   Lipitor [atorvastatin] Other (See Comments)   Muscle aches      Medication List       Accurate as of August 10, 2019  8:48 PM. If you have any questions, ask your nurse or doctor.        acetaminophen 500 MG tablet Commonly known as: TYLENOL Take 500 mg by mouth at bedtime.   aspirin 81 MG tablet Take 1 tablet (81 mg total) by mouth daily. What changed: when to take this   carvedilol 3.125 MG tablet Commonly known as: COREG TAKE (1) TABLET TWICE A DAY WITH MEALS (BREAKFAST AND SUPPER)   diphenoxylate-atropine 2.5-0.025  MG tablet Commonly known as: Lomotil Take 2 tablets by mouth 4 (four) times daily as needed for diarrhea or loose stools.   furosemide 20 MG tablet Commonly known as: LASIX Take 1 tablet daily as needed.   imiquimod 5 % cream Commonly known as: ALDARA   levothyroxine 75 MCG tablet Commonly known as: SYNTHROID Take 1 tablet (75 mcg total) by mouth daily.   MUCINEX PO Take by mouth daily as needed.   multivitamin capsule Take 1 capsule daily by mouth.   nitroGLYCERIN 0.4 MG SL tablet Commonly known as: NITROSTAT PLACE 1 TABLET UNDER THE TONGUE AT ONSET OF CHEST PAIN EVERY 5 MINTUES UP TO 3 TIMES AS NEEDED FOR CHEST PAIN   VISBIOME PO Take 2 capsules by mouth 2 (two) times daily.      Decrease lomotil to 1-2 a day. Consider dc of carvedilol if heart rate continues in the 40s after decreaseing lomotil.   Follow-up: Return in about 6 weeks (around 09/21/2019).  Claretta Fraise, [name]

## 2019-08-11 LAB — TSH+FREE T4
Free T4: 1.37 ng/dL (ref 0.82–1.77)
TSH: 2.98 u[IU]/mL (ref 0.450–4.500)

## 2019-08-12 NOTE — Progress Notes (Signed)
Hello Frederick Davis,  Your lab result is normal and/or stable.Some minor variations that are not significant are commonly marked abnormal, but do not represent any medical problem for you.  Best regards, Zamzam Whinery, [name]

## 2019-08-23 ENCOUNTER — Other Ambulatory Visit: Payer: Self-pay | Admitting: Family Medicine

## 2019-08-24 DIAGNOSIS — Z09 Encounter for follow-up examination after completed treatment for conditions other than malignant neoplasm: Secondary | ICD-10-CM | POA: Diagnosis not present

## 2019-09-13 ENCOUNTER — Other Ambulatory Visit: Payer: Self-pay

## 2019-09-13 ENCOUNTER — Encounter: Payer: Self-pay | Admitting: Family Medicine

## 2019-09-13 ENCOUNTER — Ambulatory Visit (INDEPENDENT_AMBULATORY_CARE_PROVIDER_SITE_OTHER): Payer: Medicare Other | Admitting: Family Medicine

## 2019-09-13 VITALS — BP 138/62 | HR 63 | Temp 97.7°F | Ht 63.0 in | Wt 150.0 lb

## 2019-09-13 DIAGNOSIS — R1902 Left upper quadrant abdominal swelling, mass and lump: Secondary | ICD-10-CM

## 2019-09-13 DIAGNOSIS — R1907 Generalized intra-abdominal and pelvic swelling, mass and lump: Secondary | ICD-10-CM

## 2019-09-13 NOTE — Progress Notes (Signed)
Subjective: CC: Abdominal mass PCP: Claretta Fraise, MD [id number] Frederick Davis is a 85 y.o. male presenting to clinic today for:  1.  Abdominal mass Patient reports several day history of a left-sided abdominal mass that seems to be increasing in size.  He tells me that he is always had some fullness here but it is gotten much bigger.  Then he goes on to state that he never pays much attention to that area.  He does have a history of ostomy which was placed in 1986.  He does not regularly follow-up with a general surgeon for routine monitoring.  He notes that he "flushes" his ostomy every day with good output.  Denies any hematochezia or melena.  No nausea or vomiting.  P.o. intake is normal.  Denies any abdominal pain.  He constantly leaks urine after complications of previous urinary tract surgery.   ROS: Per HPI  Allergies  Allergen Reactions  . Lipitor [Atorvastatin] Other (See Comments)    Muscle aches   Past Medical History:  Diagnosis Date  . Anemia   . Aortic stenosis    a. mild-mod by TEE 11/2015.  . Arthritis    left knee   . CAD (coronary artery disease)    a. RCA thrombectomy 2014. b. CABGx3 in 11/2015 (EF 45-50% in 11/2015).  . Chronic renal insufficiency, stage III (moderate)    only has 0ne kidney  . Dyspnea    WITH EXERTION  . Esophageal stricture YRS AGO  . Hypertension   . Hypothyroidism   . Kidney stone 1969  . MI (myocardial infarction) ([place]) 2017  . Mitral regurgitation    a. mild by TEE 11/2015.  Marland Kitchen Myocardial infarction ([place]) 03/12/13   Inferior MI.  LAD mid 80% stenosis, circ mild plaque, RCA 100% with thrombectomy PTCA.  EF 65%  . Osteoporosis   . PC (prostate cancer) ([place]) 1987  . RBBB   . Rectum cancer ([place]) 1986  . Renal cell cancer ([place]) 2005    Current Outpatient Medications:  .  acetaminophen (TYLENOL) 500 MG tablet, Take 500 mg by mouth at bedtime., Disp: , Rfl:  .  amLODipine (NORVASC) 2.5 MG tablet, Take 2.5 mg by mouth daily., Disp: , Rfl:   .  aspirin 81 MG tablet, Take 1 tablet (81 mg total) by mouth daily. (Patient taking differently: Take 81 mg by mouth at bedtime. ), Disp: 30 tablet, Rfl:  .  carvedilol (COREG) 3.125 MG tablet, TAKE (1) TABLET TWICE A DAY WITH MEALS (BREAKFAST AND SUPPER), Disp: 60 tablet, Rfl: 1 .  diphenoxylate-atropine (LOMOTIL) 2.5-0.025 MG tablet, Take 2 tablets by mouth 4 (four) times daily as needed for diarrhea or loose stools., Disp: 30 tablet, Rfl: 1 .  furosemide (LASIX) 20 MG tablet, Take 1 tablet daily as needed., Disp: 30 tablet, Rfl: 6 .  guaiFENesin (MUCINEX PO), Take by mouth daily as needed. , Disp: , Rfl:  .  imiquimod (ALDARA) 5 % cream, , Disp: , Rfl:  .  levothyroxine (SYNTHROID) 75 MCG tablet, Take 1 tablet (75 mcg total) by mouth daily., Disp: 90 tablet, Rfl: 2 .  Multiple Vitamin (MULTIVITAMIN) capsule, Take 1 capsule daily by mouth., Disp: , Rfl:  .  nitroGLYCERIN (NITROSTAT) 0.4 MG SL tablet, PLACE 1 TABLET UNDER THE TONGUE AT ONSET OF CHEST PAIN EVERY 5 MINTUES UP TO 3 TIMES AS NEEDED FOR CHEST PAIN, Disp: 25 tablet, Rfl: 2 .  Probiotic Product (VISBIOME PO), Take 2 capsules by mouth 2 (two) times daily.,  Disp: , Rfl:  No current facility-administered medications for this visit.   Facility-Administered Medications Ordered in Other Visits:  .  etomidate (AMIDATE) injection, , , PRN, Terrill Mohr, CRNA, 10 mg at 03/12/13 1541 .  lidocaine (cardiac) 100 mg/67ml (XYLOCAINE) 20 MG/ML injection 2%, , , PRN, Terrill Mohr, CRNA, 50 mg at 12/08/15 0853 .  succinylcholine (ANECTINE) injection, , , PRN, Terrill Mohr, CRNA, 100 mg at 03/12/13 1541 Social History   Socioeconomic History  . Marital status: Married    Spouse name: Mikle Bosworth  . Number of children: Not on file  . Years of education: Not on file  . Highest education level: Professional school degree (e.g., MD, DDS, DVM, JD)  Occupational History  . Occupation: pharmacist-retired  . Occupation: preacher-retired  Social  Needs  . Financial resource strain: Not hard at all  . Food insecurity    Worry: Never true    Inability: Never true  . Transportation needs    Medical: No    Non-medical: No  Tobacco Use  . Smoking status: Former Smoker    Packs/day: 0.50    Years: 10.00    Pack years: 5.00    Types: Cigarettes    Start date: 10/21/1946    Quit date: 10/21/1968    Years since quitting: 50.9  . Smokeless tobacco: Never Used  Substance and Sexual Activity  . Alcohol use: Yes    Alcohol/week: 1.0 standard drinks    Types: 1 Standard drinks or equivalent per week    Frequency: Never    Comment: RARE  . Drug use: No  . Sexual activity: Not Currently  Lifestyle  . Physical activity    Days per week: 4 days    Minutes per session: 20 min  . Stress: Not at all  Relationships  . Social connections    Talks on phone: More than three times a week    Gets together: More than three times a week    Attends religious service: More than 4 times per year    Active member of club or organization: Yes    Attends meetings of clubs or organizations: More than 4 times per year    Relationship status: Married  . Intimate partner violence    Fear of current or ex partner: No    Emotionally abused: No    Physically abused: No    Forced sexual activity: No  Other Topics Concern  . Not on file  Social History Narrative  . Not on file   Family History  Problem Relation Age of Onset  . Stroke Mother 58  . AAA (abdominal aortic aneurysm) Father 57       died from rupture at 42 yo  . Arthritis Father   . Parkinson's disease Sister   . Cancer Son        prostate cancer  . Hypertension Son     Objective: Office vital signs reviewed. BP 138/62   Pulse 63   Temp 97.7 F (36.5 C) (Temporal)   Ht 5\' 3"  (1.6 m)   Wt 150 lb (68 kg)   BMI 26.57 kg/m   Physical Examination:  General: Awake, alert, No acute distress HEENT: Sclera white GI: soft, non-tender, he has a softball sized mass noted in the left  side of the abdomen just under his ostomy bag.  This is hard and irregular feeling when he is examined standing up.  When he is examined in the supine position this reduces and  becomes much softer.  He has no tenderness palpation over this area.   Assessment/ Plan: 85 y.o. male   1. Abdominal mass, LUQ (left upper quadrant) I have a high suspicion that this is an incisional hernia complicated by ostomy.  Lesion does reduce on exam.  I have ordered CT withOUT contrast due to CKD.  Will plan for referral to Gen surgery pending Ct result.  Plan for [facility] in [place].  We discussed red flag signs and symptoms warranting further evaluation emergency department.  Both he and his wife was good understanding. - CT Abdomen Pelvis Wo Contrast; Future  2. Generalized intra-abdominal and pelvic swelling, mass and lump - CT Abdomen Pelvis Wo Contrast; Future   Orders Placed This Encounter  Procedures  . CT Abdomen Pelvis Wo Contrast    Standing Status:   Future    Standing Expiration Date:   12/13/2020    Order Specific Question:   ** REASON FOR EXAM (FREE TEXT)    Answer:   suspect hernia but patient w/ CKD so CT w/ contrast not ordered.    Order Specific Question:   Preferred imaging location?    Answer:   [facility]    Order Specific Question:   Is Oral Contrast requested for this exam?    Answer:   Yes, Per Radiology protocol    Order Specific Question:   Radiology Contrast Protocol - do NOT remove file path    Answer:   [email or web address]   No orders of the defined types were placed in this encounter.    Janora Norlander, DO [place] [phone]

## 2019-09-13 NOTE — Patient Instructions (Signed)
I believe this is an incisional hernia complicated by your ostomy.  I have ordered a CT abd/pelvis without contrast (ideally would use with contrast but we are limited by your kidney disease).  I will refer you to general surgery as soon as we get this result back.   Ventral Hernia  A ventral hernia is a bulge of tissue from inside the abdomen that pushes through a weak area of the muscles that form the front wall of the abdomen. The tissues inside the abdomen are inside a sac (peritoneum). These tissues include the small intestine, large intestine, and the fatty tissue that covers the intestines (omentum). Sometimes, the bulge that forms a hernia contains intestines. Other hernias contain only fat. Ventral hernias do not go away without surgical treatment. There are several types of ventral hernias. You may have:  A hernia at an incision site from previous abdominal surgery (incisional hernia).  A hernia just above the belly button (epigastric hernia), or at the belly button (umbilical hernia). These types of hernias can develop from heavy lifting or straining.  A hernia that comes and goes (reducible hernia). It may be visible only when you lift or strain. This type of hernia can be pushed back into the abdomen (reduced).  A hernia that traps abdominal tissue inside the hernia (incarcerated hernia). This type of hernia does not reduce.  A hernia that cuts off blood flow to the tissues inside the hernia (strangulated hernia). The tissues can start to die if this happens. This is a very painful bulge that cannot be reduced. A strangulated hernia is a medical emergency. What are the causes? This condition is caused by abdominal tissue putting pressure on an area of weakness in the abdominal muscles. What increases the risk? The following factors may make you more likely to develop this condition:  Being male.  Being 47 or older.  Being overweight or obese.  Having had previous abdominal  surgery, especially if there was an infection after surgery.  Having had an injury to the abdominal wall.  Having had several pregnancies.  Having a buildup of fluid inside the abdomen (ascites). What are the signs or symptoms? The only symptom of a ventral hernia may be a painless bulge in the abdomen. A reducible hernia may be visible only when you strain, cough, or lift. Other symptoms may include:  Dull pain.  A feeling of pressure. Signs and symptoms of a strangulated hernia may include:  Increasing pain.  Nausea and vomiting.  Pain when pressing on the hernia.  The skin over the hernia turning red or purple.  Constipation.  Blood in the stool (feces). How is this diagnosed? This condition may be diagnosed based on:  Your symptoms.  Your medical history.  A physical exam. You may be asked to cough or strain while standing. These actions increase the pressure inside your abdomen and force the hernia through the opening in your muscles. Your health care provider may try to reduce the hernia by pressing on it.  Imaging studies, such as an ultrasound or CT scan. How is this treated? This condition is treated with surgery. If you have a strangulated hernia, surgery is done as soon as possible. If your hernia is small and not incarcerated, you may be asked to lose some weight before surgery. Follow these instructions at home:  Follow instructions from your health care provider about eating or drinking restrictions.  If you are overweight, your health care provider may recommend that you increase  your activity level and eat a healthier diet.  Do not lift anything that is heavier than 10 lb (4.5 kg).  Return to your normal activities as told by your health care provider. Ask your health care provider what activities are safe for you. You may need to avoid activities that increase pressure on your hernia.  Take over-the-counter and prescription medicines only as told by  your health care provider.  Keep all follow-up visits as told by your health care provider. This is important. Contact a health care provider if:  Your hernia gets larger.  Your hernia becomes painful. Get help right away if:  Your hernia becomes increasingly painful.  You have pain along with any of the following: ? Changes in skin color in the area of the hernia. ? Nausea. ? Vomiting. ? Fever. Summary  A ventral hernia is a bulge of tissue from inside the abdomen that pushes through a weak area of the muscles that form the front wall of the abdomen.  This condition is treated with surgery, which may be urgent depending on your hernia.  Do not lift anything that is heavier than 10 lb (4.5 kg), and follow activity instructions from your health care provider. This information is not intended to replace advice given to you by your health care provider. Make sure you discuss any questions you have with your health care provider. Document Released: 09/23/2012 Document Revised: 11/19/2017 Document Reviewed: 04/28/2017 Elsevier Patient Education  2020 [employer].

## 2019-09-21 ENCOUNTER — Other Ambulatory Visit: Payer: Self-pay

## 2019-09-22 ENCOUNTER — Ambulatory Visit: Payer: Medicare Other | Admitting: Family Medicine

## 2019-09-29 ENCOUNTER — Ambulatory Visit (HOSPITAL_COMMUNITY)
Admission: RE | Admit: 2019-09-29 | Discharge: 2019-09-29 | Disposition: A | Discharge status: Home / Self Care | Payer: Medicare Other | Source: Ambulatory Visit | Attending: Family Medicine | Admitting: Family Medicine

## 2019-09-29 ENCOUNTER — Telehealth: Payer: Self-pay | Admitting: Family Medicine

## 2019-09-29 ENCOUNTER — Other Ambulatory Visit: Payer: Self-pay | Admitting: Family Medicine

## 2019-09-29 ENCOUNTER — Ambulatory Visit: Payer: Medicare Other | Admitting: Family Medicine

## 2019-09-29 ENCOUNTER — Other Ambulatory Visit: Payer: Self-pay

## 2019-09-29 DIAGNOSIS — K432 Incisional hernia without obstruction or gangrene: Secondary | ICD-10-CM

## 2019-09-29 DIAGNOSIS — R1902 Left upper quadrant abdominal swelling, mass and lump: Secondary | ICD-10-CM | POA: Diagnosis not present

## 2019-09-29 DIAGNOSIS — R1907 Generalized intra-abdominal and pelvic swelling, mass and lump: Secondary | ICD-10-CM

## 2019-09-29 DIAGNOSIS — K449 Diaphragmatic hernia without obstruction or gangrene: Secondary | ICD-10-CM | POA: Diagnosis not present

## 2019-09-29 DIAGNOSIS — K8020 Calculus of gallbladder without cholecystitis without obstruction: Secondary | ICD-10-CM | POA: Diagnosis not present

## 2019-09-30 ENCOUNTER — Encounter: Payer: Self-pay | Admitting: Family Medicine

## 2019-09-30 ENCOUNTER — Ambulatory Visit (INDEPENDENT_AMBULATORY_CARE_PROVIDER_SITE_OTHER): Payer: Medicare Other | Admitting: Family Medicine

## 2019-09-30 VITALS — BP 107/69 | HR 65 | Temp 98.2°F | Ht 63.0 in | Wt 148.0 lb

## 2019-09-30 DIAGNOSIS — K8020 Calculus of gallbladder without cholecystitis without obstruction: Secondary | ICD-10-CM

## 2019-09-30 DIAGNOSIS — I2510 Atherosclerotic heart disease of native coronary artery without angina pectoris: Secondary | ICD-10-CM | POA: Diagnosis not present

## 2019-09-30 DIAGNOSIS — K8689 Other specified diseases of pancreas: Secondary | ICD-10-CM

## 2019-09-30 DIAGNOSIS — K863 Pseudocyst of pancreas: Secondary | ICD-10-CM | POA: Diagnosis not present

## 2019-09-30 DIAGNOSIS — K449 Diaphragmatic hernia without obstruction or gangrene: Secondary | ICD-10-CM | POA: Diagnosis not present

## 2019-09-30 NOTE — Progress Notes (Signed)
Subjective:  Patient ID: Frederick Davis, male    DOB: [birth year]  Age: 85 y.o. MRN: [id number]  CC: No chief complaint on file.   HPI [facility] presents for concern for a mass at the left upper quadrant. Afraid of cancer because hes had it four times. There is no pain, just a bulging. CT abd. Performed yesterday. Shws a loop of bowel in the opening of the ostomy. There are also gallstones without edema. He has a hiatal hernia noted as well, but denies heartburn. The main finding was several cysts on the pancreas, possible pseudocysts. He was recently seen by Dr. Earlean Shawl and released. This was before the cysts were found.   Depression screen [facility] 2/9 09/30/2019 09/13/2019 08/10/2019  Decreased Interest 0 0 0  Down, Depressed, Hopeless 0 0 0  PHQ - 2 Score 0 0 0  Altered sleeping 0 0 -  Tired, decreased energy 0 0 -  Change in appetite 0 0 -  Feeling bad or failure about yourself  0 0 -  Trouble concentrating 0 0 -  Moving slowly or fidgety/restless 0 0 -  Suicidal thoughts 0 0 -  PHQ-9 Score 0 0 -  Difficult doing work/chores - - -  Some recent data might be hidden    History Frederick Davis has a past medical history of Anemia, Aortic stenosis, Arthritis, CAD (coronary artery disease), Chronic renal insufficiency, stage III (moderate), Dyspnea, Esophageal stricture (YRS AGO), Hypertension, Hypothyroidism, Kidney stone (1969), MI (myocardial infarction) ([place]) (2017), Mitral regurgitation, Myocardial infarction ([place]) (03/12/13), Osteoporosis, PC (prostate cancer) ([place]) (1987), RBBB, Rectum cancer ([place]) (1986), and Renal cell cancer ([place]) (2005).   He has a past surgical history that includes Kidney surgery (Right, 2004); Radiofrequency ablation kidney (2006); renal sphincter appliance (2007); Colonoscopy (01/08/2012); Colostomy (Left, 1986); Percutaneous coronary intervention-balloon only; left heart catheterization with coronary angiogram (N/A, 03/12/2013); Prostatectomy; Colonoscopy (N/A,  07/20/2015); Cardiac catheterization (N/A, 12/06/2015); Coronary artery bypass graft (N/A, 12/08/2015); TEE without cardioversion (N/A, 12/08/2015); Hernia repair; Removal of penile prosthesis (N/A, 12/15/2017); and Removal of penile prosthesis (N/A, 02/23/2018).   His family history includes AAA (abdominal aortic aneurysm) (age of onset: 79) in his father; Arthritis in his father; Cancer in his son; Hypertension in his son; Parkinson's disease in his sister; Stroke (age of onset: 37) in his mother.He reports that he quit smoking about 50 years ago. His smoking use included cigarettes. He started smoking about 72 years ago. He has a 5.00 pack-year smoking history. He has never used smokeless tobacco. He reports current alcohol use of about 1.0 standard drinks of alcohol per week. He reports that he does not use drugs.    ROS Review of Systems  Objective:  BP 107/69   Pulse 65   Temp 98.2 F (36.8 C) (Temporal)   Ht 5\' 3"  (1.6 m)   Wt 148 lb (67.1 kg)   SpO2 98%   BMI 26.22 kg/m   BP Readings from Last 3 Encounters:  09/30/19 107/69  09/13/19 138/62  08/10/19 112/69    Wt Readings from Last 3 Encounters:  09/30/19 148 lb (67.1 kg)  09/13/19 150 lb (68 kg)  08/10/19 152 lb (68.9 kg)     Physical Exam    Assessment & Plan:   Diagnoses and all orders for this visit:  Pancreatic mass  Pseudocyst of pancreas  Gallstones  Hiatal hernia       I am having Frederick Davis. [facility] maintain his aspirin, acetaminophen, multivitamin, guaiFENesin (MUCINEX PO), nitroGLYCERIN,  furosemide, imiquimod, diphenoxylate-atropine, levothyroxine, Probiotic Product (VISBIOME PO), carvedilol, and amLODipine.  Allergies as of 09/30/2019      Reactions   Lipitor [atorvastatin] Other (See Comments)   Muscle aches      Medication List       Accurate as of September 30, 2019 11:59 PM. If you have any questions, ask your nurse or doctor.        acetaminophen 500 MG tablet Commonly known as:  TYLENOL Take 500 mg by mouth at bedtime.   amLODipine 2.5 MG tablet Commonly known as: NORVASC Take 2.5 mg by mouth daily.   aspirin 81 MG tablet Take 1 tablet (81 mg total) by mouth daily. What changed: when to take this   carvedilol 3.125 MG tablet Commonly known as: COREG TAKE (1) TABLET TWICE A DAY WITH MEALS (BREAKFAST AND SUPPER)   diphenoxylate-atropine 2.5-0.025 MG tablet Commonly known as: Lomotil Take 2 tablets by mouth 4 (four) times daily as needed for diarrhea or loose stools.   furosemide 20 MG tablet Commonly known as: LASIX Take 1 tablet daily as needed.   imiquimod 5 % cream Commonly known as: ALDARA   levothyroxine 75 MCG tablet Commonly known as: SYNTHROID Take 1 tablet (75 mcg total) by mouth daily.   MUCINEX PO Take by mouth daily as needed.   multivitamin capsule Take 1 capsule daily by mouth.   nitroGLYCERIN 0.4 MG SL tablet Commonly known as: NITROSTAT PLACE 1 TABLET UNDER THE TONGUE AT ONSET OF CHEST PAIN EVERY 5 MINTUES UP TO 3 TIMES AS NEEDED FOR CHEST PAIN   VISBIOME PO Take 2 capsules by mouth 2 (two) times daily.      Refer back to Dr. Earlean Shawl re. Pancreas findings. Reassured that the bowel loop "mass" is not cancerous.  Follow-up: Return in about 3 months (around 12/29/2019), or if symptoms worsen or fail to improve.  Claretta Fraise, [name]

## 2019-10-01 ENCOUNTER — Encounter: Payer: Self-pay | Admitting: Family Medicine

## 2019-10-25 ENCOUNTER — Other Ambulatory Visit: Payer: Self-pay | Admitting: Family Medicine

## 2019-10-25 ENCOUNTER — Other Ambulatory Visit: Payer: Self-pay | Admitting: Cardiology

## 2019-10-26 ENCOUNTER — Telehealth: Payer: Self-pay | Admitting: Family Medicine

## 2019-11-02 ENCOUNTER — Telehealth: Payer: Self-pay | Admitting: *Deleted

## 2019-11-02 NOTE — Telephone Encounter (Signed)
A message was left, re: his follow up visit. 

## 2019-11-22 DIAGNOSIS — Z7189 Other specified counseling: Secondary | ICD-10-CM | POA: Insufficient documentation

## 2019-11-22 NOTE — Progress Notes (Signed)
Cardiology Office Note   Date:  11/24/2019   ID:  Frederick Davis, DOB [birth year], MRN [id number]  PCP:  Claretta Fraise, MD  Cardiologist:   Minus Breeding, MD   Chief Complaint  Patient presents with  . Coronary Artery Disease      History of Present Illness: Frederick Davis is a 86 y.o. male who for followup after a acute inferior wall infarct in May of 2014. He had thrombectomy of a right coronary artery occlusion. He was initially acute respiratory failure and was cooled. However, he responded very well to therapy.   Last year he had chest pain.  He was sent for cardiac catheterization. This demonstrated severe LAD stenosis and RCA stenosis. He subsequently underwent a CABG 3 on February 2017.  Since I last saw him he has done relatively well particularly from a cardiovascular standpoint.  He has had no acute cardiovascular complaints such as chest pressure, neck or arm discomfort.  He has had no palpitations, presyncope or syncope.  He has had no weight gain or edema.     Past Medical History:  Diagnosis Date  . Anemia   . Aortic stenosis    a. mild-mod by TEE 11/2015.  . Arthritis    left knee   . CAD (coronary artery disease)    a. RCA thrombectomy 2014. b. CABGx3 in 11/2015 (EF 45-50% in 11/2015).  . Chronic renal insufficiency, stage III (moderate)    only has 0ne kidney  . Dyspnea    WITH EXERTION  . Esophageal stricture YRS AGO  . Hypertension   . Hypothyroidism   . Kidney stone 1969  . MI (myocardial infarction) ([place]) 2017  . Mitral regurgitation    a. mild by TEE 11/2015.  Marland Kitchen Myocardial infarction ([place]) 03/12/13   Inferior MI.  LAD mid 80% stenosis, circ mild plaque, RCA 100% with thrombectomy PTCA.  EF 65%  . Osteoporosis   . PC (prostate cancer) ([place]) 1987  . RBBB   . Rectum cancer ([place]) 1986  . Renal cell cancer ([place]) 2005    Past Surgical History:  Procedure Laterality Date  . CARDIAC CATHETERIZATION N/A 12/06/2015   Procedure: Left Heart Cath and  Coronary Angiography;  Surgeon: Burnell Blanks, MD;  Location: [facility];  Service: Cardiovascular;  Laterality: N/A;  . COLONOSCOPY  01/08/2012   Procedure: COLONOSCOPY;  Surgeon: Rogene Houston, MD;  [facility];  Service: Endoscopy;  Laterality: N/A;  1030  . COLONOSCOPY N/A 07/20/2015   Procedure: COLONOSCOPY;  Surgeon: Rogene Houston, MD;  [facility];  Service: Endoscopy;  Laterality: N/A;  930 - moved to 9/29 @ 2:00  . COLOSTOMY Left 1986   colon cancer  . CORONARY ARTERY BYPASS GRAFT N/A 12/08/2015   Procedure: CORONARY ARTERY BYPASS GRAFTING (CABG);  Surgeon: Melrose Nakayama, MD;  Location: [facility];  Service: Open Heart Surgery;  Laterality: N/A;  Times 3 using left internal mammary artery and endoscopycally harvested bilateral saphenous vein.  Marland Kitchen HERNIA REPAIR     2007 RIGHT INGUINAL HERNIA  . KIDNEY SURGERY Right 2004  . LEFT HEART CATHETERIZATION WITH CORONARY ANGIOGRAM N/A 03/12/2013   Procedure: STEMI;  Surgeon: Burnell Blanks, MD;  Location: [facility] C[facility];  Service: Cardiovascular;  Laterality: N/A;  . PERCUTANEOUS CORONARY INTERVENTION-BALLOON ONLY    . PROSTATECTOMY    . [place]  2006  . REMOVAL OF PENILE PROSTHESIS N/A 12/15/2017   Procedure: REMOVAL OF  PENILE PROSTHESIS;  Surgeon: Franchot Gallo, MD;  Location: [facility];  Service: Urology;  Laterality: N/A;  . REMOVAL OF PENILE PROSTHESIS N/A 02/23/2018   Procedure: EXPLANT OF ERODED SPHINCTER;  Surgeon: Franchot Gallo, MD;  Location: [facility];  Service: Urology;  Laterality: N/A;  . renal sphincter appliance  2007  . TEE WITHOUT CARDIOVERSION N/A 12/08/2015   Procedure: TRANSESOPHAGEAL ECHOCARDIOGRAM (TEE);  Surgeon: Melrose Nakayama, MD;  Location: [facility];  Service: Open Heart Surgery;  Laterality: N/A;   Prior to Admission medications   Medication Sig Start Date End Date Taking? Authorizing Provider  acetaminophen (TYLENOL) 500 MG tablet Take 500  mg by mouth at bedtime.   Yes [provider]  amLODipine (NORVASC) 2.5 MG tablet TAKE 1 TABLET DAILY 10/25/19  Yes Minus Breeding, MD  aspirin 81 MG tablet Take 1 tablet (81 mg total) by mouth daily. Patient taking differently: Take 81 mg by mouth at bedtime.  03/18/13  Yes Barrett, Evelene Croon, PA-C  carvedilol (COREG) 3.125 MG tablet TAKE (1) TABLET TWICE A DAY WITH MEALS (BREAKFAST AND SUPPER) 10/25/19  Yes Stacks, Cletus Gash, MD  furosemide (LASIX) 20 MG tablet Take 1 tablet daily as needed. 05/24/19  Yes Minus Breeding, MD  guaiFENesin (MUCINEX PO) Take by mouth daily as needed.    Yes [provider]  imiquimod (ALDARA) 5 % cream Apply topically at bedtime.  05/10/19  Yes [provider]  levothyroxine (SYNTHROID) 75 MCG tablet Take 1 tablet (75 mcg total) by mouth daily. 07/23/19  Yes Claretta Fraise, MD  lipase/protease/amylase (CREON) 36000 UNITS CPEP capsule Take 1 capsule by mouth at the first bite of each meal, and 1 capsule during meal, and 1 capsule at the last bite of a meal.  Allow for 3 meals per day.  If eating snacks take 1 capsule with snack. 11/02/19  Yes [provider]  Multiple Vitamin (MULTIVITAMIN) capsule Take 1 capsule daily by mouth.   Yes [provider]  nitroGLYCERIN (NITROSTAT) 0.4 MG SL tablet PLACE 1 TABLET UNDER THE TONGUE AT ONSET OF CHEST PAIN EVERY 5 MINTUES UP TO 3 TIMES AS NEEDED FOR CHEST PAIN 01/04/19  Yes Almyra Deforest, PA  Probiotic Product (VISBIOME PO) Take 2 capsules by mouth 2 (two) times daily.   Yes [provider]  diphenoxylate-atropine (LOMOTIL) 2.5-0.025 MG tablet Take 2 tablets by mouth 4 (four) times daily as needed for diarrhea or loose stools. Patient not taking: Reported on 11/24/2019 06/29/19   Claretta Fraise, MD      Allergies:   Lipitor [atorvastatin]    ROS:  Please see the history of present illness.   Otherwise, review of systems are positive for none.   All other systems are reviewed and negative.      PHYSICAL EXAM: VS:  BP 98/60   Pulse 72   Ht 5\' 3"  (1.6 m)   Wt 149 lb (67.6 kg)   BMI 26.39 kg/m  , BMI Body mass index is 26.39 kg/m. GENERAL:  Frail appearing NECK:  No jugular venous distention, waveform within normal limits, carotid upstroke brisk and symmetric, no bruits, no thyromegaly LUNGS:  Clear to auscultation bilaterally CHEST:  Unremarkable HEART:  PMI not displaced or sustained,S1 and S2 within normal limits, no S3, no S4, no clicks, no rubs, 3 out of 6 apical systolic murmur radiating slightly off aortic outflow tract, no diastolic murmurs murmurs ABD:  Flat, positive bowel sounds normal in frequency in pitch, no bruits, no rebound, no guarding, no  midline pulsatile mass, no hepatomegaly, no splenomegaly EXT:  2 plus pulses throughout, mild bilateral lower extremity nonpitting edema, no cyanosis no clubbing   EKG:  EKG is not ordered today.   Recent Labs: 05/27/2019: ALT 8; BUN 20; Creatinine, Ser 1.68; Hemoglobin 12.5; Platelets 242; Potassium 4.4; Sodium 138 08/10/2019: TSH 2.980    Lipid Panel    Component Value Date/Time   CHOL 155 03/24/2019 1416   CHOL 106 05/27/2013 0818   TRIG 149 03/24/2019 1416   TRIG 81 10/29/2016 0815   TRIG 76 05/27/2013 0818   HDL 64 03/24/2019 1416   HDL 54 10/29/2016 0815   HDL 62 05/27/2013 0818   CHOLHDL 2.4 03/24/2019 1416   CHOLHDL 2.8 10/18/2015 0530   VLDL 19 10/18/2015 0530   LDLCALC 61 03/24/2019 1416   LDLCALC 29 08/09/2014 0854   LDLCALC 29 05/27/2013 0818      Wt Readings from Last 3 Encounters:  11/24/19 149 lb (67.6 kg)  09/30/19 148 lb (67.1 kg)  09/13/19 150 lb (68 kg)      Other studies Reviewed: Additional studies/ records that were reviewed today include: None. Review of the above records demonstrates:  Please see elsewhere in the note.     ASSESSMENT AND PLAN:  CAD/CABG:     The patient has no new sypmtoms.  No further cardiovascular testing is indicated.  We will continue with  aggressive risk reduction and meds as listed.  HTN:   The blood pressure is actually running low.  However, at home he says it is in the 120s.  Let me know if it runs lower and I be happy to back off on the amlodipine.   DYSLIPIDEMIA:    LDL is 61.  No change in therapy.   AORTIC STENOSIS: This has been moderate.   No change in therapy.  COVID EDUCATION: He and his wife are very suspicious of the vaccine.  We discussed this at length.   Current medicines are reviewed at length with the patient today.  The patient does not have concerns regarding medicines.  The following changes have been made:  no change  Labs/ tests ordered today include: None No orders of the defined types were placed in this encounter.    Disposition:   FU with me in six months.     Signed, Minus Breeding, MD  11/24/2019 2:14 PM    [facility]

## 2019-11-24 ENCOUNTER — Other Ambulatory Visit: Payer: Self-pay

## 2019-11-24 ENCOUNTER — Ambulatory Visit (INDEPENDENT_AMBULATORY_CARE_PROVIDER_SITE_OTHER): Payer: Medicare Other | Admitting: Cardiology

## 2019-11-24 ENCOUNTER — Encounter: Payer: Self-pay | Admitting: Cardiology

## 2019-11-24 VITALS — BP 98/60 | HR 72 | Ht 63.0 in | Wt 149.0 lb

## 2019-11-24 DIAGNOSIS — Z7189 Other specified counseling: Secondary | ICD-10-CM

## 2019-11-24 DIAGNOSIS — I10 Essential (primary) hypertension: Secondary | ICD-10-CM

## 2019-11-24 DIAGNOSIS — I350 Nonrheumatic aortic (valve) stenosis: Secondary | ICD-10-CM

## 2019-11-24 DIAGNOSIS — E785 Hyperlipidemia, unspecified: Secondary | ICD-10-CM

## 2019-11-24 DIAGNOSIS — I2510 Atherosclerotic heart disease of native coronary artery without angina pectoris: Secondary | ICD-10-CM

## 2019-11-24 NOTE — Patient Instructions (Signed)
Medication Instructions:  The current medical regimen is effective;  continue present plan and medications.  *If you need a refill on your cardiac medications before your next appointment, please call your pharmacy*  Follow-Up: At [facility], you and your health needs are our priority.  As part of our continuing mission to provide you with exceptional heart care, we have created designated Provider Care Teams.  These Care Teams include your primary Cardiologist (physician) and Advanced Practice Providers (APPs -  Physician Assistants and Nurse Practitioners) who all work together to provide you with the care you need, when you need it.  Your next appointment:   6 month(s)  The format for your next appointment:   In Person  Provider:   James Hochrein, MD  Thank you for choosing [facility]!!     

## 2019-11-29 ENCOUNTER — Other Ambulatory Visit: Payer: Self-pay | Admitting: Family Medicine

## 2019-11-30 DIAGNOSIS — K8681 Exocrine pancreatic insufficiency: Secondary | ICD-10-CM | POA: Insufficient documentation

## 2019-11-30 DIAGNOSIS — K8020 Calculus of gallbladder without cholecystitis without obstruction: Secondary | ICD-10-CM | POA: Insufficient documentation

## 2019-12-27 ENCOUNTER — Other Ambulatory Visit: Payer: Self-pay | Admitting: Family Medicine

## 2019-12-29 ENCOUNTER — Other Ambulatory Visit: Payer: Self-pay

## 2019-12-30 ENCOUNTER — Ambulatory Visit (INDEPENDENT_AMBULATORY_CARE_PROVIDER_SITE_OTHER): Payer: Medicare Other | Admitting: Family Medicine

## 2019-12-30 ENCOUNTER — Encounter: Payer: Self-pay | Admitting: Family Medicine

## 2019-12-30 ENCOUNTER — Other Ambulatory Visit: Payer: Self-pay

## 2019-12-30 VITALS — BP 114/64 | HR 60 | Temp 97.3°F | Ht 63.0 in | Wt 151.8 lb

## 2019-12-30 DIAGNOSIS — I2510 Atherosclerotic heart disease of native coronary artery without angina pectoris: Secondary | ICD-10-CM

## 2019-12-30 DIAGNOSIS — I10 Essential (primary) hypertension: Secondary | ICD-10-CM

## 2019-12-30 DIAGNOSIS — K8681 Exocrine pancreatic insufficiency: Secondary | ICD-10-CM | POA: Diagnosis not present

## 2019-12-30 DIAGNOSIS — E039 Hypothyroidism, unspecified: Secondary | ICD-10-CM | POA: Diagnosis not present

## 2019-12-30 MED ORDER — CARVEDILOL 3.125 MG PO TABS: ORAL_TABLET | ORAL | 1 refills | Status: DC

## 2019-12-30 NOTE — Progress Notes (Signed)
Subjective:  Patient ID: Frederick Davis, male    DOB: [birth year]  Age: 86 y.o. MRN: [id number]  CC: Follow-up (3 month)   HPI Frederick Davis presents for 48-monthfollow-up of his routine care.  During this time he was diagnosed with pancreatic cysts and started on Creon.  He says his co-pay is $100 a month but it is well worth it because his gastrointestinal discomfort has completely resolved.  He wants to try to resume lactose.  Dr. MThana Farrhad taken him off of lactose prior to his pancreas diagnosis.  Since starting Creon and finding that it helped but stopping lactose had not he like to go back on lactose-containing foods.  He does say that he is feeling sluggish and had noted that his systolic blood pressure has been in the 90s at home.  He just feels tired.  He is also getting more forgetful says he and his wife.  He forgets things more than usual its common objects such as the car keys etc.  There is concern for some possible onset of dementia.  He continues to have some leg edema particularly in the ankles.  Denies shortness of breath.  Patient presents for follow-up on  thyroid. The patient has a history of hypothyroidism for many years. It has been stable recently. Pt. denies any change in  voice, loss of hair, heat or cold intolerance. Energy level has been somewhat diminished. Patient denies constipation and diarrhea. No myxedema. Medication is as noted below. Verified that pt is taking it daily on an empty stomach. Well tolerated.  [facility] 12/30/2019 09/30/2019 09/13/2019  Decreased Interest 0 0 0  Down, Depressed, Hopeless 0 0 0  PHQ - 2 Score 0 0 0  Altered sleeping - 0 0  Tired, decreased energy - 0 0  Change in appetite - 0 0  Feeling bad or failure about yourself  - 0 0  Trouble concentrating - 0 0  Moving slowly or fidgety/restless - 0 0  Suicidal thoughts - 0 0  PHQ-9 Score - 0 0  Difficult doing work/chores - - -  Some recent data might be hidden     History HAjahas a past medical history of Anemia, Aortic stenosis, Arthritis, CAD (coronary artery disease), Chronic renal insufficiency, stage III (moderate), Dyspnea, Esophageal stricture (YRS AGO), Hypertension, Hypothyroidism, Kidney stone (1969), [place] [facility] [place]), Mitral regurgitation, [place]ocardial infarction ([facility] (03/12/13), Osteoporosis, PC (prostate cancer) ([facility] ([place]), RBBB, Rectum cancer [place] ([place]), and Renal cell cancer ([facility] ([place]).   He has a past surgical history that includes Kidney surgery (Right, 2004); Radiofrequency ablation kidney (2006); renal sphincter appliance (2007); Colonoscopy (01/08/2012); Colostomy (Left, 1986); Percutaneous coronary intervention-balloon only; left heart catheterization with coronary angiogram (N/A, 03/12/2013); Prostatectomy; Colonoscopy (N/A, 07/20/2015); Cardiac catheterization (N/A, 12/06/2015); Coronary artery bypass graft (N/A, 12/08/2015); TEE without cardioversion (N/A, 12/08/2015); Hernia repair; Removal of penile prosthesis (N/A, 12/15/2017); and Removal of penile prosthesis (N/A, 02/23/2018).   His family history includes AAA (abdominal aortic aneurysm) (age of onset: 770 in his father; Arthritis in his father; Cancer in his son; Hypertension in his son; Parkinson's disease in his sister; Stroke (age of onset: 848 in his mother.He reports that he quit smoking about 51 years ago. His smoking use included cigarettes. He started smoking about 73 years ago. He has a 5.00 pack-year smoking history. He has never used smokeless tobacco. He reports current alcohol use of about 1.0 standard drinks of alcohol per week. He reports  that he does not use drugs.    ROS Review of Systems  Constitutional: Negative.   HENT: Negative.   Eyes: Negative for visual disturbance.  Respiratory: Negative for cough and shortness of breath.   Cardiovascular: Negative for chest pain and leg swelling.  Gastrointestinal: Negative for abdominal pain,  diarrhea, nausea and vomiting.  Musculoskeletal: Negative for arthralgias and myalgias.  Skin: Negative for rash.  Neurological: Negative for headaches.  Psychiatric/Behavioral: Negative for sleep disturbance.    Objective:  BP 114/64   Pulse 60   Temp (!) 97.3 F (36.3 C) (Temporal)   Ht '5\' 3"'  (1.6 m)   Wt 151 lb 12.8 oz (68.9 kg)   BMI 26.89 kg/m   BP Readings from Last 3 Encounters:  12/30/19 114/64  11/24/19 98/60  09/30/19 107/69    Wt Readings from Last 3 Encounters:  12/30/19 151 lb 12.8 oz (68.9 kg)  11/24/19 149 lb (67.6 kg)  09/30/19 148 lb (67.1 kg)     Physical Exam Vitals reviewed.  Constitutional:      Appearance: He is well-developed.  HENT:     Head: Normocephalic and atraumatic.     Right Ear: Tympanic membrane and external ear normal. No decreased hearing noted.     Left Ear: Tympanic membrane and external ear normal. No decreased hearing noted.     Mouth/Throat:     Pharynx: No oropharyngeal exudate or posterior oropharyngeal erythema.  Eyes:     Pupils: Pupils are equal, round, and reactive to light.  Cardiovascular:     Rate and Rhythm: Normal rate and regular rhythm.     Heart sounds: No murmur.  Pulmonary:     Effort: No respiratory distress.     Breath sounds: Normal breath sounds.  Abdominal:     General: Bowel sounds are normal.     Palpations: Abdomen is soft. There is no mass.     Tenderness: There is no abdominal tenderness.  Musculoskeletal:     Cervical back: Normal range of motion and neck supple.       Assessment & Plan:   Frederick Davis was seen today for follow-up.  Diagnoses and all orders for this visit:  Coronary artery disease involving native coronary artery of native heart without angina pectoris -     CBC with Differential/Platelet -     CMP14+EGFR -     TSH + free T4  ASCVD (arteriosclerotic cardiovascular disease) -     CBC with Differential/Platelet -     CMP14+EGFR -     TSH + free T4  Essential  hypertension, benign -     CBC with Differential/Platelet -     CMP14+EGFR -     TSH + free T4  Hypothyroidism, unspecified type -     CBC with Differential/Platelet -     CMP14+EGFR -     TSH + free T4  Exocrine pancreatic insufficiency  Other orders -     carvedilol (COREG) 3.125 MG tablet; TAKE (1) TABLET TWICE A DAY WITH MEALS (BREAKFAST AND SUPPER)    We discussed that his lack of energy could be related to his thyroid or to his low systolic blood pressures.  At this time we will go ahead with thyroid function testing but also he just does not seem to need the amlodipine anymore.  The edema in his feet may be improved by discontinuing the medication as well.  We will monitor his blood pressure of course he continues to do that at home  on a regular basis and will let me know if it goes up too high.  Since he has had coronary artery disease with a bypass just 3-1/2 years ago I decided that we should not stop the Coreg.  This is simply due to the recent problems with the heart.  However, it is likely that at least part of his sluggish feeling is coming from the use of beta-blocker.  Hopefully we can compensate through the other steps including discontinuing the amlodipine and maximizing the thyroid.  As a result of his resolution of symptoms with the addition of Creon, I told him he can slowly add back some like lactose-containing foods and see how he does with them.  1 condition does not exclude the other and it may be that his improvement came about because of excluding the lactose as well as adding the Creon however that seems less likely than pancreatic insufficiency as the sole cause of his GI discomfort.  Therefore he is going to add back perhaps a small amount of cheese or milk or yogurt in the meantime as he tolerates it well he can increase further.   I have discontinued [name]. Lawless's guaiFENesin (MUCINEX PO), imiquimod, diphenoxylate-atropine, and amLODipine. I am also having  him maintain his aspirin, acetaminophen, multivitamin, nitroGLYCERIN, furosemide, levothyroxine, Probiotic Product (VISBIOME PO), lipase/protease/amylase, and carvedilol.  Allergies as of 12/30/2019      Reactions   Lipitor [atorvastatin] Other (See Comments)   Muscle aches      Medication List       Accurate as of December 30, 2019 11:59 PM. If you have any questions, ask your nurse or doctor.        STOP taking these medications   amLODipine 2.5 MG tablet Commonly known as: NORVASC Stopped by: Claretta Fraise, MD   diphenoxylate-atropine 2.5-0.025 MG tablet Commonly known as: Lomotil Stopped by: Claretta Fraise, MD   imiquimod 5 % cream Commonly known as: ALDARA Stopped by: Claretta Fraise, MD   Warden by: Claretta Fraise, MD     TAKE these medications   acetaminophen 500 MG tablet Commonly known as: TYLENOL Take 500 mg by mouth at bedtime.   aspirin 81 MG tablet Take 1 tablet (81 mg total) by mouth daily. What changed: when to take this   carvedilol 3.125 MG tablet Commonly known as: COREG TAKE (1) TABLET TWICE A DAY WITH MEALS (BREAKFAST AND SUPPER)   Creon 36000 UNITS Cpep capsule Generic drug: lipase/protease/amylase Take 1 capsule by mouth at the first bite of each meal, and 1 capsule during meal, and 1 capsule at the last bite of a meal.  Allow for 3 meals per day.  If eating snacks take 1 capsule with snack.   furosemide 20 MG tablet Commonly known as: LASIX Take 1 tablet daily as needed.   levothyroxine 75 MCG tablet Commonly known as: SYNTHROID Take 1 tablet (75 mcg total) by mouth daily.   multivitamin capsule Take 1 capsule daily by mouth.   nitroGLYCERIN 0.4 MG SL tablet Commonly known as: NITROSTAT PLACE 1 TABLET UNDER THE TONGUE AT ONSET OF CHEST PAIN EVERY 5 MINTUES UP TO 3 TIMES AS NEEDED FOR CHEST PAIN   VISBIOME PO Take 2 capsules by mouth 2 (two) times daily.        Follow-up: Return in about 3 months (around  03/31/2020).  Claretta Fraise, [name]

## 2019-12-31 LAB — CMP14+EGFR
ALT: 14 IU/L (ref 0–44)
AST: 20 IU/L (ref 0–40)
Albumin/Globulin Ratio: 2.2 (ref 1.2–2.2)
Albumin: 4.2 g/dL (ref 3.5–4.6)
Alkaline Phosphatase: 86 IU/L (ref 39–117)
BUN/Creatinine Ratio: 19 (ref 10–24)
BUN: 29 mg/dL (ref 10–36)
Bilirubin Total: 0.3 mg/dL (ref 0.0–1.2)
CO2: 21 mmol/L (ref 20–29)
Calcium: 9.8 mg/dL (ref 8.6–10.2)
Chloride: 102 mmol/L (ref 96–106)
Creatinine, Ser: 1.52 mg/dL — ABNORMAL HIGH (ref 0.76–1.27)
GFR calc Af Amer: 44 mL/min/{1.73_m2} — ABNORMAL LOW (ref >59)
GFR calc non Af Amer: 38 mL/min/{1.73_m2} — ABNORMAL LOW (ref >59)
Globulin, Total: 1.9 g/dL (ref 1.5–4.5)
Glucose: 99 mg/dL (ref 65–99)
Potassium: 4.5 mmol/L (ref 3.5–5.2)
Sodium: 137 mmol/L (ref 134–144)
Total Protein: 6.1 g/dL (ref 6.0–8.5)

## 2019-12-31 LAB — CBC WITH DIFFERENTIAL/PLATELET
Basophils Absolute: 0.1 10*3/uL (ref 0.0–0.2)
Basos: 1 %
EOS (ABSOLUTE): 0.6 10*3/uL — ABNORMAL HIGH (ref 0.0–0.4)
Eos: 6 %
Hematocrit: 34.9 % — ABNORMAL LOW (ref 37.5–51.0)
Hemoglobin: 11.5 g/dL — ABNORMAL LOW (ref 13.0–17.7)
Immature Grans (Abs): 0 10*3/uL (ref 0.0–0.1)
Immature Granulocytes: 0 %
Lymphocytes Absolute: 1.6 10*3/uL (ref 0.7–3.1)
Lymphs: 16 %
MCH: 28.8 pg (ref 26.6–33.0)
MCHC: 33 g/dL (ref 31.5–35.7)
MCV: 88 fL (ref 79–97)
Monocytes Absolute: 0.7 10*3/uL (ref 0.1–0.9)
Monocytes: 7 %
Neutrophils Absolute: 7.1 10*3/uL — ABNORMAL HIGH (ref 1.4–7.0)
Neutrophils: 70 %
Platelets: 234 10*3/uL (ref 150–450)
RBC: 3.99 x10E6/uL — ABNORMAL LOW (ref 4.14–5.80)
RDW: 13.5 % (ref 11.6–15.4)
WBC: 10 10*3/uL (ref 3.4–10.8)

## 2019-12-31 LAB — TSH+FREE T4
Free T4: 1.2 ng/dL (ref 0.82–1.77)
TSH: 2.95 u[IU]/mL (ref 0.450–4.500)

## 2019-12-31 NOTE — Progress Notes (Signed)
Hello Jheremy,  Your lab result is normal and/or stable.Some minor variations that are not significant are commonly marked abnormal, but do not represent any medical problem for you.  Best regards, Kyliana Standen, [name]

## 2020-01-07 ENCOUNTER — Telehealth: Payer: Self-pay | Admitting: Cardiology

## 2020-01-07 ENCOUNTER — Telehealth: Payer: Self-pay | Admitting: Family Medicine

## 2020-01-07 NOTE — Telephone Encounter (Signed)
The referral was made by Dr. Lajuana Ripple back in December for an incisional hernia. I agree that he doesn't have to go if he doesn't want to. WS

## 2020-01-07 NOTE — Telephone Encounter (Signed)
STAT if HR is under 50 or over 120 (normal HR is 60-100 beats per minute)  1) What is your heart rate? 40  2) Do you have a log of your heart rate readings (document readings)? 53, 40,48,48,34,38,38  3) Do you have any other symptoms? In general today he is not feeling well today.

## 2020-01-07 NOTE — Telephone Encounter (Signed)
Pt called back-he states that he is not feeling "right". He states that his BP's are 114/56 HR 53; 117/71 HR 48; 140/80 HR 48. He denies any CP or pressure, no pressure, dizziness, SOB or headache. He states that he is taking all medication as ordered. He does state that he is taking Norvasc. I do not see this on his medication list as Dr Percival Spanish told him (per progress note) that he could stop this if his BP/HR becomes low. He states that he does not remember this and will d/c this tomorrow. He will continue to take his BP TID. Verbalizes understanding. He will continue to take and log BP/HR TID. He will CB if HR doesn't go up over the weekend. He will CB if any adverse sx occur. Pt will continue all other medications on his medication list.

## 2020-01-07 NOTE — Telephone Encounter (Signed)
[place] from [facility] called stating that she tried calling pt to schedule appt and was told by pt that he didn't understand why we were sending him there.  Called pt and spoke with pt's wife regarding referral to [facility]. Pt and wife do not know why we referred pt there because Dr Livia Snellen had already referred pt to see Dr Earlean Shawl (Gastroenterologist) in [place] and after a CT scan was performed, it was determined by Dr Earlean Shawl that pt's pancrease was not secreting enough enzymes to digest his food. Dr Earlean Shawl prescribed pt Creon and a probiotic and told pt that he would NOT recommend that pt have surgery.  Called [place] back at [facility] to give her this info.

## 2020-01-11 ENCOUNTER — Telehealth: Payer: Self-pay | Admitting: Family Medicine

## 2020-01-11 NOTE — Chronic Care Management (AMB) (Signed)
  Chronic Care Management   Note  01/11/2020 Name: Frederick Davis MRN: [id number] DOB: 1923-09-17  Frederick Davis is a 86 y.o. year old male who is a primary care patient of Claretta Fraise, MD. I reached out to [name] by phone today in response to a referral sent by [name] health plan.     Frederick Davis wife Frederick Davis was given information about Chronic Care Management services today including:  1. CCM service includes personalized support from designated clinical staff supervised by his physician, including individualized plan of care and coordination with other care providers 2. 24/7 contact phone numbers for assistance for urgent and routine care needs. 3. Service will only be billed when office clinical staff spend 20 minutes or more in a month to coordinate care. 4. Only one practitioner may furnish and bill the service in a calendar month. 5. The patient may stop CCM services at any time (effective at the end of the month) by phone call to the office staff. 6. The patient will be responsible for cost sharing (co-pay) of up to 20% of the service fee (after annual deductible is met).  Patient's wife Frederick Davis agreed to services and verbal consent obtained.   Follow up plan: Telephone appointment with care management team member scheduled for: 06/22/2020  Noreene Larsson, [place], [place], [place] [address] Direct Dial: [phone] [email or web address] Website: [email or web address]

## 2020-02-28 ENCOUNTER — Telehealth: Payer: Self-pay | Admitting: Family Medicine

## 2020-02-28 DIAGNOSIS — Z933 Colostomy status: Secondary | ICD-10-CM

## 2020-02-28 NOTE — Telephone Encounter (Signed)
Pt called stating that he spoke with someone from [place] regarding wafter for is ostomy and was told that he needed to request order from PCP. Pt says he has 1 left which lasts him about 6 days. Phone # to [facility] is [phone]

## 2020-02-29 ENCOUNTER — Other Ambulatory Visit: Payer: Self-pay | Admitting: *Deleted

## 2020-02-29 NOTE — Telephone Encounter (Signed)
error 

## 2020-02-29 NOTE — Telephone Encounter (Signed)
Order printed / signed and faxed  Pt aware

## 2020-04-04 ENCOUNTER — Ambulatory Visit (INDEPENDENT_AMBULATORY_CARE_PROVIDER_SITE_OTHER): Payer: Medicare Other | Admitting: Family Medicine

## 2020-04-04 ENCOUNTER — Other Ambulatory Visit: Payer: Self-pay

## 2020-04-04 ENCOUNTER — Encounter: Payer: Self-pay | Admitting: Family Medicine

## 2020-04-04 VITALS — BP 155/89 | HR 56 | Temp 96.9°F | Ht 63.0 in | Wt 151.4 lb

## 2020-04-04 DIAGNOSIS — I2510 Atherosclerotic heart disease of native coronary artery without angina pectoris: Secondary | ICD-10-CM | POA: Diagnosis not present

## 2020-04-04 DIAGNOSIS — E039 Hypothyroidism, unspecified: Secondary | ICD-10-CM

## 2020-04-04 DIAGNOSIS — I442 Atrioventricular block, complete: Secondary | ICD-10-CM

## 2020-04-04 DIAGNOSIS — Z933 Colostomy status: Secondary | ICD-10-CM

## 2020-04-04 DIAGNOSIS — Z951 Presence of aortocoronary bypass graft: Secondary | ICD-10-CM | POA: Diagnosis not present

## 2020-04-04 DIAGNOSIS — E782 Mixed hyperlipidemia: Secondary | ICD-10-CM

## 2020-04-04 MED ORDER — FUROSEMIDE 20 MG PO TABS: 20.0000 mg | ORAL_TABLET | Freq: Every day | ORAL | 6 refills | Status: DC | PRN

## 2020-04-04 MED ORDER — LEVOTHYROXINE SODIUM 75 MCG PO TABS: 75.0000 ug | ORAL_TABLET | Freq: Every day | ORAL | 2 refills | Status: DC

## 2020-04-04 NOTE — Progress Notes (Signed)
Subjective:  Patient ID: Frederick Davis, male    DOB: [birth year]  Age: 86 y.o. MRN: [id number]  CC: Follow-up (3 month)   HPI Frederick Davis presents for patient presents for follow-up on  thyroid. The patient has a history of hypothyroidism for many years. It has been stable recently. Pt. denies any change in  voice, loss of hair, heat or cold intolerance. Energy level has been adequate to good. Patient denies constipation and diarrhea. No myxedema. Medication is as noted below. Verified that pt is taking it daily on an empty stomach. Well tolerated.  Patient in for follow-up of elevated cholesterol. Doing well without complaints on current medication. Denies side effects of statin including myalgia and arthralgia and nausea. Also in today for liver function testing. Currently no chest pain, shortness of breath or other cardiovascular related symptoms noted.  Patient denies any symptoms related to heart block including syncope palpitations and chest pain.  Patient continues to have some GI distress periodically.  This will require him to irrigate his colostomy more frequently on occasion.  Depression screen [facility] 2/9 04/04/2020 12/30/2019 09/30/2019  Decreased Interest 0 0 0  Down, Depressed, Hopeless 0 0 0  PHQ - 2 Score 0 0 0  Altered sleeping - - 0  Tired, decreased energy - - 0  Change in appetite - - 0  Feeling bad or failure about yourself  - - 0  Trouble concentrating - - 0  Moving slowly or fidgety/restless - - 0  Suicidal thoughts - - 0  PHQ-9 Score - - 0  Difficult doing work/chores - - -  Some recent data might be hidden    History Frederick Davis has a past medical history of Anemia, Aortic stenosis, Arthritis, CAD (coronary artery disease), Chronic renal insufficiency, stage III (moderate), Dyspnea, Esophageal stricture (YRS AGO), Hypertension, Hypothyroidism, Kidney stone (1969), MI (myocardial infarction) ([place]) (2017), Mitral regurgitation, Myocardial infarction ([place])  (03/12/13), Osteoporosis, PC (prostate cancer) ([place]) (1987), RBBB, Rectum cancer ([place]) (1986), and Renal cell cancer ([place]) (2005).   He has a past surgical history that includes Kidney surgery (Right, 2004); Radiofrequency ablation kidney (2006); renal sphincter appliance (2007); Colonoscopy (01/08/2012); Colostomy (Left, 1986); Percutaneous coronary intervention-balloon only; left heart catheterization with coronary angiogram (N/A, 03/12/2013); Prostatectomy; Colonoscopy (N/A, 07/20/2015); Cardiac catheterization (N/A, 12/06/2015); Coronary artery bypass graft (N/A, 12/08/2015); TEE without cardioversion (N/A, 12/08/2015); Hernia repair; Removal of penile prosthesis (N/A, 12/15/2017); and Removal of penile prosthesis (N/A, 02/23/2018).   His family history includes AAA (abdominal aortic aneurysm) (age of onset: 70) in his father; Arthritis in his father; Cancer in his son; Hypertension in his son; Parkinson's disease in his sister; Stroke (age of onset: 38) in his mother.He reports that he quit smoking about 51 years ago. His smoking use included cigarettes. He started smoking about 73 years ago. He has a 5.00 pack-year smoking history. He has never used smokeless tobacco. He reports current alcohol use of about 1.0 standard drink of alcohol per week. He reports that he does not use drugs.    ROS Review of Systems  Constitutional: Negative.   HENT: Negative.   Eyes: Negative for visual disturbance.  Respiratory: Negative for cough and shortness of breath.   Cardiovascular: Negative for chest pain and leg swelling.  Gastrointestinal: Positive for abdominal pain (Periodically related to the use of the colostomy bags and occasional frequency of bowel movement). Negative for diarrhea, nausea and vomiting.  Genitourinary: Negative for difficulty urinating.  Musculoskeletal: Negative for arthralgias and myalgias.  Skin: Negative for rash.  Neurological: Negative for headaches.  Psychiatric/Behavioral: Negative  for sleep disturbance.    Objective:  BP (!) 155/89   Pulse (!) 56   Temp (!) 96.9 F (36.1 C) (Temporal)   Ht '5\' 3"'$  (1.6 m)   Wt 151 lb 6.4 oz (68.7 kg)   BMI 26.82 kg/m   BP Readings from Last 3 Encounters:  04/04/20 (!) 155/89  12/30/19 114/64  11/24/19 98/60    Wt Readings from Last 3 Encounters:  04/04/20 151 lb 6.4 oz (68.7 kg)  12/30/19 151 lb 12.8 oz (68.9 kg)  11/24/19 149 lb (67.6 kg)     Physical Exam Vitals reviewed.  Constitutional:      Appearance: He is well-developed.  HENT:     Head: Normocephalic and atraumatic.     Right Ear: Tympanic membrane and external ear normal. No decreased hearing noted.     Left Ear: Tympanic membrane and external ear normal. No decreased hearing noted.     Mouth/Throat:     Pharynx: No oropharyngeal exudate or posterior oropharyngeal erythema.  Eyes:     Pupils: Pupils are equal, round, and reactive to light.  Cardiovascular:     Rate and Rhythm: Normal rate and regular rhythm.     Heart sounds: No murmur heard.   Pulmonary:     Effort: No respiratory distress.     Breath sounds: Normal breath sounds.  Abdominal:     General: Bowel sounds are normal.     Palpations: Abdomen is soft. There is no mass.     Tenderness: There is no abdominal tenderness.     Comments: Colostomy in place is unremarkable  Musculoskeletal:     Cervical back: Normal range of motion and neck supple.       Assessment & Plan:   Frederick Davis was seen today for follow-up.  Diagnoses and all orders for this visit:  Hypothyroidism, unspecified type -     CBC with Differential/Platelet -     CMP14+EGFR -     TSH + free T4 -     levothyroxine (SYNTHROID) 75 MCG tablet; Take 1 tablet (75 mcg total) by mouth daily.  S/P CABG x 3 -     CBC with Differential/Platelet -     CMP14+EGFR  Complete heart block (HCC) -     CBC with Differential/Platelet -     CMP14+EGFR  Colostomy in place (HCC) -     CBC with Differential/Platelet -      CMP14+EGFR  Mixed hyperlipidemia -     CBC with Differential/Platelet -     CMP14+EGFR -     Lipid panel  ASCVD (arteriosclerotic cardiovascular disease) -     CBC with Differential/Platelet -     CMP14+EGFR -     furosemide (LASIX) 20 MG tablet; Take 1 tablet (20 mg total) by mouth daily as needed.       I have changed Frederick Davis. Frederick Davis's furosemide. I am also having him maintain his aspirin, acetaminophen, multivitamin, nitroGLYCERIN, Probiotic Product (VISBIOME PO), lipase/protease/amylase, carvedilol, and levothyroxine.  Allergies as of 04/04/2020      Reactions   Lipitor [atorvastatin] Other (See Comments)   Muscle aches      Medication List       Accurate as of April 04, 2020  9:27 PM. If you have any questions, ask your nurse or doctor.        acetaminophen 500 MG tablet Commonly known as: TYLENOL Take 500 mg by  mouth at bedtime.   aspirin 81 MG tablet Take 1 tablet (81 mg total) by mouth daily. What changed: when to take this   carvedilol 3.125 MG tablet Commonly known as: COREG TAKE (1) TABLET TWICE A DAY WITH MEALS (BREAKFAST AND SUPPER)   Creon 36000 UNITS Cpep capsule Generic drug: lipase/protease/amylase Take 1 capsule by mouth at the first bite of each meal, and 1 capsule during meal, and 1 capsule at the last bite of a meal.  Allow for 3 meals per day.  If eating snacks take 1 capsule with snack.   furosemide 20 MG tablet Commonly known as: LASIX Take 1 tablet (20 mg total) by mouth daily as needed.   levothyroxine 75 MCG tablet Commonly known as: SYNTHROID Take 1 tablet (75 mcg total) by mouth daily.   multivitamin capsule Take 1 capsule daily by mouth.   nitroGLYCERIN 0.4 MG SL tablet Commonly known as: NITROSTAT PLACE 1 TABLET UNDER THE TONGUE AT ONSET OF CHEST PAIN EVERY 5 MINTUES UP TO 3 TIMES AS NEEDED FOR CHEST PAIN   VISBIOME PO Take 2 capsules by mouth 2 (two) times daily.        Follow-up: Return in about 6 months (around  10/04/2020), or if symptoms worsen or fail to improve.  Claretta Fraise, [name]

## 2020-04-05 LAB — CBC WITH DIFFERENTIAL/PLATELET
Basophils Absolute: 0.1 10*3/uL (ref 0.0–0.2)
Basos: 1 %
EOS (ABSOLUTE): 0.3 10*3/uL (ref 0.0–0.4)
Eos: 4 %
Hematocrit: 34.6 % — ABNORMAL LOW (ref 37.5–51.0)
Hemoglobin: 11.6 g/dL — ABNORMAL LOW (ref 13.0–17.7)
Immature Grans (Abs): 0 10*3/uL (ref 0.0–0.1)
Immature Granulocytes: 0 %
Lymphocytes Absolute: 1.5 10*3/uL (ref 0.7–3.1)
Lymphs: 18 %
MCH: 28 pg (ref 26.6–33.0)
MCHC: 33.5 g/dL (ref 31.5–35.7)
MCV: 83 fL (ref 79–97)
Monocytes Absolute: 0.6 10*3/uL (ref 0.1–0.9)
Monocytes: 8 %
Neutrophils Absolute: 5.9 10*3/uL (ref 1.4–7.0)
Neutrophils: 69 %
Platelets: 260 10*3/uL (ref 150–450)
RBC: 4.15 x10E6/uL (ref 4.14–5.80)
RDW: 13.4 % (ref 11.6–15.4)
WBC: 8.5 10*3/uL (ref 3.4–10.8)

## 2020-04-05 LAB — CMP14+EGFR
ALT: 11 IU/L (ref 0–44)
AST: 19 IU/L (ref 0–40)
Albumin/Globulin Ratio: 1.7 (ref 1.2–2.2)
Albumin: 4 g/dL (ref 3.5–4.6)
Alkaline Phosphatase: 83 IU/L (ref 48–121)
BUN/Creatinine Ratio: 19 (ref 10–24)
BUN: 26 mg/dL (ref 10–36)
Bilirubin Total: 0.4 mg/dL (ref 0.0–1.2)
CO2: 23 mmol/L (ref 20–29)
Calcium: 9.8 mg/dL (ref 8.6–10.2)
Chloride: 99 mmol/L (ref 96–106)
Creatinine, Ser: 1.35 mg/dL — ABNORMAL HIGH (ref 0.76–1.27)
GFR calc Af Amer: 51 mL/min/{1.73_m2} — ABNORMAL LOW (ref >59)
GFR calc non Af Amer: 44 mL/min/{1.73_m2} — ABNORMAL LOW (ref >59)
Globulin, Total: 2.4 g/dL (ref 1.5–4.5)
Glucose: 84 mg/dL (ref 65–99)
Potassium: 4.6 mmol/L (ref 3.5–5.2)
Sodium: 135 mmol/L (ref 134–144)
Total Protein: 6.4 g/dL (ref 6.0–8.5)

## 2020-04-05 LAB — LIPID PANEL
Chol/HDL Ratio: 2.7 ratio (ref 0.0–5.0)
Cholesterol, Total: 190 mg/dL (ref 100–199)
HDL: 71 mg/dL (ref >39)
LDL Chol Calc (NIH): 103 mg/dL — ABNORMAL HIGH (ref 0–99)
Triglycerides: 90 mg/dL (ref 0–149)
VLDL Cholesterol Cal: 16 mg/dL (ref 5–40)

## 2020-04-05 LAB — TSH+FREE T4
Free T4: 1.32 ng/dL (ref 0.82–1.77)
TSH: 2.81 u[IU]/mL (ref 0.450–4.500)

## 2020-04-07 NOTE — Progress Notes (Signed)
Hello [name],  Your lab result is normal and/or stable.Some minor variations that are not significant are commonly marked abnormal, but do not represent any medical problem for you.  Best regards, Justyn Langham, [name]

## 2020-04-12 ENCOUNTER — Ambulatory Visit (INDEPENDENT_AMBULATORY_CARE_PROVIDER_SITE_OTHER): Payer: Medicare Other | Admitting: *Deleted

## 2020-04-12 DIAGNOSIS — Z0000 Encounter for general adult medical examination without abnormal findings: Secondary | ICD-10-CM

## 2020-04-12 NOTE — Patient Instructions (Signed)

## 2020-04-12 NOTE — Progress Notes (Signed)
MEDICARE ANNUAL WELLNESS VISIT  04/12/2020  Telephone Visit Disclaimer This Medicare AWV was conducted by telephone due to national recommendations for restrictions regarding the COVID-19 Pandemic (e.g. social distancing).  I verified, using two identifiers, that I am speaking with Frederick Davis or their authorized healthcare agent. I discussed the limitations, risks, security, and privacy concerns of performing an evaluation and management service by telephone and the potential availability of an in-person appointment in the future. The patient expressed understanding and agreed to proceed.   Subjective:  Frederick Davis is a 86 y.o. male patient of Stacks, Cletus Gash, MD who had a Medicare Annual Wellness Visit today via telephone. 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 he has 2 children. he reports that he is socially active and does interact with friends/family regularly. he is minimally physically active and enjoys reading, doing crossword puzzles and staying active in [place].  Patient Care Team: Claretta Fraise, MD as PCP - General (Family Medicine) Minus Breeding, MD as PCP - Cardiology (Cardiology) Burman Foster, MD as Physician Assistant (Urology) Danella Sensing, MD as Consulting Physician (Dermatology) Gaynelle Arabian, MD as Consulting Physician (Orthopedic Surgery) Rogene Houston, MD as Consulting Physician (Gastroenterology) Ilean China, RN as Registered Nurse  Advanced Directives 04/12/2020 04/12/2019 06/07/2018 02/23/2018 02/20/2018 12/15/2017 12/15/2017  Does Patient Have a Medical Advance Directive? Yes No No Yes Yes Yes -  Type of Paramedic of [place];Living will - - [place];Living will [place];Living will [place];Living will -  Does patient want to make changes to medical advance directive? No - Patient declined - - No - Patient  declined No - Patient declined No - Patient declined -  Copy of [place] in Chart? No - copy requested - - No - copy requested No - copy requested No - copy requested No - copy requested  Would patient like information on creating a medical advance directive? - Yes (MAU/Ambulatory/Procedural Areas - Information given) No - Patient declined - - - -  Pre-existing out of facility DNR order (yellow form or pink MOST form) - - - - - - -    Hospital Utilization Over the Past 12 Months: # of hospitalizations or ER visits: 0 # of surgeries: 0  Review of Systems    Patient reports that his overall health is unchanged compared to last year.  History obtained from chart review  Patient Reported Readings (BP, Pulse, CBG, Weight, etc) none  Pain Assessment Pain : No/denies pain     Current Medications & Allergies (verified) Allergies as of 04/12/2020      Reactions   Lipitor [atorvastatin] Other (See Comments)   Muscle aches      Medication List       Accurate as of April 12, 2020 10:11 AM. If you have any questions, ask your nurse or doctor.        acetaminophen 500 MG tablet Commonly known as: TYLENOL Take 500 mg by mouth at bedtime.   aspirin 81 MG tablet Take 1 tablet (81 mg total) by mouth daily. What changed: when to take this   carvedilol 3.125 MG tablet Commonly known as: COREG TAKE (1) TABLET TWICE A DAY WITH MEALS (BREAKFAST AND SUPPER)   Creon 36000 UNITS Cpep capsule Generic drug: lipase/protease/amylase Take 1 capsule by mouth at the first bite of each meal, and 1 capsule during meal,  and 1 capsule at the last bite of a meal.  Allow for 3 meals per day.  If eating snacks take 1 capsule with snack.   furosemide 20 MG tablet Commonly known as: LASIX Take 1 tablet (20 mg total) by mouth daily as needed.   levothyroxine 75 MCG tablet Commonly known as: SYNTHROID Take 1 tablet (75 mcg total) by mouth daily.   multivitamin capsule Take 1  capsule daily by mouth.   nitroGLYCERIN 0.4 MG SL tablet Commonly known as: NITROSTAT PLACE 1 TABLET UNDER THE TONGUE AT ONSET OF CHEST PAIN EVERY 5 MINTUES UP TO 3 TIMES AS NEEDED FOR CHEST PAIN   VISBIOME PO Take 2 capsules by mouth 2 (two) times daily.       History (reviewed): Past Medical History:  Diagnosis Date  . Anemia   . Aortic stenosis    a. mild-mod by TEE 11/2015.  . Arthritis    left knee   . CAD (coronary artery disease)    a. RCA thrombectomy 2014. b. CABGx3 in 11/2015 (EF 45-50% in 11/2015).  . Chronic renal insufficiency, stage III (moderate)    only has 0ne kidney  . Dyspnea    WITH EXERTION  . Esophageal stricture YRS AGO  . Hypertension   . Hypothyroidism   . Kidney stone 1969  . MI (myocardial infarction) ([place]) 2017  . Mitral regurgitation    a. mild by TEE 11/2015.  Marland Kitchen Myocardial infarction ([place]) 03/12/13   Inferior MI.  LAD mid 80% stenosis, circ mild plaque, RCA 100% with thrombectomy PTCA.  EF 65%  . Osteoporosis   . PC (prostate cancer) ([place]) 1987  . RBBB   . Rectum cancer ([place]) 1986  . Renal cell cancer ([place]) 2005   Past Surgical History:  Procedure Laterality Date  . CARDIAC CATHETERIZATION N/A 12/06/2015   Procedure: Left Heart Cath and Coronary Angiography;  Surgeon: Burnell Blanks, MD;  Location: [facility];  Service: Cardiovascular;  Laterality: N/A;  . COLONOSCOPY  01/08/2012   Procedure: COLONOSCOPY;  Surgeon: Rogene Houston, MD;  [facility];  Service: Endoscopy;  Laterality: N/A;  1030  . COLONOSCOPY N/A 07/20/2015   Procedure: COLONOSCOPY;  Surgeon: Rogene Houston, MD;  [facility];  Service: Endoscopy;  Laterality: N/A;  930 - moved to 9/29 @ 2:00  . COLOSTOMY Left 1986   colon cancer  . CORONARY ARTERY BYPASS GRAFT N/A 12/08/2015   Procedure: CORONARY ARTERY BYPASS GRAFTING (CABG);  Surgeon: Melrose Nakayama, MD;  Location: [facility];  Service: Open Heart Surgery;  Laterality: N/A;  Times 3  using left internal mammary artery and endoscopycally harvested bilateral saphenous vein.  Marland Kitchen HERNIA REPAIR     2007 RIGHT INGUINAL HERNIA  . KIDNEY SURGERY Right 2004  . LEFT HEART CATHETERIZATION WITH CORONARY ANGIOGRAM N/A 03/12/2013   Procedure: STEMI;  Surgeon: Burnell Blanks, MD;  [facility];  Service: Cardiovascular;  Laterality: N/A;  . PERCUTANEOUS CORONARY INTERVENTION-BALLOON ONLY    . PROSTATECTOMY    . [place]  2006  . REMOVAL OF PENILE PROSTHESIS N/A 12/15/2017   Procedure: REMOVAL OF PENILE PROSTHESIS;  Surgeon: Franchot Gallo, MD;  [facility];  Service: Urology;  Laterality: N/A;  . REMOVAL OF PENILE PROSTHESIS N/A 02/23/2018   Procedure: EXPLANT OF ERODED SPHINCTER;  Surgeon: Franchot Gallo, MD;  [facility];  Service: Urology;  Laterality: N/A;  . renal sphincter appliance  2007  . TEE  WITHOUT CARDIOVERSION N/A 12/08/2015   Procedure: TRANSESOPHAGEAL ECHOCARDIOGRAM (TEE);  Surgeon: Melrose Nakayama, MD;  Location: [facility];  Service: Open Heart Surgery;  Laterality: N/A;   Family History  Problem Relation Age of Onset  . Stroke Mother 31  . AAA (abdominal aortic aneurysm) Father 87       died from rupture at 84 yo  . Arthritis Father   . Parkinson's disease Sister   . Cancer Son        prostate cancer  . Hypertension Son    Social History   Socioeconomic History  . Marital status: Married    Spouse name: Mikle Bosworth  . Number of children: 2  . Years of education: 8  . Highest education level: Professional school degree (e.g., MD, DDS, DVM, JD)  Occupational History  . Occupation: pharmacist-retired  . Occupation: preacher-retired  Tobacco Use  . Smoking status: Former Smoker    Packs/day: 0.50    Years: 10.00    Pack years: 5.00    Types: Cigarettes    Start date: 10/21/1946    Quit date: 10/21/1968    Years since quitting: 51.5  . Smokeless tobacco: Never Used  Vaping Use  . Vaping Use: Never used    Substance and Sexual Activity  . Alcohol use: Yes    Alcohol/week: 1.0 standard drink    Types: 1 Standard drinks or equivalent per week    Comment: RARE  . Drug use: No  . Sexual activity: Not Currently  Other Topics Concern  . Not on file  Social History Narrative  . Not on file   Social Determinants of Health   Financial Resource Strain: Low Risk   . Difficulty of Paying Living Expenses: Not hard at all  Food Insecurity: No Food Insecurity  . Worried About Charity fundraiser in the Last Year: Never true  . Ran Out of Food in the Last Year: Never true  Transportation Needs: No Transportation Needs  . Lack of Transportation (Medical): No  . Lack of Transportation (Non-Medical): No  Physical Activity: Insufficiently Active  . Days of Exercise per Week: 4 days  . Minutes of Exercise per Session: 30 min  Stress: No Stress Concern Present  . Feeling of Stress : Not at all  Social Connections: Socially Integrated  . Frequency of Communication with Friends and Family: More than three times a week  . Frequency of Social Gatherings with Friends and Family: More than three times a week  . Attends Religious Services: More than 4 times per year  . Active Member of Clubs or Organizations: Yes  . Attends Archivist Meetings: More than 4 times per year  . Marital Status: Married    Activities of Daily Living In your present state of health, do you have any difficulty performing the following activities: 04/12/2020  Hearing? N  Vision? N  Comment wears rx glasses-only for reading  Difficulty concentrating or making decisions? Y  Comment more trouble remembering names  Walking or climbing stairs? N  Comment when using stairs go very slow and hold onto rails-uses his cane to walk  Dressing or bathing? N  Doing errands, shopping? N  Preparing Food and eating ? N  Using the Toilet? N  In the past six months, have you accidently leaked urine? Y  Comment has Cunningham clamp  so he does leak  Do you have problems with loss of bowel control? N  Managing your Medications? N  Managing your Finances? N  Housekeeping or managing your Housekeeping? Y  Comment him and his wife do everything but has someone come in to do the dusting and to vacuum  Some recent data might be hidden    Patient Education/ Literacy How often do you need to have someone help you when you read instructions, pamphlets, or other written materials from your doctor or pharmacy?: 1 - Never What is the last grade level you completed in school?: Bachelors Degree-Pharmacy then went back for a Bachelors Degree in Ministry  Exercise Current Exercise Habits: Home exercise routine, Type of exercise: walking, Time (Minutes): 30, Frequency (Times/Week): 4, Weekly Exercise (Minutes/Week): 120, Intensity: Mild, Exercise limited by: orthopedic condition(s);cardiac condition(s)  Diet Patient reports consuming 1 meals a day and 2 snack(s) a day Patient reports that his primary diet is: Regular Patient reports that she does have regular access to food.   Depression Screen PHQ 2/9 Scores 04/12/2020 04/04/2020 12/30/2019 09/30/2019 09/13/2019 08/10/2019 05/27/2019  PHQ - 2 Score 0 0 0 0 0 0 0  PHQ- 9 Score - - - 0 0 - 0     Fall Risk Fall Risk  04/12/2020 04/04/2020 12/30/2019 08/10/2019 05/27/2019  Falls in the past year? 0 0 0 0 0  Number falls in past yr: - 0 0 - -  Injury with Fall? - 0 0 - -  Risk for fall due to : - Impaired balance/gait;Impaired mobility No Fall Risks - -  Follow up - Falls evaluation completed Falls evaluation completed - -     Objective:  Frederick Davis seemed alert and oriented and he participated appropriately during our telephone visit.  Blood Pressure Weight BMI  BP Readings from Last 3 Encounters:  04/04/20 (!) 155/89  12/30/19 114/64  11/24/19 98/60   Wt Readings from Last 3 Encounters:  04/04/20 151 lb 6.4 oz (68.7 kg)  12/30/19 151 lb 12.8 oz (68.9 kg)  11/24/19 149  lb (67.6 kg)   BMI Readings from Last 1 Encounters:  04/04/20 26.82 kg/m    *Unable to obtain current vital signs, weight, and BMI due to telephone visit type  Hearing/Vision  . Frederick Davis did not seem to have difficulty with hearing/understanding during the telephone conversation . Reports that he has had a formal eye exam by an eye care professional within the past year . Reports that he has not had a formal hearing evaluation within the past year *Unable to fully assess hearing and vision during telephone visit type  Cognitive Function: 6CIT Screen 04/12/2020 04/12/2019  What Year? 0 points 0 points  What month? 0 points 0 points  What time? 0 points 0 points  Count back from 20 0 points 0 points  Months in reverse 0 points 0 points  Repeat phrase 2 points 0 points  Total Score 2 0   (Normal:0-7, Significant for Dysfunction: >8)  Normal Cognitive Function Screening: Yes   Immunization & Health Maintenance Record Immunization History  Administered Date(s) Administered  . Fluad Quad(high Dose 65+) 08/02/2019  . Influenza Whole 08/13/2012  . Influenza, High Dose Seasonal PF 08/06/2017, 08/11/2018, 08/02/2019  . Influenza,inj,Quad PF,6+ Mos 08/12/2013, 08/09/2014, 09/27/2015, 07/30/2016  . Influenza-Unspecified 08/12/2013, 08/09/2014, 09/27/2015, 07/30/2016  . Pneumococcal Conjugate-13 12/28/2013  . Pneumococcal Polysaccharide-23 10/21/1990  . Zoster 07/08/2011    Health Maintenance  Topic Date Due  . COVID-19 Vaccine (1) 04/20/2020 (Originally 10/21/1934)  . TETANUS/TDAP  04/04/2021 (Originally 08/21/2018)  . INFLUENZA VACCINE  05/21/2020  . PNA vac Low Risk Adult  Completed  Assessment  This is a routine wellness examination for Frederick Davis.  Health Maintenance: Due or Overdue There are no preventive care reminders to display for this patient.  Frederick Davis does not need a referral for Community Assistance: Care Management:   no Social  Work:    no Prescription Assistance:  no Nutrition/Diabetes Education:  no   Plan:  Personalized Goals Goals Addressed            This Visit's Progress   . DIET - INCREASE WATER INTAKE       Try to drink 6-8 glasses of water daily      Personalized Health Maintenance & Screening Recommendations  Td vaccine Advanced directives: has an advanced directive - a copy HAS NOT been provided. COVID Vaccine  Lung Cancer Screening Recommended: no (Low Dose CT Chest recommended if Age 68-80 years, 30 pack-year currently smoking OR have quit w/in past 15 years) Hepatitis C Screening recommended: no HIV Screening recommended: no  Advanced Directives: Written information was not prepared per patient's request.  Referrals & Orders No orders of the defined types were placed in this encounter.   Follow-up Plan . Follow-up with Claretta Fraise, MD as planned . Declined COVID vaccine, if you change your mind you can get at your local pharmacy . Consider TDAP vaccine at your next visit with your PCP . Bring a copy of your Advanced Directives in for our records   I have personally reviewed and noted the following in the patient's chart:   . Medical and social history . Use of alcohol, tobacco or illicit drugs  . Current medications and supplements . Functional ability and status . Nutritional status . Physical activity . Advanced directives . List of other physicians . Hospitalizations, surgeries, and ER visits in previous 12 months . Vitals . Screenings to include cognitive, depression, and falls . Referrals and appointments  In addition, I have reviewed and discussed with Frederick Davis certain preventive protocols, quality metrics, and best practice recommendations. A written personalized care plan for preventive services as well as general preventive health recommendations is available and can be mailed to the patient at his request.      Milas Hock,  LPN  [id number]

## 2020-06-06 NOTE — Progress Notes (Signed)
Cardiology Office Note   Date:  06/07/2020   ID:  Frederick Davis, DOB Jun 17, 1923, MRN [id number]  PCP:  Claretta Fraise, MD  Cardiologist:   Minus Breeding, MD   Chief Complaint  Patient presents with  . Coronary Artery Disease      History of Present Illness: Frederick Davis is a 86 y.o. male who for followup after a acute inferior wall infarct in May of 2014. He had thrombectomy of a right coronary artery occlusion. He was initially acute respiratory failure and was cooled. However, he responded very well to therapy.    He underwent a CABG 3 on February 2017.     Since I last saw him he has had no new cardiovascular complaints. The patient denies any new symptoms such as chest discomfort, neck or arm discomfort. There has been no new shortness of breath, PND or orthopnea. There have been no reported palpitations, presyncope or syncope.  He gets around slowly with his cane.  Past Medical History:  Diagnosis Date  . Anemia   . Aortic stenosis    a. mild-mod by TEE 11/2015.  . Arthritis    left knee   . CAD (coronary artery disease)    a. RCA thrombectomy 2014. b. CABGx3 in 11/2015 (EF 45-50% in 11/2015).  . Chronic renal insufficiency, stage III (moderate)    only has 0ne kidney  . Dyspnea    WITH EXERTION  . Esophageal stricture YRS AGO  . Hypertension   . Hypothyroidism   . Kidney stone 1969  . MI (myocardial infarction) ([place]) 2017  . Mitral regurgitation    a. mild by TEE 11/2015.  Marland Kitchen Myocardial infarction ([place]) 03/12/13   Inferior MI.  LAD mid 80% stenosis, circ mild plaque, RCA 100% with thrombectomy PTCA.  EF 65%  . Osteoporosis   . PC (prostate cancer) ([place]) 1987  . RBBB   . Rectum cancer ([place]) 1986  . Renal cell cancer ([place]) 2005    Past Surgical History:  Procedure Laterality Date  . CARDIAC CATHETERIZATION N/A 12/06/2015   Procedure: Left Heart Cath and Coronary Angiography;  Surgeon: Burnell Blanks, MD;  Location: [facility];  Service:  Cardiovascular;  Laterality: N/A;  . COLONOSCOPY  01/08/2012   Procedure: COLONOSCOPY;  Surgeon: Rogene Houston, MD;  [facility];  Service: Endoscopy;  Laterality: N/A;  1030  . COLONOSCOPY N/A 07/20/2015   Procedure: COLONOSCOPY;  Surgeon: Rogene Houston, MD;  [facility];  Service: Endoscopy;  Laterality: N/A;  930 - moved to 9/29 @ 2:00  . COLOSTOMY Left 1986   colon cancer  . CORONARY ARTERY BYPASS GRAFT N/A 12/08/2015   Procedure: CORONARY ARTERY BYPASS GRAFTING (CABG);  Surgeon: Melrose Nakayama, MD;  Location: [facility];  Service: Open Heart Surgery;  Laterality: N/A;  Times 3 using left internal mammary artery and endoscopycally harvested bilateral saphenous vein.  Marland Kitchen HERNIA REPAIR     2007 RIGHT INGUINAL HERNIA  . KIDNEY SURGERY Right 2004  . LEFT HEART CATHETERIZATION WITH CORONARY ANGIOGRAM N/A 03/12/2013   Procedure: STEMI;  Surgeon: Burnell Blanks, MD;  [facility];  Service: Cardiovascular;  Laterality: N/A;  . PERCUTANEOUS CORONARY INTERVENTION-BALLOON ONLY    . PROSTATECTOMY    . [place]  2006  . REMOVAL OF PENILE PROSTHESIS N/A 12/15/2017   Procedure: REMOVAL OF PENILE PROSTHESIS;  Surgeon: Franchot Gallo, MD;  [facility];  Service: Urology;  Laterality:  N/A;  . REMOVAL OF PENILE PROSTHESIS N/A 02/23/2018   Procedure: EXPLANT OF ERODED SPHINCTER;  Surgeon: Franchot Gallo, MD;  [facility];  Service: Urology;  Laterality: N/A;  . renal sphincter appliance  2007  . TEE WITHOUT CARDIOVERSION N/A 12/08/2015   Procedure: TRANSESOPHAGEAL ECHOCARDIOGRAM (TEE);  Surgeon: Melrose Nakayama, MD;  Location: [place];  Service: Open Heart Surgery;  Laterality: N/A;   Prior to Admission medications   Medication Sig Start Date End Date Taking? Authorizing Provider  acetaminophen (TYLENOL) 500 MG tablet Take 500 mg by mouth at bedtime.   Yes [provider]  amLODipine (NORVASC) 2.5 MG tablet TAKE 1  TABLET DAILY 10/25/19  Yes Minus Breeding, MD  aspirin 81 MG tablet Take 1 tablet (81 mg total) by mouth daily. Patient taking differently: Take 81 mg by mouth at bedtime.  03/18/13  Yes Barrett, Evelene Croon, PA-C  carvedilol (COREG) 3.125 MG tablet TAKE (1) TABLET TWICE A DAY WITH MEALS (BREAKFAST AND SUPPER) 10/25/19  Yes Stacks, Cletus Gash, MD  furosemide (LASIX) 20 MG tablet Take 1 tablet daily as needed. 05/24/19  Yes Minus Breeding, MD  guaiFENesin (MUCINEX PO) Take by mouth daily as needed.    Yes [provider]  imiquimod (ALDARA) 5 % cream Apply topically at bedtime.  05/10/19  Yes [provider]  levothyroxine (SYNTHROID) 75 MCG tablet Take 1 tablet (75 mcg total) by mouth daily. 07/23/19  Yes Claretta Fraise, MD  lipase/protease/amylase (CREON) 36000 UNITS CPEP capsule Take 1 capsule by mouth at the first bite of each meal, and 1 capsule during meal, and 1 capsule at the last bite of a meal.  Allow for 3 meals per day.  If eating snacks take 1 capsule with snack. 11/02/19  Yes [provider]  Multiple Vitamin (MULTIVITAMIN) capsule Take 1 capsule daily by mouth.   Yes [provider]  nitroGLYCERIN (NITROSTAT) 0.4 MG SL tablet PLACE 1 TABLET UNDER THE TONGUE AT ONSET OF CHEST PAIN EVERY 5 MINTUES UP TO 3 TIMES AS NEEDED FOR CHEST PAIN 01/04/19  Yes Almyra Deforest, PA  Probiotic Product (VISBIOME PO) Take 2 capsules by mouth 2 (two) times daily.   Yes [provider]  diphenoxylate-atropine (LOMOTIL) 2.5-0.025 MG tablet Take 2 tablets by mouth 4 (four) times daily as needed for diarrhea or loose stools. Patient not taking: Reported on 11/24/2019 06/29/19   Claretta Fraise, MD      Allergies:   Lipitor [atorvastatin]    ROS:  Please see the history of present illness.   Otherwise, review of systems are positive for none.   All other systems are reviewed and negative.    PHYSICAL EXAM: VS:  BP 120/78   Pulse 67   Ht 5\' 3"  (1.6 m)   Wt 148 lb (67.1 kg)   BMI  26.22 kg/m  , BMI Body mass index is 26.22 kg/m. GENERAL:  Well appearing for his age NECK:  No jugular venous distention, waveform within normal limits, carotid upstroke brisk and symmetric, no bruits, no thyromegaly LUNGS:  Clear to auscultation bilaterally CHEST:  Well healed sternotomy scar. HEART:  PMI not displaced or sustained,S1 and S2 within normal limits, no S3, no S4, no clicks, no rubs, 3 of 6 apical systolic murmur radiating up the aortic outflow tract, no diastolic murmurs ABD:  Flat, positive bowel sounds normal in frequency in pitch, no bruits, no rebound, no guarding, no midline pulsatile mass, no hepatomegaly, no splenomegaly EXT:  2 plus pulses throughout,  moderate ankle edema, no cyanosis no clubbing   EKG:  EKG is ordered today. Sinus rhythm, first-degree AV block, right bundle branch block, left axis deviation, left anterior fascicular block, no change from previous.  Recent Labs: 04/04/2020: ALT 11; BUN 26; Creatinine, Ser 1.35; Hemoglobin 11.6; Platelets 260; Potassium 4.6; Sodium 135; TSH 2.810    Lipid Panel    Component Value Date/Time   CHOL 190 04/04/2020 1144   CHOL 106 05/27/2013 0818   TRIG 90 04/04/2020 1144   TRIG 81 10/29/2016 0815   TRIG 76 05/27/2013 0818   HDL 71 04/04/2020 1144   HDL 54 10/29/2016 0815   HDL 62 05/27/2013 0818   CHOLHDL 2.7 04/04/2020 1144   CHOLHDL 2.8 10/18/2015 0530   VLDL 19 10/18/2015 0530   LDLCALC 103 (H) 04/04/2020 1144   LDLCALC 29 08/09/2014 0854   LDLCALC 29 05/27/2013 0818      Wt Readings from Last 3 Encounters:  06/07/20 148 lb (67.1 kg)  04/04/20 151 lb 6.4 oz (68.7 kg)  12/30/19 151 lb 12.8 oz (68.9 kg)      Other studies Reviewed: Additional studies/ records that were reviewed today include: Labs. Review of the above records demonstrates:  Please see elsewhere in the note.     ASSESSMENT AND PLAN:  CAD/CABG:    The patient has no new sypmtoms.  No further cardiovascular testing is indicated.   We will continue with aggressive risk reduction and meds as listed.  HTN:   The blood pressure is at target.  No change in therapy.   DYSLIPIDEMIA:    LDL is 103 with an HDL of 71.  This is a reasonable ratio.  No change in therapy.   AORTIC STENOSIS: This has been moderate.  I will follow this clinically.  COVID EDUCATION:     We had a long conversation about the vaccine and his hesitancy.  They will consider this.   Current medicines are reviewed at length with the patient today.  The patient does not have concerns regarding medicines.  The following changes have been made:  no change  Labs/ tests ordered today include: None  Orders Placed This Encounter  Procedures  . EKG 12-Lead     Disposition:   FU with me in six months.     Signed, Minus Breeding, MD  06/07/2020 2:58 PM    [place]

## 2020-06-07 ENCOUNTER — Ambulatory Visit (INDEPENDENT_AMBULATORY_CARE_PROVIDER_SITE_OTHER): Payer: Medicare Other | Admitting: Cardiology

## 2020-06-07 ENCOUNTER — Other Ambulatory Visit: Payer: Self-pay

## 2020-06-07 ENCOUNTER — Encounter: Payer: Self-pay | Admitting: Cardiology

## 2020-06-07 VITALS — BP 120/78 | HR 67 | Ht 63.0 in | Wt 148.0 lb

## 2020-06-07 DIAGNOSIS — I350 Nonrheumatic aortic (valve) stenosis: Secondary | ICD-10-CM

## 2020-06-07 DIAGNOSIS — E785 Hyperlipidemia, unspecified: Secondary | ICD-10-CM | POA: Diagnosis not present

## 2020-06-07 DIAGNOSIS — I2510 Atherosclerotic heart disease of native coronary artery without angina pectoris: Secondary | ICD-10-CM

## 2020-06-07 DIAGNOSIS — I10 Essential (primary) hypertension: Secondary | ICD-10-CM

## 2020-06-07 DIAGNOSIS — Z7189 Other specified counseling: Secondary | ICD-10-CM

## 2020-06-07 NOTE — Patient Instructions (Signed)
Medication Instructions:  The current medical regimen is effective;  continue present plan and medications.  *If you need a refill on your cardiac medications before your next appointment, please call your pharmacy*  Follow-Up: At [facility], you and your health needs are our priority.  As part of our continuing mission to provide you with exceptional heart care, we have created designated Provider Care Teams.  These Care Teams include your primary Cardiologist (physician) and Advanced Practice Providers (APPs -  Physician Assistants and Nurse Practitioners) who all work together to provide you with the care you need, when you need it.  We recommend signing up for the patient portal called "MyChart".  Sign up information is provided on this After Visit Summary.  MyChart is used to connect with patients for Virtual Visits (Telemedicine).  Patients are able to view lab/test results, encounter notes, upcoming appointments, etc.  Non-urgent messages can be sent to your provider as well.   To learn more about what you can do with MyChart, go to [email or web address].    Your next appointment:   6 month(s)  The format for your next appointment:   In Person  Provider:   James Hochrein, MD   Thank you for choosing [facility]!!     

## 2020-06-22 ENCOUNTER — Ambulatory Visit: Payer: Medicare Other | Admitting: *Deleted

## 2020-06-22 DIAGNOSIS — I2510 Atherosclerotic heart disease of native coronary artery without angina pectoris: Secondary | ICD-10-CM

## 2020-06-22 DIAGNOSIS — I10 Essential (primary) hypertension: Secondary | ICD-10-CM

## 2020-06-22 NOTE — Chronic Care Management (AMB) (Signed)
  Chronic Care Management   Initial Visit Note  06/22/2020 Name: Frederick Davis MRN: [id number] DOB: [birth year]  Referred by: Frederick Fraise, MD Reason for referral : Chronic Care Management (Initial Visit)   Frederick Davis is a 86 y.o. year old male who is a primary care patient of Stacks, Cletus Gash, MD. The CCM team was consulted for assistance with chronic disease management and care coordination needs related to CAD, HTN, CKD Stage 3 and hypothyroidism  Review of patient status, including review of consultants reports, relevant laboratory and other test results, and collaboration with appropriate care team members and the patient's provider was performed as part of comprehensive patient evaluation and provision of chronic care management services.    SDOH (Social Determinants of Health) assessments performed: Yes See Care Plan activities for detailed interventions related to SDOH    I reached out to Frederick Davis by telephone today and spoke with him and his wife via speaker phone. He and his wife live alone and are very active. They does not feel that they have any CCM or resource needs at this time and feel that their medical conditions are well managed. Although they do not feel like participation in the CCM program is needed at this time, they did appreciate the telephone call and Frederick Davis took down my number to reach out if any needs develop in the future.  Plan:   CCM enrollment status changed to "previously enrolled" as per patient request on 06/22/20 to discontinue enrollment. Case closed to case management services in primary care home.   Chong Sicilian, BSN, RN-BC Embedded Chronic Care Manager [facility] Direct Dial: [phone]

## 2020-06-22 NOTE — Patient Instructions (Signed)
Lovenia Debruler, BSN, RN-BC Embedded Chronic Care Manager [facility] Direct Dial: [phone]    

## 2020-07-05 ENCOUNTER — Ambulatory Visit (INDEPENDENT_AMBULATORY_CARE_PROVIDER_SITE_OTHER): Payer: Medicare Other | Admitting: Family Medicine

## 2020-07-05 ENCOUNTER — Encounter: Payer: Self-pay | Admitting: Family Medicine

## 2020-07-05 VITALS — BP 135/79 | HR 61 | Temp 97.7°F | Ht 63.0 in

## 2020-07-05 DIAGNOSIS — R040 Epistaxis: Secondary | ICD-10-CM

## 2020-07-05 LAB — HEMOGLOBIN, FINGERSTICK: Hemoglobin: 10.8 g/dL — ABNORMAL LOW (ref 12.6–17.7)
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
## 2020-07-05 NOTE — Patient Instructions (Signed)
Saline or Ocean nasal spray throughout the day. Humidifier.    Nosebleed, Adult A nosebleed is when blood comes out of the nose. Nosebleeds are common. Usually, they are not a sign of a serious condition. Nosebleeds can happen if a small blood vessel in your nose starts to bleed or if the lining of your nose (mucous membrane) cracks. They are commonly caused by:  Allergies.  Colds.  Picking your nose.  Blowing your nose too hard.  An injury from sticking an object into your nose or getting hit in the nose.  Dry or cold air. Less common causes of nosebleeds include:  Toxic fumes.  Something abnormal in the nose or in the air-filled spaces in the bones of the face (sinuses).  Growths in the nose, such as polyps.  Medicines or conditions that cause blood to clot slowly.  Certain illnesses or procedures that irritate or dry out the nasal passages. Follow these instructions at home: When you have a nosebleed:   Sit down and tilt your head slightly forward.  Use a clean towel or tissue to pinch your nostrils under the bony part of your nose. After 10 minutes, let go of your nose and see if bleeding starts again. Do not release pressure before that time. If there is still bleeding, repeat the pinching and holding for 10 minutes until the bleeding stops.  Do not place tissues or gauze in the nose to stop bleeding.  Avoid lying down and avoid tilting your head backward. That may make blood collect in the throat and cause gagging or coughing.  Use a nasal spray decongestant to help with a nosebleed as told by your health care provider.  Do not use petroleum jelly or mineral oil in your nose. It can drip into your lungs. After a nosebleed:  Avoid blowing your nose or sniffing for a number of hours.  Avoid straining, lifting, or bending at the waist for several days. You may resume other normal activities as you are able.  Use saline spray or a humidifier as told by your health  care provider.  Aspirinand blood thinners make bleeding more likely. If you are prescribed these medicines and you suffer from nosebleeds: ? Ask your health care provider if you should stop taking the medicines or if you should adjust the dose. ? Do not stop taking medicines that your health care provider has recommended unless told by your health care provider.  If your nosebleed was caused by dry mucous membranes, use over-the-counter saline nasal spray or gel. This will keep the mucous membranes moist and allow them to heal. If you must use a lubricant: ? Choose one that is water-soluble. ? Use only as much as you need and use it only as often as needed. ? Do not lie down until several hours after you use it. Contact a health care provider if:  You have a fever.  You get nosebleeds often or more often than usual.  You bruise very easily.  You have a nosebleed from having something stuck in your nose.  You have bleeding in your mouth.  You vomit or cough up brown material.  You have a nosebleed after you start a new medicine. Get help right away if:  You have a nosebleed after a fall or a head injury.  Your nosebleed does not go away after 20 minutes.  You feel dizzy or weak.  You have unusual bleeding from other parts of your body.  You have unusual bruising  on other parts of your body.  You become sweaty.  You vomit blood. This information is not intended to replace advice given to you by your health care provider. Make sure you discuss any questions you have with your health care provider. Document Revised: 01/06/2018 Document Reviewed: 04/23/2016 Elsevier Patient Education  [place].

## 2020-07-08 ENCOUNTER — Encounter: Payer: Self-pay | Admitting: Family Medicine

## 2020-07-12 ENCOUNTER — Telehealth: Payer: Self-pay | Admitting: Cardiology

## 2020-07-12 NOTE — Telephone Encounter (Signed)
Patient's wife is calling to speak with Dr. Rosezella [place] nurse. She states I did not need to take a message as it is just a quick thought and won't take too long. Please advise.

## 2020-07-12 NOTE — Telephone Encounter (Signed)
Return call to pt's wife. She report she was calling to speak with Nurse Jeannene Patella. Will forward message.

## 2020-07-12 NOTE — Telephone Encounter (Signed)
Called and spoke with Frederick Davis who wanted to make sure I am still working here because they "never want to be without me" they "adore" me and are praying for me.  Reassured her I am still working here and will continue to do so as long as God is willing and I am able.  They both are aware to call with any questions or concerns as needed.

## 2020-07-24 ENCOUNTER — Other Ambulatory Visit: Payer: Self-pay | Admitting: Family Medicine

## 2020-10-04 ENCOUNTER — Encounter: Payer: Self-pay | Admitting: Family Medicine

## 2020-10-04 ENCOUNTER — Ambulatory Visit (INDEPENDENT_AMBULATORY_CARE_PROVIDER_SITE_OTHER): Payer: Medicare Other | Admitting: Family Medicine

## 2020-10-04 ENCOUNTER — Other Ambulatory Visit: Payer: Self-pay

## 2020-10-04 VITALS — BP 130/88 | HR 62 | Temp 97.4°F | Ht 63.0 in | Wt 146.0 lb

## 2020-10-04 DIAGNOSIS — I10 Essential (primary) hypertension: Secondary | ICD-10-CM

## 2020-10-04 DIAGNOSIS — I2510 Atherosclerotic heart disease of native coronary artery without angina pectoris: Secondary | ICD-10-CM | POA: Diagnosis not present

## 2020-10-04 DIAGNOSIS — I340 Nonrheumatic mitral (valve) insufficiency: Secondary | ICD-10-CM

## 2020-10-04 DIAGNOSIS — K449 Diaphragmatic hernia without obstruction or gangrene: Secondary | ICD-10-CM

## 2020-10-04 DIAGNOSIS — E039 Hypothyroidism, unspecified: Secondary | ICD-10-CM

## 2020-10-04 DIAGNOSIS — R42 Dizziness and giddiness: Secondary | ICD-10-CM

## 2020-10-04 DIAGNOSIS — N1832 Chronic kidney disease, stage 3b: Secondary | ICD-10-CM

## 2020-10-04 MED ORDER — FUROSEMIDE 20 MG PO TABS: 20.0000 mg | ORAL_TABLET | Freq: Every day | ORAL | 6 refills | Status: DC | PRN

## 2020-10-04 MED ORDER — CARVEDILOL 3.125 MG PO TABS: ORAL_TABLET | ORAL | 1 refills | Status: DC

## 2020-10-04 MED ORDER — MECLIZINE HCL 25 MG PO TABS: 25.0000 mg | ORAL_TABLET | Freq: Three times a day (TID) | ORAL | 0 refills | Status: DC | PRN

## 2020-10-04 NOTE — Progress Notes (Signed)
Subjective:  Patient ID: Frederick Davis, male    DOB: 12/19/1922  Age: 86 y.o. MRN: [id number]  CC: Follow-up (6 month/)   HPI Frederick Davis [facility] presents for follow-up of hypertension. Patient has no history of headache chest pain or shortness of breath or recent cough. Patient also denies symptoms of TIA such as numbness weakness lateralizing. Patient checks  blood pressure at home and has not had any elevated readings recently. Patient denies side effects from his medication. States taking it regularly. He also is having some dizziness described as lightheadedness.  There is also vertigo.  He says that the room does move around on him.  He gets good relief with meclizine.  This is been a recurrent problem for many years and is stable but has had recent recurrence. Patient presents for follow-up on  thyroid. The patient has a history of hypothyroidism for many years. It has been stable recently. Pt. denies any change in  voice, loss of hair, heat or cold intolerance. Energy level has been adequate to good. Patient denies constipation and diarrhea. No myxedema. Medication is as noted below. Verified that pt is taking it daily on an empty stomach. Well tolerated.  Depression screen [facility] 2/9 10/04/2020 07/05/2020 04/12/2020  Decreased Interest 0 0 0  Down, Depressed, Hopeless 0 0 0  PHQ - 2 Score 0 0 0  Altered sleeping - - -  Tired, decreased energy - - -  Change in appetite - - -  Feeling bad or failure about yourself  - - -  Trouble concentrating - - -  Moving slowly or fidgety/restless - - -  Suicidal thoughts - - -  PHQ-9 Score - - -  Difficult doing work/chores - - -  Some recent data might be hidden    History Daniell has a past medical history of Anemia, Aortic stenosis, Arthritis, CAD (coronary artery disease), Chronic renal insufficiency, stage III (moderate) (HCC), Dyspnea, Esophageal stricture (YRS AGO), Hypertension, Hypothyroidism, Kidney stone (1969), MI (myocardial  infarction) ([place]) (2017), Mitral regurgitation, Myocardial infarction ([place]) (03/12/13), Osteoporosis, PC (prostate cancer) ([place]) (1987), RBBB, Rectum cancer ([place]) (1986), and Renal cell cancer ([place]) (2005).   He has a past surgical history that includes Kidney surgery (Right, 2004); Radiofrequency ablation kidney (2006); renal sphincter appliance (2007); Colonoscopy (01/08/2012); Colostomy (Left, 1986); Percutaneous coronary intervention-balloon only; left heart catheterization with coronary angiogram (N/A, 03/12/2013); Prostatectomy; Colonoscopy (N/A, 07/20/2015); Cardiac catheterization (N/A, 12/06/2015); Coronary artery bypass graft (N/A, 12/08/2015); TEE without cardioversion (N/A, 12/08/2015); Hernia repair; Removal of penile prosthesis (N/A, 12/15/2017); and Removal of penile prosthesis (N/A, 02/23/2018).   His family history includes AAA (abdominal aortic aneurysm) (age of onset: 60) in his father; Arthritis in his father; Cancer in his son; Hypertension in his son; Parkinson's disease in his sister; Stroke (age of onset: 68) in his mother.He reports that he quit smoking about 52 years ago. His smoking use included cigarettes. He started smoking about 74 years ago. He has a 5.00 pack-year smoking history. He has never used smokeless tobacco. He reports current alcohol use of about 1.0 standard drink of alcohol per week. He reports that he does not use drugs.    ROS Review of Systems  Constitutional: Negative.   HENT: Negative.   Eyes: Negative for visual disturbance.  Respiratory: Negative for cough and shortness of breath.   Cardiovascular: Negative for chest pain and leg swelling.  Gastrointestinal: Negative for abdominal pain, diarrhea, nausea and vomiting.  Genitourinary: Negative for difficulty urinating.  Musculoskeletal:  Negative for arthralgias and myalgias.  Skin: Negative for rash.  Neurological: Positive for dizziness, weakness and light-headedness. Negative for headaches.   Psychiatric/Behavioral: Negative for dysphoric mood and sleep disturbance. The patient is not nervous/anxious.     Objective:  BP 130/88    Pulse 62    Temp (!) 97.4 F (36.3 C) (Temporal)    Ht '5\' 3"'  (1.6 m)    Wt 146 lb (66.2 kg)    BMI 25.86 kg/m   BP Readings from Last 3 Encounters:  10/04/20 130/88  07/05/20 135/79  06/07/20 120/78    Wt Readings from Last 3 Encounters:  10/04/20 146 lb (66.2 kg)  06/07/20 148 lb (67.1 kg)  04/04/20 151 lb 6.4 oz (68.7 kg)     Physical Exam    Assessment & Plan:   Foch was seen today for follow-up.  Diagnoses and all orders for this visit:  Dizziness -     EKG 12-Lead -     Ear wax removal -     CBC with Differential/Platelet -     CMP14+EGFR -     TSH + free T4  ASCVD (arteriosclerotic cardiovascular disease) -     furosemide (LASIX) 20 MG tablet; Take 1 tablet (20 mg total) by mouth daily as needed. -     EKG 12-Lead -     CBC with Differential/Platelet -     CMP14+EGFR -     Lipid panel  Essential hypertension, benign -     CBC with Differential/Platelet -     CMP14+EGFR  Coronary artery disease involving native coronary artery of native heart without angina pectoris -     CBC with Differential/Platelet -     CMP14+EGFR -     Lipid panel  Nonrheumatic mitral valve regurgitation -     CBC with Differential/Platelet -     CMP14+EGFR  Hypothyroidism, unspecified type -     CBC with Differential/Platelet -     CMP14+EGFR -     TSH + free T4  Stage 3b chronic kidney disease (HCC)  Hiatal hernia  Other orders -     carvedilol (COREG) 3.125 MG tablet; TAKE (1) TABLET TWICE A DAY WITH MEALS (BREAKFAST AND SUPPER) -     meclizine (ANTIVERT) 25 MG tablet; Take 1 tablet (25 mg total) by mouth 3 (three) times daily as needed for dizziness.       I am having Markas Aldredge. Reaume start on meclizine. I am also having him maintain his aspirin, acetaminophen, multivitamin, nitroGLYCERIN, Probiotic Product (VISBIOME  PO), lipase/protease/amylase, levothyroxine, furosemide, and carvedilol.  Allergies as of 10/04/2020      Reactions   Lipitor [atorvastatin] Other (See Comments)   Muscle aches      Medication List       Accurate as of October 04, 2020 11:59 PM. If you have any questions, ask your nurse or doctor.        acetaminophen 500 MG tablet Commonly known as: TYLENOL Take 500 mg by mouth at bedtime.   aspirin 81 MG tablet Take 1 tablet (81 mg total) by mouth daily. What changed: when to take this   carvedilol 3.125 MG tablet Commonly known as: COREG TAKE (1) TABLET TWICE A DAY WITH MEALS (BREAKFAST AND SUPPER)   furosemide 20 MG tablet Commonly known as: LASIX Take 1 tablet (20 mg total) by mouth daily as needed.   levothyroxine 75 MCG tablet Commonly known as: SYNTHROID Take 1 tablet (75 mcg total) by mouth  daily.   lipase/protease/amylase 36000 UNITS Cpep capsule Commonly known as: CREON Take 1 capsule by mouth at the first bite of each meal, and 1 capsule during meal, and 1 capsule at the last bite of a meal.  Allow for 3 meals per day.  If eating snacks take 1 capsule with snack.   meclizine 25 MG tablet Commonly known as: ANTIVERT Take 1 tablet (25 mg total) by mouth 3 (three) times daily as needed for dizziness. Started by: Claretta Fraise, MD   multivitamin capsule Take 1 capsule daily by mouth.   nitroGLYCERIN 0.4 MG SL tablet Commonly known as: NITROSTAT PLACE 1 TABLET UNDER THE TONGUE AT ONSET OF CHEST PAIN EVERY 5 MINTUES UP TO 3 TIMES AS NEEDED FOR CHEST PAIN   VISBIOME PO Take 2 capsules by mouth 2 (two) times daily.        Follow-up: Return in about 6 months (around 04/04/2021), or if symptoms worsen or fail to improve.  Claretta Fraise, [name]

## 2020-10-05 LAB — CBC WITH DIFFERENTIAL/PLATELET
Basophils Absolute: 0.1 10*3/uL (ref 0.0–0.2)
Basos: 1 %
EOS (ABSOLUTE): 0.2 10*3/uL (ref 0.0–0.4)
Eos: 2 %
Hematocrit: 36.8 % — ABNORMAL LOW (ref 37.5–51.0)
Hemoglobin: 12.2 g/dL — ABNORMAL LOW (ref 13.0–17.7)
Immature Grans (Abs): 0 10*3/uL (ref 0.0–0.1)
Immature Granulocytes: 0 %
Lymphocytes Absolute: 1.5 10*3/uL (ref 0.7–3.1)
Lymphs: 15 %
MCH: 28.4 pg (ref 26.6–33.0)
MCHC: 33.2 g/dL (ref 31.5–35.7)
MCV: 86 fL (ref 79–97)
Monocytes Absolute: 0.7 10*3/uL (ref 0.1–0.9)
Monocytes: 7 %
Neutrophils Absolute: 7.6 10*3/uL — ABNORMAL HIGH (ref 1.4–7.0)
Neutrophils: 75 %
Platelets: 269 10*3/uL (ref 150–450)
RBC: 4.3 x10E6/uL (ref 4.14–5.80)
RDW: 13.2 % (ref 11.6–15.4)
WBC: 10.2 10*3/uL (ref 3.4–10.8)

## 2020-10-05 LAB — CMP14+EGFR
ALT: 20 IU/L (ref 0–44)
AST: 22 IU/L (ref 0–40)
Albumin/Globulin Ratio: 2 (ref 1.2–2.2)
Albumin: 4.2 g/dL (ref 3.5–4.6)
Alkaline Phosphatase: 86 IU/L (ref 44–121)
BUN/Creatinine Ratio: 19 (ref 10–24)
BUN: 30 mg/dL (ref 10–36)
Bilirubin Total: 0.4 mg/dL (ref 0.0–1.2)
CO2: 21 mmol/L (ref 20–29)
Calcium: 9.9 mg/dL (ref 8.6–10.2)
Chloride: 96 mmol/L (ref 96–106)
Creatinine, Ser: 1.54 mg/dL — ABNORMAL HIGH (ref 0.76–1.27)
GFR calc Af Amer: 44 mL/min/{1.73_m2} — ABNORMAL LOW (ref >59)
GFR calc non Af Amer: 38 mL/min/{1.73_m2} — ABNORMAL LOW (ref >59)
Globulin, Total: 2.1 g/dL (ref 1.5–4.5)
Glucose: 90 mg/dL (ref 65–99)
Potassium: 4.7 mmol/L (ref 3.5–5.2)
Sodium: 140 mmol/L (ref 134–144)
Total Protein: 6.3 g/dL (ref 6.0–8.5)

## 2020-10-05 LAB — LIPID PANEL
Chol/HDL Ratio: 2.6 ratio (ref 0.0–5.0)
Cholesterol, Total: 188 mg/dL (ref 100–199)
HDL: 72 mg/dL (ref >39)
LDL Chol Calc (NIH): 100 mg/dL — ABNORMAL HIGH (ref 0–99)
Triglycerides: 92 mg/dL (ref 0–149)
VLDL Cholesterol Cal: 16 mg/dL (ref 5–40)

## 2020-10-05 LAB — TSH+FREE T4
Free T4: 1.4 ng/dL (ref 0.82–1.77)
TSH: 3.65 u[IU]/mL (ref 0.450–4.500)

## 2020-10-09 ENCOUNTER — Telehealth: Payer: Self-pay

## 2020-10-09 NOTE — Progress Notes (Signed)
Pt is returning nurse call about lab results

## 2020-11-09 DIAGNOSIS — C44319 Basal cell carcinoma of skin of other parts of face: Secondary | ICD-10-CM | POA: Diagnosis not present

## 2020-11-09 DIAGNOSIS — Z85828 Personal history of other malignant neoplasm of skin: Secondary | ICD-10-CM | POA: Diagnosis not present

## 2020-11-09 DIAGNOSIS — D2261 Melanocytic nevi of right upper limb, including shoulder: Secondary | ICD-10-CM | POA: Diagnosis not present

## 2020-11-09 DIAGNOSIS — D225 Melanocytic nevi of trunk: Secondary | ICD-10-CM | POA: Diagnosis not present

## 2020-11-09 DIAGNOSIS — L570 Actinic keratosis: Secondary | ICD-10-CM | POA: Diagnosis not present

## 2020-11-09 DIAGNOSIS — D1801 Hemangioma of skin and subcutaneous tissue: Secondary | ICD-10-CM | POA: Diagnosis not present

## 2020-11-09 DIAGNOSIS — D485 Neoplasm of uncertain behavior of skin: Secondary | ICD-10-CM | POA: Diagnosis not present

## 2020-11-09 DIAGNOSIS — D2262 Melanocytic nevi of left upper limb, including shoulder: Secondary | ICD-10-CM | POA: Diagnosis not present

## 2020-12-05 NOTE — Progress Notes (Unsigned)
Cardiology Office Note   Date:  12/06/2020   ID:  Frederick Davis, DOB May 11, 1923, MRN [id number]  PCP:  Claretta Fraise, MD  Cardiologist:   Minus Breeding, MD   Chief Complaint  Patient presents with  . Dizziness      History of Present Illness: Frederick Davis is a 87 y.o. male who for followup after a acute inferior wall infarct in May of 2014. He had thrombectomy of a right coronary artery occlusion. He was initially acute respiratory failure and was cooled. However, he responded very well to therapy.    He underwent a CABG 3 on February 2017.     Since I last saw him he has done well.  Has had no new cardiovascular symptoms.  He gets around with his cane.  He does have some mild dizziness when he first gets up in the morning but he sits at the edge of his bed for a while and does not jump up quickly.  He has not had any palpitations, presyncope or syncope.  He said no chest pressure, neck or arm discomfort.  He has had no new shortness of breath, PND or orthopnea.   Past Medical History:  Diagnosis Date  . Anemia   . Aortic stenosis    a. mild-mod by TEE 11/2015.  . Arthritis    left knee   . CAD (coronary artery disease)    a. RCA thrombectomy 2014. b. CABGx3 in 11/2015 (EF 45-50% in 11/2015).  . Chronic renal insufficiency, stage III (moderate) (HCC)    only has 0ne kidney  . Dyspnea    WITH EXERTION  . Esophageal stricture YRS AGO  . Hypertension   . Hypothyroidism   . Kidney stone 1969  . MI (myocardial infarction) ([place]) 2017  . Mitral regurgitation    a. mild by TEE 11/2015.  Marland Kitchen Myocardial infarction ([place]) 03/12/13   Inferior MI.  LAD mid 80% stenosis, circ mild plaque, RCA 100% with thrombectomy PTCA.  EF 65%  . Osteoporosis   . PC (prostate cancer) ([place]) 1987  . RBBB   . Rectum cancer ([place]) 1986  . Renal cell cancer ([place]) 2005    Past Surgical History:  Procedure Laterality Date  . CARDIAC CATHETERIZATION N/A 12/06/2015   Procedure: Left Heart Cath  and Coronary Angiography;  Surgeon: Burnell Blanks, MD;  Location: [facility];  Service: Cardiovascular;  Laterality: N/A;  . COLONOSCOPY  01/08/2012   Procedure: COLONOSCOPY;  Surgeon: Rogene Houston, MD;  [facility];  Service: Endoscopy;  Laterality: N/A;  1030  . COLONOSCOPY N/A 07/20/2015   Procedure: COLONOSCOPY;  Surgeon: Rogene Houston, MD;  [facility];  Service: Endoscopy;  Laterality: N/A;  930 - moved to 9/29 @ 2:00  . COLOSTOMY Left 1986   colon cancer  . CORONARY ARTERY BYPASS GRAFT N/A 12/08/2015   Procedure: CORONARY ARTERY BYPASS GRAFTING (CABG);  Surgeon: Melrose Nakayama, MD;  Location: [facility];  Service: Open Heart Surgery;  Laterality: N/A;  Times 3 using left internal mammary artery and endoscopycally harvested bilateral saphenous vein.  Marland Kitchen HERNIA REPAIR     2007 RIGHT INGUINAL HERNIA  . KIDNEY SURGERY Right 2004  . LEFT HEART CATHETERIZATION WITH CORONARY ANGIOGRAM N/A 03/12/2013   Procedure: STEMI;  Surgeon: Burnell Blanks, MD;  Location: [name]ATH [facility];  Service: Cardiovascular;  Laterality: N/A;  . PERCUTANEOUS CORONARY INTERVENTION-BALLOON ONLY    . PROSTATECTOMY    .  [place]  2006  . REMOVAL OF PENILE PROSTHESIS N/A 12/15/2017   Procedure: REMOVAL OF PENILE PROSTHESIS;  Surgeon: Franchot Gallo, MD;  Location: [facility];  Service: Urology;  Laterality: N/A;  . REMOVAL OF PENILE PROSTHESIS N/A 02/23/2018   Procedure: EXPLANT OF ERODED SPHINCTER;  Surgeon: Franchot Gallo, MD;  Location: [facility];  Service: Urology;  Laterality: N/A;  . renal sphincter appliance  2007  . TEE WITHOUT CARDIOVERSION N/A 12/08/2015   Procedure: TRANSESOPHAGEAL ECHOCARDIOGRAM (TEE);  Surgeon: Melrose Nakayama, MD;  Location: [facility];  Service: Open Heart Surgery;  Laterality: N/A;   Prior to Admission medications   Medication Sig Start Date End Date Taking? Authorizing Provider  acetaminophen (TYLENOL) 500 MG tablet Take  500 mg by mouth at bedtime.   Yes [provider]  aspirin 81 MG tablet Take 1 tablet (81 mg total) by mouth daily. Patient taking differently: Take 81 mg by mouth at bedtime. 03/18/13  Yes Barrett, Evelene Croon, PA-C  carvedilol (COREG) 3.125 MG tablet TAKE (1) TABLET TWICE A DAY WITH MEALS (BREAKFAST AND SUPPER) 10/04/20  Yes Stacks, Cletus Gash, MD  furosemide (LASIX) 20 MG tablet Take 1 tablet (20 mg total) by mouth daily as needed. 10/04/20  Yes Claretta Fraise, MD  levothyroxine (SYNTHROID) 75 MCG tablet Take 1 tablet (75 mcg total) by mouth daily. 04/04/20  Yes Claretta Fraise, MD  lipase/protease/amylase (CREON) 36000 UNITS CPEP capsule Take 1 capsule by mouth at the first bite of each meal, and 1 capsule during meal, and 1 capsule at the last bite of a meal.  Allow for 3 meals per day.  If eating snacks take 1 capsule with snack. 11/02/19  Yes [provider]  meclizine (ANTIVERT) 25 MG tablet Take 1 tablet (25 mg total) by mouth 3 (three) times daily as needed for dizziness. 10/04/20  Yes StacksCletus Gash, MD  Multiple Vitamin (MULTIVITAMIN) capsule Take 1 capsule daily by mouth.   Yes [provider]  Multiple Vitamins-Minerals (ZINC PO) Take by mouth at bedtime.   Yes [provider]  nitroGLYCERIN (NITROSTAT) 0.4 MG SL tablet PLACE 1 TABLET UNDER THE TONGUE AT ONSET OF CHEST PAIN EVERY 5 MINTUES UP TO 3 TIMES AS NEEDED FOR CHEST PAIN 01/04/19  Yes Almyra Deforest, PA  Probiotic Product (VISBIOME PO) Take 2 capsules by mouth 2 (two) times daily.   Yes [provider]     Allergies:   Lipitor [atorvastatin]    ROS:  Please see the history of present illness.   Otherwise, review of systems are positive for none.   All other systems are reviewed and negative.    PHYSICAL EXAM: VS:  BP 120/80   Pulse 64   Ht 5\' 3"  (1.6 m)   Wt 148 lb (67.1 kg)   BMI 26.22 kg/m  , BMI Body mass index is 26.22 kg/m. GENERAL:  Well appearing NECK:  No jugular venous  distention, waveform within normal limits, carotid upstroke brisk and symmetric, no bruits, no thyromegaly LUNGS:  Clear to auscultation bilaterally CHEST:  Unremarkable HEART:  PMI not displaced or sustained,S1 and S2 within normal limits, no S3, no S4, no clicks, no rubs, 3 of 6 apical systolic murmur mid to late peaking, no diastolic murmurs ABD:  Flat, positive bowel sounds normal in frequency in pitch, no bruits, no rebound, no guarding, no midline pulsatile mass, no hepatomegaly, no splenomegaly EXT:  2 plus pulses throughout, no edema, no cyanosis no clubbing   EKG:  EKG  is not ordered today. Sinus rhythm, first-degree AV block, right bundle branch block, left axis deviation, left anterior fascicular block, no change from previous.  Recent Labs: 10/04/2020: ALT 20; BUN 30; Creatinine, Ser 1.54; Hemoglobin 12.2; Platelets 269; Potassium 4.7; Sodium 140; TSH 3.650    Lipid Panel    Component Value Date/Time   CHOL 188 10/04/2020 1121   CHOL 106 05/27/2013 0818   TRIG 92 10/04/2020 1121   TRIG 81 10/29/2016 0815   TRIG 76 05/27/2013 0818   HDL 72 10/04/2020 1121   HDL 54 10/29/2016 0815   HDL 62 05/27/2013 0818   CHOLHDL 2.6 10/04/2020 1121   CHOLHDL 2.8 10/18/2015 0530   VLDL 19 10/18/2015 0530   LDLCALC 100 (H) 10/04/2020 1121   LDLCALC 29 08/09/2014 0854   LDLCALC 29 05/27/2013 0818      Wt Readings from Last 3 Encounters:  12/06/20 148 lb (67.1 kg)  10/04/20 146 lb (66.2 kg)  06/07/20 148 lb (67.1 kg)      Other studies Reviewed: Additional studies/ records that were reviewed today include: Labs. Review of the above records demonstrates:  NA   ASSESSMENT AND PLAN:  CAD/CABG:     The patient has no new sypmtoms.  No further cardiovascular testing is indicated.  We will continue with aggressive risk reduction and meds as listed.  HTN:   The blood pressure is at target.  No change in therapy.   DYSLIPIDEMIA:    LDL is 100, HDL 72.  The ratio is reasonable.   No change in therapy.   AORTIC STENOSIS: This has been moderate.  It may be more based on his exam but given his advanced age I will manage this conservatively without further imaging.   CKD IIIB: Creatinine is 1.54 in December.  No change in therapy.  DIZZINESS: He has some mild dizziness when he stands up at times.  I have gotten rid of amlodipine in the past.  If he has more of this I would get rid of the carvedilol.  We had a long talk about this.  In particular he threatened to get up on a ladder recently and I told him that this was not a reasonable thing to think about him to be very careful with his gait and falling and his wife appreciated that conversation.   Current medicines are reviewed at length with the patient today.  The patient does not have concerns regarding medicines.  The following changes have been made: None  Labs/ tests ordered today include: None  No orders of the defined types were placed in this encounter.    Disposition:   FU with me 6 months.    Signed, Minus Breeding, MD  12/06/2020 2:46 PM    [facility]

## 2020-12-06 ENCOUNTER — Encounter: Payer: Self-pay | Admitting: Cardiology

## 2020-12-06 ENCOUNTER — Other Ambulatory Visit: Payer: Self-pay

## 2020-12-06 ENCOUNTER — Ambulatory Visit (INDEPENDENT_AMBULATORY_CARE_PROVIDER_SITE_OTHER): Payer: Medicare Other | Admitting: Cardiology

## 2020-12-06 VITALS — BP 120/80 | HR 64 | Ht 63.0 in | Wt 148.0 lb

## 2020-12-06 DIAGNOSIS — R42 Dizziness and giddiness: Secondary | ICD-10-CM | POA: Diagnosis not present

## 2020-12-06 NOTE — Patient Instructions (Signed)
Medication Instructions:  The current medical regimen is effective;  continue present plan and medications.  *If you need a refill on your cardiac medications before your next appointment, please call your pharmacy*  Follow-Up: At [facility], you and your health needs are our priority.  As part of our continuing mission to provide you with exceptional heart care, we have created designated Provider Care Teams.  These Care Teams include your primary Cardiologist (physician) and Advanced Practice Providers (APPs -  Physician Assistants and Nurse Practitioners) who all work together to provide you with the care you need, when you need it.  We recommend signing up for the patient portal called "MyChart".  Sign up information is provided on this After Visit Summary.  MyChart is used to connect with patients for Virtual Visits (Telemedicine).  Patients are able to view lab/test results, encounter notes, upcoming appointments, etc.  Non-urgent messages can be sent to your provider as well.   To learn more about what you can do with MyChart, go to [email or web address].    Your next appointment:   6 month(s)  The format for your next appointment:   In Person  Provider:   James Hochrein, MD   Thank you for choosing [facility]!!     

## 2020-12-19 DIAGNOSIS — N3946 Mixed incontinence: Secondary | ICD-10-CM | POA: Diagnosis not present

## 2020-12-19 DIAGNOSIS — N4889 Other specified disorders of penis: Secondary | ICD-10-CM | POA: Diagnosis not present

## 2021-01-10 ENCOUNTER — Other Ambulatory Visit: Payer: Self-pay | Admitting: Family Medicine

## 2021-01-10 DIAGNOSIS — E039 Hypothyroidism, unspecified: Secondary | ICD-10-CM

## 2021-01-11 DIAGNOSIS — K8681 Exocrine pancreatic insufficiency: Secondary | ICD-10-CM | POA: Diagnosis not present

## 2021-01-11 DIAGNOSIS — K5900 Constipation, unspecified: Secondary | ICD-10-CM | POA: Diagnosis not present

## 2021-01-11 DIAGNOSIS — K435 Parastomal hernia without obstruction or  gangrene: Secondary | ICD-10-CM | POA: Diagnosis not present

## 2021-01-11 DIAGNOSIS — R1012 Left upper quadrant pain: Secondary | ICD-10-CM | POA: Diagnosis not present

## 2021-01-11 DIAGNOSIS — K862 Cyst of pancreas: Secondary | ICD-10-CM | POA: Diagnosis not present

## 2021-01-31 DIAGNOSIS — Z961 Presence of intraocular lens: Secondary | ICD-10-CM | POA: Diagnosis not present

## 2021-01-31 DIAGNOSIS — H353131 Nonexudative age-related macular degeneration, bilateral, early dry stage: Secondary | ICD-10-CM | POA: Diagnosis not present

## 2021-04-04 ENCOUNTER — Ambulatory Visit (INDEPENDENT_AMBULATORY_CARE_PROVIDER_SITE_OTHER): Payer: Medicare Other | Admitting: Family Medicine

## 2021-04-04 ENCOUNTER — Encounter: Payer: Self-pay | Admitting: Family Medicine

## 2021-04-04 ENCOUNTER — Other Ambulatory Visit: Payer: Self-pay

## 2021-04-04 VITALS — BP 131/74 | HR 62 | Temp 97.1°F | Ht 63.0 in | Wt 149.4 lb

## 2021-04-04 DIAGNOSIS — I10 Essential (primary) hypertension: Secondary | ICD-10-CM

## 2021-04-04 DIAGNOSIS — E039 Hypothyroidism, unspecified: Secondary | ICD-10-CM | POA: Diagnosis not present

## 2021-04-04 DIAGNOSIS — E782 Mixed hyperlipidemia: Secondary | ICD-10-CM | POA: Diagnosis not present

## 2021-04-04 NOTE — Progress Notes (Signed)
Subjective:  Patient ID: Frederick Davis, male    DOB: 1922/12/03  Age: 87 y.o. MRN: [id number]  CC: Medical Management of Chronic Issues   HPI [name] presents for  presents for  follow-up of hypertension. Patient has no history of headache chest pain or shortness of breath or recent cough. Patient also denies symptoms of TIA such as focal numbness or weakness. Patient denies side effects from medication. States taking it regularly.   follow-up on  thyroid. The patient has a history of hypothyroidism for many years. It has been stable recently. Pt. denies any change in  voice, loss of hair, heat or cold intolerance. Energy level has been adequate to good. Patient denies constipation and diarrhea. No myxedema. Medication is as noted below. Verified that pt is taking it daily on an empty stomach. Well tolerated.   Depression screen [facility] 2/9 04/04/2021 10/04/2020 07/05/2020  Decreased Interest 0 0 0  Down, Depressed, Hopeless 0 0 0  PHQ - 2 Score 0 0 0  Altered sleeping - - -  Tired, decreased energy - - -  Change in appetite - - -  Feeling bad or failure about yourself  - - -  Trouble concentrating - - -  Moving slowly or fidgety/restless - - -  Suicidal thoughts - - -  PHQ-9 Score - - -  Some recent data might be hidden    History Clavin has a past medical history of Anemia, Aortic stenosis, Arthritis, CAD (coronary artery disease), Chronic renal insufficiency, stage III (moderate) (HCC), Dyspnea, Esophageal stricture (YRS AGO), Hypertension, Hypothyroidism, Kidney stone (1969), MI (myocardial infarction) ([place]) (2017), Mitral regurgitation, Myocardial infarction ([place]) (03/12/13), Osteoporosis, PC (prostate cancer) ([place]) (1987), RBBB, Rectum cancer ([place]) (1986), and Renal cell cancer ([place]) (2005).   He has a past surgical history that includes Kidney surgery (Right, 2004); Radiofrequency ablation kidney (2006); renal sphincter appliance (2007); Colonoscopy (01/08/2012); Colostomy  (Left, 1986); Percutaneous coronary intervention-balloon only; left heart catheterization with coronary angiogram (N/A, 03/12/2013); Prostatectomy; Colonoscopy (N/A, 07/20/2015); Cardiac catheterization (N/A, 12/06/2015); Coronary artery bypass graft (N/A, 12/08/2015); TEE without cardioversion (N/A, 12/08/2015); Hernia repair; Removal of penile prosthesis (N/A, 12/15/2017); and Removal of penile prosthesis (N/A, 02/23/2018).   His family history includes AAA (abdominal aortic aneurysm) (age of onset: 95) in his father; Arthritis in his father; Cancer in his son; Hypertension in his son; Parkinson's disease in his sister; Stroke (age of onset: 33) in his mother.He reports that he quit smoking about 52 years ago. His smoking use included cigarettes. He started smoking about 74 years ago. He has a 5.00 pack-year smoking history. He has never used smokeless tobacco. He reports current alcohol use of about 1.0 standard drink of alcohol per week. He reports that he does not use drugs.    ROS Review of Systems  Constitutional:  Negative for fever.  Respiratory:  Negative for shortness of breath.   Cardiovascular:  Negative for chest pain.  Musculoskeletal:  Negative for arthralgias.  Skin:  Negative for rash.   Objective:  BP 131/74   Pulse 62   Temp (!) 97.1 F (36.2 C)   Ht 5' 3" (1.6 m)   Wt 149 lb 6.4 oz (67.8 kg)   SpO2 98%   BMI 26.47 kg/m   BP Readings from Last 3 Encounters:  04/04/21 131/74  12/06/20 120/80  10/04/20 130/88    Wt Readings from Last 3 Encounters:  04/04/21 149 lb 6.4 oz (67.8 kg)  12/06/20 148 lb (67.1 kg)  10/04/20 146 lb (66.2 kg)     Physical Exam Constitutional:      General: He is not in acute distress.    Appearance: He is well-developed.  HENT:     Head: Normocephalic and atraumatic.     Right Ear: External ear normal.     Left Ear: External ear normal.     Nose: Nose normal.  Eyes:     Conjunctiva/sclera: Conjunctivae normal.     Pupils: Pupils are  equal, round, and reactive to light.  Cardiovascular:     Rate and Rhythm: Normal rate and regular rhythm.     Heart sounds: Normal heart sounds. No murmur heard. Pulmonary:     Effort: Pulmonary effort is normal. No respiratory distress.     Breath sounds: Normal breath sounds. No wheezing or rales.  Abdominal:     Palpations: Abdomen is soft.     Tenderness: There is no abdominal tenderness.  Musculoskeletal:        General: Normal range of motion.     Cervical back: Normal range of motion and neck supple.  Skin:    General: Skin is warm and dry.  Neurological:     Mental Status: He is alert and oriented to person, place, and time.     Deep Tendon Reflexes: Reflexes are normal and symmetric.  Psychiatric:        Behavior: Behavior normal.        Thought Content: Thought content normal.        Judgment: Judgment normal.      Assessment & Plan:   [name] was seen today for medical management of chronic issues.  Diagnoses and all orders for this visit:  Hypothyroidism, unspecified type -     TSH + free T4  Mixed hyperlipidemia -     Lipid panel  Essential hypertension, benign -     CBC with Differential/Platelet -     CMP14+EGFR      I am having [name]. [facility] maintain his aspirin, acetaminophen, multivitamin, nitroGLYCERIN, Probiotic Product (VISBIOME PO), lipase/protease/amylase, furosemide, carvedilol, meclizine, Multiple Vitamins-Minerals (ZINC PO), and levothyroxine.  Allergies as of 04/04/2021       Reactions   Lipitor [atorvastatin] Other (See Comments)   Muscle aches        Medication List        Accurate as of April 04, 2021 11:59 PM. If you have any questions, ask your nurse or doctor.          acetaminophen 500 MG tablet Commonly known as: TYLENOL Take 500 mg by mouth at bedtime.   aspirin 81 MG tablet Take 1 tablet (81 mg total) by mouth daily. What changed: when to take this   carvedilol 3.125 MG tablet Commonly known as:  COREG TAKE (1) TABLET TWICE A DAY WITH MEALS (BREAKFAST AND SUPPER)   furosemide 20 MG tablet Commonly known as: LASIX Take 1 tablet (20 mg total) by mouth daily as needed.   levothyroxine 75 MCG tablet Commonly known as: SYNTHROID Take 1 tablet (75 mcg total) by mouth daily.   lipase/protease/amylase 36000 UNITS Cpep capsule Commonly known as: CREON Take 1 capsule by mouth at the first bite of each meal, and 1 capsule during meal, and 1 capsule at the last bite of a meal.  Allow for 3 meals per day.  If eating snacks take 1 capsule with snack.   meclizine 25 MG tablet Commonly known as: ANTIVERT Take 1 tablet (25 mg total) by mouth 3 (  three) times daily as needed for dizziness.   multivitamin capsule Take 1 capsule daily by mouth.   nitroGLYCERIN 0.4 MG SL tablet Commonly known as: NITROSTAT PLACE 1 TABLET UNDER THE TONGUE AT ONSET OF CHEST PAIN EVERY 5 MINTUES UP TO 3 TIMES AS NEEDED FOR CHEST PAIN   VISBIOME PO Take 2 capsules by mouth 2 (two) times daily.   ZINC PO Take by mouth at bedtime.         Follow-up: No follow-ups on file.  Claretta Fraise, [name]

## 2021-04-05 LAB — CBC WITH DIFFERENTIAL/PLATELET
Basophils Absolute: 0.1 10*3/uL (ref 0.0–0.2)
Basos: 1 %
EOS (ABSOLUTE): 0.2 10*3/uL (ref 0.0–0.4)
Eos: 2 %
Hematocrit: 34.1 % — ABNORMAL LOW (ref 37.5–51.0)
Hemoglobin: 11.3 g/dL — ABNORMAL LOW (ref 13.0–17.7)
Immature Grans (Abs): 0 10*3/uL (ref 0.0–0.1)
Immature Granulocytes: 0 %
Lymphocytes Absolute: 1.4 10*3/uL (ref 0.7–3.1)
Lymphs: 18 %
MCH: 27.7 pg (ref 26.6–33.0)
MCHC: 33.1 g/dL (ref 31.5–35.7)
MCV: 84 fL (ref 79–97)
Monocytes Absolute: 0.7 10*3/uL (ref 0.1–0.9)
Monocytes: 8 %
Neutrophils Absolute: 5.8 10*3/uL (ref 1.4–7.0)
Neutrophils: 71 %
Platelets: 234 10*3/uL (ref 150–450)
RBC: 4.08 x10E6/uL — ABNORMAL LOW (ref 4.14–5.80)
RDW: 12.7 % (ref 11.6–15.4)
WBC: 8.1 10*3/uL (ref 3.4–10.8)

## 2021-04-05 LAB — CMP14+EGFR
ALT: 16 IU/L (ref 0–44)
AST: 20 IU/L (ref 0–40)
Albumin/Globulin Ratio: 1.9 (ref 1.2–2.2)
Albumin: 4.1 g/dL (ref 3.5–4.6)
Alkaline Phosphatase: 81 IU/L (ref 44–121)
BUN/Creatinine Ratio: 19 (ref 10–24)
BUN: 30 mg/dL (ref 10–36)
Bilirubin Total: 0.4 mg/dL (ref 0.0–1.2)
CO2: 22 mmol/L (ref 20–29)
Calcium: 9.8 mg/dL (ref 8.6–10.2)
Chloride: 98 mmol/L (ref 96–106)
Creatinine, Ser: 1.58 mg/dL — ABNORMAL HIGH (ref 0.76–1.27)
Globulin, Total: 2.2 g/dL (ref 1.5–4.5)
Glucose: 91 mg/dL (ref 65–99)
Potassium: 4.4 mmol/L (ref 3.5–5.2)
Sodium: 135 mmol/L (ref 134–144)
Total Protein: 6.3 g/dL (ref 6.0–8.5)
eGFR: 40 mL/min/{1.73_m2} — ABNORMAL LOW (ref >59)

## 2021-04-05 LAB — LIPID PANEL
Chol/HDL Ratio: 2.1 ratio (ref 0.0–5.0)
Cholesterol, Total: 147 mg/dL (ref 100–199)
HDL: 70 mg/dL (ref >39)
LDL Chol Calc (NIH): 61 mg/dL (ref 0–99)
Triglycerides: 86 mg/dL (ref 0–149)
VLDL Cholesterol Cal: 16 mg/dL (ref 5–40)

## 2021-04-05 LAB — TSH+FREE T4
Free T4: 1.42 ng/dL (ref 0.82–1.77)
TSH: 2.65 u[IU]/mL (ref 0.450–4.500)

## 2021-04-07 NOTE — Progress Notes (Signed)
Hello Yeiren,  Your lab result is normal and/or stable.Some minor variations that are not significant are commonly marked abnormal, but do not represent any medical problem for you.  Best regards, Nikyah Lackman, [name]

## 2021-04-13 ENCOUNTER — Ambulatory Visit (INDEPENDENT_AMBULATORY_CARE_PROVIDER_SITE_OTHER): Payer: Medicare Other

## 2021-04-13 VITALS — Ht 63.0 in | Wt 140.0 lb

## 2021-04-13 DIAGNOSIS — Z0000 Encounter for general adult medical examination without abnormal findings: Secondary | ICD-10-CM

## 2021-04-13 NOTE — Progress Notes (Signed)
Subjective:   Frederick Davis is a 87 y.o. male who presents for Medicare Annual/Subsequent preventive examination.  Virtual Visit via Telephone Note  I connected with  Frederick Davis on 04/13/21 at  9:45 AM EDT by telephone and verified that I am speaking with the correct person using two identifiers.  Location: Patient: Home Provider: [facility] Persons participating in the virtual visit: patient/Nurse Health Advisor   I discussed the limitations, risks, security and privacy concerns of performing an evaluation and management service by telephone and the availability of in person appointments. The patient expressed understanding and agreed to proceed.  Interactive audio and video telecommunications were attempted between this nurse and patient, however failed, due to patient having technical difficulties OR patient did not have access to video capability.  We continued and completed visit with audio only.  Some vital signs may be absent or patient reported.   Ilianna Davis E Frederick Dutil, LPN   Review of Systems     Cardiac Risk Factors include: advanced age (>7men, >63 women);dyslipidemia;male gender;hypertension;sedentary lifestyle     Objective:    Today's Vitals   04/13/21 0932  Weight: 140 lb (63.5 kg)  Height: 5\' 3"  (1.6 m)   Body mass index is 24.8 kg/m.  Advanced Directives 04/13/2021 04/12/2020 04/12/2019 06/07/2018 02/23/2018 02/20/2018 12/15/2017  Does Patient Have a Medical Advance Directive? Yes Yes No No Yes Yes Yes  Type of Paramedic of [place];Living will [place];Living will - - [place];Living will [place];Living will [place];Living will  Does patient want to make changes to medical advance directive? - No - Patient declined - - No - Patient declined No - Patient declined No - Patient declined  Copy of [place] in Chart? No - copy requested No - copy  requested - - No - copy requested No - copy requested No - copy requested  Would patient like information on creating a medical advance directive? - - Yes (MAU/Ambulatory/Procedural Areas - Information given) No - Patient declined - - -  Pre-existing out of facility DNR order (yellow form or pink MOST form) - - - - - - -    Current Medications (verified) Outpatient Encounter Medications as of 04/13/2021  Medication Sig   acetaminophen (TYLENOL) 500 MG tablet Take 500 mg by mouth at bedtime.   aspirin 81 MG tablet Take 1 tablet (81 mg total) by mouth daily. (Patient taking differently: Take 81 mg by mouth at bedtime.)   carvedilol (COREG) 3.125 MG tablet TAKE (1) TABLET TWICE A DAY WITH MEALS (BREAKFAST AND SUPPER)   furosemide (LASIX) 20 MG tablet Take 1 tablet (20 mg total) by mouth daily as needed.   levothyroxine (SYNTHROID) 75 MCG tablet Take 1 tablet (75 mcg total) by mouth daily.   lipase/protease/amylase (CREON) 36000 UNITS CPEP capsule Take 1 capsule by mouth at the first bite of each meal, and 1 capsule during meal, and 1 capsule at the last bite of a meal.  Allow for 3 meals per day.  If eating snacks take 1 capsule with snack.   meclizine (ANTIVERT) 25 MG tablet Take 1 tablet (25 mg total) by mouth 3 (three) times daily as needed for dizziness.   Multiple Vitamin (MULTIVITAMIN) capsule Take 1 capsule daily by mouth.   Multiple Vitamins-Minerals (ZINC PO) Take by mouth at bedtime.   nitroGLYCERIN (NITROSTAT) 0.4 MG SL tablet PLACE 1 TABLET UNDER THE TONGUE AT ONSET OF CHEST  PAIN EVERY 5 MINTUES UP TO 3 TIMES AS NEEDED FOR CHEST PAIN   Probiotic Product (VISBIOME PO) Take 2 capsules by mouth 2 (two) times daily.   Facility-Administered Encounter Medications as of 04/13/2021  Medication   etomidate (AMIDATE) injection   lidocaine (cardiac) 100 mg/18ml (XYLOCAINE) 20 MG/ML injection 2%   succinylcholine (ANECTINE) injection    Allergies (verified) Lipitor [atorvastatin]    History: Past Medical History:  Diagnosis Date   Anemia    Aortic stenosis    a. mild-mod by TEE 11/2015.   Arthritis    left knee    CAD (coronary artery disease)    a. RCA thrombectomy 2014. b. CABGx3 in 11/2015 (EF 45-50% in 11/2015).   Chronic renal insufficiency, stage III (moderate) (HCC)    only has 0ne kidney   Dyspnea    WITH EXERTION   Esophageal stricture YRS AGO   Hypertension    Hypothyroidism    Kidney stone 1969   MI (myocardial infarction) ([place]) 2017   Mitral regurgitation    a. mild by TEE 11/2015.   Myocardial infarction ([place]) 03/12/13   Inferior MI.  LAD mid 80% stenosis, circ mild plaque, RCA 100% with thrombectomy PTCA.  EF 65%   Osteoporosis    PC (prostate cancer) ([place]) 1987   RBBB    Rectum cancer ([place]) 1986   Renal cell cancer ([place]) 2005   Past Surgical History:  Procedure Laterality Date   CARDIAC CATHETERIZATION N/A 12/06/2015   Procedure: Left Heart Cath and Coronary Angiography;  Surgeon: Burnell Blanks, MD;  Location: [facility];  Service: Cardiovascular;  Laterality: N/A;   COLONOSCOPY  01/08/2012   Procedure: COLONOSCOPY;  Surgeon: Rogene Houston, MD;  [facility];  Service: Endoscopy;  Laterality: N/A;  1030   COLONOSCOPY N/A 07/20/2015   Procedure: COLONOSCOPY;  Surgeon: Rogene Houston, MD;  [facility];  Service: Endoscopy;  Laterality: N/A;  930 - moved to 9/29 @ 2:00   COLOSTOMY Left 1986   colon cancer   CORONARY ARTERY BYPASS GRAFT N/A 12/08/2015   Procedure: CORONARY ARTERY BYPASS GRAFTING (CABG);  Surgeon: Melrose Nakayama, MD;  Location: [facility];  Service: Open Heart Surgery;  Laterality: N/A;  Times 3 using left internal mammary artery and endoscopycally harvested bilateral saphenous vein.   HERNIA REPAIR     2007 RIGHT INGUINAL HERNIA   KIDNEY SURGERY Right 2004   LEFT HEART CATHETERIZATION WITH CORONARY ANGIOGRAM N/A 03/12/2013   Procedure: STEMI;  Surgeon: Burnell Blanks, MD;   Location: [facility];  Service: Cardiovascular;  Laterality: N/A;   PERCUTANEOUS CORONARY INTERVENTION-[place] ONLY     PROSTATECTOMY     RADIOFREQUENCY ABLATION KIDNEY  2006   REMOVAL OF PENILE PROSTHESIS N/A 12/15/2017   Procedure: REMOVAL OF PENILE PROSTHESIS;  Surgeon: Franchot Gallo, MD;  [facility]: [facility];  Service: Urology;  Laterality: N/A;   REMOVAL OF PENILE PROSTHESIS N/A 02/23/2018   Procedure: [name] OF ERODED SPHINCTER;  Surgeon: Franchot Gallo, MD;  [facility]: [facility];  Service: Urology;  Laterality: N/A;   renal sphincter appliance  2007   TEE WITHOUT CARDIOVERSION N/A 12/08/2015   Procedure: TRANSESOPHAGEAL ECHOCARDIOGRAM (TEE);  Surgeon: Melrose Nakayama, MD;  Location: [facility];  Service: Open Heart Surgery;  Laterality: N/A;   Family History  Problem Relation Age of Onset   Stroke Mother 35   AAA (abdominal aortic aneurysm) Father 25       died from rupture at  77 yo   Arthritis Father    Parkinson's disease Sister    Cancer Son        prostate cancer   Hypertension Son    Social History   Socioeconomic History   Marital status: Married    Spouse name: Mikle Bosworth   Number of children: 4   Years of education: 65   Highest education level: Professional school degree (e.g., MD, DDS, DVM, JD)  Occupational History   Occupation: pharmacist-retired   Occupation: preacher-retired  Tobacco Use   Smoking status: Former    Packs/day: 0.50    Years: 10.00    Pack years: 5.00    Types: Cigarettes    Start date: 10/21/1946    Quit date: 10/21/1968    Years since quitting: 52.5   Smokeless tobacco: Never  Vaping Use   Vaping Use: Never used  Substance and Sexual Activity   Alcohol use: Yes    Alcohol/week: 1.0 standard drink    Types: 1 Standard drinks or equivalent per week    Comment: RARE   Drug use: No   Sexual activity: Not Currently  Other Topics Concern   Not on file  Social History Narrative   Lives home with wife. One child in [place], others  live in [place] Resource Strain: Low Risk    Difficulty of Paying Living Expenses: Not hard at all  Food Insecurity: Not on file  Transportation Needs: Not on file  Physical Activity: Sufficiently Active   Days of Exercise per Week: 3 days   Minutes of Exercise per Session: 50 min  Stress: No Stress Concern Present   Feeling of Stress : Only a little  Social Connections: Engineer, building services of Communication with Friends and Family: More than three times a week   Frequency of Social Gatherings with Friends and Family: More than three times a week   Attends Religious Services: More than 4 times per year   Active Member of [employer] or Organizations: Yes   Attends Music therapist: More than 4 times per year   Marital Status: Married    Tobacco Counseling Counseling given: Not Answered   Clinical Intake:  Pre-visit preparation completed: Yes  Pain : No/denies pain     BMI - recorded: 24.8 Nutritional Status: BMI of 19-24  Normal Nutritional Risks: None Diabetes: No  How often do you need to have someone help you when you read instructions, pamphlets, or other written materials from your doctor or pharmacy?: 1 - Never  Diabetic?No  Interpreter Needed?: No  Information entered by :: Azarie Coriz, LPN   Activities of Daily Living In your present state of health, do you have any difficulty performing the following activities: 04/13/2021  Hearing? N  Vision? N  Difficulty concentrating or making decisions? Y  Walking or climbing stairs? N  Dressing or bathing? N  Doing errands, shopping? N  Preparing Food and eating ? N  Using the Toilet? N  In the past six months, have you accidently leaked urine? N  Do you have problems with loss of bowel control? N  Managing your Medications? N  Managing your Finances? N  Housekeeping or managing your Housekeeping? N  Some recent data might be hidden     Patient Care Team: Claretta Fraise, MD as PCP - General (Family Medicine) Minus Breeding, MD as PCP - Cardiology (Cardiology) Burman Foster, MD as Physician Assistant (Urology) Danella Sensing, MD as  Consulting Physician (Dermatology) Gaynelle Arabian, MD as Consulting Physician (Orthopedic Surgery) Rogene Houston, MD as Consulting Physician (Gastroenterology)  Indicate any recent Medical Services you may have received from other than [facility] providers in the past year (date may be approximate).     Assessment:   This is a routine wellness examination for [place].  Hearing/Vision screen No results found.  Dietary issues and exercise activities discussed:     Goals Addressed             This Visit's Progress    Prevent Falls   On track    And CONTINUE healthy lifestyle         Depression Screen PHQ 2/9 Scores 04/13/2021 04/04/2021 10/04/2020 07/05/2020 04/12/2020 04/04/2020 12/30/2019  PHQ - 2 Score 0 0 0 0 0 0 0  PHQ- 9 Score - - - - - - -    Fall Risk Fall Risk  04/04/2021 10/04/2020 07/05/2020 04/12/2020 04/04/2020  Falls in the past year? 0 0 0 0 0  Number falls in past yr: - 0 - - 0  Injury with Fall? - 0 - - 0  Risk for fall due to : - Impaired balance/gait;Impaired mobility - - Impaired balance/gait;Impaired mobility  Follow up - Falls evaluation completed - - Falls evaluation completed    FALL RISK PREVENTION PERTAINING TO THE HOME:  Any stairs in or around the home? Yes  If so, are there any without handrails? No  Home free of loose throw rugs in walkways, pet beds, electrical cords, etc? Yes  Adequate lighting in your home to reduce risk of falls? Yes   ASSISTIVE DEVICES UTILIZED TO PREVENT FALLS:  Life alert? No  Use of a cane, walker or w/c? Yes  Grab bars in the bathroom? Yes  Shower chair or bench in shower? Yes  Elevated toilet seat or a handicapped toilet? Yes   TIMED UP AND GO:  Was the test performed? No . Telephonic visit  Cognitive  Function: MMSE - Mini Mental State Exam 08/18/2017  Orientation to time 5  Orientation to Place 5  Registration 3  Attention/ Calculation 5  Recall 2  Language- name 2 objects 2  Language- repeat 1  Language- follow 3 step command 3  Language- read & follow direction 1  Write a sentence 1  Copy design 1  Total score 29     6CIT Screen 04/13/2021 04/12/2020 04/12/2019  What Year? 0 points 0 points 0 points  What month? 0 points 0 points 0 points  What time? 0 points 0 points 0 points  Count back from 20 0 points 0 points 0 points  Months in reverse 0 points 0 points 0 points  Repeat phrase 10 points 2 points 0 points  Total Score 10 2 0    Immunizations Immunization History  Administered Date(s) Administered   Fluad Quad(high Dose 65+) 08/02/2019   Influenza Whole 08/13/2012   Influenza, High Dose Seasonal PF 08/06/2017, 08/11/2018, 08/02/2019   Influenza,inj,Quad PF,6+ Mos 08/12/2013, 08/09/2014, 09/27/2015, 07/30/2016   Influenza-Unspecified 08/12/2013, 08/09/2014, 09/27/2015, 07/30/2016   Pneumococcal Conjugate-13 12/28/2013   Pneumococcal Polysaccharide-23 10/21/1990   Zoster, Live 07/08/2011    TDAP status: Due, Education has been provided regarding the importance of this vaccine. Advised may receive this vaccine at local pharmacy or Health Dept. Aware to provide a copy of the vaccination record if obtained from local pharmacy or Health Dept. Verbalized acceptance and understanding.  Flu Vaccine status: Up to date  Pneumococcal vaccine status: Up  to date  Covid-19 vaccine status: Declined, Education has been provided regarding the importance of this vaccine but patient still declined. Advised may receive this vaccine at local pharmacy or Health Dept.or vaccine clinic. Aware to provide a copy of the vaccination record if obtained from local pharmacy or Health Dept. Verbalized acceptance and understanding.  Qualifies for Shingles Vaccine? Yes   Zostavax completed Yes    Shingrix Completed?: No.    Education has been provided regarding the importance of this vaccine. Patient has been advised to call insurance company to determine out of pocket expense if they have not yet received this vaccine. Advised may also receive vaccine at local pharmacy or Health Dept. Verbalized acceptance and understanding.  Screening Tests Health Maintenance  Topic Date Due   TETANUS/TDAP  08/21/2018   COVID-19 Vaccine (1) 04/20/2021 (Originally 10/21/1927)   Zoster Vaccines- Shingrix (1 of 2) 07/05/2021 (Originally 10/22/1941)   INFLUENZA VACCINE  05/21/2021   PNA vac Low Risk Adult  Completed   HPV VACCINES  Aged Out    Health Maintenance  Health Maintenance Due  Topic Date Due   TETANUS/TDAP  08/21/2018    Colorectal cancer screening: No longer required.   Lung Cancer Screening: (Low Dose CT Chest recommended if Age 23-80 years, 30 pack-year currently smoking OR have quit w/in 15years.) does not qualify.   Additional Screening:  Hepatitis C Screening: does not qualify  Vision Screening: Recommended annual ophthalmology exams for early detection of glaucoma and other disorders of the eye. Is the patient up to date with their annual eye exam?  Yes  Who is the provider or what is the name of the office in which the patient attends annual eye exams? Dr Marin Comment in [place] and/or [facility] If pt is not established with a provider, would they like to be referred to a provider to establish care? No .   Dental Screening: Recommended annual dental exams for proper oral hygiene  Community Resource Referral / Chronic Care Management: CRR required this visit?  No   CCM required this visit?  No      Plan:     I have personally reviewed and noted the following in the patient's chart:   Medical and social history Use of alcohol, tobacco or illicit drugs  Current medications and supplements including opioid prescriptions. Patient is not currently taking opioid  prescriptions. Functional ability and status Nutritional status Physical activity Advanced directives List of other physicians Hospitalizations, surgeries, and ER visits in previous 12 months Vitals Screenings to include cognitive, depression, and falls Referrals and appointments  In addition, I have reviewed and discussed with patient certain preventive protocols, quality metrics, and best practice recommendations. A written personalized care plan for preventive services as well as general preventive health recommendations were provided to patient.     Sandrea Hammond, LPN   03/21/5614   Nurse Notes: None

## 2021-04-13 NOTE — Patient Instructions (Signed)
Frederick Davis , Thank you for taking time to come for your Medicare Wellness Visit. I appreciate your ongoing commitment to your health goals. Please review the following plan we discussed and let me know if I can assist you in the future.   Screening recommendations/referrals: Colonoscopy: Done 07/20/2015 - no repeat required Recommended yearly ophthalmology/optometry visit for glaucoma screening and checkup Recommended yearly dental visit for hygiene and checkup  Vaccinations: Influenza vaccine: Done 2021 - repeat every fall Pneumococcal vaccine: Done 01/15/1991 & 12/28/2013 Tdap vaccine: Done 2009 - Past Due (every 10 years) Shingles vaccine: Zostavax done 07/08/2011 - due for shingrix.    Covid-19: Declined  Advanced directives: Please bring a copy of your health care power of attorney and living will to the office to be added to your chart at your convenience.  Conditions/risks identified: Aim for 30 minutes of exercise or brisk walking each day, drink 6-8 glasses of water and eat lots of fruits and vegetables.  Next appointment: Follow up in one year for your annual wellness visit.   Preventive Care 36 Years and Older, Male  Preventive care refers to lifestyle choices and visits with your health care provider that can promote health and wellness. What does preventive care include? A yearly physical exam. This is also called an annual well check. Dental exams once or twice a year. Routine eye exams. Ask your health care provider how often you should have your eyes checked. Personal lifestyle choices, including: Daily care of your teeth and gums. Regular physical activity. Eating a healthy diet. Avoiding tobacco and drug use. Limiting alcohol use. Practicing safe sex. Taking low doses of aspirin every day. Taking vitamin and mineral supplements as recommended by your health care provider. What happens during an annual well check? The services and screenings done by your health  care provider during your annual well check will depend on your age, overall health, lifestyle risk factors, and family history of disease. Counseling  Your health care provider may ask you questions about your: Alcohol use. Tobacco use. Drug use. Emotional well-being. Home and relationship well-being. Sexual activity. Eating habits. History of falls. Memory and ability to understand (cognition). Work and work Statistician. Screening  You may have the following tests or measurements: Height, weight, and BMI. Blood pressure. Lipid and cholesterol levels. These may be checked every 5 years, or more frequently if you are over 69 years old. Skin check. Lung cancer screening. You may have this screening every year starting at age 5 if you have a 30-pack-year history of smoking and currently smoke or have quit within the past 15 years. Fecal occult blood test (FOBT) of the stool. You may have this test every year starting at age 53. Flexible sigmoidoscopy or colonoscopy. You may have a sigmoidoscopy every 5 years or a colonoscopy every 10 years starting at age 71. Prostate cancer screening. Recommendations will vary depending on your family history and other risks. Hepatitis C blood test. Hepatitis B blood test. Sexually transmitted disease (STD) testing. Diabetes screening. This is done by checking your blood sugar (glucose) after you have not eaten for a while (fasting). You may have this done every 1-3 years. Abdominal aortic aneurysm (AAA) screening. You may need this if you are a current or former smoker. Osteoporosis. You may be screened starting at age 38 if you are at high risk. Talk with your health care provider about your test results, treatment options, and if necessary, the need for more tests. Vaccines  Your health care  provider may recommend certain vaccines, such as: Influenza vaccine. This is recommended every year. Tetanus, diphtheria, and acellular pertussis (Tdap, Td)  vaccine. You may need a Td booster every 10 years. Zoster vaccine. You may need this after age 12. Pneumococcal 13-valent conjugate (PCV13) vaccine. One dose is recommended after age 76. Pneumococcal polysaccharide (PPSV23) vaccine. One dose is recommended after age 69. Talk to your health care provider about which screenings and vaccines you need and how often you need them. This information is not intended to replace advice given to you by your health care provider. Make sure you discuss any questions you have with your health care provider. Document Released: 11/03/2015 Document Revised: 06/26/2016 Document Reviewed: 08/08/2015 Elsevier Interactive Patient Education  2017 [place] Prevention in the Home Falls can cause injuries. They can happen to people of all ages. There are many things you can do to make your home safe and to help prevent falls. What can I do on the outside of my home? Regularly fix the edges of walkways and driveways and fix any cracks. Remove anything that might make you trip as you walk through a door, such as a raised step or threshold. Trim any bushes or trees on the path to your home. Use bright outdoor lighting. Clear any walking paths of anything that might make someone trip, such as rocks or tools. Regularly check to see if handrails are loose or broken. Make sure that both sides of any steps have handrails. Any raised decks and porches should have guardrails on the edges. Have any leaves, snow, or ice cleared regularly. Use sand or salt on walking paths during winter. Clean up any spills in your garage right away. This includes oil or grease spills. What can I do in the bathroom? Use night lights. Install grab bars by the toilet and in the tub and shower. Do not use towel bars as grab bars. Use non-skid mats or decals in the tub or shower. If you need to sit down in the shower, use a plastic, non-slip stool. Keep the floor dry. Clean up any  water that spills on the floor as soon as it happens. Remove soap buildup in the tub or shower regularly. Attach bath mats securely with double-sided non-slip rug tape. Do not have throw rugs and other things on the floor that can make you trip. What can I do in the bedroom? Use night lights. Make sure that you have a light by your bed that is easy to reach. Do not use any sheets or blankets that are too big for your bed. They should not hang down onto the floor. Have a firm chair that has side arms. You can use this for support while you get dressed. Do not have throw rugs and other things on the floor that can make you trip. What can I do in the kitchen? Clean up any spills right away. Avoid walking on wet floors. Keep items that you use a lot in easy-to-reach places. If you need to reach something above you, use a strong step stool that has a grab bar. Keep electrical cords out of the way. Do not use floor polish or wax that makes floors slippery. If you must use wax, use non-skid floor wax. Do not have throw rugs and other things on the floor that can make you trip. What can I do with my stairs? Do not leave any items on the stairs. Make sure that there are handrails on both  sides of the stairs and use them. Fix handrails that are broken or loose. Make sure that handrails are as long as the stairways. Check any carpeting to make sure that it is firmly attached to the stairs. Fix any carpet that is loose or worn. Avoid having throw rugs at the top or bottom of the stairs. If you do have throw rugs, attach them to the floor with carpet tape. Make sure that you have a light switch at the top of the stairs and the bottom of the stairs. If you do not have them, ask someone to add them for you. What else can I do to help prevent falls? Wear shoes that: Do not have high heels. Have rubber bottoms. Are comfortable and fit you well. Are closed at the toe. Do not wear sandals. If you use a  stepladder: Make sure that it is fully opened. Do not climb a closed stepladder. Make sure that both sides of the stepladder are locked into place. Ask someone to hold it for you, if possible. Clearly mark and make sure that you can see: Any grab bars or handrails. First and last steps. Where the edge of each step is. Use tools that help you move around (mobility aids) if they are needed. These include: Canes. Walkers. Scooters. Crutches. Turn on the lights when you go into a dark area. Replace any light bulbs as soon as they burn out. Set up your furniture so you have a clear path. Avoid moving your furniture around. If any of your floors are uneven, fix them. If there are any pets around you, be aware of where they are. Review your medicines with your doctor. Some medicines can make you feel dizzy. This can increase your chance of falling. Ask your doctor what other things that you can do to help prevent falls. This information is not intended to replace advice given to you by your health care provider. Make sure you discuss any questions you have with your health care provider. Document Released: 08/03/2009 Document Revised: 03/14/2016 Document Reviewed: 11/11/2014 Elsevier Interactive Patient Education  2017 [employer].

## 2021-04-18 DIAGNOSIS — Z85828 Personal history of other malignant neoplasm of skin: Secondary | ICD-10-CM | POA: Diagnosis not present

## 2021-04-18 DIAGNOSIS — D485 Neoplasm of uncertain behavior of skin: Secondary | ICD-10-CM | POA: Diagnosis not present

## 2021-04-18 DIAGNOSIS — C4442 Squamous cell carcinoma of skin of scalp and neck: Secondary | ICD-10-CM | POA: Diagnosis not present

## 2021-04-18 DIAGNOSIS — L821 Other seborrheic keratosis: Secondary | ICD-10-CM | POA: Diagnosis not present

## 2021-04-18 DIAGNOSIS — L570 Actinic keratosis: Secondary | ICD-10-CM | POA: Diagnosis not present

## 2021-04-30 ENCOUNTER — Other Ambulatory Visit: Payer: Self-pay | Admitting: Family Medicine

## 2021-05-10 DIAGNOSIS — D485 Neoplasm of uncertain behavior of skin: Secondary | ICD-10-CM | POA: Diagnosis not present

## 2021-05-10 DIAGNOSIS — L570 Actinic keratosis: Secondary | ICD-10-CM | POA: Diagnosis not present

## 2021-05-10 DIAGNOSIS — Z85828 Personal history of other malignant neoplasm of skin: Secondary | ICD-10-CM | POA: Diagnosis not present

## 2021-05-10 DIAGNOSIS — L821 Other seborrheic keratosis: Secondary | ICD-10-CM | POA: Diagnosis not present

## 2021-05-10 DIAGNOSIS — D044 Carcinoma in situ of skin of scalp and neck: Secondary | ICD-10-CM | POA: Diagnosis not present

## 2021-06-01 ENCOUNTER — Encounter: Payer: Self-pay | Admitting: *Deleted

## 2021-07-02 DIAGNOSIS — H04123 Dry eye syndrome of bilateral lacrimal glands: Secondary | ICD-10-CM | POA: Diagnosis not present

## 2021-07-02 DIAGNOSIS — H40033 Anatomical narrow angle, bilateral: Secondary | ICD-10-CM | POA: Diagnosis not present

## 2021-07-09 ENCOUNTER — Other Ambulatory Visit: Payer: Self-pay | Admitting: Family Medicine

## 2021-07-09 DIAGNOSIS — I2510 Atherosclerotic heart disease of native coronary artery without angina pectoris: Secondary | ICD-10-CM

## 2021-08-28 ENCOUNTER — Ambulatory Visit (INDEPENDENT_AMBULATORY_CARE_PROVIDER_SITE_OTHER): Payer: Medicare Other | Admitting: Family Medicine

## 2021-08-28 ENCOUNTER — Encounter: Payer: Self-pay | Admitting: Family Medicine

## 2021-08-28 VITALS — BP 131/77 | HR 71 | Temp 97.6°F | Ht 63.0 in | Wt 143.0 lb

## 2021-08-28 DIAGNOSIS — S0003XA Contusion of scalp, initial encounter: Secondary | ICD-10-CM | POA: Diagnosis not present

## 2021-08-28 DIAGNOSIS — W19XXXA Unspecified fall, initial encounter: Secondary | ICD-10-CM | POA: Diagnosis not present

## 2021-08-28 DIAGNOSIS — S300XXA Contusion of lower back and pelvis, initial encounter: Secondary | ICD-10-CM

## 2021-08-28 DIAGNOSIS — I2510 Atherosclerotic heart disease of native coronary artery without angina pectoris: Secondary | ICD-10-CM | POA: Diagnosis not present
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
## 2021-09-26 ENCOUNTER — Encounter: Payer: Self-pay | Admitting: Family Medicine

## 2021-09-26 ENCOUNTER — Ambulatory Visit (INDEPENDENT_AMBULATORY_CARE_PROVIDER_SITE_OTHER): Payer: Medicare Other | Admitting: Family Medicine

## 2021-09-26 VITALS — BP 126/88 | HR 68 | Temp 97.5°F | Ht 63.0 in | Wt 143.0 lb

## 2021-09-26 DIAGNOSIS — J208 Acute bronchitis due to other specified organisms: Secondary | ICD-10-CM | POA: Diagnosis not present

## 2021-09-26 DIAGNOSIS — B9689 Other specified bacterial agents as the cause of diseases classified elsewhere: Secondary | ICD-10-CM | POA: Diagnosis not present

## 2021-09-26 DIAGNOSIS — Z20822 Contact with and (suspected) exposure to covid-19: Secondary | ICD-10-CM | POA: Diagnosis not present

## 2021-09-26 MED ORDER — BENZONATATE 100 MG PO CAPS: 100.0000 mg | ORAL_CAPSULE | Freq: Three times a day (TID) | ORAL | 0 refills | Status: DC | PRN

## 2021-09-26 MED ORDER — DOXYCYCLINE HYCLATE 100 MG PO TABS: 100.0000 mg | ORAL_TABLET | Freq: Two times a day (BID) | ORAL | 0 refills | Status: DC

## 2021-09-26 NOTE — Progress Notes (Signed)
Subjective: CC: Cough PCP: Claretta Fraise, MD OIN:OMVEHM Frederick Davis is a 87 y.o. male presenting to clinic today for:  1. Cough Patient reports that he had onset of cough on Thanksgiving.  It has continued since that time and is productive with what he calls purulent sputum.  No reports of shortness of breath, fevers, chest pain.  He has been using over-the-counter products for symptoms.  His wife is sick as well and there is concern for possible COVID-19 but unfortunately she was too weak to leave the home for testing and they were unable to complete home testing.   ROS: Per HPI  Allergies  Allergen Reactions   Lipitor [Atorvastatin] Other (See Comments)    Muscle aches   Past Medical History:  Diagnosis Date   Anemia    Aortic stenosis    a. mild-mod by TEE 11/2015.   Arthritis    left knee    CAD (coronary artery disease)    a. RCA thrombectomy 2014. b. CABGx3 in 11/2015 (EF 45-50% in 11/2015).   Chronic renal insufficiency, stage III (moderate) (HCC)    only has 0ne kidney   Dyspnea    WITH EXERTION   Esophageal stricture YRS AGO   Hypertension    Hypothyroidism    Kidney stone 1969   MI (myocardial infarction) ([place]) 2017   Mitral regurgitation    a. mild by TEE 11/2015.   Myocardial infarction ([place]) 03/12/13   Inferior MI.  LAD mid 80% stenosis, circ mild plaque, RCA 100% with thrombectomy PTCA.  EF 65%   Osteoporosis    PC (prostate cancer) ([place]) 1987   RBBB    Rectum cancer ([place]) 1986   Renal cell cancer ([place]) 2005    Current Outpatient Medications:    acetaminophen (TYLENOL) 500 MG tablet, Take 500 mg by mouth at bedtime., Disp: , Rfl:    aspirin 81 MG tablet, Take 1 tablet (81 mg total) by mouth daily. (Patient taking differently: Take 81 mg by mouth at bedtime.), Disp: 30 tablet, Rfl:    carvedilol (COREG) 3.125 MG tablet, TAKE 1 TABLET TWICE A DAY WITH MEALS (BREAKFAST AND SUPPER), Disp: 180 tablet, Rfl: 1   furosemide (LASIX) 20 MG tablet, TAKE 1 TABLET BY  MOUTH DAILY AS NEEDED., Disp: 90 tablet, Rfl: 0   levothyroxine (SYNTHROID) 75 MCG tablet, Take 1 tablet (75 mcg total) by mouth daily., Disp: 90 tablet, Rfl: 2   lipase/protease/amylase (CREON) 36000 UNITS CPEP capsule, Take 1 capsule by mouth at the first bite of each meal, and 1 capsule during meal, and 1 capsule at the last bite of a meal.  Allow for 3 meals per day.  If eating snacks take 1 capsule with snack., Disp: , Rfl:    meclizine (ANTIVERT) 25 MG tablet, Take 1 tablet (25 mg total) by mouth 3 (three) times daily as needed for dizziness., Disp: 30 tablet, Rfl: 0   Multiple Vitamin (MULTIVITAMIN) capsule, Take 1 capsule daily by mouth., Disp: , Rfl:    Multiple Vitamins-Minerals (ZINC PO), Take by mouth at bedtime., Disp: , Rfl:    nitroGLYCERIN (NITROSTAT) 0.4 MG SL tablet, PLACE 1 TABLET UNDER THE TONGUE AT ONSET OF CHEST PAIN EVERY 5 MINTUES UP TO 3 TIMES AS NEEDED FOR CHEST PAIN, Disp: 25 tablet, Rfl: 2   Probiotic Product (VISBIOME PO), Take 2 capsules by mouth 2 (two) times daily., Disp: , Rfl:    benzonatate (TESSALON PERLES) 100 MG capsule, Take 1 capsule (100 mg total) by mouth  3 (three) times daily as needed., Disp: 20 capsule, Rfl: 0   doxycycline (VIBRA-TABS) 100 MG tablet, Take 1 tablet (100 mg total) by mouth 2 (two) times daily for 7 days., Disp: 14 tablet, Rfl: 0 No current facility-administered medications for this visit.  Facility-Administered Medications Ordered in Other Visits:    etomidate (AMIDATE) injection, , , PRN, Terrill Mohr, CRNA, 10 mg at 03/12/13 1541   lidocaine (cardiac) 100 mg/50ml (XYLOCAINE) 20 MG/ML injection 2%, , , PRN, Terrill Mohr, CRNA, 50 mg at 12/08/15 0853   succinylcholine (ANECTINE) injection, , , PRN, Terrill Mohr, CRNA, 100 mg at 03/12/13 1541 Social History   Socioeconomic History   Marital status: Married    Spouse name: Mikle Bosworth   Number of children: 4   Years of education: 18   Highest education level: Professional school  degree (e.g., MD, DDS, DVM, JD)  Occupational History   Occupation: pharmacist-retired   Occupation: preacher-retired  Tobacco Use   Smoking status: Former    Packs/day: 0.50    Years: 10.00    Pack years: 5.00    Types: Cigarettes    Start date: 10/21/1946    Quit date: 10/21/1968    Years since quitting: 52.9   Smokeless tobacco: Never  Vaping Use   Vaping Use: Never used  Substance and Sexual Activity   Alcohol use: Yes    Alcohol/week: 1.0 standard drink    Types: 1 Standard drinks or equivalent per week    Comment: RARE   Drug use: No   Sexual activity: Not Currently  Other Topics Concern   Not on file  Social History Narrative   Lives home with wife. One child in [place], others live in [place] Resource Strain: Low Risk    Difficulty of Paying Living Expenses: Not hard at all  Food Insecurity: No Food Insecurity   Worried About Charity fundraiser in the Last Year: Never true   Arboriculturist in the Last Year: Never true  Transportation Needs: No Transportation Needs   Lack of Transportation (Medical): No   Lack of Transportation (Non-Medical): No  Physical Activity: Sufficiently Active   Days of Exercise per Week: 3 days   Minutes of Exercise per Session: 50 min  Stress: No Stress Concern Present   Feeling of Stress : Only a little  Social Connections: Engineer, building services of Communication with Friends and Family: More than three times a week   Frequency of Social Gatherings with Friends and Family: More than three times a week   Attends Religious Services: More than 4 times per year   Active Member of [employer] or Organizations: Yes   Attends Music therapist: More than 4 times per year   Marital Status: Married  Human resources officer Violence: Not At Risk   Fear of Current or Ex-Partner: No   Emotionally Abused: No   Physically Abused: No   Sexually Abused: No   Family History  Problem  Relation Age of Onset   Stroke Mother 43   AAA (abdominal aortic aneurysm) Father 65       died from rupture at 6 yo   Arthritis Father    Parkinson's disease Sister    Cancer Son        prostate cancer   Hypertension Son     Objective: Office vital signs reviewed. BP 126/88   Pulse 68   Temp (!)  97.5 F (36.4 C)   Ht 5\' 3"  (1.6 m)   Wt 143 lb (64.9 kg)   SpO2 96%   BMI 25.33 kg/m   Physical Examination:  General: Awake, alert, nontoxic male, No acute distress HEENT: Normal; sclera white Cardio: regular rate and rhythm, S1S2 heard, no murmurs appreciated Pulm: clear to auscultation bilaterally, no wheezes, rhonchi or rales; normal work of breathing on room air  Assessment/ Plan: 87 y.o. male   Suspected COVID-19 virus infection - Plan: Novel Coronavirus, NAA ([facility])  Acute bacterial bronchitis - Plan: benzonatate (TESSALON PERLES) 100 MG capsule, doxycycline (VIBRA-TABS) 100 MG tablet, Novel Coronavirus, NAA ([facility])  I am going to test him for COVID-19 though unsure of the relevance of a positive test at this point since they are out side of the treatment window.  However, given ongoing productive cough that sounds purulent in nature I am going to treat him with oral antibiotics and Tessalon Perles.  His lung exam is unremarkable.  His pulse oximetry and respiratory rate were normal.  He understands red flag signs and symptoms warranting further evaluation will follow up as needed  No orders of the defined types were placed in this encounter.  Meds ordered this encounter  Medications   benzonatate (TESSALON PERLES) 100 MG capsule    Sig: Take 1 capsule (100 mg total) by mouth 3 (three) times daily as needed.    Dispense:  20 capsule    Refill:  0   doxycycline (VIBRA-TABS) 100 MG tablet    Sig: Take 1 tablet (100 mg total) by mouth 2 (two) times daily for 7 days.    Dispense:  14 tablet    Refill:  [place], DO [place] [id number]

## 2021-09-27 LAB — NOVEL CORONAVIRUS, NAA: SARS-CoV-2, NAA: DETECTED — AB

## 2021-10-03 ENCOUNTER — Ambulatory Visit (INDEPENDENT_AMBULATORY_CARE_PROVIDER_SITE_OTHER): Payer: Medicare Other | Admitting: Family Medicine

## 2021-10-03 ENCOUNTER — Encounter: Payer: Self-pay | Admitting: Family Medicine

## 2021-10-03 VITALS — BP 103/66 | HR 64 | Temp 98.1°F | Ht 63.0 in | Wt 140.6 lb

## 2021-10-03 DIAGNOSIS — E782 Mixed hyperlipidemia: Secondary | ICD-10-CM

## 2021-10-03 DIAGNOSIS — I2510 Atherosclerotic heart disease of native coronary artery without angina pectoris: Secondary | ICD-10-CM | POA: Diagnosis not present

## 2021-10-03 DIAGNOSIS — E039 Hypothyroidism, unspecified: Secondary | ICD-10-CM | POA: Diagnosis not present

## 2021-10-03 DIAGNOSIS — I10 Essential (primary) hypertension: Secondary | ICD-10-CM | POA: Diagnosis not present

## 2021-10-03 MED ORDER — MOLNUPIRAVIR EUA 200MG CAPSULE: 4.0000 | ORAL_CAPSULE | Freq: Two times a day (BID) | ORAL | 0 refills | Status: DC

## 2021-10-03 MED ORDER — CARVEDILOL 3.125 MG PO TABS: ORAL_TABLET | ORAL | 1 refills | Status: DC

## 2021-10-03 MED ORDER — LEVOTHYROXINE SODIUM 75 MCG PO TABS: 75.0000 ug | ORAL_TABLET | Freq: Every day | ORAL | 2 refills | Status: DC

## 2021-10-03 MED ORDER — CHERATUSSIN AC 100-10 MG/5ML PO SOLN: 5.0000 mL | ORAL | 0 refills | Status: DC | PRN

## 2021-10-03 NOTE — Progress Notes (Signed)
Subjective:  Patient ID: Frederick Davis, male    DOB: [birth year]  Age: 87 y.o. MRN: [id number]  CC: Medical Management of Chronic Issues   HPI Frederick Davis presents for  presents for  follow-up of hypertension. Patient has no history of headache chest pain or shortness of breath or recent cough. Patient also denies symptoms of TIA such as focal numbness or weakness. Patient denies side effects from medication. States taking it regularly.   follow-up on  thyroid. The patient has a history of hypothyroidism for many years. It has been stable recently. Pt. denies any change in  voice, loss of hair, heat or cold intolerance. Energy level has been adequate to good. Patient denies constipation and diarrhea. No myxedema. Medication is as noted below. Verified that pt is taking it daily on an empty stomach. Well tolerated.   Recent Covid. Sx started on 11/24. No energy. Staying in and staying hydrated. Still coughing a lot.   Depression screen [facility] 2/9 10/03/2021 04/13/2021 04/04/2021  Decreased Interest 0 0 0  Down, Depressed, Hopeless 0 0 0  PHQ - 2 Score 0 0 0  Altered sleeping - - -  Tired, decreased energy - - -  Change in appetite - - -  Feeling bad or failure about yourself  - - -  Trouble concentrating - - -  Moving slowly or fidgety/restless - - -  Suicidal thoughts - - -  PHQ-9 Score - - -  Some recent data might be hidden    History Frederick Davis has a past medical history of Anemia, Aortic stenosis, Arthritis, CAD (coronary artery disease), Chronic renal insufficiency, stage III (moderate) (HCC), Dyspnea, Esophageal stricture (YRS AGO), Hypertension, Hypothyroidism, Kidney stone (1969), MI (myocardial infarction) ([place]) (2017), Mitral regurgitation, Myocardial infarction ([place]) (03/12/13), Osteoporosis, PC (prostate cancer) ([place]) (1987), RBBB, Rectum cancer ([place]) (1986), and Renal cell cancer ([place]) (2005).   Frederick Davis has a past surgical history that includes Kidney surgery (Right, 2004);  Radiofrequency ablation kidney (2006); renal sphincter appliance (2007); Colonoscopy (01/08/2012); Colostomy (Left, 1986); Percutaneous coronary intervention-balloon only; left heart catheterization with coronary angiogram (N/A, 03/12/2013); Prostatectomy; Colonoscopy (N/A, 07/20/2015); Cardiac catheterization (N/A, 12/06/2015); Coronary artery bypass graft (N/A, 12/08/2015); TEE without cardioversion (N/A, 12/08/2015); Hernia repair; Removal of penile prosthesis (N/A, 12/15/2017); and Removal of penile prosthesis (N/A, 02/23/2018).   His family history includes AAA (abdominal aortic aneurysm) (age of onset: 56) in his father; Arthritis in his father; Cancer in his son; Hypertension in his son; Parkinson's disease in his sister; Stroke (age of onset: 29) in his mother.Frederick Davis reports that Frederick Davis quit smoking about 52 years ago. His smoking use included cigarettes. Frederick Davis started smoking about 75 years ago. Frederick Davis has a 5.00 pack-year smoking history. Frederick Davis has never used smokeless tobacco. Frederick Davis reports current alcohol use of about 1.0 standard drink per week. Frederick Davis reports that Frederick Davis does not use drugs.    ROS Review of Systems  Constitutional:  Positive for fatigue. Negative for fever.  HENT: Negative.    Eyes:  Negative for visual disturbance.  Respiratory:  Positive for cough. Negative for shortness of breath.   Cardiovascular:  Negative for chest pain and leg swelling.  Gastrointestinal:  Negative for abdominal pain, diarrhea, nausea and vomiting.  Genitourinary:  Negative for difficulty urinating.  Musculoskeletal:  Negative for arthralgias and myalgias.  Skin:  Negative for rash.  Neurological:  Negative for headaches.  Psychiatric/Behavioral:  Negative for sleep disturbance.    Objective:  BP 103/66    Pulse 64  Temp 98.1 F (36.7 C)    Ht '5\' 3"'  (1.6 m)    Wt 140 lb 9.6 oz (63.8 kg)    SpO2 97%    BMI 24.91 kg/m   BP Readings from Last 3 Encounters:  10/03/21 103/66  09/26/21 126/88  08/28/21 131/77    Wt  Readings from Last 3 Encounters:  10/03/21 140 lb 9.6 oz (63.8 kg)  09/26/21 143 lb (64.9 kg)  08/28/21 143 lb (64.9 kg)     Physical Exam Constitutional:      General: Frederick Davis is not in acute distress.    Appearance: Frederick Davis is well-developed.  HENT:     Head: Normocephalic and atraumatic.     Right Ear: External ear normal.     Left Ear: External ear normal.     Nose: Nose normal.  Eyes:     Conjunctiva/sclera: Conjunctivae normal.     Pupils: Pupils are equal, round, and reactive to light.  Cardiovascular:     Rate and Rhythm: Normal rate and regular rhythm.     Heart sounds: Murmur heard.  Systolic murmur is present with a grade of 3/6.  Pulmonary:     Effort: Pulmonary effort is normal. No respiratory distress.     Breath sounds: Normal breath sounds. No wheezing or rales.  Abdominal:     Palpations: Abdomen is soft.     Tenderness: There is no abdominal tenderness.  Musculoskeletal:        General: Deformity (dorsal kyphisis) present. Normal range of motion.     Cervical back: Normal range of motion and neck supple.     Right lower leg: No edema.     Left lower leg: No edema.  Skin:    General: Skin is warm and dry.  Neurological:     Mental Status: Frederick Davis is alert and oriented to person, place, and time.     Deep Tendon Reflexes: Reflexes are normal and symmetric.  Psychiatric:        Behavior: Behavior normal.        Thought Content: Thought content normal.        Judgment: Judgment normal.      Assessment & Plan:   Frederick Davis was seen today for medical management of chronic issues.  Diagnoses and all orders for this visit:  Hypothyroidism, unspecified type -     TSH + free T4 -     levothyroxine (SYNTHROID) 75 MCG tablet; Take 1 tablet (75 mcg total) by mouth daily. -     CBC with Differential/Platelet -     CMP14+EGFR -     Lipid panel  Mixed hyperlipidemia -     CBC with Differential/Platelet -     CMP14+EGFR -     Lipid panel  Essential hypertension,  benign -     CBC with Differential/Platelet -     CMP14+EGFR -     Lipid panel  Other orders -     carvedilol (COREG) 3.125 MG tablet; TAKE 1 TABLET TWICE A DAY WITH MEALS (BREAKFAST AND SUPPER) -     molnupiravir EUA (LAGEVRIO) 200 mg CAPS capsule; Take 4 capsules (800 mg total) by mouth 2 (two) times daily for 5 days. -     guaiFENesin-codeine (CHERATUSSIN AC) 100-10 MG/5ML syrup; Take 5 mLs by mouth every 4 (four) hours as needed for cough.      I have discontinued [name]. Goodnow's aspirin, acetaminophen, meclizine, and doxycycline. I am also having him start on molnupiravir EUA and Cheratussin  AC. Additionally, I am having him maintain his multivitamin, nitroGLYCERIN, Probiotic Product (VISBIOME PO), lipase/protease/amylase, Multiple Vitamins-Minerals (ZINC PO), furosemide, benzonatate, carvedilol, and levothyroxine.  Allergies as of 10/03/2021       Reactions   Lipitor [atorvastatin] Other (See Comments)   Muscle aches        Medication List        Accurate as of October 03, 2021 10:11 AM. If you have any questions, ask your nurse or doctor.          STOP taking these medications    acetaminophen 500 MG tablet Commonly known as: TYLENOL Stopped by: Claretta Fraise, MD   aspirin 81 MG tablet Stopped by: Claretta Fraise, MD   doxycycline 100 MG tablet Commonly known as: VIBRA-TABS Stopped by: Claretta Fraise, MD   meclizine 25 MG tablet Commonly known as: ANTIVERT Stopped by: Claretta Fraise, MD       TAKE these medications    benzonatate 100 MG capsule Commonly known as: Tessalon Perles Take 1 capsule (100 mg total) by mouth 3 (three) times daily as needed.   carvedilol 3.125 MG tablet Commonly known as: COREG TAKE 1 TABLET TWICE A DAY WITH MEALS (BREAKFAST AND SUPPER)   Cheratussin AC 100-10 MG/5ML syrup Generic drug: guaiFENesin-codeine Take 5 mLs by mouth every 4 (four) hours as needed for cough. Started by: Claretta Fraise, MD   furosemide 20  MG tablet Commonly known as: LASIX TAKE 1 TABLET BY MOUTH DAILY AS NEEDED.   levothyroxine 75 MCG tablet Commonly known as: SYNTHROID Take 1 tablet (75 mcg total) by mouth daily.   lipase/protease/amylase 36000 UNITS Cpep capsule Commonly known as: CREON Take 1 capsule by mouth at the first bite of each meal, and 1 capsule during meal, and 1 capsule at the last bite of a meal.  Allow for 3 meals per day.  If eating snacks take 1 capsule with snack.   molnupiravir EUA 200 mg Caps capsule Commonly known as: LAGEVRIO Take 4 capsules (800 mg total) by mouth 2 (two) times daily for 5 days. Started by: Claretta Fraise, MD   multivitamin capsule Take 1 capsule daily by mouth.   nitroGLYCERIN 0.4 MG SL tablet Commonly known as: NITROSTAT PLACE 1 TABLET UNDER THE TONGUE AT ONSET OF CHEST PAIN EVERY 5 MINTUES UP TO 3 TIMES AS NEEDED FOR CHEST PAIN   VISBIOME PO Take 2 capsules by mouth 2 (two) times daily.   ZINC PO Take by mouth at bedtime.         Follow-up: Return in about 6 months (around 04/03/2022), or if symptoms worsen or fail to improve, for Hypothyroidism.  Claretta Fraise, [name]

## 2021-10-04 ENCOUNTER — Ambulatory Visit: Payer: Medicare Other | Admitting: Family Medicine

## 2021-10-04 LAB — CBC WITH DIFFERENTIAL/PLATELET
Basophils Absolute: 0 10*3/uL (ref 0.0–0.2)
Basos: 1 %
EOS (ABSOLUTE): 0 10*3/uL (ref 0.0–0.4)
Eos: 0 %
Hematocrit: 36.1 % — ABNORMAL LOW (ref 37.5–51.0)
Hemoglobin: 12.6 g/dL — ABNORMAL LOW (ref 13.0–17.7)
Immature Grans (Abs): 0 10*3/uL (ref 0.0–0.1)
Immature Granulocytes: 1 %
Lymphocytes Absolute: 0.9 10*3/uL (ref 0.7–3.1)
Lymphs: 19 %
MCH: 27.7 pg (ref 26.6–33.0)
MCHC: 34.9 g/dL (ref 31.5–35.7)
MCV: 79 fL (ref 79–97)
Monocytes Absolute: 0.5 10*3/uL (ref 0.1–0.9)
Monocytes: 11 %
Neutrophils Absolute: 3.3 10*3/uL (ref 1.4–7.0)
Neutrophils: 68 %
Platelets: 195 10*3/uL (ref 150–450)
RBC: 4.55 x10E6/uL (ref 4.14–5.80)
RDW: 12.7 % (ref 11.6–15.4)
WBC: 4.9 10*3/uL (ref 3.4–10.8)

## 2021-10-04 LAB — LIPID PANEL
Chol/HDL Ratio: 2.1 ratio (ref 0.0–5.0)
Cholesterol, Total: 94 mg/dL — ABNORMAL LOW (ref 100–199)
HDL: 45 mg/dL (ref >39)
LDL Chol Calc (NIH): 34 mg/dL (ref 0–99)
Triglycerides: 66 mg/dL (ref 0–149)
VLDL Cholesterol Cal: 15 mg/dL (ref 5–40)

## 2021-10-04 LAB — CMP14+EGFR
ALT: 14 IU/L (ref 0–44)
AST: 30 IU/L (ref 0–40)
Albumin/Globulin Ratio: 1.2 (ref 1.2–2.2)
Albumin: 3.4 g/dL — ABNORMAL LOW (ref 3.5–4.6)
Alkaline Phosphatase: 84 IU/L (ref 44–121)
BUN/Creatinine Ratio: 19 (ref 10–24)
BUN: 28 mg/dL (ref 10–36)
Bilirubin Total: 0.8 mg/dL (ref 0.0–1.2)
CO2: 20 mmol/L (ref 20–29)
Calcium: 9.6 mg/dL (ref 8.6–10.2)
Chloride: 98 mmol/L (ref 96–106)
Creatinine, Ser: 1.46 mg/dL — ABNORMAL HIGH (ref 0.76–1.27)
Globulin, Total: 2.8 g/dL (ref 1.5–4.5)
Glucose: 95 mg/dL (ref 70–99)
Potassium: 4.6 mmol/L (ref 3.5–5.2)
Sodium: 133 mmol/L — ABNORMAL LOW (ref 134–144)
Total Protein: 6.2 g/dL (ref 6.0–8.5)
eGFR: 44 mL/min/{1.73_m2} — ABNORMAL LOW (ref >59)

## 2021-10-04 LAB — TSH+FREE T4
Free T4: 1.58 ng/dL (ref 0.82–1.77)
TSH: 1.36 u[IU]/mL (ref 0.450–4.500)

## 2021-10-07 ENCOUNTER — Emergency Department (HOSPITAL_COMMUNITY): Payer: Medicare Other

## 2021-10-07 ENCOUNTER — Observation Stay (HOSPITAL_COMMUNITY)
Admission: EM | Admit: 2021-10-07 | Discharge: 2021-10-08 | Disposition: A | Discharge status: Home / Self Care | Payer: Medicare Other | Attending: Internal Medicine | Admitting: Internal Medicine

## 2021-10-07 ENCOUNTER — Encounter (HOSPITAL_COMMUNITY): Payer: Self-pay

## 2021-10-07 ENCOUNTER — Other Ambulatory Visit: Payer: Self-pay

## 2021-10-07 DIAGNOSIS — Z79899 Other long term (current) drug therapy: Secondary | ICD-10-CM | POA: Insufficient documentation

## 2021-10-07 DIAGNOSIS — Z28310 Unvaccinated for covid-19: Secondary | ICD-10-CM | POA: Insufficient documentation

## 2021-10-07 DIAGNOSIS — R55 Syncope and collapse: Secondary | ICD-10-CM

## 2021-10-07 DIAGNOSIS — Z85048 Personal history of other malignant neoplasm of rectum, rectosigmoid junction, and anus: Secondary | ICD-10-CM | POA: Insufficient documentation

## 2021-10-07 DIAGNOSIS — U071 COVID-19: Secondary | ICD-10-CM | POA: Diagnosis present

## 2021-10-07 DIAGNOSIS — J189 Pneumonia, unspecified organism: Secondary | ICD-10-CM | POA: Diagnosis not present

## 2021-10-07 DIAGNOSIS — R42 Dizziness and giddiness: Secondary | ICD-10-CM | POA: Diagnosis not present

## 2021-10-07 DIAGNOSIS — J1282 Pneumonia due to coronavirus disease 2019: Secondary | ICD-10-CM | POA: Diagnosis not present

## 2021-10-07 DIAGNOSIS — I4891 Unspecified atrial fibrillation: Secondary | ICD-10-CM | POA: Diagnosis not present

## 2021-10-07 DIAGNOSIS — I2510 Atherosclerotic heart disease of native coronary artery without angina pectoris: Secondary | ICD-10-CM | POA: Insufficient documentation

## 2021-10-07 DIAGNOSIS — I350 Nonrheumatic aortic (valve) stenosis: Secondary | ICD-10-CM | POA: Diagnosis not present

## 2021-10-07 DIAGNOSIS — N183 Chronic kidney disease, stage 3 unspecified: Secondary | ICD-10-CM | POA: Diagnosis not present

## 2021-10-07 DIAGNOSIS — S0990XA Unspecified injury of head, initial encounter: Secondary | ICD-10-CM | POA: Diagnosis not present

## 2021-10-07 DIAGNOSIS — Z87891 Personal history of nicotine dependence: Secondary | ICD-10-CM | POA: Diagnosis not present

## 2021-10-07 DIAGNOSIS — I499 Cardiac arrhythmia, unspecified: Secondary | ICD-10-CM | POA: Diagnosis not present

## 2021-10-07 DIAGNOSIS — Z951 Presence of aortocoronary bypass graft: Secondary | ICD-10-CM | POA: Diagnosis not present

## 2021-10-07 DIAGNOSIS — Z743 Need for continuous supervision: Secondary | ICD-10-CM | POA: Diagnosis not present

## 2021-10-07 DIAGNOSIS — R531 Weakness: Secondary | ICD-10-CM | POA: Diagnosis not present

## 2021-10-07 DIAGNOSIS — I129 Hypertensive chronic kidney disease with stage 1 through stage 4 chronic kidney disease, or unspecified chronic kidney disease: Secondary | ICD-10-CM | POA: Insufficient documentation

## 2021-10-07 DIAGNOSIS — Z8546 Personal history of malignant neoplasm of prostate: Secondary | ICD-10-CM | POA: Insufficient documentation

## 2021-10-07 DIAGNOSIS — I10 Essential (primary) hypertension: Secondary | ICD-10-CM | POA: Diagnosis present

## 2021-10-07 DIAGNOSIS — R0689 Other abnormalities of breathing: Secondary | ICD-10-CM | POA: Diagnosis not present

## 2021-10-07 DIAGNOSIS — Z933 Colostomy status: Secondary | ICD-10-CM

## 2021-10-07 DIAGNOSIS — R059 Cough, unspecified: Secondary | ICD-10-CM | POA: Diagnosis not present

## 2021-10-07 DIAGNOSIS — C61 Malignant neoplasm of prostate: Secondary | ICD-10-CM | POA: Diagnosis present

## 2021-10-07 DIAGNOSIS — Z85528 Personal history of other malignant neoplasm of kidney: Secondary | ICD-10-CM | POA: Insufficient documentation

## 2021-10-07 DIAGNOSIS — J329 Chronic sinusitis, unspecified: Secondary | ICD-10-CM | POA: Diagnosis not present

## 2021-10-07 DIAGNOSIS — I672 Cerebral atherosclerosis: Secondary | ICD-10-CM | POA: Diagnosis not present

## 2021-10-07 DIAGNOSIS — E039 Hypothyroidism, unspecified: Secondary | ICD-10-CM | POA: Insufficient documentation

## 2021-10-07 DIAGNOSIS — I6523 Occlusion and stenosis of bilateral carotid arteries: Secondary | ICD-10-CM | POA: Diagnosis not present

## 2021-10-07 LAB — CBC WITH DIFFERENTIAL/PLATELET
Abs Immature Granulocytes: 0.03 10*3/uL (ref 0.00–0.07)
Basophils Absolute: 0 10*3/uL (ref 0.0–0.1)
Basophils Relative: 0 %
Eosinophils Absolute: 0.1 10*3/uL (ref 0.0–0.5)
Eosinophils Relative: 1 %
HCT: 36.5 % — ABNORMAL LOW (ref 39.0–52.0)
Hemoglobin: 11.6 g/dL — ABNORMAL LOW (ref 13.0–17.0)
Immature Granulocytes: 0 %
Lymphocytes Relative: 15 %
Lymphs Abs: 1.1 10*3/uL (ref 0.7–4.0)
MCH: 27.9 pg (ref 26.0–34.0)
MCHC: 31.8 g/dL (ref 30.0–36.0)
MCV: 87.7 fL (ref 80.0–100.0)
Monocytes Absolute: 0.7 10*3/uL (ref 0.1–1.0)
Monocytes Relative: 10 %
Neutro Abs: 5.1 10*3/uL (ref 1.7–7.7)
Neutrophils Relative %: 74 %
Platelets: 248 10*3/uL (ref 150–400)
RBC: 4.16 MIL/uL — ABNORMAL LOW (ref 4.22–5.81)
RDW: 14.5 % (ref 11.5–15.5)
WBC: 6.9 10*3/uL (ref 4.0–10.5)
nRBC: 0 % (ref 0.0–0.2)

## 2021-10-07 LAB — COMPREHENSIVE METABOLIC PANEL
ALT: 13 U/L (ref 0–44)
AST: 22 U/L (ref 15–41)
Albumin: 2.7 g/dL — ABNORMAL LOW (ref 3.5–5.0)
Alkaline Phosphatase: 63 U/L (ref 38–126)
Anion gap: 7 (ref 5–15)
BUN: 35 mg/dL — ABNORMAL HIGH (ref 8–23)
CO2: 24 mmol/L (ref 22–32)
Calcium: 8.9 mg/dL (ref 8.9–10.3)
Chloride: 102 mmol/L (ref 98–111)
Creatinine, Ser: 1.28 mg/dL — ABNORMAL HIGH (ref 0.61–1.24)
GFR, Estimated: 51 mL/min — ABNORMAL LOW (ref >60)
Glucose, Bld: 92 mg/dL (ref 70–99)
Potassium: 4.3 mmol/L (ref 3.5–5.1)
Sodium: 133 mmol/L — ABNORMAL LOW (ref 135–145)
Total Bilirubin: 0.8 mg/dL (ref 0.3–1.2)
Total Protein: 5.4 g/dL — ABNORMAL LOW (ref 6.5–8.1)

## 2021-10-07 LAB — LACTATE DEHYDROGENASE: LDH: 138 U/L (ref 98–192)

## 2021-10-07 LAB — TSH: TSH: 0.915 u[IU]/mL (ref 0.350–4.500)

## 2021-10-07 LAB — URINALYSIS, ROUTINE W REFLEX MICROSCOPIC
Bacteria, UA: NONE SEEN
Bilirubin Urine: NEGATIVE
Glucose, UA: NEGATIVE mg/dL
Hgb urine dipstick: NEGATIVE
Ketones, ur: NEGATIVE mg/dL
Leukocytes,Ua: NEGATIVE
Nitrite: NEGATIVE
Protein, ur: 30 mg/dL — AB
Specific Gravity, Urine: 1.017 (ref 1.005–1.030)
pH: 5 (ref 5.0–8.0)

## 2021-10-07 LAB — LACTIC ACID, PLASMA
Lactic Acid, Venous: 1.2 mmol/L (ref 0.5–1.9)
Lactic Acid, Venous: 2.1 mmol/L (ref 0.5–1.9)
Lactic Acid, Venous: 1.4 mmol/L (ref 0.5–1.9)

## 2021-10-07 LAB — CK: Total CK: 21 U/L — ABNORMAL LOW (ref 49–397)

## 2021-10-07 LAB — FERRITIN: Ferritin: 226 ng/mL (ref 24–336)

## 2021-10-07 LAB — TROPONIN I (HIGH SENSITIVITY)
Troponin I (High Sensitivity): 166 ng/L (ref <18)
Troponin I (High Sensitivity): 150 ng/L (ref <18)

## 2021-10-07 LAB — MAGNESIUM: Magnesium: 2 mg/dL (ref 1.7–2.4)

## 2021-10-07 LAB — PROCALCITONIN: Procalcitonin: <0.1 ng/mL

## 2021-10-07 LAB — D-DIMER, QUANTITATIVE: D-Dimer, Quant: 1.97 ug/mL-FEU — ABNORMAL HIGH (ref 0.00–0.50)

## 2021-10-07 MED ORDER — SODIUM CHLORIDE 0.9 % IV BOLUS
500.0000 mL | Freq: Once | INTRAVENOUS | Status: AC
  Administered 2021-10-07: 21:00:00 500 mL via INTRAVENOUS

## 2021-10-07 MED ORDER — PANCRELIPASE (LIP-PROT-AMYL) 36000-114000 UNITS PO CPEP
108000.0000 [IU] | ORAL_CAPSULE | Freq: Three times a day (TID) | ORAL | Status: DC
  Administered 2021-10-08 (×2): 108000 [IU] via ORAL
  Filled 2021-10-07 (×2): qty 3

## 2021-10-07 MED ORDER — SODIUM CHLORIDE 0.9 % IV SOLN
500.0000 mg | INTRAVENOUS | Status: DC
  Administered 2021-10-07: 18:00:00 500 mg via INTRAVENOUS
  Filled 2021-10-07: qty 5

## 2021-10-07 MED ORDER — SODIUM CHLORIDE 0.9 % IV SOLN
1.0000 g | Freq: Once | INTRAVENOUS | Status: AC
  Administered 2021-10-07: 17:00:00 1 g via INTRAVENOUS
  Filled 2021-10-07: qty 10

## 2021-10-07 MED ORDER — CARVEDILOL 3.125 MG PO TABS
3.1250 mg | ORAL_TABLET | Freq: Two times a day (BID) | ORAL | Status: DC
  Administered 2021-10-07 – 2021-10-08 (×2): 3.125 mg via ORAL
  Filled 2021-10-07 (×2): qty 1

## 2021-10-07 MED ORDER — HEPARIN SODIUM (PORCINE) 5000 UNIT/ML IJ SOLN
5000.0000 [IU] | Freq: Three times a day (TID) | INTRAMUSCULAR | Status: DC
  Administered 2021-10-07 – 2021-10-08 (×2): 5000 [IU] via SUBCUTANEOUS
  Filled 2021-10-07 (×2): qty 1

## 2021-10-07 MED ORDER — POLYETHYLENE GLYCOL 3350 17 G PO PACK: 17.0000 g | PACK | Freq: Every day | ORAL | Status: DC | PRN

## 2021-10-07 MED ORDER — SODIUM CHLORIDE 0.9 % IV SOLN: INTRAVENOUS | Status: DC

## 2021-10-07 MED ORDER — SODIUM CHLORIDE 0.9 % IV BOLUS
500.0000 mL | Freq: Once | INTRAVENOUS | Status: AC
  Administered 2021-10-07: 15:00:00 500 mL via INTRAVENOUS

## 2021-10-07 MED ORDER — ONDANSETRON HCL 4 MG PO TABS: 4.0000 mg | ORAL_TABLET | Freq: Four times a day (QID) | ORAL | Status: DC | PRN

## 2021-10-07 MED ORDER — LEVOTHYROXINE SODIUM 75 MCG PO TABS
75.0000 ug | ORAL_TABLET | Freq: Every day | ORAL | Status: DC
  Administered 2021-10-08: 05:00:00 75 ug via ORAL
  Filled 2021-10-07: qty 1

## 2021-10-07 MED ORDER — ONDANSETRON HCL 4 MG/2ML IJ SOLN: 4.0000 mg | Freq: Four times a day (QID) | INTRAMUSCULAR | Status: DC | PRN

## 2021-10-07 NOTE — Progress Notes (Signed)
Per MD will trend troponin for now.

## 2021-10-07 NOTE — ED Provider Notes (Signed)
[facility] Provider Note   CSN: [id number] Arrival date & time: 10/07/21  1312     History Chief Complaint  Patient presents with   Dehydration    Frederick Davis is a 87 y.o. male.  HPI  Patient with medical history including CAD, CABG, hypertension, CAD x3 chronic dizziness, colon cancer status post ileostomy presents to the emergency department with failure to thrive.  Patient has no complaints this time, he states that he just feels more fatigued, he states he is going for last couple weeks, states he has no appetite but does not endorse any fevers, chills, nasal congestion, sore throat, cough, chest pain, shortness of breath,  abdominal pain, nausea, vomit, denies melena or hematochezia, states he has had slightly decreased ileostomy output, no urinary symptoms  patient's son and wife are at bedside validated the story, they state that since Thanksgiving he has not been eating or drinking very much, and he seems just more fatigued, there is been no trauma, no changes in medications.  They do note about 2 weeks ago patient was having some URI-like symptoms and was diagnosed with COVID but it seems the patient is not fully recovered yet.  They also note that today while patient was having bowel movement he had a syncopal episode, they found him on the side toilet, fell onto his left side, there is no shaking, no tongue biting, no urinary incontinency, patient states that he is having a bowel movement felt dizzy and fell over, he has no complaints this time.  States he has a abrasion on his left elbow and knee but has no other complaints.  He denies feeling chest pain, shortness of breath, no headaches, change in vision, paresthesia or weakness of the lower extremities.  Patient states he feels just fine at the moment.    Past Medical History:  Diagnosis Date   Anemia    Aortic stenosis    a. mild-mod by TEE 11/2015.   Arthritis    left knee    CAD (coronary  artery disease)    a. RCA thrombectomy 2014. b. CABGx3 in 11/2015 (EF 45-50% in 11/2015).   Chronic renal insufficiency, stage III (moderate) (HCC)    only has 0ne kidney   Dyspnea    WITH EXERTION   Esophageal stricture YRS AGO   Hypertension    Hypothyroidism    Kidney stone 1969   MI (myocardial infarction) ([place]) 2017   Mitral regurgitation    a. mild by TEE 11/2015.   Myocardial infarction ([place]) 03/12/13   Inferior MI.  LAD mid 80% stenosis, circ mild plaque, RCA 100% with thrombectomy PTCA.  EF 65%   Osteoporosis    PC (prostate cancer) ([place]) 1987   RBBB    Rectum cancer ([place]) 1986   Renal cell cancer ([place]) 2005    Patient Active Problem List   Diagnosis Date Noted   New onset atrial fibrillation ([place]) 10/07/2021   Hypothyroidism 04/04/2020   Calculus of gallbladder without cholecystitis without obstruction 11/30/2019   Exocrine pancreatic insufficiency 11/30/2019   Educated about COVID-19 virus infection 11/22/2019   Diarrhea in adult patient 06/30/2019   Cuff erosion of artificial urinary sphincter ([place]) 02/23/2018   Malfunction of penile prosthesis ([facility]) 12/15/2017   Renal cell carcinoma, right ([place]) 08/11/2017   Vitamin D deficiency 08/11/2017   ASCVD (arteriosclerotic cardiovascular disease) 08/11/2017   Aortic stenosis 05/28/2017   Mitral regurgitation 05/28/2017   Chest pain 05/27/2017   Coronary artery disease involving  native coronary artery of native heart without angina pectoris 03/19/2017   Dyslipidemia 03/19/2017   S/P CABG x 3 12/08/2015   Unstable angina (HCC)    Chest pain with moderate risk of acute coronary syndrome 12/05/2015   Aortic stenosis, mild-2014 12/05/2015   Anginal pain ([place]) 10/17/2015   History of renal cell cancer-s/p nephrectomy 07/19/2013   Hyperlipidemia 07/19/2013   Hypotension arterial 03/18/2013   Complete heart block ([place]) 03/18/2013   Anemia    CKD (chronic kidney disease) stage 3, GFR 30-59 ml/min (HCC) 03/12/2013   Acute  MI, inferior wall, initial episode of care ([place]) 03/12/2013   Impotence 03/12/2013   History of renal stone 03/12/2013   Hiatal hernia 03/12/2013   H/O lithotripsy 03/12/2013   SCCA (squamous cell carcinoma) of skin 03/12/2013   S/P prostatectomy, radical 03/12/2013   Left renal mass 03/12/2013   Arthritis 03/12/2013   History of STEMI- May 2014 03/12/2013   CAD S/P RCA thrombectomy May 2014, CABG 11/2015 03/12/2013   Cardiogenic shock ([place]) 03/12/2013   Acute respiratory failure ([place]) 03/12/2013   Altered mental status 03/12/2013   Essential hypertension, benign 01/14/2013   Colostomy in place [facility]) 01/14/2013   Prostate cancer ([place]) 01/14/2013   Renal cell carcinoma, Right 01/14/2013   Osteoporosis, unspecified 01/14/2013    Past Surgical History:  Procedure Laterality Date   CARDIAC CATHETERIZATION N/A 12/06/2015   Procedure: Left Heart Cath and Coronary Angiography;  Surgeon: Burnell Blanks, MD;  Location: [facility];  Service: Cardiovascular;  Laterality: N/A;   COLONOSCOPY  01/08/2012   Procedure: COLONOSCOPY;  Surgeon: Rogene Houston, MD;  [facility];  Service: Endoscopy;  Laterality: N/A;  1030   COLONOSCOPY N/A 07/20/2015   Procedure: COLONOSCOPY;  Surgeon: Rogene Houston, MD;  [facility];  Service: Endoscopy;  Laterality: N/A;  930 - moved to 9/29 @ 2:00   COLOSTOMY Left 1986   colon cancer   CORONARY ARTERY BYPASS GRAFT N/A 12/08/2015   Procedure: CORONARY ARTERY BYPASS GRAFTING (CABG);  Surgeon: Melrose Nakayama, MD;  Location: [facility];  Service: Open Heart Surgery;  Laterality: N/A;  Times 3 using left internal mammary artery and endoscopycally harvested bilateral saphenous vein.   HERNIA REPAIR     2007 RIGHT INGUINAL HERNIA   KIDNEY SURGERY Right 2004   LEFT HEART CATHETERIZATION WITH CORONARY ANGIOGRAM N/A 03/12/2013   Procedure: STEMI;  Surgeon: Burnell Blanks, MD;  Location: [facility] CATH LAB;  Service: Cardiovascular;   Laterality: N/A;   PERCUTANEOUS CORONARY INTERVENTION-[place] ONLY     PROSTATECTOMY     RADIOFREQUENCY ABLATION KIDNEY  2006   REMOVAL OF PENILE PROSTHESIS N/A 12/15/2017   Procedure: REMOVAL OF PENILE PROSTHESIS;  Surgeon: Franchot Gallo, MD;  Location: [facility];  Service: Urology;  Laterality: N/A;   REMOVAL OF PENILE PROSTHESIS N/A 02/23/2018   Procedure: [name] OF ERODED SPHINCTER;  Surgeon: Franchot Gallo, MD;  Location: [facility];  Service: Urology;  Laterality: N/A;   renal sphincter appliance  2007   TEE WITHOUT CARDIOVERSION N/A 12/08/2015   Procedure: TRANSESOPHAGEAL ECHOCARDIOGRAM (TEE);  Surgeon: Melrose Nakayama, MD;  Location: [facility];  Service: Open Heart Surgery;  Laterality: N/A;       Family History  Problem Relation Age of Onset   Stroke Mother 50   AAA (abdominal aortic aneurysm) Father 73       died from rupture at 69 yo   Arthritis Father    Parkinson's disease Sister  Cancer Son        prostate cancer   Hypertension Son     Social History   Tobacco Use   Smoking status: Former    Packs/day: 0.50    Years: 10.00    Pack years: 5.00    Types: Cigarettes    Start date: 10/21/1946    Quit date: 10/21/1968    Years since quitting: 52.9   Smokeless tobacco: Never  Vaping Use   Vaping Use: Never used  Substance Use Topics   Alcohol use: Yes    Alcohol/week: 1.0 standard drink    Types: 1 Standard drinks or equivalent per week    Comment: RARE   Drug use: No    Home Medications Prior to Admission medications   Medication Sig Start Date End Date Taking? Authorizing Provider  benzonatate (TESSALON PERLES) 100 MG capsule Take 1 capsule (100 mg total) by mouth 3 (three) times daily as needed. 09/26/21  Yes Gottschalk, Ashly M, DO  carvedilol (COREG) 3.125 MG tablet TAKE 1 TABLET TWICE A DAY WITH MEALS (BREAKFAST AND SUPPER) 10/03/21  Yes Stacks, Cletus Gash, MD  furosemide (LASIX) 20 MG tablet TAKE 1 TABLET BY MOUTH DAILY AS NEEDED. 07/09/21  Yes Stacks,  Cletus Gash, MD  guaiFENesin-codeine (CHERATUSSIN AC) 100-10 MG/5ML syrup Take 5 mLs by mouth every 4 (four) hours as needed for cough. 10/03/21  Yes Stacks, Cletus Gash, MD  levothyroxine (SYNTHROID) 75 MCG tablet Take 1 tablet (75 mcg total) by mouth daily. 10/03/21  Yes Claretta Fraise, MD  lipase/protease/amylase (CREON) 36000 UNITS CPEP capsule Take 1 capsule by mouth at the first bite of each meal, and 1 capsule during meal, and 1 capsule at the last bite of a meal.  Allow for 3 meals per day.  If eating snacks take 1 capsule with snack. 11/02/19  Yes [provider]  meclizine (ANTIVERT) 25 MG tablet Take 25 mg by mouth 3 (three) times daily as needed for dizziness.   Yes [provider]  Multiple Vitamin (MULTIVITAMIN) capsule Take 1 capsule daily by mouth.   Yes [provider]  Multiple Vitamins-Minerals (ZINC PO) Take by mouth at bedtime.   Yes [provider]  nitroGLYCERIN (NITROSTAT) 0.4 MG SL tablet PLACE 1 TABLET UNDER THE TONGUE AT ONSET OF CHEST PAIN EVERY 5 MINTUES UP TO 3 TIMES AS NEEDED FOR CHEST PAIN 01/04/19  Yes Almyra Deforest, PA  molnupiravir EUA (LAGEVRIO) 200 mg CAPS capsule Take 4 capsules (800 mg total) by mouth 2 (two) times daily for 5 days. Patient not taking: Reported on 10/07/2021 10/03/21 10/08/21  Claretta Fraise, MD  Probiotic Product (VISBIOME PO) Take 2 capsules by mouth 2 (two) times daily. Patient not taking: Reported on 10/07/2021    [provider]    Allergies    Lipitor [atorvastatin]  Review of Systems   Review of Systems  Constitutional:  Positive for appetite change and fatigue. Negative for chills and fever.  HENT:  Negative for congestion.   Respiratory:  Negative for shortness of breath.   Cardiovascular:  Negative for chest pain.  Gastrointestinal:  Negative for abdominal pain.  Genitourinary:  Negative for enuresis.  Musculoskeletal:  Negative for back pain.  Skin:  Negative for rash.  Neurological:  Negative  for dizziness.  Hematological:  Does not bruise/bleed easily.   Physical Exam Updated Vital Signs BP 117/64    Pulse 68    Temp 97.6 F (36.4 C) (Oral)    Resp 17    Ht 5\' 4"  (  1.626 m)    Wt 65.3 kg    SpO2 95%    BMI 24.72 kg/m   Physical Exam Vitals and nursing note reviewed.  Constitutional:      General: He is not in acute distress.    Appearance: He is not ill-appearing.  HENT:     Head: Normocephalic and atraumatic.     Comments: No deformity to head present, no raccoon eyes or battle sign noted.    Nose: No congestion.     Mouth/Throat:     Mouth: Mucous membranes are dry.     Pharynx: Oropharynx is clear. No oropharyngeal exudate or posterior oropharyngeal erythema.  Eyes:     Extraocular Movements: Extraocular movements intact.     Conjunctiva/sclera: Conjunctivae normal.     Pupils: Pupils are equal, round, and reactive to light.  Cardiovascular:     Rate and Rhythm: Normal rate and regular rhythm.     Pulses: Normal pulses.     Heart sounds: No murmur heard.   No friction rub. No gallop.  Pulmonary:     Effort: No respiratory distress.     Breath sounds: No wheezing, rhonchi or rales.  Abdominal:     Palpations: Abdomen is soft.     Tenderness: There is no abdominal tenderness. There is no right CVA tenderness or left CVA tenderness.     Comments: Patient is noted ostomy, no surrounding erythema, limited amount of stool present no melena or hematuria noted.  Abdomen is nontender to palpation, no guarding, rebound times, peritoneal sign.  Musculoskeletal:     Comments: Moving all 4 extremities, 5 of 5 strength, neurovascularly intact.  Skin:    General: Skin is warm and dry.     Comments: Small skin tear noted on the left elbow, hemodynamically stable.  Neurological:     Mental Status: He is alert.     GCS: GCS eye subscore is 4. GCS verbal subscore is 5. GCS motor subscore is 6.     Cranial Nerves: Cranial nerves 2-12 are intact. No cranial nerve deficit.      Sensory: Sensation is intact.     Motor: No weakness.     Coordination: Romberg sign negative. Finger-Nose-Finger Test normal.     Comments: No facial asymmetry, no diffuse word finding, able follow two-step commands, no unilateral weakness present, sensation fully intact.  Psychiatric:        Mood and Affect: Mood normal.    ED Results / Procedures / Treatments   Labs (all labs ordered are listed, but only abnormal results are displayed) Labs Reviewed  COMPREHENSIVE METABOLIC PANEL - Abnormal; Notable for the following components:      Result Value   Sodium 133 (*)    BUN 35 (*)    Creatinine, Ser 1.28 (*)    Total Protein 5.4 (*)    Albumin 2.7 (*)    GFR, Estimated 51 (*)    All other components within normal limits  CBC WITH DIFFERENTIAL/PLATELET - Abnormal; Notable for the following components:   RBC 4.16 (*)    Hemoglobin 11.6 (*)    HCT 36.5 (*)    All other components within normal limits  URINALYSIS, ROUTINE W REFLEX MICROSCOPIC - Abnormal; Notable for the following components:   Protein, ur 30 (*)    All other components within normal limits  LACTIC ACID, PLASMA - Abnormal; Notable for the following components:   Lactic Acid, Venous 2.1 (*)    All other components within normal limits  CULTURE, BLOOD (ROUTINE X 2)  CULTURE, BLOOD (ROUTINE X 2)  LACTIC ACID, PLASMA    EKG EKG Interpretation  Date/Time:  Sunday October 07 2021 15:05:53 EST Ventricular Rate:  61 PR Interval:    QRS Duration: 155 QT Interval:  460 QTC Calculation: 464 R Axis:   -47 Text Interpretation: Atrial fibrillation IVCD, consider atypical RBBB Since last tracing of earlier today No significant change was found Confirmed by Daleen Bo [phone]) on 10/07/2021 3:12:50 PM  Radiology CT Head Wo Contrast  Result Date: 10/07/2021 CLINICAL DATA:  Head trauma, fall in the bathroom last night where EXAM: CT HEAD WITHOUT CONTRAST TECHNIQUE: Contiguous axial images were obtained from the base  of the skull through the vertex without intravenous contrast. COMPARISON:  None. FINDINGS: Brain: No evidence of acute infarction, hemorrhage, hydrocephalus, extra-axial collection or mass lesion/mass effect. Prominence of the ventricles and sulci secondary to moderate cerebral volume loss. Diffuse low-attenuation of the periventricular and subcortical white matter presumed advanced chronic microvascular ischemic changes. Vascular: Atherosclerotic calcification of bilateral vertebral arteries and carotid siphons. Skull: Normal. Negative for fracture or focal lesion. Sinuses/Orbits: Mucous retention cyst in the left maxillary sinus. Bilateral cataract surgery. Other: None. IMPRESSION: 1.  No acute intracranial abnormality. 2. Advanced cerebral volume loss and chronic microvascular ischemic changes of the white matter. 3.  Mild paranasal sinus disease. Electronically Signed   By: Keane Police D.O.   On: 10/07/2021 15:22   DG Chest Port 1 View  Result Date: 10/07/2021 CLINICAL DATA:  Family visited patient and noted weight loss and noticed signs of dehydration. Patient told EMS that he fell last night in the bathroom after feeling dizzy and passing out. Pt c/o cough. Hx of HTN EXAM: PORTABLE CHEST 1 VIEW COMPARISON:  01/14/2018 and older studies. FINDINGS: Previous CABG surgery. Cardiac silhouette borderline enlarged. No mediastinal or hilar masses. Previously noted hiatal hernia is not defined, but likely unchanged. There are bilateral interstitial and patchy airspace opacities that are new. No visualized pleural effusion.  No pneumothorax. Skeletal structures are grossly intact. IMPRESSION: 1. Patchy bilateral interstitial and airspace lung opacities new compared to the prior exam. Findings consistent with multifocal pneumonia with atypical etiologies, including viral pneumonia, in the differential diagnosis. Electronically Signed   By: Lajean Manes [name]   On: 10/07/2021 14:49    Procedures Procedures    Medications Ordered in ED Medications  azithromycin (ZITHROMAX) 500 mg in sodium chloride 0.9 % 250 mL IVPB (500 mg Intravenous New Bag/Given 10/07/21 1753)  sodium chloride 0.9 % bolus 500 mL (0 mLs Intravenous Stopped 10/07/21 1556)  cefTRIAXone (ROCEPHIN) 1 g in sodium chloride 0.9 % 100 mL IVPB (0 g Intravenous Stopped 10/07/21 1729)    ED Course  I have reviewed the triage vital signs and the nursing notes.  Pertinent labs & imaging results that were available during my care of the patient were reviewed by me and considered in my medical decision making (see chart for details).  Clinical Course as of 10/07/21 4034  Nancy Fetter Oct 07, 7230  [id number] 87 year old male who has been declining since being sick with COVID over Thanksgiving.  Not eating.  Generally weak.  Your chest x-ray concerning for multifocal pneumonia.  Needs blood cultures antibiotics and admission to the hospital for further work-up. [MB]    Clinical Course User Index [MB] Hayden Rasmussen, MD   MDM Rules/Calculators/A&P  Initial impression-presents with syncopal episodes, failure to thrive.  He is alert, no acute stress, vital signs reassuring.  Unclear etiology will obtain basic lab work-up, CT head for fall and reassess.  We will also problems with some fluids as he appears dry on my exam.  Work-up-CBC shows normocytic anemia hemoglobin 11.6, CMP shows sodium of 133, BUN of 35, creatinine 1.28, GFR 51, lactic 1.2, CT head negative for acute findings, chest x-ray shows patchy bilateral interstitial and airspace lung opacities consistent multifocal pneumonia atypical etiology.  EKG shows new onset of A. fib without signs of ischemia.  Reassessment-patient is reassessed he states that he has no history of A. fib, he states he has no chest pain no shortness of breath has no other complaints.  We will just continue to monitor this.  Chest ray consistent with multifocal pneumonia possible his fatigue is  multiple new onset of A. fib as well as multifocal pneumonia, will start him on antibiotics at this time.  We will hold off on anticoagulant discussed this with hospitalist.  Recommend admission due to new onset A. fib as well as multifocal pneumonia patient is agreement this plan will consult hospitalist team.  Consult-spoke with Dr. Denton Brick who will admit the patient   Rule out-low suspicion for CVA and/or intracranial head bleed as there is no focal deficits present on exam, CT imaging is negative for acute findings.  I have low suspicion for sepsis as patient is nontoxic-appearing, vital signs reassuring, no leukocytosis, lactic within normal limits.  Low suspicion for ACS as patient has chest pain, shortness of breath, EKG is without signs of ischemia.  Low suspicion for abdominal abnormality abdomen soft nontender palpation, no signs of surgical abdomen, no nausea or vomiting lab work is reassuring.  Plan-admission  Multifocal pneumonia-requiring IV antibiotics and continue monitoring. New onset A. Fib-discussion about pros and cons of anticoagulant and possible medications for rate control.     Final Clinical Impression(s) / ED Diagnoses Final diagnoses:  Community acquired pneumonia, unspecified laterality  Atrial fibrillation, unspecified type [facility])    Rx / DC Orders ED Discharge Orders     None        Marcello Fennel, PA-C 10/07/21 1833    Hayden Rasmussen, MD 10/08/21 [phone]

## 2021-10-07 NOTE — ED Notes (Signed)
Critical Lab: Lactic Acid 2.1.  Provider made aware.

## 2021-10-07 NOTE — Progress Notes (Signed)
Lab called with critical value - troponin 166. MD paged. Awaiting further instructions/orders. Patient resting comfortably in room. No complaints

## 2021-10-07 NOTE — ED Triage Notes (Signed)
Patient brought in by [facility] for failure to thrive.  Family visited patient and noted weight loss and noticed signs of dehydration.  Patient told EMS that he fell last night in the bathroom after feeling dizzy and passing out.  Has laceration on left forearm and upper shoulder.

## 2021-10-07 NOTE — H&P (Addendum)
History and Physical    POLO MCMARTIN VFI:[id number] DOB: 1923/10/05 DOA: 10/07/2021  PCP: Claretta Fraise, MD   Patient coming from: Home  I have personally briefly reviewed patient's old medical records in [place]  Chief Complaint: Weakness  HPI: TRENT GABLER is a 87 y.o. male with medical history significant for aortic stenosis, CABG, hypertension, renal prostate cancer, colostomy status Patient's was brought to the ED with complaints of dizziness, weight loss, dehydration and passing out.  About thanksgiving, symptoms started with diarrhea, generalized weakness, and then cough with difficulty breathing.  Saw his primary care provider about a week later, and was diagnosed with COVID.  He was given some prescriptions which included cough medications, but he tells me he declined any specific medications for treatment of COVID (molnupiravir- is listed on patient's medication list, he did not take it). Patients son  Eddie Dibbles is at bedside, and is from out of town.  From a respiratory standpoint, his cough and difficulty breathing has mostly resolved, but since the COVID symptoms started around Thanksgiving, he has been weak, not eating much.  Today while he was having a bowel movements, passed out, family found him, patient had fallen onto his left side.  He hit his head.    Reports dizziness, no chest pain, I am unable to confirm if he had palpitations.  He denies urinary symptoms.  No vomiting.  No loose stools.  Patient did not get vaccinated for COVID.  Family is also concerned about disposition back home as he may need assistance.  ED Course: Heart rates 62-70.  Temperature 97.6.  Respiratory 16 -22.  Blood pressure mostly 110s to 135.  O2 sats 91 to 95% on room air.  WBC 6.9.  Sodium 133 otherwise stable BMP.  Chest x-ray shows multifocal pneumonia.  EKG shows atrial fibrillation rate 61. Patient was started on ceftriaxone and azithromycin hospitalist admit for generalized  weakness, syncope and multifocal pneumonia.  Review of Systems: As per HPI all other systems reviewed and negative.  Past Medical History:  Diagnosis Date   Anemia    Aortic stenosis    a. mild-mod by TEE 11/2015.   Arthritis    left knee    CAD (coronary artery disease)    a. RCA thrombectomy 2014. b. CABGx3 in 11/2015 (EF 45-50% in 11/2015).   Chronic renal insufficiency, stage III (moderate) (HCC)    only has 0ne kidney   Dyspnea    WITH EXERTION   Esophageal stricture YRS AGO   Hypertension    Hypothyroidism    Kidney stone 1969   MI (myocardial infarction) ([place]) 2017   Mitral regurgitation    a. mild by TEE 11/2015.   Myocardial infarction ([place]) 03/12/13   Inferior MI.  LAD mid 80% stenosis, circ mild plaque, RCA 100% with thrombectomy PTCA.  EF 65%   Osteoporosis    PC (prostate cancer) ([place]) 1987   RBBB    Rectum cancer ([place]) 1986   Renal cell cancer ([place]) 2005    Past Surgical History:  Procedure Laterality Date   CARDIAC CATHETERIZATION N/A 12/06/2015   Procedure: Left Heart Cath and Coronary Angiography;  Surgeon: Burnell Blanks, MD;  Location: [facility];  Service: Cardiovascular;  Laterality: N/A;   COLONOSCOPY  01/08/2012   Procedure: COLONOSCOPY;  Surgeon: Rogene Houston, MD;  [facility];  Service: Endoscopy;  Laterality: N/A;  1030   COLONOSCOPY N/A 07/20/2015   Procedure: COLONOSCOPY;  Surgeon: Mechele Dawley  Laural Golden, MD;  [facility];  Service: Endoscopy;  Laterality: N/A;  930 - moved to 9/29 @ 2:00   COLOSTOMY Left 1986   colon cancer   CORONARY ARTERY BYPASS GRAFT N/A 12/08/2015   Procedure: CORONARY ARTERY BYPASS GRAFTING (CABG);  Surgeon: Melrose Nakayama, MD;  Location: [facility];  Service: Open Heart Surgery;  Laterality: N/A;  Times 3 using left internal mammary artery and endoscopycally harvested bilateral saphenous vein.   HERNIA REPAIR     2007 RIGHT INGUINAL HERNIA   KIDNEY SURGERY Right 2004   LEFT HEART  CATHETERIZATION WITH CORONARY ANGIOGRAM N/A 03/12/2013   Procedure: STEMI;  Surgeon: Burnell Blanks, MD;  Location: [facility];  Service: Cardiovascular;  Laterality: N/A;   PERCUTANEOUS CORONARY INTERVENTION-BALLOON ONLY     PROSTATECTOMY     RADIOFREQUENCY ABLATION KIDNEY  2006   REMOVAL OF PENILE PROSTHESIS N/A 12/15/2017   Procedure: REMOVAL OF PENILE PROSTHESIS;  Surgeon: Franchot Gallo, MD;  Location: [facility];  Service: Urology;  Laterality: N/A;   REMOVAL OF PENILE PROSTHESIS N/A 02/23/2018   Procedure: [name] OF ERODED SPHINCTER;  Surgeon: Franchot Gallo, MD;  Location: [facility];  Service: Urology;  Laterality: N/A;   renal sphincter appliance  2007   TEE WITHOUT CARDIOVERSION N/A 12/08/2015   Procedure: TRANSESOPHAGEAL ECHOCARDIOGRAM (TEE);  Surgeon: Melrose Nakayama, MD;  Location: [facility];  Service: Open Heart Surgery;  Laterality: N/A;     reports that he quit smoking about 52 years ago. His smoking use included cigarettes. He started smoking about 75 years ago. He has a 5.00 pack-year smoking history. He has never used smokeless tobacco. He reports current alcohol use of about 1.0 standard drink per week. He reports that he does not use drugs.  Allergies  Allergen Reactions   Lipitor [Atorvastatin] Other (See Comments)    Muscle aches    Family History  Problem Relation Age of Onset   Stroke Mother 59   AAA (abdominal aortic aneurysm) Father 40       died from rupture at 39 yo   Arthritis Father    Parkinson's disease Sister    Cancer Son        prostate cancer   Hypertension Son     Prior to Admission medications   Medication Sig Start Date End Date Taking? Authorizing Provider  benzonatate (TESSALON PERLES) 100 MG capsule Take 1 capsule (100 mg total) by mouth 3 (three) times daily as needed. 09/26/21  Yes Gottschalk, Ashly M, DO  carvedilol (COREG) 3.125 MG tablet TAKE 1 TABLET TWICE A DAY WITH MEALS (BREAKFAST AND SUPPER) 10/03/21  Yes Stacks, Cletus Gash,  MD  furosemide (LASIX) 20 MG tablet TAKE 1 TABLET BY MOUTH DAILY AS NEEDED. 07/09/21  Yes Stacks, Cletus Gash, MD  guaiFENesin-codeine (CHERATUSSIN AC) 100-10 MG/5ML syrup Take 5 mLs by mouth every 4 (four) hours as needed for cough. 10/03/21  Yes Stacks, Cletus Gash, MD  levothyroxine (SYNTHROID) 75 MCG tablet Take 1 tablet (75 mcg total) by mouth daily. 10/03/21  Yes Claretta Fraise, MD  lipase/protease/amylase (CREON) 36000 UNITS CPEP capsule Take 1 capsule by mouth at the first bite of each meal, and 1 capsule during meal, and 1 capsule at the last bite of a meal.  Allow for 3 meals per day.  If eating snacks take 1 capsule with snack. 11/02/19  Yes [provider]  meclizine (ANTIVERT) 25 MG tablet Take 25 mg by mouth 3 (three) times daily as needed for dizziness.  Yes [provider]  Multiple Vitamin (MULTIVITAMIN) capsule Take 1 capsule daily by mouth.   Yes [provider]  Multiple Vitamins-Minerals (ZINC PO) Take by mouth at bedtime.   Yes [provider]  nitroGLYCERIN (NITROSTAT) 0.4 MG SL tablet PLACE 1 TABLET UNDER THE TONGUE AT ONSET OF CHEST PAIN EVERY 5 MINTUES UP TO 3 TIMES AS NEEDED FOR CHEST PAIN 01/04/19  Yes Almyra Deforest, PA  molnupiravir EUA (LAGEVRIO) 200 mg CAPS capsule Take 4 capsules (800 mg total) by mouth 2 (two) times daily for 5 days. Patient not taking: Reported on 10/07/2021 10/03/21 10/08/21  Claretta Fraise, MD  Probiotic Product (VISBIOME PO) Take 2 capsules by mouth 2 (two) times daily. Patient not taking: Reported on 10/07/2021    [provider]    Physical Exam: Vitals:   10/07/21 1323 10/07/21 1557 10/07/21 1630 10/07/21 1700  BP: 135/86 110/69 90/73 117/64  Pulse: 68 62 70 68  Resp: 16 19 (!) 22 17  Temp: 97.6 F (36.4 C)     TempSrc: Oral     SpO2: 91% 94% 95% 95%  Weight:      Height:        Constitutional: NAD, calm, comfortable Vitals:   10/07/21 1323 10/07/21 1557 10/07/21 1630 10/07/21 1700  BP: 135/86 110/69  90/73 117/64  Pulse: 68 62 70 68  Resp: 16 19 (!) 22 17  Temp: 97.6 F (36.4 C)     TempSrc: Oral     SpO2: 91% 94% 95% 95%  Weight:      Height:       Eyes: PERRL, lids and conjunctivae normal ENMT: Mucous membranes are moist.  Neck: normal, supple, no masses, no thyromegaly Respiratory: clear to auscultation bilaterally, no wheezing, no crackles. Normal respiratory effort. No accessory muscle use.  Cardiovascular: Irregular rate and rhythm, loud 3/6 systolic murmur, right sternal border.  No extremity edema.  Lower extremities warm and well-perfused.   Abdomen: no tenderness, no masses palpated. No hepatosplenomegaly. Bowel sounds positive.  Colostomy status-left abdomen Musculoskeletal: no clubbing / cyanosis. No joint deformity upper and lower extremities. Good ROM, no contractures. Normal muscle tone.  Skin: no rashes, lesions, ulcers. No induration Neurologic: No apparent cranial abnormality, 4+/5 strength in all extremities.Marland Kitchen  Psychiatric: Normal judgment and insight.  Appears slightly weak, otherwise alert and oriented x 3. Normal mood.   Labs on Admission: I have personally reviewed following labs and imaging studies  CBC: Recent Labs  Lab 10/03/21 0952 10/07/21 1419  WBC 4.9 6.9  NEUTROABS 3.3 5.1  HGB 12.6* 11.6*  HCT 36.1* 36.5*  MCV 79 87.7  PLT 195 557   Basic Metabolic Panel: Recent Labs  Lab 10/03/21 0952 10/07/21 1419  NA 133* 133*  K 4.6 4.3  CL 98 102  CO2 20 24  GLUCOSE 95 92  BUN 28 35*  CREATININE 1.46* 1.28*  CALCIUM 9.6 8.9   GFR: Estimated Creatinine Clearance: 28.3 mL/min (A) (by C-G formula based on SCr of 1.28 mg/dL (H)). Liver Function Tests: Recent Labs  Lab 10/03/21 0952 10/07/21 1419  AST 30 22  ALT 14 13  ALKPHOS 84 63  BILITOT 0.8 0.8  PROT 6.2 5.4*  ALBUMIN 3.4* 2.7*    Urine analysis:    Component Value Date/Time   COLORURINE YELLOW 10/07/2021 [place] 10/07/2021 1757   APPEARANCEUR Clear  04/09/2019 1115   LABSPEC 1.017 10/07/2021 1757   PHURINE 5.0 10/07/2021 [place] 10/07/2021 1757  HGBUR NEGATIVE 10/07/2021 1757   [place] 10/07/2021 1757   BILIRUBINUR Negative 04/09/2019 1115   KETONESUR NEGATIVE 10/07/2021 1757   PROTEINUR 30 (A) 10/07/2021 1757   UROBILINOGEN 0.2 03/16/2013 0443   NITRITE NEGATIVE 10/07/2021 1757   LEUKOCYTESUR NEGATIVE 10/07/2021 1757    Radiological Exams on Admission: CT Head Wo Contrast  Result Date: 10/07/2021 CLINICAL DATA:  Head trauma, fall in the bathroom last night where EXAM: CT HEAD WITHOUT CONTRAST TECHNIQUE: Contiguous axial images were obtained from the base of the skull through the vertex without intravenous contrast. COMPARISON:  None. FINDINGS: Brain: No evidence of acute infarction, hemorrhage, hydrocephalus, extra-axial collection or mass lesion/mass effect. Prominence of the ventricles and sulci secondary to moderate cerebral volume loss. Diffuse low-attenuation of the periventricular and subcortical white matter presumed advanced chronic microvascular ischemic changes. Vascular: Atherosclerotic calcification of bilateral vertebral arteries and carotid siphons. Skull: Normal. Negative for fracture or focal lesion. Sinuses/Orbits: Mucous retention cyst in the left maxillary sinus. Bilateral cataract surgery. Other: None. IMPRESSION: 1.  No acute intracranial abnormality. 2. Advanced cerebral volume loss and chronic microvascular ischemic changes of the white matter. 3.  Mild paranasal sinus disease. Electronically Signed   By: Keane Police D.O.   On: 10/07/2021 15:22   DG Chest Port 1 View  Result Date: 10/07/2021 CLINICAL DATA:  Family visited patient and noted weight loss and noticed signs of dehydration. Patient told EMS that he fell last night in the bathroom after feeling dizzy and passing out. Pt c/o cough. Hx of HTN EXAM: PORTABLE CHEST 1 VIEW COMPARISON:  01/14/2018 and older studies. FINDINGS:  Previous CABG surgery. Cardiac silhouette borderline enlarged. No mediastinal or hilar masses. Previously noted hiatal hernia is not defined, but likely unchanged. There are bilateral interstitial and patchy airspace opacities that are new. No visualized pleural effusion.  No pneumothorax. Skeletal structures are grossly intact. IMPRESSION: 1. Patchy bilateral interstitial and airspace lung opacities new compared to the prior exam. Findings consistent with multifocal pneumonia with atypical etiologies, including viral pneumonia, in the differential diagnosis. Electronically Signed   By: Lajean Manes [name]   On: 10/07/2021 14:49    EKG: Independently reviewed.  Atrial fibrillation rate 61.  Low voltage.  No significant ST or T wave changes.  Last EKG from 2021 shows sinus bradycardia.  Assessment/Plan Principal Problem:   New onset atrial fibrillation (HCC) Active Problems:   COVID-19 virus infection   Essential hypertension, benign   Colostomy in place [facility])   Prostate cancer ([place])   History of renal cell cancer-s/p nephrectomy   S/P CABG x 3   Aortic stenosis  New onset atrial fibrillation- in the setting resolving COVID infection.  Heart rate 62-70.  Blood pressure stable. CHAD2Vasc score- 4- Age, HTN hx, and vascular hx- CAD.  No GI bleed history.  Fell today, but no history of frequent falls. -Troponins, magnesium, TSH -Obtain echocardiogram -Talked to patient's son Eddie Dibbles at bedside, at this time undecided about decision to start anticoagulation.  Will want joint conversation with patient's spouse also. -Anticoagulation deferred for now -Resume home carvedilol 3.125 mg twice daily  COVID-19 infection -appears to be resolving from a respiratory standpoint,but with persistent generalized weakness and poor oral intake.  Cough and difficulty breathing have mostly resolved.  Symptoms started ~2 weeks ago, around Thanksgiving.  Chest xray showing -multifocal pneumonia.   O2 sats 91 to 95% on room  air.  Afebrile without leukocytosis.  Did not get any of the COVID vaccines. -Obtain and trend  inflammatory markers -COVID test not repeated -Continue Precautions for now -Pro-Calcitonin -IV ceftriaxone and azithromycin started, hold off on further antibiotics for now -Out of window for any treatments for COVID, as his respiratory symptoms have almost resolved, will hold off on steroids at this time. -Physical therapy evaluation  Syncope- in the setting of dehydration, poor oral intake, from resolving COVID infection.  And new onset atrial fibrillation.  Also has significant murmur, history of aortic stenosis.  Lactic acid 1.2 > 2.1.  Doubt infectious etiology at this time. Head ct no acute abnormality. - check ck -1 L bolus given, continue N/s 75cc/hr x 1 day -Echo -Troponins -Trend lactic acid.  Hypertension - stable -Serum carvedilol 3.125 mg twice daily  Prostate cancer, renal cancer status post nephrectomy  CABG x3-stable.  Denies chest pain.  EKG without significant ST or T wave changes but shows new onset A. fib.  Aortic stenosis  -Follow-up echo  Colostomy status  Pancreatic insufficiency -Resume Creon   DVT prophylaxis: heparin Code Status: Full code, confirmed with patient and son at bedside. Family Communication: Patient's son Eddie Dibbles at bedside.  Spouse currently not present. Disposition Plan: ~ 1 - 2 days Consults called: None Admission status: Obs tele     Bethena Roys MD [facility]  10/07/2021, 8:09 PM

## 2021-10-08 ENCOUNTER — Observation Stay (HOSPITAL_BASED_OUTPATIENT_CLINIC_OR_DEPARTMENT_OTHER): Payer: Medicare Other

## 2021-10-08 ENCOUNTER — Encounter (HOSPITAL_COMMUNITY): Payer: Self-pay | Admitting: Internal Medicine

## 2021-10-08 ENCOUNTER — Other Ambulatory Visit (HOSPITAL_COMMUNITY): Payer: Self-pay

## 2021-10-08 DIAGNOSIS — U071 COVID-19: Secondary | ICD-10-CM

## 2021-10-08 DIAGNOSIS — I4891 Unspecified atrial fibrillation: Secondary | ICD-10-CM | POA: Diagnosis not present

## 2021-10-08 DIAGNOSIS — I350 Nonrheumatic aortic (valve) stenosis: Secondary | ICD-10-CM

## 2021-10-08 LAB — CBC WITH DIFFERENTIAL/PLATELET
Abs Immature Granulocytes: 0.06 10*3/uL (ref 0.00–0.07)
Basophils Absolute: 0 10*3/uL (ref 0.0–0.1)
Basophils Relative: 0 %
Eosinophils Absolute: 0.2 10*3/uL (ref 0.0–0.5)
Eosinophils Relative: 2 %
HCT: 32.2 % — ABNORMAL LOW (ref 39.0–52.0)
Hemoglobin: 10.2 g/dL — ABNORMAL LOW (ref 13.0–17.0)
Immature Granulocytes: 1 %
Lymphocytes Relative: 14 %
Lymphs Abs: 1.1 10*3/uL (ref 0.7–4.0)
MCH: 27.8 pg (ref 26.0–34.0)
MCHC: 31.7 g/dL (ref 30.0–36.0)
MCV: 87.7 fL (ref 80.0–100.0)
Monocytes Absolute: 0.7 10*3/uL (ref 0.1–1.0)
Monocytes Relative: 9 %
Neutro Abs: 6.1 10*3/uL (ref 1.7–7.7)
Neutrophils Relative %: 74 %
Platelets: 255 10*3/uL (ref 150–400)
RBC: 3.67 MIL/uL — ABNORMAL LOW (ref 4.22–5.81)
RDW: 14.3 % (ref 11.5–15.5)
WBC: 8.1 10*3/uL (ref 4.0–10.5)
nRBC: 0 % (ref 0.0–0.2)

## 2021-10-08 LAB — COMPREHENSIVE METABOLIC PANEL
ALT: 13 U/L (ref 0–44)
AST: 20 U/L (ref 15–41)
Albumin: 2.4 g/dL — ABNORMAL LOW (ref 3.5–5.0)
Alkaline Phosphatase: 57 U/L (ref 38–126)
Anion gap: 7 (ref 5–15)
BUN: 32 mg/dL — ABNORMAL HIGH (ref 8–23)
CO2: 23 mmol/L (ref 22–32)
Calcium: 8.7 mg/dL — ABNORMAL LOW (ref 8.9–10.3)
Chloride: 107 mmol/L (ref 98–111)
Creatinine, Ser: 1.21 mg/dL (ref 0.61–1.24)
GFR, Estimated: 55 mL/min — ABNORMAL LOW (ref >60)
Glucose, Bld: 96 mg/dL (ref 70–99)
Potassium: 4.1 mmol/L (ref 3.5–5.1)
Sodium: 137 mmol/L (ref 135–145)
Total Bilirubin: 0.4 mg/dL (ref 0.3–1.2)
Total Protein: 5 g/dL — ABNORMAL LOW (ref 6.5–8.1)

## 2021-10-08 LAB — ECHOCARDIOGRAM COMPLETE
AR max vel: 0.96 cm2
AV Area VTI: 0.92 cm2
AV Area mean vel: 0.81 cm2
AV Mean grad: 17.3 mmHg
AV Peak grad: 30.5 mmHg
Ao pk vel: 2.76 m/s
Area-P 1/2: 4.46 cm2
Height: 64 in
MV VTI: 2.01 cm2
S' Lateral: 2.6 cm
Weight: 2261.04 oz

## 2021-10-08 LAB — D-DIMER, QUANTITATIVE: D-Dimer, Quant: 2.01 ug/mL-FEU — ABNORMAL HIGH (ref 0.00–0.50)

## 2021-10-08 LAB — FERRITIN: Ferritin: 191 ng/mL (ref 24–336)

## 2021-10-08 LAB — C-REACTIVE PROTEIN
CRP: <0.5 mg/dL (ref <1.0)
CRP: <0.5 mg/dL (ref <1.0)

## 2021-10-08 MED ORDER — CHLORHEXIDINE GLUCONATE CLOTH 2 % EX PADS
6.0000 | MEDICATED_PAD | Freq: Every day | CUTANEOUS | Status: DC
  Administered 2021-10-08: 15:00:00 6 via TOPICAL

## 2021-10-08 MED ORDER — AMOXICILLIN-POT CLAVULANATE 875-125 MG PO TABS: 1.0000 | ORAL_TABLET | Freq: Two times a day (BID) | ORAL | 0 refills | Status: AC

## 2021-10-08 MED ORDER — APIXABAN 2.5 MG PO TABS
2.5000 mg | ORAL_TABLET | Freq: Two times a day (BID) | ORAL | Status: DC
  Administered 2021-10-08: 15:00:00 2.5 mg via ORAL
  Filled 2021-10-08: qty 1

## 2021-10-08 MED ORDER — APIXABAN 2.5 MG PO TABS: 2.5000 mg | ORAL_TABLET | Freq: Two times a day (BID) | ORAL | 3 refills | Status: DC

## 2021-10-08 NOTE — Consult Note (Signed)
Cardiology Consultation:   Patient ID: BRIGHTON PILLEY MRN: [id number]; DOB: 06/10/1923  Admit date: 10/07/2021 Date of Consult: 10/08/2021  PCP:  Claretta Fraise, MD   [facility] Providers Cardiologist:  Minus Breeding, MD   {    Patient Profile:   Frederick Davis is a 87 y.o. male with a hx of coronary artery disease (inferior MI in 2014 treated with RCA thrombectomy and subsequent CABG in 2/17), moderate aortic stenosis (echo 05/2019: Mean gradient 23), hypertension, hyperlipidemia, chronic kidney disease, renal cancer status post right nephrectomy, prostate cancer status post prostatectomy, rectal cancer status post colostomy, hypothyroidism who is being seen 10/08/2021 for the evaluation of atrial fibrillation, elevated troponin at the request of Dr. Denton Brick.  History of Present Illness:   [name] is followed by Dr. Percival Spanish in our [place] office.  He was last seen in 2/22.  He was diagnosed with COVID around Thanksgiving.  He has not been vaccinated.  He did not take any antiviral therapy.  He has been weak and dizzy without much appetite since.  He presented to the emergency room with syncope.  Chest x-ray demonstrated multifocal pneumonia.  EKG demonstrated new onset atrial fibrillation with controlled rate.  His troponins were minimally elevated without clear trend (133>> 137).  Cardiology is asked to further evaluate.  Data Na 137, K 4.1, BUN 32, Cr 1.21, Alb 2.4, ALT 13, Hgb 10.2 hsTrop 166>>150 Bld Cx x 2 NG x 24 hours Head CT: no acute findings CXR: multifocal PNA EKG #1: Afib, HR 54, Right Bundle Branch Block EKG #2: AFib HR 61, Right Bundle Branch Block   He has dementia and speaks slowly with forgetfulness He is unaware of his afib. Indicates he's had a murmur before Lives with wife but doesn't leave house much Denies falling    Past Medical History:  Diagnosis Date   Anemia    Aortic stenosis    Moderate // Echo 05/2019: EF 60-65, asymmetric septal LVH,  mildly reduced RVSF, RVSP 37.4, mod RAE, severe LAE, mod TR, mod AS (V-max 280 cm/s, mean gradient 23 mmHg, DI 0.35), aortic root 38   Arthritis    left knee    CAD (coronary artery disease)    a. RCA thrombectomy 2014. b. CABGx3 in 11/2015 (EF 45-50% in 11/2015).   Chronic renal insufficiency, stage III (moderate) (HCC)    only has 0ne kidney   Dyspnea    WITH EXERTION   Esophageal stricture YRS AGO   Hypertension    Hypothyroidism    Kidney stone 1969   MI (myocardial infarction) ([place]) 2017   Mitral regurgitation    a. mild by TEE 11/2015.   Myocardial infarction ([place]) 03/12/2013   Inferior MI.  LAD mid 80% stenosis, circ mild plaque, RCA 100% with thrombectomy PTCA.  EF 65%   Osteoporosis    PC (prostate cancer) ([place]) 1987   RBBB    Rectum cancer ([place]) 1986   Renal cell cancer ([place]) 2005    Past Surgical History:  Procedure Laterality Date   CARDIAC CATHETERIZATION N/A 12/06/2015   Procedure: Left Heart Cath and Coronary Angiography;  Surgeon: Burnell Blanks, MD;  Location: [facility];  Service: Cardiovascular;  Laterality: N/A;   COLONOSCOPY  01/08/2012   Procedure: COLONOSCOPY;  Surgeon: Rogene Houston, MD;  [facility];  Service: Endoscopy;  Laterality: N/A;  1030   COLONOSCOPY N/A 07/20/2015   Procedure: COLONOSCOPY;  Surgeon: Rogene Houston, MD;  [facility];  Service: Endoscopy;  Laterality: N/A;  930 - moved to 9/29 @ 2:00   COLOSTOMY Left 1986   colon cancer   CORONARY ARTERY BYPASS GRAFT N/A 12/08/2015   Procedure: CORONARY ARTERY BYPASS GRAFTING (CABG);  Surgeon: Melrose Nakayama, MD;  Location: [facility];  Service: Open Heart Surgery;  Laterality: N/A;  Times 3 using left internal mammary artery and endoscopycally harvested bilateral saphenous vein.   HERNIA REPAIR     2007 RIGHT INGUINAL HERNIA   KIDNEY SURGERY Right 2004   LEFT HEART CATHETERIZATION WITH CORONARY ANGIOGRAM N/A 03/12/2013   Procedure: STEMI;  Surgeon:  Burnell Blanks, MD;  Location: [facility] CATH LAB;  Service: Cardiovascular;  Laterality: N/A;   PERCUTANEOUS CORONARY INTERVENTION-[place] ONLY     PROSTATECTOMY     RADIOFREQUENCY ABLATION KIDNEY  2006   REMOVAL OF PENILE PROSTHESIS N/A 12/15/2017   Procedure: REMOVAL OF PENILE PROSTHESIS;  Surgeon: Franchot Gallo, MD;  Location: [facility];  Service: Urology;  Laterality: N/A;   REMOVAL OF PENILE PROSTHESIS N/A 02/23/2018   Procedure: [name] OF ERODED SPHINCTER;  Surgeon: Franchot Gallo, MD;  Location: [facility];  Service: Urology;  Laterality: N/A;   renal sphincter appliance  2007   TEE WITHOUT CARDIOVERSION N/A 12/08/2015   Procedure: TRANSESOPHAGEAL ECHOCARDIOGRAM (TEE);  Surgeon: Melrose Nakayama, MD;  Location: [facility];  Service: Open Heart Surgery;  Laterality: N/A;     Home Medications:  Prior to Admission medications   Medication Sig Start Date End Date Taking? Authorizing Provider  benzonatate (TESSALON PERLES) 100 MG capsule Take 1 capsule (100 mg total) by mouth 3 (three) times daily as needed. 09/26/21  Yes Gottschalk, Ashly M, DO  carvedilol (COREG) 3.125 MG tablet TAKE 1 TABLET TWICE A DAY WITH MEALS (BREAKFAST AND SUPPER) 10/03/21  Yes Stacks, Cletus Gash, MD  furosemide (LASIX) 20 MG tablet TAKE 1 TABLET BY MOUTH DAILY AS NEEDED. 07/09/21  Yes Stacks, Cletus Gash, MD  guaiFENesin-codeine (CHERATUSSIN AC) 100-10 MG/5ML syrup Take 5 mLs by mouth every 4 (four) hours as needed for cough. 10/03/21  Yes Stacks, Cletus Gash, MD  levothyroxine (SYNTHROID) 75 MCG tablet Take 1 tablet (75 mcg total) by mouth daily. 10/03/21  Yes Claretta Fraise, MD  lipase/protease/amylase (CREON) 36000 UNITS CPEP capsule Take 1 capsule by mouth at the first bite of each meal, and 1 capsule during meal, and 1 capsule at the last bite of a meal.  Allow for 3 meals per day.  If eating snacks take 1 capsule with snack. 11/02/19  Yes [provider]  meclizine (ANTIVERT) 25 MG tablet Take 25 mg by mouth 3 (three)  times daily as needed for dizziness.   Yes [provider]  Multiple Vitamin (MULTIVITAMIN) capsule Take 1 capsule daily by mouth.   Yes [provider]  Multiple Vitamins-Minerals (ZINC PO) Take by mouth at bedtime.   Yes [provider]  nitroGLYCERIN (NITROSTAT) 0.4 MG SL tablet PLACE 1 TABLET UNDER THE TONGUE AT ONSET OF CHEST PAIN EVERY 5 MINTUES UP TO 3 TIMES AS NEEDED FOR CHEST PAIN 01/04/19  Yes Almyra Deforest, PA  molnupiravir EUA (LAGEVRIO) 200 mg CAPS capsule Take 4 capsules (800 mg total) by mouth 2 (two) times daily for 5 days. Patient not taking: Reported on 10/07/2021 10/03/21 10/08/21  Claretta Fraise, MD  Probiotic Product (VISBIOME PO) Take 2 capsules by mouth 2 (two) times daily. Patient not taking: Reported on 10/07/2021    [provider]    Inpatient Medications: Scheduled Meds:  carvedilol  3.125 mg Oral BID WC   heparin  5,000 Units Subcutaneous Q8H   levothyroxine  75 mcg Oral Daily   lipase/protease/amylase  108,000 Units Oral TID AC   Continuous Infusions:  sodium chloride 75 mL/hr at 10/07/21 2222   PRN Meds: ondansetron **OR** ondansetron (ZOFRAN) IV, polyethylene glycol  Allergies:    Allergies  Allergen Reactions   Lipitor [Atorvastatin] Other (See Comments)    Muscle aches    Social History:   Social History   Socioeconomic History   Marital status: Married    Spouse name: Mikle Bosworth   Number of children: 4   Years of education: 18   Highest education level: Professional school degree (e.g., MD, DDS, DVM, JD)  Occupational History   Occupation: pharmacist-retired   Occupation: preacher-retired  Tobacco Use   Smoking status: Former    Packs/day: 0.50    Years: 10.00    Pack years: 5.00    Types: Cigarettes    Start date: 10/21/1946    Quit date: 10/21/1968    Years since quitting: 53.0   Smokeless tobacco: Never  Vaping Use   Vaping Use: Never used  Substance and Sexual Activity   Alcohol use: Yes     Alcohol/week: 1.0 standard drink    Types: 1 Standard drinks or equivalent per week    Comment: RARE   Drug use: No   Sexual activity: Not Currently  Other Topics Concern   Not on file  Social History Narrative   Lives home with wife. One child in [place], others live in [place] Resource Strain: Low Risk    Difficulty of Paying Living Expenses: Not hard at all  Food Insecurity: No Food Insecurity   Worried About Charity fundraiser in the Last Year: Never true   Arboriculturist in the Last Year: Never true  Transportation Needs: No Transportation Needs   Lack of Transportation (Medical): No   Lack of Transportation (Non-Medical): No  Physical Activity: Sufficiently Active   Days of Exercise per Week: 3 days   Minutes of Exercise per Session: 50 min  Stress: No Stress Concern Present   Feeling of Stress : Only a little  Social Connections: Engineer, building services of Communication with Friends and Family: More than three times a week   Frequency of Social Gatherings with Friends and Family: More than three times a week   Attends Religious Services: More than 4 times per year   Active Member of [employer] or Organizations: Yes   Attends Music therapist: More than 4 times per year   Marital Status: Married  Human resources officer Violence: Not At Risk   Fear of Current or Ex-Partner: No   Emotionally Abused: No   Physically Abused: No   Sexually Abused: No    Family History:    Family History  Problem Relation Age of Onset   Stroke Mother 63   AAA (abdominal aortic aneurysm) Father 1       died from rupture at 70 yo   Arthritis Father    Parkinson's disease Sister    Cancer Son        prostate cancer   Hypertension Son      ROS:  Please see the history of present illness.   All other ROS reviewed and negative.     Physical Exam/Data:   Vitals:   10/07/21 1700 10/07/21 2043 10/08/21 0234 10/08/21  0535  BP: 117/64 102/71 127/77 (!) 131/91  Pulse: 68 (!) 55 (!) 57 (!) 51  Resp: 17 18 18 18   Temp:  98 F (36.7 C) 97.8 F (36.6 C) 97.7 F (36.5 C)  TempSrc:  Oral    SpO2: 95% 97% 97% 99%  Weight:  64.1 kg    Height:        Intake/Output Summary (Last 24 hours) at 10/08/2021 0908 Last data filed at 10/08/2021 0300 Gross per 24 hour  Intake 1184.73 ml  Output --  Net 1184.73 ml   Last 3 Weights 10/07/2021 10/07/2021 10/03/2021  Weight (lbs) 141 lb 5 oz 144 lb 140 lb 9.6 oz  Weight (kg) 64.1 kg 65.318 kg 63.776 kg     Body mass index is 24.26 kg/m.  Frail elderly male with Dementia HEENT: normal Neck supple with no adenopathy JVP normal no bruits no thyromegaly Lungs diffuse rhonchi  Heart:  S1/S2 diminished with AS  murmur, no rub, gallop or click PMI normal Abdomen: benighn, BS positve, no tenderness, no AAA no bruit.  No HSM or HJR Distal pulses intact with no bruits No edema Neuro non-focal Skin warm and dry No muscular weakness   EKG:  The EKG was personally reviewed and demonstrates:  afib with RBBB low voltage   Telemetry:  Telemetry was personally reviewed and demonstrates:  afib rates ok   Relevant CV Studies: Echocardiogram pending   Laboratory Data:  High Sensitivity Troponin:   Recent Labs  Lab 10/07/21 1748 10/07/21 2213  TROPONINIHS 166* 150*     Chemistry Recent Labs  Lab 10/03/21 0952 10/07/21 1419 10/07/21 1748 10/08/21 0414  NA 133* 133*  --  137  K 4.6 4.3  --  4.1  CL 98 102  --  107  CO2 20 24  --  23  GLUCOSE 95 92  --  96  BUN 28 35*  --  32*  CREATININE 1.46* 1.28*  --  1.21  CALCIUM 9.6 8.9  --  8.7*  MG  --   --  2.0  --   GFRNONAA  --  51*  --  55*  ANIONGAP  --  7  --  7    Recent Labs  Lab 10/03/21 0952 10/07/21 1419 10/08/21 0414  PROT 6.2 5.4* 5.0*  ALBUMIN 3.4* 2.7* 2.4*  AST 30 22 20   ALT 14 13 13   ALKPHOS 84 63 57  BILITOT 0.8 0.8 0.4   Lipids  Recent Labs  Lab 10/03/21 0952  CHOL 94*   TRIG 66  HDL 45  LABVLDL 15  LDLCALC 34  CHOLHDL 2.1    Hematology Recent Labs  Lab 10/03/21 0952 10/07/21 1419 10/08/21 0414  WBC 4.9 6.9 8.1  RBC 4.55 4.16* 3.67*  HGB 12.6* 11.6* 10.2*  HCT 36.1* 36.5* 32.2*  MCV 79 87.7 87.7  MCH 27.7 27.9 27.8  MCHC 34.9 31.8 31.7  RDW 12.7 14.5 14.3  PLT 195 248 255   Thyroid  Recent Labs  Lab 10/03/21 0952 10/07/21 1748  TSH 1.360 0.915  FREET4 1.58  --     BNPNo results for input(s): BNP, PROBNP in the last 168 hours.  DDimer  Recent Labs  Lab 10/07/21 1529 10/08/21 0414  DDIMER 1.97* 2.01*     Radiology/Studies:  CT Head Wo Contrast  Result Date: 10/07/2021 CLINICAL DATA:  Head trauma, fall in the bathroom last night where EXAM: CT HEAD WITHOUT CONTRAST TECHNIQUE: Contiguous axial images were obtained from the base  of the skull through the vertex without intravenous contrast. COMPARISON:  None. FINDINGS: Brain: No evidence of acute infarction, hemorrhage, hydrocephalus, extra-axial collection or mass lesion/mass effect. Prominence of the ventricles and sulci secondary to moderate cerebral volume loss. Diffuse low-attenuation of the periventricular and subcortical white matter presumed advanced chronic microvascular ischemic changes. Vascular: Atherosclerotic calcification of bilateral vertebral arteries and carotid siphons. Skull: Normal. Negative for fracture or focal lesion. Sinuses/Orbits: Mucous retention cyst in the left maxillary sinus. Bilateral cataract surgery. Other: None. IMPRESSION: 1.  No acute intracranial abnormality. 2. Advanced cerebral volume loss and chronic microvascular ischemic changes of the white matter. 3.  Mild paranasal sinus disease. Electronically Signed   By: Keane Police D.O.   On: 10/07/2021 15:22   DG Chest Port 1 View  Result Date: 10/07/2021 CLINICAL DATA:  Family visited patient and noted weight loss and noticed signs of dehydration. Patient told EMS that he fell last night in the  bathroom after feeling dizzy and passing out. Pt c/o cough. Hx of HTN EXAM: PORTABLE CHEST 1 VIEW COMPARISON:  01/14/2018 and older studies. FINDINGS: Previous CABG surgery. Cardiac silhouette borderline enlarged. No mediastinal or hilar masses. Previously noted hiatal hernia is not defined, but likely unchanged. There are bilateral interstitial and patchy airspace opacities that are new. No visualized pleural effusion.  No pneumothorax. Skeletal structures are grossly intact. IMPRESSION: 1. Patchy bilateral interstitial and airspace lung opacities new compared to the prior exam. Findings consistent with multifocal pneumonia with atypical etiologies, including viral pneumonia, in the differential diagnosis. Electronically Signed   By: Lajean Manes [name]   On: 10/07/2021 14:49     Assessment and Plan:   1. Elevated Troponin This is likely demand ischemia due to multifocal pneumonia in the setting of baseline chronic kidney disease, moderate aortic stenosis and coronary artery disease.  Echocardiogram is currently pending.no indication for ischemic evaluation given age and comorbidity   2.  Aortic stenosis Severity of aortic stenosis has likely worsened since last echocardiogram in 2020.  Echocardiogram this admission pending.  Given his advanced age and multiple comorbid illnesses, he is not likely a candidate for further intervention.Also not a TAVR candidate   3.  Atrial fibrillation CHA2DS2-VASc Score = 4 [CHF History: 0, HTN History: 1, Diabetes History: 0, Stroke History: 0, Vascular Disease History: 1, Age Score: 2, Gender Score: 0].  Therefore, the patient's annual risk of stroke is 4.8 %.  Anticoagulation is recommended given his thromboembolic risk factors.  However, he has had some weakness and falls.  We will need to continue to assess his status to determine +/- treatment with anticoagulation.suggested to primary no anticoagulation vs eliquis 2.5 mg bid   4.  Coronary artery disease No  chest pain see above no ischemic w/u   5.  Multifocal pneumonia Per primary service on Rocephin   6.  Hypertension Continue carvedilol.     For questions or updates, please contact [place] Please consult [email or web address] for contact info under    Jenkins Rouge MD [facility]

## 2021-10-08 NOTE — Plan of Care (Signed)
°  Problem: Acute Rehab PT Goals(only PT should resolve) Goal: Pt Will Go Supine/Side To Sit Outcome: Progressing Flowsheets (Taken 10/08/2021 1214) Pt will go Supine/Side to Sit: with supervision Goal: Patient Will Transfer Sit To/From Stand Outcome: Progressing Flowsheets (Taken 10/08/2021 1214) Patient will transfer sit to/from stand: with supervision Goal: Pt Will Transfer Bed To Chair/Chair To Bed Outcome: Progressing Flowsheets (Taken 10/08/2021 1214) Pt will Transfer Bed to Chair/Chair to Bed: with supervision Goal: Pt Will Ambulate Outcome: Progressing Flowsheets (Taken 10/08/2021 1214) Pt will Ambulate:  50 feet  with cane  with rolling walker  with supervision  with modified independence   12:14 PM, 10/08/21 Lonell Grandchild, MPT Physical Therapist with [facility] [phone] office [phone] mobile phone

## 2021-10-08 NOTE — Discharge Summary (Signed)
Physician Discharge Summary  Frederick Davis QMV:[id number] DOB: [birth year] DOA: 10/07/2021  PCP: Claretta Fraise, MD  Admit date: 10/07/2021  Discharge date: 10/08/2021  Admitted From:Home  Disposition:  Home  Recommendations for Outpatient Follow-up:  Follow up with PCP in 1-2 weeks Remain on Eliquis 2.5 mg twice daily as prescribed Follow-up with cardiology Dr. Percival Spanish as recommended Continue on Augmentin for 5 more days for pneumonia as prescribed Continue on medications as noted below  Home Health: Yes with PT, RN  Equipment/Devices: Bedside,  Discharge Condition:Stable  CODE STATUS: Full  Diet recommendation: Heart Healthy  Brief/Interim Summary: Per HPI: Frederick Davis is a 87 y.o. male with medical history significant for aortic stenosis, CABG, hypertension, renal prostate cancer, colostomy status Patient's was brought to the ED with complaints of dizziness, weight loss, dehydration and passing out.  About thanksgiving, symptoms started with diarrhea, generalized weakness, and then cough with difficulty breathing.  Saw his primary care provider about a week later, and was diagnosed with COVID.  He was given some prescriptions which included cough medications, but he tells me he declined any specific medications for treatment of COVID (molnupiravir- is listed on patient's medication list, he did not take it). Patients son  Eddie Dibbles is at bedside, and is from out of town.  From a respiratory standpoint, his cough and difficulty breathing has mostly resolved, but since the COVID symptoms started around Thanksgiving, he has been weak, not eating much.  Today while he was having a bowel movements, passed out, family found him, patient had fallen onto his left side.  He hit his head.    Patient was admitted with syncopal episode related to aortic valve stenosis and new onset atrial fibrillation.  He has remained rate controlled with his home carvedilol dose, but given his high  CHA2DS2-VASc score of 4 he was recommended to start on Eliquis per cardiology.  Family members and patient are in agreement for doing this and he has been seen by PT with recommendations for home health physical therapy.  Additionally he has had 2D echocardiogram as noted below with LVEF 55% and no wall motion abnormalities noted.  He does have a severely calcified aortic valve, but his advanced age prohibits any further treatment of this valve.  He was noted to have recent COVID infection as stated above, and his chest x-ray showed signs of multifocal pneumonia.  He was started on Rocephin and azithromycin and will be discharged on Augmentin for 5 more days to complete course of treatment for potential pneumonia.  He has had no other acute events noted throughout the course of this admission and is otherwise stable for discharge.  Discharge Diagnoses:  Principal Problem:   New onset atrial fibrillation (HCC) Active Problems:   Essential hypertension, benign   Colostomy in place [facility])   Prostate cancer ([place])   History of renal cell cancer-s/p nephrectomy   S/P CABG x 3   Aortic stenosis   COVID-19 virus infection  Principal discharge diagnosis: Syncopal episode in the setting of aortic valve stenosis moderate to severe along with new onset atrial fibrillation.  Development of multifocal pneumonia in the setting of recent COVID-19 infection.  Discharge Instructions  Discharge Instructions     Diet - low sodium heart healthy   Complete by: As directed    Increase activity slowly   Complete by: As directed       Allergies as of 10/08/2021       Reactions   Lipitor [atorvastatin] Other (See  Comments)   Muscle aches        Medication List     STOP taking these medications    molnupiravir EUA 200 mg Caps capsule Commonly known as: LAGEVRIO   VISBIOME PO       TAKE these medications    amoxicillin-clavulanate 875-125 MG tablet Commonly known as: Augmentin Take 1 tablet  by mouth 2 (two) times daily for 5 days.   apixaban 2.5 MG Tabs tablet Commonly known as: ELIQUIS Take 1 tablet (2.5 mg total) by mouth 2 (two) times daily.   benzonatate 100 MG capsule Commonly known as: Tessalon Perles Take 1 capsule (100 mg total) by mouth 3 (three) times daily as needed.   carvedilol 3.125 MG tablet Commonly known as: COREG TAKE 1 TABLET TWICE A DAY WITH MEALS (BREAKFAST AND SUPPER)   Cheratussin AC 100-10 MG/5ML syrup Generic drug: guaiFENesin-codeine Take 5 mLs by mouth every 4 (four) hours as needed for cough.   furosemide 20 MG tablet Commonly known as: LASIX TAKE 1 TABLET BY MOUTH DAILY AS NEEDED.   levothyroxine 75 MCG tablet Commonly known as: SYNTHROID Take 1 tablet (75 mcg total) by mouth daily.   lipase/protease/amylase 36000 UNITS Cpep capsule Commonly known as: CREON Take 1 capsule by mouth at the first bite of each meal, and 1 capsule during meal, and 1 capsule at the last bite of a meal.  Allow for 3 meals per day.  If eating snacks take 1 capsule with snack.   meclizine 25 MG tablet Commonly known as: ANTIVERT Take 25 mg by mouth 3 (three) times daily as needed for dizziness.   multivitamin capsule Take 1 capsule daily by mouth.   nitroGLYCERIN 0.4 MG SL tablet Commonly known as: NITROSTAT PLACE 1 TABLET UNDER THE TONGUE AT ONSET OF CHEST PAIN EVERY 5 MINTUES UP TO 3 TIMES AS NEEDED FOR CHEST PAIN   ZINC PO Take by mouth at bedtime.               Durable Medical Equipment  (From admission, onward)           Start     Ordered   10/08/21 1011  For home use only DME Bedside commode  Once       Question:  Patient needs a bedside commode to treat with the following condition  Answer:  Weakness   10/08/21 1010            Follow-up Information     [facility] Follow up.   Specialty: Home Health Services Why: Will contact you to schedule home health visits. Contact information: [place] [address] [phone]         Claretta Fraise, MD. Schedule an appointment as soon as possible for a visit in 1 week(s).   Specialty: Family Medicine Contact information: [place] [address] [phone]         Minus Breeding, MD. Schedule an appointment as soon as possible for a visit.   Specialty: Cardiology Contact information: [address] [place] [address] [phone]                Allergies  Allergen Reactions   Lipitor [Atorvastatin] Other (See Comments)    Muscle aches    Consultations: Cardiology   Procedures/Studies: CT Head Wo Contrast  Result Date: 10/07/2021 CLINICAL DATA:  Head trauma, fall in the bathroom last night where EXAM: CT HEAD WITHOUT CONTRAST TECHNIQUE: Contiguous  axial images were obtained from the base of the skull through the vertex without intravenous contrast. COMPARISON:  None. FINDINGS: Brain: No evidence of acute infarction, hemorrhage, hydrocephalus, extra-axial collection or mass lesion/mass effect. Prominence of the ventricles and sulci secondary to moderate cerebral volume loss. Diffuse low-attenuation of the periventricular and subcortical white matter presumed advanced chronic microvascular ischemic changes. Vascular: Atherosclerotic calcification of bilateral vertebral arteries and carotid siphons. Skull: Normal. Negative for fracture or focal lesion. Sinuses/Orbits: Mucous retention cyst in the left maxillary sinus. Bilateral cataract surgery. Other: None. IMPRESSION: 1.  No acute intracranial abnormality. 2. Advanced cerebral volume loss and chronic microvascular ischemic changes of the white matter. 3.  Mild paranasal sinus disease. Electronically Signed   By: Keane Police D.O.   On: 10/07/2021 15:22   DG Chest Port 1 View  Result Date: 10/07/2021 CLINICAL DATA:  Family visited patient and noted weight loss and noticed signs of dehydration. Patient told EMS that he fell  last night in the bathroom after feeling dizzy and passing out. Pt c/o cough. Hx of HTN EXAM: PORTABLE CHEST 1 VIEW COMPARISON:  01/14/2018 and older studies. FINDINGS: Previous CABG surgery. Cardiac silhouette borderline enlarged. No mediastinal or hilar masses. Previously noted hiatal hernia is not defined, but likely unchanged. There are bilateral interstitial and patchy airspace opacities that are new. No visualized pleural effusion.  No pneumothorax. Skeletal structures are grossly intact. IMPRESSION: 1. Patchy bilateral interstitial and airspace lung opacities new compared to the prior exam. Findings consistent with multifocal pneumonia with atypical etiologies, including viral pneumonia, in the differential diagnosis. Electronically Signed   By: Lajean Manes [name]   On: 10/07/2021 14:49   ECHOCARDIOGRAM COMPLETE  Result Date: 10/08/2021    ECHOCARDIOGRAM REPORT   Patient Name:   DAVI KROON [facility] Date of Exam: 10/08/2021 Medical Rec #:  [id number]         Height:       64.0 in Accession #:    [id number]        Weight:       141.3 lb Date of Birth:  [birth year]          BSA:          1.688 m Patient Age:    87 years          BP:           131/91 mmHg Patient Gender: M                 HR:           69 bpm. Exam Location:  [facility] Procedure: 2D Echo, Cardiac Doppler and Color Doppler Indications:    Atrial Fibrillation  History:        Patient has prior history of Echocardiogram examinations, most                 recent 06/15/2019. Previous Myocardial Infarction and CAD, Prior                 CABG, Arrythmias:Atrial Fibrillation, Signs/Symptoms:Chest Pain;                 Risk Factors:Hypertension and Dyslipidemia. COVID +.  Sonographer:    Wenda Low Referring Phys: [place]  1. Left ventricular ejection fraction, by estimation, is 55%. The left ventricle has normal function. The left ventricle has no regional wall motion abnormalities. There is mild left ventricular  hypertrophy. Left ventricular diastolic parameters are indeterminate.  2. Right  ventricular systolic function is normal. The right ventricular size is normal. There is normal pulmonary artery systolic pressure.  3. Left atrial size was moderately dilated.  4. The mitral valve is degenerative. Trivial mitral valve regurgitation. No evidence of mitral stenosis. Moderate mitral annular calcification.  5. The aortic valve is tricuspid. There is severe calcifcation of the aortic valve. There is severe thickening of the aortic valve. Aortic valve regurgitation is mild. Moderate to severe aortic valve stenosis.  6. Aortic dilatation noted. There is mild dilatation of the aortic root, measuring 40 mm.  7. The inferior vena cava is normal in size with greater than 50% respiratory variability, suggesting right atrial pressure of 3 mmHg. FINDINGS  Left Ventricle: Left ventricular ejection fraction, by estimation, is 55%. The left ventricle has normal function. The left ventricle has no regional wall motion abnormalities. The left ventricular internal cavity size was normal in size. There is mild left ventricular hypertrophy. Left ventricular diastolic parameters are indeterminate. Right Ventricle: The right ventricular size is normal. No increase in right ventricular wall thickness. Right ventricular systolic function is normal. There is normal pulmonary artery systolic pressure. The tricuspid regurgitant velocity is 2.29 m/s, and  with an assumed right atrial pressure of 8 mmHg, the estimated right ventricular systolic pressure is 47.6 mmHg. Left Atrium: Left atrial size was moderately dilated. Right Atrium: Right atrial size was normal in size. Pericardium: There is no evidence of pericardial effusion. Mitral Valve: The mitral valve is degenerative in appearance. There is moderate thickening of the mitral valve leaflet(s). There is moderate calcification of the mitral valve leaflet(s). Moderate mitral annular calcification.  Trivial mitral valve regurgitation. No evidence of mitral valve stenosis. MV peak gradient, 5.1 mmHg. The mean mitral valve gradient is 1.0 mmHg. Tricuspid Valve: The tricuspid valve is normal in structure. Tricuspid valve regurgitation is mild . No evidence of tricuspid stenosis. Aortic Valve: The aortic valve is tricuspid. There is severe calcifcation of the aortic valve. There is severe thickening of the aortic valve. Aortic valve regurgitation is mild. Moderate to severe aortic stenosis is present. Aortic valve mean gradient measures 17.3 mmHg. Aortic valve peak gradient measures 30.5 mmHg. Aortic valve area, by VTI measures 0.92 cm. Pulmonic Valve: The pulmonic valve was normal in structure. Pulmonic valve regurgitation is not visualized. No evidence of pulmonic stenosis. Aorta: The aortic root is normal in size and structure and aortic dilatation noted. There is mild dilatation of the aortic root, measuring 40 mm. Venous: The inferior vena cava is normal in size with greater than 50% respiratory variability, suggesting right atrial pressure of 3 mmHg. IAS/Shunts: No atrial level shunt detected by color flow Doppler.  LEFT VENTRICLE PLAX 2D LVIDd:         3.60 cm   Diastology LVIDs:         2.60 cm   LV e' lateral:   17.10 cm/s LV PW:         1.40 cm   LV E/e' lateral: 6.3 LV IVS:        1.30 cm LVOT diam:     2.10 cm LV SV:         55 LV SV Index:   33 LVOT Area:     3.46 cm  RIGHT VENTRICLE RV Basal diam:  4.10 cm RV Mid diam:    4.00 cm RV S prime:     13.70 cm/s LEFT ATRIUM              Index  RIGHT ATRIUM           Index LA diam:        4.80 cm  2.84 cm/m   RA Area:     24.30 cm LA Vol (A2C):   109.0 ml 64.58 ml/m  RA Volume:   58.20 ml  34.48 ml/m LA Vol (A4C):   111.0 ml 65.76 ml/m LA Biplane Vol: 119.0 ml 70.50 ml/m  AORTIC VALVE                     PULMONIC VALVE AV Area (Vmax):    0.96 cm      PV Vmax:       0.54 m/s AV Area (Vmean):   0.81 cm      PV Peak grad:  1.1 mmHg AV Area  (VTI):     0.92 cm AV Vmax:           276.00 cm/s AV Vmean:          191.667 cm/s AV VTI:            0.599 m AV Peak Grad:      30.5 mmHg AV Mean Grad:      17.3 mmHg LVOT Vmax:         76.40 cm/s LVOT Vmean:        44.650 cm/s LVOT VTI:          0.160 m LVOT/AV VTI ratio: 0.27  AORTA Ao Root diam: 4.00 cm Ao Asc diam:  3.90 cm MITRAL VALVE                TRICUSPID VALVE MV Area (PHT): 4.46 cm     TR Peak grad:   21.0 mmHg MV Area VTI:   2.01 cm     TR Vmax:        229.00 cm/s MV Peak grad:  5.1 mmHg MV Mean grad:  1.0 mmHg     SHUNTS MV Vmax:       1.13 m/s     Systemic VTI:  0.16 m MV Vmean:      50.6 cm/s    Systemic Diam: 2.10 cm MV Decel Time: 170 msec MV E velocity: 107.00 cm/s Jenkins Rouge MD Electronically signed by Jenkins Rouge MD Signature Date/Time: 10/08/2021/2:49:28 PM    Final      Discharge Exam: Vitals:   10/08/21 0919 10/08/21 1434  BP: 129/85 129/77  Pulse: 74 60  Resp:  20  Temp:  97.9 F (36.6 C)  SpO2:  97%   Vitals:   10/08/21 0234 10/08/21 0535 10/08/21 0919 10/08/21 1434  BP: 127/77 (!) 131/91 129/85 129/77  Pulse: (!) 57 (!) 51 74 60  Resp: 18 18  20   Temp: 97.8 F (36.6 C) 97.7 F (36.5 C)  97.9 F (36.6 C)  TempSrc:    Oral  SpO2: 97% 99%  97%  Weight:      Height:        General: Pt is alert, awake, not in acute distress Cardiovascular: Irregular rate, moderate 3/6 systolic murmur Respiratory: CTA bilaterally, no wheezing, no rhonchi Abdominal: Soft, NT, ND, bowel sounds + Extremities: no edema, no cyanosis    The results of significant diagnostics from this hospitalization (including imaging, microbiology, ancillary and laboratory) are listed below for reference.     Microbiology: Recent Results (from the past 240 hour(s))  Blood culture (routine x 2)     Status: None (Preliminary result)   Collection Time: 10/07/21  3:21 PM   Specimen: BLOOD LEFT WRIST  Result Value Ref Range Status   Specimen Description BLOOD LEFT WRIST BOTTLES DRAWN  AEROBIC ONLY  Final   Special Requests Blood Culture adequate volume  Final   Culture   Final    NO GROWTH < 24 HOURS Performed at [facility], [address] [place], [place] [address]    Report Status PENDING  Incomplete  Blood culture (routine x 2)     Status: None (Preliminary result)   Collection Time: 10/07/21  3:30 PM   Specimen: BLOOD LEFT ARM  Result Value Ref Range Status   Specimen Description BLOOD LEFT ARM BOTTLES DRAWN AEROBIC AND ANAEROBIC  Final   Special Requests Blood Culture adequate volume  Final   Culture   Final    NO GROWTH < 24 HOURS Performed at [facility], [address], [place], [place] [address]    Report Status PENDING  Incomplete     Labs: BNP (last 3 results) No results for input(s): BNP in the last 8760 hours. Basic Metabolic Panel: Recent Labs  Lab 10/03/21 0952 10/07/21 1419 10/07/21 1748 10/08/21 0414  NA 133* 133*  --  137  K 4.6 4.3  --  4.1  CL 98 102  --  107  CO2 20 24  --  23  GLUCOSE 95 92  --  96  BUN 28 35*  --  32*  CREATININE 1.46* 1.28*  --  1.21  CALCIUM 9.6 8.9  --  8.7*  MG  --   --  2.0  --    Liver Function Tests: Recent Labs  Lab 10/03/21 0952 10/07/21 1419 10/08/21 0414  AST 30 22 20   ALT 14 13 13   ALKPHOS 84 63 57  BILITOT 0.8 0.8 0.4  PROT 6.2 5.4* 5.0*  ALBUMIN 3.4* 2.7* 2.4*   No results for input(s): LIPASE, AMYLASE in the last 168 hours. No results for input(s): AMMONIA in the last 168 hours. CBC: Recent Labs  Lab 10/03/21 0952 10/07/21 1419 10/08/21 0414  WBC 4.9 6.9 8.1  NEUTROABS 3.3 5.1 6.1  HGB 12.6* 11.6* 10.2*  HCT 36.1* 36.5* 32.2*  MCV 79 87.7 87.7  PLT 195 248 255   Cardiac Enzymes: Recent Labs  Lab 10/07/21 2213  CKTOTAL 21*   BNP: Invalid input(s): POCBNP CBG: No results for input(s): GLUCAP in the last 168 hours. D-Dimer Recent Labs    10/07/21 1529 10/08/21 0414  DDIMER 1.97* 2.01*   Hgb A1c No results for input(s): HGBA1C in the last 72 hours. Lipid  Profile No results for input(s): CHOL, HDL, LDLCALC, TRIG, CHOLHDL, LDLDIRECT in the last 72 hours. Thyroid function studies Recent Labs    10/07/21 1748  TSH 0.915   Anemia work up Recent Labs    10/07/21 1529 10/08/21 0414  FERRITIN 226 191   Urinalysis    Component Value Date/Time   COLORURINE YELLOW 10/07/2021 [place] 10/07/2021 1757   APPEARANCEUR Clear 04/09/2019 1115   LABSPEC 1.017 10/07/2021 1757   PHURINE 5.0 10/07/2021 1757   GLUCOSEU NEGATIVE 10/07/2021 1757   HGBUR NEGATIVE 10/07/2021 [place] 10/07/2021 1757   BILIRUBINUR Negative 04/09/2019 1115   KETONESUR NEGATIVE 10/07/2021 1757   PROTEINUR 30 (A) 10/07/2021 1757   UROBILINOGEN 0.2 03/16/2013 0443   NITRITE NEGATIVE 10/07/2021 1757   LEUKOCYTESUR NEGATIVE 10/07/2021 1757   Sepsis Labs Invalid input(s): PROCALCITONIN,  WBC,  LACTICIDVEN Microbiology Recent Results (from the past 240  hour(s))  Blood culture (routine x 2)     Status: None (Preliminary result)   Collection Time: 10/07/21  3:21 PM   Specimen: BLOOD LEFT WRIST  Result Value Ref Range Status   Specimen Description BLOOD LEFT WRIST BOTTLES DRAWN AEROBIC ONLY  Final   Special Requests Blood Culture adequate volume  Final   Culture   Final    NO GROWTH < 24 HOURS Performed at [facility], [address], [place], [place] [address]    Report Status PENDING  Incomplete  Blood culture (routine x 2)     Status: None (Preliminary result)   Collection Time: 10/07/21  3:30 PM   Specimen: BLOOD LEFT ARM  Result Value Ref Range Status   Specimen Description BLOOD LEFT ARM BOTTLES DRAWN AEROBIC AND ANAEROBIC  Final   Special Requests Blood Culture adequate volume  Final   Culture   Final    NO GROWTH < 24 HOURS Performed at [facility], [address], [place], [place] [address]    Report Status PENDING  Incomplete     Time coordinating discharge: 35 minutes  SIGNED:   Rodena Goldmann, DO [facility] 10/08/2021, 3:05 PM  If 7PM-7AM, please contact night-coverage [email or web address]

## 2021-10-08 NOTE — TOC Benefit Eligibility Note (Signed)
Patient Teacher, English as a foreign language completed.    The patient is currently admitted and upon discharge could be taking Eliquis 2.5 mg.  The current 30 day co-pay is, $33.20.   The patient is insured through [place], [place] Patient Advocate Specialist [place] Patient Advocate Team Direct Number: [phone]  Fax: [phone]

## 2021-10-08 NOTE — TOC Initial Note (Signed)
Transition of Care [facility]) - Initial/Assessment Note    Patient Details  Name: Frederick Davis MRN: [id number] Date of Birth: 02-04-1923  Transition of Care [facility]) CM/SW Contact:    Salome Arnt, Evan Phone Number: 10/08/2021, 9:30 AM  Clinical Narrative:  Pt admitted due to new onset atrial fibrillation. He reports he lives with his wife. Pt ambulates with a cane at baseline. He indicates they help each other at home and manage fine. PT evaluated pt and recommend home health. LCSW discussed home health with pt and pt states he has had HHPT before and doesn't want any home health again. He reports no other needs at this time. TOC will continue to follow.                  Expected Discharge Plan: [place] Barriers to Discharge: Continued Medical Work up   Patient Goals and CMS Choice Patient states their goals for this hospitalization and ongoing recovery are:: return home   Choice offered to / list presented to : Patient  Expected Discharge Plan and Services Expected Discharge Plan: [place] In-house Referral: Clinical Social Work     Living arrangements for the past 2 months: Single Family Home                 DME Arranged: N/A DME Agency: NA       HH Arranged: Refused HH          Prior Living Arrangements/Services Living arrangements for the past 2 months: Single Family Home Lives with:: Spouse Patient language and need for interpreter reviewed:: Yes Do you feel safe going back to the place where you live?: Yes        Care giver support system in place?: Yes (comment) Current home services: DME (cane, walker, shower chair, BSC) Criminal Activity/Legal Involvement Pertinent to Current Situation/Hospitalization: No - Comment as needed  Activities of Daily Living Home Assistive Devices/Equipment: Cane (specify quad or straight) ADL Screening (condition at time of admission) Patient's cognitive ability adequate to  safely complete daily activities?: Yes Is the patient deaf or have difficulty hearing?: No Does the patient have difficulty seeing, even when wearing glasses/contacts?: No Does the patient have difficulty concentrating, remembering, or making decisions?: No Patient able to express need for assistance with ADLs?: Yes Does the patient have difficulty dressing or bathing?: No Independently performs ADLs?: Yes (appropriate for developmental age) Does the patient have difficulty walking or climbing stairs?: Yes Weakness of Legs: Both Weakness of Arms/Hands: None  Permission Sought/Granted                  Emotional Assessment   Attitude/Demeanor/Rapport: Engaged Affect (typically observed): Appropriate Orientation: : Oriented to Self, Oriented to Place, Oriented to  Time, Oriented to Situation Alcohol / Substance Use: Not Applicable Psych Involvement: No (comment)  Admission diagnosis:  New onset atrial fibrillation ([place]) [I48.91] Atrial fibrillation, unspecified type ([place]) [I48.91] Community acquired pneumonia, unspecified laterality [J18.9] Patient Active Problem List   Diagnosis Date Noted   New onset atrial fibrillation ([place]) 10/07/2021   COVID-19 virus infection 10/07/2021   Hypothyroidism 04/04/2020   Calculus of gallbladder without cholecystitis without obstruction 11/30/2019   Exocrine pancreatic insufficiency 11/30/2019   Educated about COVID-19 virus infection 11/22/2019   Diarrhea in adult patient 06/30/2019   Cuff erosion of artificial urinary sphincter ([place]) 02/23/2018   Malfunction of penile prosthesis ([place]) 12/15/2017   Renal cell carcinoma, right ([place]) 08/11/2017  Vitamin D deficiency 08/11/2017   ASCVD (arteriosclerotic cardiovascular disease) 08/11/2017   Aortic stenosis 05/28/2017   Mitral regurgitation 05/28/2017   Chest pain 05/27/2017   Coronary artery disease involving native coronary artery of native heart without angina pectoris 03/19/2017    Dyslipidemia 03/19/2017   S/P CABG x 3 12/08/2015   Unstable angina (HCC)    Chest pain with moderate risk of acute coronary syndrome 12/05/2015   Aortic stenosis, mild-2014 12/05/2015   Anginal pain ([place]) 10/17/2015   History of renal cell cancer-s/p nephrectomy 07/19/2013   Hyperlipidemia 07/19/2013   Hypotension arterial 03/18/2013   Complete heart block ([place]) 03/18/2013   Anemia    CKD (chronic kidney disease) stage 3, GFR 30-59 ml/min (HCC) 03/12/2013   Acute MI, inferior wall, initial episode of care ([place]) 03/12/2013   Impotence 03/12/2013   History of renal stone 03/12/2013   Hiatal hernia 03/12/2013   H/O lithotripsy 03/12/2013   SCCA (squamous cell carcinoma) of skin 03/12/2013   S/P prostatectomy, radical 03/12/2013   Left renal mass 03/12/2013   Arthritis 03/12/2013   History of STEMI- May 2014 03/12/2013   CAD S/P RCA thrombectomy May 2014, CABG 11/2015 03/12/2013   Cardiogenic shock ([place]) 03/12/2013   Acute respiratory failure ([place]) 03/12/2013   Altered mental status 03/12/2013   Essential hypertension, benign 01/14/2013   Colostomy in place [facility]) 01/14/2013   Prostate cancer ([place]) 01/14/2013   Renal cell carcinoma, Right 01/14/2013   Osteoporosis, unspecified 01/14/2013   PCP:  Claretta Fraise, MD Pharmacy:   [facility], [address] [place] [address] Phone: [phone] Fax: [phone]     Social Determinants of Health (SDOH) Interventions    Readmission Risk Interventions No flowsheet data found.

## 2021-10-08 NOTE — TOC Transition Note (Signed)
Transition of Care [facility]) - CM/SW Discharge Note   Patient Details  Name: Frederick Davis MRN: [id number] Date of Birth: 01-01-1923  Transition of Care [facility]) CM/SW Contact:  Salome Arnt, LCSW Phone Number: 10/08/2021, 12:26 PM   Clinical Narrative:  LCSW spoke with pt's wife and son who report pt has some confusion. He has never had home health services before but they request any assistance he can have. Discussed HHPT, RN, SW, and aid. Family agreeable and request [facility]. Referred and accepted by [facility]. They also request information on private duty aid at home. Contact information for [facility] and [facility] provided. Pt's wife requests BSC. Orders in and referred to [facility] to drop ship. Pharmacy to provide eliquis voucher. Anticipate d/c today.      Final next level of care: Home w Home Health Services Barriers to Discharge: Continued Medical Work up   Patient Goals and CMS Choice Patient states their goals for this hospitalization and ongoing recovery are:: return home   Choice offered to / list presented to : Patient  Discharge Placement                  Name of family member notified: Wife, son Patient and family notified of of transfer: 10/08/21  Discharge Plan and Services In-house Referral: Clinical Social Work              DME Arranged: Engineer, civil (consulting) DME Agency: [facility] Date DME Agency Contacted: 10/08/21 Time DME Agency Contacted: 13 Representative spoke with at DME Agency: Caryl Pina HH Arranged: RN, PT, Social Work, Nurse's Aide [place] Agency: [place] Date [place]: 10/08/21 Time [place]: 1226 Representative spoke with at [place]: [place] ([place]) Interventions     Readmission Risk Interventions No flowsheet data found.

## 2021-10-08 NOTE — Evaluation (Signed)
Physical Therapy Evaluation Patient Details Name: Frederick Davis MRN: [id number] DOB: 1923/06/16 Today's Date: 10/08/2021  History of Present Illness  Frederick Davis is a 87 y.o. male with medical history significant for aortic stenosis, CABG, hypertension, renal prostate cancer, colostomy status  Patient's was brought to the ED with complaints of dizziness, weight loss, dehydration and passing out.  About thanksgiving, symptoms started with diarrhea, generalized weakness, and then cough with difficulty breathing.  Saw his primary care provider about a week later, and was diagnosed with COVID.  He was given some prescriptions which included cough medications, but he tells me he declined any specific medications for treatment of COVID (molnupiravir- is listed on patient's medication list, he did not take it).  Patients son  Eddie Dibbles is at bedside, and is from out of town.  From a respiratory standpoint, his cough and difficulty breathing has mostly resolved, but since the COVID symptoms started around Thanksgiving, he has been weak, not eating much.  Today while he was having a bowel movements, passed out, family found him, patient had fallen onto his left side.  He hit his head.   Clinical Impression  Patient functioning near baseline for functional mobility and gait demonstrating increased time with labored movement for sitting up at bedside, had to lean on nearby objects for support when ambulating with SPC, no loss of balance and limited mostly due to fatigue.  Patient tolerated sitting up in chair after therapy - nurse aware.  Patient will benefit from continued skilled physical therapy in hospital and recommended venue below to increase strength, balance, endurance for safe ADLs and gait.          Recommendations for follow up therapy are one component of a multi-disciplinary discharge planning process, led by the attending physician.  Recommendations may be updated based on patient status,  additional functional criteria and insurance authorization.  Follow Up Recommendations Home health PT    Assistance Recommended at Discharge Intermittent Supervision/Assistance  Functional Status Assessment Patient has had a recent decline in their functional status and demonstrates the ability to make significant improvements in function in a reasonable and predictable amount of time.  Equipment Recommendations  None recommended by PT    Recommendations for Other Services       Precautions / Restrictions Precautions Precautions: Fall Restrictions Weight Bearing Restrictions: No      Mobility  Bed Mobility Overal bed mobility: Needs Assistance Bed Mobility: Supine to Sit     Supine to sit: Min guard;Min assist     General bed mobility comments: increased time, labored movement    Transfers Overall transfer level: Needs assistance Equipment used: Straight cane;1 person hand held assist Transfers: Sit to/from Stand;Bed to chair/wheelchair/BSC Sit to Stand: Supervision;Min guard   Step pivot transfers: Min guard       General transfer comment: labored movement, increased time    Ambulation/Gait Ambulation/Gait assistance: Supervision;Min guard Gait Distance (Feet): 20 Feet Assistive device: Straight cane;1 person hand held assist Gait Pattern/deviations: Decreased step length - right;Decreased step length - left;Decreased stride length Gait velocity: decreased     General Gait Details: slolw labored cadence with frequent leaning on nearby objects for support, no loss of balance, limited mostly due to c/o fatigue  Stairs            Wheelchair Mobility    Modified Rankin (Stroke Patients Only)       Balance Overall balance assessment: Needs assistance Sitting-balance support: Feet supported;No upper extremity supported Sitting balance-Leahy  Scale: Good Sitting balance - Comments: seated at EOB   Standing balance support: During functional  activity;Single extremity supported Standing balance-Leahy Scale: Fair Standing balance comment: using RW with frequent leaning on nearby objects for support                             Pertinent Vitals/Pain Pain Assessment: No/denies pain    Home Living Family/patient expects to be discharged to:: Private residence Living Arrangements: Spouse/significant other Available Help at Discharge: Family Type of Home: House Home Access: Stairs to enter Entrance Stairs-Rails: Left Entrance Stairs-Number of Steps: 3-4   Home Layout: One level Home Equipment: Conservation officer, nature (2 wheels);Cane - single point      Prior Function Prior Level of Function : Needs assist       Physical Assist : Mobility (physical) Mobility (physical): Bed mobility;Transfers;Gait;Stairs   Mobility Comments: household ambulator using SPC while leaning on walls, furniture, uses RW for longer distances ADLs Comments: assisted by family     Hand Dominance   Dominant Hand: Right    Extremity/Trunk Assessment   Upper Extremity Assessment Upper Extremity Assessment: Overall WFL for tasks assessed    Lower Extremity Assessment Lower Extremity Assessment: Generalized weakness    Cervical / Trunk Assessment Cervical / Trunk Assessment: Normal  Communication   Communication: No difficulties  Cognition Arousal/Alertness: Awake/alert Behavior During Therapy: WFL for tasks assessed/performed Overall Cognitive Status: Within Functional Limits for tasks assessed                                 General Comments: requires increased time to respond to most questions and/or directions        General Comments      Exercises     Assessment/Plan    PT Assessment Patient needs continued PT services  PT Problem List Decreased strength;Decreased activity tolerance;Decreased balance;Decreased mobility       PT Treatment Interventions DME instruction;Gait training;Stair  training;Functional mobility training;Therapeutic activities;Therapeutic exercise;Balance training;Patient/family education    PT Goals (Current goals can be found in the Care Plan section)  Acute Rehab PT Goals Patient Stated Goal: return home with family to assist PT Goal Formulation: With patient Time For Goal Achievement: 10/11/21 Potential to Achieve Goals: Good    Frequency Min 3X/week   Barriers to discharge        Co-evaluation               AM-PAC PT "6 Clicks" Mobility  Outcome Measure Help needed turning from your back to your side while in a flat bed without using bedrails?: A Little Help needed moving from lying on your back to sitting on the side of a flat bed without using bedrails?: A Little Help needed moving to and from a bed to a chair (including a wheelchair)?: A Little Help needed standing up from a chair using your arms (e.g., wheelchair or bedside chair)?: A Little Help needed to walk in hospital room?: A Little Help needed climbing 3-5 steps with a railing? : A Lot 6 Click Score: 17    End of Session   Activity Tolerance: Patient tolerated treatment well;Patient limited by fatigue Patient left: in chair;with call bell/phone within reach;with nursing/sitter in room Nurse Communication: Mobility status PT Visit Diagnosis: Unsteadiness on feet (R26.81);Other abnormalities of gait and mobility (R26.89);Muscle weakness (generalized) (M62.81)    Time: 4403-4742 PT Time Calculation (min) (  ACUTE ONLY): 26 min   Charges:   PT Evaluation $PT Eval Moderate Complexity: 1 Mod PT Treatments $Therapeutic Activity: 23-37 mins        12:12 PM, 10/08/21 Lonell Grandchild, MPT Physical Therapist with [facility] [phone] office [phone] mobile phone

## 2021-10-08 NOTE — Progress Notes (Signed)
*  PRELIMINARY RESULTS* Echocardiogram 2D Echocardiogram has been performed.  Frederick Davis 10/08/2021, 1:38 PM

## 2021-10-09 ENCOUNTER — Telehealth: Payer: Self-pay | Admitting: Cardiology

## 2021-10-09 ENCOUNTER — Telehealth: Payer: Self-pay

## 2021-10-09 NOTE — Telephone Encounter (Signed)
Transition Care Management Follow-up Telephone Call Date of discharge and from where: 10/08/21 - Frederick Davis - new onset A.fib, pneumonia How have you been since you were released from the hospital? Not doing well -cannot walk, very weak, barely eating and refusing medicine Any questions or concerns? Yes - appt tomorrow asap with Monia Pouch to discuss SNF placement as he is completely dependent now  Items Reviewed: Did the pt receive and understand the discharge instructions provided? Yes  Medications obtained and verified? Yes  Other? No  Any new allergies since your discharge? No  Dietary orders reviewed? Yes Do you have support at home? Yes   Home Care and Equipment/Supplies: Were home health services ordered? yes If so, what is the name of the agency? [facility]  Has the agency set up a time to come to the patient's home? yes Were any new equipment or medical supplies ordered?  Yes: bedside commode What is the name of the medical supply agency? unsure Were you able to get the supplies/equipment? yes Do you have any questions related to the use of the equipment or supplies? No  Functional Questionnaire: (I = Independent and D = Dependent) ADLs: D  Bathing/Dressing- D  Meal Prep- D  Eating- D  Maintaining continence- D  Transferring/Ambulation- D  Managing Meds- D  Follow up appointments reviewed:  PCP Hospital f/u appt confirmed? Yes  Scheduled to see Monia Pouch on 12/21 @ 11:35. [facility] f/u appt confirmed? Yes  Scheduled to see [place] on 12/12/2021 @ 2:20. Are transportation arrangements needed?  Yes - will have to do virtual visit as they are unable to get him into the car If their condition worsens, is the pt aware to call PCP or go to the Emergency Dept.? Yes Was the patient provided with contact information for the PCP's office or ED? Yes Was to pt encouraged to call back with questions or concerns? Yes

## 2021-10-09 NOTE — Telephone Encounter (Signed)
New Message:    Wife called and said patient was admitted to [facility] on 10-07-21 and was discharged 10-08-21. She said she was told to contact Dr Percival Spanish and make him aware of patient's admission. (New onset of Atrial Fib)Please see notes in Epic.

## 2021-10-09 NOTE — Telephone Encounter (Signed)
Spoke with patient's wife (son Frederick Davis is in from [place]). Patient was in hospital, dx'ed with new AFib. Per wife, patient is weak, can't walk, won't eat/drink. Refusing to take meds. Son states he only took 1/2 tablet of eliquis yesterday. They express great need for assistance in caring for patient. Advised they contact Dr. Livia Snellen' office if they need assistance in possible getting a placement with SNF, nursing home, rehab, etc. Wife had stated they would call hospital for this, but explained that once discharged, hospital providers would not order these arrangements. Will route to MD for review and any suggestions.

## 2021-10-10 ENCOUNTER — Ambulatory Visit (INDEPENDENT_AMBULATORY_CARE_PROVIDER_SITE_OTHER): Payer: Medicare Other | Admitting: Licensed Clinical Social Worker

## 2021-10-10 ENCOUNTER — Telehealth (INDEPENDENT_AMBULATORY_CARE_PROVIDER_SITE_OTHER): Payer: Medicare Other | Admitting: Family Medicine

## 2021-10-10 ENCOUNTER — Encounter: Payer: Self-pay | Admitting: Family Medicine

## 2021-10-10 DIAGNOSIS — I359 Nonrheumatic aortic valve disorder, unspecified: Secondary | ICD-10-CM | POA: Diagnosis not present

## 2021-10-10 DIAGNOSIS — R55 Syncope and collapse: Secondary | ICD-10-CM

## 2021-10-10 DIAGNOSIS — I2510 Atherosclerotic heart disease of native coronary artery without angina pectoris: Secondary | ICD-10-CM

## 2021-10-10 DIAGNOSIS — M1990 Unspecified osteoarthritis, unspecified site: Secondary | ICD-10-CM

## 2021-10-10 DIAGNOSIS — I10 Essential (primary) hypertension: Secondary | ICD-10-CM

## 2021-10-10 DIAGNOSIS — I4891 Unspecified atrial fibrillation: Secondary | ICD-10-CM | POA: Diagnosis not present

## 2021-10-10 DIAGNOSIS — E782 Mixed hyperlipidemia: Secondary | ICD-10-CM

## 2021-10-10 DIAGNOSIS — Z951 Presence of aortocoronary bypass graft: Secondary | ICD-10-CM

## 2021-10-10 DIAGNOSIS — I350 Nonrheumatic aortic (valve) stenosis: Secondary | ICD-10-CM

## 2021-10-10 DIAGNOSIS — N1832 Chronic kidney disease, stage 3b: Secondary | ICD-10-CM

## 2021-10-10 DIAGNOSIS — E039 Hypothyroidism, unspecified: Secondary | ICD-10-CM

## 2021-10-10 DIAGNOSIS — D509 Iron deficiency anemia, unspecified: Secondary | ICD-10-CM

## 2021-10-10 DIAGNOSIS — Z09 Encounter for follow-up examination after completed treatment for conditions other than malignant neoplasm: Secondary | ICD-10-CM | POA: Diagnosis not present

## 2021-10-10 DIAGNOSIS — Z933 Colostomy status: Secondary | ICD-10-CM

## 2021-10-10 DIAGNOSIS — U071 COVID-19: Secondary | ICD-10-CM

## 2021-10-10 DIAGNOSIS — J189 Pneumonia, unspecified organism: Secondary | ICD-10-CM | POA: Diagnosis not present

## 2021-10-10 NOTE — Telephone Encounter (Signed)
Hospice seems appropriate. If not desired, then SNF would be next choice.

## 2021-10-10 NOTE — Chronic Care Management (AMB) (Signed)
Chronic Care Management    Clinical Social Work Note  10/10/2021 Name: Frederick Davis MRN: [id number] DOB: 04-27-1923  Frederick Davis is a 87 y.o. year old male who is a primary care patient of Stacks, Broadus John, MD. The CCM team was consulted to assist the patient with chronic disease management and/or care coordination needs related to: [employer] .   Engaged with patient / son of patient, Frederick Davis, by telephone for initial visit in response to provider referral for social work chronic care management and care coordination services.   Consent to Services:  The patient was given the following information about Chronic Care Management services today, agreed to services, and gave verbal consent: 1. CCM service includes personalized support from designated clinical staff supervised by the primary care provider, including individualized plan of care and coordination with other care providers 2. 24/7 contact phone numbers for assistance for urgent and routine care needs. 3. Service will only be billed when office clinical staff spend 20 minutes or more in a month to coordinate care. 4. Only one practitioner may furnish and bill the service in a calendar month. 5.The patient may stop CCM services at any time (effective at the end of the month) by phone call to the office staff. 6. The patient will be responsible for cost sharing (co-pay) of up to 20% of the service fee (after annual deductible is met). Patient agreed to services and consent obtained.  Patient agreed to services and consent obtained.   Assessment: Review of patient past medical history, allergies, medications, and health status, including review of relevant consultants reports was performed today as part of a comprehensive evaluation and provision of chronic care management and care coordination services.     SDOH (Social Determinants of Health) assessments and interventions performed:  SDOH Interventions    Flowsheet  Row Most Recent Value  SDOH Interventions   Physical Activity Interventions Other (Comments)  [walking challenges]  Stress Interventions Other (Comment)  [client has stress related to managing medical needs]  Depression Interventions/Treatment  --  [informed Meyer Cory, son of client, about LCSW support for client and about RNCM support for client]        Advanced Directives Status: See [employer]yn[employer] application for related entries.  CCM Care Plan  Allergies  Allergen Reactions   Lipitor [Atorvastatin] Other (See Comments)    Muscle aches    Outpatient Encounter Medications as of 10/10/2021  Medication Sig   amoxicillin-clavulanate (AUGMENTIN) 875-125 MG tablet Take 1 tablet by mouth 2 (two) times daily for 5 days.   apixaban (ELIQUIS) 2.5 MG TABS tablet Take 1 tablet (2.5 mg total) by mouth 2 (two) times daily.   benzonatate (TESSALON PERLES) 100 MG capsule Take 1 capsule (100 mg total) by mouth 3 (three) times daily as needed.   carvedilol (COREG) 3.125 MG tablet TAKE 1 TABLET TWICE A DAY WITH MEALS (BREAKFAST AND SUPPER)   furosemide (LASIX) 20 MG tablet TAKE 1 TABLET BY MOUTH DAILY AS NEEDED.   guaiFENesin-codeine (CHERATUSSIN AC) 100-10 MG/5ML syrup Take 5 mLs by mouth every 4 (four) hours as needed for cough.   levothyroxine (SYNTHROID) 75 MCG tablet Take 1 tablet (75 mcg total) by mouth daily.   lipase/protease/amylase (CREON) 36000 UNITS CPEP capsule Take 1 capsule by mouth at the first bite of each meal, and 1 capsule during meal, and 1 capsule at the last bite of a meal.  Allow for 3 meals per day.  If eating snacks take 1 capsule with  snack.   meclizine (ANTIVERT) 25 MG tablet Take 25 mg by mouth 3 (three) times daily as needed for dizziness.   Multiple Vitamin (MULTIVITAMIN) capsule Take 1 capsule daily by mouth.   Multiple Vitamins-Minerals (ZINC PO) Take by mouth at bedtime.   nitroGLYCERIN (NITROSTAT) 0.4 MG SL tablet PLACE 1 TABLET UNDER THE TONGUE AT ONSET OF CHEST PAIN  EVERY 5 MINTUES UP TO 3 TIMES AS NEEDED FOR CHEST PAIN   Facility-Administered Encounter Medications as of 10/10/2021  Medication   etomidate (AMIDATE) injection   lidocaine (cardiac) 100 mg/69ml (XYLOCAINE) 20 MG/ML injection 2%   succinylcholine (ANECTINE) injection    Patient Active Problem List   Diagnosis Date Noted   New onset atrial fibrillation (HCC) 10/07/2021   COVID-19 virus infection 10/07/2021   Hypothyroidism 04/04/2020   Calculus of gallbladder without cholecystitis without obstruction 11/30/2019   Exocrine pancreatic insufficiency 11/30/2019   Educated about COVID-19 virus infection 11/22/2019   Diarrhea in adult patient 06/30/2019   Cuff erosion of artificial urinary sphincter (HCC) 02/23/2018   Malfunction of penile prosthesis (HCC) 12/15/2017   Renal cell carcinoma, right (HCC) 08/11/2017   Vitamin D deficiency 08/11/2017   ASCVD (arteriosclerotic cardiovascular disease) 08/11/2017   Aortic stenosis 05/28/2017   Mitral regurgitation 05/28/2017   Chest pain 05/27/2017   Coronary artery disease involving native coronary artery of native heart without angina pectoris 03/19/2017   Dyslipidemia 03/19/2017   S/P CABG x 3 12/08/2015   Unstable angina (HCC)    Chest pain with moderate risk of acute coronary syndrome 12/05/2015   Aortic stenosis, mild-2014 12/05/2015   Anginal pain (HCC) 10/17/2015   History of renal cell cancer-s/p nephrectomy 07/19/2013   Hyperlipidemia 07/19/2013   Hypotension arterial 03/18/2013   Complete heart block (HCC) 03/18/2013   Anemia    CKD (chronic kidney disease) stage 3, GFR 30-59 ml/min (HCC) 03/12/2013   Acute MI, inferior wall, initial episode of care (HCC) 03/12/2013   Impotence 03/12/2013   History of renal stone 03/12/2013   Hiatal hernia 03/12/2013   H/O lithotripsy 03/12/2013   SCCA (squamous cell carcinoma) of skin 03/12/2013   S/P prostatectomy, radical 03/12/2013   Left renal mass 03/12/2013   Arthritis 03/12/2013    History of STEMI- May 2014 03/12/2013   CAD S/P RCA thrombectomy May 2014, CABG 11/2015 03/12/2013   Cardiogenic shock (HCC) 03/12/2013   Acute respiratory failure (HCC) 03/12/2013   Altered mental status 03/12/2013   Essential hypertension, benign 01/14/2013   Colostomy in place [facility]) 01/14/2013   Prostate cancer (HCC) 01/14/2013   Renal cell carcinoma, Right 01/14/2013   Osteoporosis, unspecified 01/14/2013    Conditions to be addressed/monitored: monitor client completion of ADLs.  Care Plan : LCSW Care Plan  Updates made by Isaiah Blakes, LCSW since 10/10/2021 12:00 AM     Problem: Coping Skills (General Plan of Care)      Goal: Coping Skills Enhanced. Complete ADLs. Cooperate with in home care providers assisting him   Start Date: 10/10/2021  Expected End Date: 01/07/2022  This Visit's Progress: Not on track  Priority: High  Note:   Current barriers:   Patient in need of assistance with connecting to community resources for possible help with completing ADLs and other daily activitiesd Patient is unable to independently navigate community resource options without care coordination support Mobility issues Fatigue issues  Clinical Goals:  Client to communicate with LCSW in next 30 days to discuss ADLs completion of client and client mobility issues Client to  cooperate with care providers assisting him in the home for next 30 days Client to attend scheduled medical appointments in next 30 days  Clinical Interventions:  Collaboration with Mechele Claude, MD regarding development and update of comprehensive plan of care as evidenced by provider attestation and co-signature Discussed current client needs with Mallie Darting, son of client. Discussed client ambulation challenges.  Discussed home health services of client. Carlena Sax said that [facility] had received orders for home health for client. Carlena Sax plans to call [facility] today to talk with  representative about home health services ordered for client Reviewed energy level of client. Client has reduced energy.  Discussed level of care needs of client. Kari Baars FNP had communicated with LCSW today regarding placement needs for client. LCSW talked today via phone with Carlena Sax, son of client.about client placement options.  LCSW talked with Carlena Sax about [facility] in [place], [place]. LCSW talked with Carlena Sax about [facility] in [place], [place]. LCSW talked with Carlena Sax about [facility] in [place], [place].  Carlena Sax said he thinks client is doing a little better today and he is hoping that family can hire in home care workers to assist client with his current needs.  LCSW gave Carlena Sax the name and phone number of [facility] for in home care services in [place], [place]. Discussed fact that Carlena Sax , son of client, resides in [place] and usually client is at home with his wife, Danzell Wesling. Carlena Sax said he hopes to make some arrangements for in home care support for client before he Carlena Sax) has to return to [place]. Discussed fall history of client .   Patient Coping Skills:  Has family support from spouse and from son  Patient Deficits:  Mobility issues Fatigue issues Fall Risk  Patient Goals: In next 30 days, patient will: Cooperate with in home care providers assisting client in the home Communicate with spouse and son about client needs Take time to rest, sleep and eats meals on regular schedule  Follow Up Plan: LCSW to call client , spouse of client, or son of client on 12/06/21 at 3:00 PM to assess client needs    Kelton Pillar.Armoni Depass MSW, LCSW Licensed Clinical Social Worker [facility] Care Management [phone]

## 2021-10-10 NOTE — Patient Instructions (Addendum)
Visit Information  Patient goals:  Coping Skills Enhanced. Complete ADLs as able. Cooperate with in home care providers  Time Frame:  Short Term Goal Priority:  High Progress:  Not On Track  Start Date:  10/10/21 Expected completion date:  01/07/22  Follow Up Date:  12/06/21 at 3:00 PM  Coping Skills Enhanced. Complete ADLs as able. Cooperate with in home care providers  Patient Coping Skills:  Has family support from spouse and from son  Patient Deficits:  Mobility issues Fatigue issues Fall Risk  Patient Goals: In next 30 days, patient will: Cooperate with in home care providers assisting client in the home Communicate with spouse and son about client needs Take time to rest, sleep and eats meals on regular schedule  Follow Up Plan: LCSW to call client , spouse of client, or son of client on 12/06/21 at 3:00 PM to assess client needs   Norva Riffle.Tarek Cravens MSW, LCSW Licensed Clinical Social Worker [facility] Care Management [phone]

## 2021-10-10 NOTE — Progress Notes (Signed)
Hello Fernando,  Your lab result is normal and/or stable.Some minor variations that are not significant are commonly marked abnormal, but do not represent any medical problem for you.  Best regards, Cresencia Asmus, [name]

## 2021-10-10 NOTE — Progress Notes (Deleted)
Subjective:  Patient ID: Frederick Davis, male    DOB: 1922-12-30, 87 y.o.   MRN: [id number]  Patient Care Team: Claretta Fraise, MD as PCP - General (Family Medicine) Minus Breeding, MD as PCP - Cardiology (Cardiology) Burman Foster, MD as Physician Assistant (Urology) Danella Sensing, MD as Consulting Physician (Dermatology) Gaynelle Arabian, MD as Consulting Physician (Orthopedic Surgery) Rogene Houston, MD as Consulting Physician (Gastroenterology) Shea Evans Norva Riffle, LCSW as [place] Management (Licensed Clinical Social Worker)   Chief Complaint:  No chief complaint on file.   HPI: Frederick Davis is a 87 y.o. male presenting on 10/10/2021 for No chief complaint on file.   HPI 1. New onset atrial fibrillation (HCC) ***  2. Syncope, unspecified syncope type ***  3. Multifocal pneumonia ***  4. Aortic valve calcification ***  5. Nonrheumatic aortic valve stenosis ***  6. S/P CABG x 3 ***  7. COVID-19 virus infection ***  8. Colostomy in place [facility]) ***     Relevant past medical, surgical, family, and social history reviewed and updated as indicated.  Allergies and medications reviewed and updated. Data reviewed: Chart in Epic.   Past Medical History:  Diagnosis Date   Anemia    Aortic stenosis    Moderate // Echo 05/2019: EF 60-65, asymmetric septal LVH, mildly reduced RVSF, RVSP 37.4, mod RAE, severe LAE, mod TR, mod AS (V-max 280 cm/s, mean gradient 23 mmHg, DI 0.35), aortic root 38   Arthritis    left knee    CAD (coronary artery disease)    a. RCA thrombectomy 2014. b. CABGx3 in 11/2015 (EF 45-50% in 11/2015).   Chronic renal insufficiency, stage III (moderate) (HCC)    only has 0ne kidney   Dyspnea    WITH EXERTION   Esophageal stricture YRS AGO   Hypertension    Hypothyroidism    Kidney stone 1969   MI (myocardial infarction) ([place]) 2017   Mitral regurgitation    a. mild by TEE 11/2015.   Myocardial infarction ([place])  03/12/2013   Inferior MI.  LAD mid 80% stenosis, circ mild plaque, RCA 100% with thrombectomy PTCA.  EF 65%   Osteoporosis    PC (prostate cancer) ([place]) 1987   RBBB    Rectum cancer ([place]) 1986   Renal cell cancer ([place]) 2005    Past Surgical History:  Procedure Laterality Date   CARDIAC CATHETERIZATION N/A 12/06/2015   Procedure: Left Heart Cath and Coronary Angiography;  Surgeon: Burnell Blanks, MD;  Location: [place] CV LAB;  Service: Cardiovascular;  Laterality: N/A;   COLONOSCOPY  01/08/2012   Procedure: COLONOSCOPY;  Surgeon: Rogene Houston, MD;  [facility];  Service: Endoscopy;  Laterality: N/A;  1030   COLONOSCOPY N/A 07/20/2015   Procedure: COLONOSCOPY;  Surgeon: Rogene Houston, MD;  [facility];  Service: Endoscopy;  Laterality: N/A;  930 - moved to 9/29 @ 2:00   COLOSTOMY Left 1986   colon cancer   CORONARY ARTERY BYPASS GRAFT N/A 12/08/2015   Procedure: CORONARY ARTERY BYPASS GRAFTING (CABG);  Surgeon: Melrose Nakayama, MD;  Location: [place];  Service: Open Heart Surgery;  Laterality: N/A;  Times 3 using left internal mammary artery and endoscopycally harvested bilateral saphenous vein.   HERNIA REPAIR     2007 RIGHT INGUINAL HERNIA   KIDNEY SURGERY Right 2004   LEFT HEART CATHETERIZATION WITH CORONARY ANGIOGRAM N/A 03/12/2013   Procedure: STEMI;  Surgeon: Burnell Blanks,  MD;  Location: [place] CATH LAB;  Service: Cardiovascular;  Laterality: N/A;   PERCUTANEOUS CORONARY INTERVENTION-[place] ONLY     PROSTATECTOMY     RADIOFREQUENCY ABLATION KIDNEY  2006   REMOVAL OF PENILE PROSTHESIS N/A 12/15/2017   Procedure: REMOVAL OF PENILE PROSTHESIS;  Surgeon: Franchot Gallo, MD;  [facility];  Service: Urology;  Laterality: N/A;   REMOVAL OF PENILE PROSTHESIS N/A 02/23/2018   Procedure: [name] OF ERODED SPHINCTER;  Surgeon: Franchot Gallo, MD;  Location[facility];  Service: Urology;  Laterality: N/A;   renal sphincter appliance  2007    TEE WITHOUT CARDIOVERSION N/A 12/08/2015   Procedure: TRANSESOPHAGEAL ECHOCARDIOGRAM (TEE);  Surgeon: Melrose Nakayama, MD;  Location: [place];  Service: Open Heart Surgery;  Laterality: N/A;    Social History   Socioeconomic History   Marital status: Married    Spouse name: Mikle Bosworth   Number of children: 4   Years of education: 18   Highest education level: Professional school degree (e.g., MD, DDS, DVM, JD)  Occupational History   Occupation: pharmacist-retired   Occupation: preacher-retired  Tobacco Use   Smoking status: Former    Packs/day: 0.50    Years: 10.00    Pack years: 5.00    Types: Cigarettes    Start date: 10/21/1946    Quit date: 10/21/1968    Years since quitting: 53.0   Smokeless tobacco: Never  Vaping Use   Vaping Use: Never used  Substance and Sexual Activity   Alcohol use: Yes    Alcohol/week: 1.0 standard drink    Types: 1 Standard drinks or equivalent per week    Comment: RARE   Drug use: No   Sexual activity: Not Currently  Other Topics Concern   Not on file  Social History Narrative   Lives home with wife. One child in [place], others live in [place] Resource Strain: Low Risk    Difficulty of Paying Living Expenses: Not hard at all  Food Insecurity: No Food Insecurity   Worried About Charity fundraiser in the Last Year: Never true   Arboriculturist in the Last Year: Never true  Transportation Needs: No Transportation Needs   Lack of Transportation (Medical): No   Lack of Transportation (Non-Medical): No  Physical Activity: Inactive   Days of Exercise per Week: 0 days   Minutes of Exercise per Session: 0 min  Stress: Stress Concern Present   Feeling of Stress : To some extent  Social Connections: Engineer, building services of Communication with Friends and Family: More than three times a week   Frequency of Social Gatherings with Friends and Family: More than three times a week    Attends Religious Services: More than 4 times per year   Active Member of [employer] or Organizations: Yes   Attends Music therapist: More than 4 times per year   Marital Status: Married  Human resources officer Violence: Not At Risk   Fear of Current or Ex-Partner: No   Emotionally Abused: No   Physically Abused: No   Sexually Abused: No    Outpatient Encounter Medications as of 10/10/2021  Medication Sig   amoxicillin-clavulanate (AUGMENTIN) 875-125 MG tablet Take 1 tablet by mouth 2 (two) times daily for 5 days.   apixaban (ELIQUIS) 2.5 MG TABS tablet Take 1 tablet (2.5 mg total) by mouth 2 (two) times daily.   benzonatate (TESSALON PERLES) 100 MG capsule Take 1  capsule (100 mg total) by mouth 3 (three) times daily as needed.   carvedilol (COREG) 3.125 MG tablet TAKE 1 TABLET TWICE A DAY WITH MEALS (BREAKFAST AND SUPPER)   furosemide (LASIX) 20 MG tablet TAKE 1 TABLET BY MOUTH DAILY AS NEEDED.   guaiFENesin-codeine (CHERATUSSIN AC) 100-10 MG/5ML syrup Take 5 mLs by mouth every 4 (four) hours as needed for cough.   levothyroxine (SYNTHROID) 75 MCG tablet Take 1 tablet (75 mcg total) by mouth daily.   lipase/protease/amylase (CREON) 36000 UNITS CPEP capsule Take 1 capsule by mouth at the first bite of each meal, and 1 capsule during meal, and 1 capsule at the last bite of a meal.  Allow for 3 meals per day.  If eating snacks take 1 capsule with snack.   meclizine (ANTIVERT) 25 MG tablet Take 25 mg by mouth 3 (three) times daily as needed for dizziness.   Multiple Vitamin (MULTIVITAMIN) capsule Take 1 capsule daily by mouth.   Multiple Vitamins-Minerals (ZINC PO) Take by mouth at bedtime.   nitroGLYCERIN (NITROSTAT) 0.4 MG SL tablet PLACE 1 TABLET UNDER THE TONGUE AT ONSET OF CHEST PAIN EVERY 5 MINTUES UP TO 3 TIMES AS NEEDED FOR CHEST PAIN   Facility-Administered Encounter Medications as of 10/10/2021  Medication   etomidate (AMIDATE) injection   lidocaine (cardiac) 100 mg/12ml  (XYLOCAINE) 20 MG/ML injection 2%   succinylcholine (ANECTINE) injection    Allergies  Allergen Reactions   Lipitor [Atorvastatin] Other (See Comments)    Muscle aches    Review of Systems      Objective:  There were no vitals taken for this visit.   Wt Readings from Last 3 Encounters:  10/07/21 141 lb 5 oz (64.1 kg)  10/03/21 140 lb 9.6 oz (63.8 kg)  09/26/21 143 lb (64.9 kg)    Physical Exam  Results for orders placed or performed during the hospital encounter of 10/07/21  Blood culture (routine x 2)   Specimen: BLOOD LEFT WRIST  Result Value Ref Range   Specimen Description BLOOD LEFT WRIST BOTTLES DRAWN AEROBIC ONLY    Special Requests Blood Culture adequate volume    Culture      NO GROWTH 3 DAYS Performed at [facility], [address] [place], [place] [address]    Report Status PENDING   Blood culture (routine x 2)   Specimen: BLOOD LEFT ARM  Result Value Ref Range   Specimen Description BLOOD LEFT ARM BOTTLES DRAWN AEROBIC AND ANAEROBIC    Special Requests Blood Culture adequate volume    Culture      NO GROWTH 3 DAYS Performed at [facility], [address], [place], [place] [address]    Report Status PENDING   Comprehensive metabolic panel  Result Value Ref Range   Sodium 133 (L) 135 - 145 mmol/L   Potassium 4.3 3.5 - 5.1 mmol/L   Chloride 102 98 - 111 mmol/L   CO2 24 22 - 32 mmol/L   Glucose, Bld 92 70 - 99 mg/dL   BUN 35 (H) 8 - 23 mg/dL   Creatinine, Ser 1.28 (H) 0.61 - 1.24 mg/dL   Calcium 8.9 8.9 - 10.3 mg/dL   Total Protein 5.4 (L) 6.5 - 8.1 g/dL   Albumin 2.7 (L) 3.5 - 5.0 g/dL   AST 22 15 - 41 U/L   ALT 13 0 - 44 U/L   Alkaline Phosphatase 63 38 - 126 U/L   Total Bilirubin 0.8 0.3 - 1.2 mg/dL   GFR, Estimated 51 (  L) >60 mL/min   Anion gap 7 5 - 15  CBC with Differential  Result Value Ref Range   WBC 6.9 4.0 - 10.5 K/uL   RBC 4.16 (L) 4.22 - 5.81 MIL/uL   Hemoglobin 11.6 (L) 13.0 - 17.0 g/dL   HCT 36.5 (L) 39.0 - 52.0 %   MCV  87.7 80.0 - 100.0 fL   MCH 27.9 26.0 - 34.0 pg   MCHC 31.8 30.0 - 36.0 g/dL   RDW 14.5 11.5 - 15.5 %   Platelets 248 150 - 400 K/uL   nRBC 0.0 0.0 - 0.2 %   Neutrophils Relative % 74 %   Neutro Abs 5.1 1.7 - 7.7 K/uL   Lymphocytes Relative 15 %   Lymphs Abs 1.1 0.7 - 4.0 K/uL   Monocytes Relative 10 %   Monocytes Absolute 0.7 0.1 - 1.0 K/uL   Eosinophils Relative 1 %   Eosinophils Absolute 0.1 0.0 - 0.5 K/uL   Basophils Relative 0 %   Basophils Absolute 0.0 0.0 - 0.1 K/uL   Immature Granulocytes 0 %   Abs Immature Granulocytes 0.03 0.00 - 0.07 K/uL  Urinalysis, Routine w reflex microscopic Urine, Clean Catch  Result Value Ref Range   Color, Urine YELLOW YELLOW   APPearance CLEAR CLEAR   Specific Gravity, Urine 1.017 1.005 - 1.030   pH 5.0 5.0 - 8.0   Glucose, UA NEGATIVE NEGATIVE mg/dL   Hgb urine dipstick NEGATIVE NEGATIVE   Bilirubin Urine NEGATIVE NEGATIVE   Ketones, ur NEGATIVE NEGATIVE mg/dL   Protein, ur 30 (A) NEGATIVE mg/dL   Nitrite NEGATIVE NEGATIVE   Leukocytes,Ua NEGATIVE NEGATIVE   RBC / HPF 0-5 0 - 5 RBC/hpf   WBC, UA 0-5 0 - 5 WBC/hpf   Bacteria, UA NONE SEEN NONE SEEN   Squamous Epithelial / LPF 0-5 0 - 5   Mucus PRESENT   Lactic acid, plasma  Result Value Ref Range   Lactic Acid, Venous 1.2 0.5 - 1.9 mmol/L  Lactic acid, plasma  Result Value Ref Range   Lactic Acid, Venous 2.1 (HH) 0.5 - 1.9 mmol/L  Procalcitonin - Baseline  Result Value Ref Range   Procalcitonin <0.10 ng/mL  C-reactive protein  Result Value Ref Range   CRP <0.5 <1.0 mg/dL  D-dimer, quantitative  Result Value Ref Range   D-Dimer, Quant 1.97 (H) 0.00 - 0.50 ug/mL-FEU  Lactate dehydrogenase  Result Value Ref Range   LDH 138 98 - 192 U/L  Ferritin  Result Value Ref Range   Ferritin 226 24 - 336 ng/mL  TSH  Result Value Ref Range   TSH 0.915 0.350 - 4.500 uIU/mL  Magnesium  Result Value Ref Range   Magnesium 2.0 1.7 - 2.4 mg/dL  Lactic acid, plasma  Result Value Ref Range    Lactic Acid, Venous 1.4 0.5 - 1.9 mmol/L  CBC with Differential/Platelet  Result Value Ref Range   WBC 8.1 4.0 - 10.5 K/uL   RBC 3.67 (L) 4.22 - 5.81 MIL/uL   Hemoglobin 10.2 (L) 13.0 - 17.0 g/dL   HCT 32.2 (L) 39.0 - 52.0 %   MCV 87.7 80.0 - 100.0 fL   MCH 27.8 26.0 - 34.0 pg   MCHC 31.7 30.0 - 36.0 g/dL   RDW 14.3 11.5 - 15.5 %   Platelets 255 150 - 400 K/uL   nRBC 0.0 0.0 - 0.2 %   Neutrophils Relative % 74 %   Neutro Abs 6.1 1.7 - 7.7 K/uL  Lymphocytes Relative 14 %   Lymphs Abs 1.1 0.7 - 4.0 K/uL   Monocytes Relative 9 %   Monocytes Absolute 0.7 0.1 - 1.0 K/uL   Eosinophils Relative 2 %   Eosinophils Absolute 0.2 0.0 - 0.5 K/uL   Basophils Relative 0 %   Basophils Absolute 0.0 0.0 - 0.1 K/uL   Immature Granulocytes 1 %   Abs Immature Granulocytes 0.06 0.00 - 0.07 K/uL  Comprehensive metabolic panel  Result Value Ref Range   Sodium 137 135 - 145 mmol/L   Potassium 4.1 3.5 - 5.1 mmol/L   Chloride 107 98 - 111 mmol/L   CO2 23 22 - 32 mmol/L   Glucose, Bld 96 70 - 99 mg/dL   BUN 32 (H) 8 - 23 mg/dL   Creatinine, Ser 1.21 0.61 - 1.24 mg/dL   Calcium 8.7 (L) 8.9 - 10.3 mg/dL   Total Protein 5.0 (L) 6.5 - 8.1 g/dL   Albumin 2.4 (L) 3.5 - 5.0 g/dL   AST 20 15 - 41 U/L   ALT 13 0 - 44 U/L   Alkaline Phosphatase 57 38 - 126 U/L   Total Bilirubin 0.4 0.3 - 1.2 mg/dL   GFR, Estimated 55 (L) >60 mL/min   Anion gap 7 5 - 15  C-reactive protein  Result Value Ref Range   CRP <0.5 <1.0 mg/dL  D-dimer, quantitative  Result Value Ref Range   D-Dimer, Quant 2.01 (H) 0.00 - 0.50 ug/mL-FEU  Ferritin  Result Value Ref Range   Ferritin 191 24 - 336 ng/mL  CK  Result Value Ref Range   Total CK 21 (L) 49 - 397 U/L  ECHOCARDIOGRAM COMPLETE  Result Value Ref Range   Weight 2,261.04 oz   Height 64 in   BP 129/85 mmHg   AR max vel 0.96 cm2   AV Area VTI 0.92 cm2   AV Mean grad 17.3 mmHg   AV Peak grad 30.5 mmHg   Ao pk vel 2.76 m/s   AV Area mean vel 0.81 cm2   MV VTI  2.01 cm2   Area-P 1/2 4.46 cm2   S' Lateral 2.60 cm  Troponin I (High Sensitivity)  Result Value Ref Range   Troponin I (High Sensitivity) 166 (HH) <18 ng/L  Troponin I (High Sensitivity)  Result Value Ref Range   Troponin I (High Sensitivity) 150 (HH) <18 ng/L       Pertinent labs & imaging results that were available during my care of the patient were reviewed by me and considered in my medical decision making.  Assessment & Plan:  Diagnoses and all orders for this visit:  Hospital discharge follow-up  New onset atrial fibrillation ([place]) -     AMB Referral to [facility] Coordin[facility]  Syncope, unspecified syncope type -     AMB Referral to [facility] Care Coordinaton  Multifocal pneumonia -     AMB Referral to [place]  Aortic valve calcification -     AMB Referral to [place]  Nonrheumatic aortic valve stenosis -     AMB Referral to [place]  S/P CABG x 3 -     AMB Referral to [place]  COVID-19 virus infection -     AMB Referral to [place]  Colostomy in place [facility]) -     AMB Referral to [place] all other maintenance medications.  Follow up plan: No follow-ups on file.  Continue healthy lifestyle choices, including diet (rich in fruits, vegetables, and lean proteins, and low in salt and simple carbohydrates) and exercise (at least 30 minutes of moderate physical activity daily).  Educational handout given for ***  The above assessment and management plan was discussed with the patient. The patient verbalized understanding of and has agreed to the management plan. Patient is aware to call the clinic if they develop any new symptoms or if symptoms persist or worsen. Patient is aware when to return to the clinic for a follow-up visit. Patient educated on when it is appropriate to go to the emergency department.   Monia Pouch,  FNP-C [facility] [phone]

## 2021-10-10 NOTE — Progress Notes (Signed)
Virtual Visit via MyChart Video Note Due to COVID-19 pandemic this visit was conducted virtually. This visit type was conducted due to national recommendations for restrictions regarding the COVID-19 Pandemic (e.g. social distancing, sheltering in place) in an effort to limit this patient's exposure and mitigate transmission in our community. All issues noted in this document were discussed and addressed.  A physical exam was not performed with this format.   I connected with Frederick Davis on 10/10/2021 at 1145 by MyChart Video and verified that I am speaking with the correct person using two identifiers. Frederick Davis is currently located at home and  wife and son  are currently with them during visit. The provider, Monia Pouch, FNP is located in their office at time of visit.  I discussed the limitations, risks, security and privacy concerns of performing an evaluation and management service by virtual visit and the availability of in person appointments. I also discussed with the patient that there may be a patient responsible charge related to this service. The patient expressed understanding and agreed to proceed.  Subjective:  Patient ID: Frederick Davis, male    DOB: [birth year], 87 y.o.   MRN: [id number]  Chief Complaint:  No chief complaint on file.   HPI: Frederick Davis is a 87 y.o. male presenting on 10/10/2021 for No chief complaint on file.   Patient visit today is for hospital discharge follow-up/transition of care.  Video visit with patient, wife, and son.  Son reports patient was having a bowel movement and had a syncopal episode on 10/07/2021.  He was taken to the ED and found to have new onset A. fib.  Prior to admission on 09/26/2021, patient was diagnosed with COVID-19 viral illness and upper respiratory infection.  He was treated with antibiotics and Tessalon.  Son reports onset of COVID symptoms around Thanksgiving with generalized weakness, cough, congestion,  fatigue, and decreased appetite.  States cough and congestion mostly resolved but patient remained weak with loss of appetite.  Chest x-ray revealed multifocal pneumonia, EKG revealed new onset A. Fib, labs revealed slightly declined renal function, hemoglobin, and hematocrit.  Troponin and D-dimer were elevated.  CT head normal.  He was admitted and treated with IV Rocephin and Zithromax.  He was started on Eliquis 2.5 mg twice daily.  He was discharged home the next day with oral Augmentin.  He followed up with Dr. Percival Spanish who suggested possible hospice. Son states patient was very weak and unable to walk when discharged home.  States he is doing better today but still requires assistance with ambulating, bathing, and colostomy care.  Patient states he is doing okay since being home but does not have the greatest.  Wife is very hard of hearing and unable to tell when patient gets up out of bed at night.  Son and wife are afraid patient will fall and this concerned him due to Eliquis therapy.  His home is not handicap equipped.  His walker will not fit into the bathroom causing issues getting back and forth to the bathroom.  He does not have a wheelchair ramp and there are steps leading in and out of house. Wife and son would like around-the-clock assistance and feel the best option is assisted living or skilled nursing facility.  Wife would like this to be local as she wants to still be able to see him daily.    Relevant past medical, surgical, family, and social history reviewed and updated as indicated.  Allergies and medications reviewed and updated.   Past Medical History:  Diagnosis Date   Anemia    Aortic stenosis    Moderate // Echo 05/2019: EF 60-65, asymmetric septal LVH, mildly reduced RVSF, RVSP 37.4, mod RAE, severe LAE, mod TR, mod AS (V-max 280 cm/s, mean gradient 23 mmHg, DI 0.35), aortic root 38   Arthritis    left knee    CAD (coronary artery disease)    a. RCA thrombectomy 2014.  b. CABGx3 in 11/2015 (EF 45-50% in 11/2015).   Chronic renal insufficiency, stage III (moderate) (HCC)    only has 0ne kidney   Dyspnea    WITH EXERTION   Esophageal stricture YRS AGO   Hypertension    Hypothyroidism    Kidney stone 1969   MI (myocardial infarction) ([place]) 2017   Mitral regurgitation    a. mild by TEE 11/2015.   Myocardial infarction ([place]) 03/12/2013   Inferior MI.  LAD mid 80% stenosis, circ mild plaque, RCA 100% with thrombectomy PTCA.  EF 65%   Osteoporosis    PC (prostate cancer) ([place]) 1987   RBBB    Rectum cancer ([place]) 1986   Renal cell cancer ([place]) 2005    Past Surgical History:  Procedure Laterality Date   CARDIAC CATHETERIZATION N/A 12/06/2015   Procedure: Left Heart Cath and Coronary Angiography;  Surgeon: Burnell Blanks, MD;  Location: [facility];  Service: Cardiovascular;  Laterality: N/A;   COLONOSCOPY  01/08/2012   Procedure: COLONOSCOPY;  Surgeon: Rogene Houston, MD;  [facility];  Service: Endoscopy;  Laterality: N/A;  1030   COLONOSCOPY N/A 07/20/2015   Procedure: COLONOSCOPY;  Surgeon: Rogene Houston, MD;  [facility];  Service: Endoscopy;  Laterality: N/A;  930 - moved to 9/29 @ 2:00   COLOSTOMY Left 1986   colon cancer   CORONARY ARTERY BYPASS GRAFT N/A 12/08/2015   Procedure: CORONARY ARTERY BYPASS GRAFTING (CABG);  Surgeon: Melrose Nakayama, MD;  Location: [facility];  Service: Open Heart Surgery;  Laterality: N/A;  Times 3 using left internal mammary artery and endoscopycally harvested bilateral saphenous vein.   HERNIA REPAIR     2007 RIGHT INGUINAL HERNIA   KIDNEY SURGERY Right 2004   LEFT HEART CATHETERIZATION WITH CORONARY ANGIOGRAM N/A 03/12/2013   Procedure: STEMI;  Surgeon: Burnell Blanks, MD;  Location: [facility] CATH LAB;  Service: Cardiovascular;  Laterality: N/A;   PERCUTANEOUS CORONARY INTERVENTION-[place] ONLY     PROSTATECTOMY     RADIOFREQUENCY ABLATION KIDNEY  2006   REMOVAL OF PENILE  PROSTHESIS N/A 12/15/2017   Procedure: REMOVAL OF PENILE PROSTHESIS;  Surgeon: Franchot Gallo, MD;  Location: [facility];  Service: Urology;  Laterality: N/A;   REMOVAL OF PENILE PROSTHESIS N/A 02/23/2018   Procedure: [name] OF ERODED SPHINCTER;  Surgeon: Franchot Gallo, MD;  Location: [facility];  Service: Urology;  Laterality: N/A;   renal sphincter appliance  2007   TEE WITHOUT CARDIOVERSION N/A 12/08/2015   Procedure: TRANSESOPHAGEAL ECHOCARDIOGRAM (TEE);  Surgeon: Melrose Nakayama, MD;  Location: [facility];  Service: Open Heart Surgery;  Laterality: N/A;    Social History   Socioeconomic History   Marital status: Married    Spouse name: Mikle Bosworth   Number of children: 4   Years of education: 18   Highest education level: Professional school degree (e.g., MD, DDS, DVM, JD)  Occupational History   Occupation: pharmacist-retired   Occupation: preacher-retired  Tobacco Use   Smoking status: Former  Packs/day: 0.50    Years: 10.00    Pack years: 5.00    Types: Cigarettes    Start date: 10/21/1946    Quit date: 10/21/1968    Years since quitting: 53.0   Smokeless tobacco: Never  Vaping Use   Vaping Use: Never used  Substance and Sexual Activity   Alcohol use: Yes    Alcohol/week: 1.0 standard drink    Types: 1 Standard drinks or equivalent per week    Comment: RARE   Drug use: No   Sexual activity: Not Currently  Other Topics Concern   Not on file  Social History Narrative   Lives home with wife. One child in [place], others live in [place] Resource Strain: Low Risk    Difficulty of Paying Living Expenses: Not hard at all  Food Insecurity: No Food Insecurity   Worried About Charity fundraiser in the Last Year: Never true   Arboriculturist in the Last Year: Never true  Transportation Needs: No Transportation Needs   Lack of Transportation (Medical): No   Lack of Transportation (Non-Medical): No  Physical Activity:  Inactive   Days of Exercise per Week: 0 days   Minutes of Exercise per Session: 0 min  Stress: Stress Concern Present   Feeling of Stress : To some extent  Social Connections: Engineer, building services of Communication with Friends and Family: More than three times a week   Frequency of Social Gatherings with Friends and Family: More than three times a week   Attends Religious Services: More than 4 times per year   Active Member of [employer] or Organizations: Yes   Attends Music therapist: More than 4 times per year   Marital Status: Married  Human resources officer Violence: Not At Risk   Fear of Current or Ex-Partner: No   Emotionally Abused: No   Physically Abused: No   Sexually Abused: No    Outpatient Encounter Medications as of 10/10/2021  Medication Sig   amoxicillin-clavulanate (AUGMENTIN) 875-125 MG tablet Take 1 tablet by mouth 2 (two) times daily for 5 days.   apixaban (ELIQUIS) 2.5 MG TABS tablet Take 1 tablet (2.5 mg total) by mouth 2 (two) times daily.   benzonatate (TESSALON PERLES) 100 MG capsule Take 1 capsule (100 mg total) by mouth 3 (three) times daily as needed.   carvedilol (COREG) 3.125 MG tablet TAKE 1 TABLET TWICE A DAY WITH MEALS (BREAKFAST AND SUPPER)   furosemide (LASIX) 20 MG tablet TAKE 1 TABLET BY MOUTH DAILY AS NEEDED.   guaiFENesin-codeine (CHERATUSSIN AC) 100-10 MG/5ML syrup Take 5 mLs by mouth every 4 (four) hours as needed for cough.   levothyroxine (SYNTHROID) 75 MCG tablet Take 1 tablet (75 mcg total) by mouth daily.   lipase/protease/amylase (CREON) 36000 UNITS CPEP capsule Take 1 capsule by mouth at the first bite of each meal, and 1 capsule during meal, and 1 capsule at the last bite of a meal.  Allow for 3 meals per day.  If eating snacks take 1 capsule with snack.   meclizine (ANTIVERT) 25 MG tablet Take 25 mg by mouth 3 (three) times daily as needed for dizziness.   Multiple Vitamin (MULTIVITAMIN) capsule Take 1 capsule daily by  mouth.   Multiple Vitamins-Minerals (ZINC PO) Take by mouth at bedtime.   nitroGLYCERIN (NITROSTAT) 0.4 MG SL tablet PLACE 1 TABLET UNDER THE TONGUE AT ONSET OF CHEST PAIN EVERY 5  MINTUES UP TO 3 TIMES AS NEEDED FOR CHEST PAIN   Facility-Administered Encounter Medications as of 10/10/2021  Medication   etomidate (AMIDATE) injection   lidocaine (cardiac) 100 mg/63ml (XYLOCAINE) 20 MG/ML injection 2%   succinylcholine (ANECTINE) injection    Allergies  Allergen Reactions   Lipitor [Atorvastatin] Other (See Comments)    Muscle aches    Review of Systems  Constitutional:  Positive for activity change, appetite change and fatigue. Negative for chills, diaphoresis, fever and unexpected weight change.  Respiratory:  Negative for cough, chest tightness and shortness of breath.   Cardiovascular:  Negative for chest pain, palpitations and leg swelling.  Gastrointestinal:        Colostomy  Genitourinary:  Negative for decreased urine volume.  Musculoskeletal:  Positive for gait problem (Uses walker).  Neurological:  Positive for weakness (Generalized). Negative for dizziness, tremors, seizures, syncope, facial asymmetry, speech difficulty, light-headedness, numbness and headaches.  Psychiatric/Behavioral:  Positive for confusion (Intermittent).   All other systems reviewed and are negative.       Observations/Objective: No vital signs or physical exam, this was a virtual health encounter.  Pt alert and oriented, answers all questions appropriately, and able to speak in full sentences.    Assessment and Plan: Diagnoses and all orders for this visit:  Hospital discharge follow-up Today's visit was for Transitional Care Management. The patient was discharged from [facility] on 10[facility]08/2021 with a primary diagnosis of multifocal CAP, new onset A-Fib, syncope, aortic valve stenosis, S/P CABG, COVID-19 infection.  Contact with the patient and/or caregiver, by a clinical staff member, was made  on 10/09/2021 and was documented as a telephone encounter within the EMR. Through chart review and discussion with the patient I have determined that management of their condition is of high complexity.   New onset atrial fibrillation (HCC) Syncope, unspecified syncope type Multifocal pneumonia, CAP Aortic valve calcification Nonrheumatic aortic valve stenosis S/P CABG x 3 COVID-19 virus infection Colostomy in place ([place]) FL2 form completed today as patient family is unable to provide needed care at home and is requesting assistance with getting patient placed in the assisted living or skilled nursing facility.  Patient will likely require skilled nursing facility as he has a colostomy which requires care, new onset A. fib, generalized weakness, ans an abnormal gait requiring use of assistive devices.  Patient's wife is hard of hearing and unable Patient gets up out of the bed, she is concerned he will fall and hit his head causing of possible bleed while on Eliquis.  Cardiology has suggested hospice, patient and family prefer assisted living or skilled nursing facility placement. -     AMB Referral to [place]     Follow Up Instructions: No follow-ups on file.    I discussed the assessment and treatment plan with the patient. The patient was provided an opportunity to ask questions and all were answered. The patient agreed with the plan and demonstrated an understanding of the instructions.   The patient was advised to call back or seek an in-person evaluation if the symptoms worsen or if the condition fails to improve as anticipated.  The above assessment and management plan was discussed with the patient. The patient verbalized understanding of and has agreed to the management plan. Patient is aware to call the clinic if they develop any new symptoms or if symptoms persist or worsen. Patient is aware when to return to the clinic for a follow-up visit. Patient educated  on when it  is appropriate to go to the emergency department.    I provided 40 minutes of time during this MyChart Video encounter.   Monia Pouch, FNP-C [facility] [address] [place], [place] [address] [phone] 10/10/2021

## 2021-10-10 NOTE — Telephone Encounter (Signed)
Frederick Davis did TOC with pt this morning. In process of getting FL2  and placement in SNF. Theadore Nan, LSW is also involved

## 2021-10-11 ENCOUNTER — Telehealth: Payer: Self-pay | Admitting: Family Medicine

## 2021-10-11 ENCOUNTER — Telehealth: Payer: Self-pay

## 2021-10-11 DIAGNOSIS — Z87891 Personal history of nicotine dependence: Secondary | ICD-10-CM | POA: Diagnosis not present

## 2021-10-11 DIAGNOSIS — I4510 Unspecified right bundle-branch block: Secondary | ICD-10-CM | POA: Diagnosis not present

## 2021-10-11 DIAGNOSIS — D631 Anemia in chronic kidney disease: Secondary | ICD-10-CM | POA: Diagnosis not present

## 2021-10-11 DIAGNOSIS — K222 Esophageal obstruction: Secondary | ICD-10-CM | POA: Diagnosis not present

## 2021-10-11 DIAGNOSIS — Z85048 Personal history of other malignant neoplasm of rectum, rectosigmoid junction, and anus: Secondary | ICD-10-CM | POA: Diagnosis not present

## 2021-10-11 DIAGNOSIS — Z905 Acquired absence of kidney: Secondary | ICD-10-CM | POA: Diagnosis not present

## 2021-10-11 DIAGNOSIS — M1712 Unilateral primary osteoarthritis, left knee: Secondary | ICD-10-CM | POA: Diagnosis not present

## 2021-10-11 DIAGNOSIS — I252 Old myocardial infarction: Secondary | ICD-10-CM | POA: Diagnosis not present

## 2021-10-11 DIAGNOSIS — Z933 Colostomy status: Secondary | ICD-10-CM | POA: Diagnosis not present

## 2021-10-11 DIAGNOSIS — I2510 Atherosclerotic heart disease of native coronary artery without angina pectoris: Secondary | ICD-10-CM | POA: Diagnosis not present

## 2021-10-11 DIAGNOSIS — E039 Hypothyroidism, unspecified: Secondary | ICD-10-CM | POA: Diagnosis not present

## 2021-10-11 DIAGNOSIS — Z7901 Long term (current) use of anticoagulants: Secondary | ICD-10-CM | POA: Diagnosis not present

## 2021-10-11 DIAGNOSIS — Z9079 Acquired absence of other genital organ(s): Secondary | ICD-10-CM | POA: Diagnosis not present

## 2021-10-11 DIAGNOSIS — N183 Chronic kidney disease, stage 3 unspecified: Secondary | ICD-10-CM | POA: Diagnosis not present

## 2021-10-11 DIAGNOSIS — Z8546 Personal history of malignant neoplasm of prostate: Secondary | ICD-10-CM | POA: Diagnosis not present

## 2021-10-11 DIAGNOSIS — Z9181 History of falling: Secondary | ICD-10-CM | POA: Diagnosis not present

## 2021-10-11 DIAGNOSIS — I129 Hypertensive chronic kidney disease with stage 1 through stage 4 chronic kidney disease, or unspecified chronic kidney disease: Secondary | ICD-10-CM | POA: Diagnosis not present

## 2021-10-11 DIAGNOSIS — Z951 Presence of aortocoronary bypass graft: Secondary | ICD-10-CM | POA: Diagnosis not present

## 2021-10-11 DIAGNOSIS — J1282 Pneumonia due to coronavirus disease 2019: Secondary | ICD-10-CM | POA: Diagnosis not present

## 2021-10-11 DIAGNOSIS — I350 Nonrheumatic aortic (valve) stenosis: Secondary | ICD-10-CM | POA: Diagnosis not present

## 2021-10-11 DIAGNOSIS — I4891 Unspecified atrial fibrillation: Secondary | ICD-10-CM | POA: Diagnosis not present

## 2021-10-11 DIAGNOSIS — K8689 Other specified diseases of pancreas: Secondary | ICD-10-CM | POA: Diagnosis not present

## 2021-10-11 DIAGNOSIS — U071 COVID-19: Secondary | ICD-10-CM | POA: Diagnosis not present

## 2021-10-11 DIAGNOSIS — Z8553 Personal history of malignant neoplasm of renal pelvis: Secondary | ICD-10-CM | POA: Diagnosis not present

## 2021-10-11 DIAGNOSIS — M810 Age-related osteoporosis without current pathological fracture: Secondary | ICD-10-CM | POA: Diagnosis not present

## 2021-10-11 NOTE — Telephone Encounter (Signed)
Talked with him about placement options, that I have a list of private sitters as well. He is going to talk with the rest of the family, call [facility] see if they have an opening then call us back. FL2 has been completed and is ready to be faxed when they have found or decide to place him somewhere.

## 2021-10-11 NOTE — Progress Notes (Signed)
Error

## 2021-10-12 LAB — CULTURE, BLOOD (ROUTINE X 2)
Culture: NO GROWTH
Culture: NO GROWTH
Special Requests: ADEQUATE
Special Requests: ADEQUATE

## 2021-10-15 DIAGNOSIS — I129 Hypertensive chronic kidney disease with stage 1 through stage 4 chronic kidney disease, or unspecified chronic kidney disease: Secondary | ICD-10-CM | POA: Diagnosis not present

## 2021-10-15 DIAGNOSIS — I2510 Atherosclerotic heart disease of native coronary artery without angina pectoris: Secondary | ICD-10-CM | POA: Diagnosis not present

## 2021-10-15 DIAGNOSIS — I350 Nonrheumatic aortic (valve) stenosis: Secondary | ICD-10-CM | POA: Diagnosis not present

## 2021-10-15 DIAGNOSIS — I4891 Unspecified atrial fibrillation: Secondary | ICD-10-CM | POA: Diagnosis not present

## 2021-10-15 DIAGNOSIS — U071 COVID-19: Secondary | ICD-10-CM | POA: Diagnosis not present

## 2021-10-15 DIAGNOSIS — J1282 Pneumonia due to coronavirus disease 2019: Secondary | ICD-10-CM | POA: Diagnosis not present

## 2021-10-16 DIAGNOSIS — I4891 Unspecified atrial fibrillation: Secondary | ICD-10-CM | POA: Diagnosis not present

## 2021-10-16 DIAGNOSIS — I350 Nonrheumatic aortic (valve) stenosis: Secondary | ICD-10-CM | POA: Diagnosis not present

## 2021-10-16 DIAGNOSIS — J1282 Pneumonia due to coronavirus disease 2019: Secondary | ICD-10-CM | POA: Diagnosis not present

## 2021-10-16 DIAGNOSIS — I2510 Atherosclerotic heart disease of native coronary artery without angina pectoris: Secondary | ICD-10-CM | POA: Diagnosis not present

## 2021-10-16 DIAGNOSIS — U071 COVID-19: Secondary | ICD-10-CM | POA: Diagnosis not present

## 2021-10-16 DIAGNOSIS — I129 Hypertensive chronic kidney disease with stage 1 through stage 4 chronic kidney disease, or unspecified chronic kidney disease: Secondary | ICD-10-CM | POA: Diagnosis not present

## 2021-10-17 DIAGNOSIS — I4891 Unspecified atrial fibrillation: Secondary | ICD-10-CM | POA: Diagnosis not present

## 2021-10-17 DIAGNOSIS — J1282 Pneumonia due to coronavirus disease 2019: Secondary | ICD-10-CM | POA: Diagnosis not present

## 2021-10-17 DIAGNOSIS — U071 COVID-19: Secondary | ICD-10-CM | POA: Diagnosis not present

## 2021-10-17 DIAGNOSIS — I350 Nonrheumatic aortic (valve) stenosis: Secondary | ICD-10-CM | POA: Diagnosis not present

## 2021-10-17 DIAGNOSIS — I2510 Atherosclerotic heart disease of native coronary artery without angina pectoris: Secondary | ICD-10-CM | POA: Diagnosis not present

## 2021-10-17 DIAGNOSIS — I129 Hypertensive chronic kidney disease with stage 1 through stage 4 chronic kidney disease, or unspecified chronic kidney disease: Secondary | ICD-10-CM | POA: Diagnosis not present

## 2021-10-22 DIAGNOSIS — I4891 Unspecified atrial fibrillation: Secondary | ICD-10-CM | POA: Diagnosis not present

## 2021-10-22 DIAGNOSIS — I129 Hypertensive chronic kidney disease with stage 1 through stage 4 chronic kidney disease, or unspecified chronic kidney disease: Secondary | ICD-10-CM | POA: Diagnosis not present

## 2021-10-22 DIAGNOSIS — I2510 Atherosclerotic heart disease of native coronary artery without angina pectoris: Secondary | ICD-10-CM | POA: Diagnosis not present

## 2021-10-22 DIAGNOSIS — U071 COVID-19: Secondary | ICD-10-CM | POA: Diagnosis not present

## 2021-10-22 DIAGNOSIS — I350 Nonrheumatic aortic (valve) stenosis: Secondary | ICD-10-CM | POA: Diagnosis not present

## 2021-10-22 DIAGNOSIS — J1282 Pneumonia due to coronavirus disease 2019: Secondary | ICD-10-CM | POA: Diagnosis not present

## 2021-10-23 DIAGNOSIS — I129 Hypertensive chronic kidney disease with stage 1 through stage 4 chronic kidney disease, or unspecified chronic kidney disease: Secondary | ICD-10-CM | POA: Diagnosis not present

## 2021-10-23 DIAGNOSIS — I4891 Unspecified atrial fibrillation: Secondary | ICD-10-CM | POA: Diagnosis not present

## 2021-10-23 DIAGNOSIS — I2510 Atherosclerotic heart disease of native coronary artery without angina pectoris: Secondary | ICD-10-CM | POA: Diagnosis not present

## 2021-10-23 DIAGNOSIS — I350 Nonrheumatic aortic (valve) stenosis: Secondary | ICD-10-CM | POA: Diagnosis not present

## 2021-10-23 DIAGNOSIS — J1282 Pneumonia due to coronavirus disease 2019: Secondary | ICD-10-CM | POA: Diagnosis not present

## 2021-10-23 DIAGNOSIS — U071 COVID-19: Secondary | ICD-10-CM | POA: Diagnosis not present

## 2021-10-24 DIAGNOSIS — I129 Hypertensive chronic kidney disease with stage 1 through stage 4 chronic kidney disease, or unspecified chronic kidney disease: Secondary | ICD-10-CM | POA: Diagnosis not present

## 2021-10-24 DIAGNOSIS — I350 Nonrheumatic aortic (valve) stenosis: Secondary | ICD-10-CM | POA: Diagnosis not present

## 2021-10-24 DIAGNOSIS — U071 COVID-19: Secondary | ICD-10-CM | POA: Diagnosis not present

## 2021-10-24 DIAGNOSIS — J1282 Pneumonia due to coronavirus disease 2019: Secondary | ICD-10-CM | POA: Diagnosis not present

## 2021-10-24 DIAGNOSIS — I2510 Atherosclerotic heart disease of native coronary artery without angina pectoris: Secondary | ICD-10-CM | POA: Diagnosis not present

## 2021-10-24 DIAGNOSIS — I4891 Unspecified atrial fibrillation: Secondary | ICD-10-CM | POA: Diagnosis not present

## 2021-10-25 ENCOUNTER — Ambulatory Visit (INDEPENDENT_AMBULATORY_CARE_PROVIDER_SITE_OTHER): Payer: Medicare Other

## 2021-10-25 ENCOUNTER — Other Ambulatory Visit: Payer: Self-pay

## 2021-10-25 DIAGNOSIS — K222 Esophageal obstruction: Secondary | ICD-10-CM

## 2021-10-25 DIAGNOSIS — Z8616 Personal history of COVID-19: Secondary | ICD-10-CM | POA: Diagnosis not present

## 2021-10-25 DIAGNOSIS — E039 Hypothyroidism, unspecified: Secondary | ICD-10-CM | POA: Diagnosis not present

## 2021-10-25 DIAGNOSIS — K8689 Other specified diseases of pancreas: Secondary | ICD-10-CM

## 2021-10-25 DIAGNOSIS — I4510 Unspecified right bundle-branch block: Secondary | ICD-10-CM

## 2021-10-25 DIAGNOSIS — J1282 Pneumonia due to coronavirus disease 2019: Secondary | ICD-10-CM | POA: Diagnosis not present

## 2021-10-25 DIAGNOSIS — I4891 Unspecified atrial fibrillation: Secondary | ICD-10-CM

## 2021-10-25 DIAGNOSIS — M1712 Unilateral primary osteoarthritis, left knee: Secondary | ICD-10-CM

## 2021-10-25 DIAGNOSIS — D631 Anemia in chronic kidney disease: Secondary | ICD-10-CM

## 2021-10-25 DIAGNOSIS — I129 Hypertensive chronic kidney disease with stage 1 through stage 4 chronic kidney disease, or unspecified chronic kidney disease: Secondary | ICD-10-CM

## 2021-10-25 DIAGNOSIS — I350 Nonrheumatic aortic (valve) stenosis: Secondary | ICD-10-CM

## 2021-10-25 DIAGNOSIS — M810 Age-related osteoporosis without current pathological fracture: Secondary | ICD-10-CM

## 2021-10-25 DIAGNOSIS — N183 Chronic kidney disease, stage 3 unspecified: Secondary | ICD-10-CM | POA: Diagnosis not present

## 2021-10-25 DIAGNOSIS — Z9181 History of falling: Secondary | ICD-10-CM

## 2021-10-25 DIAGNOSIS — Z7901 Long term (current) use of anticoagulants: Secondary | ICD-10-CM

## 2021-10-25 DIAGNOSIS — I2510 Atherosclerotic heart disease of native coronary artery without angina pectoris: Secondary | ICD-10-CM

## 2021-10-25 DIAGNOSIS — I252 Old myocardial infarction: Secondary | ICD-10-CM

## 2021-10-29 ENCOUNTER — Other Ambulatory Visit: Payer: Self-pay | Admitting: Family Medicine

## 2021-10-29 ENCOUNTER — Other Ambulatory Visit: Payer: Self-pay | Admitting: Physician Assistant

## 2021-10-29 DIAGNOSIS — I350 Nonrheumatic aortic (valve) stenosis: Secondary | ICD-10-CM | POA: Diagnosis not present

## 2021-10-29 DIAGNOSIS — U071 COVID-19: Secondary | ICD-10-CM | POA: Diagnosis not present

## 2021-10-29 DIAGNOSIS — I4891 Unspecified atrial fibrillation: Secondary | ICD-10-CM | POA: Diagnosis not present

## 2021-10-29 DIAGNOSIS — I2510 Atherosclerotic heart disease of native coronary artery without angina pectoris: Secondary | ICD-10-CM

## 2021-10-29 DIAGNOSIS — J1282 Pneumonia due to coronavirus disease 2019: Secondary | ICD-10-CM | POA: Diagnosis not present

## 2021-10-29 DIAGNOSIS — I129 Hypertensive chronic kidney disease with stage 1 through stage 4 chronic kidney disease, or unspecified chronic kidney disease: Secondary | ICD-10-CM | POA: Diagnosis not present

## 2021-10-30 DIAGNOSIS — I2510 Atherosclerotic heart disease of native coronary artery without angina pectoris: Secondary | ICD-10-CM | POA: Diagnosis not present

## 2021-10-30 DIAGNOSIS — J1282 Pneumonia due to coronavirus disease 2019: Secondary | ICD-10-CM | POA: Diagnosis not present

## 2021-10-30 DIAGNOSIS — U071 COVID-19: Secondary | ICD-10-CM | POA: Diagnosis not present

## 2021-10-30 DIAGNOSIS — I350 Nonrheumatic aortic (valve) stenosis: Secondary | ICD-10-CM | POA: Diagnosis not present

## 2021-10-30 DIAGNOSIS — I4891 Unspecified atrial fibrillation: Secondary | ICD-10-CM | POA: Diagnosis not present

## 2021-10-30 DIAGNOSIS — I129 Hypertensive chronic kidney disease with stage 1 through stage 4 chronic kidney disease, or unspecified chronic kidney disease: Secondary | ICD-10-CM | POA: Diagnosis not present

## 2021-10-31 DIAGNOSIS — U071 COVID-19: Secondary | ICD-10-CM | POA: Diagnosis not present

## 2021-10-31 DIAGNOSIS — I2510 Atherosclerotic heart disease of native coronary artery without angina pectoris: Secondary | ICD-10-CM | POA: Diagnosis not present

## 2021-10-31 DIAGNOSIS — I129 Hypertensive chronic kidney disease with stage 1 through stage 4 chronic kidney disease, or unspecified chronic kidney disease: Secondary | ICD-10-CM | POA: Diagnosis not present

## 2021-10-31 DIAGNOSIS — I350 Nonrheumatic aortic (valve) stenosis: Secondary | ICD-10-CM | POA: Diagnosis not present

## 2021-10-31 DIAGNOSIS — I4891 Unspecified atrial fibrillation: Secondary | ICD-10-CM | POA: Diagnosis not present

## 2021-10-31 DIAGNOSIS — J1282 Pneumonia due to coronavirus disease 2019: Secondary | ICD-10-CM | POA: Diagnosis not present

## 2021-11-06 DIAGNOSIS — I129 Hypertensive chronic kidney disease with stage 1 through stage 4 chronic kidney disease, or unspecified chronic kidney disease: Secondary | ICD-10-CM | POA: Diagnosis not present

## 2021-11-06 DIAGNOSIS — J1282 Pneumonia due to coronavirus disease 2019: Secondary | ICD-10-CM | POA: Diagnosis not present

## 2021-11-06 DIAGNOSIS — I350 Nonrheumatic aortic (valve) stenosis: Secondary | ICD-10-CM | POA: Diagnosis not present

## 2021-11-06 DIAGNOSIS — U071 COVID-19: Secondary | ICD-10-CM | POA: Diagnosis not present

## 2021-11-06 DIAGNOSIS — I4891 Unspecified atrial fibrillation: Secondary | ICD-10-CM | POA: Diagnosis not present

## 2021-11-06 DIAGNOSIS — I2510 Atherosclerotic heart disease of native coronary artery without angina pectoris: Secondary | ICD-10-CM | POA: Diagnosis not present

## 2021-11-07 DIAGNOSIS — I4891 Unspecified atrial fibrillation: Secondary | ICD-10-CM | POA: Diagnosis not present

## 2021-11-07 DIAGNOSIS — I129 Hypertensive chronic kidney disease with stage 1 through stage 4 chronic kidney disease, or unspecified chronic kidney disease: Secondary | ICD-10-CM | POA: Diagnosis not present

## 2021-11-07 DIAGNOSIS — I350 Nonrheumatic aortic (valve) stenosis: Secondary | ICD-10-CM | POA: Diagnosis not present

## 2021-11-07 DIAGNOSIS — J1282 Pneumonia due to coronavirus disease 2019: Secondary | ICD-10-CM | POA: Diagnosis not present

## 2021-11-07 DIAGNOSIS — U071 COVID-19: Secondary | ICD-10-CM | POA: Diagnosis not present

## 2021-11-07 DIAGNOSIS — I2510 Atherosclerotic heart disease of native coronary artery without angina pectoris: Secondary | ICD-10-CM | POA: Diagnosis not present

## 2021-11-10 DIAGNOSIS — Z8553 Personal history of malignant neoplasm of renal pelvis: Secondary | ICD-10-CM | POA: Diagnosis not present

## 2021-11-10 DIAGNOSIS — I2510 Atherosclerotic heart disease of native coronary artery without angina pectoris: Secondary | ICD-10-CM | POA: Diagnosis not present

## 2021-11-10 DIAGNOSIS — M1712 Unilateral primary osteoarthritis, left knee: Secondary | ICD-10-CM | POA: Diagnosis not present

## 2021-11-10 DIAGNOSIS — I4891 Unspecified atrial fibrillation: Secondary | ICD-10-CM | POA: Diagnosis not present

## 2021-11-10 DIAGNOSIS — Z7901 Long term (current) use of anticoagulants: Secondary | ICD-10-CM | POA: Diagnosis not present

## 2021-11-10 DIAGNOSIS — J1282 Pneumonia due to coronavirus disease 2019: Secondary | ICD-10-CM | POA: Diagnosis not present

## 2021-11-10 DIAGNOSIS — Z9181 History of falling: Secondary | ICD-10-CM | POA: Diagnosis not present

## 2021-11-10 DIAGNOSIS — D631 Anemia in chronic kidney disease: Secondary | ICD-10-CM | POA: Diagnosis not present

## 2021-11-10 DIAGNOSIS — Z8546 Personal history of malignant neoplasm of prostate: Secondary | ICD-10-CM | POA: Diagnosis not present

## 2021-11-10 DIAGNOSIS — K8689 Other specified diseases of pancreas: Secondary | ICD-10-CM | POA: Diagnosis not present

## 2021-11-10 DIAGNOSIS — I252 Old myocardial infarction: Secondary | ICD-10-CM | POA: Diagnosis not present

## 2021-11-10 DIAGNOSIS — I350 Nonrheumatic aortic (valve) stenosis: Secondary | ICD-10-CM | POA: Diagnosis not present

## 2021-11-10 DIAGNOSIS — I4510 Unspecified right bundle-branch block: Secondary | ICD-10-CM | POA: Diagnosis not present

## 2021-11-10 DIAGNOSIS — N183 Chronic kidney disease, stage 3 unspecified: Secondary | ICD-10-CM | POA: Diagnosis not present

## 2021-11-10 DIAGNOSIS — Z933 Colostomy status: Secondary | ICD-10-CM | POA: Diagnosis not present

## 2021-11-10 DIAGNOSIS — Z87891 Personal history of nicotine dependence: Secondary | ICD-10-CM | POA: Diagnosis not present

## 2021-11-10 DIAGNOSIS — Z9079 Acquired absence of other genital organ(s): Secondary | ICD-10-CM | POA: Diagnosis not present

## 2021-11-10 DIAGNOSIS — K222 Esophageal obstruction: Secondary | ICD-10-CM | POA: Diagnosis not present

## 2021-11-10 DIAGNOSIS — U071 COVID-19: Secondary | ICD-10-CM | POA: Diagnosis not present

## 2021-11-10 DIAGNOSIS — E039 Hypothyroidism, unspecified: Secondary | ICD-10-CM | POA: Diagnosis not present

## 2021-11-10 DIAGNOSIS — I129 Hypertensive chronic kidney disease with stage 1 through stage 4 chronic kidney disease, or unspecified chronic kidney disease: Secondary | ICD-10-CM | POA: Diagnosis not present

## 2021-11-10 DIAGNOSIS — M810 Age-related osteoporosis without current pathological fracture: Secondary | ICD-10-CM | POA: Diagnosis not present

## 2021-11-10 DIAGNOSIS — Z951 Presence of aortocoronary bypass graft: Secondary | ICD-10-CM | POA: Diagnosis not present

## 2021-11-10 DIAGNOSIS — Z905 Acquired absence of kidney: Secondary | ICD-10-CM | POA: Diagnosis not present

## 2021-11-10 DIAGNOSIS — Z85048 Personal history of other malignant neoplasm of rectum, rectosigmoid junction, and anus: Secondary | ICD-10-CM | POA: Diagnosis not present

## 2021-11-12 ENCOUNTER — Telehealth: Payer: Self-pay | Admitting: Cardiology

## 2021-11-12 ENCOUNTER — Telehealth: Payer: Self-pay

## 2021-11-12 DIAGNOSIS — J1282 Pneumonia due to coronavirus disease 2019: Secondary | ICD-10-CM | POA: Diagnosis not present

## 2021-11-12 DIAGNOSIS — L570 Actinic keratosis: Secondary | ICD-10-CM | POA: Diagnosis not present

## 2021-11-12 DIAGNOSIS — L821 Other seborrheic keratosis: Secondary | ICD-10-CM | POA: Diagnosis not present

## 2021-11-12 DIAGNOSIS — I350 Nonrheumatic aortic (valve) stenosis: Secondary | ICD-10-CM | POA: Diagnosis not present

## 2021-11-12 DIAGNOSIS — I872 Venous insufficiency (chronic) (peripheral): Secondary | ICD-10-CM | POA: Diagnosis not present

## 2021-11-12 DIAGNOSIS — I4891 Unspecified atrial fibrillation: Secondary | ICD-10-CM | POA: Diagnosis not present

## 2021-11-12 DIAGNOSIS — U071 COVID-19: Secondary | ICD-10-CM | POA: Diagnosis not present

## 2021-11-12 DIAGNOSIS — I2510 Atherosclerotic heart disease of native coronary artery without angina pectoris: Secondary | ICD-10-CM | POA: Diagnosis not present

## 2021-11-12 DIAGNOSIS — I8312 Varicose veins of left lower extremity with inflammation: Secondary | ICD-10-CM | POA: Diagnosis not present

## 2021-11-12 DIAGNOSIS — I129 Hypertensive chronic kidney disease with stage 1 through stage 4 chronic kidney disease, or unspecified chronic kidney disease: Secondary | ICD-10-CM | POA: Diagnosis not present

## 2021-11-12 DIAGNOSIS — I8311 Varicose veins of right lower extremity with inflammation: Secondary | ICD-10-CM | POA: Diagnosis not present

## 2021-11-12 DIAGNOSIS — Z85828 Personal history of other malignant neoplasm of skin: Secondary | ICD-10-CM | POA: Diagnosis not present

## 2021-11-12 DIAGNOSIS — D692 Other nonthrombocytopenic purpura: Secondary | ICD-10-CM | POA: Diagnosis not present

## 2021-11-12 NOTE — Telephone Encounter (Signed)
Called pt's wife back and let her know Dr. Newman Nickels recommendation of taking an extra dose of Lasix for the next 3 days then reaching out to this office to let us know how his swelling is. He also advised to reach out to the PCP due to history of low albumin levels. She verbalized understanding. No further questions at this time.

## 2021-11-12 NOTE — Telephone Encounter (Signed)
Called pt's wife back and let her know Dr. Newman Nickels recommendation of taking an extra dose of Lasix for the next 3 days then reaching out to this office to let us know how his swelling is. He also advised to reach out to the PCP due to history of low albumin levels. She verbalized understanding. No further questions at this time

## 2021-11-12 NOTE — Telephone Encounter (Signed)
Wife was calling in to speak with the nurse in regards to some question/concerns she has for the patient. Please advise

## 2021-11-12 NOTE — Telephone Encounter (Signed)
Called pt's wife. She explained pt got dizzy Sunday before Christmas, fell hit his head.  Started on Eliquis, discharged and could not walk.Has been taking PT at home. This morning appt with dermatologist, looks at his hands and feet was swollen. Have been propping up to help with swelling. Nurse coming again tomorrow. She states pt has been taking Lasix daily, not as needed. "I thought he was supposed to take it everyday."  Pt walking with walker due to fall. Will consult with DOD and call pt's wife back with recommendations.

## 2021-11-13 DIAGNOSIS — I2510 Atherosclerotic heart disease of native coronary artery without angina pectoris: Secondary | ICD-10-CM | POA: Diagnosis not present

## 2021-11-13 DIAGNOSIS — U071 COVID-19: Secondary | ICD-10-CM | POA: Diagnosis not present

## 2021-11-13 DIAGNOSIS — J1282 Pneumonia due to coronavirus disease 2019: Secondary | ICD-10-CM | POA: Diagnosis not present

## 2021-11-13 DIAGNOSIS — I350 Nonrheumatic aortic (valve) stenosis: Secondary | ICD-10-CM | POA: Diagnosis not present

## 2021-11-13 DIAGNOSIS — I4891 Unspecified atrial fibrillation: Secondary | ICD-10-CM | POA: Diagnosis not present

## 2021-11-13 DIAGNOSIS — I129 Hypertensive chronic kidney disease with stage 1 through stage 4 chronic kidney disease, or unspecified chronic kidney disease: Secondary | ICD-10-CM | POA: Diagnosis not present

## 2021-11-19 ENCOUNTER — Telehealth: Payer: Self-pay | Admitting: *Deleted

## 2021-11-19 DIAGNOSIS — R58 Hemorrhage, not elsewhere classified: Secondary | ICD-10-CM | POA: Diagnosis not present

## 2021-11-19 DIAGNOSIS — I129 Hypertensive chronic kidney disease with stage 1 through stage 4 chronic kidney disease, or unspecified chronic kidney disease: Secondary | ICD-10-CM | POA: Diagnosis not present

## 2021-11-19 DIAGNOSIS — I350 Nonrheumatic aortic (valve) stenosis: Secondary | ICD-10-CM | POA: Diagnosis not present

## 2021-11-19 DIAGNOSIS — U071 COVID-19: Secondary | ICD-10-CM | POA: Diagnosis not present

## 2021-11-19 DIAGNOSIS — I2510 Atherosclerotic heart disease of native coronary artery without angina pectoris: Secondary | ICD-10-CM | POA: Diagnosis not present

## 2021-11-19 DIAGNOSIS — I4891 Unspecified atrial fibrillation: Secondary | ICD-10-CM | POA: Diagnosis not present

## 2021-11-19 DIAGNOSIS — J1282 Pneumonia due to coronavirus disease 2019: Secondary | ICD-10-CM | POA: Diagnosis not present

## 2021-11-19 DIAGNOSIS — W19XXXA Unspecified fall, initial encounter: Secondary | ICD-10-CM | POA: Diagnosis not present

## 2021-11-19 DIAGNOSIS — R531 Weakness: Secondary | ICD-10-CM | POA: Diagnosis not present

## 2021-11-19 NOTE — Telephone Encounter (Signed)
Ems was called, patient has appointment tomorrow

## 2021-11-19 NOTE — Telephone Encounter (Signed)
FYI: Call from PT w/ [facility] pt fell this morning at 530 am, no other details given

## 2021-11-19 NOTE — Telephone Encounter (Signed)
See if pt has injury, especially if he hit his head. Arrange acute visit if needed.

## 2021-11-20 ENCOUNTER — Encounter: Payer: Self-pay | Admitting: Family Medicine

## 2021-11-20 ENCOUNTER — Ambulatory Visit (INDEPENDENT_AMBULATORY_CARE_PROVIDER_SITE_OTHER): Payer: Medicare Other | Admitting: *Deleted

## 2021-11-20 ENCOUNTER — Telehealth (INDEPENDENT_AMBULATORY_CARE_PROVIDER_SITE_OTHER): Payer: Medicare Other | Admitting: Family Medicine

## 2021-11-20 DIAGNOSIS — I10 Essential (primary) hypertension: Secondary | ICD-10-CM

## 2021-11-20 DIAGNOSIS — S0990XA Unspecified injury of head, initial encounter: Secondary | ICD-10-CM | POA: Diagnosis not present

## 2021-11-20 DIAGNOSIS — W19XXXA Unspecified fall, initial encounter: Secondary | ICD-10-CM

## 2021-11-20 DIAGNOSIS — M810 Age-related osteoporosis without current pathological fracture: Secondary | ICD-10-CM

## 2021-11-20 NOTE — Progress Notes (Signed)
Subjective:    Patient ID: Frederick Davis, male    DOB: December 01, 1922, 88 y.o.   MRN: [id number]   HPI: Frederick Davis is a 88 y.o. male presenting for having fallen during the night.  He is currently taking Eliquis due to recently diagnosed atrial fibrillation.  He is having some headache and family is concerned about internal bleeding.  He hit his head.  Patient is seen via video and was noted to have multiple bruises about the forehead particularly at the left temple and on the nose.  His wife says he fell straight forward onto his face.   Depression screen [facility] 2/9 10/10/2021 10/03/2021 04/13/2021 04/04/2021 10/04/2020  Decreased Interest 1 0 0 0 0  Down, Depressed, Hopeless 1 0 0 0 0  PHQ - 2 Score 2 0 0 0 0  Altered sleeping 1 - - - -  Tired, decreased energy 2 - - - -  Change in appetite 0 - - - -  Feeling bad or failure about yourself  0 - - - -  Trouble concentrating 1 - - - -  Moving slowly or fidgety/restless 2 - - - -  Suicidal thoughts 0 - - - -  PHQ-9 Score 8 - - - -  Difficult doing work/chores Somewhat difficult - - - -  Some recent data might be hidden     Relevant past medical, surgical, family and social history reviewed and updated as indicated.  Interim medical history since our last visit reviewed. Allergies and medications reviewed and updated.  ROS:  Review of Systems  Constitutional:  Negative for fever.  Respiratory:  Negative for shortness of breath.   Cardiovascular:  Negative for chest pain.  Musculoskeletal:  Positive for gait problem (poor balance). Negative for arthralgias.  Skin:  Negative for rash.  Neurological:  Positive for headaches.    Social History   Tobacco Use  Smoking Status Former   Packs/day: 0.50   Years: 10.00   Pack years: 5.00   Types: Cigarettes   Start date: 10/21/1946   Quit date: 10/21/1968   Years since quitting: 53.1  Smokeless Tobacco Never       Objective:     Wt Readings from Last 3 Encounters:   10/07/21 141 lb 5 oz (64.1 kg)  10/03/21 140 lb 9.6 oz (63.8 kg)  09/26/21 143 lb (64.9 kg)     Exam via video shows multiple bruises on the patient's nose and left temple.  Assessment & Plan:   1. Recent head trauma, initial encounter     No orders of the defined types were placed in this encounter.   Orders Placed This Encounter  Procedures   CT HEAD WO CONTRAST (5MM)    Standing Status:   Future    Standing Expiration Date:   11/20/2022    Order Specific Question:   Preferred imaging location?    Answer:   [facility]      Diagnoses and all orders for this visit:  Recent head trauma, initial encounter -     CT HEAD WO CONTRAST (5MM); Future    Virtual Visit via Computer  I discussed the limitations, risks, security and privacy concerns of performing an evaluation and management service by telephone and the availability of in person appointments. The patient was identified with two identifiers. Pt.expressed understanding and agreed to proceed. Pt. Is at home. Dr. Livia Snellen is in his office.  Follow Up Instructions:   I discussed the assessment  and treatment plan with the patient. The patient was provided an opportunity to ask questions and all were answered. The patient agreed with the plan and demonstrated an understanding of the instructions.   The patient was advised to call back or seek an in-person evaluation if the symptoms worsen or if the condition fails to improve as anticipated.   Total minutes contact time: 23   Follow up plan: Return in about 2 weeks (around 12/04/2021).  Frederick Fraise, MD [place]

## 2021-11-21 ENCOUNTER — Other Ambulatory Visit: Payer: Self-pay

## 2021-11-21 ENCOUNTER — Ambulatory Visit (HOSPITAL_COMMUNITY)
Admission: RE | Admit: 2021-11-21 | Discharge: 2021-11-21 | Disposition: A | Discharge status: Home / Self Care | Payer: Medicare Other | Source: Ambulatory Visit | Attending: Family Medicine | Admitting: Family Medicine

## 2021-11-21 ENCOUNTER — Telehealth: Payer: Self-pay | Admitting: Family Medicine

## 2021-11-21 ENCOUNTER — Telehealth: Payer: Medicare Other | Admitting: *Deleted

## 2021-11-21 DIAGNOSIS — S0990XA Unspecified injury of head, initial encounter: Secondary | ICD-10-CM | POA: Diagnosis not present

## 2021-11-21 DIAGNOSIS — I672 Cerebral atherosclerosis: Secondary | ICD-10-CM | POA: Diagnosis not present

## 2021-11-21 NOTE — Telephone Encounter (Signed)
Patient aware.

## 2021-11-21 NOTE — Telephone Encounter (Signed)
Called patient, no answer, left message to return call 

## 2021-11-21 NOTE — Telephone Encounter (Signed)
CT head is normal. Please let pt know

## 2021-11-21 NOTE — Telephone Encounter (Signed)
Wife returning call

## 2021-11-21 NOTE — Chronic Care Management (AMB) (Signed)
Chronic Care Management   CCM RN Visit Note  11/20/2021 Name: Frederick Davis MRN: [id number] DOB: 06-Oct-1923  Subjective: Frederick Davis is a 88 y.o. year old male who is a primary care patient of Stacks, Cletus Gash, MD. The care management team was consulted for assistance with disease management and care coordination needs.    Engaged with patient and patient's wife by telephone  for  care coordination visit  in response to provider referral for case management and/or care coordination services.   Consent to Services:  The patient was given information about Chronic Care Management services, agreed to services, and gave verbal consent prior to initiation of services.  Please see initial visit note for detailed documentation.   Patient agreed to services and verbal consent obtained.   Assessment: Review of patient past medical history, allergies, medications, health status, including review of consultants reports, laboratory and other test data, was performed as part of comprehensive evaluation and provision of chronic care management services.   SDOH (Social Determinants of Health) assessments and interventions performed:    CCM Care Plan  Allergies  Allergen Reactions   Lipitor [Atorvastatin] Other (See Comments)    Muscle aches    Outpatient Encounter Medications as of 11/20/2021  Medication Sig   apixaban (ELIQUIS) 2.5 MG TABS tablet Take 1 tablet (2.5 mg total) by mouth 2 (two) times daily.   benzonatate (TESSALON PERLES) 100 MG capsule Take 1 capsule (100 mg total) by mouth 3 (three) times daily as needed.   carvedilol (COREG) 3.125 MG tablet TAKE 1 TABLET TWICE A DAY WITH MEALS (BREAKFAST AND SUPPER)   furosemide (LASIX) 20 MG tablet TAKE 1 TABLET BY MOUTH DAILY AS NEEDED.   guaiFENesin-codeine (CHERATUSSIN AC) 100-10 MG/5ML syrup Take 5 mLs by mouth every 4 (four) hours as needed for cough.   levothyroxine (SYNTHROID) 75 MCG tablet Take 1 tablet (75 mcg total) by mouth  daily.   lipase/protease/amylase (CREON) 36000 UNITS CPEP capsule Take 1 capsule by mouth at the first bite of each meal, and 1 capsule during meal, and 1 capsule at the last bite of a meal.  Allow for 3 meals per day.  If eating snacks take 1 capsule with snack.   meclizine (ANTIVERT) 25 MG tablet Take 25 mg by mouth 3 (three) times daily as needed for dizziness.   Multiple Vitamin (MULTIVITAMIN) capsule Take 1 capsule daily by mouth.   Multiple Vitamins-Minerals (ZINC PO) Take by mouth at bedtime.   nitroGLYCERIN (NITROSTAT) 0.4 MG SL tablet PLACE 1 TABLET UNDER THE TONGUE AT ONSET OF CHEST PAIN EVERY 5 MINTUES UP TO 3 TIMES AS NEEDED   Facility-Administered Encounter Medications as of 11/20/2021  Medication   etomidate (AMIDATE) injection   lidocaine (cardiac) 100 mg/23ml (XYLOCAINE) 20 MG/ML injection 2%   succinylcholine (ANECTINE) injection    Patient Active Problem List   Diagnosis Date Noted   New onset atrial fibrillation ([place]) 10/07/2021   COVID-19 virus infection 10/07/2021   Hypothyroidism 04/04/2020   Calculus of gallbladder without cholecystitis without obstruction 11/30/2019   Exocrine pancreatic insufficiency 11/30/2019   Educated about COVID-19 virus infection 11/22/2019   Diarrhea in adult patient 06/30/2019   Cuff erosion of artificial urinary sphincter ([place]) 02/23/2018   Malfunction of penile prosthesis ([place]) 12/15/2017   Renal cell carcinoma, right ([place]) 08/11/2017   Vitamin D deficiency 08/11/2017   ASCVD (arteriosclerotic cardiovascular disease) 08/11/2017   Aortic stenosis 05/28/2017   Mitral regurgitation 05/28/2017   Chest pain 05/27/2017   Coronary  artery disease involving native coronary artery of native heart without angina pectoris 03/19/2017   Dyslipidemia 03/19/2017   S/P CABG x 3 12/08/2015   Unstable angina (HCC)    Chest pain with moderate risk of acute coronary syndrome 12/05/2015   Aortic stenosis, mild-2014 12/05/2015   Anginal pain ([place])  10/17/2015   History of renal cell cancer-s/p nephrectomy 07/19/2013   Hyperlipidemia 07/19/2013   Hypotension arterial 03/18/2013   Complete heart block (HCC) 03/18/2013   Anemia    CKD (chronic kidney disease) stage 3, GFR 30-59 ml/min (HCC) 03/12/2013   Acute MI, inferior wall, initial episode of care ([place]) 03/12/2013   Impotence 03/12/2013   History of renal stone 03/12/2013   Hiatal hernia 03/12/2013   H/O lithotripsy 03/12/2013   SCCA (squamous cell carcinoma) of skin 03/12/2013   S/P prostatectomy, radical 03/12/2013   Left renal mass 03/12/2013   Arthritis 03/12/2013   History of STEMI- May 2014 03/12/2013   CAD S/P RCA thrombectomy May 2014, CABG 11/2015 03/12/2013   Cardiogenic shock ([place]) 03/12/2013   Acute respiratory failure ([place]) 03/12/2013   Altered mental status 03/12/2013   Essential hypertension, benign 01/14/2013   Colostomy in place [facility]) 01/14/2013   Prostate cancer ([place]) 01/14/2013   Renal cell carcinoma, Right 01/14/2013   Osteoporosis, unspecified 01/14/2013    Conditions to be addressed/monitored:CAD, HTN, CKD Stage 3, Osteoporosis, and advanced age and high fall risk  Care Plan : [facility] Care Plan     Problem: Chronic Disease Management Needs   Priority: High  Onset Date: 11/20/2021     Long-Range Goal: Work with RN Care Manager Regarding Care Management and [place] with CAD, HTN, CKD 3, Advanced Age, Osteoporosis, High Risk for Falls   Start Date: 11/20/2021  Expected End Date: 11/20/2022  This Visit's Progress: On track  Priority: High  Note:   Current Barriers:  Care Coordination needs related to Transportation  Chronic Disease Management support and education needs related to CAD, HTN, CKD Stage 3, and osteoporosis, advanced age and high risk for falls  RNCM Clinical Goal(s):  Patient will continue to work with Consulting civil engineer and/or Social Worker to address care management and care coordination needs related to CAD, HTN,  CKD Stage 3, Osteoporosis, and advanced age, and high risk for falls as evidenced by adherence to CM Team Scheduled appointments     through collaboration with LCSW, provider, and care team.   Interventions: 1:1 collaboration with primary care provider regarding development and update of comprehensive plan of care as evidenced by provider attestation and co-signature Inter-disciplinary care team collaboration (see longitudinal plan of care) Evaluation of current treatment plan related to  self management and patient's adherence to plan as established by provider   SDOH Barriers (Status: New goal.) Short Term Goal  Patient interviewed and SDOH assessment performed Consulted by PCP regarding urgent need for transportation to [place] appointment Collaborated with [facility] Referral Coordinator regarding appointment Advised patient/wife of scheduled appointment at [facility] on 2/1 at 9:30 with an arrival time of 9:00.  Provided information on where to enter the building and how to check-in Advised that the radiology department will hold patient if abnormal and will call PCP with a report wether normal or abnormal. They should get the results the day of the scan. Collaborated with LCSW regarding transportation needs Collaborated with [employer] [phone] regarding patient's need for urgent transportation to [facility] on 2/1 for CT scan. Unfortunately due to the short interval between request  and appointment time, they were not able to arrange transportation with their drivers or through any of their vendors.  Spoke with patient and his wife about transportation barrier Daughter and son-in-law agreed to take patient to his appointment. He has a wheelchair and they felt comfortable transporting him   Falls:  (Status: New goal.) Long Term Goal  Advised patient of importance of notifying provider of falls Assessed for falls since last encounter Assessed social determinant of health  barriers Discussed mobility and use of assistive devices for ambulation Walker and wheelchair. Mainly uses walker. Discussed recent falls 2 falls within 24 hours. Struck head both times but no LOC. Wife had to call a friend for assistance in getting him up off of the floor Reviewed and discussed PCP video visit from today and order for a STAT head CT Discussed wife's level of care concerns and that he may need SNF placement if mobility and falls continue to pose a safety risk Discussed family/social support Collaborated with LCSW regarding transportation needs and level of care concerns Upcoming appointment with LCSW on 12/06/21 Advised to call 911 or seek appropriate medical attention for any additional falls or if he develops any new or worsening symptoms related to prior falls RNCM to follow-up and review fall prevention strategies       Patient Goals/Self-Care Activities: Attend all scheduled provider appointments Call provider office for new concerns or questions  Move carefully and change positions slowly to avoid falls Use walker or wheelchair for ambulation Seek medical attention or medical advice after any fall Call RN Care Manager as needed [phone] Check-in for CT Head at [facility] on 2/1 at 9:00  Plan:Telephone follow up appointment with care management team member scheduled for:  11/21/21 with [facility] The patient has been provided with contact information for the care management team and has been advised to call with any health related questions or concerns.   Chong Sicilian, BSN, RN-BC Embedded Chronic Care Manager [facility] [place] Management Direct Dial: [phone]

## 2021-11-21 NOTE — Telephone Encounter (Signed)
[facility] Radiology Dept called with call report stating that patients Head CT came back negative.

## 2021-11-21 NOTE — Patient Instructions (Signed)
Visit Information  Patient Goals/Self-Care Activities: Attend all scheduled provider appointments Call provider office for new concerns or questions  Move carefully and change positions slowly to avoid falls Use walker or wheelchair for ambulation Seek medical attention or medical advice after any fall Call [place] as needed [phone] Check-in for CT Head at [facility] on 2/1 at 9:00  The patient verbalized understanding of instructions, educational materials, and care plan provided today and declined offer to receive copy of patient instructions, educational materials, and care plan.   Plan:Telephone follow up appointment with care management team member scheduled for:  11/21/21 with RNCM The patient has been provided with contact information for the care management team and has been advised to call with any health related questions or concerns.   Chong Sicilian, BSN, RN-BC Embedded Chronic Care Manager [facility] / [place] Management Direct Dial: [phone]

## 2021-11-22 ENCOUNTER — Telehealth: Payer: Self-pay | Admitting: Cardiology

## 2021-11-22 NOTE — Telephone Encounter (Signed)
° °  Pt's wife calling back, she said, she was told she will get recommendation from DOD today since Dr. Percival Spanish is not in the office today. She said she needs to know what her husband needs to do

## 2021-11-22 NOTE — Telephone Encounter (Signed)
Pt c/o swelling: STAT is pt has developed SOB within 24 hours  If swelling, where is the swelling located? Legs and feet- fluid is coming ot of them, it got worse last night  How much weight have you gained and in what time span?  No weight gain  Have you gained 3 pounds in a day or 5 pounds in a week?   Do you have a log of your daily weights (if so, list)?   Are you currently taking a fluid pill? yes  Are you currently SOB?  no  Have you traveled recently? no

## 2021-11-22 NOTE — Telephone Encounter (Signed)
Spoke with wife who states patient's legs are weeping continuously since 1/24. She gave him an extra lasix for three days, resulting weight loss from 148 to 145 pounds. She also stated his legs are covered with sores and ooze blood. Explained that she needs to keep his legs clean. The home health nurse did not leave her dressings. Pt fell on Monday and Tuesday, hitting his face. He had a neg CT. Wife asked about patient taking eliquis due to patient having nosebleeds and about lasix doses. Please advise.

## 2021-11-22 NOTE — Telephone Encounter (Signed)
Spoke with pt wife, she reports when he took the double lasix, the edema did go down but not completely. Fluid and sodium restrictions discussed in detail with the wife. Okay given for her to give him 40 mg of furosemide once daily until seen by dr hochrein or his weight and swelling go down. Education regarding eliquis and atrial fib given to the wife and she voiced understanding and appreciated the information. He has a follow up appointment 12/12/21 with dr hochrein and they will call back prior to that appointment with concerns. Pt wife agreed with this plan.

## 2021-11-26 DIAGNOSIS — I2510 Atherosclerotic heart disease of native coronary artery without angina pectoris: Secondary | ICD-10-CM | POA: Diagnosis not present

## 2021-11-26 DIAGNOSIS — U071 COVID-19: Secondary | ICD-10-CM | POA: Diagnosis not present

## 2021-11-26 DIAGNOSIS — I350 Nonrheumatic aortic (valve) stenosis: Secondary | ICD-10-CM | POA: Diagnosis not present

## 2021-11-26 DIAGNOSIS — J1282 Pneumonia due to coronavirus disease 2019: Secondary | ICD-10-CM | POA: Diagnosis not present

## 2021-11-26 DIAGNOSIS — I129 Hypertensive chronic kidney disease with stage 1 through stage 4 chronic kidney disease, or unspecified chronic kidney disease: Secondary | ICD-10-CM | POA: Diagnosis not present

## 2021-11-26 DIAGNOSIS — I4891 Unspecified atrial fibrillation: Secondary | ICD-10-CM | POA: Diagnosis not present

## 2021-11-28 ENCOUNTER — Telehealth: Payer: Self-pay | Admitting: *Deleted

## 2021-11-28 NOTE — Telephone Encounter (Signed)
Spoke with pt wife, she has been giving the patient furosemide 40 mg daily since 11/22/21. She reports his weight has gone down to 145 lb but it has come back up to 147.8 lb today. She reports on leg is still swollen and they are still weeping. She reports he has no SOB or orthopnea. His follow up appointment is 12/12/21 and she wants to make sure she is doing everything she can for him. Aware will forward to dr hochrein for his review.

## 2021-11-30 NOTE — Telephone Encounter (Signed)
Returned call to patients wife- advised her of Dr. Rosezella [place] reccomendations as below   Minus Breeding, MD  Cristopher Estimable, RN 17 hours ago (10:21 PM)   If his weight is up again when we call back have her give him 40 mg bid for two days.      She states patients weight is up to 148.6 lb and his legs are still very swollen- advised her to give him the lasix 40mg  twice daily for 2 days and call back on Monday to give [place] an update-she denies that patient has any other symptoms at present.  Made patient's wife aware of ED precautions should new or worsening symptoms develop over the weekend. Patient's wife verbalized understanding.

## 2021-12-03 ENCOUNTER — Other Ambulatory Visit: Payer: Self-pay | Admitting: Family Medicine

## 2021-12-03 DIAGNOSIS — I2510 Atherosclerotic heart disease of native coronary artery without angina pectoris: Secondary | ICD-10-CM | POA: Diagnosis not present

## 2021-12-03 DIAGNOSIS — I4891 Unspecified atrial fibrillation: Secondary | ICD-10-CM | POA: Diagnosis not present

## 2021-12-03 DIAGNOSIS — J1282 Pneumonia due to coronavirus disease 2019: Secondary | ICD-10-CM | POA: Diagnosis not present

## 2021-12-03 DIAGNOSIS — I129 Hypertensive chronic kidney disease with stage 1 through stage 4 chronic kidney disease, or unspecified chronic kidney disease: Secondary | ICD-10-CM | POA: Diagnosis not present

## 2021-12-03 DIAGNOSIS — I350 Nonrheumatic aortic (valve) stenosis: Secondary | ICD-10-CM | POA: Diagnosis not present

## 2021-12-03 DIAGNOSIS — U071 COVID-19: Secondary | ICD-10-CM | POA: Diagnosis not present

## 2021-12-03 NOTE — Telephone Encounter (Signed)
Follow Up:     Patient's wife is calling. She said she was supposed to give you an update on patient.

## 2021-12-03 NOTE — Telephone Encounter (Signed)
Spoke with pt wife, she reports that his swelling is almost completely gone. She reports his weight went from 148.4lb to 141.4lb today. They will let us know of any concerns prior to the follow up appointment.

## 2021-12-06 ENCOUNTER — Telehealth: Payer: Medicare Other

## 2021-12-07 ENCOUNTER — Telehealth: Payer: Self-pay

## 2021-12-07 DIAGNOSIS — I129 Hypertensive chronic kidney disease with stage 1 through stage 4 chronic kidney disease, or unspecified chronic kidney disease: Secondary | ICD-10-CM | POA: Diagnosis not present

## 2021-12-07 DIAGNOSIS — I350 Nonrheumatic aortic (valve) stenosis: Secondary | ICD-10-CM | POA: Diagnosis not present

## 2021-12-07 DIAGNOSIS — J1282 Pneumonia due to coronavirus disease 2019: Secondary | ICD-10-CM | POA: Diagnosis not present

## 2021-12-07 DIAGNOSIS — I4891 Unspecified atrial fibrillation: Secondary | ICD-10-CM | POA: Diagnosis not present

## 2021-12-07 DIAGNOSIS — U071 COVID-19: Secondary | ICD-10-CM | POA: Diagnosis not present

## 2021-12-07 DIAGNOSIS — I2510 Atherosclerotic heart disease of native coronary artery without angina pectoris: Secondary | ICD-10-CM | POA: Diagnosis not present

## 2021-12-07 NOTE — Telephone Encounter (Signed)
Pt caregiver needed verbal ok to do physcial therapy 2x a week. I have ok

## 2021-12-09 NOTE — Progress Notes (Signed)
Cardiology Office Note   Date:  12/12/2021   ID:  Frederick Davis, DOB 1923/09/18, MRN [id number]  PCP:  Claretta Fraise, MD  Cardiologist:   Minus Breeding, MD   Chief Complaint  Patient presents with   Edema      History of Present Illness: Frederick Davis is a 88 y.o. male who for followup after a acute inferior wall infarct in May of 2014. He had thrombectomy of a right coronary artery occlusion. He was initially acute respiratory failure and was cooled. However, he responded very well to therapy.    He underwent a CABG 3 on February 2017.     Since I last saw him he was admitted with syncopal episode related to aortic valve stenosis and new onset atrial fibrillation.  He has remained rate controlled with his home carvedilol dose, but given his high CHA2DS2-VASc score of 4 he was recommended to start on Eliquis.  He has had 2D echocardiogram with LVEF 55% and no wall motion abnormalities noted.   I reviewed these records for this visit.    He called last week and had increased swelling and weight gain.   He was having weeping and apparently ulcerated legs.  We did have him increase his Lasix to 40 twice a day for couple of days and this seemed to help.  In looking at his weights and he came from the hospital 147 pounds and it has drifted down ripped down to 142.  His swelling is less but still significant.  He is not however having any shortness of breath, PND or orthopnea.  He is not having any palpitations, presyncope or syncope.  He does not notice his atrial fibrillation.  He has been taking his Eliquis but having daily nosebleeds that last through much of the day.  He does have some falls but he has been more careful.  He has not had any presyncope or syncope.  He has had no chest pressure, neck or arm discomfort.   Past Medical History:  Diagnosis Date   Anemia    Aortic stenosis    Moderate // Echo 05/2019: EF 60-65, asymmetric septal LVH, mildly reduced RVSF, RVSP  37.4, mod RAE, severe LAE, mod TR, mod AS (V-max 280 cm/s, mean gradient 23 mmHg, DI 0.35), aortic root 38   Arthritis    left knee    CAD (coronary artery disease)    a. RCA thrombectomy 2014. b. CABGx3 in 11/2015 (EF 45-50% in 11/2015).   Chronic renal insufficiency, stage III (moderate) (HCC)    only has 0ne kidney   Dyspnea    WITH EXERTION   Esophageal stricture YRS AGO   Hypertension    Hypothyroidism    Kidney stone 1969   MI (myocardial infarction) ([place]) 2017   Mitral regurgitation    a. mild by TEE 11/2015.   Myocardial infarction ([place]) 03/12/2013   Inferior MI.  LAD mid 80% stenosis, circ mild plaque, RCA 100% with thrombectomy PTCA.  EF 65%   Osteoporosis    PC (prostate cancer) ([place]) 1987   RBBB    Rectum cancer ([place]) 1986   Renal cell cancer ([place]) 2005    Past Surgical History:  Procedure Laterality Date   CARDIAC CATHETERIZATION N/A 12/06/2015   Procedure: Left Heart Cath and Coronary Angiography;  Surgeon: Burnell Blanks, MD;  Location: [place] [facility];  Service: Cardiovascular;  Laterality: N/A;   COLONOSCOPY  01/08/2012   Procedure: COLONOSCOPY;  Surgeon: Mechele Dawley  Laural Golden, MD;  [facility];  Service: Endoscopy;  Laterality: N/A;  1030   COLONOSCOPY N/A 07/20/2015   Procedure: COLONOSCOPY;  Surgeon: Rogene Houston, MD;  [facility];  Service: Endoscopy;  Laterality: N/A;  930 - moved to 9/29 @ 2:00   COLOSTOMY Left 1986   colon cancer   CORONARY ARTERY BYPASS GRAFT N/A 12/08/2015   Procedure: CORONARY ARTERY BYPASS GRAFTING (CABG);  Surgeon: Melrose Nakayama, MD;  Location: [facility];  Service: Open Heart Surgery;  Laterality: N/A;  Times 3 using left internal mammary artery and endoscopycally harvested bilateral saphenous vein.   HERNIA REPAIR     2007 RIGHT INGUINAL HERNIA   KIDNEY SURGERY Right 2004   LEFT HEART CATHETERIZATION WITH CORONARY ANGIOGRAM N/A 03/12/2013   Procedure: STEMI;  Surgeon: Burnell Blanks, MD;   Location: [facility];  Service: Cardiovascular;  Laterality: N/A;   PERCUTANEOUS CORONARY INTERVENTION-BALLOON ONLY     PROSTATECTOMY     RADIOFREQUENCY ABLATION KIDNEY  2006   REMOVAL OF PENILE PROSTHESIS N/A 12/15/2017   Procedure: REMOVAL OF PENILE PROSTHESIS;  Surgeon: Franchot Gallo, MD;  [facility];  Service: Urology;  Laterality: N/A;   REMOVAL OF PENILE PROSTHESIS N/A 02/23/2018   Procedure: [name] OF ERODED SPHINCTER;  Surgeon: Franchot Gallo, MD;  [facility];  Service: Urology;  Laterality: N/A;   renal sphincter appliance  2007   TEE WITHOUT CARDIOVERSION N/A 12/08/2015   Procedure: TRANSESOPHAGEAL ECHOCARDIOGRAM (TEE);  Surgeon: Melrose Nakayama, MD;  Location: [place];  Service: Open Heart Surgery;  Laterality: N/A;   Prior to Admission medications   Medication Sig Start Date End Date Taking? Authorizing Provider  apixaban (ELIQUIS) 2.5 MG TABS tablet Take 1 tablet (2.5 mg total) by mouth 2 (two) times daily. 10/08/21  Yes Shah, Pratik D, DO  carvedilol (COREG) 3.125 MG tablet TAKE 1 TABLET TWICE A DAY WITH MEALS (BREAKFAST AND SUPPER) 10/03/21  Yes Stacks, Cletus Gash, MD  levothyroxine (SYNTHROID) 75 MCG tablet Take 1 tablet (75 mcg total) by mouth daily. 10/03/21  Yes Claretta Fraise, MD  lipase/protease/amylase (CREON) 36000 UNITS CPEP capsule Take 1 capsule by mouth at the first bite of each meal, and 1 capsule during meal, and 1 capsule at the last bite of a meal.  Allow for 3 meals per day.  If eating snacks take 1 capsule with snack. 11/02/19  Yes [provider]  meclizine (ANTIVERT) 25 MG tablet Take 25 mg by mouth 3 (three) times daily as needed for dizziness.   Yes [provider]  Multiple Vitamin (MULTIVITAMIN) capsule Take 1 capsule daily by mouth.   Yes [provider]  Multiple Vitamins-Minerals (ZINC PO) Take by mouth at bedtime.   Yes [provider]  nitroGLYCERIN (NITROSTAT) 0.4 MG SL tablet PLACE 1 TABLET UNDER  THE TONGUE AT ONSET OF CHEST PAIN EVERY 5 MINTUES UP TO 3 TIMES AS NEEDED 10/29/21  Yes Almyra Deforest, PA  benzonatate (TESSALON PERLES) 100 MG capsule Take 1 capsule (100 mg total) by mouth 3 (three) times daily as needed. Patient not taking: Reported on 12/12/2021 09/26/21   Janora Norlander, DO  furosemide (LASIX) 20 MG tablet TAKE 1 TABLET BY MOUTH DAILY AS NEEDED. Patient taking differently: Take 40 mg by mouth daily. 10/29/21   Dettinger, Fransisca Kaufmann, MD  guaiFENesin-codeine (CHERATUSSIN AC) 100-10 MG/5ML syrup Take 5 mLs by mouth every 4 (four) hours as needed for cough. Patient not taking: Reported on 12/12/2021 10/03/21  Claretta Fraise, MD      Allergies:   Lipitor [atorvastatin]    ROS:  Please see the history of present illness.   Otherwise, review of systems are positive for none.   All other systems are reviewed and negative.    PHYSICAL EXAM: VS:  BP 100/70    Pulse 64    Ht 5\' 3"  (1.6 m)    Wt 148 lb (67.1 kg)    BMI 26.22 kg/m  , BMI Body mass index is 26.22 kg/m. GEN:  No distress, frail NECK:  No jugular venous distention at 90 degrees, waveform within normal limits, carotid upstroke brisk and symmetric, no bruits, no thyromegaly LYMPHATICS:  No cervical adenopathy LUNGS:  Clear to auscultation bilaterally BACK:  No CVA tenderness CHEST:  Unremarkable HEART:  S1 and S2 within normal limits, no S3, no clicks, no rubs, 3 out of 6 apical systolic murmur radiating at the aortic outflow tract, no diastolic murmurs, irregular ABD:  Positive bowel sounds normal in frequency in pitch, no bruits, no rebound, no guarding, unable to assess midline mass or bruit with the patient seated. EXT:  2 plus pulses throughout, moderate edema, no cyanosis no clubbing SKIN:  No rashes no nodules NEURO:  Cranial nerves II through XII grossly intact, motor grossly intact throughout PSYCH:  Cognitively intact, oriented to person place and time  EKG:  EKG is not ordered today.   Recent  Labs: 10/07/2021: Magnesium 2.0; TSH 0.915 10/08/2021: ALT 13; BUN 32; Creatinine, Ser 1.21; Hemoglobin 10.2; Platelets 255; Potassium 4.1; Sodium 137    Lipid Panel    Component Value Date/Time   CHOL 94 (L) 10/03/2021 0952   CHOL 106 05/27/2013 0818   TRIG 66 10/03/2021 0952   TRIG 81 10/29/2016 0815   TRIG 76 05/27/2013 0818   HDL 45 10/03/2021 0952   HDL 54 10/29/2016 0815   HDL 62 05/27/2013 0818   CHOLHDL 2.1 10/03/2021 0952   CHOLHDL 2.8 10/18/2015 0530   VLDL 19 10/18/2015 0530   LDLCALC 34 10/03/2021 0952   LDLCALC 29 08/09/2014 0854   LDLCALC 29 05/27/2013 0818      Wt Readings from Last 3 Encounters:  12/12/21 148 lb (67.1 kg)  10/07/21 141 lb 5 oz (64.1 kg)  10/03/21 140 lb 9.6 oz (63.8 kg)      Other studies Reviewed: Additional studies/ records that were reviewed today include: Hospital records extensive review. Review of the above records demonstrates: As above   ASSESSMENT AND PLAN:  ATRIAL FIB: Despite his high stroke risk given his advanced age he is having constant nosebleeds and so needs to stop the Eliquis.  He will start a baby aspirin but if he has nosebleeds on this I would discontinue this altogether.  CAD/CABG:     We will continue with risk reduction.  HTN:   The blood pressure is at target and in fact slightly low but I will keep an eye on this.   DYSLIPIDEMIA:    LDL is at target and I would not avoid further medications or not starting any statins.    AORTIC STENOSIS: This is severe but he is not a candidate for invasive treatment.  We will manage this expectantly.  He is not a candidate for procedural management.   CKD IIIB: Creatinine is 1.21 in December which was lower than previous.   LEG SWELLING: I am going to stop his Lasix and start Demadex 40 mg daily.  He is going to keep his  feet elevated.  I am going to have to keep a close eye on his renal function we will get treatment next week.   Current medicines are reviewed at  length with the patient today.  The patient does not have concerns regarding medicines.  The following changes have been made: As above  Labs/ tests ordered today include:    Orders Placed This Encounter  Procedures   Basic metabolic panel     Disposition:   FU with me in 1 month.    Signed, Minus Breeding, MD  12/12/2021 3:26 PM    [facility]

## 2021-12-10 DIAGNOSIS — K222 Esophageal obstruction: Secondary | ICD-10-CM | POA: Diagnosis not present

## 2021-12-10 DIAGNOSIS — E039 Hypothyroidism, unspecified: Secondary | ICD-10-CM | POA: Diagnosis not present

## 2021-12-10 DIAGNOSIS — I350 Nonrheumatic aortic (valve) stenosis: Secondary | ICD-10-CM | POA: Diagnosis not present

## 2021-12-10 DIAGNOSIS — I129 Hypertensive chronic kidney disease with stage 1 through stage 4 chronic kidney disease, or unspecified chronic kidney disease: Secondary | ICD-10-CM | POA: Diagnosis not present

## 2021-12-10 DIAGNOSIS — Z905 Acquired absence of kidney: Secondary | ICD-10-CM | POA: Diagnosis not present

## 2021-12-10 DIAGNOSIS — Z951 Presence of aortocoronary bypass graft: Secondary | ICD-10-CM | POA: Diagnosis not present

## 2021-12-10 DIAGNOSIS — Z9079 Acquired absence of other genital organ(s): Secondary | ICD-10-CM | POA: Diagnosis not present

## 2021-12-10 DIAGNOSIS — Z85048 Personal history of other malignant neoplasm of rectum, rectosigmoid junction, and anus: Secondary | ICD-10-CM | POA: Diagnosis not present

## 2021-12-10 DIAGNOSIS — K8689 Other specified diseases of pancreas: Secondary | ICD-10-CM | POA: Diagnosis not present

## 2021-12-10 DIAGNOSIS — I4891 Unspecified atrial fibrillation: Secondary | ICD-10-CM | POA: Diagnosis not present

## 2021-12-10 DIAGNOSIS — I4510 Unspecified right bundle-branch block: Secondary | ICD-10-CM | POA: Diagnosis not present

## 2021-12-10 DIAGNOSIS — J1282 Pneumonia due to coronavirus disease 2019: Secondary | ICD-10-CM | POA: Diagnosis not present

## 2021-12-10 DIAGNOSIS — Z8546 Personal history of malignant neoplasm of prostate: Secondary | ICD-10-CM | POA: Diagnosis not present

## 2021-12-10 DIAGNOSIS — Z87891 Personal history of nicotine dependence: Secondary | ICD-10-CM | POA: Diagnosis not present

## 2021-12-10 DIAGNOSIS — M1712 Unilateral primary osteoarthritis, left knee: Secondary | ICD-10-CM | POA: Diagnosis not present

## 2021-12-10 DIAGNOSIS — D631 Anemia in chronic kidney disease: Secondary | ICD-10-CM | POA: Diagnosis not present

## 2021-12-10 DIAGNOSIS — I252 Old myocardial infarction: Secondary | ICD-10-CM | POA: Diagnosis not present

## 2021-12-10 DIAGNOSIS — M810 Age-related osteoporosis without current pathological fracture: Secondary | ICD-10-CM | POA: Diagnosis not present

## 2021-12-10 DIAGNOSIS — Z933 Colostomy status: Secondary | ICD-10-CM | POA: Diagnosis not present

## 2021-12-10 DIAGNOSIS — Z8553 Personal history of malignant neoplasm of renal pelvis: Secondary | ICD-10-CM | POA: Diagnosis not present

## 2021-12-10 DIAGNOSIS — U071 COVID-19: Secondary | ICD-10-CM | POA: Diagnosis not present

## 2021-12-10 DIAGNOSIS — I2510 Atherosclerotic heart disease of native coronary artery without angina pectoris: Secondary | ICD-10-CM | POA: Diagnosis not present

## 2021-12-10 DIAGNOSIS — N183 Chronic kidney disease, stage 3 unspecified: Secondary | ICD-10-CM | POA: Diagnosis not present

## 2021-12-10 DIAGNOSIS — Z9181 History of falling: Secondary | ICD-10-CM | POA: Diagnosis not present

## 2021-12-10 DIAGNOSIS — Z7901 Long term (current) use of anticoagulants: Secondary | ICD-10-CM | POA: Diagnosis not present

## 2021-12-11 ENCOUNTER — Telehealth: Payer: Medicare Other

## 2021-12-11 ENCOUNTER — Telehealth: Payer: Self-pay | Admitting: Licensed Clinical Social Worker

## 2021-12-11 NOTE — Telephone Encounter (Signed)
Chronic Care Management     Clinical Social Work Note   12/11/21 Name: BAPTISTE ODENS       MRN: [id number]       DOB: 03/20/1923   DION LATENDRESSE is a 88 y.o. year old male who is a primary care patient of Stacks, Broadus John, MD. The CCM team was consulted to assist the patient with chronic disease management and/or care coordination needs related to: [employer] .    Unsuccessful phone call to client today. LCSW did leave phone message for [place] or his spouse, Alvira Philips, requesting that [place] or Alvira Philips please call LCSW at [phone]  Follow Up Date:  02/05/22 at 11:00 AM  Kelton Pillar.Lio Wehrly MSW, LCSW Licensed Visual merchandiser [facility] Care Management [phone]

## 2021-12-12 ENCOUNTER — Ambulatory Visit (INDEPENDENT_AMBULATORY_CARE_PROVIDER_SITE_OTHER): Payer: Medicare Other | Admitting: Cardiology

## 2021-12-12 ENCOUNTER — Encounter: Payer: Self-pay | Admitting: Cardiology

## 2021-12-12 ENCOUNTER — Other Ambulatory Visit: Payer: Self-pay

## 2021-12-12 ENCOUNTER — Other Ambulatory Visit: Payer: Self-pay | Admitting: *Deleted

## 2021-12-12 VITALS — BP 100/70 | HR 64 | Ht 63.0 in | Wt 148.0 lb

## 2021-12-12 DIAGNOSIS — I10 Essential (primary) hypertension: Secondary | ICD-10-CM

## 2021-12-12 DIAGNOSIS — U071 COVID-19: Secondary | ICD-10-CM | POA: Diagnosis not present

## 2021-12-12 DIAGNOSIS — Z79899 Other long term (current) drug therapy: Secondary | ICD-10-CM | POA: Diagnosis not present

## 2021-12-12 DIAGNOSIS — I2510 Atherosclerotic heart disease of native coronary artery without angina pectoris: Secondary | ICD-10-CM | POA: Diagnosis not present

## 2021-12-12 DIAGNOSIS — J1282 Pneumonia due to coronavirus disease 2019: Secondary | ICD-10-CM | POA: Diagnosis not present

## 2021-12-12 DIAGNOSIS — I129 Hypertensive chronic kidney disease with stage 1 through stage 4 chronic kidney disease, or unspecified chronic kidney disease: Secondary | ICD-10-CM | POA: Diagnosis not present

## 2021-12-12 DIAGNOSIS — I4891 Unspecified atrial fibrillation: Secondary | ICD-10-CM | POA: Diagnosis not present

## 2021-12-12 DIAGNOSIS — I350 Nonrheumatic aortic (valve) stenosis: Secondary | ICD-10-CM | POA: Diagnosis not present

## 2021-12-12 MED ORDER — TORSEMIDE 20 MG PO TABS: 40.0000 mg | ORAL_TABLET | Freq: Every day | ORAL | 3 refills | Status: DC

## 2021-12-12 MED ORDER — ASPIRIN EC 81 MG PO TBEC: 81.0000 mg | DELAYED_RELEASE_TABLET | Freq: Every day | ORAL | 3 refills | Status: DC

## 2021-12-12 NOTE — Patient Instructions (Signed)
Medication Instructions:  Please discontinue your Furosemide and start Demadex 40 mg a day. (20 mg tablets - take 2 in the morning) Discontinue your Eliquis and start Aspirin 81 mg a day. Continue all other medications as listed.  *If you need a refill on your cardiac medications before your next appointment, please call your pharmacy*   Lab Work: Please have blood work in 1 week (BMP) at [facility].  If you have labs (blood work) drawn today and your tests are completely normal, you will receive your results only by: [place] (if you have MyChart) OR A paper copy in the mail If you have any lab test that is abnormal or we need to change your treatment, we will call you to review the results.  Follow-Up: At [facility], you and your health needs are our priority.  As part of our continuing mission to provide you with exceptional heart care, we have created designated Provider Care Teams.  These Care Teams include your primary Cardiologist (physician) and Advanced Practice Providers (APPs -  Physician Assistants and Nurse Practitioners) who all work together to provide you with the care you need, when you need it.  We recommend signing up for the patient portal called "MyChart".  Sign up information is provided on this After Visit Summary.  MyChart is used to connect with patients for Virtual Visits (Telemedicine).  Patients are able to view lab/test results, encounter notes, upcoming appointments, etc.  Non-urgent messages can be sent to your provider as well.   To learn more about what you can do with MyChart, go to [email or web address].    Your next appointment:   1 month(s)  The format for your next appointment:   In Person  Provider:   Minus Breeding, MD{   Thank you for choosing [facility]!!

## 2021-12-17 DIAGNOSIS — I2510 Atherosclerotic heart disease of native coronary artery without angina pectoris: Secondary | ICD-10-CM | POA: Diagnosis not present

## 2021-12-17 DIAGNOSIS — U071 COVID-19: Secondary | ICD-10-CM | POA: Diagnosis not present

## 2021-12-17 DIAGNOSIS — J1282 Pneumonia due to coronavirus disease 2019: Secondary | ICD-10-CM | POA: Diagnosis not present

## 2021-12-17 DIAGNOSIS — I350 Nonrheumatic aortic (valve) stenosis: Secondary | ICD-10-CM | POA: Diagnosis not present

## 2021-12-17 DIAGNOSIS — I4891 Unspecified atrial fibrillation: Secondary | ICD-10-CM | POA: Diagnosis not present

## 2021-12-17 DIAGNOSIS — I129 Hypertensive chronic kidney disease with stage 1 through stage 4 chronic kidney disease, or unspecified chronic kidney disease: Secondary | ICD-10-CM | POA: Diagnosis not present

## 2021-12-19 DIAGNOSIS — I4891 Unspecified atrial fibrillation: Secondary | ICD-10-CM | POA: Diagnosis not present

## 2021-12-19 DIAGNOSIS — I129 Hypertensive chronic kidney disease with stage 1 through stage 4 chronic kidney disease, or unspecified chronic kidney disease: Secondary | ICD-10-CM | POA: Diagnosis not present

## 2021-12-19 DIAGNOSIS — I2510 Atherosclerotic heart disease of native coronary artery without angina pectoris: Secondary | ICD-10-CM | POA: Diagnosis not present

## 2021-12-19 DIAGNOSIS — I350 Nonrheumatic aortic (valve) stenosis: Secondary | ICD-10-CM | POA: Diagnosis not present

## 2021-12-19 DIAGNOSIS — U071 COVID-19: Secondary | ICD-10-CM | POA: Diagnosis not present

## 2021-12-19 DIAGNOSIS — J1282 Pneumonia due to coronavirus disease 2019: Secondary | ICD-10-CM | POA: Diagnosis not present

## 2021-12-20 ENCOUNTER — Other Ambulatory Visit: Payer: Medicare Other

## 2021-12-20 DIAGNOSIS — I2510 Atherosclerotic heart disease of native coronary artery without angina pectoris: Secondary | ICD-10-CM | POA: Diagnosis not present

## 2021-12-20 DIAGNOSIS — Z79899 Other long term (current) drug therapy: Secondary | ICD-10-CM | POA: Diagnosis not present

## 2021-12-20 DIAGNOSIS — I10 Essential (primary) hypertension: Secondary | ICD-10-CM | POA: Diagnosis not present

## 2021-12-21 ENCOUNTER — Other Ambulatory Visit: Payer: Self-pay

## 2021-12-21 DIAGNOSIS — Z79899 Other long term (current) drug therapy: Secondary | ICD-10-CM

## 2021-12-21 LAB — BASIC METABOLIC PANEL
BUN/Creatinine Ratio: 26 — ABNORMAL HIGH (ref 10–24)
BUN: 45 mg/dL — ABNORMAL HIGH (ref 10–36)
CO2: 26 mmol/L (ref 20–29)
Calcium: 9.8 mg/dL (ref 8.6–10.2)
Chloride: 101 mmol/L (ref 96–106)
Creatinine, Ser: 1.76 mg/dL — ABNORMAL HIGH (ref 0.76–1.27)
Glucose: 87 mg/dL (ref 70–99)
Potassium: 4.5 mmol/L (ref 3.5–5.2)
Sodium: 139 mmol/L (ref 134–144)
eGFR: 35 mL/min/{1.73_m2} — ABNORMAL LOW (ref >59)

## 2021-12-21 MED ORDER — TORSEMIDE 20 MG PO TABS: 20.0000 mg | ORAL_TABLET | Freq: Every day | ORAL | 3 refills | Status: DC

## 2021-12-24 DIAGNOSIS — I350 Nonrheumatic aortic (valve) stenosis: Secondary | ICD-10-CM | POA: Diagnosis not present

## 2021-12-24 DIAGNOSIS — I4891 Unspecified atrial fibrillation: Secondary | ICD-10-CM | POA: Diagnosis not present

## 2021-12-24 DIAGNOSIS — I129 Hypertensive chronic kidney disease with stage 1 through stage 4 chronic kidney disease, or unspecified chronic kidney disease: Secondary | ICD-10-CM | POA: Diagnosis not present

## 2021-12-24 DIAGNOSIS — J1282 Pneumonia due to coronavirus disease 2019: Secondary | ICD-10-CM | POA: Diagnosis not present

## 2021-12-24 DIAGNOSIS — I2510 Atherosclerotic heart disease of native coronary artery without angina pectoris: Secondary | ICD-10-CM | POA: Diagnosis not present

## 2021-12-24 DIAGNOSIS — U071 COVID-19: Secondary | ICD-10-CM | POA: Diagnosis not present

## 2021-12-26 ENCOUNTER — Ambulatory Visit (INDEPENDENT_AMBULATORY_CARE_PROVIDER_SITE_OTHER): Payer: Medicare Other

## 2021-12-26 ENCOUNTER — Other Ambulatory Visit: Payer: Self-pay

## 2021-12-26 DIAGNOSIS — D631 Anemia in chronic kidney disease: Secondary | ICD-10-CM | POA: Diagnosis not present

## 2021-12-26 DIAGNOSIS — K8689 Other specified diseases of pancreas: Secondary | ICD-10-CM | POA: Diagnosis not present

## 2021-12-26 DIAGNOSIS — I2510 Atherosclerotic heart disease of native coronary artery without angina pectoris: Secondary | ICD-10-CM | POA: Diagnosis not present

## 2021-12-26 DIAGNOSIS — Z8616 Personal history of COVID-19: Secondary | ICD-10-CM | POA: Diagnosis not present

## 2021-12-26 DIAGNOSIS — J1282 Pneumonia due to coronavirus disease 2019: Secondary | ICD-10-CM | POA: Diagnosis not present

## 2021-12-26 DIAGNOSIS — K222 Esophageal obstruction: Secondary | ICD-10-CM | POA: Diagnosis not present

## 2021-12-26 DIAGNOSIS — I350 Nonrheumatic aortic (valve) stenosis: Secondary | ICD-10-CM | POA: Diagnosis not present

## 2021-12-26 DIAGNOSIS — E039 Hypothyroidism, unspecified: Secondary | ICD-10-CM | POA: Diagnosis not present

## 2021-12-26 DIAGNOSIS — I129 Hypertensive chronic kidney disease with stage 1 through stage 4 chronic kidney disease, or unspecified chronic kidney disease: Secondary | ICD-10-CM | POA: Diagnosis not present

## 2021-12-26 DIAGNOSIS — N183 Chronic kidney disease, stage 3 unspecified: Secondary | ICD-10-CM | POA: Diagnosis not present

## 2021-12-26 DIAGNOSIS — U071 COVID-19: Secondary | ICD-10-CM | POA: Diagnosis not present

## 2021-12-26 DIAGNOSIS — M1712 Unilateral primary osteoarthritis, left knee: Secondary | ICD-10-CM | POA: Diagnosis not present

## 2021-12-26 DIAGNOSIS — I4891 Unspecified atrial fibrillation: Secondary | ICD-10-CM | POA: Diagnosis not present

## 2021-12-31 ENCOUNTER — Other Ambulatory Visit: Payer: Medicare Other

## 2021-12-31 DIAGNOSIS — I2510 Atherosclerotic heart disease of native coronary artery without angina pectoris: Secondary | ICD-10-CM | POA: Diagnosis not present

## 2021-12-31 DIAGNOSIS — I129 Hypertensive chronic kidney disease with stage 1 through stage 4 chronic kidney disease, or unspecified chronic kidney disease: Secondary | ICD-10-CM | POA: Diagnosis not present

## 2021-12-31 DIAGNOSIS — I350 Nonrheumatic aortic (valve) stenosis: Secondary | ICD-10-CM | POA: Diagnosis not present

## 2021-12-31 DIAGNOSIS — Z79899 Other long term (current) drug therapy: Secondary | ICD-10-CM | POA: Diagnosis not present

## 2021-12-31 DIAGNOSIS — U071 COVID-19: Secondary | ICD-10-CM | POA: Diagnosis not present

## 2021-12-31 DIAGNOSIS — J1282 Pneumonia due to coronavirus disease 2019: Secondary | ICD-10-CM | POA: Diagnosis not present

## 2021-12-31 DIAGNOSIS — I4891 Unspecified atrial fibrillation: Secondary | ICD-10-CM | POA: Diagnosis not present

## 2022-01-01 DIAGNOSIS — K8681 Exocrine pancreatic insufficiency: Secondary | ICD-10-CM | POA: Diagnosis not present

## 2022-01-01 LAB — BASIC METABOLIC PANEL
BUN/Creatinine Ratio: 33 — ABNORMAL HIGH (ref 10–24)
BUN: 54 mg/dL — ABNORMAL HIGH (ref 10–36)
CO2: 24 mmol/L (ref 20–29)
Calcium: 9.9 mg/dL (ref 8.6–10.2)
Chloride: 100 mmol/L (ref 96–106)
Creatinine, Ser: 1.66 mg/dL — ABNORMAL HIGH (ref 0.76–1.27)
Glucose: 130 mg/dL — ABNORMAL HIGH (ref 70–99)
Potassium: 5 mmol/L (ref 3.5–5.2)
Sodium: 138 mmol/L (ref 134–144)
eGFR: 37 mL/min/{1.73_m2} — ABNORMAL LOW (ref >59)

## 2022-01-02 ENCOUNTER — Encounter: Payer: Self-pay | Admitting: Family Medicine

## 2022-01-02 DIAGNOSIS — U071 COVID-19: Secondary | ICD-10-CM | POA: Diagnosis not present

## 2022-01-02 DIAGNOSIS — I129 Hypertensive chronic kidney disease with stage 1 through stage 4 chronic kidney disease, or unspecified chronic kidney disease: Secondary | ICD-10-CM | POA: Diagnosis not present

## 2022-01-02 DIAGNOSIS — I4891 Unspecified atrial fibrillation: Secondary | ICD-10-CM | POA: Diagnosis not present

## 2022-01-02 DIAGNOSIS — J1282 Pneumonia due to coronavirus disease 2019: Secondary | ICD-10-CM | POA: Diagnosis not present

## 2022-01-02 DIAGNOSIS — I2510 Atherosclerotic heart disease of native coronary artery without angina pectoris: Secondary | ICD-10-CM | POA: Diagnosis not present

## 2022-01-02 DIAGNOSIS — I350 Nonrheumatic aortic (valve) stenosis: Secondary | ICD-10-CM | POA: Diagnosis not present

## 2022-01-03 ENCOUNTER — Encounter: Payer: Self-pay | Admitting: *Deleted

## 2022-01-07 DIAGNOSIS — I5033 Acute on chronic diastolic (congestive) heart failure: Secondary | ICD-10-CM | POA: Insufficient documentation

## 2022-01-07 DIAGNOSIS — M7989 Other specified soft tissue disorders: Secondary | ICD-10-CM | POA: Insufficient documentation

## 2022-01-07 NOTE — Progress Notes (Signed)
?  ?Cardiology Office Note ? ? ?Date:  01/09/2022  ? ?ID:  Frederick Davis, DOB Apr 13, 1923, MRN [id number] ? ?PCP:  Claretta Fraise, MD  ?Cardiologist:   Minus Breeding, MD ? ? ?Chief Complaint  ?Patient presents with  ? Edema  ? ? ?  ?History of Present Illness: ?Frederick Davis is a 88 y.o. male who for followup after a acute inferior wall infarct in May of 2014. He had thrombectomy of a right coronary artery occlusion. He was initially acute respiratory failure and was cooled. However, he responded very well to therapy.    He underwent a CABG ?3 on February 2017.    ? ?He was admitted with syncopal episode related to aortic valve stenosis and new onset atrial fibrillation.  He has remained rate controlled with his carvedilol, but given his high CHA2DS2-VASc score of 4 he was recommended to start on Eliquis.  He has had 2D echocardiogram with LVEF 55% and no wall motion abnormalities noted.   I reviewed these records for this visit.    He called last week and had increased swelling and weight gain.   At the last visit I changed Lasix to Demadex.  I did stop his Eliquis because of almost continuous nosebleeds. ? ?His lower extremity swelling is still present but it seems to be less than previous.  He has had occasional weeping but not like it was.  His creatinine has been followed and it is up but it has stayed stable over serial readings.  His nose bleeding is much improved compared to previous.  He is not having continuous bleeding he did have when he was on the Eliquis.  He is not having any new shortness of breath, PND or orthopnea.  He is not having any new palpitations, presyncope or syncope.  He would not notice his fibrillation. ? ? ?Past Medical History:  ?Diagnosis Date  ? Anemia   ? Aortic stenosis   ? Moderate // Echo 05/2019: EF 60-65, asymmetric septal LVH, mildly reduced RVSF, RVSP 37.4, mod RAE, severe LAE, mod TR, mod AS (V-max 280 cm/s, mean gradient 23 mmHg, DI 0.35), aortic root 38  ? Arthritis    ? left knee   ? CAD (coronary artery disease)   ? a. RCA thrombectomy 2014. b. CABGx3 in 11/2015 (EF 45-50% in 11/2015).  ? Chronic renal insufficiency, stage III (moderate) (HCC)   ? only has 0ne kidney  ? Dyspnea   ? WITH EXERTION  ? Esophageal stricture YRS AGO  ? Hypertension   ? Hypothyroidism   ? Kidney stone 1969  ? MI (myocardial infarction) ([name]) 2017  ? Mitral regurgitation   ? a. mild by TEE 11/2015.  ? Myocardial infarction ([name]) 03/12/2013  ? Inferior MI.  LAD mid 80% stenosis, circ mild plaque, RCA 100% with thrombectomy PTCA.  EF 65%  ? Osteoporosis   ? PC (prostate cancer) ([name]) 1987  ? RBBB   ? Rectum cancer ([name]) 1986  ? Renal cell cancer ([name]) 2005  ? ? ?Past Surgical History:  ?Procedure Laterality Date  ? CARDIAC CATHETERIZATION N/A 12/06/2015  ? Procedure: Left Heart Cath and Coronary Angiography;  Surgeon: Burnell Blanks, MD;  Location: [place] [facility];  Service: Cardiovascular;  Laterality: N/A;  ? COLONOSCOPY  01/08/2012  ? Procedure: COLONOSCOPY;  Surgeon: Rogene Houston, MD;  [facility];  Service: Endoscopy;  Laterality: N/A;  1030  ? COLONOSCOPY N/A 07/20/2015  ? Procedure: COLONOSCOPY;  Surgeon: Bernadene Person  Gloriann Loan, MD;  [facility];  Service: Endoscopy;  Laterality: N/A;  930 - moved to 9/29 @ 2:00  ? COLOSTOMY Left 1986  ? colon cancer  ? CORONARY ARTERY BYPASS GRAFT N/A 12/08/2015  ? Procedure: CORONARY ARTERY BYPASS GRAFTING (CABG);  Surgeon: Melrose Nakayama, MD;  Location: [place];  Service: Open Heart Surgery;  Laterality: N/A;  Times 3 using left internal mammary artery and endoscopycally harvested bilateral saphenous vein.  ? HERNIA REPAIR    ? 2007 RIGHT INGUINAL HERNIA  ? KIDNEY SURGERY Right 2004  ? LEFT HEART CATHETERIZATION WITH CORONARY ANGIOGRAM N/A 03/12/2013  ? Procedure: STEMI;  Surgeon: Burnell Blanks, MD;  Location: [facility] CATH LAB;  Service: Cardiovascular;  Laterality: N/A;  ? PERCUTANEOUS CORONARY INTERVENTION-BALLOON ONLY    ?  PROSTATECTOMY    ? [place] KIDNEY  2006  ? REMOVAL OF PENILE PROSTHESIS N/A 12/15/2017  ? Procedure: REMOVAL OF PENILE PROSTHESIS;  Surgeon: Franchot Gallo, MD;  [facility];  Service: Urology;  Laterality: N/A;  ? REMOVAL OF PENILE PROSTHESIS N/A 02/23/2018  ? Procedure: EXPLANT OF ERODED SPHINCTER;  Surgeon: Franchot Gallo, MD;  [facility];  Service: Urology;  Laterality: N/A;  ? renal sphincter appliance  2007  ? TEE WITHOUT CARDIOVERSION N/A 12/08/2015  ? Procedure: TRANSESOPHAGEAL ECHOCARDIOGRAM (TEE);  Surgeon: Melrose Nakayama, MD;  Location: [place];  Service: Open Heart Surgery;  Laterality: N/A;  ? ?Prior to Admission medications   ?Medication Sig Start Date End Date Taking? Authorizing Provider  ?aspirin EC 81 MG tablet Take 1 tablet (81 mg total) by mouth daily. Swallow whole. 12/12/21  Yes Minus Breeding, MD  ?carvedilol (COREG) 3.125 MG tablet TAKE 1 TABLET TWICE A DAY WITH MEALS (BREAKFAST AND SUPPER) 10/03/21  Yes Claretta Fraise, MD  ?levothyroxine (SYNTHROID) 75 MCG tablet Take 1 tablet (75 mcg total) by mouth daily. 10/03/21  Yes Claretta Fraise, MD  ?lipase/protease/amylase (CREON) 36000 UNITS CPEP capsule Take 1 capsule by mouth at the first bite of each meal, and 1 capsule during meal, and 1 capsule at the last bite of a meal.  Allow for 3 meals per day.  If eating snacks take 1 capsule with snack. 11/02/19  Yes [provider]  ?meclizine (ANTIVERT) 25 MG tablet Take 25 mg by mouth 3 (three) times daily as needed for dizziness.   Yes [provider]  ?Multiple Vitamin (MULTIVITAMIN) capsule Take 1 capsule daily by mouth.   Yes [provider]  ?Multiple Vitamins-Minerals (ZINC PO) Take by mouth at bedtime.   Yes [provider]  ?nitroGLYCERIN (NITROSTAT) 0.4 MG SL tablet PLACE 1 TABLET UNDER THE TONGUE AT ONSET OF CHEST PAIN EVERY 5 MINTUES UP TO 3 TIMES AS NEEDED 10/29/21  Yes Almyra Deforest, PA  ?torsemide (DEMADEX) 20 MG tablet  Take 1 tablet (20 mg total) by mouth daily. 12/21/21  Yes Minus Breeding, MD  ?benzonatate (TESSALON PERLES) 100 MG capsule Take 1 capsule (100 mg total) by mouth 3 (three) times daily as needed. ?Patient not taking: Reported on 12/12/2021 09/26/21   Janora Norlander, DO  ?guaiFENesin-codeine (CHERATUSSIN AC) 100-10 MG/5ML syrup Take 5 mLs by mouth every 4 (four) hours as needed for cough. ?Patient not taking: Reported on 12/12/2021 10/03/21   Claretta Fraise, MD  ? ? ?Allergies:   Lipitor [atorvastatin]  ? ? ?ROS:  Please see the history of present illness.   Otherwise, review of systems are positive for none.   All other  systems are reviewed and negative.  ? ? ?PHYSICAL EXAM: ?VS:  BP 98/60   Pulse 69   Ht '5\' 3"'$  (1.6 m)   Wt 147 lb (66.7 kg)   BMI 26.04 kg/m?  , BMI Body mass index is 26.04 kg/m?. ?GENERAL:  Well appearing ?NECK:  No jugular venous distention, waveform within normal limits, carotid upstroke brisk and symmetric, no bruits, no thyromegaly ?LUNGS:  Clear to auscultation bilaterally ?CHEST:  Unremarkable ?HEART:  PMI not displaced or sustained,S1 and S2 within normal limits, no S3, no clicks, no rubs, 3 out of 6 apical systolic murmur radiating at the aortic outflow tract, no diastolic murmurs, irregular ?ABD:  Flat, positive bowel sounds normal in frequency in pitch, no bruits, no rebound, no guarding, no midline pulsatile mass, no hepatomegaly, no splenomegaly ?EXT:  2 plus pulses throughout, moderately severe bilateral lower extremity edema, no cyanosis no clubbing, no weeping, small various healed excoriations ? ?EKG:  EKG is  ordered today. ?Atrial fibrillation, right bundle branch block, no acute ST-T wave changes. ? ?Recent Labs: ?10/07/2021: Magnesium 2.0; TSH 0.915 ?10/08/2021: ALT 13; Hemoglobin 10.2; Platelets 255 ?12/31/2021: BUN 54; Creatinine, Ser 1.66; Potassium 5.0; Sodium 138  ? ? ?Lipid Panel ?   ?Component Value Date/Time  ? CHOL 94 (L) 10/03/2021 0952  ? CHOL 106 05/27/2013 0818   ? TRIG 66 10/03/2021 0952  ? TRIG 81 10/29/2016 0815  ? TRIG 76 05/27/2013 0818  ? HDL 45 10/03/2021 0952  ? HDL 54 10/29/2016 0815  ? HDL 62 05/27/2013 0818  ? CHOLHDL 2.1 10/03/2021 0952  ? CHOLHDL 2.8 12/28/

## 2022-01-09 ENCOUNTER — Other Ambulatory Visit: Payer: Self-pay | Admitting: *Deleted

## 2022-01-09 ENCOUNTER — Ambulatory Visit (INDEPENDENT_AMBULATORY_CARE_PROVIDER_SITE_OTHER): Payer: Medicare Other | Admitting: Cardiology

## 2022-01-09 ENCOUNTER — Other Ambulatory Visit: Payer: Self-pay

## 2022-01-09 ENCOUNTER — Encounter: Payer: Self-pay | Admitting: Cardiology

## 2022-01-09 VITALS — BP 98/60 | HR 69 | Ht 63.0 in | Wt 147.0 lb

## 2022-01-09 DIAGNOSIS — I482 Chronic atrial fibrillation, unspecified: Secondary | ICD-10-CM

## 2022-01-09 DIAGNOSIS — M7989 Other specified soft tissue disorders: Secondary | ICD-10-CM | POA: Diagnosis not present

## 2022-01-09 DIAGNOSIS — Z79899 Other long term (current) drug therapy: Secondary | ICD-10-CM | POA: Diagnosis not present

## 2022-01-09 DIAGNOSIS — I350 Nonrheumatic aortic (valve) stenosis: Secondary | ICD-10-CM

## 2022-01-09 DIAGNOSIS — I5033 Acute on chronic diastolic (congestive) heart failure: Secondary | ICD-10-CM | POA: Diagnosis not present

## 2022-01-09 DIAGNOSIS — I2510 Atherosclerotic heart disease of native coronary artery without angina pectoris: Secondary | ICD-10-CM

## 2022-01-09 NOTE — Patient Instructions (Signed)
Medication Instructions:  ?The current medical regimen is effective;  continue present plan and medications. ? ?*If you need a refill on your cardiac medications before your next appointment, please call your pharmacy* ? ?Lab Work: ?Please have blood work in 1 week (BMP) at [facility]. ? ?If you have labs (blood work) drawn today and your tests are completely normal, you will receive your results only by: ?MyChart Message (if you have MyChart) OR ?A paper copy in the mail ?If you have any lab test that is abnormal or we need to change your treatment, we will call you to review the results. ? ?Follow-Up: ?At [facility], you and your health needs are our priority.  As part of our continuing mission to provide you with exceptional heart care, we have created designated Provider Care Teams.  These Care Teams include your primary Cardiologist (physician) and Advanced Practice Providers (APPs -  Physician Assistants and Nurse Practitioners) who all work together to provide you with the care you need, when you need it. ? ?We recommend signing up for the patient portal called "MyChart".  Sign up information is provided on this After Visit Summary.  MyChart is used to connect with patients for Virtual Visits (Telemedicine).  Patients are able to view lab/test results, encounter notes, upcoming appointments, etc.  Non-urgent messages can be sent to your provider as well.   ?To learn more about what you can do with MyChart, go to [email or web address].   ? ?Your next appointment:   ?3 month(s) ? ?The format for your next appointment:   ?In Person ? ?Provider:   ?Minus Breeding, MD  ? ?Thank you for choosing [facility]!! ? ? ? ?

## 2022-01-16 ENCOUNTER — Other Ambulatory Visit: Payer: Medicare Other

## 2022-01-16 DIAGNOSIS — I350 Nonrheumatic aortic (valve) stenosis: Secondary | ICD-10-CM | POA: Diagnosis not present

## 2022-01-16 DIAGNOSIS — I482 Chronic atrial fibrillation, unspecified: Secondary | ICD-10-CM | POA: Diagnosis not present

## 2022-01-16 DIAGNOSIS — Z79899 Other long term (current) drug therapy: Secondary | ICD-10-CM | POA: Diagnosis not present

## 2022-01-16 DIAGNOSIS — I5033 Acute on chronic diastolic (congestive) heart failure: Secondary | ICD-10-CM | POA: Diagnosis not present

## 2022-01-17 ENCOUNTER — Encounter: Payer: Self-pay | Admitting: *Deleted

## 2022-01-17 LAB — BASIC METABOLIC PANEL
BUN/Creatinine Ratio: 24 (ref 10–24)
BUN: 37 mg/dL — ABNORMAL HIGH (ref 10–36)
CO2: 23 mmol/L (ref 20–29)
Calcium: 9.8 mg/dL (ref 8.6–10.2)
Chloride: 99 mmol/L (ref 96–106)
Creatinine, Ser: 1.56 mg/dL — ABNORMAL HIGH (ref 0.76–1.27)
Glucose: 104 mg/dL — ABNORMAL HIGH (ref 70–99)
Potassium: 4.6 mmol/L (ref 3.5–5.2)
Sodium: 139 mmol/L (ref 134–144)
eGFR: 40 mL/min/{1.73_m2} — ABNORMAL LOW (ref >59)

## 2022-02-05 DIAGNOSIS — H353132 Nonexudative age-related macular degeneration, bilateral, intermediate dry stage: Secondary | ICD-10-CM | POA: Diagnosis not present

## 2022-02-05 DIAGNOSIS — Z961 Presence of intraocular lens: Secondary | ICD-10-CM | POA: Diagnosis not present

## 2022-02-28 ENCOUNTER — Telehealth: Payer: Medicare Other

## 2022-04-03 ENCOUNTER — Ambulatory Visit: Payer: Medicare Other | Admitting: Family Medicine

## 2022-04-03 ENCOUNTER — Ambulatory Visit (INDEPENDENT_AMBULATORY_CARE_PROVIDER_SITE_OTHER): Payer: Medicare Other | Admitting: Family Medicine

## 2022-04-03 ENCOUNTER — Encounter: Payer: Self-pay | Admitting: Family Medicine

## 2022-04-03 VITALS — BP 108/68 | HR 53 | Temp 97.7°F | Ht 63.0 in | Wt 152.6 lb

## 2022-04-03 DIAGNOSIS — E559 Vitamin D deficiency, unspecified: Secondary | ICD-10-CM | POA: Diagnosis not present

## 2022-04-03 DIAGNOSIS — E039 Hypothyroidism, unspecified: Secondary | ICD-10-CM | POA: Diagnosis not present

## 2022-04-03 DIAGNOSIS — F0280 Dementia in other diseases classified elsewhere without behavioral disturbance: Secondary | ICD-10-CM | POA: Diagnosis not present

## 2022-04-03 DIAGNOSIS — G309 Alzheimer's disease, unspecified: Secondary | ICD-10-CM | POA: Diagnosis not present

## 2022-04-03 DIAGNOSIS — I2510 Atherosclerotic heart disease of native coronary artery without angina pectoris: Secondary | ICD-10-CM

## 2022-04-03 MED ORDER — DONEPEZIL HCL 5 MG PO TABS: 5.0000 mg | ORAL_TABLET | Freq: Every day | ORAL | 1 refills | Status: DC

## 2022-04-03 NOTE — Progress Notes (Signed)
Subjective:  Patient ID: Frederick Davis, male    DOB: 09-30-1923  Age: 88 y.o. MRN: [id number]  CC: Medical Management of Chronic Issues   HPI Frederick Davis presents for  follow-up on  thyroid. The patient has a history of hypothyroidism for many years. It has been stable recently. Pt. denies any change in  voice, loss of hair, heat or cold intolerance. Energy level has been adequate to good. Patient denies constipation and diarrhea. No myxedema. Medication is as noted below. Verified that pt is taking it daily on an empty stomach. Well tolerated.  Falling frequently. Using a walker.   Getting more forgetful. Doesn't remember falling. (Wife describes four different episodes.)      04/03/2022   10:01 AM 04/03/2022    9:49 AM 10/10/2021    2:26 PM  Depression screen PHQ 2/9  Decreased Interest 0 0 1  Down, Depressed, Hopeless 0 0 1  PHQ - 2 Score 0 0 2  Altered sleeping   1  Tired, decreased energy   2  Change in appetite   0  Feeling bad or failure about yourself    0  Trouble concentrating   1  Moving slowly or fidgety/restless   2  Suicidal thoughts   0  PHQ-9 Score   8  Difficult doing work/chores   Somewhat difficult    History Frederick Davis has a past medical history of Anemia, Aortic stenosis, Arthritis, CAD (coronary artery disease), Chronic renal insufficiency, stage III (moderate) (HCC), Dyspnea, Esophageal stricture (YRS AGO), Hypertension, Hypothyroidism, Kidney stone (1969), MI (myocardial infarction) ([place]) (2017), Mitral regurgitation, Myocardial infarction ([place]) (03/12/2013), Osteoporosis, PC (prostate cancer) ([place]) (1987), RBBB, Rectum cancer ([place]) (1986), and Renal cell cancer ([place]) (2005).   He has a past surgical history that includes Kidney surgery (Right, 2004); Radiofrequency ablation kidney (2006); renal sphincter appliance (2007); Colonoscopy (01/08/2012); Colostomy (Left, 1986); Percutaneous coronary intervention-balloon only; left heart catheterization with  coronary angiogram (N/A, 03/12/2013); Prostatectomy; Colonoscopy (N/A, 07/20/2015); Cardiac catheterization (N/A, 12/06/2015); Coronary artery bypass graft (N/A, 12/08/2015); TEE without cardioversion (N/A, 12/08/2015); Hernia repair; Removal of penile prosthesis (N/A, 12/15/2017); and Removal of penile prosthesis (N/A, 02/23/2018).   His family history includes AAA (abdominal aortic aneurysm) (age of onset: 80) in his father; Arthritis in his father; Cancer in his son; Hypertension in his son; Parkinson's disease in his sister; Stroke (age of onset: 84) in his mother.He reports that he quit smoking about 53 years ago. His smoking use included cigarettes. He started smoking about 75 years ago. He has a 5.00 pack-year smoking history. He has never been exposed to tobacco smoke. He has never used smokeless tobacco. He reports current alcohol use of about 1.0 standard drink of alcohol per week. He reports that he does not use drugs.    ROS Review of Systems  Constitutional:  Negative for fever.  Eyes:  Positive for redness (for 3-4 days upper lid has mild redness, getting better. Vision not affected).  Respiratory:  Negative for shortness of breath.   Cardiovascular:  Negative for chest pain.  Musculoskeletal:  Negative for arthralgias.  Skin:  Negative for rash.    Objective:  BP 108/68   Pulse (!) 53   Temp 97.7 F (36.5 C)   Ht '5\' 3"'  (1.6 m)   Wt 152 lb 9.6 oz (69.2 kg)   SpO2 98%   BMI 27.03 kg/m   BP Readings from Last 3 Encounters:  04/03/22 108/68  01/09/22 98/60  12/12/21 100/70  Wt Readings from Last 3 Encounters:  04/03/22 152 lb 9.6 oz (69.2 kg)  01/09/22 147 lb (66.7 kg)  12/12/21 148 lb (67.1 kg)     Physical Exam Vitals reviewed.  Constitutional:      Appearance: He is well-developed.  HENT:     Head: Normocephalic and atraumatic.     Right Ear: External ear normal.     Left Ear: External ear normal.     Mouth/Throat:     Pharynx: No oropharyngeal exudate or  posterior oropharyngeal erythema.  Eyes:     Pupils: Pupils are equal, round, and reactive to light.  Cardiovascular:     Rate and Rhythm: Normal rate and regular rhythm.     Heart sounds: No murmur heard. Pulmonary:     Effort: No respiratory distress.     Breath sounds: Normal breath sounds.  Musculoskeletal:     Cervical back: Normal range of motion and neck supple.  Neurological:     Mental Status: He is alert and oriented to person, place, and time.  Psychiatric:        Mood and Affect: Mood normal.        Behavior: Behavior normal.       Assessment & Plan:   Lyman was seen today for medical management of chronic issues.  Diagnoses and all orders for this visit:  Hypothyroidism, unspecified type -     CBC with Differential/Platelet -     CMP14+EGFR -     TSH + free T4  Vitamin D deficiency -     VITAMIN D 25 Hydroxy (Vit-D Deficiency, Fractures)  Alzheimer disease (HCC) -     donepezil (ARICEPT) 5 MG tablet; Take 1 tablet (5 mg total) by mouth at bedtime.       I have discontinued [name]. Riordan's benzonatate and Cheratussin AC. I am also having him start on donepezil. Additionally, I am having him maintain his multivitamin, lipase/protease/amylase, Multiple Vitamins-Minerals (ZINC PO), carvedilol, levothyroxine, meclizine, nitroGLYCERIN, aspirin EC, and torsemide.  Allergies as of 04/03/2022       Reactions   Lipitor [atorvastatin] Other (See Comments)   Muscle aches        Medication List        Accurate as of April 03, 2022 10:41 AM. If you have any questions, ask your nurse or doctor.          STOP taking these medications    benzonatate 100 MG capsule Commonly known as: Best boy Stopped by: Claretta Fraise, MD   Cheratussin AC 100-10 MG/5ML syrup Generic drug: guaiFENesin-codeine Stopped by: Claretta Fraise, MD       TAKE these medications    aspirin EC 81 MG tablet Take 1 tablet (81 mg total) by mouth daily. Swallow  whole.   carvedilol 3.125 MG tablet Commonly known as: COREG TAKE 1 TABLET TWICE A DAY WITH MEALS (BREAKFAST AND SUPPER)   donepezil 5 MG tablet Commonly known as: Aricept Take 1 tablet (5 mg total) by mouth at bedtime. Started by: Claretta Fraise, MD   levothyroxine 75 MCG tablet Commonly known as: SYNTHROID Take 1 tablet (75 mcg total) by mouth daily.   lipase/protease/amylase 36000 UNITS Cpep capsule Commonly known as: CREON Take 1 capsule by mouth at the first bite of each meal, and 1 capsule during meal, and 1 capsule at the last bite of a meal.  Allow for 3 meals per day.  If eating snacks take 1 capsule with snack.   meclizine 25 MG  tablet Commonly known as: ANTIVERT Take 25 mg by mouth 3 (three) times daily as needed for dizziness.   multivitamin capsule Take 1 capsule daily by mouth.   nitroGLYCERIN 0.4 MG SL tablet Commonly known as: NITROSTAT PLACE 1 TABLET UNDER THE TONGUE AT ONSET OF CHEST PAIN EVERY 5 MINTUES UP TO 3 TIMES AS NEEDED   torsemide 20 MG tablet Commonly known as: DEMADEX Take 1 tablet (20 mg total) by mouth daily.   ZINC PO Take by mouth at bedtime.       We reviewed home safety procedures including avoiding steps.  There are 4 steps in their basement that he has to traverse to get to his car.  We reviewed safety techniques for this.  He is also had a physical therapist work with him on this.  However, other than this he should avoid stairs.  Specifically he needs to avoid trying to go down the 14 stairs to his basement.  Additionally it is clear that although he is conversant he does not have good short to medium term memory.  We will go ahead and start treatment for what appears to be early Alzheimer's.  Follow-up: Return in about 6 weeks (around 05/15/2022) for memory.  Claretta Fraise, [name]

## 2022-04-04 ENCOUNTER — Other Ambulatory Visit: Payer: Self-pay | Admitting: *Deleted

## 2022-04-04 ENCOUNTER — Other Ambulatory Visit: Payer: Self-pay | Admitting: Family Medicine

## 2022-04-04 DIAGNOSIS — E039 Hypothyroidism, unspecified: Secondary | ICD-10-CM

## 2022-04-04 LAB — CMP14+EGFR
ALT: 16 IU/L (ref 0–44)
AST: 19 IU/L (ref 0–40)
Albumin/Globulin Ratio: 1.5 (ref 1.2–2.2)
Albumin: 3.8 g/dL (ref 3.5–4.6)
Alkaline Phosphatase: 93 IU/L (ref 44–121)
BUN/Creatinine Ratio: 25 — ABNORMAL HIGH (ref 10–24)
BUN: 39 mg/dL — ABNORMAL HIGH (ref 10–36)
Bilirubin Total: 0.5 mg/dL (ref 0.0–1.2)
CO2: 22 mmol/L (ref 20–29)
Calcium: 9.6 mg/dL (ref 8.6–10.2)
Chloride: 99 mmol/L (ref 96–106)
Creatinine, Ser: 1.58 mg/dL — ABNORMAL HIGH (ref 0.76–1.27)
Globulin, Total: 2.6 g/dL (ref 1.5–4.5)
Glucose: 83 mg/dL (ref 70–99)
Potassium: 4.4 mmol/L (ref 3.5–5.2)
Sodium: 136 mmol/L (ref 134–144)
Total Protein: 6.4 g/dL (ref 6.0–8.5)
eGFR: 40 mL/min/{1.73_m2} — ABNORMAL LOW (ref >59)

## 2022-04-04 LAB — CBC WITH DIFFERENTIAL/PLATELET
Basophils Absolute: 0.1 10*3/uL (ref 0.0–0.2)
Basos: 1 %
EOS (ABSOLUTE): 0.3 10*3/uL (ref 0.0–0.4)
Eos: 4 %
Hematocrit: 32.5 % — ABNORMAL LOW (ref 37.5–51.0)
Hemoglobin: 10.3 g/dL — ABNORMAL LOW (ref 13.0–17.7)
Immature Grans (Abs): 0 10*3/uL (ref 0.0–0.1)
Immature Granulocytes: 0 %
Lymphocytes Absolute: 1.9 10*3/uL (ref 0.7–3.1)
Lymphs: 24 %
MCH: 25 pg — ABNORMAL LOW (ref 26.6–33.0)
MCHC: 31.7 g/dL (ref 31.5–35.7)
MCV: 79 fL (ref 79–97)
Monocytes Absolute: 0.7 10*3/uL (ref 0.1–0.9)
Monocytes: 8 %
Neutrophils Absolute: 5.1 10*3/uL (ref 1.4–7.0)
Neutrophils: 63 %
Platelets: 232 10*3/uL (ref 150–450)
RBC: 4.12 x10E6/uL — ABNORMAL LOW (ref 4.14–5.80)
RDW: 14.7 % (ref 11.6–15.4)
WBC: 8.1 10*3/uL (ref 3.4–10.8)

## 2022-04-04 LAB — TSH+FREE T4
Free T4: 1.28 ng/dL (ref 0.82–1.77)
TSH: 5.3 u[IU]/mL — ABNORMAL HIGH (ref 0.450–4.500)

## 2022-04-04 LAB — VITAMIN D 25 HYDROXY (VIT D DEFICIENCY, FRACTURES): Vit D, 25-Hydroxy: 40.7 ng/mL (ref 30.0–100.0)

## 2022-04-04 MED ORDER — LEVOTHYROXINE SODIUM 88 MCG PO TABS: 88.0000 ug | ORAL_TABLET | Freq: Every day | ORAL | 1 refills | Status: DC

## 2022-04-16 ENCOUNTER — Ambulatory Visit: Payer: Medicare Other

## 2022-04-29 NOTE — Progress Notes (Unsigned)
Cardiology Office Note   Date:  05/01/2022   ID:  Frederick Davis, DOB [birth year], MRN [id number]  PCP:  Claretta Fraise, MD  Cardiologist:   Minus Breeding, MD   Chief Complaint  Patient presents with   Edema      History of Present Illness: Frederick Davis is a 88 y.o. male who for followup after a acute inferior wall infarct in May of 2014. He had thrombectomy of a right coronary artery occlusion. He was initially acute respiratory failure and was cooled. However, he responded very well to therapy.    He underwent a CABG 3 on February 2017.     He was admitted with syncopal episode related to aortic valve stenosis and new onset atrial fibrillation.  He has remained rate controlled with his carvedilol, but given his high CHA2DS2-VASc score of 4 he was recommended to start on Eliquis.  He has had 2D echocardiogram with LVEF 55% and no wall motion abnormalities noted.   I reviewed these records for this visit.    He called last week and had increased swelling and weight gain.   At the last visit I changed Lasix to Demadex.  I did stop his Eliquis because of almost continuous nosebleeds.  He is not having any new complaints although he does say that when he sits in his chair in his living room and his family walks the room he will fall asleep.  It does not sound at all like syncope.  He does not have any events when he is up and moving around.  He has had some increased lower extremity swelling and has a small wound on his right leg that is healing.  He denies any new shortness of breath, PND or orthopnea.  He had no new palpitations, presyncope or syncope.  He gets around slowly and tries not to use his walker to our chagrin.   Past Medical History:  Diagnosis Date   Anemia    Aortic stenosis    Moderate // Echo 05/2019: EF 60-65, asymmetric septal LVH, mildly reduced RVSF, RVSP 37.4, mod RAE, severe LAE, mod TR, mod AS (V-max 280 cm/s, mean gradient 23 mmHg, DI 0.35), aortic root  38   Arthritis    left knee    CAD (coronary artery disease)    a. RCA thrombectomy 2014. b. CABGx3 in 11/2015 (EF 45-50% in 11/2015).   Chronic renal insufficiency, stage III (moderate) (HCC)    only has 0ne kidney   Dyspnea    WITH EXERTION   Esophageal stricture YRS AGO   Hypertension    Hypothyroidism    Kidney stone 1969   MI (myocardial infarction) ([place]) 2017   Mitral regurgitation    a. mild by TEE 11/2015.   Myocardial infarction ([place]) 03/12/2013   Inferior MI.  LAD mid 80% stenosis, circ mild plaque, RCA 100% with thrombectomy PTCA.  EF 65%   Osteoporosis    PC (prostate cancer) ([place]) 1987   RBBB    Rectum cancer ([place]) 1986   Renal cell cancer ([place]) 2005    Past Surgical History:  Procedure Laterality Date   CARDIAC CATHETERIZATION N/A 12/06/2015   Procedure: Left Heart Cath and Coronary Angiography;  Surgeon: Burnell Blanks, MD;  Location: [facility];  Service: Cardiovascular;  Laterality: N/A;   COLONOSCOPY  01/08/2012   Procedure: COLONOSCOPY;  Surgeon: Rogene Houston, MD;  [facility]: [facility];  Service: Endoscopy;  Laterality: N/A;  1030  COLONOSCOPY N/A 07/20/2015   Procedure: COLONOSCOPY;  Surgeon: Rogene Houston, MD;  [facility]: [facility];  Service: Endoscopy;  Laterality: N/A;  930 - moved to 9/29 @ 2:00   COLOSTOMY Left 1986   colon cancer   CORONARY ARTERY BYPASS GRAFT N/A 12/08/2015   Procedure: CORONARY ARTERY BYPASS GRAFTING (CABG);  Surgeon: Melrose Nakayama, MD;  Location: [facility];  Service: Open Heart Surgery;  Laterality: N/A;  Times 3 using left internal mammary artery and endoscopycally harvested bilateral saphenous vein.   HERNIA REPAIR     2007 RIGHT INGUINAL HERNIA   KIDNEY SURGERY Right 2004   LEFT HEART CATHETERIZATION WITH CORONARY ANGIOGRAM N/A 03/12/2013   Procedure: STEMI;  Surgeon: Burnell Blanks, MD;  Location: [facility];  Service: Cardiovascular;  Laterality: N/A;   PERCUTANEOUS CORONARY  INTERVENTION-BALLOON ONLY     PROSTATECTOMY     RADIOFREQUENCY ABLATION KIDNEY  2006   REMOVAL OF PENILE PROSTHESIS N/A 12/15/2017   Procedure: REMOVAL OF PENILE PROSTHESIS;  Surgeon: Franchot Gallo, MD;  Location: [facility];  Service: Urology;  Laterality: N/A;   REMOVAL OF PENILE PROSTHESIS N/A 02/23/2018   Procedure: [name] OF ERODED SPHINCTER;  Surgeon: Franchot Gallo, MD;  Location: [facility];  Service: Urology;  Laterality: N/A;   renal sphincter appliance  2007   TEE WITHOUT CARDIOVERSION N/A 12/08/2015   Procedure: TRANSESOPHAGEAL ECHOCARDIOGRAM (TEE);  Surgeon: Melrose Nakayama, MD;  Location: [facility];  Service: Open Heart Surgery;  Laterality: N/A;   Prior to Admission medications   Medication Sig Start Date End Date Taking? Authorizing Provider  aspirin EC 81 MG tablet Take 1 tablet (81 mg total) by mouth daily. Swallow whole. 12/12/21  Yes Minus Breeding, MD  carvedilol (COREG) 3.125 MG tablet TAKE 1 TABLET TWICE A DAY WITH MEALS (BREAKFAST AND SUPPER) 10/03/21  Yes Claretta Fraise, MD  levothyroxine (SYNTHROID) 75 MCG tablet Take 1 tablet (75 mcg total) by mouth daily. 10/03/21  Yes Claretta Fraise, MD  lipase/protease/amylase (CREON) 36000 UNITS CPEP capsule Take 1 capsule by mouth at the first bite of each meal, and 1 capsule during meal, and 1 capsule at the last bite of a meal.  Allow for 3 meals per day.  If eating snacks take 1 capsule with snack. 11/02/19  Yes [provider]  meclizine (ANTIVERT) 25 MG tablet Take 25 mg by mouth 3 (three) times daily as needed for dizziness.   Yes [provider]  Multiple Vitamin (MULTIVITAMIN) capsule Take 1 capsule daily by mouth.   Yes [provider]  Multiple Vitamins-Minerals (ZINC PO) Take by mouth at bedtime.   Yes [provider]  nitroGLYCERIN (NITROSTAT) 0.4 MG SL tablet PLACE 1 TABLET UNDER THE TONGUE AT ONSET OF CHEST PAIN EVERY 5 MINTUES UP TO 3 TIMES AS NEEDED 10/29/21  Yes Almyra Deforest, PA   torsemide (DEMADEX) 20 MG tablet Take 1 tablet (20 mg total) by mouth daily. 12/21/21  Yes Minus Breeding, MD  benzonatate (TESSALON PERLES) 100 MG capsule Take 1 capsule (100 mg total) by mouth 3 (three) times daily as needed. Patient not taking: Reported on 12/12/2021 09/26/21   Janora Norlander, DO  guaiFENesin-codeine (CHERATUSSIN AC) 100-10 MG/5ML syrup Take 5 mLs by mouth every 4 (four) hours as needed for cough. Patient not taking: Reported on 12/12/2021 10/03/21   Claretta Fraise, MD    Allergies:   Lipitor [atorvastatin]    ROS:  Please see the history of present illness.   Otherwise, review  of systems are positive for none.   All other systems are reviewed and negative.    PHYSICAL EXAM: VS:  BP 98/62   Pulse 61   Ht '5\' 3"'$  (1.6 m)   Wt 153 lb (69.4 kg)   BMI 27.10 kg/m  , BMI Body mass index is 27.1 kg/m. GENERAL:  Well appearing for his age NECK:  No jugular venous distention, waveform within normal limits, carotid upstroke brisk and symmetric, no bruits, no thyromegaly LUNGS:  Clear to auscultation bilaterally CHEST:  Unremarkable HEART:  PMI not displaced or sustained,S1 and S2 within normal limits, no S3, no clicks, no rubs, no murmurs, irregular  ABD:  Flat, positive bowel sounds normal in frequency in pitch, no bruits, no rebound, no guarding, no midline pulsatile mass, no hepatomegaly, no splenomegaly EXT:  2 plus pulses throughout, moderate bilateral right greater than left leg edema, no cyanosis no clubbing   EKG:  EKG is  ordered today. Atrial fibrillation, rate 69, right bundle branch block, leftward axis, no acute ST-T wave changes.  Recent Labs: 10/07/2021: Magnesium 2.0 04/03/2022: ALT 16; BUN 39; Creatinine, Ser 1.58; Hemoglobin 10.3; Platelets 232; Potassium 4.4; Sodium 136; TSH 5.300    Lipid Panel    Component Value Date/Time   CHOL 94 (L) 10/03/2021 0952   CHOL 106 05/27/2013 0818   TRIG 66 10/03/2021 0952   TRIG 81 10/29/2016 0815   TRIG 76  05/27/2013 0818   HDL 45 10/03/2021 0952   HDL 54 10/29/2016 0815   HDL 62 05/27/2013 0818   CHOLHDL 2.1 10/03/2021 0952   CHOLHDL 2.8 10/18/2015 0530   VLDL 19 10/18/2015 0530   LDLCALC 34 10/03/2021 0952   LDLCALC 29 08/09/2014 0854   LDLCALC 29 05/27/2013 0818      Wt Readings from Last 3 Encounters:  05/01/22 153 lb (69.4 kg)  04/30/22 150 lb (68 kg)  04/03/22 152 lb 9.6 oz (69.2 kg)      Other studies Reviewed: Additional studies/ records that were reviewed today include: Labs Review of the above records demonstrates: See elsewhere   ASSESSMENT AND PLAN:  ATRIAL FIB:   He has good rate control.  He did not tolerate anticoagulation.  His age precludes watchman.  No change in therapy.  CAD/CABG:    He is having no angina.  No change in therapy.  HTN:   His blood pressure is running low.  I am going to discontinue his carvedilol  DYSLIPIDEMIA:   I would like to avoid statins as we are trying to minimize therapy.  His December LDL was actually only 34.  AORTIC STENOSIS:   This is severe but given his age we are managing this medically.  CKD IIIB: Creatinine is 1.58 which is lower than previous.  No change in therapy.   LEG SWELLING: I would like to avoid more diuretics.  I had a very firm conversation with him about keeping his feet elevated which he would be able to do if he would comply.    Current medicines are reviewed at length with the patient today.  The patient does not have concerns regarding medicines.  The following changes have been made: As above  Labs/ tests ordered today include:  None  Orders Placed This Encounter  Procedures   EKG 12-Lead     Disposition:   FU with me in 4 months     Signed, Minus Breeding, MD  05/01/2022 2:10 PM    [facility]

## 2022-04-30 ENCOUNTER — Ambulatory Visit (INDEPENDENT_AMBULATORY_CARE_PROVIDER_SITE_OTHER): Payer: Medicare Other

## 2022-04-30 VITALS — Wt 150.0 lb

## 2022-04-30 DIAGNOSIS — Z0000 Encounter for general adult medical examination without abnormal findings: Secondary | ICD-10-CM

## 2022-04-30 NOTE — Progress Notes (Signed)
Subjective:   Frederick Davis is a 88 y.o. male who presents for Medicare Annual/Subsequent preventive examination.  Virtual Visit via Telephone Note  I connected with  Frederick Davis on 04/30/22 at  2:00 PM EDT by telephone and verified that I am speaking with the correct person using two identifiers.  Location: Patient: Home Provider: [facility] Persons participating in the virtual visit: patient/Nurse Health Advisor   I discussed the limitations, risks, security and privacy concerns of performing an evaluation and management service by telephone and the availability of in person appointments. The patient expressed understanding and agreed to proceed.  Interactive audio and video telecommunications were attempted between this nurse and patient, however failed, due to patient having technical difficulties OR patient did not have access to video capability.  We continued and completed visit with audio only.  Some vital signs may be absent or patient reported.   Garcia Dalzell E Vartan Kerins, LPN   Review of Systems     Cardiac Risk Factors include: advanced age (>37mn, >>12women);male gender;dyslipidemia;hypertension;sedentary lifestyle;Other (see comment), Risk factor comments: hx of MI, CAD, A.FIB, aortic stenosis, CHF     Objective:    Today's Vitals   04/30/22 1400  Weight: 150 lb (68 kg)   Body mass index is 26.57 kg/m.     04/30/2022    2:08 PM 10/08/2021    1:35 AM 10/07/2021    1:19 PM 04/13/2021    9:38 AM 04/12/2020    9:48 AM 04/12/2019    9:38 AM 06/07/2018   11:16 AM  Advanced Directives  Does Patient Have a Medical Advance Directive? Yes No Yes Yes Yes No No  Type of AParamedicof A[place]Living will   [place] will [place] will    Does patient want to make changes to medical advance directive?     No - Patient declined    Copy of HContra [place] Chart? No - copy requested   No  - copy requested No - copy requested    Would patient like information on creating a medical advance directive?  No - Patient declined    Yes (MAU/Ambulatory/Procedural Areas - Information given) No - Patient declined    Current Medications (verified) Outpatient Encounter Medications as of 04/30/2022  Medication Sig   aspirin EC 81 MG tablet Take 1 tablet (81 mg total) by mouth daily. Swallow whole.   carvedilol (COREG) 3.125 MG tablet TAKE 1 TABLET TWICE A DAY WITH MEALS (BREAKFAST AND SUPPER)   donepezil (ARICEPT) 5 MG tablet Take 1 tablet (5 mg total) by mouth at bedtime.   levothyroxine (SYNTHROID) 88 MCG tablet Take 1 tablet (88 mcg total) by mouth daily.   lipase/protease/amylase (CREON) 36000 UNITS CPEP capsule Take 1 capsule by mouth at the first bite of each meal, and 1 capsule during meal, and 1 capsule at the last bite of a meal.  Allow for 3 meals per day.  If eating snacks take 1 capsule with snack.   meclizine (ANTIVERT) 25 MG tablet Take 25 mg by mouth 3 (three) times daily as needed for dizziness.   Multiple Vitamin (MULTIVITAMIN) capsule Take 1 capsule daily by mouth.   Multiple Vitamins-Minerals (ZINC PO) Take by mouth at bedtime.   nitroGLYCERIN (NITROSTAT) 0.4 MG SL tablet PLACE 1 TABLET UNDER THE TONGUE AT ONSET OF CHEST PAIN EVERY 5 MINTUES UP TO 3 TIMES AS NEEDED   torsemide (DEMADEX) 20 MG tablet Take 1 tablet (  20 mg total) by mouth daily.   Facility-Administered Encounter Medications as of 04/30/2022  Medication   etomidate (AMIDATE) injection   lidocaine (cardiac) 100 mg/16m (XYLOCAINE) 20 MG/ML injection 2%   succinylcholine (ANECTINE) injection    Allergies (verified) Lipitor [atorvastatin]   History: Past Medical History:  Diagnosis Date   Anemia    Aortic stenosis    Moderate // Echo 05/2019: EF 60-65, asymmetric septal LVH, mildly reduced RVSF, RVSP 37.4, mod RAE, severe LAE, mod TR, mod AS (V-max 280 cm/s, mean gradient 23 mmHg, DI 0.35), aortic root 38    Arthritis    left knee    CAD (coronary artery disease)    a. RCA thrombectomy 2014. b. CABGx3 in 11/2015 (EF 45-50% in 11/2015).   Chronic renal insufficiency, stage III (moderate) (HCC)    only has 0ne kidney   Dyspnea    WITH EXERTION   Esophageal stricture YRS AGO   Hypertension    Hypothyroidism    Kidney stone 1969   MI ([place]ocardial infarction) ([facility] [facility] [place]   Mitral regurgitation    a. mild by TEE 11/2015.   Myocardial infarction ([facility] 03/12/2013   Inferior MI.  LAD mid 80% stenosis, circ mild plaque, RCA 100% with thrombectomy PTCA.  EF 65%   Osteoporosis    PC (prostate cancer) ([facility] 1987   RBBB    Rectum cancer ([facility] 1986   [place] cell cancer [place] 2005   Past Surgical History:  Procedure Laterality Date   CARDIAC CATHETERIZATION N/A 12/06/2015   Procedure: Left Heart Cath and Coronary Angiography;  Surgeon: CBurnell Blanks MD;  [facility];  Service: Cardiovascular;  Laterality: N/A;   COLONOSCOPY  01/08/2012   Procedure: COLONOSCOPY;  Surgeon: NRogene Houston MD;  [facility];  Service: Endoscopy;  Laterality: N/A;  1030   COLONOSCOPY N/A 07/20/2015   Procedure: COLONOSCOPY;  Surgeon: NRogene Houston MD;  [facility];  Service: Endoscopy;  Laterality: N/A;  930 - moved to 9/29 @ 2:00   COLOSTOMY Left 1986   colon cancer   CORONARY ARTERY BYPASS GRAFT N/A 12/08/2015   Procedure: CORONARY ARTERY BYPASS GRAFTING (CABG);  Surgeon: SMelrose Nakayama MD;  [facility]  Service: Open Heart Surgery;  Laterality: N/A;  Times 3 using left internal mammary artery and endoscopycally harvested bilateral saphenous vein.   HERNIA REPAIR     2007 RIGHT INGUINAL HERNIA   KIDNEY SURGERY Right 2004   LEFT HEART CATHETERIZATION WITH CORONARY ANGIOGRAM N/A 03/12/2013   Procedure: STEMI;  Surgeon: CBurnell Blanks MD;  [facility];  Service: Cardiovascular;  Laterality: N/A;   PERCUTANEOUS CORONARY INTERVENTION-BALLOON  ONLY     PROSTATECTOMY     RADIOFREQUENCY ABLATION KIDNEY  2006   REMOVAL OF PENILE PROSTHESIS N/A 12/15/2017   Procedure: REMOVAL OF PENILE PROSTHESIS;  Surgeon: DFranchot Gallo MD;  [facility];  Service: Urology;  Laterality: N/A;   REMOVAL OF PENILE PROSTHESIS N/A 02/23/2018   Procedure: EFloria RavelingOF ERODED SPHINCTER;  Surgeon: DFranchot Gallo MD;  [facility];  Service: Urology;  Laterality: N/A;   renal sphincter appliance  2007   TEE WITHOUT CARDIOVERSION N/A 12/08/2015   Procedure: TRANSESOPHAGEAL ECHOCARDIOGRAM (TEE);  Surgeon: SMelrose Nakayama MD;  [facility]  Service: Open Heart Surgery;  Laterality: N/A;   Family History  Problem Relation Age of Onset   Stroke Mother 895  AAA (abdominal aortic aneurysm) Father 719  died from rupture at 24 yo   Arthritis Father    Parkinson's disease Sister    Cancer Son        prostate cancer   Hypertension Son    Social History   Socioeconomic History   Marital status: Married    Spouse name: Mikle Bosworth   Number of children: 4   Years of education: 34   Highest education level: Professional school degree (e.g., MD, DDS, DVM, JD)  Occupational History   Occupation: pharmacist-retired   Occupation: preacher-retired  Tobacco Use   Smoking status: Former    Packs/day: 0.50    Years: 10.00    Total pack years: 5.00    Types: Cigarettes    Start date: 10/21/1946    Quit date: 10/21/1968    Years since quitting: 53.5    Passive exposure: Never   Smokeless tobacco: Never  Vaping Use   Vaping Use: Never used  Substance and Sexual Activity   Alcohol use: Yes    Alcohol/week: 1.0 standard drink of alcohol    Types: 1 Standard drinks or equivalent per week    Comment: RARE   Drug use: No   Sexual activity: Not Currently  Other Topics Concern   Not on file  Social History Narrative   Lives home with wife. One child in [place], others live in [place] Strain: Low Risk  (04/30/2022)   Overall Financial Resource Strain (CARDIA)    Difficulty of Paying Living Expenses: Not hard at all  Food Insecurity: No Food Insecurity (04/30/2022)   Hunger Vital Sign    Worried About Running Out of Food in the Last Year: Never true    Ran Out of Food in the Last Year: Never true  Transportation Needs: No Transportation Needs (04/30/2022)   PRAPARE - Hydrologist (Medical): No    Lack of Transportation (Non-Medical): No  Physical Activity: Insufficiently Active (04/30/2022)   Exercise Vital Sign    Days of Exercise per Week: 7 days    Minutes of Exercise per Session: 10 min  Stress: No Stress Concern Present (04/30/2022)   [place]    Feeling of Stress : Only a little  Social Connections: Socially Integrated (04/30/2022)   Social Connection and Isolation Panel [NHANES]    Frequency of Communication with Friends and Family: More than three times a week    Frequency of Social Gatherings with Friends and Family: More than three times a week    Attends Religious Services: More than 4 times per year    Active Member of [employer] or Organizations: Yes    Attends Music therapist: More than 4 times per year    Marital Status: Married    Tobacco Counseling Counseling given: Not Answered   Clinical Intake:  Pre-visit preparation completed: Yes  Pain : No/denies pain     BMI - recorded: 26.57 Nutritional Status: BMI 25 -29 Overweight Nutritional Risks: None Diabetes: No  How often do you need to have someone help you when you read instructions, pamphlets, or other written materials from your doctor or pharmacy?: 1 - Never  Diabetic? no  Interpreter Needed?: No  Information entered by :: Jolinda Pinkstaff, LPN   Activities of Daily Living    04/30/2022    2:08 PM 10/08/2021    1:35 AM  In your present state of health, do you have  any  difficulty performing the following activities:  Hearing? 0 0  Vision? 0 0  Difficulty concentrating or making decisions? 1 0  Walking or climbing stairs? 1 1  Dressing or bathing? 0 0  Doing errands, shopping? 0 1  Preparing Food and eating ? N   Using the Toilet? N   In the past six months, have you accidently leaked urine? N   Do you have problems with loss of bowel control? N   Managing your Medications? N   Managing your Finances? N   Housekeeping or managing your Housekeeping? N     Patient Care Team: Claretta Fraise, MD as PCP - General (Family Medicine) Minus Breeding, MD as PCP - Cardiology (Cardiology) Burman Foster, MD as Physician Assistant (Urology) Danella Sensing, MD as Consulting Physician (Dermatology) Gaynelle Arabian, MD as Consulting Physician (Orthopedic Surgery) Rogene Houston, MD as Consulting Physician (Gastroenterology) Shea Evans Norva Riffle, LCSW as [place] Management (Licensed Clinical Social Worker) Ilean China, RN as Case Manager  Indicate any recent [place] you may have received from other than [facility] providers in the past year (date may be approximate).     Assessment:   This is a routine wellness examination for [place].  Hearing/Vision screen Hearing Screening - Comments:: Denies hearing difficulties   Vision Screening - Comments:: Wears reading glasses prn - up to date with routine eye exams with [facility]  Dietary issues and exercise activities discussed: Current Exercise Habits: The patient does not participate in regular exercise at present, Exercise limited by: cardiac condition(s)   Goals Addressed             This Visit's Progress    DIET - INCREASE WATER INTAKE   On track    Try to drink 6-8 glasses of water daily     Prevent Falls   Not on track    And CONTINUE healthy lifestyle        Depression Screen    04/30/2022    2:07 PM 04/03/2022   10:01 AM 04/03/2022    9:49 AM 10/10/2021    2:26 PM  10/03/2021    8:52 AM 04/13/2021    9:35 AM 04/04/2021   10:25 AM  PHQ 2/9 Scores  PHQ - 2 Score 0 0 0 2 0 0 0  PHQ- 9 Score    8       Fall Risk    04/30/2022    2:03 PM 04/03/2022   10:01 AM 04/03/2022    9:49 AM 10/03/2021    8:52 AM 04/13/2021   10:01 AM  Fall Risk   Falls in the past year? 1 1 0 0 0  Number falls in past yr: 1 1   0  Injury with Fall? 1 1   0  Risk for fall due to : History of fall(s);Impaired balance/gait;Orthopedic patient History of fall(s);Impaired balance/gait;Impaired mobility   Impaired balance/gait;Impaired vision  Follow up Education provided;Falls prevention discussed Falls evaluation completed   Falls prevention discussed    FALL RISK PREVENTION PERTAINING TO THE HOME:  Any stairs in or around the home? Yes  If so, are there any without handrails? No  Home free of loose throw rugs in walkways, pet beds, electrical cords, etc? Yes  Adequate lighting in your home to reduce risk of falls? Yes   ASSISTIVE DEVICES UTILIZED TO PREVENT FALLS:  Life alert? No  Use of a cane, walker or w/c? Yes  Grab bars in the bathroom? Yes  Shower chair or bench in shower? Yes  Elevated toilet seat or a handicapped toilet? Yes   TIMED UP AND GO:  Was the test performed? No . Telephonic visit  Cognitive Function:    08/18/2017   10:52 AM  MMSE - Mini Mental State Exam  Orientation to time 5  Orientation to Place 5  Registration 3  Attention/ Calculation 5  Recall 2  Language- name 2 objects 2  Language- repeat 1  Language- follow 3 step command 3  Language- read & follow direction 1  Write a sentence 1  Copy design 1  Total score 29        04/30/2022    2:11 PM 04/13/2021    9:38 AM 04/12/2020    9:57 AM 04/12/2019    9:40 AM  6CIT Screen  What Year? 0 points 0 points 0 points 0 points  What month? 0 points 0 points 0 points 0 points  What time? 0 points 0 points 0 points 0 points  Count back from 20 0 points 0 points 0 points 0 points  Months  in reverse 2 points 0 points 0 points 0 points  Repeat phrase 10 points 10 points 2 points 0 points  Total Score 12 points 10 points 2 points 0 points    Immunizations Immunization History  Administered Date(s) Administered   Fluad Quad(high Dose 65+) 08/02/2019   Influenza Whole 08/13/2012   Influenza, High Dose Seasonal PF 08/06/2017, 08/11/2018, 08/02/2019   Influenza,inj,Quad PF,6+ Mos 08/12/2013, 08/09/2014, 09/27/2015, 07/30/2016   Influenza-Unspecified 08/12/2013, 08/09/2014, 09/27/2015, 07/30/2016   Pneumococcal Conjugate-13 12/28/2013   Pneumococcal Polysaccharide-23 10/21/1990   Zoster, Live 07/08/2011    TDAP status: Due, Education has been provided regarding the importance of this vaccine. Advised may receive this vaccine at local pharmacy or Health Dept. Aware to provide a copy of the vaccination record if obtained from local pharmacy or Health Dept. Verbalized acceptance and understanding.  Flu Vaccine status: Declined, Education has been provided regarding the importance of this vaccine but patient still declined. Advised may receive this vaccine at local pharmacy or Health Dept. Aware to provide a copy of the vaccination record if obtained from local pharmacy or Health Dept. Verbalized acceptance and understanding.  Pneumococcal vaccine status: Due, Education has been provided regarding the importance of this vaccine. Advised may receive this vaccine at local pharmacy or Health Dept. Aware to provide a copy of the vaccination record if obtained from local pharmacy or Health Dept. Verbalized acceptance and understanding.  Covid-19 vaccine status: Declined, Education has been provided regarding the importance of this vaccine but patient still declined. Advised may receive this vaccine at local pharmacy or Health Dept.or vaccine clinic. Aware to provide a copy of the vaccination record if obtained from local pharmacy or Health Dept. Verbalized acceptance and  understanding.  Qualifies for Shingles Vaccine? Yes   Zostavax completed Yes   Shingrix Completed?: No.    Education has been provided regarding the importance of this vaccine. Patient has been advised to call insurance company to determine out of pocket expense if they have not yet received this vaccine. Advised may also receive vaccine at local pharmacy or Health Dept. Verbalized acceptance and understanding.  Screening Tests Health Maintenance  Topic Date Due   COVID-19 Vaccine (1) Never done   Zoster Vaccines- Shingrix (1 of 2) 07/04/2022 (Originally 10/22/1941)   TETANUS/TDAP  10/03/2022 (Originally 08/21/2018)   INFLUENZA VACCINE  05/21/2022   Pneumonia Vaccine 20+ Years old  Completed  HPV VACCINES  Aged Out    Health Maintenance  Health Maintenance Due  Topic Date Due   COVID-19 Vaccine (1) Never done    Colorectal cancer screening: No longer required.   Lung Cancer Screening: (Low Dose CT Chest recommended if Age 82-80 years, 30 pack-year currently smoking OR have quit w/in 15years.) does not qualify.  Additional Screening:  Hepatitis C Screening: does not qualify  Vision Screening: Recommended annual ophthalmology exams for early detection of glaucoma and other disorders of the eye. Is the patient up to date with their annual eye exam?  Yes  Who is the provider or what is the name of the office in which the patient attends annual eye exams? [facility] If pt is not established with a provider, would they like to be referred to a provider to establish care? No .   Dental Screening: Recommended annual dental exams for proper oral hygiene  Community Resource Referral / Chronic Care Management: CRR required this visit?  No   CCM required this visit?  No      Plan:     I have personally reviewed and noted the following in the patient's chart:   Medical and social history Use of alcohol, tobacco or illicit drugs  Current medications and supplements including opioid  prescriptions. Patient is not currently taking opioid prescriptions. Functional ability and status Nutritional status Physical activity Advanced directives List of other physicians Hospitalizations, surgeries, and ER visits in previous 12 months Vitals Screenings to include cognitive, depression, and falls Referrals and appointments  In addition, I have reviewed and discussed with patient certain preventive protocols, quality metrics, and best practice recommendations. A written personalized care plan for preventive services as well as general preventive health recommendations were provided to patient.     Sandrea Hammond, LPN   [id number]/37/6283   Nurse Notes: None

## 2022-04-30 NOTE — Patient Instructions (Signed)
Frederick Davis , Thank you for taking time to come for your Medicare Wellness Visit. I appreciate your ongoing commitment to your health goals. Please review the following plan we discussed and let me know if I can assist you in the future.   Screening recommendations/referrals: Colonoscopy: no longer required Recommended yearly ophthalmology/optometry visit for glaucoma screening and checkup Recommended yearly dental visit for hygiene and checkup  Vaccinations: declines all Influenza vaccine: recommend every Fall Pneumococcal vaccine: Done 1992 and 12/28/2013 - recommend Prevnar-20 Tdap vaccine: recommend every 10 years Shingles vaccine: Zostavax done 2012 - recommend Shingrix which is 2 doses 2-6 months apart and over 90% effective     Covid-19: recommend 2 doses one month apart with a booster 6 months later   Advanced directives: Please bring a copy of your health care power of attorney and living will to the office to be added to your chart at your convenience.   Conditions/risks identified: Aim for 4-6 glasses of water, plenty of protein in your diet and try to get up and walk/ stretch every hour for 5-10 minutes at a time.   Next appointment: Follow up in one year for your annual wellness visit.   Preventive Care 86 Years and Older, Male  Preventive care refers to lifestyle choices and visits with your health care provider that can promote health and wellness. What does preventive care include? A yearly physical exam. This is also called an annual well check. Dental exams once or twice a year. Routine eye exams. Ask your health care provider how often you should have your eyes checked. Personal lifestyle choices, including: Daily care of your teeth and gums. Regular physical activity. Eating a healthy diet. Avoiding tobacco and drug use. Limiting alcohol use. Practicing safe sex. Taking low doses of aspirin every day. Taking vitamin and mineral supplements as recommended by  your health care provider. What happens during an annual well check? The services and screenings done by your health care provider during your annual well check will depend on your age, overall health, lifestyle risk factors, and family history of disease. Counseling  Your health care provider may ask you questions about your: Alcohol use. Tobacco use. Drug use. Emotional well-being. Home and relationship well-being. Sexual activity. Eating habits. History of falls. Memory and ability to understand (cognition). Work and work Statistician. Screening  You may have the following tests or measurements: Height, weight, and BMI. Blood pressure. Lipid and cholesterol levels. These may be checked every 5 years, or more frequently if you are over 86 years old. Skin check. Lung cancer screening. You may have this screening every year starting at age 86 if you have a 30-pack-year history of smoking and currently smoke or have quit within the past 15 years. Fecal occult blood test (FOBT) of the stool. You may have this test every year starting at age 86. Flexible sigmoidoscopy or colonoscopy. You may have a sigmoidoscopy every 5 years or a colonoscopy every 10 years starting at age 86. Prostate cancer screening. Recommendations will vary depending on your family history and other risks. Hepatitis C blood test. Hepatitis B blood test. Sexually transmitted disease (STD) testing. Diabetes screening. This is done by checking your blood sugar (glucose) after you have not eaten for a while (fasting). You may have this done every 1-3 years. Abdominal aortic aneurysm (AAA) screening. You may need this if you are a current or former smoker. Osteoporosis. You may be screened starting at age 86 if you are at high risk.  Talk with your health care provider about your test results, treatment options, and if necessary, the need for more tests. Vaccines  Your health care provider may recommend certain vaccines,  such as: Influenza vaccine. This is recommended every year. Tetanus, diphtheria, and acellular pertussis (Tdap, Td) vaccine. You may need a Td booster every 10 years. Zoster vaccine. You may need this after age 86. Pneumococcal 13-valent conjugate (PCV13) vaccine. One dose is recommended after age 86. Pneumococcal polysaccharide (PPSV23) vaccine. One dose is recommended after age 86. Talk to your health care provider about which screenings and vaccines you need and how often you need them. This information is not intended to replace advice given to you by your health care provider. Make sure you discuss any questions you have with your health care provider. Document Released: 11/03/2015 Document Revised: 06/26/2016 Document Reviewed: 08/08/2015 Elsevier Interactive Patient Education  2017 [place] Prevention in the Home Falls can cause injuries. They can happen to people of all ages. There are many things you can do to make your home safe and to help prevent falls. What can I do on the outside of my home? Regularly fix the edges of walkways and driveways and fix any cracks. Remove anything that might make you trip as you walk through a door, such as a raised step or threshold. Trim any bushes or trees on the path to your home. Use bright outdoor lighting. Clear any walking paths of anything that might make someone trip, such as rocks or tools. Regularly check to see if handrails are loose or broken. Make sure that both sides of any steps have handrails. Any raised decks and porches should have guardrails on the edges. Have any leaves, snow, or ice cleared regularly. Use sand or salt on walking paths during winter. Clean up any spills in your garage right away. This includes oil or grease spills. What can I do in the bathroom? Use night lights. Install grab bars by the toilet and in the tub and shower. Do not use towel bars as grab bars. Use non-skid mats or decals in the tub or  shower. If you need to sit down in the shower, use a plastic, non-slip stool. Keep the floor dry. Clean up any water that spills on the floor as soon as it happens. Remove soap buildup in the tub or shower regularly. Attach bath mats securely with double-sided non-slip rug tape. Do not have throw rugs and other things on the floor that can make you trip. What can I do in the bedroom? Use night lights. Make sure that you have a light by your bed that is easy to reach. Do not use any sheets or blankets that are too big for your bed. They should not hang down onto the floor. Have a firm chair that has side arms. You can use this for support while you get dressed. Do not have throw rugs and other things on the floor that can make you trip. What can I do in the kitchen? Clean up any spills right away. Avoid walking on wet floors. Keep items that you use a lot in easy-to-reach places. If you need to reach something above you, use a strong step stool that has a grab bar. Keep electrical cords out of the way. Do not use floor polish or wax that makes floors slippery. If you must use wax, use non-skid floor wax. Do not have throw rugs and other things on the floor that can make  you trip. What can I do with my stairs? Do not leave any items on the stairs. Make sure that there are handrails on both sides of the stairs and use them. Fix handrails that are broken or loose. Make sure that handrails are as long as the stairways. Check any carpeting to make sure that it is firmly attached to the stairs. Fix any carpet that is loose or worn. Avoid having throw rugs at the top or bottom of the stairs. If you do have throw rugs, attach them to the floor with carpet tape. Make sure that you have a light switch at the top of the stairs and the bottom of the stairs. If you do not have them, ask someone to add them for you. What else can I do to help prevent falls? Wear shoes that: Do not have high heels. Have  rubber bottoms. Are comfortable and fit you well. Are closed at the toe. Do not wear sandals. If you use a stepladder: Make sure that it is fully opened. Do not climb a closed stepladder. Make sure that both sides of the stepladder are locked into place. Ask someone to hold it for you, if possible. Clearly mark and make sure that you can see: Any grab bars or handrails. First and last steps. Where the edge of each step is. Use tools that help you move around (mobility aids) if they are needed. These include: Canes. Walkers. Scooters. Crutches. Turn on the lights when you go into a dark area. Replace any light bulbs as soon as they burn out. Set up your furniture so you have a clear path. Avoid moving your furniture around. If any of your floors are uneven, fix them. If there are any pets around you, be aware of where they are. Review your medicines with your doctor. Some medicines can make you feel dizzy. This can increase your chance of falling. Ask your doctor what other things that you can do to help prevent falls. This information is not intended to replace advice given to you by your health care provider. Make sure you discuss any questions you have with your health care provider. Document Released: 08/03/2009 Document Revised: 03/14/2016 Document Reviewed: 11/11/2014 Elsevier Interactive Patient Education  2017 [employer].

## 2022-05-01 ENCOUNTER — Encounter: Payer: Self-pay | Admitting: Cardiology

## 2022-05-01 ENCOUNTER — Ambulatory Visit (INDEPENDENT_AMBULATORY_CARE_PROVIDER_SITE_OTHER): Payer: Medicare Other | Admitting: Cardiology

## 2022-05-01 VITALS — BP 98/62 | HR 61 | Ht 63.0 in | Wt 153.0 lb

## 2022-05-01 DIAGNOSIS — N1832 Chronic kidney disease, stage 3b: Secondary | ICD-10-CM | POA: Diagnosis not present

## 2022-05-01 DIAGNOSIS — I2510 Atherosclerotic heart disease of native coronary artery without angina pectoris: Secondary | ICD-10-CM

## 2022-05-01 DIAGNOSIS — M7989 Other specified soft tissue disorders: Secondary | ICD-10-CM | POA: Diagnosis not present

## 2022-05-01 DIAGNOSIS — I10 Essential (primary) hypertension: Secondary | ICD-10-CM

## 2022-05-01 DIAGNOSIS — I4821 Permanent atrial fibrillation: Secondary | ICD-10-CM

## 2022-05-01 DIAGNOSIS — E785 Hyperlipidemia, unspecified: Secondary | ICD-10-CM | POA: Diagnosis not present

## 2022-05-01 DIAGNOSIS — I350 Nonrheumatic aortic (valve) stenosis: Secondary | ICD-10-CM

## 2022-05-01 NOTE — Patient Instructions (Addendum)
Medication Instructions:  Please discontinue your Carvedilol. Continue all other medications as listed.  *If you need a refill on your cardiac medications before your next appointment, please call your pharmacy*  Follow-Up: At [facility], you and your health needs are our priority.  As part of our continuing mission to provide you with exceptional heart care, we have created designated Provider Care Teams.  These Care Teams include your primary Cardiologist (physician) and Advanced Practice Providers (APPs -  Physician Assistants and Nurse Practitioners) who all work together to provide you with the care you need, when you need it.  We recommend signing up for the patient portal called "MyChart".  Sign up information is provided on this After Visit Summary.  MyChart is used to connect with patients for Virtual Visits (Telemedicine).  Patients are able to view lab/test results, encounter notes, upcoming appointments, etc.  Non-urgent messages can be sent to your provider as well.   To learn more about what you can do with MyChart, go to [email or web address].    Your next appointment:   4 month(s)  The format for your next appointment:   In Person  Provider:   Minus Breeding, MD{   Important Information About Sugar

## 2022-05-13 DIAGNOSIS — Z85828 Personal history of other malignant neoplasm of skin: Secondary | ICD-10-CM | POA: Diagnosis not present

## 2022-05-13 DIAGNOSIS — D044 Carcinoma in situ of skin of scalp and neck: Secondary | ICD-10-CM | POA: Diagnosis not present

## 2022-05-13 DIAGNOSIS — D485 Neoplasm of uncertain behavior of skin: Secondary | ICD-10-CM | POA: Diagnosis not present

## 2022-05-13 DIAGNOSIS — C44622 Squamous cell carcinoma of skin of right upper limb, including shoulder: Secondary | ICD-10-CM | POA: Diagnosis not present

## 2022-05-23 ENCOUNTER — Ambulatory Visit: Payer: Self-pay | Admitting: *Deleted

## 2022-05-23 ENCOUNTER — Ambulatory Visit: Payer: Medicare Other | Admitting: Family Medicine

## 2022-05-23 NOTE — Patient Instructions (Signed)
Frederick Davis  At some point during the past 4 years, I have worked with you through the [facility] Management Program at [facility] [place].  Due to program changes I am removing myself from your care team.   If you are currently active with another CCM Team Member, you will remain active with them unless they reach out to you with additional information.   If you feel that you need services in the future,  please talk with your primary care provider and request a new referral for Care Management or Care Coordination services. This does not affect your status as a patient at [facility].   Thank you for allowing me to participate in your your healthcare journey.  Chong Sicilian, BSN, RN-BC Proofreader Dial: [phone]

## 2022-05-23 NOTE — Chronic Care Management (AMB) (Signed)
  Chronic Care Management   Note  05/23/2022 Name: Frederick Davis MRN: [id number] DOB: 1923-01-19   Due to changes in the Chronic Care Management program, I am removing myself as the [place] from the Care Team and closing [place]. Patient was not scheduled to be followed by the RN Care Coordination nurse for [facility].   Patient has an open Care Plan with another CCM team member, Theadore Nan, LCSW.  I will forward this case closure encounter to the other team member(s). Patient does not have a current CCM referral placed since 02/18/22. CCM enrollment status changed to "not enrolled".   Patient's PCP can place a new referral if the they needs Care Management or Care Coordination services in the future.  Chong Sicilian, BSN, RN-BC Proofreader Dial: [phone]

## 2022-06-05 ENCOUNTER — Other Ambulatory Visit: Payer: Medicare Other

## 2022-06-05 DIAGNOSIS — E039 Hypothyroidism, unspecified: Secondary | ICD-10-CM | POA: Diagnosis not present

## 2022-06-06 ENCOUNTER — Ambulatory Visit (INDEPENDENT_AMBULATORY_CARE_PROVIDER_SITE_OTHER): Payer: Medicare Other | Admitting: Family Medicine

## 2022-06-06 ENCOUNTER — Encounter: Payer: Self-pay | Admitting: Family Medicine

## 2022-06-06 VITALS — BP 107/68 | HR 71 | Temp 97.5°F | Ht 63.0 in | Wt 154.6 lb

## 2022-06-06 DIAGNOSIS — F02A0 Dementia in other diseases classified elsewhere, mild, without behavioral disturbance, psychotic disturbance, mood disturbance, and anxiety: Secondary | ICD-10-CM

## 2022-06-06 DIAGNOSIS — G301 Alzheimer's disease with late onset: Secondary | ICD-10-CM

## 2022-06-06 DIAGNOSIS — I2510 Atherosclerotic heart disease of native coronary artery without angina pectoris: Secondary | ICD-10-CM

## 2022-06-06 LAB — TSH+FREE T4
Free T4: 1.27 ng/dL (ref 0.82–1.77)
TSH: 2.8 u[IU]/mL (ref 0.450–4.500)

## 2022-06-06 MED ORDER — RIVASTIGMINE TARTRATE 1.5 MG PO CAPS: 1.5000 mg | ORAL_CAPSULE | Freq: Two times a day (BID) | ORAL | 2 refills | Status: DC

## 2022-06-06 NOTE — Progress Notes (Signed)
Subjective:  Patient ID: Frederick Davis, male    DOB: [birth year]  Age: 88 y.o. MRN: [id number]  CC: Memory Loss   HPI Frederick Davis [facility] presents for evaluation for Alzheimers. Wife concerned about memory & cognition. He couldn't tolerate donepezil as it made him agitated with the first dose. Took a second and sx were the same so wife DCed it. HE was very hostile toward her and he is usually very mild mannered.   Also here for recheck of thyroid. Dose changed to 88 mcg 2 months ago. Has poor energy still. Feels like he is in a routine. Every day is the same. Wife says he just eats and sleeps.     06/06/2022    1:53 PM 04/30/2022    2:07 PM 04/03/2022   10:01 AM  Depression screen PHQ 2/9  Decreased Interest 0 0 0  Down, Depressed, Hopeless 0 0 0  PHQ - 2 Score 0 0 0    History Frederick Davis has a past medical history of Anemia, Aortic stenosis, Arthritis, CAD (coronary artery disease), Chronic renal insufficiency, stage III (moderate) (HCC), Dyspnea, Esophageal stricture (YRS AGO), Hypertension, Hypothyroidism, Kidney stone (1969), MI (myocardial infarction) ([place]) (2017), Mitral regurgitation, Myocardial infarction ([place]) (03/12/2013), Osteoporosis, PC (prostate cancer) ([place]) (1987), RBBB, Rectum cancer ([place]) (1986), and Renal cell cancer ([place]) (2005).   He has a past surgical history that includes Kidney surgery (Right, 2004); Radiofrequency ablation kidney (2006); renal sphincter appliance (2007); Colonoscopy (01/08/2012); Colostomy (Left, 1986); Percutaneous coronary intervention-balloon only; left heart catheterization with coronary angiogram (N/A, 03/12/2013); Prostatectomy; Colonoscopy (N/A, 07/20/2015); Cardiac catheterization (N/A, 12/06/2015); Coronary artery bypass graft (N/A, 12/08/2015); TEE without cardioversion (N/A, 12/08/2015); Hernia repair; Removal of penile prosthesis (N/A, 12/15/2017); and Removal of penile prosthesis (N/A, 02/23/2018).   His family history includes AAA (abdominal aortic  aneurysm) (age of onset: 45) in his father; Arthritis in his father; Cancer in his son; Hypertension in his son; Parkinson's disease in his sister; Stroke (age of onset: 82) in his mother.He reports that he quit smoking about 53 years ago. His smoking use included cigarettes. He started smoking about 75 years ago. He has a 5.00 pack-year smoking history. He has never been exposed to tobacco smoke. He has never used smokeless tobacco. He reports current alcohol use of about 1.0 standard drink of alcohol per week. He reports that he does not use drugs.    ROS Review of Systems  Constitutional:  Negative for fever.  Respiratory:  Negative for shortness of breath.   Cardiovascular:  Negative for chest pain.  Musculoskeletal:  Negative for arthralgias.  Skin:  Negative for rash.    Objective:  BP 107/68   Pulse 71   Temp (!) 97.5 F (36.4 C)   Ht '5\' 3"'$  (1.6 m)   Wt 154 lb 9.6 oz (70.1 kg)   SpO2 96%   BMI 27.39 kg/m   BP Readings from Last 3 Encounters:  06/06/22 107/68  05/01/22 98/62  04/03/22 108/68    Wt Readings from Last 3 Encounters:  06/06/22 154 lb 9.6 oz (70.1 kg)  05/01/22 153 lb (69.4 kg)  04/30/22 150 lb (68 kg)     Physical Exam Vitals reviewed.  Constitutional:      Appearance: He is well-developed.  HENT:     Head: Normocephalic and atraumatic.     Right Ear: External ear normal.     Left Ear: External ear normal.     Mouth/Throat:     Pharynx: No oropharyngeal exudate  or posterior oropharyngeal erythema.  Eyes:     Pupils: Pupils are equal, round, and reactive to light.  Cardiovascular:     Rate and Rhythm: Normal rate and regular rhythm.     Heart sounds: No murmur heard. Pulmonary:     Effort: No respiratory distress.     Breath sounds: Normal breath sounds.  Musculoskeletal:     Cervical back: Normal range of motion and neck supple.  Neurological:     General: No focal deficit present.     Mental Status: He is alert and oriented to person,  place, and time.     Motor: Weakness (generalized) present.        06/06/2022    2:03 PM 08/18/2017   10:52 AM  MMSE - Mini Mental State Exam  Orientation to time 3 5  Orientation to Place 3 5  Registration 3 3  Attention/ Calculation 5 5  Recall 0 2  Language- name 2 objects 2 2  Language- repeat 1 1  Language- follow 3 step command 2 3  Language- read & follow direction 1 1  Write a sentence 1 1  Copy design 1 1  Total score 22 29     Assessment & Plan:   Frederick Davis was seen today for memory loss.  Diagnoses and all orders for this visit:  Mild late onset Alzheimer's dementia without behavioral disturbance, psychotic disturbance, mood disturbance, or anxiety ([place])  Other orders -     rivastigmine (EXELON) 1.5 MG capsule; Take 1 capsule (1.5 mg total) by mouth 2 (two) times daily.       I have discontinued [name]. Frederick Davis's meclizine and donepezil. I am also having him start on rivastigmine. Additionally, I am having him maintain his multivitamin, lipase/protease/amylase, Multiple Vitamins-Minerals (ZINC PO), nitroGLYCERIN, aspirin EC, torsemide, and levothyroxine.  Allergies as of 06/06/2022       Reactions   Lipitor [atorvastatin] Other (See Comments)   Muscle aches        Medication List        Accurate as of June 06, 2022  2:48 PM. If you have any questions, ask your nurse or doctor.          STOP taking these medications    donepezil 5 MG tablet Commonly known as: Aricept Stopped by: Claretta Fraise, MD   meclizine 25 MG tablet Commonly known as: ANTIVERT Stopped by: Claretta Fraise, MD       TAKE these medications    aspirin EC 81 MG tablet Take 1 tablet (81 mg total) by mouth daily. Swallow whole.   levothyroxine 88 MCG tablet Commonly known as: SYNTHROID Take 1 tablet (88 mcg total) by mouth daily.   lipase/protease/amylase 36000 UNITS Cpep capsule Commonly known as: CREON Take 1 capsule by mouth at the first bite of each meal,  and 1 capsule during meal, and 1 capsule at the last bite of a meal.  Allow for 3 meals per day.  If eating snacks take 1 capsule with snack.   multivitamin capsule Take 1 capsule daily by mouth.   nitroGLYCERIN 0.4 MG SL tablet Commonly known as: NITROSTAT PLACE 1 TABLET UNDER THE TONGUE AT ONSET OF CHEST PAIN EVERY 5 MINTUES UP TO 3 TIMES AS NEEDED   rivastigmine 1.5 MG capsule Commonly known as: EXELON Take 1 capsule (1.5 mg total) by mouth 2 (two) times daily. Started by: Claretta Fraise, MD   torsemide 20 MG tablet Commonly known as: DEMADEX Take 1 tablet (20 mg total)  by mouth daily.   ZINC PO Take by mouth at bedtime.         Follow-up: Return in about 3 months (around 09/06/2022).  Claretta Fraise, [name]

## 2022-08-25 NOTE — Progress Notes (Unsigned)
Cardiology Office Note   Date:  08/28/2022   ID:  Frederick Davis, DOB 01/13/1923, MRN [id number]  PCP:  Claretta Fraise, MD  Cardiologist:   Minus Breeding, MD   Chief Complaint  Patient presents with   Atrial Fibrillation      History of Present Illness: Frederick Davis is a 88 y.o. male who for followup after a acute inferior wall infarct in May of 2014. He had thrombectomy of a right coronary artery occlusion. He was initially acute respiratory failure and was cooled. However, he responded very well to therapy.    He underwent a CABG 3 on February 2017.     He was admitted with syncopal episode related to aortic valve stenosis and new onset atrial fibrillation.  He has remained rate controlled with his carvedilol, but given his high CHA2DS2-VASc score of 4 he was recommended to start on Eliquis.  He has had 2D echocardiogram with LVEF 55% and no wall motion abnormalities noted.   I reviewed these records for this visit.    He called last week and had increased swelling and weight gain.   At the last visit I changed Lasix to Demadex.  I did stop his Eliquis because of almost continuous nosebleeds.  Since I last saw him he has done OK. .  The patient denies any new symptoms such as chest discomfort, neck or arm discomfort. There has been no new shortness of breath, PND or orthopnea. There have been no reported palpitations, presyncope or syncope.  He has had a couple of falls.  He gets around slowly with the walker.  The patient denies any new symptoms such as chest discomfort, neck or arm discomfort. There has been no new shortness of breath, PND or orthopnea. There have been no reported palpitations, presyncope or syncope.    Past Medical History:  Diagnosis Date   Anemia    Aortic stenosis    Moderate // Echo 05/2019: EF 60-65, asymmetric septal LVH, mildly reduced RVSF, RVSP 37.4, mod RAE, severe LAE, mod TR, mod AS (V-max 280 cm/s, mean gradient 23 mmHg, DI 0.35), aortic  root 38   Arthritis    left knee    CAD (coronary artery disease)    a. RCA thrombectomy 2014. b. CABGx3 in 11/2015 (EF 45-50% in 11/2015).   Chronic renal insufficiency, stage III (moderate) (HCC)    only has 0ne kidney   Dyspnea    WITH EXERTION   Esophageal stricture YRS AGO   Hypertension    Hypothyroidism    Kidney stone 1969   MI (myocardial infarction) ([place]) 2017   Mitral regurgitation    a. mild by TEE 11/2015.   Myocardial infarction ([place]) 03/12/2013   Inferior MI.  LAD mid 80% stenosis, circ mild plaque, RCA 100% with thrombectomy PTCA.  EF 65%   Osteoporosis    PC (prostate cancer) ([place]) 1987   RBBB    Rectum cancer ([place]) 1986   Renal cell cancer ([place]) 2005    Past Surgical History:  Procedure Laterality Date   CARDIAC CATHETERIZATION N/A 12/06/2015   Procedure: Left Heart Cath and Coronary Angiography;  Surgeon: Burnell Blanks, MD;  Location: [facility];  Service: Cardiovascular;  Laterality: N/A;   COLONOSCOPY  01/08/2012   Procedure: COLONOSCOPY;  Surgeon: Rogene Houston, MD;  [facility];  Service: Endoscopy;  Laterality: N/A;  1030   COLONOSCOPY N/A 07/20/2015   Procedure: COLONOSCOPY;  Surgeon: Mechele Dawley  Laural Golden, MD;  [facility];  Service: Endoscopy;  Laterality: N/A;  930 - moved to 9/29 @ 2:00   COLOSTOMY Left 1986   colon cancer   CORONARY ARTERY BYPASS GRAFT N/A 12/08/2015   Procedure: CORONARY ARTERY BYPASS GRAFTING (CABG);  Surgeon: Melrose Nakayama, MD;  Location: [facility];  Service: Open Heart Surgery;  Laterality: N/A;  Times 3 using left internal mammary artery and endoscopycally harvested bilateral saphenous vein.   HERNIA REPAIR     2007 RIGHT INGUINAL HERNIA   KIDNEY SURGERY Right 2004   LEFT HEART CATHETERIZATION WITH CORONARY ANGIOGRAM N/A 03/12/2013   Procedure: STEMI;  Surgeon: Burnell Blanks, MD;  Location: [facility]ATH LAB;  Service: Cardiovascular;  Laterality: N/A;   PERCUTANEOUS CORONARY  INTERVENTION-BALLOON ONLY     PROSTATECTOMY     RADIOFREQUENCY ABLATION KIDNEY  2006   REMOVAL OF PENILE PROSTHESIS N/A 12/15/2017   Procedure: REMOVAL OF PENILE PROSTHESIS;  Surgeon: Franchot Gallo, MD;  [facility];  Service: Urology;  Laterality: N/A;   REMOVAL OF PENILE PROSTHESIS N/A 02/23/2018   Procedure: [name] OF ERODED SPHINCTER;  Surgeon: Franchot Gallo, MD;  [facility];  Service: Urology;  Laterality: N/A;   renal sphincter appliance  2007   TEE WITHOUT CARDIOVERSION N/A 12/08/2015   Procedure: TRANSESOPHAGEAL ECHOCARDIOGRAM (TEE);  Surgeon: Melrose Nakayama, MD;  Location: [facility];  Service: Open Heart Surgery;  Laterality: N/A;   Prior to Admission medications   Medication Sig Start Date End Date Taking? Authorizing Provider  aspirin EC 81 MG tablet Take 1 tablet (81 mg total) by mouth daily. Swallow whole. 12/12/21  Yes Minus Breeding, MD  carvedilol (COREG) 3.125 MG tablet TAKE 1 TABLET TWICE A DAY WITH MEALS (BREAKFAST AND SUPPER) 10/03/21  Yes Claretta Fraise, MD  levothyroxine (SYNTHROID) 75 MCG tablet Take 1 tablet (75 mcg total) by mouth daily. 10/03/21  Yes Claretta Fraise, MD  lipase/protease/amylase (CREON) 36000 UNITS CPEP capsule Take 1 capsule by mouth at the first bite of each meal, and 1 capsule during meal, and 1 capsule at the last bite of a meal.  Allow for 3 meals per day.  If eating snacks take 1 capsule with snack. 11/02/19  Yes [provider]  meclizine (ANTIVERT) 25 MG tablet Take 25 mg by mouth 3 (three) times daily as needed for dizziness.   Yes [provider]  Multiple Vitamin (MULTIVITAMIN) capsule Take 1 capsule daily by mouth.   Yes [provider]  Multiple Vitamins-Minerals (ZINC PO) Take by mouth at bedtime.   Yes [provider]  nitroGLYCERIN (NITROSTAT) 0.4 MG SL tablet PLACE 1 TABLET UNDER THE TONGUE AT ONSET OF CHEST PAIN EVERY 5 MINTUES UP TO 3 TIMES AS NEEDED 10/29/21  Yes Almyra Deforest, PA   torsemide (DEMADEX) 20 MG tablet Take 1 tablet (20 mg total) by mouth daily. 12/21/21  Yes Minus Breeding, MD  benzonatate (TESSALON PERLES) 100 MG capsule Take 1 capsule (100 mg total) by mouth 3 (three) times daily as needed. Patient not taking: Reported on 12/12/2021 09/26/21   Janora Norlander, DO  guaiFENesin-codeine (CHERATUSSIN AC) 100-10 MG/5ML syrup Take 5 mLs by mouth every 4 (four) hours as needed for cough. Patient not taking: Reported on 12/12/2021 10/03/21   Claretta Fraise, MD    Allergies:   Lipitor [atorvastatin]    ROS:  Please see the history of present illness.   Otherwise, review of systems are positive for none.   All other systems  are reviewed and negative.    PHYSICAL EXAM: VS:  BP 108/60   Pulse 66   Ht '5\' 3"'$  (1.6 m)   Wt 144 lb 9.6 oz (65.6 kg)   SpO2 98%   BMI 25.61 kg/m  , BMI Body mass index is 25.61 kg/m. GENERAL:  Well appearing NECK:  No jugular venous distention, waveform within normal limits, carotid upstroke brisk and symmetric, no bruits, no thyromegaly LUNGS:  Clear to auscultation bilaterally CHEST:  Unremarkable HEART:  PMI not displaced or sustained,S1 and S2 within normal limits, no S3, no S4, no clicks, no rubs, please murmurs ABD:  Flat, positive bowel sounds normal in frequency in pitch, no bruits, no rebound, no guarding, no midline pulsatile mass, no hepatomegaly, no splenomegaly EXT:  2 plus pulses throughout, moderate left greater than right edema, no cyanosis no clubbing, venous ulcer healing   EKG:  EKG is  not ordered today.    Recent Labs: 10/07/2021: Magnesium 2.0 04/03/2022: ALT 16; BUN 39; Creatinine, Ser 1.58; Hemoglobin 10.3; Platelets 232; Potassium 4.4; Sodium 136 06/05/2022: TSH 2.800    Lipid Panel    Component Value Date/Time   CHOL 94 (L) 10/03/2021 0952   CHOL 106 05/27/2013 0818   TRIG 66 10/03/2021 0952   TRIG 81 10/29/2016 0815   TRIG 76 05/27/2013 0818   HDL 45 10/03/2021 0952   HDL 54 10/29/2016 0815    HDL 62 05/27/2013 0818   CHOLHDL 2.1 10/03/2021 0952   CHOLHDL 2.8 10/18/2015 0530   VLDL 19 10/18/2015 0530   LDLCALC 34 10/03/2021 0952   LDLCALC 29 08/09/2014 0854   LDLCALC 29 05/27/2013 0818      Wt Readings from Last 3 Encounters:  08/28/22 144 lb 9.6 oz (65.6 kg)  06/06/22 154 lb 9.6 oz (70.1 kg)  05/01/22 153 lb (69.4 kg)      Other studies Reviewed: Additional studies/ records that were reviewed today include: None Review of the above records demonstrates: See elsewhere   ASSESSMENT AND PLAN:  ATRIAL FIB:    He had reasonable rate control.  He does not feel his fibrillation.  Is not on anticoagulation or Watchman candidate.  No change in therapy.   CAD/CABG:    The patient has no new sypmtoms.  No further cardiovascular testing is indicated.  We will continue with aggressive risk reduction and meds as listed.  HTN:   His blood pressure is at target.  No change in therapy.   DYSLIPIDEMIA:  LDL was 34.  No change in therapy.   AORTIC STENOSIS:    This is severe but with his advanced age were managing this medically.  No further imaging   CKD IIIB: Creatinine is 1.58.  He will continue the meds as listed.  He is going to have this checked again next week  LEG SWELLING:   This actually seems to be improved.  He does have a small ulcer in the leg and we talked about local care of this.   Current medicines are reviewed at length with the patient today.  The patient does not have concerns regarding medicines.  The following changes have been made:  None  Labs/ tests ordered today include:  None  No orders of the defined types were placed in this encounter.    Disposition:   FU with me in 12 months     Signed, Minus Breeding, MD  08/28/2022 2:00 PM    [place]

## 2022-08-28 ENCOUNTER — Ambulatory Visit (INDEPENDENT_AMBULATORY_CARE_PROVIDER_SITE_OTHER): Payer: Medicare Other | Admitting: Cardiology

## 2022-08-28 ENCOUNTER — Encounter: Payer: Self-pay | Admitting: Cardiology

## 2022-08-28 VITALS — BP 108/60 | HR 66 | Ht 63.0 in | Wt 144.6 lb

## 2022-08-28 DIAGNOSIS — I10 Essential (primary) hypertension: Secondary | ICD-10-CM

## 2022-08-28 DIAGNOSIS — N1832 Chronic kidney disease, stage 3b: Secondary | ICD-10-CM | POA: Diagnosis not present

## 2022-08-28 DIAGNOSIS — I482 Chronic atrial fibrillation, unspecified: Secondary | ICD-10-CM | POA: Diagnosis not present

## 2022-08-28 DIAGNOSIS — E785 Hyperlipidemia, unspecified: Secondary | ICD-10-CM | POA: Diagnosis not present

## 2022-08-28 DIAGNOSIS — I350 Nonrheumatic aortic (valve) stenosis: Secondary | ICD-10-CM

## 2022-08-28 DIAGNOSIS — I2510 Atherosclerotic heart disease of native coronary artery without angina pectoris: Secondary | ICD-10-CM

## 2022-08-28 NOTE — Patient Instructions (Signed)
Medication Instructions:  The current medical regimen is effective;  continue present plan and medications.  *If you need a refill on your cardiac medications before your next appointment, please call your pharmacy*  Follow-Up: At [facility], you and your health needs are our priority.  As part of our continuing mission to provide you with exceptional heart care, we have created designated Provider Care Teams.  These Care Teams include your primary Cardiologist (physician) and Advanced Practice Providers (APPs -  Physician Assistants and Nurse Practitioners) who all work together to provide you with the care you need, when you need it.  We recommend signing up for the patient portal called "MyChart".  Sign up information is provided on this After Visit Summary.  MyChart is used to connect with patients for Virtual Visits (Telemedicine).  Patients are able to view lab/test results, encounter notes, upcoming appointments, etc.  Non-urgent messages can be sent to your provider as well.   To learn more about what you can do with MyChart, go to [email or web address].    Your next appointment:   6 month(s)  The format for your next appointment:   In Person  Provider:   James Hochrein, MD     Important Information About Sugar       

## 2022-09-03 ENCOUNTER — Encounter: Payer: Self-pay | Admitting: Family Medicine

## 2022-09-03 ENCOUNTER — Ambulatory Visit (INDEPENDENT_AMBULATORY_CARE_PROVIDER_SITE_OTHER): Payer: Medicare Other | Admitting: Family Medicine

## 2022-09-03 VITALS — BP 125/84 | HR 70 | Temp 98.8°F | Ht 63.0 in | Wt 144.0 lb

## 2022-09-03 DIAGNOSIS — H538 Other visual disturbances: Secondary | ICD-10-CM

## 2022-09-03 NOTE — Progress Notes (Signed)
Subjective:  Patient ID: Frederick Davis, male    DOB: 1923/09/09, 88 y.o.   MRN: [id number]  Patient Care Team: Claretta Fraise, MD as PCP - General (Family Medicine) Minus Breeding, MD as PCP - Cardiology (Cardiology) Burman Foster, MD as Physician Assistant (Urology) Danella Sensing, MD as Consulting Physician (Dermatology) Gaynelle Arabian, MD as Consulting Physician (Orthopedic Surgery) Rogene Houston, MD as Consulting Physician (Gastroenterology) Shea Evans Norva Riffle, LCSW as [place] Management (Licensed Clinical Social Worker)   Chief Complaint:  blurry vision, sudden onset   HPI: Frederick Davis is a 88 y.o. male presenting on 09/03/2022 for blurry vision, sudden onset   Pt presents today with concerns of blurred vision. States his vision is always blurred in left eye. States he woke up this morning and got out of bed. States when he did, his right eye vision was blurry. No other associated symptoms. Does report floaters and double vision at times.      Relevant past medical, surgical, family, and social history reviewed and updated as indicated.  Allergies and medications reviewed and updated. Data reviewed: Chart in Epic.   Past Medical History:  Diagnosis Date   Anemia    Aortic stenosis    Moderate // Echo 05/2019: EF 60-65, asymmetric septal LVH, mildly reduced RVSF, RVSP 37.4, mod RAE, severe LAE, mod TR, mod AS (V-max 280 cm/s, mean gradient 23 mmHg, DI 0.35), aortic root 38   Arthritis    left knee    CAD (coronary artery disease)    a. RCA thrombectomy 2014. b. CABGx3 in 11/2015 (EF 45-50% in 11/2015).   Chronic renal insufficiency, stage III (moderate) (HCC)    only has 0ne kidney   Dyspnea    WITH EXERTION   Esophageal stricture YRS AGO   Hypertension    Hypothyroidism    Kidney stone 1969   MI (myocardial infarction) ([place]) 2017   Mitral regurgitation    a. mild by TEE 11/2015.   Myocardial infarction ([place]) 03/12/2013    Inferior MI.  LAD mid 80% stenosis, circ mild plaque, RCA 100% with thrombectomy PTCA.  EF 65%   Osteoporosis    PC (prostate cancer) ([place]) 1987   RBBB    Rectum cancer ([place]) 1986   Renal cell cancer ([place]) 2005    Past Surgical History:  Procedure Laterality Date   CARDIAC CATHETERIZATION N/A 12/06/2015   Procedure: Left Heart Cath and Coronary Angiography;  Surgeon: Burnell Blanks, MD;  Location: [facility];  Service: Cardiovascular;  Laterality: N/A;   COLONOSCOPY  01/08/2012   Procedure: COLONOSCOPY;  Surgeon: Rogene Houston, MD;  [facility];  Service: Endoscopy;  Laterality: N/A;  1030   COLONOSCOPY N/A 07/20/2015   Procedure: COLONOSCOPY;  Surgeon: Rogene Houston, MD;  [facility];  Service: Endoscopy;  Laterality: N/A;  930 - moved to 9/29 @ 2:00   COLOSTOMY Left 1986   colon cancer   CORONARY ARTERY BYPASS GRAFT N/A 12/08/2015   Procedure: CORONARY ARTERY BYPASS GRAFTING (CABG);  Surgeon: Melrose Nakayama, MD;  Location: [facility];  Service: Open Heart Surgery;  Laterality: N/A;  Times 3 using left internal mammary artery and endoscopycally harvested bilateral saphenous vein.   HERNIA REPAIR     2007 RIGHT INGUINAL HERNIA   KIDNEY SURGERY Right 2004   LEFT HEART CATHETERIZATION WITH CORONARY ANGIOGRAM N/A 03/12/2013   Procedure: STEMI;  Surgeon: Burnell Blanks, MD;  Location[facility]  CATH LAB;  Service: Cardiovascular;  Laterality: N/A;   PERCUTANEOUS CORONARY INTERVENTION-BALLOON ONLY     PROSTATECTOMY     RADIOFREQUENCY ABLATION KIDNEY  2006   REMOVAL OF PENILE PROSTHESIS N/A 12/15/2017   Procedure: REMOVAL OF PENILE PROSTHESIS;  Surgeon: Franchot Gallo, MD;  [facility];  Service: Urology;  Laterality: N/A;   REMOVAL OF PENILE PROSTHESIS N/A 02/23/2018   Procedure: [name] OF ERODED SPHINCTER;  Surgeon: Franchot Gallo, MD;  [facility];  Service: Urology;  Laterality: N/A;   renal sphincter appliance  2007   TEE  WITHOUT CARDIOVERSION N/A 12/08/2015   Procedure: TRANSESOPHAGEAL ECHOCARDIOGRAM (TEE);  Surgeon: Melrose Nakayama, MD;  Location: [facility];  Service: Open Heart Surgery;  Laterality: N/A;    Social History   Socioeconomic History   Marital status: Married    Spouse name: Mikle Bosworth   Number of children: 4   Years of education: 18   Highest education level: Professional school degree (e.g., MD, DDS, DVM, JD)  Occupational History   Occupation: pharmacist-retired   Occupation: preacher-retired  Tobacco Use   Smoking status: Former    Packs/day: 0.50    Years: 10.00    Total pack years: 5.00    Types: Cigarettes    Start date: 10/21/1946    Quit date: 10/21/1968    Years since quitting: 53.9    Passive exposure: Never   Smokeless tobacco: Never  Vaping Use   Vaping Use: Never used  Substance and Sexual Activity   Alcohol use: Yes    Alcohol/week: 1.0 standard drink of alcohol    Types: 1 Standard drinks or equivalent per week    Comment: RARE   Drug use: No   Sexual activity: Not Currently  Other Topics Concern   Not on file  Social History Narrative   Lives home with wife. One child in [place], others live in [place] Strain: Low Risk  (04/30/2022)   Overall Financial Resource Strain (CARDIA)    Difficulty of Paying Living Expenses: Not hard at all  Food Insecurity: No Food Insecurity (04/30/2022)   Hunger Vital Sign    Worried About Running Out of Food in the Last Year: Never true    Ran Out of Food in the Last Year: Never true  Transportation Needs: No Transportation Needs (04/30/2022)   PRAPARE - Hydrologist (Medical): No    Lack of Transportation (Non-Medical): No  Physical Activity: Insufficiently Active (04/30/2022)   Exercise Vital Sign    Days of Exercise per Week: 7 days    Minutes of Exercise per Session: 10 min  Stress: No Stress Concern Present (04/30/2022)   [place]    Feeling of Stress : Only a little  Social Connections: Socially Integrated (04/30/2022)   Social Connection and Isolation Panel [NHANES]    Frequency of Communication with Friends and Family: More than three times a week    Frequency of Social Gatherings with Friends and Family: More than three times a week    Attends Religious Services: More than 4 times per year    Active Member of [employer] or Organizations: Yes    Attends Archivist Meetings: More than 4 times per year    Marital Status: Married  Human resources officer Violence: Not At Risk (04/30/2022)   Humiliation, Afraid, Rape, and Kick questionnaire    Fear of Current or  Ex-Partner: No    Emotionally Abused: No    Physically Abused: No    Sexually Abused: No    Outpatient Encounter Medications as of 09/03/2022  Medication Sig   aspirin EC 81 MG tablet Take 1 tablet (81 mg total) by mouth daily. Swallow whole.   levothyroxine (SYNTHROID) 88 MCG tablet Take 1 tablet (88 mcg total) by mouth daily.   lipase/protease/amylase (CREON) 36000 UNITS CPEP capsule 36,000 Units 3 (three) times daily with meals.   Multiple Vitamin (MULTIVITAMIN) capsule Take 1 capsule daily by mouth.   Multiple Vitamins-Minerals (ZINC PO) Take by mouth at bedtime.   nitroGLYCERIN (NITROSTAT) 0.4 MG SL tablet PLACE 1 TABLET UNDER THE TONGUE AT ONSET OF CHEST PAIN EVERY 5 MINTUES UP TO 3 TIMES AS NEEDED   rivastigmine (EXELON) 1.5 MG capsule Take 1 capsule (1.5 mg total) by mouth 2 (two) times daily.   torsemide (DEMADEX) 20 MG tablet Take 1 tablet (20 mg total) by mouth daily.   Facility-Administered Encounter Medications as of 09/03/2022  Medication   etomidate (AMIDATE) injection   lidocaine (cardiac) 100 mg/62m (XYLOCAINE) 20 MG/ML injection 2%   succinylcholine (ANECTINE) injection    Allergies  Allergen Reactions   Lipitor [Atorvastatin] Other (See Comments)    Muscle aches     Review of Systems  Eyes:  Positive for visual disturbance.  All other systems reviewed and are negative.       Objective:  BP 125/84   Pulse 70   Temp 98.8 F (37.1 C)   Ht '5\' 3"'$  (1.6 m) Comment: previous, 6 days ago  Wt 144 lb (65.3 kg) Comment: previous, 6 days ago  SpO2 96%   BMI 25.51 kg/m    Wt Readings from Last 3 Encounters:  09/03/22 144 lb (65.3 kg)  08/28/22 144 lb 9.6 oz (65.6 kg)  06/06/22 154 lb 9.6 oz (70.1 kg)    Physical Exam Vitals and nursing note reviewed.  Constitutional:      General: He is not in acute distress.    Appearance: Normal appearance. He is not ill-appearing, toxic-appearing or diaphoretic.  HENT:     Head: Normocephalic and atraumatic.     Mouth/Throat:     Mouth: Mucous membranes are moist.  Eyes:     General: Visual field deficit present.     Comments: Vision blurry in both eyes  Cardiovascular:     Rate and Rhythm: Normal rate and regular rhythm.     Heart sounds: Murmur heard.     Systolic murmur is present with a grade of 2/6.  Pulmonary:     Breath sounds: Normal breath sounds.  Neurological:     Mental Status: He is alert.     Gait: Gait abnormal (using walker).     Results for orders placed or performed in visit on 06/05/22  TSH + free T4  Result Value Ref Range   TSH 2.800 0.450 - 4.500 uIU/mL   Free T4 1.27 0.82 - 1.77 ng/dL       Pertinent labs & imaging results that were available during my care of the patient were reviewed by me and considered in my medical decision making.  Assessment & Plan:  HJonothanwas seen today for blurry vision, sudden onset.  Diagnoses and all orders for this visit:  Vision blurred Chronic in left eye, onset in right this morning. Does have some floaters and shadows. Able to see but everything is blurry. Spoke with Dr. LMarin Comment she is able to evaluate pt  now, pt sent to her office with wife.     Continue all other maintenance medications.  Follow up plan: To Dr. Gus Puma office  for evaluation  The above assessment and management plan was discussed with the patient. The patient verbalized understanding of and has agreed to the management plan. Patient is aware to call the clinic if they develop any new symptoms or if symptoms persist or worsen. Patient is aware when to return to the clinic for a follow-up visit. Patient educated on when it is appropriate to go to the emergency department.   Monia Pouch, FNP-C [facility] [phone]

## 2022-09-05 ENCOUNTER — Ambulatory Visit (INDEPENDENT_AMBULATORY_CARE_PROVIDER_SITE_OTHER): Payer: Medicare Other | Admitting: Family Medicine

## 2022-09-05 ENCOUNTER — Encounter: Payer: Self-pay | Admitting: Family Medicine

## 2022-09-05 VITALS — BP 89/63 | HR 68 | Temp 97.9°F | Ht 63.0 in | Wt 144.2 lb

## 2022-09-05 DIAGNOSIS — E782 Mixed hyperlipidemia: Secondary | ICD-10-CM | POA: Diagnosis not present

## 2022-09-05 DIAGNOSIS — G301 Alzheimer's disease with late onset: Secondary | ICD-10-CM | POA: Diagnosis not present

## 2022-09-05 DIAGNOSIS — I2510 Atherosclerotic heart disease of native coronary artery without angina pectoris: Secondary | ICD-10-CM

## 2022-09-05 DIAGNOSIS — F02A0 Dementia in other diseases classified elsewhere, mild, without behavioral disturbance, psychotic disturbance, mood disturbance, and anxiety: Secondary | ICD-10-CM | POA: Diagnosis not present

## 2022-09-05 DIAGNOSIS — I10 Essential (primary) hypertension: Secondary | ICD-10-CM

## 2022-09-05 DIAGNOSIS — E039 Hypothyroidism, unspecified: Secondary | ICD-10-CM | POA: Diagnosis not present

## 2022-09-05 MED ORDER — RIVASTIGMINE TARTRATE 3 MG PO CAPS: 3.0000 mg | ORAL_CAPSULE | Freq: Two times a day (BID) | ORAL | 3 refills | Status: DC

## 2022-09-05 NOTE — Progress Notes (Signed)
Subjective:  Patient ID: Frederick Davis, male    DOB: August 08, 1923  Age: 88 y.o. MRN: [id number]  CC: Medical Management of Chronic Issues   HPI Frederick Davis [facility] presents for  follow-up on  thyroid. The patient has a history of hypothyroidism for many years. It has been stable recently. Pt. denies any change in  voice, loss of hair, heat or cold intolerance. Energy level has been adequate to good. Patient denies constipation and diarrhea. No myxedema. Medication is as noted below. Verified that pt is taking it daily on an empty stomach. Well tolerated.  Vision better. Started on [place] for dry eyes.   Using exelon. Wife can tell he is better when he takes it. None of the problems hehad with donepezil.     09/05/2022    1:00 PM 09/05/2022   12:57 PM 06/06/2022    1:53 PM  Depression screen PHQ 2/9  Decreased Interest 0 0 0  Down, Depressed, Hopeless 0  0  PHQ - 2 Score 0 0 0    History Frederick Davis has a past medical history of Anemia, Aortic stenosis, Arthritis, CAD (coronary artery disease), Chronic renal insufficiency, stage III (moderate) (HCC), Dyspnea, Esophageal stricture (YRS AGO), Hypertension, Hypothyroidism, Kidney stone (1969), MI (myocardial infarction) ([place]) (2017), Mitral regurgitation, Myocardial infarction ([place]) (03/12/2013), Osteoporosis, PC (prostate cancer) ([place]) (1987), RBBB, Rectum cancer ([place]) (1986), and Renal cell cancer ([place]) (2005).   He has a past surgical history that includes Kidney surgery (Right, 2004); Radiofrequency ablation kidney (2006); renal sphincter appliance (2007); Colonoscopy (01/08/2012); Colostomy (Left, 1986); Percutaneous coronary intervention-balloon only; left heart catheterization with coronary angiogram (N/A, 03/12/2013); Prostatectomy; Colonoscopy (N/A, 07/20/2015); Cardiac catheterization (N/A, 12/06/2015); Coronary artery bypass graft (N/A, 12/08/2015); TEE without cardioversion (N/A, 12/08/2015); Hernia repair; Removal of penile prosthesis (N/A,  12/15/2017); and Removal of penile prosthesis (N/A, 02/23/2018).   His family history includes AAA (abdominal aortic aneurysm) (age of onset: 79) in his father; Arthritis in his father; Cancer in his son; Hypertension in his son; Parkinson's disease in his sister; Stroke (age of onset: 64) in his mother.He reports that he quit smoking about 53 years ago. His smoking use included cigarettes. He started smoking about 75 years ago. He has a 5.00 pack-year smoking history. He has never been exposed to tobacco smoke. He has never used smokeless tobacco. He reports current alcohol use of about 1.0 standard drink of alcohol per week. He reports that he does not use drugs.    ROS Review of Systems  Constitutional:  Negative for fever.  Respiratory:  Negative for shortness of breath.   Cardiovascular:  Negative for chest pain.  Musculoskeletal:  Negative for arthralgias.  Skin:  Negative for rash.    Objective:  BP (!) 89/63   Pulse 68   Temp 97.9 F (36.6 C)   Ht _0  (1.6 m)   Wt 144 lb 3.2 oz (65.4 kg)   SpO2 98%   BMI 25.54 kg/m   BP Readings from Last 3 Encounters:  09/05/22 (!) 89/63  09/03/22 125/84  08/28/22 108/60    Wt Readings from Last 3 Encounters:  09/05/22 144 lb 3.2 oz (65.4 kg)  09/03/22 144 lb (65.3 kg)  08/28/22 144 lb 9.6 oz (65.6 kg)     Physical Exam Vitals reviewed.  Constitutional:      Appearance: He is well-developed.  HENT:     Head: Normocephalic and atraumatic.     Right Ear: External ear normal.     Left Ear:  External ear normal.     Mouth/Throat:     Pharynx: No oropharyngeal exudate or posterior oropharyngeal erythema.  Eyes:     Pupils: Pupils are equal, round, and reactive to light.  Cardiovascular:     Rate and Rhythm: Normal rate and regular rhythm.     Heart sounds: No murmur heard. Pulmonary:     Effort: No respiratory distress.     Breath sounds: Normal breath sounds.  Musculoskeletal:     Cervical back: Normal range of motion and  neck supple.  Neurological:     Mental Status: He is alert and oriented to person, place, and time.       Assessment & Plan:   Winson was seen today for medical management of chronic issues.  Diagnoses and all orders for this visit:  Mild late onset Alzheimer's dementia without behavioral disturbance, psychotic disturbance, mood disturbance, or anxiety (HCC)  Hypothyroidism, unspecified type -     TSH + free T4  Essential hypertension, benign -     CBC with Differential/Platelet -     CMP14+EGFR  Mixed hyperlipidemia -     Lipid panel  Other orders -     rivastigmine (EXELON) 3 MG capsule; Take 1 capsule (3 mg total) by mouth 2 (two) times daily.       I have changed [name]. [place]'s rivastigmine. I am also having him maintain his multivitamin, lipase/protease/amylase, Multiple Vitamins-Minerals (ZINC PO), nitroGLYCERIN, aspirin EC, torsemide, levothyroxine, and cycloSPORINE.  Allergies as of 09/05/2022       Reactions   Donepezil Other (See Comments)   Confusion, hostility   Lipitor [atorvastatin] Other (See Comments)   Muscle aches        Medication List        Accurate as of September 05, 2022  1:46 PM. If you have any questions, ask your nurse or doctor.          aspirin EC 81 MG tablet Take 1 tablet (81 mg total) by mouth daily. Swallow whole.   cycloSPORINE 0.05 % ophthalmic emulsion Commonly known as: RESTASIS Place 1 drop into both eyes 2 (two) times daily.   levothyroxine 88 MCG tablet Commonly known as: SYNTHROID Take 1 tablet (88 mcg total) by mouth daily.   lipase/protease/amylase 36000 UNITS Cpep capsule Commonly known as: CREON 36,000 Units 3 (three) times daily with meals.   multivitamin capsule Take 1 capsule daily by mouth.   nitroGLYCERIN 0.4 MG SL tablet Commonly known as: NITROSTAT PLACE 1 TABLET UNDER THE TONGUE AT ONSET OF CHEST PAIN EVERY 5 MINTUES UP TO 3 TIMES AS NEEDED   rivastigmine 3 MG capsule Commonly  known as: EXELON Take 1 capsule (3 mg total) by mouth 2 (two) times daily. What changed:  medication strength how much to take Changed by: Claretta Fraise, MD   torsemide 20 MG tablet Commonly known as: DEMADEX Take 1 tablet (20 mg total) by mouth daily.   ZINC PO Take by mouth at bedtime.         Follow-up: No follow-ups on file.  Claretta Fraise, [name]

## 2022-09-06 LAB — CMP14+EGFR
ALT: 14 IU/L (ref 0–44)
AST: 19 IU/L (ref 0–40)
Albumin/Globulin Ratio: 1.6 (ref 1.2–2.2)
Albumin: 3.8 g/dL (ref 3.6–4.6)
Alkaline Phosphatase: 108 IU/L (ref 44–121)
BUN/Creatinine Ratio: 18 (ref 10–24)
BUN: 30 mg/dL (ref 10–36)
Bilirubin Total: 0.6 mg/dL (ref 0.0–1.2)
CO2: 24 mmol/L (ref 20–29)
Calcium: 9.9 mg/dL (ref 8.6–10.2)
Chloride: 102 mmol/L (ref 96–106)
Creatinine, Ser: 1.66 mg/dL — ABNORMAL HIGH (ref 0.76–1.27)
Globulin, Total: 2.4 g/dL (ref 1.5–4.5)
Glucose: 99 mg/dL (ref 70–99)
Potassium: 4.7 mmol/L (ref 3.5–5.2)
Sodium: 142 mmol/L (ref 134–144)
Total Protein: 6.2 g/dL (ref 6.0–8.5)
eGFR: 37 mL/min/{1.73_m2} — ABNORMAL LOW (ref >59)

## 2022-09-06 LAB — LIPID PANEL
Chol/HDL Ratio: 1.9 ratio (ref 0.0–5.0)
Cholesterol, Total: 124 mg/dL (ref 100–199)
HDL: 66 mg/dL (ref >39)
LDL Chol Calc (NIH): 41 mg/dL (ref 0–99)
Triglycerides: 88 mg/dL (ref 0–149)
VLDL Cholesterol Cal: 17 mg/dL (ref 5–40)

## 2022-09-06 LAB — CBC WITH DIFFERENTIAL/PLATELET
Basophils Absolute: 0.1 10*3/uL (ref 0.0–0.2)
Basos: 1 %
EOS (ABSOLUTE): 0.5 10*3/uL — ABNORMAL HIGH (ref 0.0–0.4)
Eos: 6 %
Hematocrit: 35.5 % — ABNORMAL LOW (ref 37.5–51.0)
Hemoglobin: 10.9 g/dL — ABNORMAL LOW (ref 13.0–17.7)
Immature Grans (Abs): 0 10*3/uL (ref 0.0–0.1)
Immature Granulocytes: 0 %
Lymphocytes Absolute: 1.7 10*3/uL (ref 0.7–3.1)
Lymphs: 20 %
MCH: 24.8 pg — ABNORMAL LOW (ref 26.6–33.0)
MCHC: 30.7 g/dL — ABNORMAL LOW (ref 31.5–35.7)
MCV: 81 fL (ref 79–97)
Monocytes Absolute: 0.7 10*3/uL (ref 0.1–0.9)
Monocytes: 8 %
Neutrophils Absolute: 5.4 10*3/uL (ref 1.4–7.0)
Neutrophils: 65 %
Platelets: 232 10*3/uL (ref 150–450)
RBC: 4.39 x10E6/uL (ref 4.14–5.80)
RDW: 15 % (ref 11.6–15.4)
WBC: 8.3 10*3/uL (ref 3.4–10.8)

## 2022-09-06 LAB — TSH+FREE T4
Free T4: 1.3 ng/dL (ref 0.82–1.77)
TSH: 2.53 u[IU]/mL (ref 0.450–4.500)

## 2022-09-08 NOTE — Progress Notes (Signed)
Hello Mackson,  Your lab result is normal and/or stable.Some minor variations that are not significant are commonly marked abnormal, but do not represent any medical problem for you.  Best regards, Nic Lampe, [name]

## 2022-09-26 ENCOUNTER — Other Ambulatory Visit: Payer: Self-pay | Admitting: Family Medicine

## 2022-09-26 DIAGNOSIS — E039 Hypothyroidism, unspecified: Secondary | ICD-10-CM

## 2022-12-09 ENCOUNTER — Encounter: Payer: Self-pay | Admitting: Family Medicine

## 2022-12-09 ENCOUNTER — Ambulatory Visit (INDEPENDENT_AMBULATORY_CARE_PROVIDER_SITE_OTHER): Payer: Medicare PPO | Admitting: Family Medicine

## 2022-12-09 VITALS — BP 114/71 | HR 71 | Temp 97.7°F | Ht 63.0 in | Wt 145.6 lb

## 2022-12-09 DIAGNOSIS — E038 Other specified hypothyroidism: Secondary | ICD-10-CM | POA: Diagnosis not present

## 2022-12-09 NOTE — Progress Notes (Signed)
Subjective:  Patient ID: Frederick Davis, male    DOB: 07-07-1923  Age: 89 y.o. MRN: [id number]  CC: Medical Management of Chronic Issues   HPI Frederick Davis [facility] presents for  follow-up on  thyroid. The patient has a history of hypothyroidism for many years. It has been stable recently. Pt. denies any change in  voice, loss of hair, heat or cold intolerance. Energy level has been adequate to good. Patient denies constipation and diarrhea. No myxedema. Medication is as noted below. Verified that pt is taking it daily on an empty stomach. Well tolerated.  Pt. Also forgetful. Doing well, however with exelon. Forgets the county and the date today. Remembers most otherwise.     12/09/2022   11:39 AM 12/09/2022   11:19 AM 09/05/2022    1:00 PM  Depression screen PHQ 2/9  Decreased Interest 1 0 0  Down, Depressed, Hopeless 1 0 0  PHQ - 2 Score 2 0 0  Altered sleeping 1    Tired, decreased energy 1    Change in appetite 3    Feeling bad or failure about yourself  0    Trouble concentrating 0    Moving slowly or fidgety/restless 3    Suicidal thoughts 0    PHQ-9 Score 10    Difficult doing work/chores Somewhat difficult      History Frederick Davis has a past medical history of Anemia, Aortic stenosis, Arthritis, CAD (coronary artery disease), Chronic renal insufficiency, stage III (moderate) (HCC), Dyspnea, Esophageal stricture (YRS AGO), Hypertension, Hypothyroidism, Kidney stone (1969), MI (myocardial infarction) ([place]) (2017), Mitral regurgitation, Myocardial infarction ([place]) (03/12/2013), Osteoporosis, PC (prostate cancer) ([place]) (1987), RBBB, Rectum cancer ([place]) (1986), and Renal cell cancer ([place]) (2005).   He has a past surgical history that includes Kidney surgery (Right, 2004); Radiofrequency ablation kidney (2006); renal sphincter appliance (2007); Colonoscopy (01/08/2012); Colostomy (Left, 1986); Percutaneous coronary intervention-balloon only; left heart catheterization with coronary  angiogram (N/A, 03/12/2013); Prostatectomy; Colonoscopy (N/A, 07/20/2015); Cardiac catheterization (N/A, 12/06/2015); Coronary artery bypass graft (N/A, 12/08/2015); TEE without cardioversion (N/A, 12/08/2015); Hernia repair; Removal of penile prosthesis (N/A, 12/15/2017); and Removal of penile prosthesis (N/A, 02/23/2018).   His family history includes AAA (abdominal aortic aneurysm) (age of onset: 69) in his father; Arthritis in his father; Cancer in his son; Hypertension in his son; Parkinson's disease in his sister; Stroke (age of onset: 63) in his mother.He reports that he quit smoking about 54 years ago. His smoking use included cigarettes. He started smoking about 76 years ago. He has a 5.00 pack-year smoking history. He has never been exposed to tobacco smoke. He has never used smokeless tobacco. He reports current alcohol use of about 1.0 standard drink of alcohol per week. He reports that he does not use drugs.    ROS Review of Systems  Constitutional:  Negative for fever.  Respiratory:  Negative for shortness of breath.   Cardiovascular:  Negative for chest pain.  Musculoskeletal:  Negative for arthralgias.  Skin:  Negative for rash.    Objective:  BP 114/71   Pulse 71   Temp 97.7 F (36.5 C)   Ht 5' 3"$  (1.6 m)   Wt 145 lb 9.6 oz (66 kg)   SpO2 98%   BMI 25.79 kg/m   BP Readings from Last 3 Encounters:  12/09/22 114/71  09/05/22 (!) 89/63  09/03/22 125/84    Wt Readings from Last 3 Encounters:  12/09/22 145 lb 9.6 oz (66 kg)  09/05/22 144 lb 3.2 oz (  65.4 kg)  09/03/22 144 lb (65.3 kg)     Physical Exam Vitals reviewed.  Constitutional:      Appearance: He is well-developed.  HENT:     Head: Normocephalic and atraumatic.     Right Ear: External ear normal.     Left Ear: External ear normal.     Mouth/Throat:     Pharynx: No oropharyngeal exudate or posterior oropharyngeal erythema.  Eyes:     Pupils: Pupils are equal, round, and reactive to light.  Cardiovascular:      Rate and Rhythm: Normal rate and regular rhythm.     Heart sounds: No murmur heard. Pulmonary:     Effort: No respiratory distress.     Breath sounds: Normal breath sounds.  Musculoskeletal:     Cervical back: Normal range of motion and neck supple.  Neurological:     Mental Status: He is alert and oriented to person, place, and time.       Assessment & Plan:   Enes was seen today for medical management of chronic issues.  Diagnoses and all orders for this visit:  Other specified hypothyroidism -     TSH + free T4 -     CBC with Differential/Platelet -     CMP14+EGFR       I am having Frederick Davis. Ace maintain his multivitamin, lipase/protease/amylase, Multiple Vitamins-Minerals (ZINC PO), nitroGLYCERIN, aspirin EC, torsemide, cycloSPORINE, rivastigmine, and levothyroxine.  Allergies as of 12/09/2022       Reactions   Donepezil Other (See Comments)   Confusion, hostility   Lipitor [atorvastatin] Other (See Comments)   Muscle aches        Medication List        Accurate as of December 09, 2022 12:01 PM. If you have any questions, ask your nurse or doctor.          aspirin EC 81 MG tablet Take 1 tablet (81 mg total) by mouth daily. Swallow whole.   cycloSPORINE 0.05 % ophthalmic emulsion Commonly known as: RESTASIS Place 1 drop into both eyes 2 (two) times daily.   levothyroxine 88 MCG tablet Commonly known as: SYNTHROID TAKE 1 TABLET DAILY   lipase/protease/amylase 36000 UNITS Cpep capsule Commonly known as: CREON 36,000 Units 3 (three) times daily with meals.   multivitamin capsule Take 1 capsule daily by mouth.   nitroGLYCERIN 0.4 MG SL tablet Commonly known as: NITROSTAT PLACE 1 TABLET UNDER THE TONGUE AT ONSET OF CHEST PAIN EVERY 5 MINTUES UP TO 3 TIMES AS NEEDED   rivastigmine 3 MG capsule Commonly known as: EXELON Take 1 capsule (3 mg total) by mouth 2 (two) times daily.   torsemide 20 MG tablet Commonly known as:  DEMADEX Take 1 tablet (20 mg total) by mouth daily.   ZINC PO Take by mouth at bedtime.         Follow-up: No follow-ups on file.  Claretta Fraise, [name]

## 2022-12-10 LAB — CBC WITH DIFFERENTIAL/PLATELET
Basophils Absolute: 0.1 10*3/uL (ref 0.0–0.2)
Basos: 1 %
EOS (ABSOLUTE): 0.5 10*3/uL — ABNORMAL HIGH (ref 0.0–0.4)
Eos: 5 %
Hematocrit: 35.1 % — ABNORMAL LOW (ref 37.5–51.0)
Hemoglobin: 11.3 g/dL — ABNORMAL LOW (ref 13.0–17.7)
Immature Grans (Abs): 0 10*3/uL (ref 0.0–0.1)
Immature Granulocytes: 1 %
Lymphocytes Absolute: 1.6 10*3/uL (ref 0.7–3.1)
Lymphs: 18 %
MCH: 26.7 pg (ref 26.6–33.0)
MCHC: 32.2 g/dL (ref 31.5–35.7)
MCV: 83 fL (ref 79–97)
Monocytes Absolute: 0.6 10*3/uL (ref 0.1–0.9)
Monocytes: 7 %
Neutrophils Absolute: 5.9 10*3/uL (ref 1.4–7.0)
Neutrophils: 68 %
Platelets: 233 10*3/uL (ref 150–450)
RBC: 4.23 x10E6/uL (ref 4.14–5.80)
RDW: 14.8 % (ref 11.6–15.4)
WBC: 8.7 10*3/uL (ref 3.4–10.8)

## 2022-12-10 LAB — CMP14+EGFR
ALT: 11 IU/L (ref 0–44)
AST: 18 IU/L (ref 0–40)
Albumin/Globulin Ratio: 1.5 (ref 1.2–2.2)
Albumin: 3.8 g/dL (ref 3.6–4.6)
Alkaline Phosphatase: 99 IU/L (ref 44–121)
BUN/Creatinine Ratio: 27 — ABNORMAL HIGH (ref 10–24)
BUN: 44 mg/dL — ABNORMAL HIGH (ref 10–36)
Bilirubin Total: 0.6 mg/dL (ref 0.0–1.2)
CO2: 20 mmol/L (ref 20–29)
Calcium: 9.9 mg/dL (ref 8.6–10.2)
Chloride: 101 mmol/L (ref 96–106)
Creatinine, Ser: 1.65 mg/dL — ABNORMAL HIGH (ref 0.76–1.27)
Globulin, Total: 2.5 g/dL (ref 1.5–4.5)
Glucose: 78 mg/dL (ref 70–99)
Potassium: 4.5 mmol/L (ref 3.5–5.2)
Sodium: 139 mmol/L (ref 134–144)
Total Protein: 6.3 g/dL (ref 6.0–8.5)
eGFR: 37 mL/min/{1.73_m2} — ABNORMAL LOW (ref >59)

## 2022-12-10 LAB — TSH+FREE T4
Free T4: 1.39 ng/dL (ref 0.82–1.77)
TSH: 1.82 u[IU]/mL (ref 0.450–4.500)

## 2022-12-10 NOTE — Progress Notes (Signed)
Hello Frederick Davis,  Your lab result is normal and/or stable.Some minor variations that are not significant are commonly marked abnormal, but do not represent any medical problem for you.  Best regards, Claretta Fraise, [name]

## 2023-02-23 NOTE — Progress Notes (Unsigned)
Cardiology Office Note:   Date:  02/26/2023  ID:  Frederick Davis, DOB 04-Jul-1923, MRN [id number]  History of Present Illness:   Frederick Davis is a 89 y.o. male who for followup of CAD.  He underwent a CABG 3 on February 2017.   He was admitted with syncopal episode related to aortic valve stenosis and new onset atrial fibrillation.  He has remained rate controlled with his carvedilol, but given his high CHA2DS2-VASc score of 4 he was recommended to start on Eliquis.  He has had 2D echocardiogram with LVEF 55% and no wall motion abnormalities noted.  This was in 2022.    In March he thought he was going to die and his wife got hospice involved.  He stopped eating and taking his meds.  He was off for about 2 months.  He then started eating more but very recently started having some increased lower extremity swelling.  This was left greater than right.  It swollen and somewhat acutely and a little bit uncomfortable and his wife talk to the hospice physician who is never really met him and Keflex was prescribed although they have not started this yet.  They just picked up the prescription.  He is being seen by nurses a couple of times a week.  He is progressively more frail but he is denying any acute shortness of breath and is not having any PND or orthopnea.  He is not having any pain or chest pressure.  He has had no new palpitations.  He gets around slowly with his walker.  His feet are on the ground a fair amount.  ROS: As stated in the HPI and negative for all other systems.  Studies Reviewed:    EKG:  NA    Risk Assessment/Calculations:    CHA2DS2-VASc Score = 4   This indicates a 4.8% annual risk of stroke. The patient's score is based upon: CHF History: 0 HTN History: 1 Diabetes History: 0 Stroke History: 0 Vascular Disease History: 1 Age Score: 2 Gender Score: 0  Physical Exam:   VS:  BP 110/70   Pulse (!) 56   Ht 5\' 3"  (1.6 m)   Wt 130 lb (59 kg)   BMI 23.03 kg/m     Wt Readings from Last 3 Encounters:  02/26/23 130 lb (59 kg)  12/09/22 145 lb 9.6 oz (66 kg)  09/05/22 144 lb 3.2 oz (65.4 kg)     GEN: Very frail  NECK:  No jugular venous distention at 90 degrees, waveform within normal limits, carotid upstroke brisk and symmetric, no bruits, no thyromegaly LYMPHATICS:  No cervical adenopathy LUNGS:  Clear to auscultation bilaterally BACK:  No CVA tenderness CHEST:  Unremarkable HEART:  S1 and S2 within normal limits, no S3, no clicks, no rubs, no murmurs, irregular ABD:  Positive bowel sounds normal in frequency in pitch, no bruits, no rebound, no guarding, unable to assess midline mass or bruit with the patient seated.  Ostomy bag EXT:  2 plus pulses throughout, moderate left greater than right foot and leg edema, no cyanosis no clubbing.  Mild erythema on the dorsum of the left foot SKIN:  No rashes no nodules NEURO:  Cranial nerves II through XII grossly intact, motor grossly intact throughout PSYCH:  Cognitively intact, oriented to person place and time, decreased memory   ASSESSMENT AND PLAN:   ATRIAL FIB:   He is not an anticoagulation candidate for watchman candidate.  No change in therapy.  CAD/CABG:    He has no new symptoms.  No change in therapy.  Conservative management.  HTN:   His blood pressure is at target.  No change in therapy.    AORTIC STENOSIS:    This is severe and he is not a candidate for intervention so we are managing this conservatively.   CKD IIIB:   Minimize blood draws and travel.  Creatinine was stable serially checking it routinely.    LEG SWELLING:    This probably is worse because he been off of his diuretic for a while but this was all restarted along with other medications in the last couple of days.  I am going to have him take an extra 20 mg of torsemide for 2 days.  I would like him to look into getting an easy chair so he can keep his feet up better.  I do not see any open wounds but there is some erythema  on the left leg greater than right.  I said it is okay to go ahead and take the Keflex that was prescribed.          Signed, Rollene Rotunda, MD

## 2023-02-26 ENCOUNTER — Ambulatory Visit (INDEPENDENT_AMBULATORY_CARE_PROVIDER_SITE_OTHER): Admitting: Cardiology

## 2023-02-26 ENCOUNTER — Encounter: Payer: Self-pay | Admitting: Cardiology

## 2023-02-26 VITALS — BP 110/70 | HR 56 | Ht 63.0 in | Wt 130.0 lb

## 2023-02-26 DIAGNOSIS — I350 Nonrheumatic aortic (valve) stenosis: Secondary | ICD-10-CM | POA: Diagnosis not present

## 2023-02-26 DIAGNOSIS — M7989 Other specified soft tissue disorders: Secondary | ICD-10-CM

## 2023-02-26 DIAGNOSIS — I2510 Atherosclerotic heart disease of native coronary artery without angina pectoris: Secondary | ICD-10-CM | POA: Diagnosis not present

## 2023-02-26 DIAGNOSIS — E785 Hyperlipidemia, unspecified: Secondary | ICD-10-CM

## 2023-02-26 DIAGNOSIS — N1832 Chronic kidney disease, stage 3b: Secondary | ICD-10-CM

## 2023-02-26 DIAGNOSIS — I482 Chronic atrial fibrillation, unspecified: Secondary | ICD-10-CM

## 2023-02-26 NOTE — Patient Instructions (Signed)
Medication Instructions:  Take extra dose of Torsemide for 2 days then return to normal dose. Continue all other medications as listed  *If you need a refill on your cardiac medications before your next appointment, please call your pharmacy*  Follow-Up: At [facility], you and your health needs are our priority.  As part of our continuing mission to provide you with exceptional heart care, we have created designated Provider Care Teams.  These Care Teams include your primary Cardiologist (physician) and Advanced Practice Providers (APPs -  Physician Assistants and Nurse Practitioners) who all work together to provide you with the care you need, when you need it.  We recommend signing up for the patient portal called "MyChart".  Sign up information is provided on this After Visit Summary.  MyChart is used to connect with patients for Virtual Visits (Telemedicine).  Patients are able to view lab/test results, encounter notes, upcoming appointments, etc.  Non-urgent messages can be sent to your provider as well.   To learn more about what you can do with MyChart, go to [email or web address].    Your next appointment:   6 months with Dr Antoine Poche or before as needed.

## 2023-03-10 ENCOUNTER — Ambulatory Visit: Payer: Medicare PPO

## 2023-04-09 ENCOUNTER — Ambulatory Visit: Payer: Medicare PPO | Admitting: Family Medicine

## 2023-04-28 ENCOUNTER — Telehealth: Payer: Self-pay | Admitting: Family Medicine

## 2023-04-28 NOTE — Telephone Encounter (Signed)
PCP aware, death certificate is on [place] [employer].

## 2023-05-22 DEATH — deceased
